# Patient Record
Sex: Female | Born: 1955 | Race: Black or African American | Hispanic: No | Marital: Married | Smoking: Current every day smoker
Health system: Southern US, Community
[De-identification: ages and names within clinical notes are randomized; demographics above are authoritative.]

## PROBLEM LIST (undated history)

## (undated) DIAGNOSIS — F17200 Nicotine dependence, unspecified, uncomplicated: Secondary | ICD-10-CM

## (undated) DIAGNOSIS — D649 Anemia, unspecified: Secondary | ICD-10-CM

## (undated) DIAGNOSIS — C3490 Malignant neoplasm of unspecified part of unspecified bronchus or lung: Secondary | ICD-10-CM

## (undated) DIAGNOSIS — J984 Other disorders of lung: Secondary | ICD-10-CM

## (undated) DIAGNOSIS — E871 Hypo-osmolality and hyponatremia: Secondary | ICD-10-CM

## (undated) DIAGNOSIS — I671 Cerebral aneurysm, nonruptured: Secondary | ICD-10-CM

## (undated) DIAGNOSIS — C801 Malignant (primary) neoplasm, unspecified: Secondary | ICD-10-CM

## (undated) DIAGNOSIS — I10 Essential (primary) hypertension: Secondary | ICD-10-CM

## (undated) DIAGNOSIS — N289 Disorder of kidney and ureter, unspecified: Secondary | ICD-10-CM

## (undated) DIAGNOSIS — B370 Candidal stomatitis: Secondary | ICD-10-CM

## (undated) DIAGNOSIS — R29898 Other symptoms and signs involving the musculoskeletal system: Secondary | ICD-10-CM

## (undated) DIAGNOSIS — L98499 Non-pressure chronic ulcer of skin of other sites with unspecified severity: Secondary | ICD-10-CM

## (undated) DIAGNOSIS — M25562 Pain in left knee: Secondary | ICD-10-CM

## (undated) DIAGNOSIS — Z9889 Other specified postprocedural states: Secondary | ICD-10-CM

## (undated) DIAGNOSIS — R197 Diarrhea, unspecified: Secondary | ICD-10-CM

## (undated) DIAGNOSIS — J439 Emphysema, unspecified: Secondary | ICD-10-CM

## (undated) DIAGNOSIS — L511 Stevens-Johnson syndrome: Secondary | ICD-10-CM

## (undated) DIAGNOSIS — M542 Cervicalgia: Secondary | ICD-10-CM

## (undated) DIAGNOSIS — M889 Osteitis deformans of unspecified bone: Secondary | ICD-10-CM

## (undated) DIAGNOSIS — G8929 Other chronic pain: Secondary | ICD-10-CM

## (undated) HISTORY — PX: CHEST TUBE INSERTION: SHX231

## (undated) HISTORY — DX: Nicotine dependence, unspecified, uncomplicated: F17.200

## (undated) HISTORY — PX: TUBAL LIGATION: SHX77

## (undated) HISTORY — PX: COLONOSCOPY: SHX174

## (undated) HISTORY — PX: CEREBRAL ANEURYSM REPAIR: SHX164

## (undated) HISTORY — DX: Candidal stomatitis: B37.0

## (undated) HISTORY — DX: Non-pressure chronic ulcer of skin of other sites with unspecified severity: L98.499

## (undated) HISTORY — DX: Malignant (primary) neoplasm, unspecified: C80.1

## (undated) HISTORY — DX: Emphysema, unspecified: J43.9

## (undated) HISTORY — DX: Other chronic pain: G89.29

## (undated) HISTORY — DX: Cervicalgia: M54.2

---

## 2003-08-29 ENCOUNTER — Emergency Department (HOSPITAL_COMMUNITY): Admission: EM | Admit: 2003-08-29 | Discharge: 2003-08-29 | Payer: Self-pay | Admitting: Emergency Medicine

## 2004-08-31 ENCOUNTER — Emergency Department (HOSPITAL_COMMUNITY): Admission: EM | Admit: 2004-08-31 | Discharge: 2004-09-01 | Payer: Self-pay | Admitting: Emergency Medicine

## 2007-12-16 ENCOUNTER — Emergency Department (HOSPITAL_COMMUNITY): Admission: EM | Admit: 2007-12-16 | Discharge: 2007-12-17 | Payer: Self-pay | Admitting: Emergency Medicine

## 2008-08-31 ENCOUNTER — Emergency Department (HOSPITAL_COMMUNITY): Admission: EM | Admit: 2008-08-31 | Discharge: 2008-08-31 | Payer: Self-pay | Admitting: Emergency Medicine

## 2009-02-22 ENCOUNTER — Emergency Department (HOSPITAL_COMMUNITY): Admission: EM | Admit: 2009-02-22 | Discharge: 2009-02-22 | Payer: Self-pay | Admitting: Emergency Medicine

## 2009-08-29 ENCOUNTER — Emergency Department (HOSPITAL_COMMUNITY): Admission: EM | Admit: 2009-08-29 | Discharge: 2009-08-29 | Payer: Self-pay | Admitting: Emergency Medicine

## 2010-04-08 LAB — URINE MICROSCOPIC-ADD ON

## 2010-04-08 LAB — URINALYSIS, ROUTINE W REFLEX MICROSCOPIC
Glucose, UA: NEGATIVE mg/dL
Ketones, ur: NEGATIVE mg/dL
Specific Gravity, Urine: >1.03 — ABNORMAL HIGH (ref 1.005–1.030)
Urobilinogen, UA: 0.2 mg/dL (ref 0.0–1.0)

## 2010-04-30 LAB — URINALYSIS, ROUTINE W REFLEX MICROSCOPIC
Bilirubin Urine: NEGATIVE
Hgb urine dipstick: NEGATIVE
Nitrite: NEGATIVE
Protein, ur: NEGATIVE mg/dL
Specific Gravity, Urine: >1.03 — ABNORMAL HIGH (ref 1.005–1.030)
pH: 5.5 (ref 5.0–8.0)

## 2010-04-30 LAB — URINE MICROSCOPIC-ADD ON

## 2010-04-30 LAB — WET PREP, GENITAL: Yeast Wet Prep HPF POC: NONE SEEN

## 2010-04-30 LAB — GC/CHLAMYDIA PROBE AMP, GENITAL: GC Probe Amp, Genital: NEGATIVE

## 2010-09-06 ENCOUNTER — Emergency Department (HOSPITAL_COMMUNITY)
Admission: EM | Admit: 2010-09-06 | Discharge: 2010-09-06 | Disposition: A | Discharge status: Home / Self Care | Payer: Medicaid Other | Attending: Emergency Medicine | Admitting: Emergency Medicine

## 2010-09-06 ENCOUNTER — Emergency Department (HOSPITAL_COMMUNITY): Payer: Medicaid Other

## 2010-09-06 ENCOUNTER — Encounter: Payer: Self-pay | Admitting: *Deleted

## 2010-09-06 DIAGNOSIS — R51 Headache: Secondary | ICD-10-CM | POA: Insufficient documentation

## 2010-09-06 DIAGNOSIS — I10 Essential (primary) hypertension: Secondary | ICD-10-CM | POA: Insufficient documentation

## 2010-09-06 HISTORY — DX: Essential (primary) hypertension: I10

## 2010-09-06 HISTORY — DX: Cerebral aneurysm, nonruptured: I67.1

## 2010-09-06 HISTORY — DX: Other disorders of lung: J98.4

## 2010-09-06 MED ORDER — KETOROLAC TROMETHAMINE 60 MG/2ML IM SOLN
60.0000 mg | Freq: Once | INTRAMUSCULAR | Status: AC
  Administered 2010-09-06: 60 mg via INTRAMUSCULAR
  Filled 2010-09-06: qty 2

## 2010-09-06 MED ORDER — HYDROCODONE-ACETAMINOPHEN 5-325 MG PO TABS: 1.0000 | ORAL_TABLET | ORAL | Status: AC | PRN

## 2010-09-06 MED ORDER — ONDANSETRON HCL 4 MG PO TABS: 4.0000 mg | ORAL_TABLET | Freq: Four times a day (QID) | ORAL | Status: AC

## 2010-09-06 NOTE — ED Notes (Signed)
Crackers and drink given to pt 

## 2010-09-06 NOTE — ED Notes (Signed)
Discharge instructions given and reviewed with patient.  Prescriptions given for Zofran and Hydrocodone; effects and use explained.  Patient verbalized understanding of sedating effects of Hydrocodone.  Patient ambulatory; discharged home in good condition.

## 2010-09-06 NOTE — ED Notes (Signed)
Patient ambulatory to bathroom with steady gait.  States she feels a little better.

## 2010-09-06 NOTE — ED Provider Notes (Signed)
History  Scribed for Dr. Adriana Simas, the patient was seen in room 17. This chart was scribed by Hillery Hunter. This patient's care was started at 17:00.   CSN: [id number] Arrival date & time: 09/06/2010  4:23 PM  Chief Complaint  Patient presents with  . Migraine  . Hypertension   The history is provided by the patient.   Patient is a 55 year old female who presents to the ED with a right parietal headache. Patient describes the pain as waxing and waning and radiating diffusely around head. Patient reports that the headaches began after hitting her head when she got out of her car one month ago. The patient reports blurred vision, and occasional new difficultly walking but denies photophobia and nausea. Patient is taking baby aspirin at home for pain with mild improvement. Patients fiance reports that patient is behaving normally. PCP: [facility]  HPI ELEMENTS:  Location: right parietal  Onset: one month ago   Quality: shooting   Modifying factors: mild transient relief with ASA 81mg   Context: as above  Associated symptoms: as above   Past Medical History  Diagnosis Date  . Migraine   . Lung abnormality   . Hypertension   . Pleurisy   . Cerebral aneurysm 2 brain surgeries 96 or 97    Past Surgical History  Procedure Date  . Chest tube insertion   . Tubal ligation   . Cerebral aneurysm repair 96 or 97    History reviewed. No pertinent family history.  History  Substance Use Topics  . Smoking status: Current Everyday Smoker -- 0.5 packs/day  . Smokeless tobacco: Not on file  . Alcohol Use: No    Review of Systems  Constitutional: Negative for activity change.  Eyes: Positive for visual disturbance (blurry). Negative for photophobia.  Respiratory: Negative for shortness of breath.   Gastrointestinal: Negative for nausea and vomiting.  Musculoskeletal: Positive for gait problem.  Neurological: Negative for dizziness, syncope, facial asymmetry, speech  difficulty, weakness and numbness.  Psychiatric/Behavioral: Negative for behavioral problems and confusion.  All other systems reviewed and are negative.    Physical Exam  BP 194/100  Pulse 90  Temp(Src) 97.7 F (36.5 C) (Oral)  Resp 20  Ht 5' 1.5" (1.562 m)  Wt 149 lb (67.586 kg)  BMI 27.70 kg/m2  SpO2 100%  Physical Exam  Nursing note and vitals reviewed. Constitutional: She is oriented to person, place, and time. She appears well-developed and well-nourished. No distress.  HENT:  Head: Normocephalic.       Slightly sore in right parietal scalp   Eyes: EOM are normal. Pupils are equal, round, and reactive to light.  Neck: Neck supple.       Minimal cervical tenderness   Cardiovascular: Normal rate, regular rhythm, normal heart sounds and intact distal pulses.   Pulmonary/Chest: Effort normal and breath sounds normal.  Neurological: She is alert and oriented to person, place, and time. No cranial nerve deficit. Coordination normal.       Patient ambulated without gait difficulty and without light headedness   Skin: Skin is warm and dry.  Psychiatric: She has a normal mood and affect. Her behavior is normal. Thought content normal.    ED Course  Procedures   OTHER DATA REVIEWED: Nursing notes, vital signs reviewed.   DIAGNOSTIC STUDIES: Oxygen Saturation is 100% on room air, normal by my interpretation.     LABS / RADIOLOGY:   CT Brain: IMPRESSION:  1. Bitemporal craniotomies with mild prominence of  fluid density anteriorly in the middle cranial fossa bilaterally possibly reflecting mild encephalomalacia, and a suspected small remote infarct along the inferior margin of the head of the right caudate nucleus. Bilateral aneurysm clips are present. 2. No acute intracranial findings are observed.  Original Report Authenticated By: Dellia Cloud, M.D.   ED COURSE / COORDINATION OF CARE: 17:15. Ordered Toradol 60mg  IM for pain and CT Brain 19:24.  Patient ambulatory to bathroom with steady gait. States she feels a little better.  MDM:  I informed patient that I am not greatly concerned of intracranial findings considering her history and physical but due to the quality of these headaches being unusual for her, she requests a CT scan anyway. I anticipate discharge after patient shows improvement of pain with analgesic.   IMPRESSION: Diagnoses that have been ruled out:  Diagnoses that are still under consideration:  Final diagnoses:  Headache    PLAN:  Discharge home The patient is to return the emergency department if there is any worsening of symptoms. I have reviewed the discharge instructions with the patient   CONDITION ON DISCHARGE: Good   MEDICATIONS GIVEN IN THE E.D.  Medications  ketorolac (TORADOL) injection 60 mg (60 mg Intramuscular Given 09/06/10 1725)     DISCHARGE MEDICATIONS: New Prescriptions   HYDROCODONE-ACETAMINOPHEN (NORCO) 5-325 MG PER TABLET    Take 1-2 tablets by mouth every 4 (four) hours as needed for pain.   ONDANSETRON (ZOFRAN) 4 MG TABLET    Take 1 tablet (4 mg total) by mouth every 6 (six) hours.    Scribe Attestation  I personally performed the services described in this documentation, which was scribed in my presence. The recorded information has been reviewed and considered. No att. providers found    Donnetta Hutching, MD 09/06/10 2201

## 2010-09-06 NOTE — ED Notes (Signed)
Dr. Adriana Simas notified of bp and that pt still rates pain at 9.  Says is going to reevaluate pt.

## 2010-09-06 NOTE — ED Notes (Signed)
Pt states she has had headache for a long time and today worse. Pt states she has pain to right side of head and feels like it is behind right eye.

## 2012-03-14 ENCOUNTER — Emergency Department (HOSPITAL_COMMUNITY): Payer: Medicaid Other

## 2012-03-14 ENCOUNTER — Emergency Department (HOSPITAL_COMMUNITY)
Admission: EM | Admit: 2012-03-14 | Discharge: 2012-03-15 | Disposition: A | Discharge status: Home / Self Care | Payer: Medicaid Other | Attending: Emergency Medicine | Admitting: Emergency Medicine

## 2012-03-14 ENCOUNTER — Encounter (HOSPITAL_COMMUNITY): Payer: Self-pay | Admitting: Emergency Medicine

## 2012-03-14 DIAGNOSIS — R42 Dizziness and giddiness: Secondary | ICD-10-CM | POA: Insufficient documentation

## 2012-03-14 DIAGNOSIS — Z91199 Patient's noncompliance with other medical treatment and regimen due to unspecified reason: Secondary | ICD-10-CM | POA: Insufficient documentation

## 2012-03-14 DIAGNOSIS — R0602 Shortness of breath: Secondary | ICD-10-CM | POA: Insufficient documentation

## 2012-03-14 DIAGNOSIS — R110 Nausea: Secondary | ICD-10-CM | POA: Insufficient documentation

## 2012-03-14 DIAGNOSIS — K429 Umbilical hernia without obstruction or gangrene: Secondary | ICD-10-CM | POA: Insufficient documentation

## 2012-03-14 DIAGNOSIS — R51 Headache: Secondary | ICD-10-CM | POA: Insufficient documentation

## 2012-03-14 DIAGNOSIS — Z8739 Personal history of other diseases of the musculoskeletal system and connective tissue: Secondary | ICD-10-CM | POA: Insufficient documentation

## 2012-03-14 DIAGNOSIS — Z8709 Personal history of other diseases of the respiratory system: Secondary | ICD-10-CM | POA: Insufficient documentation

## 2012-03-14 DIAGNOSIS — I10 Essential (primary) hypertension: Secondary | ICD-10-CM | POA: Insufficient documentation

## 2012-03-14 DIAGNOSIS — F17200 Nicotine dependence, unspecified, uncomplicated: Secondary | ICD-10-CM | POA: Insufficient documentation

## 2012-03-14 HISTORY — DX: Osteitis deformans of unspecified bone: M88.9

## 2012-03-14 LAB — COMPREHENSIVE METABOLIC PANEL
ALT: 8 U/L (ref 0–35)
AST: 14 U/L (ref 0–37)
Albumin: 3.5 g/dL (ref 3.5–5.2)
Alkaline Phosphatase: 299 U/L — ABNORMAL HIGH (ref 39–117)
BUN: 16 mg/dL (ref 6–23)
CO2: 28 mEq/L (ref 19–32)
Calcium: 9 mg/dL (ref 8.4–10.5)
Chloride: 101 mEq/L (ref 96–112)
Glucose, Bld: 93 mg/dL (ref 70–99)
Total Bilirubin: 0.2 mg/dL — ABNORMAL LOW (ref 0.3–1.2)

## 2012-03-14 LAB — CBC WITH DIFFERENTIAL/PLATELET
Basophils Absolute: 0 10*3/uL (ref 0.0–0.1)
Eosinophils Absolute: 0.2 10*3/uL (ref 0.0–0.7)
Lymphs Abs: 3.4 10*3/uL (ref 0.7–4.0)
MCH: 30.6 pg (ref 26.0–34.0)
MCV: 90 fL (ref 78.0–100.0)
Monocytes Absolute: 0.7 10*3/uL (ref 0.1–1.0)
Neutrophils Relative %: 65 % (ref 43–77)
Platelets: 222 10*3/uL (ref 150–400)

## 2012-03-14 MED ORDER — SODIUM CHLORIDE 0.9 % IV BOLUS (SEPSIS)
1000.0000 mL | Freq: Once | INTRAVENOUS | Status: AC
  Administered 2012-03-14: 1000 mL via INTRAVENOUS

## 2012-03-14 MED ORDER — SODIUM CHLORIDE 0.9 % IV SOLN
Freq: Once | INTRAVENOUS | Status: AC
  Administered 2012-03-14: 20 mL/h via INTRAVENOUS

## 2012-03-14 MED ORDER — LISINOPRIL 20 MG PO TABS: 20.0000 mg | ORAL_TABLET | Freq: Every day | ORAL | Status: DC

## 2012-03-14 MED ORDER — ONDANSETRON HCL 4 MG/2ML IJ SOLN
4.0000 mg | Freq: Once | INTRAMUSCULAR | Status: AC
  Administered 2012-03-14: 4 mg via INTRAVENOUS
  Filled 2012-03-14: qty 2

## 2012-03-14 MED ORDER — HYDROMORPHONE HCL PF 1 MG/ML IJ SOLN
1.0000 mg | Freq: Once | INTRAMUSCULAR | Status: AC
  Administered 2012-03-14: 1 mg via INTRAVENOUS
  Filled 2012-03-14: qty 1

## 2012-03-14 MED ORDER — HYDROMORPHONE HCL PF 1 MG/ML IJ SOLN
0.5000 mg | Freq: Once | INTRAMUSCULAR | Status: AC
Start: 2012-03-14 — End: 2012-03-14
  Administered 2012-03-14: 0.5 mg via INTRAVENOUS
  Filled 2012-03-14: qty 1

## 2012-03-14 MED ORDER — LISINOPRIL 10 MG PO TABS
20.0000 mg | ORAL_TABLET | Freq: Once | ORAL | Status: AC
  Administered 2012-03-14: 20 mg via ORAL
  Filled 2012-03-14: qty 2

## 2012-03-14 MED ORDER — HYDROCODONE-ACETAMINOPHEN 5-325 MG PO TABS: 1.0000 | ORAL_TABLET | Freq: Four times a day (QID) | ORAL | Status: DC | PRN

## 2012-03-14 NOTE — ED Notes (Signed)
Ambulated to bathroom and then nauseated and vomiting again- identified pill particles in emesis.  Still c/o feeling very dizzy and has fele this way since this afternoon when she called EMS

## 2012-03-14 NOTE — ED Provider Notes (Signed)
History     CSN: [id number]  Arrival date & time 03/14/12  1949   First MD Initiated Contact with Patient 03/14/12 1952      Chief Complaint  Patient presents with  . Abdominal Pain  . Shortness of Breath    (Consider location/radiation/quality/duration/timing/severity/associated sxs/prior treatment) HPI  Past Medical History  Diagnosis Date  . Migraine   . Lung abnormality   . Hypertension   . Pleurisy   . Cerebral aneurysm 2 brain surgeries 96 or 97  . Paget disease of bone     Past Surgical History  Procedure Laterality Date  . Chest tube insertion    . Tubal ligation    . Cerebral aneurysm repair  96 or 97    History reviewed. No pertinent family history.  History  Substance Use Topics  . Smoking status: Current Every Day Smoker -- 0.50 packs/day  . Smokeless tobacco: Not on file  . Alcohol Use: No    OB History   Grav Para Term Preterm Abortions TAB SAB Ect Mult Living                  Review of Systems  Allergies  Review of patient's allergies indicates no known allergies.  Home Medications   Current Outpatient Rx  Name  Route  Sig  Dispense  Refill  . acetaminophen (TYLENOL) 500 MG tablet   Oral   Take 500 mg by mouth every 6 (six) hours as needed for pain.         Marland Kitchen HYDROcodone-acetaminophen (NORCO/VICODIN) 5-325 MG per tablet   Oral   Take 1 tablet by mouth every 6 (six) hours as needed for pain.   10 tablet   0   . lisinopril (PRINIVIL,ZESTRIL) 20 MG tablet   Oral   Take 1 tablet (20 mg total) by mouth daily.   30 tablet   0     BP 187/81  Pulse 72  Temp(Src) 98.2 F (36.8 C) (Oral)  Resp 16  Ht 5\' 1"  (1.549 m)  Wt 140 lb (63.504 kg)  BMI 26.47 kg/m2  SpO2 96%  Physical Exam  ED Course  Procedures (including critical care time)  Labs Reviewed  CBC WITH DIFFERENTIAL - Abnormal; Notable for the following:    WBC 12.2 (*)    RDW 16.1 (*)    Neutro Abs 7.9 (*)    All other components within normal limits   COMPREHENSIVE METABOLIC PANEL - Abnormal; Notable for the following:    Alkaline Phosphatase 299 (*)    Total Bilirubin 0.2 (*)    GFR calc non Af Amer 58 (*)    GFR calc Af Amer 68 (*)    All other components within normal limits   Dg Chest 2 View  03/14/2012  *RADIOLOGY REPORT*  Clinical Data: Shortness of breath with headache and hypertension.  CHEST - 2 VIEW  Comparison: None.  Findings: Cardiomegaly.  Calcified tortuous aorta.  No hilar or mediastinal contour abnormalities.  Clear lung fields.  No effusion or pneumothorax. Unusual appearance to T11 and L1 vertebrae with sclerosis, flattening, but no destruction, suggesting Paget's disease.  IMPRESSION: No active cardiopulmonary disease.  Cardiomegaly.  Probable Paget's disease.   Original Report Authenticated By: Davonna Belling, M.D.    Ct Head Wo Contrast  03/14/2012  *RADIOLOGY REPORT*  Clinical Data: Shortness of breath and dizziness.  History of cerebral aneurysm with two surgeries.  CT HEAD WITHOUT CONTRAST  Technique:  Contiguous axial images were obtained from the  base of the skull through the vertex without contrast.  Comparison: 09/06/2010.  Findings: There is no evidence for acute infarction, intracranial hemorrhage, mass lesion, hydrocephalus, or extra-axial fluid. Mild atrophy.  Remote bilateral caudate infarcts.  Encephalomalacia both temporal lobes.  Bilateral internal carotid artery aneurysm clips. Mild chronic microvascular ischemic change.  No skull fracture.  No acute sinus or mastoid disease.  Compared to 2012, a similar appearance is noted.  IMPRESSION: No acute findings.  No evidence for new subarachnoid hemorrhage or acute infarction.  Chronic changes as described related to bilateral aneurysm clipping and multiple old infarcts.   Original Report Authenticated By: Davonna Belling, M.D.      1. Headache   2. Hypertension       MDM         Benny Lennert, MD 03/14/12 2250

## 2012-03-14 NOTE — ED Notes (Signed)
Attempted to sit patient up - still having nausea.

## 2012-03-14 NOTE — ED Notes (Signed)
Has vomited twice in past 20 minutes.  No abdominal pain now - just nausea

## 2012-03-14 NOTE — ED Notes (Signed)
C/o dizziness at present, and now c/o left arm numbness

## 2012-03-14 NOTE — ED Notes (Signed)
MD at bedside. Dr. Zammit. 

## 2012-03-14 NOTE — ED Notes (Addendum)
Mild itching on both arms.  Patient thinks it is due to tape on her arm and BP cuff

## 2012-03-14 NOTE — ED Provider Notes (Signed)
History    This chart was scribed for Benny Lennert, MD by Gerlean Ren, ED Scribe. This patient was seen in room APA16A/APA16A and the patient's care was started at 7:56 PM    CSN: [id number]  Arrival date & time 03/14/12  1949   First MD Initiated Contact with Patient 03/14/12 1952      Chief Complaint  Patient presents with  . Abdominal Pain  . Shortness of Breath     The history is provided by the patient. No language interpreter was used.  Megan Sims is a 57 y.o. female with h/o HTN and cerebral anyeurism (1996, 1997) brought in by ambulance to the Emergency Department complaining of constant dizziness and nausea with sudden onset around 6:00 PM tonight during rest.  Associated shortness of breath.  Per EMS, pt's BP was 220/140 en route here.  Pt is supposed to be on BP medication but has not taken it in over a year because it "made her sick."  Pt also c/o a knot in her lower abdomen causing abdominal pain.   Pt is a current everyday smoker but denies alcohol use.   Past Medical History  Diagnosis Date  . Migraine   . Lung abnormality   . Hypertension   . Pleurisy   . Cerebral aneurysm 2 brain surgeries 96 or 97  . Paget disease of bone     Past Surgical History  Procedure Laterality Date  . Chest tube insertion    . Tubal ligation    . Cerebral aneurysm repair  96 or 97    History reviewed. No pertinent family history.  History  Substance Use Topics  . Smoking status: Current Every Day Smoker -- 0.50 packs/day  . Smokeless tobacco: Not on file  . Alcohol Use: No    No OB history provided.   Review of Systems  HENT: Negative for congestion, sinus pressure and ear discharge.   Eyes: Negative for discharge.  Respiratory: Positive for shortness of breath. Negative for cough.   Cardiovascular: Negative.        Hypertensive  Gastrointestinal: Positive for nausea and abdominal pain. Negative for vomiting and diarrhea.  Genitourinary: Negative for frequency and  hematuria.  Musculoskeletal: Negative for back pain.  Skin: Negative for rash.  Neurological: Positive for dizziness. Negative for seizures and headaches.  Psychiatric/Behavioral: Negative for hallucinations.    Allergies  Review of patient's allergies indicates no known allergies.  Home Medications   Current Outpatient Rx  Name  Route  Sig  Dispense  Refill  . aspirin EC 81 MG tablet   Oral   Take 162 mg by mouth once as needed. For pain            BP 198/98  Pulse 72  Temp(Src) 98.2 F (36.8 C) (Oral)  Ht 5\' 1"  (1.549 m)  Wt 140 lb (63.504 kg)  BMI 26.47 kg/m2  SpO2 98%  Physical Exam  Nursing note and vitals reviewed. Constitutional: She is oriented to person, place, and time. She appears well-developed.  HENT:  Head: Normocephalic and atraumatic.  Eyes: Conjunctivae and EOM are normal. No scleral icterus.  Neck: Neck supple. No thyromegaly present.  Cardiovascular: Normal rate and regular rhythm.  Exam reveals no gallop and no friction rub.   No murmur heard. Pulmonary/Chest: No stridor. She has no wheezes. She has no rales. She exhibits no tenderness.  Abdominal: Soft. She exhibits no distension. There is no tenderness. There is no rebound.  Small umbilical hernia  Musculoskeletal: Normal range of motion. She exhibits no edema.  Lymphadenopathy:    She has no cervical adenopathy.  Neurological: She is oriented to person, place, and time. Coordination normal.  Skin: No rash noted. No erythema.  Psychiatric: She has a normal mood and affect. Her behavior is normal.    ED Course  Procedures (including critical care time) DIAGNOSTIC STUDIES: Oxygen Saturation is 98% on room air, normal by my interpretation.    COORDINATION OF CARE: 7:59 PM- Patient informed of clinical course, understands medical decision-making process, and agrees with plan.  Ordered head CT w/o contrast, c-met, and CBC.  Results for orders placed during the hospital encounter of 03/14/12   CBC WITH DIFFERENTIAL      Result Value Range   WBC 12.2 (*) 4.0 - 10.5 K/uL   RBC 4.51  3.87 - 5.11 MIL/uL   Hemoglobin 13.8  12.0 - 15.0 g/dL   HCT 16.1  09.6 - 04.5 %   MCV 90.0  78.0 - 100.0 fL   MCH 30.6  26.0 - 34.0 pg   MCHC 34.0  30.0 - 36.0 g/dL   RDW 40.9 (*) 81.1 - 91.4 %   Platelets 222  150 - 400 K/uL   Neutrophils Relative 65  43 - 77 %   Neutro Abs 7.9 (*) 1.7 - 7.7 K/uL   Lymphocytes Relative 28  12 - 46 %   Lymphs Abs 3.4  0.7 - 4.0 K/uL   Monocytes Relative 6  3 - 12 %   Monocytes Absolute 0.7  0.1 - 1.0 K/uL   Eosinophils Relative 1  0 - 5 %   Eosinophils Absolute 0.2  0.0 - 0.7 K/uL   Basophils Relative 0  0 - 1 %   Basophils Absolute 0.0  0.0 - 0.1 K/uL  COMPREHENSIVE METABOLIC PANEL      Result Value Range   Sodium 137  135 - 145 mEq/L   Potassium 3.6  3.5 - 5.1 mEq/L   Chloride 101  96 - 112 mEq/L   CO2 28  19 - 32 mEq/L   Glucose, Bld 93  70 - 99 mg/dL   BUN 16  6 - 23 mg/dL   Creatinine, Ser 7.82  0.50 - 1.10 mg/dL   Calcium 9.0  8.4 - 95.6 mg/dL   Total Protein 7.0  6.0 - 8.3 g/dL   Albumin 3.5  3.5 - 5.2 g/dL   AST 14  0 - 37 U/L   ALT 8  0 - 35 U/L   Alkaline Phosphatase 299 (*) 39 - 117 U/L   Total Bilirubin 0.2 (*) 0.3 - 1.2 mg/dL   GFR calc non Af Amer 58 (*) >90 mL/min   GFR calc Af Amer 68 (*) >90 mL/min    Dg Chest 2 View  03/14/2012  *RADIOLOGY REPORT*  Clinical Data: Shortness of breath with headache and hypertension.  CHEST - 2 VIEW  Comparison: None.  Findings: Cardiomegaly.  Calcified tortuous aorta.  No hilar or mediastinal contour abnormalities.  Clear lung fields.  No effusion or pneumothorax. Unusual appearance to T11 and L1 vertebrae with sclerosis, flattening, but no destruction, suggesting Paget's disease.  IMPRESSION: No active cardiopulmonary disease.  Cardiomegaly.  Probable Paget's disease.   Original Report Authenticated By: Davonna Belling, M.D.    Ct Head Wo Contrast  03/14/2012  *RADIOLOGY REPORT*  Clinical Data:  Shortness of breath and dizziness.  History of cerebral aneurysm with two surgeries.  CT HEAD WITHOUT CONTRAST  Technique:  Contiguous axial images were obtained from the base of the skull through the vertex without contrast.  Comparison: 09/06/2010.  Findings: There is no evidence for acute infarction, intracranial hemorrhage, mass lesion, hydrocephalus, or extra-axial fluid. Mild atrophy.  Remote bilateral caudate infarcts.  Encephalomalacia both temporal lobes.  Bilateral internal carotid artery aneurysm clips. Mild chronic microvascular ischemic change.  No skull fracture.  No acute sinus or mastoid disease.  Compared to 2012, a similar appearance is noted.  IMPRESSION: No acute findings.  No evidence for new subarachnoid hemorrhage or acute infarction.  Chronic changes as described related to bilateral aneurysm clipping and multiple old infarcts.   Original Report Authenticated By: Davonna Belling, M.D.      No diagnosis found.    MDM    The chart was scribed for me under my direct supervision.  I personally performed the history, physical, and medical decision making and all procedures in the evaluation of this patient.Benny Lennert, MD 03/26/12 [phone]

## 2012-03-14 NOTE — ED Notes (Signed)
Patient complaining of abdominal pain and shortness of breath starting today. Per EMS patient b/p 220/140. Patient states she is supposed to be on blood pressure medications but hasn't taken any in over 1 yr.

## 2012-03-15 NOTE — ED Notes (Addendum)
Dr. Colon Branch In to talk with patient

## 2012-03-15 NOTE — ED Notes (Signed)
Family members sitting in room with patient.  Uncomfortable taking patient home at present.

## 2012-03-15 NOTE — ED Notes (Signed)
Home with son and daughter.  They state they will bring her back if she continues to be dizzy and vomiting.

## 2012-07-13 ENCOUNTER — Emergency Department (HOSPITAL_COMMUNITY)
Admission: EM | Admit: 2012-07-13 | Discharge: 2012-07-13 | Disposition: A | Discharge status: Home / Self Care | Payer: Medicaid Other | Attending: Emergency Medicine | Admitting: Emergency Medicine

## 2012-07-13 ENCOUNTER — Encounter (HOSPITAL_COMMUNITY): Payer: Self-pay | Admitting: *Deleted

## 2012-07-13 DIAGNOSIS — F17200 Nicotine dependence, unspecified, uncomplicated: Secondary | ICD-10-CM | POA: Insufficient documentation

## 2012-07-13 DIAGNOSIS — Z23 Encounter for immunization: Secondary | ICD-10-CM | POA: Insufficient documentation

## 2012-07-13 DIAGNOSIS — Z8709 Personal history of other diseases of the respiratory system: Secondary | ICD-10-CM | POA: Insufficient documentation

## 2012-07-13 DIAGNOSIS — I10 Essential (primary) hypertension: Secondary | ICD-10-CM | POA: Insufficient documentation

## 2012-07-13 DIAGNOSIS — Z8679 Personal history of other diseases of the circulatory system: Secondary | ICD-10-CM | POA: Insufficient documentation

## 2012-07-13 DIAGNOSIS — Y929 Unspecified place or not applicable: Secondary | ICD-10-CM | POA: Insufficient documentation

## 2012-07-13 DIAGNOSIS — Z8739 Personal history of other diseases of the musculoskeletal system and connective tissue: Secondary | ICD-10-CM | POA: Insufficient documentation

## 2012-07-13 DIAGNOSIS — W268XXA Contact with other sharp object(s), not elsewhere classified, initial encounter: Secondary | ICD-10-CM | POA: Insufficient documentation

## 2012-07-13 DIAGNOSIS — Y939 Activity, unspecified: Secondary | ICD-10-CM | POA: Insufficient documentation

## 2012-07-13 DIAGNOSIS — S91309A Unspecified open wound, unspecified foot, initial encounter: Secondary | ICD-10-CM | POA: Insufficient documentation

## 2012-07-13 DIAGNOSIS — L02619 Cutaneous abscess of unspecified foot: Secondary | ICD-10-CM | POA: Insufficient documentation

## 2012-07-13 DIAGNOSIS — Z79899 Other long term (current) drug therapy: Secondary | ICD-10-CM | POA: Insufficient documentation

## 2012-07-13 MED ORDER — LIDOCAINE HCL (PF) 1 % IJ SOLN
INTRAMUSCULAR | Status: AC
  Administered 2012-07-13: 5 mL
  Filled 2012-07-13: qty 5

## 2012-07-13 MED ORDER — TETANUS-DIPHTH-ACELL PERTUSSIS 5-2.5-18.5 LF-MCG/0.5 IM SUSP
0.5000 mL | Freq: Once | INTRAMUSCULAR | Status: AC
  Administered 2012-07-13: 0.5 mL via INTRAMUSCULAR
  Filled 2012-07-13 (×2): qty 0.5

## 2012-07-13 MED ORDER — LIDOCAINE HCL (PF) 1 % IJ SOLN
30.0000 mL | Freq: Once | INTRAMUSCULAR | Status: AC
  Administered 2012-07-13: 5 mL
  Filled 2012-07-13: qty 30

## 2012-07-13 MED ORDER — SULFAMETHOXAZOLE-TMP DS 800-160 MG PO TABS
1.0000 | ORAL_TABLET | Freq: Once | ORAL | Status: AC
  Administered 2012-07-13: 1 via ORAL
  Filled 2012-07-13: qty 1

## 2012-07-13 MED ORDER — SULFAMETHOXAZOLE-TMP DS 800-160 MG PO TABS: 1.0000 | ORAL_TABLET | Freq: Two times a day (BID) | ORAL | Status: DC

## 2012-07-13 NOTE — ED Notes (Signed)
Stepped on nail x 2 wks ago, c/o pain.  Last tetnus >10 yrs.

## 2012-07-13 NOTE — ED Provider Notes (Signed)
History    This chart was scribed for Lyanne Co, MD, by Frederik Pear, ED scribe. The patient was seen in room APA11/APA11 and the patient's care was started at 0754.    CSN: [id number]  Arrival date & time 07/13/12  4098   First MD Initiated Contact with Patient 07/13/12 [phone]      Chief Complaint  Patient presents with  . Foot Pain    (Consider location/radiation/quality/duration/timing/severity/associated sxs/prior treatment) The history is provided by the patient and medical records. No language interpreter was used.   HPI Comments: Megan Sims is a 57 y.o. female with a h/o of hypertension who presents to the Emergency Department complaining of constant, gradually worsening, moderate right foot pain that is aggravated with ambulation and alleviated by nothing that began suddenly two weeks ago when she stepped a nail. She has She reports that her tetanus has not been updated within the last 10 years. She states that she has not taken her daily dose of lisinopril yet today.   Past Medical History  Diagnosis Date  . Migraine   . Lung abnormality   . Hypertension   . Pleurisy   . Cerebral aneurysm 2 brain surgeries 96 or 97  . Paget disease of bone     Past Surgical History  Procedure Laterality Date  . Chest tube insertion    . Tubal ligation    . Cerebral aneurysm repair  96 or 97    No family history on file.  History  Substance Use Topics  . Smoking status: Current Every Day Smoker -- 0.50 packs/day    Types: Cigarettes  . Smokeless tobacco: Not on file  . Alcohol Use: No    OB History   Grav Para Term Preterm Abortions TAB SAB Ect Mult Living                  Review of Systems A complete 10 system review of systems was obtained and all systems are negative except as noted in the HPI and PMH.  Allergies  Review of patient's allergies indicates no known allergies.  Home Medications   Current Outpatient Rx  Name  Route  Sig  Dispense  Refill  .  acetaminophen (TYLENOL) 500 MG tablet   Oral   Take 500 mg by mouth every 6 (six) hours as needed for pain.         Marland Kitchen HYDROcodone-acetaminophen (NORCO/VICODIN) 5-325 MG per tablet   Oral   Take 1 tablet by mouth every 6 (six) hours as needed for pain.   10 tablet   0   . lisinopril (PRINIVIL,ZESTRIL) 20 MG tablet   Oral   Take 1 tablet (20 mg total) by mouth daily.   30 tablet   0     BP 217/109  Pulse 87  Temp(Src) 98 F (36.7 C) (Oral)  Resp 20  Ht 5\' 1"  (1.549 m)  Wt 135 lb (61.236 kg)  BMI 25.52 kg/m2  SpO2 96%  Physical Exam  Nursing note and vitals reviewed. Constitutional: She is oriented to person, place, and time. She appears well-developed and well-nourished. No distress.  HENT:  Head: Normocephalic and atraumatic.  Eyes: EOM are normal.  Neck: Normal range of motion.  Cardiovascular: Normal rate, regular rhythm and normal heart sounds.   Pulmonary/Chest: Effort normal and breath sounds normal.  Abdominal: Soft. She exhibits no distension. There is no tenderness.  Musculoskeletal: Normal range of motion. She exhibits tenderness.  Neurological: She is alert and  oriented to person, place, and time.  Skin: Skin is warm and dry.  On the ball of the right foot, overlying the mid-second metatarsal, a small area of induration and fluctuance, but no erythema or drainage. Tender on exam.  Psychiatric: She has a normal mood and affect. Judgment normal.    ED Course  Procedures (including critical care time)  DIAGNOSTIC STUDIES: Oxygen Saturation is 96% on room air, normal by my interpretation.    COORDINATION OF CARE:  07:58- Discussed planned course of treatment with the patient, including an I&D and discharging her with a course of antibiotics and warm soaks at home, who is agreeable at this time.    08:01- INCISION AND DRAINAGE Performed by: Dr. Azalia Bilis, MD Consent: Verbal consent obtained. Risks and benefits: risks, benefits and alternatives were  discussed Type: abscess Body area: On the ball of the right foot overlying the second metatarsal Anesthesia: local infiltration Incision was made with a scalpel. Local anesthetic: lidocaine 1% without epinephrine Anesthetic total: 4 cc Complexity: complex Blunt dissection to break up loculations Drainage: purulent Drainage amount: small amount of pus Patient tolerance: Patient tolerated the procedure well with no immediate complications.     Labs Reviewed - No data to display No results found.   1. Abscess of foot       MDM  Small abscess of right foot.  This incision and drainage now.  Nothing to suggest osteomyelitis at this time.  Patient pressure is elevated in the emergency department.  She is without chest pain shortness of breath.  She has not taken her hypertension medications this morning.  She states she'll take these when she returns home.   I personally performed the services described in this documentation, which was scribed in my presence. The recorded information has been reviewed and is accurate.          Lyanne Co, MD 07/13/12 [phone]

## 2012-08-08 ENCOUNTER — Emergency Department (HOSPITAL_COMMUNITY): Payer: Medicaid Other

## 2012-08-08 ENCOUNTER — Emergency Department (HOSPITAL_COMMUNITY)
Admission: EM | Admit: 2012-08-08 | Discharge: 2012-08-08 | Disposition: A | Discharge status: Home / Self Care | Payer: Medicaid Other | Attending: Emergency Medicine | Admitting: Emergency Medicine

## 2012-08-08 ENCOUNTER — Encounter (HOSPITAL_COMMUNITY): Payer: Self-pay | Admitting: *Deleted

## 2012-08-08 DIAGNOSIS — M79609 Pain in unspecified limb: Secondary | ICD-10-CM | POA: Insufficient documentation

## 2012-08-08 DIAGNOSIS — M79671 Pain in right foot: Secondary | ICD-10-CM

## 2012-08-08 DIAGNOSIS — Z8709 Personal history of other diseases of the respiratory system: Secondary | ICD-10-CM | POA: Insufficient documentation

## 2012-08-08 DIAGNOSIS — Z8739 Personal history of other diseases of the musculoskeletal system and connective tissue: Secondary | ICD-10-CM | POA: Insufficient documentation

## 2012-08-08 DIAGNOSIS — I10 Essential (primary) hypertension: Secondary | ICD-10-CM | POA: Insufficient documentation

## 2012-08-08 DIAGNOSIS — F17200 Nicotine dependence, unspecified, uncomplicated: Secondary | ICD-10-CM | POA: Insufficient documentation

## 2012-08-08 DIAGNOSIS — Z8679 Personal history of other diseases of the circulatory system: Secondary | ICD-10-CM | POA: Insufficient documentation

## 2012-08-08 DIAGNOSIS — R197 Diarrhea, unspecified: Secondary | ICD-10-CM | POA: Insufficient documentation

## 2012-08-08 DIAGNOSIS — M2550 Pain in unspecified joint: Secondary | ICD-10-CM | POA: Insufficient documentation

## 2012-08-08 DIAGNOSIS — R51 Headache: Secondary | ICD-10-CM | POA: Insufficient documentation

## 2012-08-08 HISTORY — DX: Diarrhea, unspecified: R19.7

## 2012-08-08 LAB — CBC WITH DIFFERENTIAL/PLATELET
Basophils Absolute: 0 10*3/uL (ref 0.0–0.1)
Eosinophils Relative: 2 % (ref 0–5)
HCT: 42 % (ref 36.0–46.0)
Lymphocytes Relative: 31 % (ref 12–46)
Lymphs Abs: 3.1 10*3/uL (ref 0.7–4.0)
MCV: 91.5 fL (ref 78.0–100.0)
Monocytes Absolute: 0.9 10*3/uL (ref 0.1–1.0)
Neutro Abs: 5.7 10*3/uL (ref 1.7–7.7)
Platelets: 282 10*3/uL (ref 150–400)
RBC: 4.59 MIL/uL (ref 3.87–5.11)
RDW: 15.7 % — ABNORMAL HIGH (ref 11.5–15.5)
WBC: 9.9 10*3/uL (ref 4.0–10.5)

## 2012-08-08 LAB — BASIC METABOLIC PANEL
CO2: 29 mEq/L (ref 19–32)
Calcium: 9.9 mg/dL (ref 8.4–10.5)
Chloride: 102 mEq/L (ref 96–112)
Glucose, Bld: 93 mg/dL (ref 70–99)
Sodium: 137 mEq/L (ref 135–145)

## 2012-08-08 MED ORDER — HYDROCODONE-ACETAMINOPHEN 5-325 MG PO TABS: ORAL_TABLET | ORAL | Status: DC

## 2012-08-08 MED ORDER — MELOXICAM 7.5 MG PO TABS: ORAL_TABLET | ORAL | Status: DC

## 2012-08-08 NOTE — ED Provider Notes (Signed)
History    CSN: [id number] Arrival date & time 08/08/12  0740  First MD Initiated Contact with Patient 08/08/12 0809     Chief Complaint  Patient presents with  . Foot Pain  . Diarrhea   (Consider location/radiation/quality/duration/timing/severity/associated sxs/prior Treatment) Patient is a 57 y.o. female presenting with lower extremity pain and diarrhea. The history is provided by the patient.  Foot Pain This is a recurrent problem. The current episode started 1 to 4 weeks ago. The problem occurs intermittently. The problem has been gradually worsening. Associated symptoms include arthralgias, a change in bowel habit and headaches. Pertinent negatives include no abdominal pain, chest pain, coughing, fever or neck pain. The symptoms are aggravated by standing and walking. She has tried acetaminophen (antibiotics) for the symptoms. The treatment provided no relief.  Diarrhea Quality:  Semi-solid Severity:  Moderate Onset quality:  Gradual Number of episodes:  2 daily Duration:  1 week Timing:  Intermittent Progression:  Worsening Relieved by:  Nothing Associated symptoms: arthralgias and headaches   Associated symptoms: no abdominal pain and no fever    Past Medical History  Diagnosis Date  . Migraine   . Lung abnormality   . Hypertension   . Pleurisy   . Cerebral aneurysm 2 brain surgeries 96 or 97  . Paget disease of bone   . Diarrhea    Past Surgical History  Procedure Laterality Date  . Chest tube insertion    . Tubal ligation    . Cerebral aneurysm repair  96 or 97   History reviewed. No pertinent family history. History  Substance Use Topics  . Smoking status: Current Every Day Smoker -- 0.50 packs/day    Types: Cigarettes  . Smokeless tobacco: Not on file  . Alcohol Use: No   OB History   Grav Para Term Preterm Abortions TAB SAB Ect Mult Living                 Review of Systems  Constitutional: Negative for fever and activity change.       All ROS  Neg except as noted in HPI  HENT: Negative for nosebleeds and neck pain.   Eyes: Negative for photophobia and discharge.  Respiratory: Negative for cough, shortness of breath and wheezing.   Cardiovascular: Negative for chest pain and palpitations.  Gastrointestinal: Positive for diarrhea and change in bowel habit. Negative for abdominal pain and blood in stool.  Genitourinary: Negative for dysuria, frequency and hematuria.  Musculoskeletal: Positive for arthralgias. Negative for back pain.  Skin: Negative.   Neurological: Positive for headaches. Negative for dizziness, seizures and speech difficulty.  Psychiatric/Behavioral: Negative for hallucinations and confusion.    Allergies  Review of patient's allergies indicates no known allergies.  Home Medications   Current Outpatient Rx  Name  Route  Sig  Dispense  Refill  . acetaminophen (TYLENOL) 500 MG tablet   Oral   Take 500 mg by mouth every 6 (six) hours as needed for pain.         Marland Kitchen lisinopril (PRINIVIL,ZESTRIL) 20 MG tablet   Oral   Take 1 tablet (20 mg total) by mouth daily.   30 tablet   0    BP 195/103  Pulse 83  Temp(Src) 97.9 F (36.6 C) (Oral)  Resp 16  Ht 5\' 1"  (1.549 m)  Wt 134 lb (60.782 kg)  BMI 25.33 kg/m2  SpO2 100% Physical Exam  Nursing note and vitals reviewed. Constitutional: She is oriented to person, place, and time.  She appears well-developed and well-nourished.  Non-toxic appearance.  HENT:  Head: Normocephalic.  Right Ear: Tympanic membrane and external ear normal.  Left Ear: Tympanic membrane and external ear normal.  Eyes: EOM and lids are normal. Pupils are equal, round, and reactive to light.  Neck: Normal range of motion. Neck supple. Carotid bruit is not present.  Cardiovascular: Normal rate, regular rhythm, normal heart sounds, intact distal pulses and normal pulses.   Pulmonary/Chest: Breath sounds normal. No respiratory distress.  Abdominal: Soft. Bowel sounds are normal. There is  no tenderness. There is no guarding.  Musculoskeletal: Normal range of motion.  Is full range of motion of the toes of the right foot. The dorsalis pedis and posterior tibial pulses are 2+. There is a well-healed scar at the plantar surface between the first and second MP joint areas. There is no red streaking appreciated. Is no fluctuance. There is mild to moderate tenderness to palpation of the scabbed area as well as around the scabbed area. There is also soreness to palpation of the heel. The Achilles tendon is intact. There no temperature changes noted between the right and left.  There is bony deformity of the left tibial area anteriorly related to Paget's disease.  Lymphadenopathy:       Head (right side): No submandibular adenopathy present.       Head (left side): No submandibular adenopathy present.    She has no cervical adenopathy.  Neurological: She is alert and oriented to person, place, and time. She has normal strength. No cranial nerve deficit or sensory deficit.  Skin: Skin is warm and dry.  Psychiatric: She has a normal mood and affect. Her speech is normal.    ED Course  Procedures (including critical care time) Labs Reviewed - No data to display No results found. No diagnosis found.  MDM  I have reviewed nursing notes, vital signs, and all appropriate lab and imaging results for this patient. Patient sustained a puncture wound to the right foot on June 21. The patient has been seen and evaluated in the emergency department treated with antibiotics incision and drainage and tetanus. The patient has completed the antibiotics but states that at times when walking she still has pain involving the bottom of her foot. She has very little pain at rest. The patient also states that she has 1-2 episodes of diarrhea that she thinks may be related to the antibiotic.  The complete blood count is within normal limits. The basic metabolic panel is also within normal limits. An x-ray of  the right foot shows a heel spur, but no other evidence of fracture dislocation or bone pathology or infection.  I have discussed all the findings with the patient. The plan at this time is for the patient to be seen by podiatry, especially with relation to her heel spur and pain with certain levels of activity. The patient is given a prescription for Mobic 2 times daily with food, and Norco at bedtime if needed for discomfort. Her  Kathie Dike, PA-C 08/08/12 1020  Kathie Dike, PA-C 08/08/12 1021

## 2012-08-08 NOTE — ED Provider Notes (Signed)
Medical screening examination/treatment/procedure(s) were performed by non-physician practitioner and as supervising physician I was immediately available for consultation/collaboration.   Charles B. Bernette Mayers, MD 08/08/12 1219

## 2012-08-08 NOTE — ED Notes (Signed)
Stepped on nail w/R foot 07/13/12.  Seen here, received Bactrim PO rx and tetanus shot along w/I&D of area.  Completed antibx course.  Foot began to hurt this past Monday in same spot.  No apparent s/o infection.  Has also had diarrhea since being on Bactrim which hasn't resolved.  Saw PMD on 07/30/12 at which time he took a stool sample.  Has not been notified of any infectious process.  Generally has diarrhea 1-2 x day.

## 2013-04-18 ENCOUNTER — Other Ambulatory Visit (HOSPITAL_COMMUNITY): Payer: Self-pay | Admitting: Internal Medicine

## 2013-04-18 DIAGNOSIS — Z1231 Encounter for screening mammogram for malignant neoplasm of breast: Secondary | ICD-10-CM

## 2013-04-21 ENCOUNTER — Ambulatory Visit (HOSPITAL_COMMUNITY)
Admission: RE | Admit: 2013-04-21 | Discharge: 2013-04-21 | Disposition: A | Discharge status: Home / Self Care | Payer: Medicaid Other | Source: Ambulatory Visit | Attending: Internal Medicine | Admitting: Internal Medicine

## 2013-04-21 DIAGNOSIS — Z1231 Encounter for screening mammogram for malignant neoplasm of breast: Secondary | ICD-10-CM | POA: Insufficient documentation

## 2013-04-24 ENCOUNTER — Other Ambulatory Visit: Payer: Self-pay | Admitting: Internal Medicine

## 2013-04-24 DIAGNOSIS — R928 Other abnormal and inconclusive findings on diagnostic imaging of breast: Secondary | ICD-10-CM

## 2013-05-05 ENCOUNTER — Other Ambulatory Visit: Payer: Self-pay | Admitting: Women's Health

## 2013-05-07 ENCOUNTER — Other Ambulatory Visit: Payer: Self-pay | Admitting: Internal Medicine

## 2013-05-07 ENCOUNTER — Ambulatory Visit (HOSPITAL_COMMUNITY)
Admission: RE | Admit: 2013-05-07 | Discharge: 2013-05-07 | Disposition: A | Discharge status: Home / Self Care | Payer: Medicaid Other | Source: Ambulatory Visit | Attending: Internal Medicine | Admitting: Internal Medicine

## 2013-05-07 DIAGNOSIS — R928 Other abnormal and inconclusive findings on diagnostic imaging of breast: Secondary | ICD-10-CM

## 2013-05-14 ENCOUNTER — Ambulatory Visit (HOSPITAL_COMMUNITY)
Admission: RE | Admit: 2013-05-14 | Discharge: 2013-05-14 | Disposition: A | Discharge status: Home / Self Care | Payer: Medicaid Other | Source: Ambulatory Visit | Attending: Internal Medicine | Admitting: Internal Medicine

## 2013-05-14 DIAGNOSIS — R928 Other abnormal and inconclusive findings on diagnostic imaging of breast: Secondary | ICD-10-CM | POA: Insufficient documentation

## 2013-08-05 ENCOUNTER — Encounter (HOSPITAL_COMMUNITY): Payer: Self-pay | Admitting: Emergency Medicine

## 2013-08-05 ENCOUNTER — Emergency Department (HOSPITAL_COMMUNITY)
Admission: EM | Admit: 2013-08-05 | Discharge: 2013-08-05 | Disposition: A | Discharge status: Home / Self Care | Payer: Medicaid Other | Attending: Emergency Medicine | Admitting: Emergency Medicine

## 2013-08-05 DIAGNOSIS — Z8719 Personal history of other diseases of the digestive system: Secondary | ICD-10-CM | POA: Diagnosis not present

## 2013-08-05 DIAGNOSIS — I10 Essential (primary) hypertension: Secondary | ICD-10-CM | POA: Diagnosis not present

## 2013-08-05 DIAGNOSIS — F17200 Nicotine dependence, unspecified, uncomplicated: Secondary | ICD-10-CM | POA: Insufficient documentation

## 2013-08-05 DIAGNOSIS — H5710 Ocular pain, unspecified eye: Secondary | ICD-10-CM | POA: Diagnosis present

## 2013-08-05 DIAGNOSIS — H00016 Hordeolum externum left eye, unspecified eyelid: Secondary | ICD-10-CM

## 2013-08-05 DIAGNOSIS — Z79899 Other long term (current) drug therapy: Secondary | ICD-10-CM | POA: Insufficient documentation

## 2013-08-05 DIAGNOSIS — H00019 Hordeolum externum unspecified eye, unspecified eyelid: Secondary | ICD-10-CM | POA: Insufficient documentation

## 2013-08-05 DIAGNOSIS — Z8679 Personal history of other diseases of the circulatory system: Secondary | ICD-10-CM | POA: Diagnosis not present

## 2013-08-05 MED ORDER — TOBRAMYCIN 0.3 % OP SOLN: 2.0000 [drp] | OPHTHALMIC | Status: DC

## 2013-08-05 NOTE — Discharge Instructions (Signed)
Follow up with Dr. Iona Hansen or another eye md in 2-3 days

## 2013-08-05 NOTE — ED Notes (Signed)
Pt c/o left eye irritation, itching that started a few days ago, denies any injury.

## 2013-08-05 NOTE — ED Provider Notes (Signed)
CSN: [id number]     Arrival date & time 08/05/13  5397 History  This chart was scribed for Megan Diego, MD,  by Stacy Gardner, ED Scribe. The patient was seen in room APA03/AP[facility]03 and the patient's care was started at 8:02 AM.    First MD Initiated Contact with Patient 08/05/13 [phone]     Chief Complaint  Patient presents with  . Eye Problem     (Consider location/radiation/quality/duration/timing/severity/associated sxs/prior Treatment) The history is provided by the patient and medical records. No language interpreter was used.   HPI Comments: Megan Sims is a 58 y.o. female who presents to the Emergency Department complaining of left lateral lower eye swelling onset yesterday and is worse this morning. Pt has constant, moderate, pain to the lateral aspect of her left eyelid and associated itching. Denies injury or trauma. She does not have a optometrist. Pt's PCP is Dr. Legrand Rams.   Past Medical History  Diagnosis Date  . Migraine   . Lung abnormality   . Hypertension   . Pleurisy   . Cerebral aneurysm 2 brain surgeries 96 or 97  . Paget disease of bone   . Diarrhea    Past Surgical History  Procedure Laterality Date  . Chest tube insertion    . Tubal ligation    . Cerebral aneurysm repair  96 or 97   No family history on file. History  Substance Use Topics  . Smoking status: Current Every Day Smoker -- 0.50 packs/day    Types: Cigarettes  . Smokeless tobacco: Not on file  . Alcohol Use: No   OB History   Grav Para Term Preterm Abortions TAB SAB Ect Mult Living                 Review of Systems  Eyes: Positive for pain and itching. Negative for discharge.       Lower eyelid swelling  All other systems reviewed and are negative.     Allergies  Review of patient's allergies indicates no known allergies.  Home Medications   Prior to Admission medications   Medication Sig Start Date End Date Taking? Authorizing Provider  acetaminophen (TYLENOL) 500 MG  tablet Take 500 mg by mouth every 6 (six) hours as needed for pain.    Historical Provider, MD  HYDROcodone-acetaminophen (NORCO/VICODIN) 5-325 MG per tablet 1 or 2 po q hs, or q4h prn pain 08/08/12   Lenox Ahr, PA-C  lisinopril (PRINIVIL,ZESTRIL) 20 MG tablet Take 1 tablet (20 mg total) by mouth daily. 03/14/12   Megan Diego, MD  meloxicam (MOBIC) 7.5 MG tablet 1 po bid with food 08/08/12   Lenox Ahr, PA-C   BP 217/122  Pulse 87  Temp(Src) 97.7 F (36.5 C) (Oral)  Resp 16  Wt 128 lb (58.06 kg)  SpO2 95% Physical Exam  Constitutional: She is oriented to person, place, and time. She appears well-developed.  HENT:  Head: Normocephalic.  Eyes: Conjunctivae are normal.  Small stye to left lower lateral lid   Neck: No tracheal deviation present.  Cardiovascular:  No murmur heard. Musculoskeletal: Normal range of motion.  Neurological: She is oriented to person, place, and time.  Skin: Skin is warm.  Psychiatric: She has a normal mood and affect.    ED Course  Procedures (including critical care time) DIAGNOSTIC STUDIES: Oxygen Saturation is 95% on room air, normal by my interpretation.    COORDINATION OF CARE:  8:06 AM Discussed course of care . Pt  understands and agrees.   Labs Review Labs Reviewed - No data to display  Imaging Review No results found.   EKG Interpretation None      MDM   Final diagnoses:  None   The chart was scribed for me under my direct supervision.  I personally performed the history, physical, and medical decision making and all procedures in the evaluation of this patient.Megan Diego, MD 08/05/13 [phone]

## 2014-08-21 ENCOUNTER — Encounter (HOSPITAL_COMMUNITY): Payer: Self-pay | Admitting: *Deleted

## 2014-08-21 ENCOUNTER — Emergency Department (HOSPITAL_COMMUNITY)
Admission: EM | Admit: 2014-08-21 | Discharge: 2014-08-21 | Disposition: A | Discharge status: Home / Self Care | Payer: Medicaid Other | Attending: Emergency Medicine | Admitting: Emergency Medicine

## 2014-08-21 DIAGNOSIS — R21 Rash and other nonspecific skin eruption: Secondary | ICD-10-CM

## 2014-08-21 DIAGNOSIS — Z720 Tobacco use: Secondary | ICD-10-CM | POA: Insufficient documentation

## 2014-08-21 DIAGNOSIS — Z8679 Personal history of other diseases of the circulatory system: Secondary | ICD-10-CM | POA: Insufficient documentation

## 2014-08-21 DIAGNOSIS — I10 Essential (primary) hypertension: Secondary | ICD-10-CM | POA: Insufficient documentation

## 2014-08-21 DIAGNOSIS — Z79899 Other long term (current) drug therapy: Secondary | ICD-10-CM | POA: Diagnosis not present

## 2014-08-21 DIAGNOSIS — Z8669 Personal history of other diseases of the nervous system and sense organs: Secondary | ICD-10-CM | POA: Diagnosis not present

## 2014-08-21 MED ORDER — PREDNISONE 10 MG PO TABS: 60.0000 mg | ORAL_TABLET | Freq: Every day | ORAL | Status: AC

## 2014-08-21 MED ORDER — DIPHENHYDRAMINE HCL 25 MG PO TABS: 25.0000 mg | ORAL_TABLET | Freq: Three times a day (TID) | ORAL | Status: DC | PRN

## 2014-08-21 MED ORDER — TRIAMCINOLONE ACETONIDE 0.1 % EX CREA: 1.0000 "application " | TOPICAL_CREAM | Freq: Four times a day (QID) | CUTANEOUS | Status: DC | PRN

## 2014-08-21 NOTE — ED Provider Notes (Signed)
CSN: [id number]     Arrival date & time 08/21/14  1558 History   First MD Initiated Contact with Patient 08/21/14 1611     Chief Complaint  Patient presents with  . Allergic Reaction     (Consider location/radiation/quality/duration/timing/severity/associated sxs/prior Treatment) Patient is a 59 y.o. female presenting with rash.  Rash Location: neck, face, elbows, ankles. Quality: burning, itchiness and painful   Severity:  Mild Onset quality:  Sudden Duration:  1 week Timing:  Constant Progression:  Unchanged Chronicity:  New Context: not hot tub use and not sick contacts   Context comment:  After using hair dye  Relieved by: improved with steroids. Associated symptoms: no fever and no shortness of breath     Past Medical History  Diagnosis Date  . Migraine   . Lung abnormality   . Hypertension   . Pleurisy   . Cerebral aneurysm 2 brain surgeries 96 or 97  . Paget disease of bone   . Diarrhea    Past Surgical History  Procedure Laterality Date  . Chest tube insertion    . Tubal ligation    . Cerebral aneurysm repair  96 or 97   No family history on file. History  Substance Use Topics  . Smoking status: Current Every Day Smoker -- 0.50 packs/day for 34 years    Types: Cigarettes  . Smokeless tobacco: Not on file  . Alcohol Use: No   OB History    Gravida Para Term Preterm AB TAB SAB Ectopic Multiple Living            3     Review of Systems  Constitutional: Negative for fever and chills.  HENT: Negative for congestion, drooling and ear pain.   Eyes: Negative for photophobia, pain and itching.  Respiratory: Negative for cough and shortness of breath.   Skin: Positive for rash. Negative for pallor.  All other systems reviewed and are negative.     Allergies  Review of patient's allergies indicates no known allergies.  Home Medications   Prior to Admission medications   Medication Sig Start Date End Date Taking? Authorizing Provider  acetaminophen  (TYLENOL) 500 MG tablet Take 500 mg by mouth every 6 (six) hours as needed for pain.    Historical Provider, MD  diphenhydrAMINE (BENADRYL) 25 MG tablet Take 1-2 tablets (25-50 mg total) by mouth every 8 (eight) hours as needed for itching. 08/21/14   Merrily Pew, MD  HYDROcodone-acetaminophen (NORCO/VICODIN) 5-325 MG per tablet 1 or 2 po q hs, or q4h prn pain 08/08/12   Lily Kocher, PA-C  lisinopril (PRINIVIL,ZESTRIL) 20 MG tablet Take 1 tablet (20 mg total) by mouth daily. 03/14/12   [name], MD  meloxicam (MOBIC) 7.5 MG tablet 1 po bid with food 08/08/12   Lily Kocher, PA-C  predniSONE (DELTASONE) 10 MG tablet Take 6 tablets (60 mg total) by mouth daily with breakfast. 08/21/14 08/28/14  Merrily Pew, MD  tobramycin (TOBREX) 0.3 % ophthalmic solution Place 2 drops into the left eye every 4 (four) hours. 08/05/13   [name], MD  triamcinolone cream (KENALOG) 0.1 % Apply 1 application topically 4 (four) times daily as needed. 08/21/14   Merrily Pew, MD   BP 179/96 mmHg  Pulse 104  Temp(Src) 98.4 F (36.9 C) (Oral)  Resp 18  Ht 5' 1.5" (1.562 m)  Wt 140 lb (63.504 kg)  BMI 26.03 kg/m2  SpO2 99% Physical Exam  Constitutional: She is oriented to person, place, and time. She appears  well-developed and well-nourished.  HENT:  Head: Normocephalic and atraumatic.  Eyes: Conjunctivae and EOM are normal. Right eye exhibits no discharge. Left eye exhibits no discharge.  Cardiovascular: Normal rate and regular rhythm.   Pulmonary/Chest: Effort normal and breath sounds normal. No respiratory distress.  Abdominal: Soft. She exhibits no distension. There is no tenderness. There is no rebound.  Musculoskeletal: Normal range of motion. She exhibits no edema or tenderness.  Neurological: She is alert and oriented to person, place, and time.  Skin: Skin is warm and dry. Rash (erythematous, excoriated, dry scaly rash to elbows, face, ankles with some lichenification) noted.  Nursing note and  vitals reviewed.   ED Course  Procedures (including critical care time) Labs Review Labs Reviewed - No data to display  Imaging Review No results found.   EKG Interpretation   Date/Time:  Friday August 21 2014 16:34:55 EDT Ventricular Rate:  102 PR Interval:  157 QRS Duration: 77 QT Interval:  357 QTC Calculation: 465 R Axis:   34 Text Interpretation:  Sinus tachycardia Probable left atrial enlargement  Anteroseptal infarct, old Confirmed by [facility] MD, Corene Cornea [phone]) on  08/21/2014 4:37:26 PM      MDM   Final diagnoses:  Rash    59 year old female with a diffuse rash in multiple areas of her body most extensor surfaces. Unsure of the cause however appears to be similar to an atopic dermatitis. She had relief's steroids before so he started those at 60 mg daily. We'll also give her some steroid cream to put directly on the itchy parts. Also advised it higher doses of Benadryl that which she had been using for symptomatic relief. She will follow-up with her doctor next week to ensure improvement of her rash. Initial heart rate was 120, she says that she drinks a lot of caffeine in the likely be secondary to that. Her heart rate decreased to below 100 prior to discharge. An EKG was done which showed no evidence of atrial fibrillation, flutter or other serious causes for tachycardia so patient will continue to drink by mouth fluids and is stable for discharge home to follow up with her primary doctor.  I have personally and contemperaneously reviewed labs and imaging and used in my decision making as above.   A medical screening exam was performed and I feel the patient has had an appropriate workup for their chief complaint at this time and likelihood of emergent condition existing is low. They have been counseled on decision, discharge, follow up and which symptoms necessitate immediate return to the emergency department. They or their family verbally stated understanding and agreement  with plan and discharged in stable condition.      Merrily Pew, MD 08/21/14 2215

## 2014-08-21 NOTE — ED Notes (Addendum)
Pt c/o rash around bilateral eyes, in nose, chest, bilateral arms that started about a week ago. Pt went to [facility] hospital last week and they gave her Benadryl, Zantac and Prednisone to take at home but she hasn't had much relief. Only thing different that pt can think of is that she had her hair dyed last Saturday morning that burn and stung. Pt's BP 187/96, HR 120, pt is out of blood pressure medication x 1 month. She goes to see doctor again this week to get it refilled.

## 2014-09-05 ENCOUNTER — Emergency Department (HOSPITAL_COMMUNITY): Payer: Medicaid Other

## 2014-09-05 ENCOUNTER — Encounter (HOSPITAL_COMMUNITY): Payer: Self-pay | Admitting: *Deleted

## 2014-09-05 ENCOUNTER — Inpatient Hospital Stay (HOSPITAL_COMMUNITY)
Admission: EM | Admit: 2014-09-05 | Discharge: 2014-09-12 | DRG: 816 | Disposition: A | Discharge status: Home / Self Care | Payer: Medicaid Other | Attending: Internal Medicine | Admitting: Internal Medicine

## 2014-09-05 DIAGNOSIS — R1310 Dysphagia, unspecified: Secondary | ICD-10-CM | POA: Diagnosis not present

## 2014-09-05 DIAGNOSIS — R5383 Other fatigue: Secondary | ICD-10-CM | POA: Diagnosis not present

## 2014-09-05 DIAGNOSIS — R609 Edema, unspecified: Secondary | ICD-10-CM

## 2014-09-05 DIAGNOSIS — C3492 Malignant neoplasm of unspecified part of left bronchus or lung: Secondary | ICD-10-CM

## 2014-09-05 DIAGNOSIS — F17210 Nicotine dependence, cigarettes, uncomplicated: Secondary | ICD-10-CM | POA: Diagnosis present

## 2014-09-05 DIAGNOSIS — I10 Essential (primary) hypertension: Secondary | ICD-10-CM | POA: Diagnosis not present

## 2014-09-05 DIAGNOSIS — D499 Neoplasm of unspecified behavior of unspecified site: Secondary | ICD-10-CM

## 2014-09-05 DIAGNOSIS — C4499 Other specified malignant neoplasm of skin, unspecified: Secondary | ICD-10-CM | POA: Diagnosis present

## 2014-09-05 DIAGNOSIS — Z79899 Other long term (current) drug therapy: Secondary | ICD-10-CM

## 2014-09-05 DIAGNOSIS — C3491 Malignant neoplasm of unspecified part of right bronchus or lung: Secondary | ICD-10-CM | POA: Diagnosis present

## 2014-09-05 DIAGNOSIS — N289 Disorder of kidney and ureter, unspecified: Secondary | ICD-10-CM

## 2014-09-05 DIAGNOSIS — D72829 Elevated white blood cell count, unspecified: Secondary | ICD-10-CM | POA: Diagnosis present

## 2014-09-05 DIAGNOSIS — M889 Osteitis deformans of unspecified bone: Secondary | ICD-10-CM | POA: Diagnosis present

## 2014-09-05 DIAGNOSIS — R590 Localized enlarged lymph nodes: Principal | ICD-10-CM | POA: Diagnosis present

## 2014-09-05 DIAGNOSIS — T7840XD Allergy, unspecified, subsequent encounter: Secondary | ICD-10-CM

## 2014-09-05 DIAGNOSIS — T7840XA Allergy, unspecified, initial encounter: Secondary | ICD-10-CM | POA: Diagnosis present

## 2014-09-05 DIAGNOSIS — C8590 Non-Hodgkin lymphoma, unspecified, unspecified site: Secondary | ICD-10-CM

## 2014-09-05 HISTORY — DX: Malignant (primary) neoplasm, unspecified: C80.1

## 2014-09-05 HISTORY — DX: Disorder of kidney and ureter, unspecified: N28.9

## 2014-09-05 LAB — COMPREHENSIVE METABOLIC PANEL
ALK PHOS: 175 U/L — AB (ref 38–126)
ALT: 27 U/L (ref 14–54)
ANION GAP: 9 (ref 5–15)
AST: 49 U/L — AB (ref 15–41)
Albumin: 3.2 g/dL — ABNORMAL LOW (ref 3.5–5.0)
BUN: 26 mg/dL — AB (ref 6–20)
CO2: 24 mmol/L (ref 22–32)
CREATININE: 1.25 mg/dL — AB (ref 0.44–1.00)
Calcium: 8.8 mg/dL — ABNORMAL LOW (ref 8.9–10.3)
Chloride: 105 mmol/L (ref 101–111)
GFR, EST AFRICAN AMERICAN: 53 mL/min — AB (ref >60)
GFR, EST NON AFRICAN AMERICAN: 46 mL/min — AB (ref >60)
Glucose, Bld: 97 mg/dL (ref 65–99)
POTASSIUM: 4.4 mmol/L (ref 3.5–5.1)
SODIUM: 138 mmol/L (ref 135–145)
Total Bilirubin: 0.6 mg/dL (ref 0.3–1.2)
Total Protein: 6.5 g/dL (ref 6.5–8.1)

## 2014-09-05 LAB — CBC
HCT: 44.7 % (ref 36.0–46.0)
HEMOGLOBIN: 14.6 g/dL (ref 12.0–15.0)
MCH: 30 pg (ref 26.0–34.0)
MCHC: 32.7 g/dL (ref 30.0–36.0)
MCV: 91.8 fL (ref 78.0–100.0)
PLATELETS: 227 10*3/uL (ref 150–400)
RBC: 4.87 MIL/uL (ref 3.87–5.11)
RDW: 16.8 % — AB (ref 11.5–15.5)
WBC: 12.4 10*3/uL — ABNORMAL HIGH (ref 4.0–10.5)

## 2014-09-05 LAB — URINALYSIS, ROUTINE W REFLEX MICROSCOPIC
BILIRUBIN URINE: NEGATIVE
Glucose, UA: NEGATIVE mg/dL
Hgb urine dipstick: NEGATIVE
Ketones, ur: NEGATIVE mg/dL
Nitrite: NEGATIVE
PH: 6.5 (ref 5.0–8.0)
Specific Gravity, Urine: 1.025 (ref 1.005–1.030)
Urobilinogen, UA: 0.2 mg/dL (ref 0.0–1.0)

## 2014-09-05 LAB — CBC WITH DIFFERENTIAL/PLATELET
Basophils Absolute: 0 10*3/uL (ref 0.0–0.1)
Basophils Relative: 0 % (ref 0–1)
Eosinophils Absolute: 0.1 10*3/uL (ref 0.0–0.7)
Eosinophils Relative: 1 % (ref 0–5)
HCT: 43.8 % (ref 36.0–46.0)
HEMOGLOBIN: 14.7 g/dL (ref 12.0–15.0)
LYMPHS ABS: 2 10*3/uL (ref 0.7–4.0)
Lymphocytes Relative: 16 % (ref 12–46)
MCH: 30.8 pg (ref 26.0–34.0)
MCHC: 33.6 g/dL (ref 30.0–36.0)
MCV: 91.6 fL (ref 78.0–100.0)
MONOS PCT: 6 % (ref 3–12)
Monocytes Absolute: 0.7 10*3/uL (ref 0.1–1.0)
Neutro Abs: 9.5 10*3/uL — ABNORMAL HIGH (ref 1.7–7.7)
Neutrophils Relative %: 77 % (ref 43–77)
PLATELETS: 200 10*3/uL (ref 150–400)
RBC: 4.78 MIL/uL (ref 3.87–5.11)
RDW: 16.7 % — AB (ref 11.5–15.5)
WBC: 12.3 10*3/uL — ABNORMAL HIGH (ref 4.0–10.5)

## 2014-09-05 LAB — URINE MICROSCOPIC-ADD ON

## 2014-09-05 LAB — CREATININE, SERUM
CREATININE: 1.19 mg/dL — AB (ref 0.44–1.00)
GFR calc non Af Amer: 49 mL/min — ABNORMAL LOW (ref >60)
GFR, EST AFRICAN AMERICAN: 57 mL/min — AB (ref >60)

## 2014-09-05 LAB — SEDIMENTATION RATE: SED RATE: 17 mm/h (ref 0–22)

## 2014-09-05 MED ORDER — ONDANSETRON HCL 4 MG PO TABS
4.0000 mg | ORAL_TABLET | Freq: Four times a day (QID) | ORAL | Status: DC | PRN
Start: 2014-09-05 — End: 2014-09-12

## 2014-09-05 MED ORDER — AMLODIPINE BESYLATE 5 MG PO TABS
5.0000 mg | ORAL_TABLET | Freq: Every day | ORAL | Status: DC
Start: 2014-09-06 — End: 2014-09-07
  Administered 2014-09-06 – 2014-09-07 (×2): 5 mg via ORAL
  Filled 2014-09-05 (×2): qty 1

## 2014-09-05 MED ORDER — ACETAMINOPHEN 650 MG RE SUPP: 650.0000 mg | Freq: Four times a day (QID) | RECTAL | Status: DC | PRN

## 2014-09-05 MED ORDER — HEPARIN SODIUM (PORCINE) 5000 UNIT/ML IJ SOLN
5000.0000 [IU] | Freq: Three times a day (TID) | INTRAMUSCULAR | Status: AC
  Administered 2014-09-05 – 2014-09-10 (×15): 5000 [IU] via SUBCUTANEOUS
  Filled 2014-09-05 (×17): qty 1

## 2014-09-05 MED ORDER — SODIUM CHLORIDE 0.45 % IV SOLN
INTRAVENOUS | Status: DC
  Administered 2014-09-05 – 2014-09-07 (×4): via INTRAVENOUS
  Administered 2014-09-07: 100 mL/h via INTRAVENOUS
  Administered 2014-09-08 – 2014-09-11 (×5): via INTRAVENOUS

## 2014-09-05 MED ORDER — ONDANSETRON HCL 4 MG/2ML IJ SOLN: 4.0000 mg | Freq: Four times a day (QID) | INTRAMUSCULAR | Status: DC | PRN

## 2014-09-05 MED ORDER — HYDRALAZINE HCL 20 MG/ML IJ SOLN
5.0000 mg | INTRAMUSCULAR | Status: DC | PRN
  Administered 2014-09-07: 5 mg via INTRAVENOUS
  Administered 2014-09-07 – 2014-09-10 (×6): 10 mg via INTRAVENOUS
  Filled 2014-09-05 (×7): qty 1

## 2014-09-05 MED ORDER — NICOTINE 14 MG/24HR TD PT24
14.0000 mg | MEDICATED_PATCH | Freq: Every day | TRANSDERMAL | Status: DC
  Filled 2014-09-05 (×3): qty 1

## 2014-09-05 MED ORDER — DIPHENHYDRAMINE HCL 25 MG PO CAPS
50.0000 mg | ORAL_CAPSULE | Freq: Four times a day (QID) | ORAL | Status: DC | PRN
  Administered 2014-09-08 – 2014-09-11 (×4): 50 mg via ORAL
  Filled 2014-09-05 (×4): qty 2

## 2014-09-05 MED ORDER — METHYLPREDNISOLONE SODIUM SUCC 125 MG IJ SOLR
60.0000 mg | Freq: Four times a day (QID) | INTRAMUSCULAR | Status: DC
  Administered 2014-09-06 (×2): 60 mg via INTRAVENOUS
  Filled 2014-09-05 (×2): qty 2

## 2014-09-05 MED ORDER — ACETAMINOPHEN 325 MG PO TABS: 650.0000 mg | ORAL_TABLET | Freq: Four times a day (QID) | ORAL | Status: DC | PRN

## 2014-09-05 MED ORDER — OXYCODONE HCL 5 MG PO TABS
5.0000 mg | ORAL_TABLET | ORAL | Status: DC | PRN
  Administered 2014-09-05 – 2014-09-12 (×18): 5 mg via ORAL
  Filled 2014-09-05 (×18): qty 1

## 2014-09-05 MED ORDER — METHYLPREDNISOLONE SODIUM SUCC 125 MG IJ SOLR
125.0000 mg | Freq: Once | INTRAMUSCULAR | Status: AC
  Administered 2014-09-05: 125 mg via INTRAVENOUS
  Filled 2014-09-05: qty 2

## 2014-09-05 NOTE — ED Notes (Signed)
After patient passed swallow screen, patient was given Oxycodone and stated some difficulty swallowing pill.

## 2014-09-05 NOTE — ED Provider Notes (Signed)
CSN: [id number]     Arrival date & time 09/05/14  1716 History   First MD Initiated Contact with Patient 09/05/14 1736     Chief Complaint  Patient presents with  . Allergic Reaction     (Consider location/radiation/quality/duration/timing/severity/associated sxs/prior Treatment) Patient is a 59 y.o. female presenting with rash.  Rash Location:  Full body Quality: itchiness and redness   Severity:  Mild Onset quality:  Gradual Duration:  4 weeks Timing:  Constant Progression:  Waxing and waning Chronicity:  New Relieved by:  None tried Worsened by:  Nothing tried Ineffective treatments:  None tried Associated symptoms: no abdominal pain, no diarrhea, no fatigue, no fever, no shortness of breath and not wheezing     Past Medical History  Diagnosis Date  . Migraine   . Lung abnormality   . Hypertension   . Pleurisy   . Cerebral aneurysm 2 brain surgeries 96 or 97  . Paget disease of bone   . Diarrhea    Past Surgical History  Procedure Laterality Date  . Chest tube insertion    . Tubal ligation    . Cerebral aneurysm repair  96 or 40   Family History  Problem Relation Age of Onset  . Hypertension    . Diabetes    . Kidney disease     Social History  Substance Use Topics  . Smoking status: Current Every Day Smoker -- 0.50 packs/day for 34 years    Types: Cigarettes  . Smokeless tobacco: None  . Alcohol Use: No   OB History    Gravida Para Term Preterm AB TAB SAB Ectopic Multiple Living            3     Review of Systems  Constitutional: Negative for fever and fatigue.  HENT: Positive for facial swelling. Negative for drooling, ear pain and postnasal drip.   Eyes: Negative for pain and redness.  Respiratory: Negative for shortness of breath and wheezing.   Gastrointestinal: Negative for abdominal pain and diarrhea.  Endocrine: Negative for polydipsia and polyuria.  Genitourinary: Negative for dysuria and enuresis.  Skin: Positive for rash.       Allergies  Review of patient's allergies indicates no known allergies.  Home Medications   Prior to Admission medications   Medication Sig Start Date End Date Taking? Authorizing Provider  amLODipine (NORVASC) 5 MG tablet Take 5 mg by mouth daily.   Yes Historical Provider, MD  diphenhydrAMINE (BENADRYL) 25 MG tablet Take 1-2 tablets (25-50 mg total) by mouth every 8 (eight) hours as needed for itching. Patient taking differently: Take 50 mg by mouth every 8 (eight) hours as needed for itching.  08/21/14  Yes Merrily Pew, MD  diphenhydramine-acetaminophen (TYLENOL PM) 25-500 MG TABS Take 2 tablets by mouth every 8 (eight) hours as needed.   Yes Historical Provider, MD  tobramycin-dexamethasone [facility]) ophthalmic solution Place 1 drop into both eyes 2 (two) times daily.   Yes Historical Provider, MD  triamcinolone cream (KENALOG) 0.1 % Apply 1 application topically 4 (four) times daily as needed. 08/21/14   Merrily Pew, MD   BP 186/114 mmHg  Pulse 114  Temp(Src) 97.6 F (36.4 C) (Oral)  Resp 20  Ht '5\' 1"'$  (1.549 m)  Wt 131 lb (59.421 kg)  BMI 24.76 kg/m2  SpO2 100% Physical Exam  Constitutional: She appears well-developed and well-nourished.  HENT:  Head: Normocephalic and atraumatic.  Facial swelling  Cardiovascular: Tachycardia present.   Abdominal: She exhibits no distension. There  is no tenderness. There is no rebound.  Musculoskeletal: She exhibits edema. She exhibits no tenderness.  Skin: Rash noted.  Nursing note and vitals reviewed.   ED Course  Procedures (including critical care time) Labs Review Labs Reviewed  CBC WITH DIFFERENTIAL/PLATELET - Abnormal; Notable for the following:    WBC 12.3 (*)    RDW 16.7 (*)    Neutro Abs 9.5 (*)    All other components within normal limits  COMPREHENSIVE METABOLIC PANEL - Abnormal; Notable for the following:    BUN 26 (*)    Creatinine, Ser 1.25 (*)    Calcium 8.8 (*)    Albumin 3.2 (*)    AST 49 (*)     Alkaline Phosphatase 175 (*)    GFR calc non Af Amer 46 (*)    GFR calc Af Amer 53 (*)    All other components within normal limits  URINALYSIS, ROUTINE W REFLEX MICROSCOPIC (NOT AT [facility]) - Abnormal; Notable for the following:    APPearance HAZY (*)    Protein, ur TRACE (*)    Leukocytes, UA MODERATE (*)    All other components within normal limits  URINE MICROSCOPIC-ADD ON - Abnormal; Notable for the following:    Squamous Epithelial / LPF FEW (*)    Bacteria, UA MANY (*)    All other components within normal limits  ANTI-SMITH ANTIBODY  SJOGRENS SYNDROME-A EXTRACTABLE NUCLEAR ANTIBODY  SJOGRENS SYNDROME-B EXTRACTABLE NUCLEAR ANTIBODY  RHEUMATOID FACTOR  KETONES, URINE    Imaging Review No results found. I, Barbra Miner, Corene Cornea, personally reviewed and evaluated these images and lab results as part of my medical decision-making.   EKG Interpretation None      MDM   Final diagnoses:  Dysphagia  Dysphagia    59-year-old female with a history of Paget's disease presents to the emergency department today secondary to prolonged allergic reaction to what she thinks was hair dye. On exam she has a swelling around her eyes is worse than normal, rash that is similar to the last [employer]. Right concern is for morbid autoimmune process rather than a prolonged allergic reaction so discussed the case with medicine who saw the patient and felt that an admission was necessary as she been having some dysphagia recently. They will do some follow-up studies for that.    Merrily Pew, MD 09/08/14 [phone]

## 2014-09-05 NOTE — ED Notes (Signed)
Pt seen for allergic reaction from using hair dye about a week ago, pt states that she is getting worse and meds are not working, pt with swelling around eyes, per pt last three days with tongue swelling, sore throat; states mild SOB since yesterday, no distress noted at this time

## 2014-09-05 NOTE — H&P (Addendum)
[facility] History and Physical   Megan Sims BPZ:[id number] DOB: 03-May-1955 DOA: 09/05/2014  Referring physician: Dr. Lynnea Ferrier - [facility] PCP: Rosita Fire, MD   Chief Complaint: Allergic reaction  HPI: Megan Sims is a 59 y.o. female  3-4 weeks ago pt developed burning scalp and sores shortly after getting her hair done. Went to [employer] and then came here as she was not any better. States she felt better for the first 2 days after coming to ED on 08/21/14. Since that time has developed increasing fatigue, weakness, difficulty swallowing food, and neck and shoulder pain. Skin whelps never fully resolved but improved on steroids. Significant worsening over past 2 days. Last dose of steroids (42m) 2 days ago. .[place] Scrapelike she has "pepper on my tongue."   No fevers.   This "may" have occurred to a lesser degree at prior hair appts.     Review of Systems:  Constitutional:  No weight loss, night sweats,  HEENT:  No headaches, Difficulty swallowing,Tooth/dental problems,Sore throat,  No sneezing, itching, ear ache, nasal congestion, post nasal drip,  Cardio-vascular:  No chest pain, Orthopnea, PND, swelling in lower extremities, anasarca, dizziness, palpitations  GI:  No heartburn, indigestion, abdominal pain, nausea, vomiting, diarrhea, change in bowel habits, loss of appetite  Resp:   No shortness of breath with exertion or at rest. No excess mucus, no productive cough, No non-productive cough, No coughing up of blood.No change in color of mucus.No wheezing.No chest wall deformity  Skin: per HPI GU:  no dysuria, change in color of urine, no urgency or frequency. No flank pain.  Musculoskeletal:  Per HPI Psych:  No change in mood or affect. No depression or anxiety. No memory loss.   Past Medical History  Diagnosis Date  . Migraine   . Lung abnormality   . Hypertension   . Pleurisy   . Cerebral aneurysm 2 brain surgeries 96 or 97  . Paget disease of bone   . Diarrhea     Past Surgical History  Procedure Laterality Date  . Chest tube insertion    . Tubal ligation    . Cerebral aneurysm repair  96 or 97   Social History:  reports that she has been smoking Cigarettes.  She has a 17 pack-year smoking history. She does not have any smokeless tobacco history on file. She reports that she does not drink alcohol or use illicit drugs.  No Known Allergies  Family History  Problem Relation Age of Onset  . Hypertension    . Diabetes    . Kidney disease       Prior to Admission medications   Medication Sig Start Date End Date Taking? Authorizing Provider  amLODipine (NORVASC) 5 MG tablet Take 5 mg by mouth daily.   Yes Historical Provider, MD  diphenhydrAMINE (BENADRYL) 25 MG tablet Take 1-2 tablets (25-50 mg total) by mouth every 8 (eight) hours as needed for itching. Patient taking differently: Take 50 mg by mouth every 8 (eight) hours as needed for itching.  08/21/14  Yes JMerrily Pew MD  diphenhydramine-acetaminophen (TYLENOL PM) 25-500 MG TABS Take 2 tablets by mouth every 8 (eight) hours as needed.   Yes Historical Provider, MD  tobramycin-dexamethasone ([facility] ophthalmic solution Place 1 drop into both eyes 2 (two) times daily.   Yes Historical Provider, MD  triamcinolone cream (KENALOG) 0.1 % Apply 1 application topically 4 (four) times daily as needed. 08/21/14   JMerrily Pew MD   Physical Exam: FDanley DankerVitals:  09/05/14 1726  BP: 186/114  Pulse: 114  Temp: 97.6 F (36.4 C)  TempSrc: Oral  Resp: 20  Height: 5' 1" (1.549 m)  Weight: 59.421 kg (131 lb)  SpO2: 100%    Wt Readings from Last 3 Encounters:  09/05/14 59.421 kg (131 lb)  08/21/14 63.504 kg (140 lb)  08/05/13 58.06 kg (128 lb)    General:  Appears calm and comfortable Eyes:  PERRL, normal lids, irises & conjunctiva ENT:  grossly normal hearing, lips Neck:  no LAD, masses or thyromegaly Cardiovascular:  RRR, no m/r/g. No LE edema. Respiratory:  CTA bilaterally, no w/r/r.  Normal respiratory effort. Abdomen:  soft, ntnd Skin: Macular lesions throughout head/face/scalp, UEs, and to a lesser degree her legs and chest. Periorbital swelling noted  Musculoskeletal: bony abnormalities noted of legs and arms bilat. At baseline grossly normal tone BUE/BLE Psychiatric:  grossly normal mood and affect, speech fluent and appropriate Neurologic:  grossly non-focal.          Labs on Admission:  Basic Metabolic Panel:  Recent Labs Lab 09/05/14 1805  NA 138  K 4.4  CL 105  CO2 24  GLUCOSE 97  BUN 26*  CREATININE 1.25*  CALCIUM 8.8*   Liver Function Tests:  Recent Labs Lab 09/05/14 1805  AST 49*  ALT 27  ALKPHOS 175*  BILITOT 0.6  PROT 6.5  ALBUMIN 3.2*   No results for input(s): LIPASE, AMYLASE in the last 168 hours. No results for input(s): AMMONIA in the last 168 hours. CBC:  Recent Labs Lab 09/05/14 1805  WBC 12.3*  NEUTROABS 9.5*  HGB 14.7  HCT 43.8  MCV 91.6  PLT 200   Cardiac Enzymes: No results for input(s): CKTOTAL, CKMB, CKMBINDEX, TROPONINI in the last 168 hours.  BNP (last 3 results) No results for input(s): BNP in the last 8760 hours.  ProBNP (last 3 results) No results for input(s): PROBNP in the last 8760 hours.  CBG: No results for input(s): GLUCAP in the last 168 hours.  Radiological Exams on Admission: Dg Neck Soft Tissue  09/05/2014   CLINICAL DATA:  Dysphagia.  Difficulty swallowing food.  EXAM: NECK SOFT TISSUES - 1+ VIEW  COMPARISON:  None.  FINDINGS: Mild prevertebral soft tissue swelling is present. Vascular calcifications are noted at the carotid bifurcations bilaterally. The airway is patent. No radiopaque foreign body is present. The lung apices are clear. Mediastinal adenopathy is again noted.  IMPRESSION: 1. Mild prevertebral soft tissue swelling raises concern for pharyngitis or edema. 2. Atherosclerotic calcifications at the carotid bifurcations. 3. Soft tissue in the superior mediastinum compatible with  previously seen adenopathy.   Electronically Signed   By: San Morelle M.D.   On: 09/05/2014 18:56   Dg Chest 2 View  09/05/2014   CLINICAL DATA:  Dysphagia for 4 days, difficulty swallowing fluid  EXAM: CHEST - 2 VIEW  COMPARISON:  08/14/2014  FINDINGS: Cardiac shadow is stable. The lungs are clear bilaterally. No acute bony abnormality is noted. Mild aortic calcifications are seen.  IMPRESSION: Aortic atherosclerotic disease.  No acute abnormality noted.   Electronically Signed   By: Inez Catalina M.D.   On: 09/05/2014 18:52     Assessment/Plan Active Problems:   Dysphagia   Fatigue   Paget's bone disease   Essential hypertension   Allergic reaction   Renal insufficiency   Leukocytosis   Dysphagia: Etiology not immediately clear. Concern for underlying neurologic autoimmune condition vs edema from allergic condition.  - Med surge - +/-  Barium swallow study in am if not improving - +/- GI consult - advance diet as tolerated. - f/u neck plain films - ordered by ED  Allergic Reaction: Etiology not a medially clear. Refer to history of present illness regarding course. Warrants a more thorough investigation as this may be related to an underlying autoimmune condition versus ongoing allergic response to hair dye. Patient has been on steroids for 2 weeks prior to admission. - CRP, ESR, DS DNA, antimitochondrial Ab, Tryptase (will need f/u baseline tryptase level) - Skin biopsy to path - CBC w/ DIff - Benadryl - Solu-Medrol 125 mg 1 followed by 60 mg every 6hrs  HTN: - continue norvasc - Hydralazine PRN  Renal insufficiency: Cr 1.25. Baseline 1.  - IVF - BMET in am  Leukocytosis: WBC 12.3 likely stress related.  - CBC in am   Tobacco: 1/2ppd - nicotine patch   Code Status: FULL DVT Prophylaxis: Hep Family Communication: Husband and daughter Disposition Plan: pending improvement  ,  J, MD Family Medicine [facility] [email or web address] Password  [email or web address]

## 2014-09-06 ENCOUNTER — Observation Stay (HOSPITAL_COMMUNITY): Payer: Medicaid Other

## 2014-09-06 DIAGNOSIS — Z5111 Encounter for antineoplastic chemotherapy: Secondary | ICD-10-CM | POA: Diagnosis not present

## 2014-09-06 DIAGNOSIS — R590 Localized enlarged lymph nodes: Secondary | ICD-10-CM | POA: Diagnosis not present

## 2014-09-06 DIAGNOSIS — N289 Disorder of kidney and ureter, unspecified: Secondary | ICD-10-CM | POA: Diagnosis present

## 2014-09-06 DIAGNOSIS — R1310 Dysphagia, unspecified: Secondary | ICD-10-CM | POA: Diagnosis not present

## 2014-09-06 DIAGNOSIS — C801 Malignant (primary) neoplasm, unspecified: Secondary | ICD-10-CM | POA: Diagnosis not present

## 2014-09-06 DIAGNOSIS — I10 Essential (primary) hypertension: Secondary | ICD-10-CM | POA: Diagnosis present

## 2014-09-06 DIAGNOSIS — D72829 Elevated white blood cell count, unspecified: Secondary | ICD-10-CM | POA: Diagnosis present

## 2014-09-06 DIAGNOSIS — F17210 Nicotine dependence, cigarettes, uncomplicated: Secondary | ICD-10-CM | POA: Diagnosis present

## 2014-09-06 DIAGNOSIS — C4499 Other specified malignant neoplasm of skin, unspecified: Secondary | ICD-10-CM | POA: Diagnosis not present

## 2014-09-06 DIAGNOSIS — M889 Osteitis deformans of unspecified bone: Secondary | ICD-10-CM | POA: Diagnosis not present

## 2014-09-06 DIAGNOSIS — Z79899 Other long term (current) drug therapy: Secondary | ICD-10-CM | POA: Diagnosis not present

## 2014-09-06 DIAGNOSIS — T7840XD Allergy, unspecified, subsequent encounter: Secondary | ICD-10-CM | POA: Diagnosis not present

## 2014-09-06 LAB — COMPREHENSIVE METABOLIC PANEL
ALBUMIN: 3 g/dL — AB (ref 3.5–5.0)
ALT: 27 U/L (ref 14–54)
AST: 43 U/L — ABNORMAL HIGH (ref 15–41)
Alkaline Phosphatase: 173 U/L — ABNORMAL HIGH (ref 38–126)
Anion gap: 9 (ref 5–15)
BUN: 26 mg/dL — AB (ref 6–20)
CALCIUM: 8.6 mg/dL — AB (ref 8.9–10.3)
CHLORIDE: 102 mmol/L (ref 101–111)
CO2: 22 mmol/L (ref 22–32)
Creatinine, Ser: 0.99 mg/dL (ref 0.44–1.00)
GFR calc non Af Amer: >60 mL/min (ref >60)
GLUCOSE: 153 mg/dL — AB (ref 65–99)
Potassium: 4.6 mmol/L (ref 3.5–5.1)
SODIUM: 133 mmol/L — AB (ref 135–145)
TOTAL PROTEIN: 6.3 g/dL — AB (ref 6.5–8.1)
Total Bilirubin: 0.4 mg/dL (ref 0.3–1.2)

## 2014-09-06 LAB — CBC
HEMATOCRIT: 41.9 % (ref 36.0–46.0)
Hemoglobin: 14 g/dL (ref 12.0–15.0)
MCH: 30.7 pg (ref 26.0–34.0)
MCHC: 33.4 g/dL (ref 30.0–36.0)
MCV: 91.9 fL (ref 78.0–100.0)
Platelets: 203 10*3/uL (ref 150–400)
RBC: 4.56 MIL/uL (ref 3.87–5.11)
RDW: 16.7 % — AB (ref 11.5–15.5)
WBC: 8.3 10*3/uL (ref 4.0–10.5)

## 2014-09-06 LAB — C-REACTIVE PROTEIN: CRP: 0.9 mg/dL (ref <1.0)

## 2014-09-06 LAB — KETONES, URINE

## 2014-09-06 MED ORDER — IOHEXOL 300 MG/ML  SOLN
75.0000 mL | Freq: Once | INTRAMUSCULAR | Status: AC | PRN
  Administered 2014-09-06: 75 mL via INTRAVENOUS

## 2014-09-06 MED ORDER — METHYLPREDNISOLONE SODIUM SUCC 125 MG IJ SOLR
125.0000 mg | Freq: Four times a day (QID) | INTRAMUSCULAR | Status: DC
  Administered 2014-09-06 – 2014-09-11 (×21): 125 mg via INTRAVENOUS
  Filled 2014-09-06 (×27): qty 2

## 2014-09-06 MED ORDER — FAMOTIDINE IN NACL 20-0.9 MG/50ML-% IV SOLN
20.0000 mg | Freq: Two times a day (BID) | INTRAVENOUS | Status: DC
  Administered 2014-09-06 – 2014-09-10 (×9): 20 mg via INTRAVENOUS
  Filled 2014-09-06 (×13): qty 50

## 2014-09-06 NOTE — Progress Notes (Signed)
Subjective: This is a patient of Dr. Legrand Rams who was admitted with generalized allergic reaction. Her problem apparently started about 3 weeks ago when she had her hair done and developed some lesions in her scalp. Since then she's developed a rash and swelling around her eyes swelling of her lips and some trouble swallowing. She went to the emergency department in [place] was diagnosed with a allergic reaction and given prednisone. She doesn't feel clear that it has helped  Objective: Vital signs in last 24 hours: Temp:  [97.6 F (36.4 C)-98.2 F (36.8 C)] 98.1 F (36.7 C) (08/14 0600) Pulse Rate:  [92-114] 92 (08/14 0600) Resp:  [20] 20 (08/14 0600) BP: (163-203)/(91-114) 203/95 mmHg (08/14 0600) SpO2:  [98 %-100 %] 100 % (08/14 0600) Weight:  [58.151 kg (128 lb 3.2 oz)-59.421 kg (131 lb)] 58.151 kg (128 lb 3.2 oz) (08/13 2239) Weight change:  Last BM Date: 09/05/14  Intake/Output from previous day: 08/13 0701 - 08/14 0700 In: -  Out: 350 [Urine:350]  PHYSICAL EXAM General appearance: alert, cooperative, mild distress and She has swelling below her eyes some swelling around her lips and has a diffuse maculopapular but not urticarial rash. Resp: clear to auscultation bilaterally Cardio: regular rate and rhythm, S1, S2 normal, no murmur, click, rub or gallop GI: soft, non-tender; bowel sounds normal; no masses,  no organomegaly Extremities: extremities normal, atraumatic, no cyanosis or edema  Lab Results:  Results for orders placed or performed during the hospital encounter of 09/05/14 (from the past 48 hour(s))  Urinalysis, Routine w reflex microscopic (not at [facility])     Status: Abnormal   Collection Time: 09/05/14  6:00 PM  Result Value Ref Range   Color, Urine YELLOW YELLOW   APPearance HAZY (A) CLEAR   Specific Gravity, Urine 1.025 1.005 - 1.030   pH 6.5 5.0 - 8.0   Glucose, UA NEGATIVE NEGATIVE mg/dL   Hgb urine dipstick NEGATIVE NEGATIVE   Bilirubin Urine NEGATIVE NEGATIVE    Ketones, ur NEGATIVE NEGATIVE mg/dL   Protein, ur TRACE (A) NEGATIVE mg/dL   Urobilinogen, UA 0.2 0.0 - 1.0 mg/dL   Nitrite NEGATIVE NEGATIVE   Leukocytes, UA MODERATE (A) NEGATIVE  Urine microscopic-add on     Status: Abnormal   Collection Time: 09/05/14  6:00 PM  Result Value Ref Range   Squamous Epithelial / LPF FEW (A) RARE   WBC, UA 21-50 <3 WBC/hpf   RBC / HPF 3-6 <3 RBC/hpf   Bacteria, UA MANY (A) RARE   Urine-Other MUCOUS PRESENT   CBC with Differential     Status: Abnormal   Collection Time: 09/05/14  6:05 PM  Result Value Ref Range   WBC 12.3 (H) 4.0 - 10.5 K/uL   RBC 4.78 3.87 - 5.11 MIL/uL   Hemoglobin 14.7 12.0 - 15.0 g/dL   HCT 43.8 36.0 - 46.0 %   MCV 91.6 78.0 - 100.0 fL   MCH 30.8 26.0 - 34.0 pg   MCHC 33.6 30.0 - 36.0 g/dL   RDW 16.7 (H) 11.5 - 15.5 %   Platelets 200 150 - 400 K/uL   Neutrophils Relative % 77 43 - 77 %   Neutro Abs 9.5 (H) 1.7 - 7.7 K/uL   Lymphocytes Relative 16 12 - 46 %   Lymphs Abs 2.0 0.7 - 4.0 K/uL   Monocytes Relative 6 3 - 12 %   Monocytes Absolute 0.7 0.1 - 1.0 K/uL   Eosinophils Relative 1 0 - 5 %   Eosinophils  Absolute 0.1 0.0 - 0.7 K/uL   Basophils Relative 0 0 - 1 %   Basophils Absolute 0.0 0.0 - 0.1 K/uL  Comprehensive metabolic panel     Status: Abnormal   Collection Time: 09/05/14  6:05 PM  Result Value Ref Range   Sodium 138 135 - 145 mmol/L   Potassium 4.4 3.5 - 5.1 mmol/L   Chloride 105 101 - 111 mmol/L   CO2 24 22 - 32 mmol/L   Glucose, Bld 97 65 - 99 mg/dL   BUN 26 (H) 6 - 20 mg/dL   Creatinine, Ser 1.25 (H) 0.44 - 1.00 mg/dL   Calcium 8.8 (L) 8.9 - 10.3 mg/dL   Total Protein 6.5 6.5 - 8.1 g/dL   Albumin 3.2 (L) 3.5 - 5.0 g/dL   AST 49 (H) 15 - 41 U/L   ALT 27 14 - 54 U/L   Alkaline Phosphatase 175 (H) 38 - 126 U/L   Total Bilirubin 0.6 0.3 - 1.2 mg/dL   GFR calc non Af Amer 46 (L) >60 mL/min   GFR calc Af Amer 53 (L) >60 mL/min    Comment: (NOTE) The eGFR has been calculated using the CKD EPI  equation. This calculation has not been validated in all clinical situations. eGFR's persistently <60 mL/min signify possible Chronic Kidney Disease.    Anion gap 9 5 - 15  Sedimentation rate     Status: None   Collection Time: 09/05/14  7:33 PM  Result Value Ref Range   Sed Rate 17 0 - 22 mm/hr  CBC     Status: Abnormal   Collection Time: 09/05/14  7:33 PM  Result Value Ref Range   WBC 12.4 (H) 4.0 - 10.5 K/uL   RBC 4.87 3.87 - 5.11 MIL/uL   Hemoglobin 14.6 12.0 - 15.0 g/dL   HCT 44.7 36.0 - 46.0 %   MCV 91.8 78.0 - 100.0 fL   MCH 30.0 26.0 - 34.0 pg   MCHC 32.7 30.0 - 36.0 g/dL   RDW 16.8 (H) 11.5 - 15.5 %   Platelets 227 150 - 400 K/uL  Creatinine, serum     Status: Abnormal   Collection Time: 09/05/14  7:33 PM  Result Value Ref Range   Creatinine, Ser 1.19 (H) 0.44 - 1.00 mg/dL   GFR calc non Af Amer 49 (L) >60 mL/min   GFR calc Af Amer 57 (L) >60 mL/min    Comment: (NOTE) The eGFR has been calculated using the CKD EPI equation. This calculation has not been validated in all clinical situations. eGFR's persistently <60 mL/min signify possible Chronic Kidney Disease.   Ketones, urine     Status: None   Collection Time: 09/06/14 12:11 AM  Result Value Ref Range   Ketones, ur TRACE NEGATIVE mg/dL  CBC     Status: Abnormal   Collection Time: 09/06/14  6:12 AM  Result Value Ref Range   WBC 8.3 4.0 - 10.5 K/uL   RBC 4.56 3.87 - 5.11 MIL/uL   Hemoglobin 14.0 12.0 - 15.0 g/dL   HCT 41.9 36.0 - 46.0 %   MCV 91.9 78.0 - 100.0 fL   MCH 30.7 26.0 - 34.0 pg   MCHC 33.4 30.0 - 36.0 g/dL   RDW 16.7 (H) 11.5 - 15.5 %   Platelets 203 150 - 400 K/uL  Comprehensive metabolic panel     Status: Abnormal   Collection Time: 09/06/14  6:12 AM  Result Value Ref Range   Sodium 133 (L) 135 -  145 mmol/L   Potassium 4.6 3.5 - 5.1 mmol/L   Chloride 102 101 - 111 mmol/L   CO2 22 22 - 32 mmol/L   Glucose, Bld 153 (H) 65 - 99 mg/dL   BUN 26 (H) 6 - 20 mg/dL   Creatinine, Ser 0.99 0.44 -  1.00 mg/dL   Calcium 8.6 (L) 8.9 - 10.3 mg/dL   Total Protein 6.3 (L) 6.5 - 8.1 g/dL   Albumin 3.0 (L) 3.5 - 5.0 g/dL   AST 43 (H) 15 - 41 U/L   ALT 27 14 - 54 U/L   Alkaline Phosphatase 173 (H) 38 - 126 U/L   Total Bilirubin 0.4 0.3 - 1.2 mg/dL   GFR calc non Af Amer >60 >60 mL/min   GFR calc Af Amer >60 >60 mL/min    Comment: (NOTE) The eGFR has been calculated using the CKD EPI equation. This calculation has not been validated in all clinical situations. eGFR's persistently <60 mL/min signify possible Chronic Kidney Disease.    Anion gap 9 5 - 15    ABGS No results for input(s): PHART, PO2ART, TCO2, HCO3 in the last 72 hours.  Invalid input(s): PCO2 CULTURES No results found for this or any previous visit (from the past 240 hour(s)). Studies/Results: Dg Neck Soft Tissue  09/05/2014   CLINICAL DATA:  Dysphagia.  Difficulty swallowing food.  EXAM: NECK SOFT TISSUES - 1+ VIEW  COMPARISON:  None.  FINDINGS: Mild prevertebral soft tissue swelling is present. Vascular calcifications are noted at the carotid bifurcations bilaterally. The airway is patent. No radiopaque foreign body is present. The lung apices are clear. Mediastinal adenopathy is again noted.  IMPRESSION: 1. Mild prevertebral soft tissue swelling raises concern for pharyngitis or edema. 2. Atherosclerotic calcifications at the carotid bifurcations. 3. Soft tissue in the superior mediastinum compatible with previously seen adenopathy.   Electronically Signed   By: San Morelle M.D.   On: 09/05/2014 18:56   Dg Chest 2 View  09/05/2014   CLINICAL DATA:  Dysphagia for 4 days, difficulty swallowing fluid  EXAM: CHEST - 2 VIEW  COMPARISON:  08/14/2014  FINDINGS: Cardiac shadow is stable. The lungs are clear bilaterally. No acute bony abnormality is noted. Mild aortic calcifications are seen.  IMPRESSION: Aortic atherosclerotic disease.  No acute abnormality noted.   Electronically Signed   By: Inez Catalina M.D.   On:  09/05/2014 18:52    Medications:  Prior to Admission:  Prescriptions prior to admission  Medication Sig Dispense Refill Last Dose  . amLODipine (NORVASC) 5 MG tablet Take 5 mg by mouth daily.   09/05/2014  . diphenhydrAMINE (BENADRYL) 25 MG tablet Take 1-2 tablets (25-50 mg total) by mouth every 8 (eight) hours as needed for itching. (Patient taking differently: Take 50 mg by mouth every 8 (eight) hours as needed for itching. ) 45 tablet 0 09/04/2014  . diphenhydramine-acetaminophen (TYLENOL PM) 25-500 MG TABS Take 2 tablets by mouth every 8 (eight) hours as needed.   09/05/2014  . tobramycin-dexamethasone (TOBRADEX) ophthalmic solution Place 1 drop into both eyes 2 (two) times daily.   09/05/2014  . triamcinolone cream (KENALOG) 0.1 % Apply 1 application topically 4 (four) times daily as needed. 30 g 1    Scheduled: . amLODipine  5 mg Oral Daily  . famotidine (PEPCID) IV  20 mg Intravenous Q12H  . heparin  5,000 Units Subcutaneous 3 times per day  . methylPREDNISolone (SOLU-MEDROL) injection  125 mg Intravenous Q6H  . nicotine  14  mg Transdermal Daily   Continuous: . sodium chloride 100 mL/hr at 09/06/14 0807   QFD:[id number] **OR** acetaminophen, diphenhydrAMINE, hydrALAZINE, iohexol, ondansetron **OR** ondansetron (ZOFRAN) IV, oxyCODONE  Assesment: She is admitted with a generalized allergic reaction. It's not clear what this is from. She feels minimally better. She had what looked like some soft tissue swelling on her soft tissue films of her neck so I'm going to have her get a CT of the soft tissues of her neck. She did have problems after she had hair coloring applied but she says she has also eaten shrimp 3 times in the last 2 weeks or so. Active Problems:   Dysphagia   Fatigue   Paget's bone disease   Essential hypertension   Allergic reaction   Renal insufficiency   Leukocytosis    Plan: Increase Solu-Medrol. Add Pepcid IV. CT of the soft tissues of the neck. She has a  number of blood tests that are pending and may help [place] with the diagnosis      Celsa Nordahl L 09/06/2014, 10:17 AM

## 2014-09-06 NOTE — Progress Notes (Signed)
Notified Dr. Luan Pulling of the CT scan results of today.  MD spoke with the patient over the telephone.  No new orders for me at this time.

## 2014-09-07 LAB — SJOGRENS SYNDROME-A EXTRACTABLE NUCLEAR ANTIBODY

## 2014-09-07 LAB — SJOGRENS SYNDROME-B EXTRACTABLE NUCLEAR ANTIBODY

## 2014-09-07 LAB — ANTI-SMITH ANTIBODY

## 2014-09-07 LAB — MITOCHONDRIAL ANTIBODIES: Mitochondrial M2 Ab, IgG: 4.8 Units (ref 0.0–20.0)

## 2014-09-07 LAB — RHEUMATOID FACTOR: Rhuematoid fact SerPl-aCnc: 9.7 IU/mL (ref 0.0–13.9)

## 2014-09-07 LAB — ANTI-DNA ANTIBODY, DOUBLE-STRANDED

## 2014-09-07 MED ORDER — AMLODIPINE BESYLATE 10 MG PO TABS
10.0000 mg | ORAL_TABLET | Freq: Every day | ORAL | Status: DC
  Administered 2014-09-08 – 2014-09-12 (×5): 10 mg via ORAL
  Filled 2014-09-07 (×2): qty 2
  Filled 2014-09-07 (×2): qty 1
  Filled 2014-09-07: qty 2
  Filled 2014-09-07: qty 1

## 2014-09-07 MED ORDER — GUAIFENESIN 100 MG/5ML PO SOLN
200.0000 mg | Freq: Three times a day (TID) | ORAL | Status: DC | PRN
  Administered 2014-09-07 – 2014-09-08 (×3): 200 mg via ORAL
  Filled 2014-09-07: qty 5
  Filled 2014-09-07: qty 10
  Filled 2014-09-07: qty 5
  Filled 2014-09-07: qty 10

## 2014-09-07 MED ORDER — TOBRAMYCIN 0.3 % OP SOLN
1.0000 [drp] | Freq: Two times a day (BID) | OPHTHALMIC | Status: DC
  Administered 2014-09-07 – 2014-09-12 (×6): 1 [drp] via OPHTHALMIC
  Filled 2014-09-07 (×3): qty 5

## 2014-09-07 MED ORDER — AMLODIPINE BESYLATE 5 MG PO TABS
5.0000 mg | ORAL_TABLET | Freq: Once | ORAL | Status: AC
Start: 2014-09-07 — End: 2014-09-07
  Administered 2014-09-07: 5 mg via ORAL

## 2014-09-07 NOTE — Progress Notes (Signed)
Notified Dr. Legrand Rams of the patients request for cough syrup and her home eye drops.  I also notified Dr. Legrand Rams of the patients SBP range 190-200's even with the current prescribed interventions.  Patient appears to be stable at this time. New orders given and followed.

## 2014-09-07 NOTE — Evaluation (Signed)
Clinical/Bedside Swallow Evaluation Patient Details  Name: Megan Sims MRN: [id number] Date of Birth: [birth year]  Today's Date: 09/07/2014 Time: SLP Start Time (ACUTE ONLY): 1411 SLP Stop Time (ACUTE ONLY): 1448 SLP Time Calculation (min) (ACUTE ONLY): 37 min  Past Medical History:  Past Medical History  Diagnosis Date  . Migraine   . Lung abnormality   . Hypertension   . Pleurisy   . Cerebral aneurysm 2 brain surgeries 96 or 97  . Paget disease of bone   . Diarrhea    Past Surgical History:  Past Surgical History  Procedure Laterality Date  . Chest tube insertion    . Tubal ligation    . Cerebral aneurysm repair  79 or 34   HPI:  Megan Sims is a 59 y.o. female with past medical history of HTN, cerebral aneurysm, Paget disease of the bone, Pleurisy, and lung abnormality. Pt initially presented  3-4 weeks ago after developing burning scalp and sores shortly after getting her hair done. Went to [employer] [facility] and then came here as she was not any better. States she felt better for the first 2 days after coming to ED on 08/21/14. Since that time has developed increasing fatigue, weakness, difficulty swallowing food, and neck and shoulder pain. Skin whelps never fully resolved but improved on steroids. Significant worsening over past 2 days. Last dose of steroids ('20mg'$ ) 2 days ago. [name] like she has "pepper on my tongue." ST consulted for bedside swallow examination. Chest x ray negative for acute abnormalities. CT of Neck significant for Nonspecific generalized pharyngeal mucosal swelling and   Assessment / Plan / Recommendation Clinical Impression  Pt presents with suspected mild oral and moderate pharyngeal dysphagia. Although no history of dysphagia, recent CT of neck confirmed pharyngeal musculutare swelling. This evidenced during BSE characterized by piece meal deglutition, consistent throat clearing and intermittent coughing concerning for decreased airway protection and  inadequate  bolus propulsion throughout pharyngeal area. Pt reports poor tolerance of solid, minced, and chopped consistencies often having to expectoriate from throat. Thin liquid trials via straw, cup, and teaspoon all exhibited signs and symptoms of decreased airway protection. Nectar thick liquids trials showed reduction in symptoms with mild throat clearing. Recommend dysphagia 1 diet and nectar thick liquids. Medicines crushed with puree. Intermittent supervision warranted during meals. Objective swallow study indicated. Will attempt MBS tomorrow.     Aspiration Risk  Moderate    Diet Recommendation Dysphagia 1 (Puree);Nectar   Medication Administration: Crushed with puree Compensations: Small sips/bites;Slow rate;Follow solids with liquid;Multiple dry swallows after each bite/sip    Other  Recommendations Oral Care Recommendations: Oral care BID Other Recommendations: Order thickener from pharmacy   Follow Up Recommendations       Frequency and Duration min 2x/week  2 weeks   Pertinent Vitals/Pain     SLP Swallow Goals     Swallow Study Prior Functional Status       General Date of Onset: 09/07/14 Other Pertinent Information: Megan Sims is a 59 y.o. female with past medical history of HTN, cerebral aneurysm, Paget disease of the bone, Pleurisy, and lung abnormality. Pt initially presented  3-4 weeks ago after developing burning scalp and sores shortly after getting her hair done. Went to [employer] and then came here as she was not any better. States she felt better for the first 2 days after coming to ED on 08/21/14. Since that time has developed increasing fatigue, weakness, difficulty swallowing food, and neck and shoulder pain. Skin  whelps never fully resolved but improved on steroids. Significant worsening over past 2 days. Last dose of steroids ('20mg'$ ) 2 days ago. [name] like she has "pepper on my tongue." ST consulted for bedside swallow examination. Chest x ray negative for acute  abnormalities. CT of Neck significant for Nonspecific generalized pharyngeal mucosal swelling and Type of Study: Bedside swallow evaluation Diet Prior to this Study: Regular;Thin liquids Temperature Spikes Noted: No Respiratory Status: Room air History of Recent Intubation: No Behavior/Cognition: Alert;Cooperative;Pleasant mood Oral Cavity - Dentition: Missing dentition;Adequate natural dentition/normal for age Self-Feeding Abilities: Able to feed self Patient Positioning: Upright in bed Baseline Vocal Quality: Normal;Low vocal intensity Volitional Cough: Weak Volitional Swallow: Able to elicit    Oral/Motor/Sensory Function Overall Oral Motor/Sensory Function: Appears within functional limits for tasks assessed Labial ROM: Within Functional Limits Labial Symmetry: Within Functional Limits Labial Strength: Within Functional Limits Labial Sensation: Within Functional Limits Lingual ROM: Within Functional Limits Facial Symmetry: Within Functional Limits   Ice Chips Ice chips: Impaired Presentation: Spoon;Self Fed Oral Phase Functional Implications: Prolonged oral transit Pharyngeal Phase Impairments: Suspected delayed Swallow;Wet Vocal Quality;Throat Clearing - Immediate;Cough - Delayed   Thin Liquid Thin Liquid: Impaired Presentation: Cup;Straw;Spoon Oral Phase Impairments: Reduced lingual movement/coordination Oral Phase Functional Implications: Prolonged oral transit Pharyngeal  Phase Impairments: Suspected delayed Swallow;Multiple swallows;Wet Vocal Quality;Cough - Immediate;Throat Clearing - Immediate    Nectar Thick Nectar Thick Liquid: Impaired Presentation: Cup;Spoon Pharyngeal Phase Impairments: Throat Clearing - Delayed;Multiple swallows   Honey Thick Honey Thick Liquid: Not tested   Puree Puree: Impaired Oral Phase Impairments: Reduced lingual movement/coordination Pharyngeal Phase Impairments: Multiple swallows;Throat Clearing - Delayed   Solid   GO    Solid: Not  tested      Arvil Chaco MA, CCC-SLP Acute Care Speech Language Pathologist    Levi Aland 09/07/2014,4:55 PM

## 2014-09-07 NOTE — Progress Notes (Signed)
Subjective: Patient was admitted due to generalized body rash, dysphagia and face swelling. Patient is on IV steroid and feels slightly better.  Objective: Vital signs in last 24 hours: Temp:  [97.7 F (36.5 C)-98.4 F (36.9 C)] 97.8 F (36.6 C) (08/15 0619) Pulse Rate:  [91-99] 95 (08/15 0619) Resp:  [20] 20 (08/15 0619) BP: (181-223)/(87-110) 220/110 mmHg (08/15 0722) SpO2:  [94 %-100 %] 100 % (08/15 0619) Weight change:  Last BM Date: 09/06/14  Intake/Output from previous day: 08/14 0701 - 08/15 0700 In: 360 [P.O.:360] Out: 1100 [Urine:1100]  PHYSICAL EXAM General appearance: alert and no distress Resp: clear to auscultation bilaterally Cardio: S1, S2 normal GI: soft, non-tender; bowel sounds normal; no masses,  no organomegaly Extremities: extremities normal, atraumatic, no cyanosis or edema  Lab Results:  Results for orders placed or performed during the hospital encounter of 09/05/14 (from the past 48 hour(s))  Urinalysis, Routine w reflex microscopic (not at [facility])     Status: Abnormal   Collection Time: 09/05/14  6:00 PM  Result Value Ref Range   Color, Urine YELLOW YELLOW   APPearance HAZY (A) CLEAR   Specific Gravity, Urine 1.025 1.005 - 1.030   pH 6.5 5.0 - 8.0   Glucose, UA NEGATIVE NEGATIVE mg/dL   Hgb urine dipstick NEGATIVE NEGATIVE   Bilirubin Urine NEGATIVE NEGATIVE   Ketones, ur NEGATIVE NEGATIVE mg/dL   Protein, ur TRACE (A) NEGATIVE mg/dL   Urobilinogen, UA 0.2 0.0 - 1.0 mg/dL   Nitrite NEGATIVE NEGATIVE   Leukocytes, UA MODERATE (A) NEGATIVE  Urine microscopic-add on     Status: Abnormal   Collection Time: 09/05/14  6:00 PM  Result Value Ref Range   Squamous Epithelial / LPF FEW (A) RARE   WBC, UA 21-50 <3 WBC/hpf   RBC / HPF 3-6 <3 RBC/hpf   Bacteria, UA MANY (A) RARE   Urine-Other MUCOUS PRESENT   CBC with Differential     Status: Abnormal   Collection Time: 09/05/14  6:05 PM  Result Value Ref Range   WBC 12.3 (H) 4.0 - 10.5 K/uL   RBC  4.78 3.87 - 5.11 MIL/uL   Hemoglobin 14.7 12.0 - 15.0 g/dL   HCT 43.8 36.0 - 46.0 %   MCV 91.6 78.0 - 100.0 fL   MCH 30.8 26.0 - 34.0 pg   MCHC 33.6 30.0 - 36.0 g/dL   RDW 16.7 (H) 11.5 - 15.5 %   Platelets 200 150 - 400 K/uL   Neutrophils Relative % 77 43 - 77 %   Neutro Abs 9.5 (H) 1.7 - 7.7 K/uL   Lymphocytes Relative 16 12 - 46 %   Lymphs Abs 2.0 0.7 - 4.0 K/uL   Monocytes Relative 6 3 - 12 %   Monocytes Absolute 0.7 0.1 - 1.0 K/uL   Eosinophils Relative 1 0 - 5 %   Eosinophils Absolute 0.1 0.0 - 0.7 K/uL   Basophils Relative 0 0 - 1 %   Basophils Absolute 0.0 0.0 - 0.1 K/uL  Comprehensive metabolic panel     Status: Abnormal   Collection Time: 09/05/14  6:05 PM  Result Value Ref Range   Sodium 138 135 - 145 mmol/L   Potassium 4.4 3.5 - 5.1 mmol/L   Chloride 105 101 - 111 mmol/L   CO2 24 22 - 32 mmol/L   Glucose, Bld 97 65 - 99 mg/dL   BUN 26 (H) 6 - 20 mg/dL   Creatinine, Ser 1.25 (H) 0.44 - 1.00 mg/dL  Calcium 8.8 (L) 8.9 - 10.3 mg/dL   Total Protein 6.5 6.5 - 8.1 g/dL   Albumin 3.2 (L) 3.5 - 5.0 g/dL   AST 49 (H) 15 - 41 U/L   ALT 27 14 - 54 U/L   Alkaline Phosphatase 175 (H) 38 - 126 U/L   Total Bilirubin 0.6 0.3 - 1.2 mg/dL   GFR calc non Af Amer 46 (L) >60 mL/min   GFR calc Af Amer 53 (L) >60 mL/min    Comment: (NOTE) The eGFR has been calculated using the CKD EPI equation. This calculation has not been validated in all clinical situations. eGFR's persistently <60 mL/min signify possible Chronic Kidney Disease.    Anion gap 9 5 - 15  C-reactive protein     Status: None   Collection Time: 09/05/14  7:33 PM  Result Value Ref Range   CRP 0.9 <1.0 mg/dL    Comment: Performed at [facility]  Sedimentation rate     Status: None   Collection Time: 09/05/14  7:33 PM  Result Value Ref Range   Sed Rate 17 0 - 22 mm/hr  CBC     Status: Abnormal   Collection Time: 09/05/14  7:33 PM  Result Value Ref Range   WBC 12.4 (H) 4.0 - 10.5 K/uL   RBC 4.87 3.87 -  5.11 MIL/uL   Hemoglobin 14.6 12.0 - 15.0 g/dL   HCT 44.7 36.0 - 46.0 %   MCV 91.8 78.0 - 100.0 fL   MCH 30.0 26.0 - 34.0 pg   MCHC 32.7 30.0 - 36.0 g/dL   RDW 16.8 (H) 11.5 - 15.5 %   Platelets 227 150 - 400 K/uL  Creatinine, serum     Status: Abnormal   Collection Time: 09/05/14  7:33 PM  Result Value Ref Range   Creatinine, Ser 1.19 (H) 0.44 - 1.00 mg/dL   GFR calc non Af Amer 49 (L) >60 mL/min   GFR calc Af Amer 57 (L) >60 mL/min    Comment: (NOTE) The eGFR has been calculated using the CKD EPI equation. This calculation has not been validated in all clinical situations. eGFR's persistently <60 mL/min signify possible Chronic Kidney Disease.   Ketones, urine     Status: None   Collection Time: 09/06/14 12:11 AM  Result Value Ref Range   Ketones, ur TRACE NEGATIVE mg/dL  CBC     Status: Abnormal   Collection Time: 09/06/14  6:12 AM  Result Value Ref Range   WBC 8.3 4.0 - 10.5 K/uL   RBC 4.56 3.87 - 5.11 MIL/uL   Hemoglobin 14.0 12.0 - 15.0 g/dL   HCT 41.9 36.0 - 46.0 %   MCV 91.9 78.0 - 100.0 fL   MCH 30.7 26.0 - 34.0 pg   MCHC 33.4 30.0 - 36.0 g/dL   RDW 16.7 (H) 11.5 - 15.5 %   Platelets 203 150 - 400 K/uL  Comprehensive metabolic panel     Status: Abnormal   Collection Time: 09/06/14  6:12 AM  Result Value Ref Range   Sodium 133 (L) 135 - 145 mmol/L   Potassium 4.6 3.5 - 5.1 mmol/L   Chloride 102 101 - 111 mmol/L   CO2 22 22 - 32 mmol/L   Glucose, Bld 153 (H) 65 - 99 mg/dL   BUN 26 (H) 6 - 20 mg/dL   Creatinine, Ser 0.99 0.44 - 1.00 mg/dL   Calcium 8.6 (L) 8.9 - 10.3 mg/dL   Total Protein 6.3 (L) 6.5 -  8.1 g/dL   Albumin 3.0 (L) 3.5 - 5.0 g/dL   AST 43 (H) 15 - 41 U/L   ALT 27 14 - 54 U/L   Alkaline Phosphatase 173 (H) 38 - 126 U/L   Total Bilirubin 0.4 0.3 - 1.2 mg/dL   GFR calc non Af Amer >60 >60 mL/min   GFR calc Af Amer >60 >60 mL/min    Comment: (NOTE) The eGFR has been calculated using the CKD EPI equation. This calculation has not been validated  in all clinical situations. eGFR's persistently <60 mL/min signify possible Chronic Kidney Disease.    Anion gap 9 5 - 15    ABGS No results for input(s): PHART, PO2ART, TCO2, HCO3 in the last 72 hours.  Invalid input(s): PCO2 CULTURES No results found for this or any previous visit (from the past 240 hour(s)). Studies/Results: Dg Neck Soft Tissue  09/05/2014   CLINICAL DATA:  Dysphagia.  Difficulty swallowing food.  EXAM: NECK SOFT TISSUES - 1+ VIEW  COMPARISON:  None.  FINDINGS: Mild prevertebral soft tissue swelling is present. Vascular calcifications are noted at the carotid bifurcations bilaterally. The airway is patent. No radiopaque foreign body is present. The lung apices are clear. Mediastinal adenopathy is again noted.  IMPRESSION: 1. Mild prevertebral soft tissue swelling raises concern for pharyngitis or edema. 2. Atherosclerotic calcifications at the carotid bifurcations. 3. Soft tissue in the superior mediastinum compatible with previously seen adenopathy.   Electronically Signed   By: San Morelle M.D.   On: 09/05/2014 18:56   Dg Chest 2 View  09/05/2014   CLINICAL DATA:  Dysphagia for 4 days, difficulty swallowing fluid  EXAM: CHEST - 2 VIEW  COMPARISON:  08/14/2014  FINDINGS: Cardiac shadow is stable. The lungs are clear bilaterally. No acute bony abnormality is noted. Mild aortic calcifications are seen.  IMPRESSION: Aortic atherosclerotic disease.  No acute abnormality noted.   Electronically Signed   By: Inez Catalina M.D.   On: 09/05/2014 18:52   Ct Soft Tissue Neck W Contrast  09/06/2014   CLINICAL DATA:  59 year old female with scalp soft tissue swelling and source. Neck pain, fatigue, weakness, difficulty swallowing. Symptoms originally 3 weeks ago, with some improvement after steroids. Subsequent encounter.  EXAM: CT NECK WITH CONTRAST  TECHNIQUE: Multidetector CT imaging of the neck was performed using the standard protocol following the bolus administration of  intravenous contrast.  CONTRAST:  72m OMNIPAQUE IOHEXOL 300 MG/ML  SOLN  COMPARISON:  Neck soft tissue radiographs 09/05/2014. Chest CT 08/14/2014.  FINDINGS: Pharynx and larynx: Pharyngeal motion artifact, requiring some of the neck images to be repeated.  There is moderate prevertebral/retropharyngeal soft tissue thickening which appears in part related to effusion. Fluid is most apparent in the midline along the anterior C2 and C3 vertebrae. There is overlying generalized pharyngeal mucosal thickening. There is no C2-C3 disc space loss or endplate destruction associated. No significant adenoid or tonsillar pillar enlargement. Parapharyngeal spaces are within normal limits.  Epiglottis and larynx are within normal limits, but there is persistent retropharyngeal/prevertebral effusion at the level of the larynx.  Salivary glands: Submandibular glands, sublingual space, and both parotid glands appear within normal limits.  Thyroid: Negative.  Lymph nodes: Bilateral level 1, level 2, and level 3 cervical lymph nodes are nonenlarged.  However, there is abnormal nodal enlargement at the right thoracic inlet, level 4 (series 2, image 67 and coronal image 52). Abnormal nodes here measure up to 13 mm short axis. This appears mildly progressed since the  July chest exam. See also upper chest findings below.  Vascular: Major vascular structures in the neck and at the skullbase appear to remain patent.  Limited intracranial: Sequelae of right frontotemporal craniotomy/ craniectomy partially re- demonstrated. 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 Chronic right anterior temporal lobe encephalomalacia.  Visualized orbits: Negative.  Mastoids and visualized paranasal sinuses: Chronic right lamina papyracea fracture. Visualized paranasal sinuses and mastoids are clear.  Skeleton: Postoperative changes to the right skull. No acute or suspicious osseous lesion identified.  Upper chest: Some pulmonary  centrilobular emphysema is evident. Superior mediastinal lymphadenopathy has mildly progressed since July, with individual nodes measuring up to 2 cm short axis. No axillary lymphadenopathy. No upper lung nodule or mass identified (see also chest CT report 08/14/2014).  IMPRESSION: 1. 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 The right level 4 node located adjacent to the lower right IJ should be amenable to ultrasound-guided needle biopsy (could be performed at [facility] or as an outpatient). 2. Nonspecific generalized pharyngeal mucosal swelling and retropharyngeal/prevertebral effusion. No CT changes of discitis osteomyelitis in the adjacent cervical spine, and no associated cervical lymphadenopathy. In light of #1, consider a paraneoplastic syndrome. Pharyngeal or prevertebral infection seems less likely.   Electronically Signed   By: Genevie Ann M.D.   On: 09/06/2014 12:07    Medications: I have reviewed the patient's current medications.  Assesment:   Active Problems:   Dysphagia   Fatigue   Paget's bone disease   Essential hypertension   Allergic reaction   Renal insufficiency   Leukocytosis    Plan:  Medications reviewed Will continue IV steroid Will arrange for lymph node biopsy on outpatient. Cbc/BMP   LOS: 1 day   Jing Howatt 09/07/2014, 8:13 AM

## 2014-09-07 NOTE — Plan of Care (Signed)
Problem: Phase II Progression Outcomes Goal: Progress activity as tolerated unless otherwise ordered Outcome: Progressing Patient is OOB Ad Lib. Goal: Discharge plan established Outcome: Progressing Discussed the plan that Dr. Legrand Rams said that the biopsy would scheduled for out patient.

## 2014-09-07 NOTE — Care Management Note (Signed)
Case Management Note  Patient Details  Name: Megan Sims MRN: [id number] Date of Birth: 08/14/1955  Subjective/Objective:                  Pt admitted from home with rash, allergic reaction and new found cancer. Pt lives with family and will return home at discharge. Pt is independent with ADL's.  Action/Plan: No Cm needs noted at this time.  Expected Discharge Date:                  Expected Discharge Plan:  Home/Self Care  In-House Referral:  NA  Discharge planning Services  CM Consult  Post Acute Care Choice:  NA Choice offered to:  NA  DME Arranged:    DME Agency:     HH Arranged:    HH Agency:     Status of Service:  Completed, signed off  Medicare Important Message Given:    Date Medicare IM Given:    Medicare IM give by:    Date Additional Medicare IM Given:    Additional Medicare Important Message give by:     If discussed at [place] of Stay Meetings, dates discussed:    Additional Comments:  Joylene Draft, RN 09/07/2014, 11:43 AM

## 2014-09-07 NOTE — Plan of Care (Signed)
Problem: Phase II Progression Outcomes Goal: Vital signs remain stable Outcome: Not Progressing Working on the patients blood pressure PRN and Scheduled medication has been given.

## 2014-09-08 ENCOUNTER — Inpatient Hospital Stay (HOSPITAL_COMMUNITY)
Admit: 2014-09-08 | Discharge: 2014-09-08 | Disposition: A | Discharge status: Home / Self Care | Payer: Medicaid Other | Attending: Internal Medicine | Admitting: Internal Medicine

## 2014-09-08 ENCOUNTER — Inpatient Hospital Stay (HOSPITAL_COMMUNITY): Payer: Medicaid Other

## 2014-09-08 LAB — CBC
HEMATOCRIT: 39.4 % (ref 36.0–46.0)
Hemoglobin: 13 g/dL (ref 12.0–15.0)
MCH: 29.3 pg (ref 26.0–34.0)
MCHC: 33 g/dL (ref 30.0–36.0)
MCV: 88.9 fL (ref 78.0–100.0)
PLATELETS: 209 10*3/uL (ref 150–400)
RBC: 4.43 MIL/uL (ref 3.87–5.11)
RDW: 16.3 % — AB (ref 11.5–15.5)
WBC: 11.6 10*3/uL — ABNORMAL HIGH (ref 4.0–10.5)

## 2014-09-08 LAB — BASIC METABOLIC PANEL
Anion gap: 7 (ref 5–15)
BUN: 23 mg/dL — AB (ref 6–20)
CALCIUM: 8.1 mg/dL — AB (ref 8.9–10.3)
CO2: 24 mmol/L (ref 22–32)
CREATININE: 0.83 mg/dL (ref 0.44–1.00)
Chloride: 102 mmol/L (ref 101–111)
GFR calc Af Amer: >60 mL/min (ref >60)
GFR calc non Af Amer: >60 mL/min (ref >60)
GLUCOSE: 141 mg/dL — AB (ref 65–99)
Potassium: 3.8 mmol/L (ref 3.5–5.1)
Sodium: 133 mmol/L — ABNORMAL LOW (ref 135–145)

## 2014-09-08 LAB — TRYPTASE: TRYPTASE: 7.9 ug/L (ref <11)

## 2014-09-08 NOTE — Progress Notes (Signed)
MBS report now available under imaging section of the notes.   Arvil Chaco MA, [facility] Speech Language Pathologist

## 2014-09-08 NOTE — Progress Notes (Signed)
Subjective: Patient feels better today. Her rash and face swelling is improving. She is swallowing better. She was evaluated by specech therapist and recommendation was made.  Objective: Vital signs in last 24 hours: Temp:  [97.7 F (36.5 C)-97.8 F (36.6 C)] 97.7 F (36.5 C) (08/16 0438) Pulse Rate:  [87-95] 88 (08/16 0621) Resp:  [18-20] 18 (08/16 0438) BP: (165-210)/(77-108) 169/77 mmHg (08/16 0621) SpO2:  [97 %-100 %] 100 % (08/16 0438) Weight change:  Last BM Date: 09/06/14  Intake/Output from previous day: 08/15 0701 - 08/16 0700 In: 1970 [P.O.:720; I.V.:1200; IV Piggyback:50] Out: 4 [Urine:4]  PHYSICAL EXAM General appearance: alert and no distress Resp: clear to auscultation bilaterally Cardio: S1, S2 normal GI: soft, non-tender; bowel sounds normal; no masses,  no organomegaly Extremities: extremities normal, atraumatic, no cyanosis or edema  Lab Results:  Results for orders placed or performed during the hospital encounter of 09/05/14 (from the past 48 hour(s))  CBC     Status: Abnormal   Collection Time: 09/08/14  6:31 AM  Result Value Ref Range   WBC 11.6 (H) 4.0 - 10.5 K/uL   RBC 4.43 3.87 - 5.11 MIL/uL   Hemoglobin 13.0 12.0 - 15.0 g/dL   HCT 39.4 36.0 - 46.0 %   MCV 88.9 78.0 - 100.0 fL   MCH 29.3 26.0 - 34.0 pg   MCHC 33.0 30.0 - 36.0 g/dL   RDW 16.3 (H) 11.5 - 15.5 %   Platelets 209 150 - 400 K/uL  Basic metabolic panel     Status: Abnormal   Collection Time: 09/08/14  6:31 AM  Result Value Ref Range   Sodium 133 (L) 135 - 145 mmol/L   Potassium 3.8 3.5 - 5.1 mmol/L   Chloride 102 101 - 111 mmol/L   CO2 24 22 - 32 mmol/L   Glucose, Bld 141 (H) 65 - 99 mg/dL   BUN 23 (H) 6 - 20 mg/dL   Creatinine, Ser 0.83 0.44 - 1.00 mg/dL   Calcium 8.1 (L) 8.9 - 10.3 mg/dL   GFR calc non Af Amer >60 >60 mL/min   GFR calc Af Amer >60 >60 mL/min    Comment: (NOTE) The eGFR has been calculated using the CKD EPI equation. This calculation has not been validated  in all clinical situations. eGFR's persistently <60 mL/min signify possible Chronic Kidney Disease.    Anion gap 7 5 - 15    ABGS No results for input(s): PHART, PO2ART, TCO2, HCO3 in the last 72 hours.  Invalid input(s): PCO2 CULTURES No results found for this or any previous visit (from the past 240 hour(s)). Studies/Results: Ct Soft Tissue Neck W Contrast  09/06/2014   CLINICAL DATA:  59 year old female with scalp soft tissue swelling and source. Neck pain, fatigue, weakness, difficulty swallowing. Symptoms originally 3 weeks ago, with some improvement after steroids. Subsequent encounter.  EXAM: CT NECK WITH CONTRAST  TECHNIQUE: Multidetector CT imaging of the neck was performed using the standard protocol following the bolus administration of intravenous contrast.  CONTRAST:  70m OMNIPAQUE IOHEXOL 300 MG/ML  SOLN  COMPARISON:  Neck soft tissue radiographs 09/05/2014. Chest CT 08/14/2014.  FINDINGS: Pharynx and larynx: Pharyngeal motion artifact, requiring some of the neck images to be repeated.  There is moderate prevertebral/retropharyngeal soft tissue thickening which appears in part related to effusion. Fluid is most apparent in the midline along the anterior C2 and C3 vertebrae. There is overlying generalized pharyngeal mucosal thickening. There is no C2-C3 disc space loss or endplate destruction  associated. No significant adenoid or tonsillar pillar enlargement. Parapharyngeal spaces are within normal limits.  Epiglottis and larynx are within normal limits, but there is persistent retropharyngeal/prevertebral effusion at the level of the larynx.  Salivary glands: Submandibular glands, sublingual space, and both parotid glands appear within normal limits.  Thyroid: Negative.  Lymph nodes: Bilateral level 1, level 2, and level 3 cervical lymph nodes are nonenlarged.  However, there is abnormal nodal enlargement at the right thoracic inlet, level 4 (series 2, image 67 and coronal image 52).  Abnormal nodes here measure up to 13 mm short axis. This appears mildly progressed since the July chest exam. See also upper chest findings below.  Vascular: Major vascular structures in the neck and at the skullbase appear to remain patent.  Limited intracranial: Sequelae of right frontotemporal craniotomy/ craniectomy partially re- demonstrated. 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 Chronic right anterior temporal lobe encephalomalacia.  Visualized orbits: Negative.  Mastoids and visualized paranasal sinuses: Chronic right lamina papyracea fracture. Visualized paranasal sinuses and mastoids are clear.  Skeleton: Postoperative changes to the right skull. No acute or suspicious osseous lesion identified.  Upper chest: Some pulmonary centrilobular emphysema is evident. Superior mediastinal lymphadenopathy has mildly progressed since July, with individual nodes measuring up to 2 cm short axis. No axillary lymphadenopathy. No upper lung nodule or mass identified (see also chest CT report 08/14/2014).  IMPRESSION: 1. 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 The right level 4 node located adjacent to the lower right IJ should be amenable to ultrasound-guided needle biopsy (could be performed at [facility] or as an outpatient). 2. Nonspecific generalized pharyngeal mucosal swelling and retropharyngeal/prevertebral effusion. No CT changes of discitis osteomyelitis in the adjacent cervical spine, and no associated cervical lymphadenopathy. In light of #1, consider a paraneoplastic syndrome. Pharyngeal or prevertebral infection seems less likely.   Electronically Signed   By: Genevie Ann M.D.   On: 09/06/2014 12:07    Medications: I have reviewed the patient's current medications.  Assesment:   Active Problems:   Dysphagia   Fatigue   Paget's bone disease   Essential hypertension    Allergic reaction   Renal insufficiency   Leukocytosis    Plan:  Medications reviewed Will continue IV steroid Continue current treatment.  LOS: 2 days   Nou Chard 09/08/2014, 8:09 AM

## 2014-09-08 NOTE — Progress Notes (Signed)
Pt is refusing nicotine patch and requesting the nicotine gum. Spoke with Dr.Fagan who stated that nicotine patch is the only option at this time. Pt also believes that the tobramycin eye drops may be the cause of the swelling. Nurse spoke with pt regarding the purpose of the eye drops and she still wants to see if they can be stopped. Dr.Fagan said to just hold them for tonight and let Dr.Fanta address it in the morning. Will continue to monitor.

## 2014-09-09 LAB — GLUCOSE, CAPILLARY: Glucose-Capillary: 136 mg/dL — ABNORMAL HIGH (ref 65–99)

## 2014-09-09 LAB — MRSA PCR SCREENING: MRSA by PCR: NEGATIVE

## 2014-09-09 MED ORDER — RESOURCE THICKENUP CLEAR PO POWD
ORAL | Status: DC | PRN
  Administered 2014-09-11: 21:00:00 via ORAL
  Filled 2014-09-09: qty 125

## 2014-09-09 NOTE — Consult Note (Signed)
Reason for Consult: dysphagoa Referring Physician:   [name] is an 59 y.o. female.  HPI: Admitted thru the ED Saturday. She was hurting in the back of her neck, trouble swallowing and she had a rt ear ache. Symptoms x 2 weeks. She says he was having trouble swallowing which progressively worsened. Denies prior symptoms of dysphagia. She also c/o left shoulder pain.  There had been no fever.  Up until 2 weeks ago she could eat anything she wanted.  She says she feels better today.   She was seen at [facility] 08/14/2014 for a rash. She was given Prednisone and Benadryl.  She tells me she had had her hair dyed about 3 days before going to [facility]. She said her scalped burned and she had facial swelling.  She is not taking any medication at home. She underwent a CT soft tissue of the neck 09/06/2014 which revealed:1. 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 The right level 4 node located adjacent to the lower right IJ should be amenable to ultrasound-guided needle biopsy (could be performed at [facility] or as an outpatient). 2. Nonspecific generalized pharyngeal mucosal swelling and retropharyngeal/prevertebral effusion. No CT changes of discitis osteomyelitis in the adjacent cervical spine, and no associated cervical lymphadenopathy. In light of #1, consider a paraneoplastic syndrome. Pharyngeal or prevertebral infection seems less likely. She also underwent an Swallow evaluation.  See below.  Over the past year she has lost about 10 pounds. There has been no nausea or vomiting.  No BM since Saturday. Usually has a BM daily or every other day.  Last colonoscopy about 7 yrs ago at [facility] and she reports it was normal. Usually smokes a 5-6 cigarettes a day.   Past Medical History  Diagnosis Date  . Migraine   . Lung abnormality   . Hypertension   . Pleurisy   . Cerebral aneurysm 2 brain  surgeries 96 or 97  . Paget disease of bone   . Diarrhea     Past Surgical History  Procedure Laterality Date  . Chest tube insertion    . Tubal ligation    . Cerebral aneurysm repair  96 or 56    Family History  Problem Relation Age of Onset  . Hypertension    . Diabetes    . Kidney disease      Social History:  reports that she has been smoking Cigarettes.  She has a 17 pack-year smoking history. She does not have any smokeless tobacco history on file. She reports that she does not drink alcohol or use illicit drugs.  Allergies: No Known Allergies  Medications: I have reviewed the patient's current medications.  Results for orders placed or performed during the hospital encounter of 09/05/14 (from the past 48 hour(s))  CBC     Status: Abnormal   Collection Time: 09/08/14  6:31 AM  Result Value Ref Range   WBC 11.6 (H) 4.0 - 10.5 K/uL   RBC 4.43 3.87 - 5.11 MIL/uL   Hemoglobin 13.0 12.0 - 15.0 g/dL   HCT 39.4 36.0 - 46.0 %   MCV 88.9 78.0 - 100.0 fL   MCH 29.3 26.0 - 34.0 pg   MCHC 33.0 30.0 - 36.0 g/dL   RDW 16.3 (H) 11.5 - 15.5 %   Platelets 209 150 - 400 K/uL  Basic metabolic panel     Status: Abnormal   Collection Time: 09/08/14  6:31 AM  Result Value Ref Range   Sodium 133 (L) 135 - 145 mmol/L   Potassium 3.8 3.5 - 5.1 mmol/L   Chloride 102 101 - 111 mmol/L   CO2 24 22 - 32 mmol/L   Glucose, Bld 141 (H) 65 - 99 mg/dL   BUN 23 (H) 6 - 20 mg/dL   Creatinine, Ser 0.83 0.44 - 1.00 mg/dL   Calcium 8.1 (L) 8.9 - 10.3 mg/dL   GFR calc non Af Amer >60 >60 mL/min   GFR calc Af Amer >60 >60 mL/min    Comment: (NOTE) The eGFR has been calculated using the CKD EPI equation. This calculation has not been validated in all clinical situations. eGFR's persistently <60 mL/min signify possible Chronic Kidney Disease.    Anion gap 7 5 - 15    Dg Chest 1 View  09/08/2014   CLINICAL DATA:  Dysphagia, facial swelling, nonproductive cough, right-sided chest pain.  EXAM:  CHEST  1 VIEW  COMPARISON:  09/05/2014  FINDINGS: The heart size and mediastinal contours are within normal limits. There is no evidence of focal airspace consolidation, pleural effusion or pneumothorax. There is mild atelectasis of the right lung base, possibly from splinting. The visualized skeletal structures are unremarkable. No displaced rib fractures are noted.  IMPRESSION: Mild right lower lobe atelectasis, otherwise no evidence of acute cardiopulmonary process.   Electronically Signed   By: Fidela Salisbury M.D.   On: 09/08/2014 17:30   Dg Swallowing Func-speech Pathology  09/08/2014    Objective Swallowing Evaluation:    Patient Details  Name: Megan Sims MRN: [id number] Date of Birth: 1955/06/12  Today's Date: 09/08/2014 Time: SLP Start Time (ACUTE ONLY): 1505-SLP Stop Time (ACUTE ONLY): 1534 SLP Time Calculation (min) (ACUTE ONLY): 29 min  Past Medical History:  Past Medical History  Diagnosis Date  . Migraine   . Lung abnormality   . Hypertension   . Pleurisy   . Cerebral aneurysm 2 brain surgeries 96 or 97  . Paget disease of bone   . Diarrhea    Past Surgical History:  Past Surgical History  Procedure Laterality Date  . Chest tube insertion    . Tubal ligation    . Cerebral aneurysm repair  38 or 54   HPI:  Other Pertinent Information: Megan Sims is a 59 y.o. female with past  medical history of HTN, cerebral aneurysm, Paget disease of the bone,  Pleurisy, and lung abnormality. Pt initially presented  3-4 weeks ago  after developing burning scalp and sores shortly after getting her hair  done. Went to [employer] and then came here as she was not any better.  States she felt better for the first 2 days after coming to ED on 08/21/14.  Since that time has developed increasing fatigue, weakness, difficulty  swallowing food, and neck and shoulder pain. Skin whelps never fully  resolved but improved on steroids. Significant worsening over past 2 days.  Last dose of steroids (60m) 2 days ago. .[place] Scrapelike  she has "pepper on  my tongue." ST consulted for bedside swallow examination. Chest x ray  negative for acute abnormalities. CT of Neck significant for Nonspecific  generalized pharyngeal mucosal swelling and  No Data Recorded  Assessment / Plan / Recommendation CHL IP CLINICAL IMPRESSIONS 09/08/2014  Therapy Diagnosis Moderate to Severe pharyngeal phase dysphagia  Clinical Impression Pt presents with moderate to severe pharyngeal  dysphagia with acute onset of unknown etiology. Reduced epiglottic  inversion and reduced  base of tongue retraction yielded limited propulsion  of bolus throughout pharynx and resulted in moderate residuals in both  valleculae and pyriform sinuses. As study progressed these residuals mixed  with further PO trials and spilled into laryngeal vestibule to eventually  become aspirated. Pt with aspiration of thin, nectar thick, and honey  thick consistencies. Although each incident was sensed, timing and  coordination of cough and throat clear was ineffective to clear aspirates  once they passed below true vocal cords. Aspirate amount noted to be less  as consistency thickened. Swallow strategies attempted including chin tuck  and supraglottic swallow. These reduced aspirate amounts but did not  completely protect airway. Consulted with MD regarding nutritional needs  and pts very high risk of aspiration. Recommend dysphagia 1 and honey  thick liquids with strict aspiration precautions. Medicines crushed with  puree. Recommend ENT consult to further evaluate pharyngeal phase of  swallowing. ST to follow up and closely monitor for further training of  safe swallow strategies       CHL IP TREATMENT RECOMMENDATION 09/08/2014  Treatment Recommendations Therapy as outlined in treatment plan below     CHL IP DIET RECOMMENDATION 09/08/2014  SLP Diet Recommendations Dysphagia 1 (Puree);Honey  Liquid Administration via (None)  Medication Administration Crushed with puree  Compensations Small  sips/bites;Effortful swallow;Hard cough after  swallow;Clear throat intermittently;Follow solids with liquid;Chin tuck  Postural Changes and/or Swallow Maneuvers (None)     CHL IP OTHER RECOMMENDATIONS 09/08/2014  Recommended Consults Consider ENT evaluation  Oral Care Recommendations Oral care BID  Other Recommendations Order thickener from pharmacy     No flowsheet data found.   CHL IP FREQUENCY AND DURATION 09/08/2014  Speech Therapy Frequency (ACUTE ONLY) min 2x/week  Treatment Duration 2 weeks     Pertinent Vitals/Pain     SLP Swallow Goals No flowsheet data found.  No flowsheet data found.    CHL IP REASON FOR REFERRAL 09/08/2014  Reason for Referral Objectively evaluate swallowing function                 Arvil Chaco MA, CCC-SLP Acute Care Speech Language Pathologist    Levi Aland 09/08/2014, 4:22 PM     ROS Blood pressure 150/72, pulse 90, temperature 97.6 F (36.4 C), temperature source Oral, resp. rate 14, height _0  (1.549 m), weight 128 lb 3.2 oz (58.151 kg), SpO2 99 %. Physical ExamAlert. Swelling underwent both eyes. HR regular. Lungs are clear. No abdominal pain.    Skin warm and dry. Oral mucosa is moist.   . Sclera anicteric, conjunctivae is pink. Thyroid not enlarged. No cervical lymphadenopathy. Lungs clear. Heart regular rate and rhythm.  Abdomen is soft. Bowel sounds are positive. No hepatomegaly. No abdominal masses felt. No tenderness.  No edema to lower extremities. Dry appearing rash to feet which is new perpatient.  Assessment/Plan: Dysphagia. ? Etiology. Will discuss with Dr Laural Golden.   SETZER,TERRI W 09/09/2014, 7:46 AM   Patient was transferred to [facility] before she could be seen

## 2014-09-09 NOTE — Progress Notes (Signed)
Pt arrived from [facility], BP 200, admin hydralazine, will continue to monitor

## 2014-09-09 NOTE — Progress Notes (Signed)
Subjective: Her hash and face swelling is better, but continue to have difficulty to swallow. Modified barrium swallow was done. Patient remained high risk for swallowing. I have talked to [facility] hospitalist for transfer and patient is accept for transfer  Objective: Vital signs in last 24 hours: Temp:  [97.6 F (36.4 C)-98.5 F (36.9 C)] 97.6 F (36.4 C) (08/17 0617) Pulse Rate:  [90] 90 (08/16 1300) Resp:  [14-20] 14 (08/17 0617) BP: (150-193)/(67-95) 150/72 mmHg (08/17 0723) SpO2:  [99 %-100 %] 99 % (08/17 0617) Weight change:  Last BM Date: 09/06/14  Intake/Output from previous day: 08/16 0701 - 08/17 0700 In: 770 [P.O.:720; IV Piggyback:50] Out: 5 [Urine:5]  PHYSICAL EXAM General appearance: alert and no distress Resp: clear to auscultation bilaterally Cardio: S1, S2 normal GI: soft, non-tender; bowel sounds normal; no masses,  no organomegaly Extremities: extremities normal, atraumatic, no cyanosis or edema  Lab Results:  Results for orders placed or performed during the hospital encounter of 09/05/14 (from the past 48 hour(s))  CBC     Status: Abnormal   Collection Time: 09/08/14  6:31 AM  Result Value Ref Range   WBC 11.6 (H) 4.0 - 10.5 K/uL   RBC 4.43 3.87 - 5.11 MIL/uL   Hemoglobin 13.0 12.0 - 15.0 g/dL   HCT 39.4 36.0 - 46.0 %   MCV 88.9 78.0 - 100.0 fL   MCH 29.3 26.0 - 34.0 pg   MCHC 33.0 30.0 - 36.0 g/dL   RDW 16.3 (H) 11.5 - 15.5 %   Platelets 209 150 - 400 K/uL  Basic metabolic panel     Status: Abnormal   Collection Time: 09/08/14  6:31 AM  Result Value Ref Range   Sodium 133 (L) 135 - 145 mmol/L   Potassium 3.8 3.5 - 5.1 mmol/L   Chloride 102 101 - 111 mmol/L   CO2 24 22 - 32 mmol/L   Glucose, Bld 141 (H) 65 - 99 mg/dL   BUN 23 (H) 6 - 20 mg/dL   Creatinine, Ser 0.83 0.44 - 1.00 mg/dL   Calcium 8.1 (L) 8.9 - 10.3 mg/dL   GFR calc non Af Amer >60 >60 mL/min   GFR calc Af Amer >60 >60 mL/min    Comment: (NOTE) The eGFR has been calculated using  the CKD EPI equation. This calculation has not been validated in all clinical situations. eGFR's persistently <60 mL/min signify possible Chronic Kidney Disease.    Anion gap 7 5 - 15    ABGS No results for input(s): PHART, PO2ART, TCO2, HCO3 in the last 72 hours.  Invalid input(s): PCO2 CULTURES No results found for this or any previous visit (from the past 240 hour(s)). Studies/Results: Dg Chest 1 View  09/08/2014   CLINICAL DATA:  Dysphagia, facial swelling, nonproductive cough, right-sided chest pain.  EXAM: CHEST  1 VIEW  COMPARISON:  09/05/2014  FINDINGS: The heart size and mediastinal contours are within normal limits. There is no evidence of focal airspace consolidation, pleural effusion or pneumothorax. There is mild atelectasis of the right lung base, possibly from splinting. The visualized skeletal structures are unremarkable. No displaced rib fractures are noted.  IMPRESSION: Mild right lower lobe atelectasis, otherwise no evidence of acute cardiopulmonary process.   Electronically Signed   By: Fidela Salisbury M.D.   On: 09/08/2014 17:30   Dg Swallowing Func-speech Pathology  09/08/2014    Objective Swallowing Evaluation:    Patient Details  Name: Megan Sims MRN: [id number] Date of Birth: [birth year]  Today's Date: 09/08/2014 Time: SLP Start Time (ACUTE ONLY): 1505-SLP Stop Time (ACUTE ONLY): 1534 SLP Time Calculation (min) (ACUTE ONLY): 29 min  Past Medical History:  Past Medical History  Diagnosis Date  . Migraine   . Lung abnormality   . Hypertension   . Pleurisy   . Cerebral aneurysm 2 brain surgeries 96 or 97  . Paget disease of bone   . Diarrhea    Past Surgical History:  Past Surgical History  Procedure Laterality Date  . Chest tube insertion    . Tubal ligation    . Cerebral aneurysm repair  53 or 27   HPI:  Other Pertinent Information: Megan Sims is a 59 y.o. female with past  medical history of HTN, cerebral aneurysm, Paget disease of the bone,  Pleurisy, and lung  abnormality. Pt initially presented  3-4 weeks ago  after developing burning scalp and sores shortly after getting her hair  done. Went to [employer] and then came here as she was not any better.  States she felt better for the first 2 days after coming to ED on 08/21/14.  Since that time has developed increasing fatigue, weakness, difficulty  swallowing food, and neck and shoulder pain. Skin whelps never fully  resolved but improved on steroids. Significant worsening over past 2 days.  Last dose of steroids (17m) 2 days ago. .Megan Sims she has "pepper on  my tongue." ST consulted for bedside swallow examination. Chest x ray  negative for acute abnormalities. CT of Neck significant for Nonspecific  generalized pharyngeal mucosal swelling and  No Data Recorded  Assessment / Plan / Recommendation CHL IP CLINICAL IMPRESSIONS 09/08/2014  Therapy Diagnosis Moderate to Severe pharyngeal phase dysphagia  Clinical Impression Pt presents with moderate to severe pharyngeal  dysphagia with acute onset of unknown etiology. Reduced epiglottic  inversion and reduced base of tongue retraction yielded limited propulsion  of bolus throughout pharynx and resulted in moderate residuals in both  valleculae and pyriform sinuses. As study progressed these residuals mixed  with further PO trials and spilled into laryngeal vestibule to eventually  become aspirated. Pt with aspiration of thin, nectar thick, and honey  thick consistencies. Although each incident was sensed, timing and  coordination of cough and throat clear was ineffective to clear aspirates  once they passed below true vocal cords. Aspirate amount noted to be less  as consistency thickened. Swallow strategies attempted including chin tuck  and supraglottic swallow. These reduced aspirate amounts but did not  completely protect airway. Consulted with MD regarding nutritional needs  and pts very high risk of aspiration. Recommend dysphagia 1 and honey  thick liquids with  strict aspiration precautions. Medicines crushed with  puree. Recommend ENT consult to further evaluate pharyngeal phase of  swallowing. ST to follow up and closely monitor for further training of  safe swallow strategies       CHL IP TREATMENT RECOMMENDATION 09/08/2014  Treatment Recommendations Therapy as outlined in treatment plan below     CHL IP DIET RECOMMENDATION 09/08/2014  SLP Diet Recommendations Dysphagia 1 (Puree);Honey  Liquid Administration via (None)  Medication Administration Crushed with puree  Compensations Small sips/bites;Effortful swallow;Hard cough after  swallow;Clear throat intermittently;Follow solids with liquid;Chin tuck  Postural Changes and/or Swallow Maneuvers (None)     CHL IP OTHER RECOMMENDATIONS 09/08/2014  Recommended Consults Consider ENT evaluation  Oral Care Recommendations Oral care BID  Other Recommendations Order thickener from pharmacy     No flowsheet data found.  CHL IP FREQUENCY AND DURATION 09/08/2014  Speech Therapy Frequency (ACUTE ONLY) min 2x/week  Treatment Duration 2 weeks     Pertinent Vitals/Pain     SLP Swallow Goals No flowsheet data found.  No flowsheet data found.    CHL IP REASON FOR REFERRAL 09/08/2014  Reason for Referral Objectively evaluate swallowing function                 Arvil Chaco MA, CCC-SLP Acute Care Speech Language Pathologist    Levi Aland 09/08/2014, 4:22 PM     Medications: I have reviewed the patient's current medications.  Assesment:   Active Problems:   Dysphagia   Fatigue   Paget's bone disease   Essential hypertension   Allergic reaction   Renal insufficiency   Leukocytosis pharyngeal swelling   Plan:  Medications reviewed Will continue IV steroid Will transfer to [facility] hospital in [place]..  LOS: 3 days   Deshay Blumenfeld 09/09/2014, 11:30 AM

## 2014-09-09 NOTE — Progress Notes (Signed)
Speech Language Pathology Treatment: Dysphagia  Patient Details Name: Megan Sims MRN: [id number] DOB: Sep 08, 1955 Today's Date: 09/09/2014 Time: 2010-0712 SLP Time Calculation (min) (ACUTE ONLY): 27 min  Assessment / Plan / Recommendation Clinical Impression  Pt observed with honey thick liquids and puree consistencies. Compensatory swallow strategies reinforced including chin tuck, liquid administration via teaspoon only, upright positioning, and multiple swallows for each bolus. Noted delayed throat clearing and intermittent wet vocal quality. Most recent chest x ray 09-08-14 significant for right lower lobe atelectasis. No acute abnormalites or temps noted. RN reports pt transferring to [facility] this date. Recommend FEES objective swallow study for further evaluation of pharyngeal phase of swallow.   HPI Other Pertinent Information: MIKHAELA ZAUGG is a 59 y.o. female with past medical history of HTN, cerebral aneurysm, Paget disease of the bone, Pleurisy, and lung abnormality. Pt initially presented  3-4 weeks ago after developing burning scalp and sores shortly after getting her hair done. Went to [employer] and then came here as she was not any better. States she felt better for the first 2 days after coming to ED on 08/21/14. Since that time has developed increasing fatigue, weakness, difficulty swallowing food, and neck and shoulder pain. Skin whelps never fully resolved but improved on steroids. Significant worsening over past 2 days. Last dose of steroids ('20mg'$ ) 2 days ago. [name] like she has "pepper on my tongue." ST consulted for bedside swallow examination. Chest x ray negative for acute abnormalities. CT of Neck significant for Nonspecific generalized pharyngeal mucosal swelling and   Pertinent Vitals    SLP Plan  Continue with current plan of care    Recommendations Diet recommendations: Dysphagia 1 (puree);Honey-thick liquid Liquids provided via: Teaspoon Medication Administration:  Crushed with puree Supervision: Patient able to self feed;Full supervision/cueing for compensatory strategies Compensations: Small sips/bites;Effortful swallow;Hard cough after swallow;Clear throat intermittently;Follow solids with liquid;Chin tuck              Oral Care Recommendations: Oral care BID Plan: Continue with current plan of care    [place] MA, [place] Language Pathologist    Levi Aland 09/09/2014, 3:25 PM

## 2014-09-09 NOTE — Care Management Note (Signed)
Case Management Note  Patient Details  Name: Megan Sims MRN: [id number] Date of Birth: 04/15/1955  Subjective/Objective:                    Action/Plan:   Expected Discharge Date:                  Expected Discharge Plan:  Acute to Acute Transfer  In-House Referral:  NA  Discharge planning Services  CM Consult  Post Acute Care Choice:  NA Choice offered to:  NA  DME Arranged:    DME Agency:     HH Arranged:    [place] Agency:     Status of Service:  Completed, signed off  Medicare Important Message Given:    Date Medicare IM Given:    Medicare IM give by:    Date Additional Medicare IM Given:    Additional Medicare Important Message give by:     If discussed at [place] of Stay Meetings, dates discussed:    Additional Comments: Pt for transfer to [facility] today for further treatment. CM on receiving unit to follow for discharge planning needs. Christinia Gully Obert, RN 09/09/2014, 4:10 PM

## 2014-09-10 ENCOUNTER — Inpatient Hospital Stay (HOSPITAL_COMMUNITY): Payer: Medicaid Other

## 2014-09-10 ENCOUNTER — Encounter (HOSPITAL_COMMUNITY): Payer: Self-pay

## 2014-09-10 DIAGNOSIS — C4499 Other specified malignant neoplasm of skin, unspecified: Secondary | ICD-10-CM | POA: Diagnosis present

## 2014-09-10 DIAGNOSIS — C3492 Malignant neoplasm of unspecified part of left bronchus or lung: Secondary | ICD-10-CM

## 2014-09-10 DIAGNOSIS — C3491 Malignant neoplasm of unspecified part of right bronchus or lung: Secondary | ICD-10-CM | POA: Diagnosis present

## 2014-09-10 DIAGNOSIS — M889 Osteitis deformans of unspecified bone: Secondary | ICD-10-CM | POA: Diagnosis present

## 2014-09-10 LAB — CBC WITH DIFFERENTIAL/PLATELET
Basophils Absolute: 0 10*3/uL (ref 0.0–0.1)
Basophils Relative: 0 % (ref 0–1)
EOS ABS: 0 10*3/uL (ref 0.0–0.7)
EOS PCT: 0 % (ref 0–5)
HCT: 41.2 % (ref 36.0–46.0)
Hemoglobin: 14.1 g/dL (ref 12.0–15.0)
LYMPHS ABS: 0.8 10*3/uL (ref 0.7–4.0)
Lymphocytes Relative: 9 % — ABNORMAL LOW (ref 12–46)
MCH: 30.5 pg (ref 26.0–34.0)
MCHC: 34.2 g/dL (ref 30.0–36.0)
MCV: 89.2 fL (ref 78.0–100.0)
Monocytes Absolute: 0.9 10*3/uL (ref 0.1–1.0)
Monocytes Relative: 10 % (ref 3–12)
Neutro Abs: 7.5 10*3/uL (ref 1.7–7.7)
Neutrophils Relative %: 81 % — ABNORMAL HIGH (ref 43–77)
PLATELETS: 207 10*3/uL (ref 150–400)
RBC: 4.62 MIL/uL (ref 3.87–5.11)
RDW: 16.6 % — ABNORMAL HIGH (ref 11.5–15.5)
WBC: 9.3 10*3/uL (ref 4.0–10.5)

## 2014-09-10 LAB — SAVE SMEAR

## 2014-09-10 LAB — COMPREHENSIVE METABOLIC PANEL
ALK PHOS: 154 U/L — AB (ref 38–126)
ALT: 46 U/L (ref 14–54)
AST: 47 U/L — AB (ref 15–41)
Albumin: 3.2 g/dL — ABNORMAL LOW (ref 3.5–5.0)
Anion gap: 9 (ref 5–15)
BILIRUBIN TOTAL: 0.7 mg/dL (ref 0.3–1.2)
BUN: 16 mg/dL (ref 6–20)
CALCIUM: 8.9 mg/dL (ref 8.9–10.3)
CO2: 26 mmol/L (ref 22–32)
CREATININE: 0.86 mg/dL (ref 0.44–1.00)
Chloride: 102 mmol/L (ref 101–111)
GFR calc Af Amer: >60 mL/min (ref >60)
Glucose, Bld: 145 mg/dL — ABNORMAL HIGH (ref 65–99)
Potassium: 3.7 mmol/L (ref 3.5–5.1)
Sodium: 137 mmol/L (ref 135–145)
Total Protein: 5.9 g/dL — ABNORMAL LOW (ref 6.5–8.1)

## 2014-09-10 LAB — PATHOLOGIST SMEAR REVIEW

## 2014-09-10 LAB — MAGNESIUM: MAGNESIUM: 2.3 mg/dL (ref 1.7–2.4)

## 2014-09-10 LAB — RAPID HIV SCREEN (HIV 1/2 AB+AG)
HIV 1/2 Antibodies: NONREACTIVE
HIV-1 P24 Antigen - HIV24: NONREACTIVE

## 2014-09-10 MED ORDER — SODIUM BICARBONATE 8.4 % IV SOLN
Freq: Once | INTRAVENOUS | Status: AC
  Administered 2014-09-10: 11:00:00 via INTRAVENOUS
  Filled 2014-09-10: qty 500

## 2014-09-10 MED ORDER — METOPROLOL TARTRATE 25 MG PO TABS
25.0000 mg | ORAL_TABLET | Freq: Two times a day (BID) | ORAL | Status: DC
  Administered 2014-09-10 – 2014-09-12 (×4): 25 mg via ORAL
  Filled 2014-09-10 (×5): qty 1

## 2014-09-10 MED ORDER — IOHEXOL 300 MG/ML  SOLN
80.0000 mL | Freq: Once | INTRAMUSCULAR | Status: AC | PRN
  Administered 2014-09-10: 80 mL via INTRAVENOUS

## 2014-09-10 MED ORDER — STARCH (THICKENING) PO POWD
ORAL | Status: DC | PRN
  Filled 2014-09-10 (×2): qty 227

## 2014-09-10 MED ORDER — IOHEXOL 300 MG/ML  SOLN
25.0000 mL | INTRAMUSCULAR | Status: AC
  Administered 2014-09-10 (×2): 25 mL via ORAL

## 2014-09-10 MED ORDER — SODIUM BICARBONATE BOLUS VIA INFUSION
Freq: Once | INTRAVENOUS | Status: AC
  Administered 2014-09-10: 11:00:00 via INTRAVENOUS
  Filled 2014-09-10: qty 0

## 2014-09-10 NOTE — Progress Notes (Signed)
SLP Cancellation Note  Patient Details Name: Megan Sims MRN: [id number] DOB: [birth year]   Cancelled treatment:       Reason Eval/Treat Not Completed: Other (comment) (Patient NPO for CT of abdomen. Will f/u later today. )  Gabriel Rainwater Tensed, CCC-SLP [phone]  [name] 09/10/2014, 9:27 AM

## 2014-09-10 NOTE — Progress Notes (Signed)
[facility] TEAM 1 - Stepdown/ICU TEAM Progress Note  Megan Sims WJX:[id number] DOB: 1955/02/04 DOA: 09/05/2014 PCP: Rosita Fire, MD  Admit HPI / Brief Narrative: 59 y.o.BF PMHx; cerebral aneurysm S/P2 surgeries 1996/1997 for repair, HTN, lung abnormality, Paget's disease of bone. 3-4 weeks ago pt developed burning scalp and sores shortly after getting her hair done. Went to [facility] and then came here as she was not any better. States she felt better for the first 2 days after coming to ED on 08/21/14. Since that time has developed increasing fatigue, weakness, difficulty swallowing food, and neck and shoulder pain. Skin whelps never fully resolved but improved on steroids. Significant worsening over past 2 days. Last dose of steroids ('20mg'$ ) 2 days ago. [name] like she has "pepper on my tongue."   No fevers.   This "may" have occurred to a lesser degree at prior hair appts.   HPI/Subjective: 8/18 A/O 4, states all symptoms started ~one month ago, to include hair loss which she initially attributed to having her hair done at a beauty salon, rash on her face, neck, arms. Neck pain mostly along the right lateral aspect. Enlargement of thyroid gland especially on the right. States she was diagnosed with Paget's disease ~ 9 years ago when she saw Dr. after she slipped at work and hurt her back and hips.   Assessment/Plan: Neoplasm of unknown origin -CT chest pending; CT abdomen and pelvis pending -Ultrasound-guided biopsy requested core vs excisional biopsy right level 4 node located adjacent to the lower right IJ  Infectious -Unlikely however obtain HIV, RPR, Acute Hepatitis -Blood cultures pending  Paget's disease/Extramammary Paget's disease -Alkaline phosphatase elevated (only mild elevation)  -Obtain alkaline phosphatase isoenzymes -Patient having some mild right cervical pain, and hand pain. Patient is not on bisphosphonate as an outpatient. Will not start acutely as bone pain is  only minor. -Skin lesions most likely expression of underlying malignancy  HTN -Considering patient has had a large dye load today and several days ago Cr mildly elevated would not use AceI or ARB at this time. -Metoprolol 25 mg BID       Code Status: FULL Family Communication: no family present at time of exam Disposition Plan: Completion of cancer workup.    Consultants:    Procedure/Significant Events: 8/14 CT soft tissue neck with contrast;- Malignant appearing superior mediastinal and right thoracic inlet lymphadenopathy, that in the chest appears mildly progressed since July.  -Rt level 4 node located adjacent to the lower right IJ should be amenable to ultrasound-guided needle biopsy  - Nonspecific generalized pharyngeal mucosal swelling and retropharyngeal/prevertebral effusion.  -No CT changes of discitis osteomyelitis in the adjacent cervical spine, and no associated cervical lymphadenopathy. In light of #1, consider a paraneoplastic syndrome. Pharyngeal or prevertebral infection seems less likely.   Culture   Antibiotics:   DVT prophylaxis: Heparin/SCD   Devices   LINES / TUBES:      Continuous Infusions: . sodium chloride 100 mL/hr at 09/10/14 0524    Objective: VITAL SIGNS: Temp: 97.8 F (36.6 C) (08/18 0728) Temp Source: Oral (08/18 0728) BP: 193/166 mmHg (08/18 0800) Pulse Rate: 75 (08/18 0728) SPO2; FIO2:   Intake/Output Summary (Last 24 hours) at 09/10/14 2130 Last data filed at 09/10/14 0500  Gross per 24 hour  Intake 2503.33 ml  Output      1 ml  Net 2502.33 ml     Exam: General: A/O 4, NAD, No acute respiratory distress Eyes: Negative headache, eye pain, double  vision,negative scleral hemorrhage ENT: Negative Runny nose, negative ear pain, negative tinnitus, negative gingival bleeding, however has whitish plaques along posterior aspects bilateral upper gums/anterior tonsillar pillars (nonpainful) Neck:  Positive  bilateral cervical lymphadenopathy  Rt >> Lt , painful to palpation right cervical lymph nodes, erythematous dry skin around the neck superior portion of the breasts nonblanching scaly in some areas, nonpainful to palpation. Bilateral forearms with similar rash. Enlarged thyroid gland specifically right lobe nonpainful to palpation. Negative JVD Lungs: Clear to auscultation bilaterally without wheezes or crackles Cardiovascular: Regular rate and rhythm without murmur gallop or rub normal S1 and S2 Abdomen:negative abdominal pain, negative dysphagia, nondistended, positive soft, bowel sounds, no rebound, no ascites, no appreciable mass Extremities: No significant cyanosis, clubbing, or edema bilateral lower extremities Psychiatric:  Negative depression, negative anxiety, negative fatigue, negative mania  Neurologic:  Cranial nerves II through XII intact, tongue/uvula midline, all extremities muscle strength 5/5, sensation intact throughout, negative dysarthria, negative expressive aphasia, negative receptive aphasia.      Data Reviewed: Basic Metabolic Panel:  Recent Labs Lab 09/05/14 1805 09/05/14 1933 09/06/14 0612 09/08/14 0631  NA 138  --  133* 133*  K 4.4  --  4.6 3.8  CL 105  --  102 102  CO2 24  --  22 24  GLUCOSE 97  --  153* 141*  BUN 26*  --  26* 23*  CREATININE 1.25* 1.19* 0.99 0.83  CALCIUM 8.8*  --  8.6* 8.1*   Liver Function Tests:  Recent Labs Lab 09/05/14 1805 09/06/14 0612  AST 49* 43*  ALT 27 27  ALKPHOS 175* 173*  BILITOT 0.6 0.4  PROT 6.5 6.3*  ALBUMIN 3.2* 3.0*   No results for input(s): LIPASE, AMYLASE in the last 168 hours. No results for input(s): AMMONIA in the last 168 hours. CBC:  Recent Labs Lab 09/05/14 1805 09/05/14 1933 09/06/14 0612 09/08/14 0631  WBC 12.3* 12.4* 8.3 11.6*  NEUTROABS 9.5*  --   --   --   HGB 14.7 14.6 14.0 13.0  HCT 43.8 44.7 41.9 39.4  MCV 91.6 91.8 91.9 88.9  PLT 200 227 203 209   Cardiac Enzymes: No results  for input(s): CKTOTAL, CKMB, CKMBINDEX, TROPONINI in the last 168 hours. BNP (last 3 results) No results for input(s): BNP in the last 8760 hours.  ProBNP (last 3 results) No results for input(s): PROBNP in the last 8760 hours.  CBG:  Recent Labs Lab 09/09/14 2217  GLUCAP 136*    Recent Results (from the past 240 hour(s))  MRSA PCR Screening     Status: None   Collection Time: 09/09/14  6:46 PM  Result Value Ref Range Status   MRSA by PCR NEGATIVE NEGATIVE Final    Comment:        The GeneXpert MRSA Assay (FDA approved for NASAL specimens only), is one component of a comprehensive MRSA colonization surveillance program. It is not intended to diagnose MRSA infection nor to guide or monitor treatment for MRSA infections.      Studies:  Recent x-ray studies have been reviewed in detail by the Attending Physician  Scheduled Meds:  Scheduled Meds: . amLODipine  10 mg Oral Daily  . famotidine (PEPCID) IV  20 mg Intravenous Q12H  . heparin  5,000 Units Subcutaneous 3 times per day  . methylPREDNISolone (SOLU-MEDROL) injection  125 mg Intravenous Q6H  . nicotine  14 mg Transdermal Daily  . tobramycin  1 drop Both Eyes BID    Time  spent on care of this patient: 40 mins   WOODS, Geraldo Docker , MD  [facility] Office  [phone] Pager [phone]  On-Call/Text Page:      [email or web address]      password [email or web address]  If 7PM-7AM, please contact night-coverage [email or web address] Password [email or web address] 09/10/2014, 8:23 AM   LOS: 4 days   Care during the described time interval was provided by me .  I have reviewed this patient's available data, including medical history, events of note, physical examination, and all test results as part of my evaluation. I have personally reviewed and interpreted all radiology studies.   Dia Crawford, MD [phone] Pager

## 2014-09-10 NOTE — Progress Notes (Signed)
Speech Language Pathology Treatment: Dysphagia  Patient Details Name: Megan Sims MRN: [id number] DOB: [birth year] Today's Date: 09/10/2014 Time: 4656-8127 SLP Time Calculation (min) (ACUTE ONLY): 14 min  Assessment / Plan / Recommendation Clinical Impression  Treatment focused on tolerance of currently prescribed diet and use of compensatory strategies. Patient able to correctly utilize compensatory strategies to decrease aspiration risk with min verbal cueing. Intermittent cough noted, reported by patient to be volitional per MBS recommendations. Patient reports some improvement in overall swallowing function as compared to instrumental testing 8/17. Plan to f/u in am 8/19. If patient continues to demonstrate and report improvement in swallowing function, recommend proceeding with instrumental testing, possible FEES to achieve different view of edema and pharyngeal tissue.     HPI Other Pertinent Information: Megan Sims is a 59 y.o. female with past medical history of HTN, cerebral aneurysm, Paget disease of the bone, Pleurisy, and lung abnormality. Pt initially presented  3-4 weeks ago after developing burning scalp and sores shortly after getting her hair done. Went to [facility] and then came here as she was not any better. States she felt better for the first 2 days after coming to ED on 08/21/14. Since that time has developed increasing fatigue, weakness, difficulty swallowing food, and neck and shoulder pain. Skin whelps never fully resolved but improved on steroids. Significant worsening over past 2 days. Last dose of steroids ('20mg'$ ) 2 days ago. [name] like she has "pepper on my tongue." ST consulted for bedside swallow examination. Chest x ray negative for acute abnormalities. CT of Neck significant for Nonspecific generalized pharyngeal mucosal swelling and   Pertinent Vitals Pain Assessment: No/denies pain  SLP Plan  Continue with current plan of care    Recommendations Diet  recommendations: Dysphagia 1 (puree);Honey-thick liquid Liquids provided via: Cup;No straw Medication Administration: Crushed with puree Supervision: Patient able to self feed;Full supervision/cueing for compensatory strategies Compensations: Small sips/bites;Effortful swallow;Hard cough after swallow;Clear throat intermittently;Follow solids with liquid;Chin tuck Postural Changes and/or Swallow Maneuvers: Seated upright 90 degrees;Chin tuck              Oral Care Recommendations: Oral care BID Plan: Continue with current plan of care    [place], [place] [phone]   [place] 09/10/2014, 3:11 PM

## 2014-09-11 ENCOUNTER — Inpatient Hospital Stay (HOSPITAL_COMMUNITY): Payer: Medicaid Other

## 2014-09-11 DIAGNOSIS — Z5111 Encounter for antineoplastic chemotherapy: Secondary | ICD-10-CM

## 2014-09-11 DIAGNOSIS — T7840XD Allergy, unspecified, subsequent encounter: Secondary | ICD-10-CM

## 2014-09-11 DIAGNOSIS — R1310 Dysphagia, unspecified: Secondary | ICD-10-CM

## 2014-09-11 DIAGNOSIS — M889 Osteitis deformans of unspecified bone: Secondary | ICD-10-CM

## 2014-09-11 DIAGNOSIS — C801 Malignant (primary) neoplasm, unspecified: Secondary | ICD-10-CM

## 2014-09-11 LAB — BASIC METABOLIC PANEL
Anion gap: 7 (ref 5–15)
BUN: 14 mg/dL (ref 6–20)
CO2: 29 mmol/L (ref 22–32)
Calcium: 8.2 mg/dL — ABNORMAL LOW (ref 8.9–10.3)
Chloride: 98 mmol/L — ABNORMAL LOW (ref 101–111)
Creatinine, Ser: 0.78 mg/dL (ref 0.44–1.00)
GFR calc Af Amer: >60 mL/min (ref >60)
GFR calc non Af Amer: >60 mL/min (ref >60)
Glucose, Bld: 129 mg/dL — ABNORMAL HIGH (ref 65–99)
Potassium: 3.5 mmol/L (ref 3.5–5.1)
Sodium: 134 mmol/L — ABNORMAL LOW (ref 135–145)

## 2014-09-11 LAB — TSH: TSH: 0.211 u[IU]/mL — ABNORMAL LOW (ref 0.350–4.500)

## 2014-09-11 LAB — PROTIME-INR
INR: 0.98 (ref 0.00–1.49)
Prothrombin Time: 13.2 seconds (ref 11.6–15.2)

## 2014-09-11 LAB — APTT: APTT: 39 s — AB (ref 24–37)

## 2014-09-11 MED ORDER — METHYLPREDNISOLONE SODIUM SUCC 125 MG IJ SOLR
60.0000 mg | Freq: Two times a day (BID) | INTRAMUSCULAR | Status: DC
  Administered 2014-09-12: 60 mg via INTRAVENOUS
  Filled 2014-09-11: qty 2

## 2014-09-11 NOTE — Progress Notes (Signed)
  Echocardiogram 2D Echocardiogram has been performed.  Megan Sims 09/11/2014, 12:16 PM

## 2014-09-11 NOTE — Progress Notes (Signed)
Speech Language Pathology Treatment: Dysphagia  Patient Details Name: JAELINE WHOBREY MRN: [id number] DOB: Jan 18, 1956 Today's Date: 09/11/2014 Time: 8295-6213; 12:40-1250 SLP Time Calculation (min) (ACUTE ONLY): 17 min; 10 min  Assessment / Plan / Recommendation Clinical Impression  Pt verbalizes improved comfort and ability to swallow today.  Provided with alternative textures of solids, liquids.  She was able to consume soft solids with no c/o pain and no overt difficulty.  Thin liquids continue to elicit intermittent coughing and discomfort; nectar thick liquids were consumed with no overt s/s of aspiration.  Recommend advancing diet to dysphagia 3, nectar-thick liquids with continued precautions.  Diet can likely continue to be advanced without instrumental study given clinical and self-described improvements.  Will follow.    HPI Other Pertinent Information: LARCENIA HOLADAY is a 59 y.o. female with past medical history of HTN, cerebral aneurysm, Paget disease of the bone, Pleurisy, and lung abnormality. Pt initially presented  3-4 weeks ago after developing burning scalp and sores shortly after getting her hair done. Went to [employer] and then came here as she was not any better. States she felt better for the first 2 days after coming to ED on 08/21/14. Since that time has developed increasing fatigue, weakness, difficulty swallowing food, and neck and shoulder pain. Skin whelps never fully resolved but improved on steroids. Significant worsening over past 2 days. Last dose of steroids ('20mg'$ ) 2 days ago. [name] like she has "pepper on my tongue." ST consulted for bedside swallow examination. Chest x ray negative for acute abnormalities. CT of Neck significant for Nonspecific generalized pharyngeal mucosal swelling and   Pertinent Vitals    SLP Plan  Continue with current plan of care    Recommendations Diet recommendations: Dysphagia 3 (mechanical soft);Nectar-thick liquid Liquids provided via: Cup;No  straw Medication Administration: Crushed with puree Supervision: Patient able to self feed;Intermittent supervision to cue for compensatory strategies Compensations: Small sips/bites;Clear throat intermittently              Oral Care Recommendations: Oral care BID Plan: Continue with current plan of care   Almarie Kurdziel L. Tivis Ringer, [place] CCC/SLP Pager [phone]      Juan Quam [place] 09/11/2014, 2:29 PM

## 2014-09-11 NOTE — Progress Notes (Signed)
IR received request for core biopsy of supraclavicular lymph node. Dr. Vernard Gambles has reviewed the images and IR is unable to obtain core biopsy secondary to surrounding vascular supply and would only be able to offer FNA. He would recommend excisional biopsy if concern is lymphoma. This has been discussed today with Dr. Thereasa Solo, will cancel IR order. Please call [phone] with any questions.   Tsosie Billing PA-C Interventional Radiology  09/11/14  10:44 AM

## 2014-09-11 NOTE — Progress Notes (Signed)
[place] TEAM 1 - Stepdown/ICU TEAM Progress Note  Megan Sims OHY:[id number] DOB: January 05, 1956 DOA: 09/05/2014 PCP: Rosita Fire, MD  Admit HPI / Brief Narrative: 59 y.o.F Hx cerebral aneurysm S/P 2 surgeries 1996/1997 for repair, HTN, lung abnormality, and Paget's disease of bone who 3-4 weeks prior to this admit developed a burning pain of the scalp and sores which she initially attributed to having her hair done. She went to [facility] initially, was diagnosed w/ an allergic reaction and given steroids, but subsequently presented to [facility] ED 8/13 when her sx persisted.  She had also developed increasing fatigue, weakness, difficulty swallowing food, facial swelling, and neck and shoulder pain. Her scalp skin whelps never fully resolved but improved on steroids.  Last dose of steroids ('20mg'$ ) 2 days prior to admit.   HPI/Subjective: The patient states she feels much better today.  She denies any current scalp lesions.  She denies shortness of breath nausea vomiting or chest pain.  Assessment/Plan:  Right supraclavicular and mediastinal lymphadenopathy -Differential includes lymphoma, bronchogenic neoplasm, or head/neck primary neoplasm -CT imaging of chest and abdomen has not revealed other evidence of neoplasm -due to location radiology feels supraclavicular node is not amenable to needle biopsy - Gen Surg suggested ENT referral -The patient will be seen in the office of Dr. Erik Obey on Monday at 9:48 AM for evaluation and to plan an excisional biopsy  ?Allergic reaction - facial swelling, throat swelling, scalp lesions, pruritis  -unclear etiology - unclear if related to above -now essentially resolved  Paget's disease of the bone  -Alkaline phosphatase elevated (only mild elevation)  -Obtain alkaline phosphatase isoenzymes -Patient having some mild right cervical pain, and hand pain. Patient is not on bisphosphonate as an outpatient.  HTN -BP remains poorly controlled - adjust  tx if persists tomorrow   Code Status: FULL Family Communication: Spoke with patient and multiple family members at bedside  Disposition Plan: Stable for transfer to medical bed - possible discharge home in a.m.  Consultants:  Procedures: 8/14 CT soft tissue neck with contrast Malignant appearing superior mediastinal and right thoracic inlet lymphadenopathy, that in the chest appears mildly progressed since July.  -Rt level 4 node located adjacent to the lower right IJ should be amenable to ultrasound-guided needle biopsy  -Nonspecific generalized pharyngeal mucosal swelling and retropharyngeal/prevertebral effusion.  -No CT changes of discitis osteomyelitis in the adjacent cervical spine, and no associated cervical lymphadenopathy. In light of #1, consider a paraneoplastic syndrome. Pharyngeal or prevertebral infection seems less likely.  Antibiotics: None  DVT prophylaxis: lovenox  Objective: Blood pressure 171/79, pulse 82, temperature 97.8 F (36.6 C), temperature source Oral, resp. rate 16, height '5\' 1"'$  (1.549 m), weight 58.151 kg (128 lb 3.2 oz), SpO2 98 %.  Intake/Output Summary (Last 24 hours) at 09/11/14 1453 Last data filed at 09/11/14 1100  Gross per 24 hour  Intake   2520 ml  Output      0 ml  Net   2520 ml   Exam: General: No acute respiratory distress Lungs: Clear to auscultation bilaterally without wheezes or crackles Cardiovascular: Regular rate and rhythm without murmur gallop or rub normal S1 and S2 Abdomen: Nontender, nondistended, soft, bowel sounds positive, no rebound, no ascites, no appreciable mass Extremities: No significant cyanosis, clubbing, or edema bilateral lower extremities  Data Reviewed: Basic Metabolic Panel:  Recent Labs Lab 09/05/14 1805 09/05/14 1933 09/06/14 0612 09/08/14 0631 09/10/14 0945 09/11/14 0251  NA 138  --  133* 133* 137 134*  K 4.4  --  4.6 3.8 3.7 3.5  CL 105  --  102 102 102 98*  CO2 24  --  '22 24 26 29    '$ GLUCOSE 97  --  153* 141* 145* 129*  BUN 26*  --  26* 23* 16 14  CREATININE 1.25* 1.19* 0.99 0.83 0.86 0.78  CALCIUM 8.8*  --  8.6* 8.1* 8.9 8.2*  MG  --   --   --   --  2.3  --    Liver Function Tests:  Recent Labs Lab 09/05/14 1805 09/06/14 0612 09/10/14 0945  AST 49* 43* 47*  ALT 27 27 46  ALKPHOS 175* 173* 154*  BILITOT 0.6 0.4 0.7  PROT 6.5 6.3* 5.9*  ALBUMIN 3.2* 3.0* 3.2*   CBC:  Recent Labs Lab 09/05/14 1805 09/05/14 1933 09/06/14 0612 09/08/14 0631 09/10/14 0945  WBC 12.3* 12.4* 8.3 11.6* 9.3  NEUTROABS 9.5*  --   --   --  7.5  HGB 14.7 14.6 14.0 13.0 14.1  HCT 43.8 44.7 41.9 39.4 41.2  MCV 91.6 91.8 91.9 88.9 89.2  PLT 200 227 203 209 207   CBG:  Recent Labs Lab 09/09/14 2217  GLUCAP 136*    Recent Results (from the past 240 hour(s))  MRSA PCR Screening     Status: None   Collection Time: 09/09/14  6:46 PM  Result Value Ref Range Status   MRSA by PCR NEGATIVE NEGATIVE Final    Comment:        The GeneXpert MRSA Assay (FDA approved for NASAL specimens only), is one component of a comprehensive MRSA colonization surveillance program. It is not intended to diagnose MRSA infection nor to guide or monitor treatment for MRSA infections.   Culture, blood (routine x 2)     Status: None (Preliminary result)   Collection Time: 09/10/14  8:00 PM  Result Value Ref Range Status   Specimen Description BLOOD LEFT HAND  Final   Special Requests IN PEDIATRIC BOTTLE 3CC  Final   Culture NO GROWTH < 24 HOURS  Final   Report Status PENDING  Incomplete  Culture, blood (routine x 2)     Status: None (Preliminary result)   Collection Time: 09/10/14  8:45 PM  Result Value Ref Range Status   Specimen Description BLOOD LEFT ARM  Final   Special Requests BOTTLES DRAWN AEROBIC AND ANAEROBIC 5CC  Final   Culture NO GROWTH < 24 HOURS  Final   Report Status PENDING  Incomplete     Studies:  Recent x-ray studies have been reviewed in detail by the Attending  Physician  Scheduled Meds:  Scheduled Meds: . amLODipine  10 mg Oral Daily  . famotidine (PEPCID) IV  20 mg Intravenous Q12H  . methylPREDNISolone (SOLU-MEDROL) injection  125 mg Intravenous Q6H  . metoprolol tartrate  25 mg Oral BID  . nicotine  14 mg Transdermal Daily  . tobramycin  1 drop Both Eyes BID    Time spent on care of this patient: 35 mins  Cherene Altes, MD [facility] For Consults/Admissions - Flow Manager - [phone] Office  [phone]  Contact MD directly via text page:      [email or web address]      password [facility]   09/11/2014, 2:53 PM   LOS: 5 days

## 2014-09-11 NOTE — Progress Notes (Signed)
Patient arrived to 3E30. Patient is alert and oriented, denies any discomfort at this time.  Patient oriented to room, unit, staff. Call bell within patient's reach, will continue to monitor. Report given to night RN.

## 2014-09-12 LAB — COMPREHENSIVE METABOLIC PANEL
ALBUMIN: 3.1 g/dL — AB (ref 3.5–5.0)
ALT: 59 U/L — ABNORMAL HIGH (ref 14–54)
ANION GAP: 10 (ref 5–15)
AST: 41 U/L (ref 15–41)
Alkaline Phosphatase: 135 U/L — ABNORMAL HIGH (ref 38–126)
BUN: 21 mg/dL — AB (ref 6–20)
CHLORIDE: 97 mmol/L — AB (ref 101–111)
CO2: 28 mmol/L (ref 22–32)
Calcium: 8.6 mg/dL — ABNORMAL LOW (ref 8.9–10.3)
Creatinine, Ser: 0.88 mg/dL (ref 0.44–1.00)
GFR calc Af Amer: >60 mL/min (ref >60)
GFR calc non Af Amer: >60 mL/min (ref >60)
GLUCOSE: 136 mg/dL — AB (ref 65–99)
POTASSIUM: 3.8 mmol/L (ref 3.5–5.1)
SODIUM: 135 mmol/L (ref 135–145)
TOTAL PROTEIN: 5.8 g/dL — AB (ref 6.5–8.1)
Total Bilirubin: 0.7 mg/dL (ref 0.3–1.2)

## 2014-09-12 LAB — CBC
HEMATOCRIT: 40.1 % (ref 36.0–46.0)
HEMOGLOBIN: 13.5 g/dL (ref 12.0–15.0)
MCH: 29.7 pg (ref 26.0–34.0)
MCHC: 33.7 g/dL (ref 30.0–36.0)
MCV: 88.3 fL (ref 78.0–100.0)
PLATELETS: 236 10*3/uL (ref 150–400)
RBC: 4.54 MIL/uL (ref 3.87–5.11)
RDW: 16.5 % — AB (ref 11.5–15.5)
WBC: 8.3 10*3/uL (ref 4.0–10.5)

## 2014-09-12 LAB — RPR: RPR: NONREACTIVE

## 2014-09-12 LAB — T4, FREE: FREE T4: 0.64 ng/dL (ref 0.61–1.12)

## 2014-09-12 MED ORDER — CARVEDILOL 6.25 MG PO TABS: 6.2500 mg | ORAL_TABLET | Freq: Two times a day (BID) | ORAL | Status: DC

## 2014-09-12 MED ORDER — PREDNISONE 10 MG PO TABS: ORAL_TABLET | ORAL | Status: DC

## 2014-09-12 MED ORDER — PREDNISONE 50 MG PO TABS: 60.0000 mg | ORAL_TABLET | Freq: Two times a day (BID) | ORAL | Status: DC

## 2014-09-12 MED ORDER — NICOTINE 14 MG/24HR TD PT24: 14.0000 mg | MEDICATED_PATCH | Freq: Every day | TRANSDERMAL | Status: DC

## 2014-09-12 MED ORDER — AMLODIPINE BESYLATE 10 MG PO TABS: 10.0000 mg | ORAL_TABLET | Freq: Every day | ORAL | Status: DC

## 2014-09-12 MED ORDER — EPINEPHRINE 0.3 MG/0.3ML IJ SOAJ: 0.3000 mg | Freq: Once | INTRAMUSCULAR | Status: DC

## 2014-09-12 MED ORDER — STARCH (THICKENING) PO POWD: 1.0000 | ORAL | Status: DC | PRN

## 2014-09-12 NOTE — Discharge Summary (Signed)
DISCHARGE SUMMARY  Megan Sims  MR#: [id number]  DOB:[birth year]  Date of Admission: 09/05/2014 Date of Discharge: 09/12/2014  Attending Physician:MCCLUNG,JEFFREY T  Patient's KDX:IPJAS,NKNLZJQ, MD  Consults:  none  Disposition: D/C home   Follow-up Appts:     Follow-up Information    Follow up with Jodi Marble, MD On 7/34/1937.   Specialty:  Otolaryngology   Why:  9:20 AM    Contact information:   [address] [place]  [address] [phone]       Follow up with Day    . Schedule an appointment as soon as possible for a visit in 1 week.   Contact information:   [place] [id number] [phone]     Tests Needing Follow-up: -recheck of BP is suggested -recheck of dysphagia is suggested - if pt able to drink water w/o coughing/gagging could likely be returned to regular diet   Instructions to patient: You should consume a Dysphagia 3 diet w/ nectar thick liquids for now.  After 3-5 days, if you feel your swallowing has improved, you may test yourself with a few sips of pure water.  If you are able to swallow the water without coughing or feeling that you are choking, you may resume your regular diet with thin liquids.  If you have trouble w/ coughing, choking, or feeling that things will not go down when you swallow during a water test, you should stick with a Dysphagia 3 diet w/ nectar liquids and follow up with your doctor as suggested above.   Discharge Diagnoses: Right supraclavicular and mediastinal lymphadenopathy ?Allergic reaction - facial swelling, throat swelling, scalp lesions, pruritis  Paget's disease of the bone  HTN   Initial presentation: 59 y.o.F Hx cerebral aneurysm S/P 2 surgeries 1996/1997 for repair, HTN, lung abnormality, and Paget's disease of bone who 3-4 weeks prior to this admit developed a burning pain of the scalp and sores which she initially  attributed to having her hair done. She went to [facility] initially, was diagnosed w/ an allergic reaction and given steroids, but subsequently presented to [facility] ED 8/13 when her sx persisted. She had also developed increasing fatigue, weakness, difficulty swallowing food, facial swelling, and neck and shoulder pain. Her scalp skin whelps never fully resolved but improved on steroids. Last dose of steroids ('20mg'$ ) 2 days prior to admit.   Hospital Course:  Right supraclavicular and mediastinal lymphadenopathy -Differential includes lymphoma, bronchogenic neoplasm, head/neck primary neoplasm, or potentially (but less likely) reactive  -CT imaging of chest and abdomen has not revealed other evidence of neoplasm -due to location radiology feels supraclavicular node is not amenable to needle biopsy - Gen Surg suggested ENT referral -The patient will be seen in the office of Dr. Erik Obey on Monday at 9:41 AM for evaluation and to plan an excisional biopsy -the importance of follow up and further diagnostic testing is impressed upon the pt and her family - she is in full agreement   ?Allergic reaction - facial swelling, throat swelling, scalp lesions, pruritis  -unclear etiology - unclear if related to above -now essentially resolved - rapidly taper steroids to off  -home w/ Rx for epipen - instructed on use and to only use in case of extreme emergency (and to call 911 when doing so)  Dysphagia -believed to be a temporary effect of her allergic type reaction/pharyngeal edema - slowly improving w/ improvement in swelling - d/c on mechanical soft  diet w/ nectar liquids - advised pt she may advance her own diet as able if she can tolerate sips of water w/o coughing or choking   Paget's disease of the bone  -Alkaline phosphatase elevated (only mild elevation)  -Patient having some mild right cervical pain, and hand pain. Patient is not on bisphosphonate as an outpatient.  To f/u with her PCP - no  evidence of severe flare at this time.  HTN -BP remains poorly controlled - likely related to steroid tx - rapidly taper steroids     Medication List    STOP taking these medications        diphenhydramine-acetaminophen 25-500 MG Tabs  Commonly known as:  TYLENOL PM      TAKE these medications        amLODipine 10 MG tablet  Commonly known as:  NORVASC  Take 1 tablet (10 mg total) by mouth daily.     carvedilol 6.25 MG tablet  Commonly known as:  COREG  Take 1 tablet (6.25 mg total) by mouth 2 (two) times daily with a meal.     diphenhydrAMINE 25 MG tablet  Commonly known as:  BENADRYL  Take 1-2 tablets (25-50 mg total) by mouth every 8 (eight) hours as needed for itching.     EPINEPHrine 0.3 mg/0.3 mL Soaj injection  Commonly known as:  EPI-PEN  Inject 0.3 mLs (0.3 mg total) into the muscle once.     food thickener Powd  Commonly known as:  THICK IT  Take 1 Container by mouth as needed (to thicken liquids prn).     nicotine 14 mg/24hr patch  Commonly known as:  NICODERM CQ - dosed in mg/24 hours  Place 1 patch (14 mg total) onto the skin daily.     predniSONE 10 MG tablet  Commonly known as:  DELTASONE  6 tablets 2x a day on 8/20 and 8/21, then 6 tablets once a day on 8/22 and 8/23, then 4 tablets a day on 8/24 and 8/25, then 2 tablets a day on 8/26 and 8/27, then 1 tablet a day on 8/28 and 8/29 then stop     tobramycin-dexamethasone ophthalmic solution  Commonly known as:  TOBRADEX  Place 1 drop into both eyes 2 (two) times daily.     triamcinolone cream 0.1 %  Commonly known as:  KENALOG  Apply 1 application topically 4 (four) times daily as needed.       Day of Discharge BP 192/86 mmHg  Pulse 64  Temp(Src) 98.2 F (36.8 C) (Oral)  Resp 16  Ht '5\' 1"'$  (1.549 m)  Wt 56.427 kg (124 lb 6.4 oz)  BMI 23.52 kg/m2  SpO2 100%  Physical Exam: General: No acute respiratory distress - neck supple - OC/OP clear  Lungs: Clear to auscultation bilaterally without  wheezes or crackles Cardiovascular: Regular rate and rhythm without murmur gallop or rub normal S1 and S2 Abdomen: Nontender, nondistended, soft, bowel sounds positive, no rebound, no ascites, no appreciable mass Extremities: No significant cyanosis, clubbing, or edema bilateral lower extremities  Basic Metabolic Panel:  Recent Labs Lab 09/06/14 0612 09/08/14 0631 09/10/14 0945 09/11/14 0251 09/12/14 0450  NA 133* 133* 137 134* 135  K 4.6 3.8 3.7 3.5 3.8  CL 102 102 102 98* 97*  CO2 '22 24 26 29 28  '$ GLUCOSE 153* 141* 145* 129* 136*  BUN 26* 23* 16 14 21*  CREATININE 0.99 0.83 0.86 0.78 0.88  CALCIUM 8.6* 8.1* 8.9 8.2* 8.6*  MG  --   --  2.3  --   --    Liver Function Tests:  Recent Labs Lab 09/05/14 1805 09/06/14 0612 09/10/14 0945 09/12/14 0450  AST 49* 43* 47* 41  ALT 27 27 46 59*  ALKPHOS 175* 173* 154* 135*  BILITOT 0.6 0.4 0.7 0.7  PROT 6.5 6.3* 5.9* 5.8*  ALBUMIN 3.2* 3.0* 3.2* 3.1*    Coags:  Recent Labs Lab 09/11/14 0930  INR 0.98   CBC:  Recent Labs Lab 09/05/14 1805 09/05/14 1933 09/06/14 0612 09/08/14 0631 09/10/14 0945 09/12/14 0450  WBC 12.3* 12.4* 8.3 11.6* 9.3 8.3  NEUTROABS 9.5*  --   --   --  7.5  --   HGB 14.7 14.6 14.0 13.0 14.1 13.5  HCT 43.8 44.7 41.9 39.4 41.2 40.1  MCV 91.6 91.8 91.9 88.9 89.2 88.3  PLT 200 227 203 209 207 236    CBG:  Recent Labs Lab 09/09/14 2217  GLUCAP 136*    Recent Results (from the past 240 hour(s))  MRSA PCR Screening     Status: None   Collection Time: 09/09/14  6:46 PM  Result Value Ref Range Status   MRSA by PCR NEGATIVE NEGATIVE Final    Comment:        The GeneXpert MRSA Assay (FDA approved for NASAL specimens only), is one component of a comprehensive MRSA colonization surveillance program. It is not intended to diagnose MRSA infection nor to guide or monitor treatment for MRSA infections.   Culture, blood (routine x 2)     Status: None (Preliminary result)   Collection Time:  09/10/14  8:00 PM  Result Value Ref Range Status   Specimen Description BLOOD LEFT HAND  Final   Special Requests IN PEDIATRIC BOTTLE 3CC  Final   Culture NO GROWTH < 24 HOURS  Final   Report Status PENDING  Incomplete  Culture, blood (routine x 2)     Status: None (Preliminary result)   Collection Time: 09/10/14  8:45 PM  Result Value Ref Range Status   Specimen Description BLOOD LEFT ARM  Final   Special Requests BOTTLES DRAWN AEROBIC AND ANAEROBIC 5CC  Final   Culture NO GROWTH < 24 HOURS  Final   Report Status PENDING  Incomplete      Time spent in discharge (includes decision making & examination of pt): >35 minutes  09/12/2014, 11:07 AM   Cherene Altes, MD [facility] Office  [phone] Pager [phone]  On-Call/Text Page:      [email or web address]      password [facility]

## 2014-09-12 NOTE — Discharge Instructions (Signed)
You should consume a Dysphagia 3 diet w/ nectar thick liquids for now.  After 3-5 days, if you feel your swallowing has improved, you may test yourself with a few sips of pure water.  If you are able to swallow the water without coughing or feeling that you are choking, you may resume your regular diet with thin liquids.  If you have trouble w/ coughing, choking, or feeling that things will not go down when you swallow during a water test, you should stick with a Dysphagia 3 diet w/ nectar liquids and follow up with your doctor as suggested above.   Dysphagia Level 3 Diet, Mechanically Advanced  The dysphagia level 3 diet includes foods that are soft, moist, and can be chopped into 1-inch chunks. This diet is helpful for people with mild swallowing difficulties. It reduces the risk of food getting caught in the windpipe, trachea, or lungs. WHAT DO I NEED TO KNOW ABOUT THIS DIET?  You may eat foods that are soft and moist.  If you were on the dysphagia level 1 or level 2 diets, you may eat any of the foods included on those lists.  Avoid foods that are dry, hard, sticky, chewy, coarse, and crunchy. Also avoid large cuts of food.  Take small bites. Each bite should contain 1 inch or less of food.  Thicken liquids if instructed by your health care provider. Follow your health care provider's instructions on how to do this and to what consistency.  See your dietitian or speech language pathologist regularly for help with your dietary changes. WHAT FOODS CAN I EAT? Grains Moist breads without nuts or seeds. Biscuits, muffins, pancakes, and waffles well-moistened with syrup, jelly, margarine, or butter. Smooth cereals with plenty of milk to moisten them. Moist bread stuffing. Moist rice. Vegetables All cooked, soft vegetables. Shredded lettuce. Tender fried potatoes. Fruits All canned and cooked fruits. Soft, peeled fresh fruits, such as peaches, nectarines, kiwis, cantaloupe, honeydew melon, and  watermelon without seeds. Soft berries, such as strawberries. Meat and Other Protein Sources Moist ground or finely diced or sliced meats. Solid, tender cuts of meat. Meatloaf. Hamburger with a bun. Sausage patty. Deli thin-sliced lunch meat. Chicken, egg, or tuna salad sandwich. Sloppy joe. Moist fish. Eggs prepared any way. Casseroles with small chunks of meats, ground meats, or tender meats. Dairy Cheese spreads without coarse large chunks. Shredded cheese. Cheese slices. Cottage cheese. Milk at the right texture. Smooth frappes. Yogurt without nuts or coconut. Ask your health care provider whether you can have frozen desserts (such as malts or milk shakes) and thin liquids. Sweets/Desserts Soft, smooth, moist desserts. Non-chewy, smooth candy. Jam. Jelly. Honey. Preserves. Ask your health care provider whether you can have frozen desserts. Fats and Oils Butter. Oils. Margarine. Mayonnaise. Gravy. Spreads. Other All seasonings and sweeteners. All sauces without large chunks. The items listed above may not be a complete list of recommended foods or beverages. Contact your dietitian for more options. WHAT FOODS ARE NOT RECOMMENDED? Grains Coarse or dry cereals. Dry breads. Toast. Crackers. Tough, crusty breads, such French bread and baguettes. Tough, crisp fried potatoes. Potato skins. Dry bread stuffing. Granola. Popcorn. Chips. Vegetables All raw vegetables except shredded lettuce. Cooked corn. Rubbery or stiff cooked vegetables. Stringy vegetables, such as celery. Fruits Hard fruits that are difficult to chew, such as apples or pears. Stringy, high-pulp fruits, such as pineapple, papaya, or mango. Fruits with tough skins, such as grapes. Coconut. All dried fruits. Fruit leather. Fruit roll-ups. Fruit snacks.  Meat and Other Protein Sources Dry or tough meats or poultry. Dry fish. Fish with bones. Peanut butter. All nuts and seeds. Dairy  Any with nuts, seeds, chocolate chips, dried fruit,  coconut, or pineapple. Sweets/Desserts Dry cakes. Chewy or dry cookies. Any with nuts, seeds, dry fruits, coconut, pineapple, or anything dry, sticky, or hard. Chewy caramel. Licorice. Taffy-type candies. Ask your health care provider whether you can have frozen desserts. Fats and Oils Any with chunks, nuts, seeds, or pineapple. Olives. Megan Sims. Other Soups with tough or large chunks of meats, poultry, or vegetables. Corn or clam chowder. The items listed above may not be a complete list of foods and beverages to avoid. Contact your dietitian for more information. Document Released: 01/09/2005 Document Revised: 09/11/2012 Document Reviewed: 12/23/2012 [facility] Patient Information 2015 [place], [place]. This information is not intended to replace advice given to you by your health care provider. Make sure you discuss any questions you have with your health care provider.  Thickening Liquids for Dysphagia Diet  If you are on the dysphagia diet, you may need to thicken drinks, soups, foods that melt at room temperature, and other liquids before you drink or eat them. Thickening liquids makes them easier to swallow. It also reduces the risk of liquid traveling to your lungs. To make a thickened liquid you will need to add a commercial thickening product or a soft food to the liquid until it reaches the consistency it needs to be. Your health care provider or dietitian will explain to you the consistency you need to aim for. Liquid consistencies include:  Thin. Thin liquids include most drinks (such as water, milk, tea, soda, juice, carbonated drinks), as well as ice cream, sherbet, sorbet, ice pops, and broth-based soups.  Nectar-like. Nectar-like liquids include maple syrup and creamy soup.  Honey-like. Honey-like liquids are made to be runny but are thick like honey. They cannot be sipped through a straw.  Spoon-thick. Spoon-thick liquids are thick, like pudding. MY PLAN I should thicken my  liquids to a NECTAR consistency. DIET GUIDELINES  Thicken liquids to the consistency your health care provider recommends.  Follow your dietitian's or health care provider's recommendation on how to thicken your liquids.  See your dietitian or health care provider regularly for help with your dietary changes. HOW CAN I THICKEN MY LIQUIDS? Liquids can be thickened with a commercial food and beverage thickener or with a soft food. [employer] Thickeners A food and beverage thickener is a powder or gel that makes a food or beverage thicker. Thickeners are sold at pharmacies, medical supply stores, some grocery stores, and online. They can be added to both hot and cold liquids and do not change the taste of the liquid. Ask your health care provider or dietitian for a complete list of commercial thickeners. Each thickening product is different. Some need to be blended into a liquid with a blender while others can be stirred into a liquid with a fork or spoon. Follow the instructions on the product label. Soft Foods Some foods such as soups, casseroles, and gravies can be thickened with soft foods. Soft foods include:  Baby cereal.  Gravy powder.  Mashed potato.  Pureed baby food.  Instant potato flakes.  Powdered sauce mixes (such as cheese mixes).  Flour. To use one of these soft food items, stir or mix them into the thin liquid until it reaches the desired thickness. Start with a small amount and adjust soft food and liquid as necessary.  Note: Flour works best with warm liquids, such as broth. To thicken a liquid with flour, make a paste out of flour and water. Cook or warm your liquid and add the paste to it. Stir until the mixture thickens. WHAT ARE SOME TIPS TO MAKE THICKENING LIQUIDS EASIER?  Take thickeners with you when eating out or traveling.  If a liquid gets too thick, add more of the thinner liquid until the desired consistency is reached.  Consider  purchasing pre-made thickened drinks.  Consider using a thickening product to make your own frozen desserts. Document Released: 07/11/2011 Document Revised: 01/14/2013 Document Reviewed: 12/23/2012 [facility] Patient Information 2015 [place], [place]. This information is not intended to replace advice given to you by your health care provider. Make sure you discuss any questions you have with your health care provider.     Allergies  Allergies may happen from anything your body is sensitive to. This may be food, medicines, pollens, chemicals, and nearly anything around you in everyday life that produces allergens. An allergen is anything that causes an allergy producing substance. Heredity is often a factor in causing these problems. This means you may have some of the same allergies as your parents. Food allergies happen in all age groups. Food allergies are some of the most severe and life threatening. Some common food allergies are cow's milk, seafood, eggs, nuts, wheat, and soybeans. SYMPTOMS   Swelling around the mouth.  An itchy red rash or hives.  Vomiting or diarrhea.  Difficulty breathing. SEVERE ALLERGIC REACTIONS ARE LIFE-THREATENING. This reaction is called anaphylaxis. It can cause the mouth and throat to swell and cause difficulty with breathing and swallowing. In severe reactions only a trace amount of food (for example, peanut oil in a salad) may cause death within seconds. Seasonal allergies occur in all age groups. These are seasonal because they usually occur during the same season every year. They may be a reaction to molds, grass pollens, or tree pollens. Other causes of problems are house dust mite allergens, pet dander, and mold spores. The symptoms often consist of nasal congestion, a runny itchy nose associated with sneezing, and tearing itchy eyes. There is often an associated itching of the mouth and ears. The problems happen when you come in contact with pollens and  other allergens. Allergens are the particles in the air that the body reacts to with an allergic reaction. This causes you to release allergic antibodies. Through a chain of events, these eventually cause you to release histamine into the blood stream. Although it is meant to be protective to the body, it is this release that causes your discomfort. This is why you were given anti-histamines to feel better. If you are unable to pinpoint the offending allergen, it may be determined by skin or blood testing. Allergies cannot be cured but can be controlled with medicine. Hay fever is a collection of all or some of the seasonal allergy problems. It may often be treated with simple over-the-counter medicine such as diphenhydramine. Take medicine as directed. Do not drink alcohol or drive while taking this medicine. Check with your caregiver or package insert for child dosages. If these medicines are not effective, there are many new medicines your caregiver can prescribe. Stronger medicine such as nasal spray, eye drops, and corticosteroids may be used if the first things you try do not work well. Other treatments such as immunotherapy or desensitizing injections can be used if all else fails. Follow up with your caregiver if problems  continue. These seasonal allergies are usually not life threatening. They are generally more of a nuisance that can often be handled using medicine. HOME CARE INSTRUCTIONS   If unsure what causes a reaction, keep a diary of foods eaten and symptoms that follow. Avoid foods that cause reactions.  If hives or rash are present:  Take medicine as directed.  You may use an over-the-counter antihistamine (diphenhydramine) for hives and itching as needed.  Apply cold compresses (cloths) to the skin or take baths in cool water. Avoid hot baths or showers. Heat will make a rash and itching worse.  If you are severely allergic:  Following a treatment for a severe reaction,  hospitalization is often required for closer follow-up.  Wear a medic-alert bracelet or necklace stating the allergy.  You and your family must learn how to give adrenaline or use an anaphylaxis kit.  If you have had a severe reaction, always carry your anaphylaxis kit or EpiPen with you. Use this medicine as directed by your caregiver if a severe reaction is occurring. Failure to do so could have a fatal outcome. SEEK MEDICAL CARE IF:  You suspect a food allergy. Symptoms generally happen within 30 minutes of eating a food.  Your symptoms have not gone away within 2 days or are getting worse.  You develop new symptoms.  You want to retest yourself or your child with a food or drink you think causes an allergic reaction. Never do this if an anaphylactic reaction to that food or drink has happened before. Only do this under the care of a caregiver. SEEK IMMEDIATE MEDICAL CARE IF:   You have difficulty breathing, are wheezing, or have a tight feeling in your chest or throat.  You have a swollen mouth, or you have hives, swelling, or itching all over your body.  You have had a severe reaction that has responded to your anaphylaxis kit or an EpiPen. These reactions may return when the medicine has worn off. These reactions should be considered life threatening. MAKE SURE YOU:   Understand these instructions.  Will watch your condition.  Will get help right away if you are not doing well or get worse. Document Released: 04/04/2002 Document Revised: 05/06/2012 Document Reviewed: 09/09/2007 [facility] Patient Information 2015 [place], [place]. This information is not intended to replace advice given to you by your health care provider. Make sure you discuss any questions you have with your health care provider.  Epinephrine injection (Auto-injector) What is this medicine? EPINEPHRINE (ep i NEF rin) is used for the emergency treatment of severe allergic reactions. You should keep this  medicine with you at all times. This medicine may be used for other purposes; ask your health care provider or pharmacist if you have questions. COMMON BRAND NAME(S): Adrenaclick, Auvi-Q, EpiPen, Twinject What should I tell my health care provider before I take this medicine? They need to know if you have any of the following conditions: -diabetes -heart disease -high blood pressure -lung or breathing disease, like asthma -Parkinson's disease -thyroid disease -an unusual or allergic reaction to epinephrine, sulfites, other medicines, foods, dyes, or preservatives -pregnant or trying to get pregnant -breast-feeding How should I use this medicine? This medicine is for injection into the outer thigh. Your doctor or health care professional will instruct you on the proper use of the device during an emergency. Read all directions carefully and make sure you understand them. Do not use more often than directed. Talk to your pediatrician regarding the use of  this medicine in children. Special care may be needed. This drug is commonly used in children. A special device is available for use in children. Overdosage: If you think you have taken too much of this medicine contact a poison control center or emergency room at once. NOTE: This medicine is only for you. Do not share this medicine with others. What if I miss a dose? This does not apply. You should only use this medicine for an allergic reaction. What may interact with this medicine? This medicine is only used during an emergency. Significant drug interactions are not likely during emergency use. This list may not describe all possible interactions. Give your health care provider a list of all the medicines, herbs, non-prescription drugs, or dietary supplements you use. Also tell them if you smoke, drink alcohol, or use illegal drugs. Some items may interact with your medicine. What should I watch for while using this medicine? Keep this  medicine ready for use in the case of a severe allergic reaction. Make sure that you have the phone number of your doctor or health care professional and local hospital ready. Remember to check the expiration date of your medicine regularly. You may need to have additional units of this medicine with you at work, school, or other places. Talk to your doctor or health care professional about your need for extra units. Some emergencies may require an additional dose. Check with your doctor or a health care professional before using an extra dose. After use, go to the nearest hospital or call 911. Avoid physical activity. Make sure the treating health care professional knows you have received an injection of this medicine. You will receive additional instructions on what to do during and after use of this medicine before a medical emergency occurs. What side effects may I notice from receiving this medicine? Side effects that you should report to your doctor or health care professional as soon as possible: -allergic reactions like skin rash, itching or hives, swelling of the face, lips, or tongue -breathing problems -chest pain -flushing -irregular or pounding heartbeat -numbness in fingers or toes -vomiting Side effects that usually do not require medical attention (report to your doctor or health care professional if they continue or are bothersome): -anxiety or nervousness -dizzy, drowsy -dry mouth -headache -increased sweating -nausea -tired, weak This list may not describe all possible side effects. Call your doctor for medical advice about side effects. You may report side effects to FDA at 1-800-FDA-1088. Where should I keep my medicine? Keep out of the reach of children. Store at room temperature between 15 and 30 degrees C (59 and 86 degrees F). Protect from light and heat. The solution should be clear in color. If the solution is discolored or contains particles it must be replaced.  Throw away any unused medicine after the expiration date. Ask your doctor or pharmacist about proper disposal of the injector if it is expired or has been used. Always replace your auto-injector before it expires. NOTE: This sheet is a summary. It may not cover all possible information. If you have questions about this medicine, talk to your doctor, pharmacist, or health care provider.  2015, [employer]. (2012-05-20 14:59:01)

## 2014-09-12 NOTE — Progress Notes (Signed)
Patient's discharged papers and its instructions printed,explained and given to the patient.Quienstions were answered satisfactorily at the time of discharged.

## 2014-09-13 LAB — HEPATITIS PANEL, ACUTE
HCV Ab: 0.7 s/co ratio (ref 0.0–0.9)
HEP A IGM: NEGATIVE
HEP B C IGM: NEGATIVE
HEP B S AG: NEGATIVE

## 2014-09-14 LAB — ALKALINE PHOSPHATASE ISOENZYMES
ALKALINE PHOSPHATASE (APISO): 157 U/L — AB (ref 33–130)
BONE %: 70 % — AB (ref 28–66)
Intestinal %: 0 % — ABNORMAL LOW (ref 1–24)
Liver %: 30 % (ref 25–69)
Macrohepatic isoenzymes: 0 %

## 2014-09-15 ENCOUNTER — Other Ambulatory Visit: Payer: Self-pay | Admitting: Otolaryngology

## 2014-09-15 DIAGNOSIS — R1310 Dysphagia, unspecified: Secondary | ICD-10-CM

## 2014-09-15 LAB — CULTURE, BLOOD (ROUTINE X 2)
CULTURE: NO GROWTH
CULTURE: NO GROWTH

## 2014-09-16 ENCOUNTER — Ambulatory Visit
Admission: RE | Admit: 2014-09-16 | Discharge: 2014-09-16 | Disposition: A | Discharge status: Home / Self Care | Payer: Medicaid Other | Source: Ambulatory Visit | Attending: Otolaryngology | Admitting: Otolaryngology

## 2014-09-16 ENCOUNTER — Other Ambulatory Visit: Payer: Self-pay | Admitting: Otolaryngology

## 2014-09-16 DIAGNOSIS — R1310 Dysphagia, unspecified: Secondary | ICD-10-CM

## 2014-09-17 ENCOUNTER — Encounter (HOSPITAL_COMMUNITY): Payer: Self-pay

## 2014-09-17 ENCOUNTER — Emergency Department (HOSPITAL_COMMUNITY)
Admission: EM | Admit: 2014-09-17 | Discharge: 2014-09-17 | Disposition: A | Discharge status: Home / Self Care | Payer: Medicaid Other | Attending: Emergency Medicine | Admitting: Emergency Medicine

## 2014-09-17 ENCOUNTER — Other Ambulatory Visit: Payer: Self-pay | Admitting: Otolaryngology

## 2014-09-17 DIAGNOSIS — I10 Essential (primary) hypertension: Secondary | ICD-10-CM | POA: Insufficient documentation

## 2014-09-17 DIAGNOSIS — R64 Cachexia: Secondary | ICD-10-CM | POA: Insufficient documentation

## 2014-09-17 DIAGNOSIS — Z792 Long term (current) use of antibiotics: Secondary | ICD-10-CM | POA: Insufficient documentation

## 2014-09-17 DIAGNOSIS — Z86018 Personal history of other benign neoplasm: Secondary | ICD-10-CM | POA: Insufficient documentation

## 2014-09-17 DIAGNOSIS — R070 Pain in throat: Secondary | ICD-10-CM

## 2014-09-17 DIAGNOSIS — Z87448 Personal history of other diseases of urinary system: Secondary | ICD-10-CM | POA: Diagnosis not present

## 2014-09-17 DIAGNOSIS — R1310 Dysphagia, unspecified: Secondary | ICD-10-CM | POA: Insufficient documentation

## 2014-09-17 DIAGNOSIS — Z79899 Other long term (current) drug therapy: Secondary | ICD-10-CM | POA: Diagnosis not present

## 2014-09-17 DIAGNOSIS — Z8739 Personal history of other diseases of the musculoskeletal system and connective tissue: Secondary | ICD-10-CM | POA: Diagnosis not present

## 2014-09-17 DIAGNOSIS — Z720 Tobacco use: Secondary | ICD-10-CM | POA: Diagnosis not present

## 2014-09-17 DIAGNOSIS — J392 Other diseases of pharynx: Secondary | ICD-10-CM | POA: Insufficient documentation

## 2014-09-17 DIAGNOSIS — G43909 Migraine, unspecified, not intractable, without status migrainosus: Secondary | ICD-10-CM | POA: Insufficient documentation

## 2014-09-17 DIAGNOSIS — L539 Erythematous condition, unspecified: Secondary | ICD-10-CM | POA: Diagnosis not present

## 2014-09-17 HISTORY — DX: Other specified postprocedural states: Z98.890

## 2014-09-17 MED ORDER — OXYCODONE HCL 5 MG/5ML PO SOLN: 5.0000 mg | Freq: Once | ORAL | Status: DC

## 2014-09-17 MED ORDER — DIPHENHYDRAMINE HCL 50 MG/ML IJ SOLN
25.0000 mg | Freq: Once | INTRAMUSCULAR | Status: AC
  Administered 2014-09-17: 25 mg via INTRAVENOUS
  Filled 2014-09-17: qty 1

## 2014-09-17 MED ORDER — OXYCODONE HCL 5 MG PO TABS: 2.5000 mg | ORAL_TABLET | ORAL | Status: DC | PRN

## 2014-09-17 MED ORDER — FAMOTIDINE IN NACL 20-0.9 MG/50ML-% IV SOLN
20.0000 mg | Freq: Once | INTRAVENOUS | Status: AC
  Administered 2014-09-17: 20 mg via INTRAVENOUS
  Filled 2014-09-17: qty 50

## 2014-09-17 MED ORDER — METHYLPREDNISOLONE SODIUM SUCC 125 MG IJ SOLR
125.0000 mg | Freq: Once | INTRAMUSCULAR | Status: AC
  Administered 2014-09-17: 125 mg via INTRAVENOUS
  Filled 2014-09-17: qty 2

## 2014-09-17 MED ORDER — OXYCODONE HCL 5 MG PO TABS
2.5000 mg | ORAL_TABLET | Freq: Once | ORAL | Status: AC
  Administered 2014-09-17: 2.5 mg via ORAL
  Filled 2014-09-17: qty 1

## 2014-09-17 NOTE — ED Notes (Signed)
Pt was seen this morning in [facility] for a biopsy of throat. One hour after Hydrocodone/Acet 7.5/325 mg she began having heavy secretions from mouth and nose

## 2014-09-17 NOTE — Discharge Instructions (Signed)

## 2014-09-17 NOTE — ED Provider Notes (Signed)
CSN: [id number]     Arrival date & time 09/17/14  1612 History   First MD Initiated Contact with Patient 09/17/14 1615     Chief Complaint  Patient presents with  . Allergic Reaction     (Consider location/radiation/quality/duration/timing/severity/associated sxs/prior Treatment) Patient is a 59 y.o. female presenting with general illness. The history is provided by the patient.  Illness Severity:  Moderate Onset quality:  Sudden Duration:  1 hour Timing:  Rare Progression:  Resolved Chronicity:  New Associated symptoms: no chest pain, no congestion, no fever, no headaches, no myalgias, no nausea, no rhinorrhea, no shortness of breath, no vomiting and no wheezing    59 yo F with a chief complaint of a full throat sensation as well as secretions from her eyes or nose. This happened right after she took hydrocodone elixir. Patient also had a lymph node biopsy in her neck performed today. Patient took her pain medicine when she got home and had the sensation. Called EMS they give her Benadryl en route. Patient now feels much better. At her baseline dysphagia. Patient has had a prolonged workup for possible cancer with significant with adenopathy as well as recurrent skin changes and concern for possible allergic phenomenon.  Past Medical History  Diagnosis Date  . Migraine   . Lung abnormality   . Hypertension   . Pleurisy   . Cerebral aneurysm 2 brain surgeries 96 or 97  . Paget disease of bone   . Diarrhea   . Renal insufficiency     Patient states " no kidney problems  . Malignant neoplasm of unknown origin   . S/P biopsy     of throat per patient.   Past Surgical History  Procedure Laterality Date  . Chest tube insertion    . Tubal ligation    . Cerebral aneurysm repair  96 or 17   Family History  Problem Relation Age of Onset  . Hypertension    . Diabetes    . Kidney disease     Social History  Substance Use Topics  . Smoking status: Current Every Day Smoker --  0.50 packs/day for 34 years    Types: Cigarettes  . Smokeless tobacco: None  . Alcohol Use: No   OB History    Gravida Para Term Preterm AB TAB SAB Ectopic Multiple Living            3     Review of Systems  Constitutional: Negative for fever and chills.  HENT: Positive for trouble swallowing. Negative for congestion and rhinorrhea.   Eyes: Negative for redness and visual disturbance.  Respiratory: Negative for shortness of breath and wheezing.   Cardiovascular: Negative for chest pain and palpitations.  Gastrointestinal: Negative for nausea and vomiting.  Genitourinary: Negative for dysuria and urgency.  Musculoskeletal: Negative for myalgias and arthralgias.  Skin: Negative for pallor and wound.  Neurological: Negative for dizziness and headaches.      Allergies  Hydrocodone-acetaminophen  Home Medications   Prior to Admission medications   Medication Sig Start Date End Date Taking? Authorizing Provider  amLODipine (NORVASC) 10 MG tablet Take 1 tablet (10 mg total) by mouth daily. 09/12/14   Cherene Altes, MD  carvedilol (COREG) 6.25 MG tablet Take 1 tablet (6.25 mg total) by mouth 2 (two) times daily with a meal. 09/12/14   Cherene Altes, MD  diphenhydrAMINE (BENADRYL) 25 MG tablet Take 1-2 tablets (25-50 mg total) by mouth every 8 (eight) hours as needed for itching.  Patient taking differently: Take 50 mg by mouth every 8 (eight) hours as needed for itching.  08/21/14   Merrily Pew, MD  EPINEPHrine 0.3 mg/0.3 mL IJ SOAJ injection Inject 0.3 mLs (0.3 mg total) into the muscle once. 09/12/14   Cherene Altes, MD  food thickener (THICK IT) POWD Take 1 Container by mouth as needed (to thicken liquids prn). 09/12/14   Cherene Altes, MD  nicotine (NICODERM CQ - DOSED IN MG/24 HOURS) 14 mg/24hr patch Place 1 patch (14 mg total) onto the skin daily. 09/12/14   Cherene Altes, MD  oxyCODONE (ROXICODONE) 5 MG immediate release tablet Take 0.5 tablets (2.5 mg total) by  mouth every 4 (four) hours as needed for severe pain. 09/17/14   Deno Etienne, DO  predniSONE (DELTASONE) 10 MG tablet 6 tablets 2x a day on 8/20 and 8/21, then 6 tablets once a day on 8/22 and 8/23, then 4 tablets a day on 8/24 and 8/25, then 2 tablets a day on 8/26 and 8/27, then 1 tablet a day on 8/28 and 8/29 then stop 09/12/14   Cherene Altes, MD  tobramycin-dexamethasone [facility]) ophthalmic solution Place 1 drop into both eyes 2 (two) times daily.    Historical Provider, MD  triamcinolone cream (KENALOG) 0.1 % Apply 1 application topically 4 (four) times daily as needed. 08/21/14   Merrily Pew, MD   BP 179/93 mmHg  Pulse 76  Temp(Src) 97.8 F (36.6 C) (Oral)  Resp 19  Ht '5\' 1"'$  (1.549 m)  Wt 121 lb (54.885 kg)  BMI 22.87 kg/m2  SpO2 97% Physical Exam  Constitutional: She is oriented to person, place, and time. She appears cachectic. No distress.  HENT:  Head: Normocephalic and atraumatic.  Handling secretions well not drooling no stridor not in sniffing position. Incisional scar to the right lower neck with no drainage mild tenderness, no noted swelling to the right or left neck  Eyes: EOM are normal. Pupils are equal, round, and reactive to light.  Neck: Normal range of motion. Neck supple.  Cardiovascular: Normal rate and regular rhythm.  Exam reveals no gallop and no friction rub.   No murmur heard. Pulmonary/Chest: Effort normal. She has no wheezes. She has no rales.  Abdominal: Soft. She exhibits no distension. There is no tenderness. There is no rebound.  Musculoskeletal: She exhibits no edema or tenderness.  Neurological: She is alert and oriented to person, place, and time.  Skin: Skin is warm and dry. She is not diaphoretic.  Bilateral perioral erythema. Erythema to the chest. Erythema to bilateral ears. Patient with mild hyperpigmented areas to bilateral upper and lower extremities including the palms and soles.  Psychiatric: She has a normal mood and affect. Her behavior  is normal.    ED Course  Procedures (including critical care time) Labs Review Labs Reviewed - No data to display  Imaging Review Dg Esophagus  09/16/2014   CLINICAL DATA:  Dysphagia  EXAM: ESOPHOGRAM/BARIUM SWALLOW  TECHNIQUE: Single contrast examination was performed using  thin barium.  FLUOROSCOPY TIME:  Radiation Exposure Index (as provided by the fluoroscopic device):  If the device does not provide the exposure index:  Fluoroscopy Time:  1 minutes 48 seconds  Number of Acquired Images:  COMPARISON:  None.  FINDINGS: The patient is on thick liquids due to history of aspiration during recent modified barium swallow examination.  In the upright position, with thin liquids, there was penetration and coating of the undersurface of the epiglottis. Barium extended  down to the cord.  There was aspiration with thin liquids with the patient in the prone position. Patient had mild coughing during penetration and aspiration. No additional barium was given due to aspiration.  The esophageal mucosa and motility are normal. No stricture or mass lesion is identified. Suboptimal evaluation of esophagus due to limited barium ingestion. No hiatal hernia identified.  IMPRESSION: Aspiration occurred with thin barium. Patient coughed. Limited barium administration due to aspiration.  No definite esophageal abnormality.   Electronically Signed   By: Franchot Gallo M.D.   On: 09/16/2014 10:32   I have personally reviewed and evaluated these images and lab results as part of my medical decision-making.   EKG Interpretation   Date/Time:  Thursday September 17 2014 16:13:41 EDT Ventricular Rate:  81 PR Interval:  165 QRS Duration: 77 QT Interval:  369 QTC Calculation: 428 R Axis:   56 Text Interpretation:  Sinus rhythm Anterior infarct, old Nonspecific T  abnormalities, lateral leads Confirmed by Adeli Frost MD, DANIEL [phone]) on  09/17/2014 4:58:51 PM      MDM   Final diagnoses:  Dysphagia  Throat discomfort     59 yo F well-appearing nontoxic no signs of airway compromise. No signs of swelling on the area of the biopsy. No stridor handling secretions. Will observe in the ED.  Patient observed in the ED for 2 hours with improvement of symptoms. No expanding size to the right side of her neck. Continues to tolerate by mouth without difficulty. Patient requesting pain medicine until form. We'll send her home with a short course of narcotic until she can see her PCP.  I have discussed the diagnosis/risks/treatment options with the patient and family and believe the pt to be eligible for discharge home to follow-up with PCP. We also discussed returning to the ED immediately if new or worsening sx occur. We discussed the sx which are most concerning (e.g., inability to swallow) that necessitate immediate return. Medications administered to the patient during their visit and any new prescriptions provided to the patient are listed below.  Medications given during this visit Medications  famotidine (PEPCID) IVPB 20 mg premix (0 mg Intravenous Stopped 09/17/14 1739)  diphenhydrAMINE (BENADRYL) injection 25 mg (25 mg Intravenous Given 09/17/14 1645)  methylPREDNISolone sodium succinate (SOLU-MEDROL) 125 mg/2 mL injection 125 mg (125 mg Intravenous Given 09/17/14 1645)  oxyCODONE (Oxy IR/ROXICODONE) immediate release tablet 2.5 mg (2.5 mg Oral Given 09/17/14 1811)    Discharge Medication List as of 09/17/2014  6:03 PM    START taking these medications   Details  oxyCODONE (ROXICODONE) 5 MG immediate release tablet Take 0.5 tablets (2.5 mg total) by mouth every 4 (four) hours as needed for severe pain., Starting 09/17/2014, Until Discontinued, Print         The patient appears reasonably screen and/or stabilized for discharge and I doubt any other medical condition or other [facility] requiring further screening, evaluation, or treatment in the ED at this time prior to discharge.    Deno Etienne, DO 09/17/14 1924

## 2014-09-17 NOTE — ED Notes (Signed)
Pt was given benadryl '25mg'$  in route by EMS secretions decreased. Pt reports difficulty swallowing and inability to eat for 5 days. Reports breathing has improved

## 2014-09-24 ENCOUNTER — Encounter: Payer: Self-pay | Admitting: Hematology and Oncology

## 2014-09-24 ENCOUNTER — Telehealth: Payer: Self-pay | Admitting: *Deleted

## 2014-09-24 ENCOUNTER — Ambulatory Visit (HOSPITAL_BASED_OUTPATIENT_CLINIC_OR_DEPARTMENT_OTHER): Payer: Medicaid Other | Admitting: Hematology and Oncology

## 2014-09-24 ENCOUNTER — Telehealth: Payer: Self-pay | Admitting: Hematology and Oncology

## 2014-09-24 ENCOUNTER — Encounter: Payer: Self-pay | Admitting: *Deleted

## 2014-09-24 VITALS — BP 174/86 | HR 98 | Temp 98.5°F | Resp 18 | Ht 61.0 in | Wt 115.7 lb

## 2014-09-24 DIAGNOSIS — C770 Secondary and unspecified malignant neoplasm of lymph nodes of head, face and neck: Secondary | ICD-10-CM | POA: Diagnosis not present

## 2014-09-24 DIAGNOSIS — R1310 Dysphagia, unspecified: Secondary | ICD-10-CM | POA: Diagnosis not present

## 2014-09-24 DIAGNOSIS — M542 Cervicalgia: Secondary | ICD-10-CM | POA: Diagnosis not present

## 2014-09-24 DIAGNOSIS — G8929 Other chronic pain: Secondary | ICD-10-CM | POA: Diagnosis not present

## 2014-09-24 DIAGNOSIS — B370 Candidal stomatitis: Secondary | ICD-10-CM

## 2014-09-24 DIAGNOSIS — L511 Stevens-Johnson syndrome: Secondary | ICD-10-CM

## 2014-09-24 DIAGNOSIS — F17200 Nicotine dependence, unspecified, uncomplicated: Secondary | ICD-10-CM | POA: Diagnosis not present

## 2014-09-24 DIAGNOSIS — C801 Malignant (primary) neoplasm, unspecified: Secondary | ICD-10-CM

## 2014-09-24 DIAGNOSIS — R634 Abnormal weight loss: Secondary | ICD-10-CM | POA: Insufficient documentation

## 2014-09-24 HISTORY — DX: Nicotine dependence, unspecified, uncomplicated: F17.200

## 2014-09-24 HISTORY — DX: Stevens-Johnson syndrome: L51.1

## 2014-09-24 HISTORY — DX: Other chronic pain: G89.29

## 2014-09-24 HISTORY — DX: Candidal stomatitis: B37.0

## 2014-09-24 MED ORDER — FLUCONAZOLE 100 MG PO TABS: 100.0000 mg | ORAL_TABLET | Freq: Every day | ORAL | Status: DC

## 2014-09-24 MED ORDER — FENTANYL 25 MCG/HR TD PT72: 25.0000 ug | MEDICATED_PATCH | TRANSDERMAL | Status: DC

## 2014-09-24 MED ORDER — NICOTINE 10 MG IN INHA: 1.0000 | RESPIRATORY_TRACT | Status: DC | PRN

## 2014-09-24 NOTE — Assessment & Plan Note (Signed)
I recommend urgent PET/CT scan for evaluation. I told the patient and her daughter that the likelihood is she had advanced stage head and neck cancer or lung cancer. She understood the importance of staging PET CT scan to find the source of the primary. I will see her next week for further assessment. I will get her case presented at the ENT tumor board

## 2014-09-24 NOTE — Assessment & Plan Note (Signed)
Her skin rashes are improving. 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 Recommend she stop coloring her hair. I would not recommend further treatment and recommended only topical emollient

## 2014-09-24 NOTE — Assessment & Plan Note (Signed)
I recommend frequent small meals and for her to pay attention when she eats. I will get speech and language therapist for formal assessment

## 2014-09-24 NOTE — Assessment & Plan Note (Signed)
She has acute on chronic neck pain. She also has shoulder pain which I suspect she may have symptoms related to polymyalgia which sometimes could be associated with malignancy. Due to difficulties with swallowing, I gave her prescription fentanyl patch. We discussed narcotic policy

## 2014-09-24 NOTE — Telephone Encounter (Signed)
[facility] faxed Prior authorization request for Fentanyl & Nicotrol.  Request to [facility] for review.

## 2014-09-24 NOTE — Assessment & Plan Note (Signed)
This is related to recent steroid-induced therapy. Give her prescription fluconazole

## 2014-09-24 NOTE — Assessment & Plan Note (Signed)
She has significant weight loss due to difficulties with swallowing and loss of appetite. I recommend frequent small meals and recommend dietitian evaluation

## 2014-09-24 NOTE — Progress Notes (Signed)
[facility] CONSULT NOTE  Patient Care Team: Rosita Fire, MD as PCP - General (Internal Medicine)  CHIEF COMPLAINTS/PURPOSE OF CONSULTATION:  Cancer of unknown primary  HISTORY OF PRESENTING ILLNESS:  Megan Sims 59 y.o. 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 She is a very pleasant patient, first started feeling unwell approximately 3 weeks ago. She has been coloring her hair over the past few months and with each episode, she developed more and more skin itchiness. Several weeks ago, she broke out with diffuse rash and was placed on steroid product because of clinical suspicions for skin reaction. She was hospitalized and that lead to further investigation and subsequent diagnosis of cancer. I review her chart extensively and collaborated with the patient. I summarized her oncology history as follows:   Malignant neoplasm of unknown origin   09/05/2014 - 09/12/2014 Hospital Admission The patient was admitted to the hospital for further management of possible skin reaction, dysphagia and supraclavicular lymphadenopathy   09/06/2014 Imaging CT scan of the neck showed malignant appearing superior mediastinal and right thoracic inlet lymphadenopathy   09/10/2014 Imaging CT scan of the abdomen and pelvis confirmed right supraclavicular and mediastinal lymphadenopathy, as described above but no other evidence of metastatic cancer    09/14/2014 Procedure Flexible endoscopy in the ENT office failed to reveal any primary in the head and neck region   09/16/2014 Imaging She failed barium swallow. 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 She complained of diffuse myalgias and arthralgias especially in the neck and shoulder region. She was prescribed some liquid pain medicine but complained of reaction to the pain medicine. She has lost over 20 pounds of weight. She denies recent cough. She is healing well from recent surgery. She denies change in bowel habits. Her skin rash and itchiness has improved. She complained of insomnia.  MEDICAL HISTORY:  Past Medical History  Diagnosis Date  . Migraine   . Lung abnormality   . Hypertension   . Pleurisy   . Cerebral aneurysm 2 brain surgeries 96 or 97  . Paget disease of bone   . Diarrhea   . Renal insufficiency     Patient states " no kidney problems  . Malignant neoplasm of unknown origin   . S/P biopsy     of throat per patient.  . Oral thrush 09/24/2014  . Nicotine dependence 09/24/2014  . Chronic neck pain 09/24/2014    SURGICAL HISTORY: Past Surgical History  Procedure Laterality Date  . Chest tube insertion    . Tubal ligation    . Cerebral aneurysm repair  96 or 97  . Colonoscopy      SOCIAL HISTORY: Social History   Social History  . Marital Status: Divorced    Spouse Name: N/A  . Number of Children: N/A  . Years of Education: N/A   Occupational History  . Not on file.   Social History Main Topics  . Smoking status: Current Every Day Smoker -- 0.50 packs/day for 34 years    Types: Cigarettes  . Smokeless tobacco: Never Used  . Alcohol Use: No  . Drug Use:  No  . Sexual Activity: Not on file   Other Topics Concern  . Not on file   Social History Narrative    FAMILY HISTORY: Family History  Problem Relation Age of Onset  . Hypertension    . Diabetes    . Kidney disease    . Cancer Mother     throat ca  . Cancer Maternal Grandmother     thyroid ca    ALLERGIES:  is allergic to hydrocodone-acetaminophen.  MEDICATIONS:  Current  Outpatient Prescriptions  Medication Sig Dispense Refill  . amLODipine (NORVASC) 10 MG tablet Take 1 tablet (10 mg total) by mouth daily. 30 tablet 0  . carvedilol (COREG) 6.25 MG tablet Take 1 tablet (6.25 mg total) by mouth 2 (two) times daily with a meal. 60 tablet 0  . diphenhydrAMINE (BENADRYL) 25 MG tablet Take 1-2 tablets (25-50 mg total) by mouth every 8 (eight) hours as needed for itching. (Patient taking differently: Take 50 mg by mouth every 8 (eight) hours as needed for itching. ) 45 tablet 0  . EPINEPHrine 0.3 mg/0.3 mL IJ SOAJ injection Inject 0.3 mLs (0.3 mg total) into the muscle once. 1 Device 0  . food thickener (THICK IT) POWD Take 1 Container by mouth as needed (to thicken liquids prn). 1 Can 0  . nicotine (NICODERM CQ - DOSED IN MG/24 HOURS) 14 mg/24hr patch Place 1 patch (14 mg total) onto the skin daily. 28 patch 0  . oxyCODONE (ROXICODONE) 5 MG immediate release tablet Take 0.5 tablets (2.5 mg total) by mouth every 4 (four) hours as needed for severe pain. 15 tablet 0  . tobramycin-dexamethasone (TOBRADEX) ophthalmic solution Place 1 drop into both eyes 2 (two) times daily.    Marland Kitchen triamcinolone cream (KENALOG) 0.1 % Apply 1 application topically 4 (four) times daily as needed. 30 g 1  . fentaNYL (DURAGESIC - DOSED MCG/HR) 25 MCG/HR patch Place 1 patch (25 mcg total) onto the skin every 3 (three) days. 5 patch 0  . fluconazole (DIFLUCAN) 100 MG tablet Take 1 tablet (100 mg total) by mouth daily. 7 tablet 0  . nicotine (NICOTROL) 10 MG inhaler Inhale 1 cartridge (1 continuous puffing total) into the lungs as needed for smoking cessation. 36 each 3   No current facility-administered medications for this visit.    REVIEW OF SYSTEMS:   Constitutional: Denies fevers, chills or abnormal night sweats Eyes: Denies blurriness of vision, double vision or watery eyes Ears, nose, mouth, throat, and face: Denies mucositis or sore throat Respiratory: Denies cough, dyspnea or  wheezes Cardiovascular: Denies palpitation, chest discomfort or lower extremity swelling Gastrointestinal:  Denies nausea, heartburn or change in bowel habits Lymphatics: Denies new lymphadenopathy or easy bruising Neurological:Denies numbness, tingling or new weaknesses Behavioral/Psych: Mood is stable, no new changes  All other systems were reviewed with the patient and are negative.  PHYSICAL EXAMINATION: ECOG PERFORMANCE STATUS: 2 - Symptomatic, <50% confined to bed  Filed Vitals:   09/24/14 1104  BP: 174/86  Pulse: 98  Temp: 98.5 F (36.9 C)  Resp: 18   Filed Weights   09/24/14 1104  Weight: 115 lb 11.2 oz (52.481 kg)    GENERAL:alert, no distress and comfortable. She looks thin SKIN: She has diffuse erythematous and scaly skin. No open sores. Scars are noted  EYES: normal, conjunctiva are pink and non-injected, sclera clear OROPHARYNX:no exudate, no erythema and lips, buccal mucosa, and tongue normal  NECK: supple, thyroid normal size, non-tender, without nodularity. Well-healed surgical scar  in the right soft the neck LYMPH:  no palpable lymphadenopathy in the cervical, axillary or inguinal LUNGS: clear to auscultation and percussion with normal breathing effort HEART: regular rate & rhythm and no murmurs and no lower extremity edema ABDOMEN:abdomen soft, non-tender and normal bowel sounds Musculoskeletal:no cyanosis of digits and no clubbing  PSYCH: alert & oriented x 3 with fluent speech NEURO: no focal motor/sensory deficits  LABORATORY DATA:  I have reviewed the data as listed Lab Results  Component Value Date   WBC 8.3 09/12/2014   HGB 13.5 09/12/2014   HCT 40.1 09/12/2014   MCV 88.3 09/12/2014   PLT 236 09/12/2014    Recent Labs  09/06/14 0612  09/10/14 0945 09/11/14 0251 09/12/14 0450  NA 133*  < > 137 134* 135  K 4.6  < > 3.7 3.5 3.8  CL 102  < > 102 98* 97*  CO2 22  < > '26 29 28  '$ GLUCOSE 153*  < > 145* 129* 136*  BUN 26*  < > 16 14 21*   CREATININE 0.99  < > 0.86 0.78 0.88  CALCIUM 8.6*  < > 8.9 8.2* 8.6*  GFRNONAA >60  < > >60 >60 >60  GFRAA >60  < > >60 >60 >60  PROT 6.3*  --  5.9*  --  5.8*  ALBUMIN 3.0*  --  3.2*  --  3.1*  AST 43*  --  47*  --  41  ALT 27  --  46  --  59*  ALKPHOS 173*  --  154*  --  135*  BILITOT 0.4  --  0.7  --  0.7  < > = values in this interval not displayed.  RADIOGRAPHIC STUDIES: I have personally reviewed the radiological images as listed and agreed with the findings in the report. Dg Neck Soft Tissue  09/05/2014   CLINICAL DATA:  Dysphagia.  Difficulty swallowing food.  EXAM: NECK SOFT TISSUES - 1+ VIEW  COMPARISON:  None.  FINDINGS: Mild prevertebral soft tissue swelling is present. Vascular calcifications are noted at the carotid bifurcations bilaterally. The airway is patent. No radiopaque foreign body is present. The lung apices are clear. Mediastinal adenopathy is again noted.  IMPRESSION: 1. Mild prevertebral soft tissue swelling raises concern for pharyngitis or edema. 2. Atherosclerotic calcifications at the carotid bifurcations. 3. Soft tissue in the superior mediastinum compatible with previously seen adenopathy.   Electronically Signed   By: San Morelle M.D.   On: 09/05/2014 18:56   Dg Chest 1 View  09/08/2014   CLINICAL DATA:  Dysphagia, facial swelling, nonproductive cough, right-sided chest pain.  EXAM: CHEST  1 VIEW  COMPARISON:  09/05/2014  FINDINGS: The heart size and mediastinal contours are within normal limits. There is no evidence of focal airspace consolidation, pleural effusion or pneumothorax. There is mild atelectasis of the right lung base, possibly from splinting. The visualized skeletal structures are unremarkable. No displaced rib fractures are noted.  IMPRESSION: Mild right lower lobe atelectasis, otherwise no evidence of acute cardiopulmonary process.   Electronically Signed   By: Fidela Salisbury M.D.   On: 09/08/2014 17:30   Dg Chest 2 View  09/05/2014    CLINICAL DATA:  Dysphagia for 4 days, difficulty swallowing fluid  EXAM: CHEST - 2 VIEW  COMPARISON:  08/14/2014  FINDINGS: Cardiac shadow is stable. The lungs are clear bilaterally. No acute bony abnormality is noted. Mild aortic calcifications are seen.  IMPRESSION: Aortic atherosclerotic disease.  No acute abnormality noted.  Electronically Signed   By: Inez Catalina M.D.   On: 09/05/2014 18:52   Ct Soft Tissue Neck W Contrast  09/06/2014   CLINICAL DATA:  59 year old female with scalp soft tissue swelling and source. Neck pain, fatigue, weakness, difficulty swallowing. Symptoms originally 3 weeks ago, with some improvement after steroids. Subsequent encounter.  EXAM: CT NECK WITH CONTRAST  TECHNIQUE: Multidetector CT imaging of the neck was performed using the standard protocol following the bolus administration of intravenous contrast.  CONTRAST:  60m OMNIPAQUE IOHEXOL 300 MG/ML  SOLN  COMPARISON:  Neck soft tissue radiographs 09/05/2014. Chest CT 08/14/2014.  FINDINGS: Pharynx and larynx: Pharyngeal motion artifact, requiring some of the neck images to be repeated.  There is moderate prevertebral/retropharyngeal soft tissue thickening which appears in part related to effusion. Fluid is most apparent in the midline along the anterior C2 and C3 vertebrae. There is overlying generalized pharyngeal mucosal thickening. There is no C2-C3 disc space loss or endplate destruction associated. No significant adenoid or tonsillar pillar enlargement. Parapharyngeal spaces are within normal limits.  Epiglottis and larynx are within normal limits, but there is persistent retropharyngeal/prevertebral effusion at the level of the larynx.  Salivary glands: Submandibular glands, sublingual space, and both parotid glands appear within normal limits.  Thyroid: Negative.  Lymph nodes: Bilateral level 1, level 2, and level 3 cervical lymph nodes are nonenlarged.  However, there is abnormal nodal enlargement at the right  thoracic inlet, level 4 (series 2, image 67 and coronal image 52). Abnormal nodes here measure up to 13 mm short axis. This appears mildly progressed since the July chest exam. See also upper chest findings below.  Vascular: Major vascular structures in the neck and at the skullbase appear to remain patent.  Limited intracranial: Sequelae of right frontotemporal craniotomy/ craniectomy partially re- demonstrated. 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 Chronic right anterior temporal lobe encephalomalacia.  Visualized orbits: Negative.  Mastoids and visualized paranasal sinuses: Chronic right lamina papyracea fracture. Visualized paranasal sinuses and mastoids are clear.  Skeleton: Postoperative changes to the right skull. No acute or suspicious osseous lesion identified.  Upper chest: Some pulmonary centrilobular emphysema is evident. Superior mediastinal lymphadenopathy has mildly progressed since July, with individual nodes measuring up to 2 cm short axis. No axillary lymphadenopathy. No upper lung nodule or mass identified (see also chest CT report 08/14/2014).  IMPRESSION: 1. 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 The right level 4 node located adjacent to the lower right IJ should be amenable to ultrasound-guided needle biopsy [facility] 2. Nonspecific generalized pharyngeal mucosal swelling and retropharyngeal/prevertebral effusion. No CT changes of discitis osteomyelitis in the adjacent cervical spine, and no associated cervical lymphadenopathy. In light of #1, consider a paraneoplastic syndrome. Pharyngeal or prevertebral infection seems less likely.   Electronically Signed   By: HGenevie AnnM.D.   On: 09/06/2014 12:07   Ct Chest W Contrast  09/10/2014   CLINICAL DATA:  Malignancy suspected on CT neck. Evaluate for neoplasm.   EXAM: CT CHEST, ABDOMEN, AND PELVIS WITH CONTRAST  TECHNIQUE: Multidetector CT imaging of the chest, abdomen and pelvis was performed following the standard protocol during bolus administration of intravenous contrast.  CONTRAST:  868mOMNIPAQUE IOHEXOL 300 MG/ML  SOLN  COMPARISON:  CT neck dated 09/06/2014. [facility] CT chest dated 08/14/2014.  FINDINGS: CT CHEST FINDINGS  Mediastinum/Nodes: The  heart is normal in size. No pericardial effusion.  Coronary atherosclerosis.  Atherosclerotic calcifications of the aortic arch.  Thoracic lymphadenopathy, including:  --11 mm short axis right supraclavicular node (series 21/image 1)  --17 mm short axis right superior mediastinal node (series 21/ image 14)  --10 mm short axis prevascular node (series 21/ image 20)  --17 mm short axis right lower paratracheal node (series 21/image 20)  Visualized thyroid is unremarkable.  Lungs/Pleura: Lungs are clear.  No suspicious pulmonary nodules.  No focal consolidation.  No pleural effusion or pneumothorax.  Musculoskeletal: Sclerosis with coarse trabeculations involving the T11 vertebral body, compatible with Paget's disease.  CT ABDOMEN PELVIS FINDINGS  Hepatobiliary: Liver is notable for focal fat/altered perfusion along the falciform ligament. No suspicious/enhancing hepatic lesions.  Gallbladder is unremarkable. No intrahepatic or extrahepatic ductal dilatation.  Pancreas: Within normal limits.  Spleen: Small mild residual splenic tissue in the left upper abdomen (series 21/ image 45).  Adrenals/Urinary Tract: Adrenal glands are within normal limits.  Kidneys are within normal limits.  No hydronephrosis.  Bladder is within normal limits.  Stomach/Bowel: Wall thickening along the posterior gastric cardia/body, likely related to underdistention.  No evidence of bowel obstruction.  No colonic wall thickening or mass is seen.  Vascular/Lymphatic: Atherosclerotic calcifications of the abdominal aorta and branch vessels.  No suspicious  abdominopelvic lymphadenopathy.  Reproductive: Uterus is unremarkable.  Bilateral ovaries are within normal limits.  Other: No abdominopelvic ascites.  Tiny fat containing right inguinal hernia (series 21/image 95).  Musculoskeletal: Sclerosis with coarsened trabeculation involving L1, L4, L5, the sacrum, and the bilateral pelvis, compatible with known Paget's disease.  IMPRESSION: Right supraclavicular and mediastinal lymphadenopathy, as described above.  Primary differential considerations include lymphoma, occult (nonvisualized) primary bronchogenic neoplasm, or less likely occult head/neck primary neoplasm. Consider sampling of the right supraclavicular node for further characterization.  Paget's disease involving the lower thoracolumbar spine and pelvis.   Electronically Signed   By: Julian Hy M.D.   On: 09/10/2014 14:46   Ct Abdomen Pelvis W Contrast  09/10/2014   CLINICAL DATA:  Malignancy suspected on CT neck. Evaluate for neoplasm.  EXAM: CT CHEST, ABDOMEN, AND PELVIS WITH CONTRAST  TECHNIQUE: Multidetector CT imaging of the chest, abdomen and pelvis was performed following the standard protocol during bolus administration of intravenous contrast.  CONTRAST:  14m OMNIPAQUE IOHEXOL 300 MG/ML  SOLN  COMPARISON:  CT neck dated 09/06/2014. [facility] CT chest dated 08/14/2014.  FINDINGS: CT CHEST FINDINGS  Mediastinum/Nodes: The heart is normal in size. No pericardial effusion.  Coronary atherosclerosis.  Atherosclerotic calcifications of the aortic arch.  Thoracic lymphadenopathy, including:  --11 mm short axis right supraclavicular node (series 21/image 1)  --17 mm short axis right superior mediastinal node (series 21/ image 14)  --10 mm short axis prevascular node (series 21/ image 20)  --17 mm short axis right lower paratracheal node (series 21/image 20)  Visualized thyroid is unremarkable.  Lungs/Pleura: Lungs are clear.  No suspicious pulmonary nodules.  No focal consolidation.  No pleural  effusion or pneumothorax.  Musculoskeletal: Sclerosis with coarse trabeculations involving the T11 vertebral body, compatible with Paget's disease.  CT ABDOMEN PELVIS FINDINGS  Hepatobiliary: Liver is notable for focal fat/altered perfusion along the falciform ligament. No suspicious/enhancing hepatic lesions.  Gallbladder is unremarkable. No intrahepatic or extrahepatic ductal dilatation.  Pancreas: Within normal limits.  Spleen: Small mild residual splenic tissue in the left upper abdomen (series 21/ image 45).  Adrenals/Urinary Tract: Adrenal glands are within normal limits.  Kidneys are within normal limits.  No hydronephrosis.  Bladder is within normal limits.  Stomach/Bowel: Wall thickening along the posterior gastric cardia/body, likely related to underdistention.  No evidence of bowel obstruction.  No colonic wall thickening or mass is seen.  Vascular/Lymphatic: Atherosclerotic calcifications of the abdominal aorta and branch vessels.  No suspicious abdominopelvic lymphadenopathy.  Reproductive: Uterus is unremarkable.  Bilateral ovaries are within normal limits.  Other: No abdominopelvic ascites.  Tiny fat containing right inguinal hernia (series 21/image 95).  Musculoskeletal: Sclerosis with coarsened trabeculation involving L1, L4, L5, the sacrum, and the bilateral pelvis, compatible with known Paget's disease.  IMPRESSION: Right supraclavicular and mediastinal lymphadenopathy, as described above.  Primary differential considerations include lymphoma, occult (nonvisualized) primary bronchogenic neoplasm, or less likely occult head/neck primary neoplasm. Consider sampling of the right supraclavicular node for further characterization.  Paget's disease involving the lower thoracolumbar spine and pelvis.   Electronically Signed   By: Julian Hy M.D.   On: 09/10/2014 14:46   Dg Esophagus  09/16/2014   CLINICAL DATA:  Dysphagia  EXAM: ESOPHOGRAM/BARIUM SWALLOW  TECHNIQUE: Single contrast examination  was performed using  thin barium.  FLUOROSCOPY TIME:  Radiation Exposure Index (as provided by the fluoroscopic device):  If the device does not provide the exposure index:  Fluoroscopy Time:  1 minutes 48 seconds  Number of Acquired Images:  COMPARISON:  None.  FINDINGS: The patient is on thick liquids due to history of aspiration during recent modified barium swallow examination.  In the upright position, with thin liquids, there was penetration and coating of the undersurface of the epiglottis. Barium extended down to the cord.  There was aspiration with thin liquids with the patient in the prone position. Patient had mild coughing during penetration and aspiration. No additional barium was given due to aspiration.  The esophageal mucosa and motility are normal. No stricture or mass lesion is identified. Suboptimal evaluation of esophagus due to limited barium ingestion. No hiatal hernia identified.  IMPRESSION: Aspiration occurred with thin barium. Patient coughed. Limited barium administration due to aspiration.  No definite esophageal abnormality.   Electronically Signed   By: Franchot Gallo M.D.   On: 09/16/2014 10:32   Dg Swallowing Func-speech Pathology  09/08/2014    Objective Swallowing Evaluation:    Patient Details  Name: Megan Sims MRN: [id number] Date of Birth: 08/04/1955  Today's Date: 09/08/2014 Time: SLP Start Time (ACUTE ONLY): 1505-SLP Stop Time (ACUTE ONLY): 1534 SLP Time Calculation (min) (ACUTE ONLY): 29 min  Past Medical History:  Past Medical History  Diagnosis Date  . Migraine   . Lung abnormality   . Hypertension   . Pleurisy   . Cerebral aneurysm 2 brain surgeries 96 or 97  . Paget disease of bone   . Diarrhea    Past Surgical History:  Past Surgical History  Procedure Laterality Date  . Chest tube insertion    . Tubal ligation    . Cerebral aneurysm repair  74 or 33   HPI:  Other Pertinent Information: DELYLA SANDEEN is a 59 y.o. female with past  medical history of HTN, cerebral aneurysm,  Paget disease of the bone,  Pleurisy, and lung abnormality. Pt initially presented  3-4 weeks ago  after developing burning scalp and sores shortly after getting her hair  done. Went to [employer] [employer] and then came here as she was not any better.  States she felt better for the first 2 days after coming to ED on 08/21/14.  Since that time has developed increasing fatigue, weakness, difficulty  swallowing food, and neck and shoulder pain. Skin whelps never fully  resolved but improved on steroids. Significant worsening over past 2 days.  Last dose of steroids ('20mg'$ ) 2 days ago. [name] like she has "pepper on  my tongue." ST consulted for bedside swallow examination. Chest x ray  negative for acute abnormalities. CT of Neck significant for Nonspecific  generalized pharyngeal mucosal swelling and  No Data Recorded  Assessment / Plan / Recommendation [facility] IP CLINICAL IMPRESSIONS 09/08/2014  Therapy Diagnosis Moderate to Severe pharyngeal phase dysphagia  Clinical Impression Pt presents with moderate to severe pharyngeal  dysphagia with acute onset of unknown etiology. Reduced epiglottic  inversion and reduced base of tongue retraction yielded limited propulsion  of bolus throughout pharynx and resulted in moderate residuals in both  valleculae and pyriform sinuses. As study progressed these residuals mixed  with further PO trials and spilled into laryngeal vestibule to eventually  become aspirated. Pt with aspiration of thin, nectar thick, and honey  thick consistencies. Although each incident was sensed, timing and  coordination of cough and throat clear was ineffective to clear aspirates  once they passed below true vocal cords. Aspirate amount noted to be less  as consistency thickened. Swallow strategies attempted including chin tuck  and supraglottic swallow. These reduced aspirate amounts but did not  completely protect airway. Consulted with MD regarding nutritional needs  and pts very high risk of aspiration. Recommend  dysphagia 1 and honey  thick liquids with strict aspiration precautions. Medicines crushed with  puree. Recommend ENT consult to further evaluate pharyngeal phase of  swallowing. ST to follow up and closely monitor for further training of  safe swallow strategies       [facility] IP TREATMENT RECOMMENDATION 09/08/2014  Treatment Recommendations Therapy as outlined in treatment plan below     [facility] IP DIET RECOMMENDATION 09/08/2014  SLP Diet Recommendations Dysphagia 1 (Puree);Honey  Liquid Administration via (None)  Medication Administration Crushed with puree  Compensations Small sips/bites;Effortful swallow;Hard cough after  swallow;Clear throat intermittently;Follow solids with liquid;Chin tuck  Postural Changes and/or Swallow Maneuvers (None)     [facility] IP OTHER RECOMMENDATIONS 09/08/2014  Recommended Consults Consider ENT evaluation  Oral Care Recommendations Oral care BID  Other Recommendations Order thickener from pharmacy     No flowsheet data found.   CHL IP FREQUENCY AND DURATION 09/08/2014  Speech Therapy Frequency (ACUTE ONLY) min 2x/week  Treatment Duration 2 weeks     Pertinent Vitals/Pain     SLP Swallow Goals No flowsheet data found.  No flowsheet data found.    CHL IP REASON FOR REFERRAL 09/08/2014  Reason for Referral Objectively evaluate swallowing function                 Arvil Chaco MA, CCC-SLP Acute Care Speech Language Pathologist    Levi Aland 09/08/2014, 4:22 PM     ASSESSMENT & PLAN:  Malignant neoplasm of unknown origin I recommend urgent PET/CT scan for evaluation. I told the patient and her daughter that the likelihood is she had advanced stage head and neck cancer or lung cancer. She understood the importance of staging PET CT scan to find the source of the primary. I will see her next week for further assessment. I will get her case presented at the ENT tumor board  Chronic neck pain She has acute on chronic neck pain. She also has shoulder pain which I suspect she may have  symptoms related to polymyalgia which sometimes could be associated with malignancy. Due to difficulties with swallowing, I gave her prescription fentanyl patch. We discussed narcotic policy  Dysphagia I recommend frequent small meals and for her to pay attention when she eats. I will get speech and language therapist for formal assessment  Nicotine dependence I spent some time counseling the patient the importance of tobacco cessation. she is currently attempting to quit on her own  I gave her patient education handout and encouraged her to sign up for smoking cessation class. I gave her prescription of nicotine inhaler  Oral thrush This is related to recent steroid-induced therapy. Give her prescription fluconazole  Excessive weight loss She has significant weight loss due to difficulties with swallowing and loss of appetite. 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 Recommend she stop coloring her hair. I would not recommend further treatment and recommended only topical emollient     All questions were answered. The patient knows to call the clinic with any problems, questions or concerns. I spent 55 minutes counseling the patient face to face. The total time spent in the appointment was 60 minutes and more than 50% was on counseling.     [facility], [place], MD 09/24/2014 12:53 PM

## 2014-09-24 NOTE — Telephone Encounter (Signed)
per pof to sch pt appt-gave pt copy of avs-sch referrals w/ barabara neff-called [facility] and pt is in [place] for [place]. they willcall and sch w/pt

## 2014-09-24 NOTE — Assessment & Plan Note (Signed)
I spent some time counseling the patient the importance of tobacco cessation. she is currently attempting to quit on her own  I gave her patient education handout and encouraged her to sign up for smoking cessation class. I gave her prescription of nicotine inhaler

## 2014-09-24 NOTE — Progress Notes (Signed)
I faxed prior auth req to [employer] for fentanyl

## 2014-09-25 ENCOUNTER — Encounter: Payer: Self-pay | Admitting: *Deleted

## 2014-09-25 ENCOUNTER — Telehealth: Payer: Self-pay | Admitting: *Deleted

## 2014-09-25 NOTE — Progress Notes (Addendum)
Entry error

## 2014-09-25 NOTE — Telephone Encounter (Signed)
  Oncology Nurse Navigator Documentation    Navigator Encounter Type: Telephone (09/25/14 1520) Patient Visit Type: Follow-up (09/25/14 1520)       Interventions: Coordination of Care (09/25/14 1520)       Called pt, confirmed her understanding of PET scheduled for 9/6 at [facility] for 8:30, arrival 8:00, NPO after midnight.  She verbalized understanding.  I called her dtr Katrina, confirmed same.  Gayleen Orem, RN, BSN, [place] at [place] [phone]         Time Spent with Patient: 15 (09/25/14 1520)

## 2014-09-25 NOTE — Progress Notes (Signed)
Oncology Nurse Navigator Documentation Referral date to RadOnc/MedOnc: 09/24/14 (09/24/14 1100) Navigator Encounter Type: Initial MedOnc (09/24/14 1100)    Met with patient during initial consult with Dr. Alvy Bimler. She was accompanied by her dtrs and son. Dtr Katrina joined her for consult. I introduced myself as her/their Navigator, explained my role as a member of the Care Team. Provided New Patient Information packet:  Contact information for physician, this navigator, other members of the Care Team  Advance Directive information ([place] blue pamphlet with LCSW insert)  Fall Prevention Patient Safety Plan  Appointment Guideline  ACS Referral Form  [facility] map with highlight of [place] They verbalized understanding that PET needed to clarify diagnosis, that it would be scheduled ASAP so her case can be presented at 9/7 Tumor Board. Assisted with post-consult appt scheduling, meeting with Financial Counseling. They verbalized understanding of information provided. I encouraged them to call with questions/concerns, they verbalized understanding.  Gayleen Orem, RN, BSN, [place] at [place] [phone]   Time Spent with Patient: 75 (09/24/14 1100)

## 2014-09-29 ENCOUNTER — Ambulatory Visit
Admission: RE | Admit: 2014-09-29 | Discharge: 2014-09-29 | Disposition: A | Discharge status: Home / Self Care | Payer: Medicaid Other | Source: Ambulatory Visit | Attending: Hematology and Oncology | Admitting: Hematology and Oncology

## 2014-09-29 ENCOUNTER — Encounter: Payer: Self-pay | Admitting: Hematology and Oncology

## 2014-09-29 DIAGNOSIS — Z9889 Other specified postprocedural states: Secondary | ICD-10-CM | POA: Insufficient documentation

## 2014-09-29 DIAGNOSIS — M889 Osteitis deformans of unspecified bone: Secondary | ICD-10-CM | POA: Insufficient documentation

## 2014-09-29 DIAGNOSIS — C801 Malignant (primary) neoplasm, unspecified: Secondary | ICD-10-CM

## 2014-09-29 DIAGNOSIS — R590 Localized enlarged lymph nodes: Secondary | ICD-10-CM | POA: Diagnosis not present

## 2014-09-29 LAB — GLUCOSE, CAPILLARY: Glucose-Capillary: 86 mg/dL (ref 65–99)

## 2014-09-29 MED ORDER — FLUDEOXYGLUCOSE F - 18 (FDG) INJECTION
11.5000 | Freq: Once | INTRAVENOUS | Status: DC | PRN
  Administered 2014-09-29: 11.5 via INTRAVENOUS
  Filled 2014-09-29: qty 11.5

## 2014-09-29 NOTE — Progress Notes (Signed)
Per Maggie--nctrcks.   Nicotrol inhaler has been approved.  Pa# [id number] 09/29/14-09/24/15 ref# [id number]. I will let wl know.

## 2014-09-30 ENCOUNTER — Encounter: Payer: Self-pay | Admitting: Radiation Therapy

## 2014-09-30 ENCOUNTER — Other Ambulatory Visit: Payer: Self-pay | Admitting: Hematology and Oncology

## 2014-09-30 ENCOUNTER — Other Ambulatory Visit: Payer: Self-pay | Admitting: Radiation Therapy

## 2014-09-30 ENCOUNTER — Ambulatory Visit: Payer: Medicaid Other | Admitting: Nutrition

## 2014-09-30 ENCOUNTER — Encounter: Payer: Self-pay | Admitting: Hematology and Oncology

## 2014-09-30 DIAGNOSIS — C3490 Malignant neoplasm of unspecified part of unspecified bronchus or lung: Secondary | ICD-10-CM

## 2014-09-30 NOTE — Progress Notes (Signed)
Checked in pt for front desk to see Megan Sims.Pt signed AOB.

## 2014-09-30 NOTE — Progress Notes (Signed)
1.  Do you need a wheel chair?    no  2. On oxygen? no  3. Have you ever had any surgery in the body part being scanned? Yes-  2 for an anurysm in late 1990's - at [facility]   4. Have you ever had any surgery on your brain or heart?                            Yes see above   5. Have you ever had surgery on your eyes or ears?          no                                                   6. Do you have a pacemaker or defibrillator?   no  7. Do you have a Neurostimulator?     No  8. Claustrophobic?no  9. Any risk for metal in eyes?  no  10. Injury by bullet, buckshot, or shrapnel?  no  11. Stent?    no                                                                                                                             12. Hx of Cancer?    Yes                                                                                                            Type  Lung   13. Kidney or Liver disease?  no  14. Hx of Lupus, Rheumatoid Arthritis or Scleroderma? no  15. IV Antibiotics or long term use of NSAIDS?  no  16. HX of Hypertension?  Yes  17. Diabetes?  no  18. Allergy to contrast?   no  19. Recent labs. Yes see Epic

## 2014-09-30 NOTE — Progress Notes (Signed)
59 year old female diagnosed with cancer with an unknown primary.  She is a patient of Dr. Alvy Bimler.  Past medical history includes migraines, hypertension, Paget's disease, renal insufficiency, and nicotine.  Medications include Diflucan.  Labs include glucose 136 and albumin 3.1 on August 20.  Height: 61 inches. Weight: 119.2 pounds September 7. Usual body weight: 140 pounds. BMI: 22.52.  Patient endorses a 20 pound weight loss. She does report dysphasia with solid foods and states she has had difficulty swallowing for about 1 month. She states she has issues with constipation but it is now improved. She has pain at her site of biopsy. She is consuming yogurt, milkshakes, soups and ensure twice a day. She states she has an excellent appetite.  Nutrition diagnosis:  Unintended weight loss related to dysphasia as evidenced by 15% weight loss over 4 weeks.  Intervention:  Patient educated to continue small frequent meals and snacks with soft moist foods. Recommended patient increase oral nutrition supplements 3 times a day between meals. Provided one complementary case of Ensure Plus. Educated patient on strategies for improving constipation. Provided fact sheets on increasing calories and protein and constipation. Questions were answered.  Teach back method used.  Contact information was provided. Coupons were given.  Monitoring, evaluation, Goals: Patient will work to increase oral intake to minimize further weight loss.  Next visit: To be scheduled.  **Disclaimer: This note was dictated with voice recognition software. Similar sounding words can inadvertently be transcribed and this note may contain transcription errors which may not have been corrected upon publication of note.**

## 2014-10-01 ENCOUNTER — Telehealth: Payer: Self-pay | Admitting: Hematology and Oncology

## 2014-10-01 ENCOUNTER — Ambulatory Visit (HOSPITAL_BASED_OUTPATIENT_CLINIC_OR_DEPARTMENT_OTHER): Payer: Medicaid Other | Admitting: Hematology and Oncology

## 2014-10-01 ENCOUNTER — Encounter: Payer: Self-pay | Admitting: Hematology and Oncology

## 2014-10-01 VITALS — BP 181/94 | HR 89 | Temp 98.1°F | Resp 18 | Ht 61.0 in | Wt 117.1 lb

## 2014-10-01 DIAGNOSIS — M542 Cervicalgia: Secondary | ICD-10-CM

## 2014-10-01 DIAGNOSIS — R1310 Dysphagia, unspecified: Secondary | ICD-10-CM | POA: Diagnosis not present

## 2014-10-01 DIAGNOSIS — I10 Essential (primary) hypertension: Secondary | ICD-10-CM | POA: Diagnosis not present

## 2014-10-01 DIAGNOSIS — C3492 Malignant neoplasm of unspecified part of left bronchus or lung: Principal | ICD-10-CM

## 2014-10-01 DIAGNOSIS — C3490 Malignant neoplasm of unspecified part of unspecified bronchus or lung: Secondary | ICD-10-CM

## 2014-10-01 DIAGNOSIS — F17200 Nicotine dependence, unspecified, uncomplicated: Secondary | ICD-10-CM | POA: Diagnosis not present

## 2014-10-01 DIAGNOSIS — C3491 Malignant neoplasm of unspecified part of right bronchus or lung: Secondary | ICD-10-CM

## 2014-10-01 DIAGNOSIS — G8929 Other chronic pain: Secondary | ICD-10-CM

## 2014-10-01 DIAGNOSIS — B370 Candidal stomatitis: Secondary | ICD-10-CM | POA: Diagnosis not present

## 2014-10-01 MED ORDER — POLYETHYLENE GLYCOL 3350 17 G PO PACK: 17.0000 g | PACK | Freq: Every day | ORAL | Status: DC

## 2014-10-01 MED ORDER — MORPHINE SULFATE 15 MG PO TABS: 15.0000 mg | ORAL_TABLET | Freq: Four times a day (QID) | ORAL | Status: DC | PRN

## 2014-10-01 NOTE — Progress Notes (Signed)
[facility] OFFICE PROGRESS NOTE  Patient Care Team: Rosita Fire, MD as PCP - General (Internal Medicine) Heath Lark, MD as Consulting Physician (Hematology and Oncology) Jodi Marble, MD as Consulting Physician (Otolaryngology)  SUMMARY OF ONCOLOGIC HISTORY:   Bilateral lung cancer   09/05/2014 - 09/12/2014 Hospital Admission The patient was admitted to the hospital for further management of possible skin reaction, dysphagia and supraclavicular lymphadenopathy   09/06/2014 Imaging CT scan of the neck showed malignant appearing superior mediastinal and right thoracic inlet lymphadenopathy   09/10/2014 Imaging CT scan of the abdomen and pelvis confirmed right supraclavicular and mediastinal lymphadenopathy, as described above but no other evidence of metastatic cancer    09/14/2014 Procedure Flexible endoscopy in the ENT office failed to reveal any primary in the head and neck region   09/16/2014 Imaging She failed barium swallow. 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 She returns for further follow-up. Her swallowing has improved. She is still in a lot of pain. She rated her neck pain as 9 out of 10 pain. Denies recent cough. She has almost completely quit smoking.  REVIEW OF SYSTEMS:   Constitutional: Denies fevers, chills or abnormal weight loss Eyes: Denies blurriness of vision Ears, nose, mouth, throat, and face: Denies mucositis or sore throat Respiratory: Denies cough, dyspnea or  wheezes Cardiovascular: Denies palpitation, chest discomfort or lower extremity swelling Gastrointestinal:  Denies nausea, heartburn or change in bowel habits Skin: Denies abnormal skin rashes Lymphatics: Denies new lymphadenopathy or easy bruising Neurological:Denies numbness, tingling or new weaknesses Behavioral/Psych: Mood is stable, no new changes  All other systems were reviewed with the patient and are negative.  I have reviewed the past medical history, past surgical history, social history and family history with the patient and they are unchanged from previous note.  ALLERGIES:  is allergic to hydrocodone-acetaminophen.  MEDICATIONS:  Current Outpatient Prescriptions  Medication Sig Dispense Refill  . amLODipine (NORVASC) 10 MG tablet Take 1 tablet (10 mg total) by mouth daily. 30 tablet 0  . carvedilol (COREG) 6.25 MG tablet Take 1 tablet (6.25 mg total) by mouth 2 (two) times daily with a meal. 60 tablet 0  . EPINEPHrine 0.3 mg/0.3 mL IJ SOAJ injection Inject 0.3 mLs (0.3 mg total) into the muscle once. 1 Device 0  . fentaNYL (DURAGESIC - DOSED MCG/HR) 25 MCG/HR patch Place 1 patch (25 mcg total) onto the skin every 3 (three) days. 5 patch 0  . fluconazole (DIFLUCAN) 100 MG tablet Take 1 tablet (100 mg total) by mouth daily. 7 tablet 0  . food thickener (THICK IT) POWD Take 1 Container by mouth as needed (to thicken liquids prn). 1 Can 0  . oxyCODONE (ROXICODONE) 5 MG immediate release tablet Take 0.5 tablets (2.5 mg total) by mouth every 4 (four) hours as needed for severe pain. 15 tablet 0  . tobramycin-dexamethasone (TOBRADEX) ophthalmic solution Place 1 drop into both eyes 2 (two) times daily.    Marland Kitchen triamcinolone cream (KENALOG) 0.1 % Apply 1 application topically 4 (four) times daily as needed. 30 g 1  . morphine (MSIR)  15 MG tablet Take 1 tablet (15 mg total) by mouth every 6 (six) hours as needed. 60 tablet 0  . nicotine (NICODERM CQ - DOSED IN MG/24 HOURS) 14 mg/24hr  patch Place 1 patch (14 mg total) onto the skin daily. (Patient not taking: Reported on 10/01/2014) 28 patch 0  . polyethylene glycol (MIRALAX) packet Take 17 g by mouth daily. 14 each 0   No current facility-administered medications for this visit.   Facility-Administered Medications Ordered in Other Visits  Medication Dose Route Frequency Provider Last Rate Last Dose  . fludeoxyglucose F - 18 (FDG) injection 11.5 milli Curie  11.5 milli Curie Intravenous Once PRN Medication Radiologist, MD   11.5 milli Curie at 09/29/14 [phone]    PHYSICAL EXAMINATION: ECOG PERFORMANCE STATUS: 1 - Symptomatic but completely ambulatory  Filed Vitals:   10/01/14 1033  BP: 181/94  Pulse: 89  Temp: 98.1 F (36.7 C)  Resp: 18   Filed Weights   10/01/14 1033  Weight: 117 lb 1.6 oz (53.116 kg)    GENERAL:alert, no distress and comfortable SKIN: skin color, texture, turgor are normal, no rashes or significant lesions EYES: normal, Conjunctiva are pink and non-injected, sclera clear OROPHARYNX:no exudate, no erythema and lips, buccal mucosa, and tongue normal . Oral thrush has resolved NECK: supple, thyroid normal size, non-tender, without nodularity LYMPH:  no palpable lymphadenopathy in the cervical, axillary or inguinal LUNGS: clear to auscultation and percussion with normal breathing effort HEART: regular rate & rhythm and no murmurs and no lower extremity edema ABDOMEN:abdomen soft, non-tender and normal bowel sounds Musculoskeletal:no cyanosis of digits and no clubbing  NEURO: alert & oriented x 3 with fluent speech, no focal motor/sensory deficits  LABORATORY DATA:  I have reviewed the data as listed    Component Value Date/Time   NA 135 09/12/2014 0450   K 3.8 09/12/2014 0450   CL 97* 09/12/2014 0450   CO2 28 09/12/2014 0450   GLUCOSE 136* 09/12/2014 0450   BUN 21* 09/12/2014 0450   CREATININE 0.88 09/12/2014 0450   CALCIUM 8.6* 09/12/2014 0450   PROT 5.8* 09/12/2014 0450   ALBUMIN 3.1*  09/12/2014 0450   AST 41 09/12/2014 0450   ALT 59* 09/12/2014 0450   ALKPHOS 135* 09/12/2014 0450   BILITOT 0.7 09/12/2014 0450   GFRNONAA >60 09/12/2014 0450   GFRAA >60 09/12/2014 0450    No results found for: SPEP, UPEP  Lab Results  Component Value Date   WBC 8.3 09/12/2014   NEUTROABS 7.5 09/10/2014   HGB 13.5 09/12/2014   HCT 40.1 09/12/2014   MCV 88.3 09/12/2014   PLT 236 09/12/2014      Chemistry      Component Value Date/Time   NA 135 09/12/2014 0450   K 3.8 09/12/2014 0450   CL 97* 09/12/2014 0450   CO2 28 09/12/2014 0450   BUN 21* 09/12/2014 0450   CREATININE 0.88 09/12/2014 0450      Component Value Date/Time   CALCIUM 8.6* 09/12/2014 0450   ALKPHOS 135* 09/12/2014 0450   AST 41 09/12/2014 0450   ALT 59* 09/12/2014 0450   BILITOT 0.7 09/12/2014 0450       RADIOGRAPHIC STUDIES: I reviewed the PET CT scan with her and family I have personally reviewed the radiological images as listed and agreed with the findings in the report.   ASSESSMENT & PLAN:  Bilateral lung cancer We reviewed the imaging study and her case extensively at the ENT tumor board. Overall impression is  the patient had primary lung cancer. Staging MRI of the brain is ordered for tomorrow. Assuming MRI of the brain is negative, the patient would be consider have locally advanced disease that could still be treated aggressively with concurrent chemoradiation therapy with a small possible chance of cure The patient will see radiation oncologist next week. I will schedule tomorrow education class, port placement and blood draw next week. I plan to see her on 10/12/2014 to consent for chemotherapy and to assess pain control. Plan for tentative start date of chemotherapy  on 10/14/2014  Chronic neck pain She felt that her pain is poorly controlled with fentanyl patch. Her swallowing has improved. I will switch her to I R morphine instead  Dysphagia The cause of dysphagia is  unknown. There is no mechanical obstruction that could explain the cause of her difficulties with swallowing. She did have thrush which was adequately treated with fluconazole and has improved with swallowing I will arrange for her to see speech and language therapist for formal swallow evaluation. I recommend frequent small meals. She has gained some weight.  Essential hypertension Her blood pressure is still high. However, she was not able to swallow pills for a while. The patient is now swallowing better and will resume taking all her blood pressure medications  Nicotine dependence I spent some time counseling the patient the importance of tobacco cessation. she is currently attempting to quit on her own  Oral thrush  This has resolved with fluconazole I suspect this was the major cause of her dysphagia.    Orders Placed This Encounter  Procedures  . IR Fluoro Guide CV Line Right    Indicate type of CVC ordering    Standing Status: Future     Number of Occurrences:      Standing Expiration Date: 12/01/2015    Order Specific Question:  Reason for exam:    Answer:  need port for chemo    Order Specific Question:  Is the patient pregnant?    Answer:  No    Order Specific Question:  Preferred Imaging Location?    Answer:  [facility]  . CBC with Differential    Standing Status: Standing     Number of Occurrences: 20     Standing Expiration Date: 10/02/2015  . Comprehensive metabolic panel    Standing Status: Standing     Number of Occurrences: 20     Standing Expiration Date: 10/02/2015   All questions were answered. The patient knows to call the clinic with any problems, questions or concerns. No barriers to learning was detected. I spent 30 minutes counseling the patient face to face. The total time spent in the appointment was 40 minutes and more than 50% was on counseling and review of test results     [facility], [place], MD 10/01/2014 5:48 PM

## 2014-10-01 NOTE — Assessment & Plan Note (Signed)
Her blood pressure is still high. However, she was not able to swallow pills for a while. The patient is now swallowing better and will resume taking all her blood pressure medications

## 2014-10-01 NOTE — Telephone Encounter (Signed)
Gave adn prnted appt sched and avs for pt for Sept and OCT

## 2014-10-01 NOTE — Assessment & Plan Note (Signed)
The cause of dysphagia is unknown. There is no mechanical obstruction that could explain the cause of her difficulties with swallowing. She did have thrush which was adequately treated with fluconazole and has improved with swallowing I will arrange for her to see speech and language therapist for formal swallow evaluation. I recommend frequent small meals. She has gained some weight.

## 2014-10-01 NOTE — Assessment & Plan Note (Signed)
I spent some time counseling the patient the importance of tobacco cessation. she is currently attempting to quit on her own 

## 2014-10-01 NOTE — Assessment & Plan Note (Signed)
We reviewed the imaging study and her case extensively at the ENT tumor board. Overall impression is the patient had primary lung cancer. Staging MRI of the brain is ordered for tomorrow. Assuming MRI of the brain is negative, the patient would be consider have locally advanced disease that could still be treated aggressively with concurrent chemoradiation therapy with a small possible chance of cure The patient will see radiation oncologist next week. I will schedule tomorrow education class, port placement and blood draw next week. I plan to see her on 10/12/2014 to consent for chemotherapy and to assess pain control. Plan for tentative start date of chemotherapy  on 10/14/2014

## 2014-10-01 NOTE — Assessment & Plan Note (Signed)
This has resolved with fluconazole I suspect this was the major cause of her dysphagia.

## 2014-10-01 NOTE — Assessment & Plan Note (Signed)
She felt that her pain is poorly controlled with fentanyl patch. Her swallowing has improved. I will switch her to I R morphine instead

## 2014-10-02 ENCOUNTER — Ambulatory Visit
Admission: RE | Admit: 2014-10-02 | Discharge: 2014-10-02 | Disposition: A | Discharge status: Home / Self Care | Payer: Medicaid Other | Source: Ambulatory Visit | Attending: Radiation Oncology | Admitting: Radiation Oncology

## 2014-10-02 DIAGNOSIS — C3490 Malignant neoplasm of unspecified part of unspecified bronchus or lung: Secondary | ICD-10-CM

## 2014-10-02 MED ORDER — GADOBENATE DIMEGLUMINE 529 MG/ML IV SOLN
10.0000 mL | Freq: Once | INTRAVENOUS | Status: AC | PRN
  Administered 2014-10-02: 10 mL via INTRAVENOUS

## 2014-10-03 ENCOUNTER — Other Ambulatory Visit: Payer: Self-pay | Admitting: Radiation Oncology

## 2014-10-03 ENCOUNTER — Ambulatory Visit
Admission: RE | Admit: 2014-10-03 | Discharge: 2014-10-03 | Disposition: A | Discharge status: Home / Self Care | Payer: Medicaid Other | Source: Ambulatory Visit | Attending: Radiation Oncology | Admitting: Radiation Oncology

## 2014-10-03 DIAGNOSIS — C3490 Malignant neoplasm of unspecified part of unspecified bronchus or lung: Secondary | ICD-10-CM

## 2014-10-03 MED ORDER — GADOBENATE DIMEGLUMINE 529 MG/ML IV SOLN: 10.0000 mL | Freq: Once | INTRAVENOUS | Status: DC | PRN

## 2014-10-06 ENCOUNTER — Other Ambulatory Visit: Payer: Self-pay | Admitting: Radiology

## 2014-10-06 ENCOUNTER — Encounter: Payer: Self-pay | Admitting: Radiation Oncology

## 2014-10-06 NOTE — Progress Notes (Unsigned)
Thoracic Location of Tumor / Histology: Overall impression is the patient had primary lung cancer.  Patient presented {numbers 1-12:[id number]} months ago with symptoms of: patient presented to the ED on 09/05/2014 complaining of 3-4 weeks of burning scalp and sores after getting her hair done. Also, increasing fatigue, weakness, difficulty swallowing food and neck and shoulder pain.   Biopsies revealed:    Tobacco/Marijuana/Snuff/ETOH use: patient attempting to stop smoking  Past/Anticipated interventions by cardiothoracic surgery, if any: biopsy  Past/Anticipated interventions by medical oncology, if any: Assuming MRI of the brain is negative, the patient would be consider have locally advanced disease that could still be treated aggressively with concurrent chemoradiation therapy with a small possible chance of cure. Scheduled for education class, port placement and blood draw next week.Plan to see her on 10/12/2014 to consent for chemotherapy and to assess pain control. Plan for tentative start date of chemotherapy on 10/14/2014.  Signs/Symptoms  Weight changes, if any: stable  Respiratory complaints, if any: none; POx 99%  Hemoptysis, if any: no  Pain issues, if any:  Yes; chronic neck pain. Recently switched to IR morphine.  SAFETY ISSUES:  Prior radiation? no  Pacemaker/ICD? no  Possible current pregnancy?no  Is the patient on methotrexate? no  Current Complaints / other details:  59 year old female. Referred by Dr. Alvy Bimler.

## 2014-10-07 ENCOUNTER — Encounter: Payer: Self-pay | Admitting: *Deleted

## 2014-10-07 ENCOUNTER — Other Ambulatory Visit: Payer: Self-pay | Admitting: Radiology

## 2014-10-07 ENCOUNTER — Ambulatory Visit
Admission: RE | Admit: 2014-10-07 | Discharge: 2014-10-07 | Disposition: A | Discharge status: Home / Self Care | Payer: Medicaid Other | Source: Ambulatory Visit | Attending: Radiation Oncology | Admitting: Radiation Oncology

## 2014-10-07 ENCOUNTER — Encounter: Payer: Self-pay | Admitting: Radiation Oncology

## 2014-10-07 VITALS — BP 145/81 | HR 79 | Temp 97.8°F | Ht 61.0 in | Wt 119.1 lb

## 2014-10-07 DIAGNOSIS — C3411 Malignant neoplasm of upper lobe, right bronchus or lung: Secondary | ICD-10-CM

## 2014-10-07 DIAGNOSIS — Z510 Encounter for antineoplastic radiation therapy: Secondary | ICD-10-CM | POA: Insufficient documentation

## 2014-10-07 DIAGNOSIS — C3490 Malignant neoplasm of unspecified part of unspecified bronchus or lung: Secondary | ICD-10-CM

## 2014-10-07 DIAGNOSIS — R21 Rash and other nonspecific skin eruption: Secondary | ICD-10-CM

## 2014-10-07 HISTORY — DX: Malignant neoplasm of unspecified part of unspecified bronchus or lung: C34.90

## 2014-10-07 NOTE — Progress Notes (Signed)
Thoracic Location of Tumor / Histology: Overall impression is the patient had primary lung cancer.  Patient presented  months ago with symptoms of: patient presented to the ED on 09/05/2014 complaining of 3-4 weeks of burning scalp and sores after getting her hair done. Also, increasing fatigue, weakness, difficulty swallowing food and neck and shoulder pain.   Biopsies revealed:    Tobacco/Marijuana/Snuff/ETOH use: patient attempting to stop smoking  Past/Anticipated interventions by cardiothoracic surgery, if any: biopsy  Past/Anticipated interventions by medical oncology, if any: Assuming MRI of the brain is negative, the patient would be consider have locally advanced disease that could still be treated aggressively with concurrent chemoradiation therapy with a small possible chance of cure. Scheduled for education class, port placement and blood draw next week.Plan to see her on 10/12/2014 to consent for chemotherapy and to assess pain control. Plan for tentative start date of chemotherapy on 10/14/2014.  Signs/Symptoms  Weight changes, if any:   Respiratory complaints, if any:   Hemoptysis, if any: no  Pain issues, if any:  Yes; chronic neck pain. Recently switched to IR morphine.  SAFETY ISSUES:  Prior radiation? no  Pacemaker/ICD?   Possible current pregnancy?no  Is the patient on methotrexate? no  Current Complaints / other details:  59 year old female. Referred by Dr. Alvy Bimler.

## 2014-10-07 NOTE — Progress Notes (Signed)
Radiation Oncology         [phone]) [phone] ________________________________  Initial outpatient Consultation  Name: Megan Sims MRN: [id number]  Date: 10/07/2014  DOB: 08-26-1955  KD:XIPJA,SNKNLZJ, MD  Heath Lark, MD   REFERRING PHYSICIAN: Heath Lark, MD  DIAGNOSIS:    ICD-9-CM ICD-10-CM   1. 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 female who presented with skin irritation, rash, and subsequent inhospitalization.  During hospitalization, supraclavicular adenopathy and dysphagia were additional isssues prompting work up.  CT of the neck on 09/06/14 revealed malignant appearing nodes in the superior mediastinum, right thoracic inlet.  On 09/10/14 CT of the chest, abdomen, and pelvis demonstrated lymphadenopathy in the right supraclavicular region and mediastinum with no evidence of cancer in the abdomen or pelvis.  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  PET scan as reviewed at tumor board imaged on 09/29/14 demonstrates hypermetabolic activity in the mediastinum and right supraclavicular region as well as the right hila region.  There is faint ground glass capacity in the right upper lobe which is favored unlikely to be the source of her adenoapothay.  No evidence of distance metastatic disease.  MRI of the brain on 10/03/14 is negative for metastatic disease.  It should be noted that her biopsy took place before her PET scan on 09/17/14.  This is labeled a lymph node biopsy, which to my understanding was from the right supraclavicular region.  Tissue revealed moderately to poorly differentiated carcinoma with squamous differentiation favored to be a lung primary.  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  Dr. Alvy Bimler plans to start chemotherapy on 10/14/14 in the form  of Carboplatin and Plclitaxel.  She has chronic neck pain which will be managed by Dr. Alvy Bimler.  She has a history of tobacco abuse.  She has stopped smoking since diagnosis.  She notes that her swallowing has improved since she has seen a swallowing therapist.  She focuses intensely while eating.  No hemoptysis.  She has lost about 20 lbs but this is related to odynophagia and dysphagia during her recent hospitalization which are now improving.  PREVIOUS RADIATION THERAPY: No  PAST MEDICAL HISTORY:  has a past medical history of Migraine; Lung abnormality; Hypertension; Pleurisy; Cerebral aneurysm (2 brain surgeries 96 or 97); Paget disease of bone; Diarrhea; Renal insufficiency; Malignant neoplasm of unknown origin; S/P biopsy; Oral thrush (09/24/2014); Nicotine dependence (09/24/2014); Chronic neck pain (09/24/2014); and Cancer.    PAST SURGICAL HISTORY: Past Surgical History  Procedure Laterality Date  . Chest tube insertion    . Tubal ligation    . Cerebral aneurysm repair  96 or 97  . Colonoscopy      FAMILY HISTORY: family history includes Cancer in her maternal grandmother and mother; Diabetes in an other family member; Hypertension in an other family member; Kidney disease in an other family member.  SOCIAL HISTORY:  reports that she has been smoking Cigarettes.  She has a 17 pack-year smoking history. She has never used smokeless tobacco. She reports that she does not drink alcohol or use illicit drugs.  ALLERGIES: Hydrocodone-acetaminophen  MEDICATIONS:  Current Outpatient Prescriptions  Medication Sig Dispense Refill  . amLODipine (NORVASC) 10 MG tablet Take 1 tablet (10 mg  total) by mouth daily. 30 tablet 0  . carvedilol (COREG) 6.25 MG tablet Take 1 tablet (6.25 mg total) by mouth 2 (two) times daily with a meal. 60 tablet 0  . EPINEPHrine 0.3 mg/0.3 mL IJ SOAJ injection Inject 0.3 mLs (0.3 mg total) into the muscle once. 1 Device 0  . morphine (MSIR) 15 MG tablet Take 1 tablet (15  mg total) by mouth every 6 (six) hours as needed. 60 tablet 0  . polyethylene glycol (MIRALAX) packet Take 17 g by mouth daily. 14 each 0  . triamcinolone cream (KENALOG) 0.1 % Apply 1 application topically 4 (four) times daily as needed. 30 g 1  . oxyCODONE (ROXICODONE) 5 MG immediate release tablet Take 0.5 tablets (2.5 mg total) by mouth every 4 (four) hours as needed for severe pain. (Patient not taking: Reported on 10/02/2014) 15 tablet 0  . polyethylene glycol powder (GLYCOLAX/MIRALAX) powder   0   No current facility-administered medications for this encounter.    REVIEW OF SYSTEMS:  Notable for that above.   PHYSICAL EXAM:  height is '5\' 1"'$  (1.549 m) and weight is 119 lb 1.6 oz (54.023 kg). Her temperature is 97.8 F (36.6 C). Her blood pressure is 145/81 and her pulse is 79. Her oxygen saturation is 99%.    General: Alert and oriented, in no acute distress.   HEENT: Head is normocephalic. Extraocular movements are intact. Oropharynx is clear. Neck: Healing biopsy scar over medial R supraclavicular region.  Small tender mass about 1 cm superior to scar.  Heart: Regular in rate and rhythm with no murmurs, rubs, or gallops. Chest: Clear to auscultation bilaterally, with no rhonchi, wheezes, or rales. Abdomen: Soft, nontender, nondistended, with no rigidity or guarding. Extremities: No cyanosis or edema. Lymphatics: see Neck Exam Skin:    Dry desquamation throughout arms, knees, feet, and hands.  Erythema around eyes. Musculoskeletal: symmetric strength and muscle tone throughout. Neurologic: Cranial nerves II through XII are grossly intact. No obvious focalities. Speech is fluent. Coordination is intact. Psychiatric: Judgment and insight are intact. Affect is appropriate.  ECOG = 1  0 - Asymptomatic (Fully active, able to carry on all predisease activities without restriction)  1 - Symptomatic but completely ambulatory (Restricted in physically strenuous activity but ambulatory and  able to carry out work of a light or sedentary nature. For example, light housework, office work)  2 - Symptomatic, <50% in bed during the day (Ambulatory and capable of all self care but unable to carry out any work activities. Up and about more than 50% of waking hours)  3 - Symptomatic, >50% in bed, but not bedbound (Capable of only limited self-care, confined to bed or chair 50% or more of waking hours)  4 - Bedbound (Completely disabled. Cannot carry on any self-care. Totally confined to bed or chair)  5 - Death   Eustace Pen MM, Creech RH, Tormey DC, et al. [phone]). "Toxicity and response criteria of the [facility]". [place] Oncol. 5 (6): 649-55   LABORATORY DATA:  Lab Results  Component Value Date   WBC 8.3 09/12/2014   HGB 13.5 09/12/2014   HCT 40.1 09/12/2014   MCV 88.3 09/12/2014   PLT 236 09/12/2014   CMP     Component Value Date/Time   NA 135 09/12/2014 0450   K 3.8 09/12/2014 0450   CL 97* 09/12/2014 0450   CO2 28 09/12/2014 0450   GLUCOSE 136* 09/12/2014 0450   BUN 21* 09/12/2014 0450  CREATININE 0.88 09/12/2014 0450   CALCIUM 8.6* 09/12/2014 0450   PROT 5.8* 09/12/2014 0450   ALBUMIN 3.1* 09/12/2014 0450   AST 41 09/12/2014 0450   ALT 59* 09/12/2014 0450   ALKPHOS 135* 09/12/2014 0450   BILITOT 0.7 09/12/2014 0450   GFRNONAA >60 09/12/2014 0450   GFRAA >60 09/12/2014 0450         RADIOGRAPHY: Dg Chest 1 View  09/08/2014   CLINICAL DATA:  Dysphagia, facial swelling, nonproductive cough, right-sided chest pain.  EXAM: CHEST  1 VIEW  COMPARISON:  09/05/2014  FINDINGS: The heart size and mediastinal contours are within normal limits. There is no evidence of focal airspace consolidation, pleural effusion or pneumothorax. There is mild atelectasis of the right lung base, possibly from splinting. The visualized skeletal structures are unremarkable. No displaced rib fractures are noted.  IMPRESSION: Mild right lower lobe atelectasis,  otherwise no evidence of acute cardiopulmonary process.   Electronically Signed   By: Fidela Salisbury M.D.   On: 09/08/2014 17:30   Ct Chest W Contrast  09/10/2014   CLINICAL DATA:  Malignancy suspected on CT neck. Evaluate for neoplasm.  EXAM: CT CHEST, ABDOMEN, AND PELVIS WITH CONTRAST  TECHNIQUE: Multidetector CT imaging of the chest, abdomen and pelvis was performed following the standard protocol during bolus administration of intravenous contrast.  CONTRAST:  48m OMNIPAQUE IOHEXOL 300 MG/ML  SOLN  COMPARISON:  CT neck dated 09/06/2014. [facility] CT chest dated 08/14/2014.  FINDINGS: CT CHEST FINDINGS  Mediastinum/Nodes: The heart is normal in size. No pericardial effusion.  Coronary atherosclerosis.  Atherosclerotic calcifications of the aortic arch.  Thoracic lymphadenopathy, including:  --11 mm short axis right supraclavicular node (series 21/image 1)  --17 mm short axis right superior mediastinal node (series 21/ image 14)  --10 mm short axis prevascular node (series 21/ image 20)  --17 mm short axis right lower paratracheal node (series 21/image 20)  Visualized thyroid is unremarkable.  Lungs/Pleura: Lungs are clear.  No suspicious pulmonary nodules.  No focal consolidation.  No pleural effusion or pneumothorax.  Musculoskeletal: Sclerosis with coarse trabeculations involving the T11 vertebral body, compatible with Paget's disease.  CT ABDOMEN PELVIS FINDINGS  Hepatobiliary: Liver is notable for focal fat/altered perfusion along the falciform ligament. No suspicious/enhancing hepatic lesions.  Gallbladder is unremarkable. No intrahepatic or extrahepatic ductal dilatation.  Pancreas: Within normal limits.  Spleen: Small mild residual splenic tissue in the left upper abdomen (series 21/ image 45).  Adrenals/Urinary Tract: Adrenal glands are within normal limits.  Kidneys are within normal limits.  No hydronephrosis.  Bladder is within normal limits.  Stomach/Bowel: Wall thickening along the posterior  gastric cardia/body, likely related to underdistention.  No evidence of bowel obstruction.  No colonic wall thickening or mass is seen.  Vascular/Lymphatic: Atherosclerotic calcifications of the abdominal aorta and branch vessels.  No suspicious abdominopelvic lymphadenopathy.  Reproductive: Uterus is unremarkable.  Bilateral ovaries are within normal limits.  Other: No abdominopelvic ascites.  Tiny fat containing right inguinal hernia (series 21/image 95).  Musculoskeletal: Sclerosis with coarsened trabeculation involving L1, L4, L5, the sacrum, and the bilateral pelvis, compatible with known Paget's disease.  IMPRESSION: Right supraclavicular and mediastinal lymphadenopathy, as described above.  Primary differential considerations include lymphoma, occult (nonvisualized) primary bronchogenic neoplasm, or less likely occult head/neck primary neoplasm. Consider sampling of the right supraclavicular node for further characterization.  Paget's disease involving the lower thoracolumbar spine and pelvis.   Electronically Signed   By: SJulian HyM.D.   On: 09/10/2014  14:46   Mr Jeri Cos YQ Contrast  10/03/2014   CLINICAL DATA:  New diagnosis of bilateral lung cancer. Personal history of bilateral craniotomies with aneurysm clips.  EXAM: MRI HEAD WITHOUT AND WITH CONTRAST  TECHNIQUE: Multiplanar, multiecho pulse sequences of the brain and surrounding structures were obtained without and with intravenous contrast.  CONTRAST:  10 mL MultiHance  COMPARISON:  CT head without contrast 03/14/2012.  FINDINGS: Bilateral pterional craniotomies are present for ICA aneurysm clips. These clips create moderate artifact on some sequences.  Extensive atrophy and white matter disease is advanced for age. A remote left paramedian pontine infarct is noted. Remote lacunar infarcts are present in the thalami, more prominent on the right. A remote left caudate head infarct is present.  No acute hemorrhage is present.  The ventricles  are of normal size.  The postcontrast images demonstrate no pathologic enhancement. There is no evidence for metastatic disease of the brain or meninges. Artifact from the aneurysm clips obscures only minimal portions of the medial temporal lobes bilaterally.  Flow is present in the visible major intracranial arteries. The globes and orbits are intact. A remote right medial blowout fracture is noted in the right orbit. The paranasal sinuses and mastoid air cells are clear.  IMPRESSION: 1. No evidence for metastatic disease to the brain. 2. Advanced atrophy and white matter disease compatible with chronic microvascular ischemia. 3. Remote lacunar infarcts of the basal ganglia bilaterally as well as the left paramedian pons. 4. Bilateral pterional craniotomies and ICA aneurysm clips are stable.   Electronically Signed   By: San Morelle M.D.   On: 10/03/2014 11:53   Ct Abdomen Pelvis W Contrast  09/10/2014   CLINICAL DATA:  Malignancy suspected on CT neck. Evaluate for neoplasm.  EXAM: CT CHEST, ABDOMEN, AND PELVIS WITH CONTRAST  TECHNIQUE: Multidetector CT imaging of the chest, abdomen and pelvis was performed following the standard protocol during bolus administration of intravenous contrast.  CONTRAST:  81m OMNIPAQUE IOHEXOL 300 MG/ML  SOLN  COMPARISON:  CT neck dated 09/06/2014. [facility] CT chest dated 08/14/2014.  FINDINGS: CT CHEST FINDINGS  Mediastinum/Nodes: The heart is normal in size. No pericardial effusion.  Coronary atherosclerosis.  Atherosclerotic calcifications of the aortic arch.  Thoracic lymphadenopathy, including:  --11 mm short axis right supraclavicular node (series 21/image 1)  --17 mm short axis right superior mediastinal node (series 21/ image 14)  --10 mm short axis prevascular node (series 21/ image 20)  --17 mm short axis right lower paratracheal node (series 21/image 20)  Visualized thyroid is unremarkable.  Lungs/Pleura: Lungs are clear.  No suspicious pulmonary nodules.  No  focal consolidation.  No pleural effusion or pneumothorax.  Musculoskeletal: Sclerosis with coarse trabeculations involving the T11 vertebral body, compatible with Paget's disease.  CT ABDOMEN PELVIS FINDINGS  Hepatobiliary: Liver is notable for focal fat/altered perfusion along the falciform ligament. No suspicious/enhancing hepatic lesions.  Gallbladder is unremarkable. No intrahepatic or extrahepatic ductal dilatation.  Pancreas: Within normal limits.  Spleen: Small mild residual splenic tissue in the left upper abdomen (series 21/ image 45).  Adrenals/Urinary Tract: Adrenal glands are within normal limits.  Kidneys are within normal limits.  No hydronephrosis.  Bladder is within normal limits.  Stomach/Bowel: Wall thickening along the posterior gastric cardia/body, likely related to underdistention.  No evidence of bowel obstruction.  No colonic wall thickening or mass is seen.  Vascular/Lymphatic: Atherosclerotic calcifications of the abdominal aorta and branch vessels.  No suspicious abdominopelvic lymphadenopathy.  Reproductive: Uterus is unremarkable.  Bilateral ovaries are within normal limits.  Other: No abdominopelvic ascites.  Tiny fat containing right inguinal hernia (series 21/image 95).  Musculoskeletal: Sclerosis with coarsened trabeculation involving L1, L4, L5, the sacrum, and the bilateral pelvis, compatible with known Paget's disease.  IMPRESSION: Right supraclavicular and mediastinal lymphadenopathy, as described above.  Primary differential considerations include lymphoma, occult (nonvisualized) primary bronchogenic neoplasm, or less likely occult head/neck primary neoplasm. Consider sampling of the right supraclavicular node for further characterization.  Paget's disease involving the lower thoracolumbar spine and pelvis.   Electronically Signed   By: Julian Hy M.D.   On: 09/10/2014 14:46   Dg Esophagus  09/16/2014   CLINICAL DATA:  Dysphagia  EXAM: ESOPHOGRAM/BARIUM SWALLOW   TECHNIQUE: Single contrast examination was performed using  thin barium.  FLUOROSCOPY TIME:  Radiation Exposure Index (as provided by the fluoroscopic device):  If the device does not provide the exposure index:  Fluoroscopy Time:  1 minutes 48 seconds  Number of Acquired Images:  COMPARISON:  None.  FINDINGS: The patient is on thick liquids due to history of aspiration during recent modified barium swallow examination.  In the upright position, with thin liquids, there was penetration and coating of the undersurface of the epiglottis. Barium extended down to the cord.  There was aspiration with thin liquids with the patient in the prone position. Patient had mild coughing during penetration and aspiration. No additional barium was given due to aspiration.  The esophageal mucosa and motility are normal. No stricture or mass lesion is identified. Suboptimal evaluation of esophagus due to limited barium ingestion. No hiatal hernia identified.  IMPRESSION: Aspiration occurred with thin barium. Patient coughed. Limited barium administration due to aspiration.  No definite esophageal abnormality.   Electronically Signed   By: Franchot Gallo M.D.   On: 09/16/2014 10:32   Nm Pet Image Initial (pi) Skull Base To Thigh  09/29/2014   CLINICAL DATA:  Initial treatment strategy for metastatic carcinoma with squamous differentiation, primary site uncertain.  EXAM: NUCLEAR MEDICINE PET SKULL BASE TO THIGH  TECHNIQUE: 11.5 mCi F-18 FDG was injected intravenously. Full-ring PET imaging was performed from the skull base to thigh after the radiotracer. CT data was obtained and used for attenuation correction and anatomic localization.  FASTING BLOOD GLUCOSE:  Value: 86 mg/dl  COMPARISON:  Multiple exams, including 09/10/2014  FINDINGS: NECK  Nonspecific activity along the adenoids mildly hypermetabolic with maximum standard uptake value 5.1. Right supraclavicular lymph node just behind the clavicle measuring 0.7 cm in short axis on  image 60 series 4 has maximum standard uptake value 8.2. There is adjacent gas in the soft tissues likely from recent surgical biopsy. Clips are noted lateral to the cavernous sinuses bilaterally.  CHEST  Hypermetabolic mediastinal adenopathy. Right prevascular node measuring 2.0 cm in short axis on image 78 series 4 has maximum standard uptake value 18.6. Right paratracheal node measuring 2.0 cm in short axis on image 81 series 4 has maximum standard uptake value 19.7. Left eccentric prevascular node measuring 2.2 cm in short axis on image 80 series 4 has maximum standard uptake value 15.6. A confluence of ill-defined ground-glass opacity in the right upper lobe centrally has a maximum standard uptake value of 2.8. Right hilar hypermetabolic node with maximum standard uptake value 14.2 noted.  ABDOMEN/PELVIS  No abnormal hypermetabolic activity within the liver, pancreas, adrenal glands, or spleen. No hypermetabolic lymph nodes in the abdomen or pelvis. Aortoiliac atherosclerotic vascular disease. No abnormal anorectal activity identified.  SKELETON  Pagetoid appearance of T11, L1, L5, and of the entire bony pelvis. This is not accompanied by significant hypermetabolic bony activity.  Prior bilateral craniotomies.  IMPRESSION: 1. Hypermetabolic mediastinal and right hilar adenopathy. Very faint ground-glass opacity in the right upper lobe with low-grade hypermetabolic activity, conceivably malignant with maximum standard uptake value of 2.8, but seemingly unlikely to be the source of all of this prominent adenopathy in the chest given how ill-defined and sub solid the pulmonary nodularity as. 2. Low-level focus of activity centrally along the adenoids, maximum standard uptake value 5.1, probably from residual active lymphoid tissue rather than as a primary source of the disease elsewhere in the chest. 3. Postoperative findings adjacent to a hypermetabolic right supraclavicular lymph node. 4. Paget's disease in the  lower thoracic spine, parts of the lumbar spine, and bony pelvis.   Electronically Signed   By: Van Clines M.D.   On: 09/29/2014 11:01   Dg Swallowing Func-speech Pathology  09/08/2014    Objective Swallowing Evaluation:    Patient Details  Name: Megan Sims MRN: [id number] Date of Birth: 1955/05/04  Today's Date: 09/08/2014 Time: SLP Start Time (ACUTE ONLY): 1505-SLP Stop Time (ACUTE ONLY): 1534 SLP Time Calculation (min) (ACUTE ONLY): 29 min  Past Medical History:  Past Medical History  Diagnosis Date  . Migraine   . Lung abnormality   . Hypertension   . Pleurisy   . Cerebral aneurysm 2 brain surgeries 96 or 97  . Paget disease of bone   . Diarrhea    Past Surgical History:  Past Surgical History  Procedure Laterality Date  . Chest tube insertion    . Tubal ligation    . Cerebral aneurysm repair  70 or 67   HPI:  Other Pertinent Information: Megan Sims is a 59 y.o. female with past  medical history of HTN, cerebral aneurysm, Paget disease of the bone,  Pleurisy, and lung abnormality. Pt initially presented  3-4 weeks ago  after developing burning scalp and sores shortly after getting her hair  done. Went to [employer] and then came here as she was not any better.  States she felt better for the first 2 days after coming to ED on 08/21/14.  Since that time has developed increasing fatigue, weakness, difficulty  swallowing food, and neck and shoulder pain. Skin whelps never fully  resolved but improved on steroids. Significant worsening over past 2 days.  Last dose of steroids ('20mg'$ ) 2 days ago. [name] like she has "pepper on  my tongue." ST consulted for bedside swallow examination. Chest x ray  negative for acute abnormalities. CT of Neck significant for Nonspecific  generalized pharyngeal mucosal swelling and  No Data Recorded  Assessment / Plan / Recommendation CHL IP CLINICAL IMPRESSIONS 09/08/2014  Therapy Diagnosis Moderate to Severe pharyngeal phase dysphagia  Clinical Impression Pt presents with  moderate to severe pharyngeal  dysphagia with acute onset of unknown etiology. Reduced epiglottic  inversion and reduced base of tongue retraction yielded limited propulsion  of bolus throughout pharynx and resulted in moderate residuals in both  valleculae and pyriform sinuses. As study progressed these residuals mixed  with further PO trials and spilled into laryngeal vestibule to eventually  become aspirated. Pt with aspiration of thin, nectar thick, and honey  thick consistencies. Although each incident was sensed, timing and  coordination of cough and throat clear was ineffective to clear aspirates  once they passed below true vocal cords. Aspirate amount noted to be less  as consistency thickened. Swallow strategies attempted including chin tuck  and supraglottic swallow. These reduced aspirate amounts but did not  completely protect airway. Consulted with MD regarding nutritional needs  and pts very high risk of aspiration. Recommend dysphagia 1 and honey  thick liquids with strict aspiration precautions. Medicines crushed with  puree. Recommend ENT consult to further evaluate pharyngeal phase of  swallowing. ST to follow up and closely monitor for further training of  safe swallow strategies       CHL IP TREATMENT RECOMMENDATION 09/08/2014  Treatment Recommendations Therapy as outlined in treatment plan below     CHL IP DIET RECOMMENDATION 09/08/2014  SLP Diet Recommendations Dysphagia 1 (Puree);Honey  Liquid Administration via (None)  Medication Administration Crushed with puree  Compensations Small sips/bites;Effortful swallow;Hard cough after  swallow;Clear throat intermittently;Follow solids with liquid;Chin tuck  Postural Changes and/or Swallow Maneuvers (None)     CHL IP OTHER RECOMMENDATIONS 09/08/2014  Recommended Consults Consider ENT evaluation  Oral Care Recommendations Oral care BID  Other Recommendations Order thickener from pharmacy     No flowsheet data found.   CHL IP FREQUENCY AND DURATION  09/08/2014  Speech Therapy Frequency (ACUTE ONLY) min 2x/week  Treatment Duration 2 weeks     Pertinent Vitals/Pain     SLP Swallow Goals No flowsheet data found.  No flowsheet data found.    CHL IP REASON FOR REFERRAL 09/08/2014  Reason for Referral Objectively evaluate swallowing function                 Arvil Chaco MA, CCC-SLP Acute Care Speech Language Pathologist    Levi Aland 09/08/2014, 4:22 PM       IMPRESSION/PLAN:   Today, I talked to the patient about the findings and work-up thus far. We discussed the patient's diagnosis of lung disease suggestive of locally advanced primary lung cancer and general treatment for this, highlighting the role of radiotherapy in the management. We discussed the available radiation techniques, and focused on the details of logistics and delivery.    She will receive concurrent chemotherapy  We discussed the risks, benefits, and side effects of radiotherapy. Side effects may include but not necessarily be limited to: skin irritation, fatigue, esophagitis, rare internal organ injury. No guarantees of treatment were given. A consent form was signed and placed in the patient's medical record. The patient was encouraged to ask questions that I answered to the best of my ability.   Simulation to take place today.  IMRT may be necessary to adequately spare the patient's spinal cord, lungs, and or heart from excessive dose.  I anticipate 6-7 weeks of radiotherapy.  Refer to dermatology Refer to nutrition Order PFTs  __________________________________________   Eppie Gibson, MD

## 2014-10-07 NOTE — Progress Notes (Signed)
Attempted IV access x 1, lac, good blood return but when started to flush catheter tip just fell out as I was attempting to secure with tape, Elmo Putt RN attempted x 1 RAC, unanble to get IV access, Joaquim Lai RN tried x 1, 3 attempts unsuccessful,infomred Dr. Isidore Moos, a no go, called and spoke with Amy,RT patient is in room 8, apologize to the patient,patient stated "that's all right, they have a difficult time  With that" getting a port a cath in the near future 9:23 AM

## 2014-10-07 NOTE — Progress Notes (Signed)
  Radiation Oncology         [phone]) [phone] ________________________________  Name: RAKEYA GLAB MRN: [id number]  Date: 10/07/2014  DOB: [birth year]  SIMULATION AND TREATMENT PLANNING NOTE  Outpatient  DIAGNOSIS:     ICD-9-CM ICD-10-CM   1. 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  Identity was confirmed.  All relevant records and images related to the planned course of therapy were reviewed.  The patient freely provided informed written consent to proceed with treatment after reviewing the details related to the planned course of therapy. The consent form was witnessed and verified by the simulation staff.    Then, the patient was set-up in a stable reproducible  supine position for radiation therapy.  CT images were obtained.  Surface markings were placed.  The CT images were loaded into the planning software.    TREATMENT PLANNING NOTE: Treatment planning then occurred.  The radiation prescription was entered and confirmed.    A total of 4 medically necessary complex treatment devices were fabricated and supervised by me (4 fields with MLCs to block lungs, cord, esophagus, heart). I have requested : 3D Simulation  I have requested a DVH of the following structures: lungs, cord, esophagus, heart, brachial plexus.   The patient will receive 60 Gy in 30 fractions.  Special Treatment Procedure Note: The patient will be receiving chemotherapy concurrently. Chemotherapy heightens the risk of side effects. I have considered this during the patient's treatment planning process and will monitor the patient accordingly for side effects on a weekly basis. Concurrent chemotherapy increases the complexity of this patient's treatment and therefore this constitutes a special treatment procedure.  -----------------------------------  Eppie Gibson, MD

## 2014-10-08 ENCOUNTER — Other Ambulatory Visit: Payer: Self-pay | Admitting: Hematology and Oncology

## 2014-10-08 ENCOUNTER — Ambulatory Visit (HOSPITAL_COMMUNITY)
Admission: RE | Admit: 2014-10-08 | Discharge: 2014-10-08 | Disposition: A | Discharge status: Home / Self Care | Payer: Medicaid Other | Source: Ambulatory Visit | Attending: Hematology and Oncology | Admitting: Hematology and Oncology

## 2014-10-08 ENCOUNTER — Encounter (HOSPITAL_COMMUNITY): Payer: Self-pay

## 2014-10-08 DIAGNOSIS — C779 Secondary and unspecified malignant neoplasm of lymph node, unspecified: Secondary | ICD-10-CM | POA: Diagnosis not present

## 2014-10-08 DIAGNOSIS — C3491 Malignant neoplasm of unspecified part of right bronchus or lung: Secondary | ICD-10-CM

## 2014-10-08 DIAGNOSIS — C3490 Malignant neoplasm of unspecified part of unspecified bronchus or lung: Secondary | ICD-10-CM | POA: Insufficient documentation

## 2014-10-08 DIAGNOSIS — C3492 Malignant neoplasm of unspecified part of left bronchus or lung: Principal | ICD-10-CM

## 2014-10-08 LAB — BASIC METABOLIC PANEL
ANION GAP: 9 (ref 5–15)
BUN: 12 mg/dL (ref 6–20)
CHLORIDE: 106 mmol/L (ref 101–111)
CO2: 26 mmol/L (ref 22–32)
Calcium: 8.9 mg/dL (ref 8.9–10.3)
Creatinine, Ser: 0.96 mg/dL (ref 0.44–1.00)
GFR calc non Af Amer: >60 mL/min (ref >60)
GLUCOSE: 99 mg/dL (ref 65–99)
POTASSIUM: 3.7 mmol/L (ref 3.5–5.1)
Sodium: 141 mmol/L (ref 135–145)

## 2014-10-08 LAB — APTT: aPTT: 27 seconds (ref 24–37)

## 2014-10-08 LAB — CBC
HEMATOCRIT: 33.9 % — AB (ref 36.0–46.0)
HEMOGLOBIN: 11.3 g/dL — AB (ref 12.0–15.0)
MCH: 30.1 pg (ref 26.0–34.0)
MCHC: 33.3 g/dL (ref 30.0–36.0)
MCV: 90.4 fL (ref 78.0–100.0)
Platelets: 476 10*3/uL — ABNORMAL HIGH (ref 150–400)
RBC: 3.75 MIL/uL — AB (ref 3.87–5.11)
RDW: 16.9 % — ABNORMAL HIGH (ref 11.5–15.5)
WBC: 6.7 10*3/uL (ref 4.0–10.5)

## 2014-10-08 LAB — PROTIME-INR
INR: 0.97 (ref 0.00–1.49)
PROTHROMBIN TIME: 13.1 s (ref 11.6–15.2)

## 2014-10-08 MED ORDER — FENTANYL CITRATE (PF) 100 MCG/2ML IJ SOLN
INTRAMUSCULAR | Status: AC | PRN
  Administered 2014-10-08: 50 ug via INTRAVENOUS

## 2014-10-08 MED ORDER — MIDAZOLAM HCL 2 MG/2ML IJ SOLN
INTRAMUSCULAR | Status: AC
  Filled 2014-10-08: qty 6

## 2014-10-08 MED ORDER — CEFAZOLIN SODIUM-DEXTROSE 2-3 GM-% IV SOLR
INTRAVENOUS | Status: AC
  Filled 2014-10-08: qty 50

## 2014-10-08 MED ORDER — LIDOCAINE HCL 1 % IJ SOLN
INTRAMUSCULAR | Status: AC
  Filled 2014-10-08: qty 20

## 2014-10-08 MED ORDER — FENTANYL CITRATE (PF) 100 MCG/2ML IJ SOLN
INTRAMUSCULAR | Status: AC
  Filled 2014-10-08: qty 4

## 2014-10-08 MED ORDER — MIDAZOLAM HCL 2 MG/2ML IJ SOLN
INTRAMUSCULAR | Status: AC | PRN
  Administered 2014-10-08 (×4): 1 mg via INTRAVENOUS

## 2014-10-08 MED ORDER — LIDOCAINE-EPINEPHRINE 2 %-1:100000 IJ SOLN
INTRAMUSCULAR | Status: AC
  Filled 2014-10-08: qty 1

## 2014-10-08 MED ORDER — CEFAZOLIN SODIUM-DEXTROSE 2-3 GM-% IV SOLR
2.0000 g | INTRAVENOUS | Status: AC
  Administered 2014-10-08: 2 g via INTRAVENOUS

## 2014-10-08 MED ORDER — SODIUM CHLORIDE 0.9 % IV SOLN
INTRAVENOUS | Status: DC
Start: 2014-10-08 — End: 2014-10-09
  Administered 2014-10-08: 12:00:00 via INTRAVENOUS

## 2014-10-08 MED ORDER — HEPARIN SOD (PORK) LOCK FLUSH 100 UNIT/ML IV SOLN
INTRAVENOUS | Status: AC
  Filled 2014-10-08: qty 5

## 2014-10-08 NOTE — Discharge Instructions (Signed)
Implanted Port Insertion, Care After °Refer to this sheet in the next few weeks. These instructions provide you with information on caring for yourself after your procedure. Your health care provider may also give you more specific instructions. Your treatment has been planned according to current medical practices, but problems sometimes occur. Call your health care provider if you have any problems or questions after your procedure. °WHAT TO EXPECT AFTER THE PROCEDURE °After your procedure, it is typical to have the following:  °· Discomfort at the port insertion site. Ice packs to the area will help. °· Bruising on the skin over the port. This will subside in 3-4 days. °HOME CARE INSTRUCTIONS °· After your port is placed, you will get a manufacturer's information card. The card has information about your port. Keep this card with you at all times.   °· Know what kind of port you have. There are many types of ports available.   °· Wear a medical alert bracelet in case of an emergency. This can help alert health care workers that you have a port.   °· The port can stay in for as long as your health care provider believes it is necessary.   °· A home health care nurse may give medicines and take care of the port.   °· You or a family member can get special training and directions for giving medicine and taking care of the port at home.   °SEEK MEDICAL CARE IF:  °· Your port does not flush or you are unable to get a blood return.   °· You have a fever or chills. °SEEK IMMEDIATE MEDICAL CARE IF: °· You have new fluid or pus coming from your incision.   °· You notice a bad smell coming from your incision site.   °· You have swelling, pain, or more redness at the incision or port site.   °· You have chest pain or shortness of breath. °Document Released: 10/30/2012 Document Revised: 01/14/2013 Document Reviewed: 10/30/2012 °ExitCare® Patient Information ©2015 [employer]. This information is not intended to replace  advice given to you by your health care provider. Make sure you discuss any questions you have with your health care provider. °Implanted Port Home Guide °An implanted port is a type of central line that is placed under the skin. Central lines are used to provide IV access when treatment or nutrition needs to be given through a person's veins. Implanted ports are used for long-term IV access. An implanted port may be placed because:  °· You need IV medicine that would be irritating to the small veins in your hands or arms.   °· You need long-term IV medicines, such as antibiotics.   °· You need IV nutrition for a long period.   °· You need frequent blood draws for lab tests.   °· You need dialysis.   °Implanted ports are usually placed in the chest area, but they can also be placed in the upper arm, the abdomen, or the leg. An implanted port has two main parts:  °· Reservoir. The reservoir is round and will appear as a small, raised area under your skin. The reservoir is the part where a needle is inserted to give medicines or draw blood.   °· Catheter. The catheter is a thin, flexible tube that extends from the reservoir. The catheter is placed into a large vein. Medicine that is inserted into the reservoir goes into the catheter and then into the vein.   °HOW WILL I CARE FOR MY INCISION SITE? °Do not get the incision site wet. Bathe or   shower as directed by your health care provider.  °HOW IS MY PORT ACCESSED? °Special steps must be taken to access the port:  °· Before the port is accessed, a numbing cream can be placed on the skin. This helps numb the skin over the port site.   °· Your health care provider uses a sterile technique to access the port. °· Your health care provider must put on a mask and sterile gloves. °· The skin over your port is cleaned carefully with an antiseptic and allowed to dry. °· The port is gently pinched between sterile gloves, and a needle is inserted into the port. °· Only  "non-coring" port needles should be used to access the port. Once the port is accessed, a blood return should be checked. This helps ensure that the port is in the vein and is not clogged.   °· If your port needs to remain accessed for a constant infusion, a clear (transparent) bandage will be placed over the needle site. The bandage and needle will need to be changed every week, or as directed by your health care provider.   °· Keep the bandage covering the needle clean and dry. Do not get it wet. Follow your health care provider's instructions on how to take a shower or bath while the port is accessed.   °· If your port does not need to stay accessed, no bandage is needed over the port.   °WHAT IS FLUSHING? °Flushing helps keep the port from getting clogged. Follow your health care provider's instructions on how and when to flush the port. Ports are usually flushed with saline solution or a medicine called heparin. The need for flushing will depend on how the port is used.  °· If the port is used for intermittent medicines or blood draws, the port will need to be flushed:   °· After medicines have been given.   °· After blood has been drawn.   °· As part of routine maintenance.   °· If a constant infusion is running, the port may not need to be flushed.   °HOW LONG WILL MY PORT STAY IMPLANTED? °The port can stay in for as long as your health care provider thinks it is needed. When it is time for the port to come out, surgery will be done to remove it. The procedure is similar to the one performed when the port was put in.  °WHEN SHOULD I SEEK IMMEDIATE MEDICAL CARE? °When you have an implanted port, you should seek immediate medical care if:  °· You notice a bad smell coming from the incision site.   °· You have swelling, redness, or drainage at the incision site.   °· You have more swelling or pain at the port site or the surrounding area.   °· You have a fever that is not controlled with medicine. °Document  Released: 01/09/2005 Document Revised: 10/30/2012 Document Reviewed: 09/16/2012 °ExitCare® Patient Information ©2015 [employer]. This information is not intended to replace advice given to you by your health care provider. Make sure you discuss any questions you have with your health care provider.Conscious Sedation, Adult, Care After °Refer to this sheet in the next few weeks. These instructions provide you with information on caring for yourself after your procedure. Your health care provider may also give you more specific instructions. Your treatment has been planned according to current medical practices, but problems sometimes occur. Call your health care provider if you have any problems or questions after your procedure. °WHAT TO EXPECT AFTER THE PROCEDURE  °After your procedure: °·   You may feel sleepy, clumsy, and have poor balance for several hours. °· Vomiting may occur if you eat too soon after the procedure. °HOME CARE INSTRUCTIONS °· Do not participate in any activities where you could become injured for at least 24 hours. Do not: °¨ Drive. °¨ Swim. °¨ Ride a bicycle. °¨ Operate heavy machinery. °¨ Cook. °¨ Use power tools. °¨ Climb ladders. °¨ Work from a high place. °· Do not make important decisions or sign legal documents until you are improved. °· If you vomit, drink water, juice, or soup when you can drink without vomiting. Make sure you have little or no nausea before eating solid foods. °· Only take over-the-counter or prescription medicines for pain, discomfort, or fever as directed by your health care provider. °· Make sure you and your family fully understand everything about the medicines given to you, including what side effects may occur. °· You should not drink alcohol, take sleeping pills, or take medicines that cause drowsiness for at least 24 hours. °· If you smoke, do not smoke without supervision. °· If you are feeling better, you may resume normal activities 24 hours after you  were sedated. °· Keep all appointments with your health care provider. °SEEK MEDICAL CARE IF: °· Your skin is pale or bluish in color. °· You continue to feel nauseous or vomit. °· Your pain is getting worse and is not helped by medicine. °· You have bleeding or swelling. °· You are still sleepy or feeling clumsy after 24 hours. °SEEK IMMEDIATE MEDICAL CARE IF: °· You develop a rash. °· You have difficulty breathing. °· You develop any type of allergic problem. °· You have a fever. °MAKE SURE YOU: °· Understand these instructions. °· Will watch your condition. °· Will get help right away if you are not doing well or get worse. °Document Released: 10/30/2012 Document Reviewed: 10/30/2012 °ExitCare® Patient Information ©2015 [employer]. This information is not intended to replace advice given to you by your health care provider. Make sure you discuss any questions you have with your health care provider. ° °

## 2014-10-08 NOTE — Procedures (Signed)
LEFT  IJ Port cathter placement with [place] and fluoroscopy No complication No blood loss. See complete dictation in [facility].

## 2014-10-08 NOTE — Progress Notes (Addendum)
  Oncology Nurse Navigator Documentation   Navigator Encounter Type: Initial RadOnc (10/07/14 0805)     Met with patient during initial consult with Dr. Isidore Moos.  She was accompanied by her dtr, son-in-law, son.  I had previously met with patient on 09/24/14 during consult with Dr. Alvy Bimler.   She reported:  Improved swallowing and oral intake since adhering to Dr. Calton Dach guidance of focused, frequent eating of small amounts of food.  Absence of pain. I provided introductory explanation of radiation treatment including CT SIM planning, explained treatment and arrival procedures. She verbalized understanding of Dr. Pearlie Oyster:  Explanation of PET results, indication of thoracic LN involvement, absence of lung tissue involvement.  Pending order for a pulmonary function test.  I noted that I will transition her navigation to Thoracic Oncology Navigator; until then I encouraged them to contact me with questions/concerns. She proceeded to [facility] after appt.  They verbalized understanding of information provided.    Gayleen Orem, RN, BSN, [facility] [phone]                      Time Spent with Patient: 60 (10/07/14 0805)

## 2014-10-08 NOTE — H&P (Signed)
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 She has been seen by Dr. Alvy Bimler and Dr. Isidore Moos and is scheduled today for image guided port a catheter placement.   The patient has had a H&P performed within the last 30 days, all history, medications, and exam have been reviewed. The patient denies any interval changes since the H&P.  Medications: Prior to Admission medications   Medication Sig Start Date End Date Taking? Authorizing Provider  amLODipine (NORVASC) 10 MG tablet Take 1 tablet (10 mg total) by mouth daily. 09/12/14  Yes Cherene Altes, MD  carvedilol (COREG) 6.25 MG tablet Take 1 tablet (6.25 mg total) by mouth 2 (two) times daily with a meal. 09/12/14  Yes Cherene Altes, MD  morphine (MSIR) 15 MG tablet Take 1 tablet (15 mg total) by mouth every 6 (six) hours as needed. 10/01/14  Yes Heath Lark, MD  polyethylene glycol (MIRALAX) packet Take 17 g by mouth daily. 10/01/14  Yes Heath Lark, MD  triamcinolone cream (KENALOG) 0.1 % Apply 1 application topically 4 (four) times daily as needed. 08/21/14  Yes Merrily Pew, MD  EPINEPHrine 0.3 mg/0.3 mL IJ SOAJ injection Inject 0.3 mLs (0.3 mg total) into the muscle once. 09/12/14   Cherene Altes, MD  oxyCODONE (ROXICODONE) 5 MG immediate release tablet Take 0.5 tablets (2.5 mg total) by mouth every 4 (four) hours as needed for severe pain. Patient not taking: Reported on 10/02/2014 09/17/14   Deno Etienne, DO  polyethylene glycol powder [facility]) powder  10/01/14   Historical Provider, MD     Vital Signs: BP 154/90 mmHg  Pulse 81  Temp(Src) 98 F (36.7 C) (Oral)  Resp 18  Ht '5\' 1"'$  (1.549 m)  Wt 116 lb (52.617 kg)  BMI 21.93 kg/m2  SpO2 100%  Physical Exam  Constitutional: She is oriented to person, place, and time. No distress.  HENT:  Head: Normocephalic and atraumatic.  Neck:  Right [place]  LN region scar present   Cardiovascular: Normal rate and regular rhythm.  Exam reveals no gallop and no friction rub.   No murmur heard. Pulmonary/Chest: Effort normal and breath sounds normal. No respiratory distress. She has no wheezes. She has no rales.  Neurological: She is alert and oriented to person, place, and time.  Skin: Skin is warm and dry. She is not diaphoretic.    Mallampati Score:  MD Evaluation Airway: WNL Heart: WNL Abdomen: WNL Chest/ Lungs: WNL ASA  Classification: 3 Mallampati/Airway Score: Two  Labs:  CBC:  Recent Labs  09/08/14 0631 09/10/14 0945 09/12/14 0450 10/08/14 1210  WBC 11.6* 9.3 8.3 6.7  HGB 13.0 14.1 13.5 11.3*  HCT 39.4 41.2 40.1 33.9*  PLT 209 207 236 476*    COAGS:  Recent Labs  09/11/14 0930 10/08/14 1210  INR 0.98 0.97  APTT 39* 27    BMP:  Recent Labs  09/10/14 0945 09/11/14 0251 09/12/14 0450 10/08/14 1210  NA 137 134* 135 141  K 3.7 3.5 3.8 3.7  CL 102 98* 97* 106  CO2 '26 29 28 26  '$ GLUCOSE 145* 129* 136* 99  BUN 16 14 21* 12  CALCIUM 8.9 8.2* 8.6* 8.9  CREATININE 0.86 0.78 0.88 0.96  GFRNONAA >60 >60 >60 >60  GFRAA >60 >60 >60 >60    LIVER FUNCTION TESTS:  Recent Labs  09/05/14 1805 09/06/14 0612 09/10/14 0945 09/12/14 0450  BILITOT 0.6  0.4 0.7 0.7  AST 49* 43* 47* 41  ALT 27 27 46 59*  ALKPHOS 175* 173* 154* 135*  PROT 6.5 6.3* 5.9* 5.8*  ALBUMIN 3.2* 3.0* 3.2* 3.1*    Assessment/Plan:  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 Alvy Bimler and Dr. Isidore Moos Scheduled today for image guided port a catheter placement with sedation for chemotherapy  The patient has been NPO, no blood thinners taken, labs and vitals have been reviewed. Risks and Benefits discussed with the patient including, but not limited to bleeding, infection, pneumothorax, or fibrin sheath development  and need for additional procedures. All of the patient's questions were answered, patient is agreeable to proceed. Consent signed and in chart.    SignedHedy Jacob 10/08/2014, 1:17 PM

## 2014-10-12 ENCOUNTER — Encounter: Payer: Self-pay | Admitting: Hematology and Oncology

## 2014-10-12 ENCOUNTER — Telehealth: Payer: Self-pay | Admitting: Hematology and Oncology

## 2014-10-12 ENCOUNTER — Telehealth: Payer: Self-pay | Admitting: *Deleted

## 2014-10-12 ENCOUNTER — Ambulatory Visit (HOSPITAL_BASED_OUTPATIENT_CLINIC_OR_DEPARTMENT_OTHER): Payer: Medicaid Other | Admitting: Hematology and Oncology

## 2014-10-12 ENCOUNTER — Other Ambulatory Visit (HOSPITAL_BASED_OUTPATIENT_CLINIC_OR_DEPARTMENT_OTHER): Payer: Medicaid Other

## 2014-10-12 ENCOUNTER — Other Ambulatory Visit: Payer: Medicaid Other

## 2014-10-12 VITALS — BP 178/91 | HR 99 | Temp 98.2°F | Resp 18 | Ht 61.0 in | Wt 117.8 lb

## 2014-10-12 DIAGNOSIS — Z510 Encounter for antineoplastic radiation therapy: Secondary | ICD-10-CM | POA: Diagnosis not present

## 2014-10-12 DIAGNOSIS — M542 Cervicalgia: Secondary | ICD-10-CM

## 2014-10-12 DIAGNOSIS — R634 Abnormal weight loss: Secondary | ICD-10-CM

## 2014-10-12 DIAGNOSIS — C3492 Malignant neoplasm of unspecified part of left bronchus or lung: Principal | ICD-10-CM

## 2014-10-12 DIAGNOSIS — R1310 Dysphagia, unspecified: Secondary | ICD-10-CM | POA: Diagnosis not present

## 2014-10-12 DIAGNOSIS — I10 Essential (primary) hypertension: Secondary | ICD-10-CM

## 2014-10-12 DIAGNOSIS — C3491 Malignant neoplasm of unspecified part of right bronchus or lung: Secondary | ICD-10-CM | POA: Diagnosis not present

## 2014-10-12 DIAGNOSIS — C3490 Malignant neoplasm of unspecified part of unspecified bronchus or lung: Secondary | ICD-10-CM

## 2014-10-12 DIAGNOSIS — G8929 Other chronic pain: Secondary | ICD-10-CM | POA: Diagnosis not present

## 2014-10-12 LAB — CBC WITH DIFFERENTIAL/PLATELET
BASO%: 0.3 % (ref 0.0–2.0)
Basophils Absolute: 0 10*3/uL (ref 0.0–0.1)
EOS%: 0.3 % (ref 0.0–7.0)
Eosinophils Absolute: 0 10*3/uL (ref 0.0–0.5)
HEMATOCRIT: 36.9 % (ref 34.8–46.6)
HEMOGLOBIN: 12 g/dL (ref 11.6–15.9)
LYMPH#: 1.7 10*3/uL (ref 0.9–3.3)
LYMPH%: 15 % (ref 14.0–49.7)
MCH: 29.8 pg (ref 25.1–34.0)
MCHC: 32.6 g/dL (ref 31.5–36.0)
MCV: 91.2 fL (ref 79.5–101.0)
MONO#: 1.5 10*3/uL — AB (ref 0.1–0.9)
MONO%: 13.9 % (ref 0.0–14.0)
NEUT%: 70.5 % (ref 38.4–76.8)
NEUTROS ABS: 7.8 10*3/uL — AB (ref 1.5–6.5)
PLATELETS: 381 10*3/uL (ref 145–400)
RBC: 4.04 10*6/uL (ref 3.70–5.45)
RDW: 17 % — AB (ref 11.2–14.5)
WBC: 11.1 10*3/uL — ABNORMAL HIGH (ref 3.9–10.3)

## 2014-10-12 LAB — COMPREHENSIVE METABOLIC PANEL (CC13)
ALBUMIN: 3.2 g/dL — AB (ref 3.5–5.0)
ALT: 9 U/L (ref 0–55)
AST: 17 U/L (ref 5–34)
Alkaline Phosphatase: 149 U/L (ref 40–150)
Anion Gap: 7 mEq/L (ref 3–11)
BILIRUBIN TOTAL: 0.25 mg/dL (ref 0.20–1.20)
BUN: 12.6 mg/dL (ref 7.0–26.0)
CALCIUM: 9.1 mg/dL (ref 8.4–10.4)
CHLORIDE: 108 meq/L (ref 98–109)
CO2: 26 mEq/L (ref 22–29)
CREATININE: 0.9 mg/dL (ref 0.6–1.1)
EGFR: 87 mL/min/{1.73_m2} — ABNORMAL LOW (ref >90)
Glucose: 117 mg/dl (ref 70–140)
Potassium: 4 mEq/L (ref 3.5–5.1)
Sodium: 141 mEq/L (ref 136–145)
TOTAL PROTEIN: 6.8 g/dL (ref 6.4–8.3)

## 2014-10-12 MED ORDER — LIDOCAINE-PRILOCAINE 2.5-2.5 % EX CREA: TOPICAL_CREAM | CUTANEOUS | Status: DC

## 2014-10-12 MED ORDER — PROCHLORPERAZINE MALEATE 10 MG PO TABS: 10.0000 mg | ORAL_TABLET | Freq: Four times a day (QID) | ORAL | Status: DC | PRN

## 2014-10-12 MED ORDER — ONDANSETRON HCL 8 MG PO TABS: 8.0000 mg | ORAL_TABLET | Freq: Three times a day (TID) | ORAL | Status: DC | PRN

## 2014-10-12 MED ORDER — LISINOPRIL 20 MG PO TABS: 20.0000 mg | ORAL_TABLET | Freq: Every day | ORAL | Status: DC

## 2014-10-12 NOTE — Telephone Encounter (Signed)
Per staff message and POF I have scheduled appts. Advised scheduler of appts. JMW  

## 2014-10-12 NOTE — Telephone Encounter (Signed)
perp of to sch pt appt-sent MW ermail to sch trmt-gave pt updated copy of sch

## 2014-10-12 NOTE — Assessment & Plan Note (Signed)
We discussed the role of chemotherapy. The intent is for cure.  We discussed some of the risks, benefits, side-effects of Paclitaxel/carboplatin. Some of the short term side-effects included, though not limited to, including weight loss, life threatening infections, risk of allergic reactions, need for transfusions of blood products, nausea, vomiting, change in bowel habits, loss of hair, admission to hospital for various reasons, and risks of death.   Long term side-effects are also discussed including risks of infertility, permanent damage to nerve function, hearing loss, chronic fatigue, kidney damage with possibility needing hemodialysis, and rare secondary malignancy including bone marrow disorders.  The patient is aware that the response rates discussed earlier is not guaranteed.  After a long discussion, patient made an informed decision to proceed with the prescribed plan of care I will order blood work to be done on a weekly basis before each dose and see her every week for supportive care

## 2014-10-12 NOTE — Assessment & Plan Note (Signed)
The patient is not consistent with the pain medicine. We have discussed in the past about narcotic refill policy. She felt that I are morphine was too strong and preferred to take oxycodone. I reviewed the medication list with her. The prescribed dose of oxycodone is too low and after much discussion she is willing to try IR morphine again. I'll see her back next week for further assessment of pain control

## 2014-10-12 NOTE — Progress Notes (Signed)
[facility] OFFICE PROGRESS NOTE  Patient Care Team: Rosita Fire, MD as PCP - General (Internal Medicine) Heath Lark, MD as Consulting Physician (Hematology and Oncology) Jodi Marble, MD as Consulting Physician (Otolaryngology) Eppie Gibson, MD as Attending Physician (Radiation Oncology)  SUMMARY OF ONCOLOGIC HISTORY: Oncology History   Bilateral lung cancer   Staging form: Lung, AJCC 6th Edition     Clinical stage from 10/01/2014: Stage IIIB (T4(m), N3, M0) - Signed by Heath Lark, MD on 10/01/2014 Cancer of upper lobe of right lung   Staging form: Lung, AJCC 7th Edition     Clinical: Stage Unknown (TX, N3, M0) - Signed by Eppie Gibson, MD on 10/07/2014       Bilateral lung cancer   09/05/2014 - 09/12/2014 Hospital Admission The patient was admitted to the hospital for further management of possible skin reaction, dysphagia and supraclavicular lymphadenopathy   09/06/2014 Imaging CT scan of the neck showed malignant appearing superior mediastinal and right thoracic inlet lymphadenopathy   09/10/2014 Imaging CT scan of the abdomen and pelvis confirmed right supraclavicular and mediastinal lymphadenopathy, as described above but no other evidence of metastatic cancer    09/14/2014 Procedure Flexible endoscopy in the ENT office failed to reveal any primary in the head and neck region   09/16/2014 Imaging She failed barium swallow. 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 She returns for further follow-up. Dysphagia is resolving. She is able to tolerate 3-4 cans of Ensure Plus day. She continues to have pain and was taking some leftover oxycodone from prior prescription. She felt that IR morphine and Coreg caused nausea. She denies constipation  REVIEW OF SYSTEMS:   Constitutional: Denies fevers, chills or abnormal weight loss Eyes: Denies blurriness of vision Ears, nose, mouth, throat, and face: Denies mucositis or sore throat Respiratory: Denies cough, dyspnea or wheezes Cardiovascular: Denies palpitation, chest discomfort or lower extremity swelling Skin: Denies abnormal skin rashes Lymphatics: Denies new lymphadenopathy or easy bruising Neurological:Denies numbness, tingling or new weaknesses Behavioral/Psych: Mood is stable, no new changes  All other systems were reviewed with the patient and are negative.  I have reviewed the past medical history, past surgical history, social history and family history with the patient and they are unchanged from previous note.  ALLERGIES:  is allergic to hydrocodone-acetaminophen.  MEDICATIONS:  Current Outpatient Prescriptions  Medication Sig Dispense Refill  . amLODipine (NORVASC) 10 MG tablet Take 1 tablet (10 mg total) by mouth daily. 30 tablet 0  . carvedilol (COREG) 6.25 MG tablet Take 1 tablet (6.25 mg total) by mouth 2 (two) times daily with a meal. 60 tablet 0  . EPINEPHrine 0.3 mg/0.3 mL IJ SOAJ injection Inject 0.3 mLs (0.3 mg total) into the muscle once. 1 Device 0  . oxyCODONE (ROXICODONE) 5 MG immediate release tablet Take 0.5 tablets (2.5 mg total) by mouth every 4 (four) hours as needed for severe pain. 15 tablet 0  . polyethylene glycol (MIRALAX) packet Take 17 g by mouth daily. [place]  each 0  . triamcinolone cream (KENALOG) 0.1 % Apply 1 application topically 4 (four) times daily as needed. 30 g 1  . lidocaine-prilocaine (EMLA) cream Apply  to affected area once 30 g 3  . lisinopril (PRINIVIL,ZESTRIL) 20 MG tablet Take 1 tablet (20 mg total) by mouth daily. 30 tablet 6  . morphine (MSIR) 15 MG tablet Take 1 tablet (15 mg total) by mouth every 6 (six) hours as needed. (Patient not taking: Reported on 10/12/2014) 60 tablet 0  . ondansetron (ZOFRAN) 8 MG tablet Take 1 tablet (8 mg total) by mouth every 8 (eight) hours as needed. 60 tablet 1  . prochlorperazine (COMPAZINE) 10 MG tablet Take 1 tablet (10 mg total) by mouth every 6 (six) hours as needed (Nausea or vomiting). 60 tablet 1   No current facility-administered medications for this visit.    PHYSICAL EXAMINATION: ECOG PERFORMANCE STATUS: 1 - Symptomatic but completely ambulatory  Filed Vitals:   10/12/14 1142  BP: 178/91  Pulse: 99  Temp: 98.2 F (36.8 C)  Resp: 18   Filed Weights   10/12/14 1142  Weight: 117 lb 12.8 oz (53.434 kg)    GENERAL:alert, no distress and comfortable SKIN: skin color, texture, turgor are normal, no rashes or significant lesions EYES: normal, Conjunctiva are pink and non-injected, sclera clear Musculoskeletal:no cyanosis of digits and no clubbing. Well-healed surgical scar from recent port placement NEURO: alert & oriented x 3 with fluent speech, no focal motor/sensory deficits  LABORATORY DATA:  I have reviewed the data as listed    Component Value Date/Time   NA 141 10/12/2014 0941   NA 141 10/08/2014 1210   K 4.0 10/12/2014 0941   K 3.7 10/08/2014 1210   CL 106 10/08/2014 1210   CO2 26 10/12/2014 0941   CO2 26 10/08/2014 1210   GLUCOSE 117 10/12/2014 0941   GLUCOSE 99 10/08/2014 1210   BUN 12.6 10/12/2014 0941   BUN 12 10/08/2014 1210   CREATININE 0.9 10/12/2014 0941   CREATININE 0.96 10/08/2014 1210   CALCIUM 9.1 10/12/2014 0941   CALCIUM 8.9 10/08/2014 1210   PROT 6.8 10/12/2014 0941   PROT 5.8* 09/12/2014 0450   ALBUMIN 3.2* 10/12/2014 0941   ALBUMIN 3.1* 09/12/2014 0450   AST 17 10/12/2014 0941   AST 41  09/12/2014 0450   ALT 9 10/12/2014 0941   ALT 59* 09/12/2014 0450   ALKPHOS 149 10/12/2014 0941   ALKPHOS 135* 09/12/2014 0450   BILITOT 0.25 10/12/2014 0941   BILITOT 0.7 09/12/2014 0450   GFRNONAA >60 10/08/2014 1210   GFRAA >60 10/08/2014 1210    No results found for: SPEP, UPEP  Lab Results  Component Value Date   WBC 11.1* 10/12/2014   NEUTROABS 7.8* 10/12/2014   HGB 12.0 10/12/2014   HCT 36.9 10/12/2014   MCV 91.2 10/12/2014   PLT 381 10/12/2014      Chemistry      Component Value Date/Time   NA 141 10/12/2014 0941   NA 141 10/08/2014 1210   K 4.0 10/12/2014 0941   K 3.7 10/08/2014 1210   CL 106 10/08/2014 1210   CO2 26 10/12/2014 0941   CO2 26 10/08/2014 1210   BUN 12.6 10/12/2014 0941   BUN 12 10/08/2014 1210   CREATININE 0.9 10/12/2014 0941   CREATININE 0.96 10/08/2014 1210      Component Value Date/Time   CALCIUM 9.1 10/12/2014 0941   CALCIUM 8.9 10/08/2014 1210   ALKPHOS 149 10/12/2014 0941   ALKPHOS  135* 09/12/2014 0450   AST 17 10/12/2014 0941   AST 41 09/12/2014 0450   ALT 9 10/12/2014 0941   ALT 59* 09/12/2014 0450   BILITOT 0.25 10/12/2014 0941   BILITOT 0.7 09/12/2014 0450     ASSESSMENT & PLAN:  Bilateral lung cancer We discussed the role of chemotherapy. The intent is for cure.  We discussed some of the risks, benefits, side-effects of Paclitaxel/carboplatin. Some of the short term side-effects included, though not limited to, including weight loss, life threatening infections, risk of allergic reactions, need for transfusions of blood products, nausea, vomiting, change in bowel habits, loss of hair, admission to hospital for various reasons, and risks of death.   Long term side-effects are also discussed including risks of infertility, permanent damage to nerve function, hearing loss, chronic fatigue, kidney damage with possibility needing hemodialysis, and rare secondary malignancy including bone marrow disorders.  The patient is aware  that the response rates discussed earlier is not guaranteed.  After a long discussion, patient made an informed decision to proceed with the prescribed plan of care I will order blood work to be done on a weekly basis before each dose and see her every week for supportive care   Chronic neck pain The patient is not consistent with the pain medicine. We have discussed in the past about narcotic refill policy. She felt that I are morphine was too strong and preferred to take oxycodone. I reviewed the medication list with her. The prescribed dose of oxycodone is too low and after much discussion she is willing to try IR morphine again. I'll see her back next week for further assessment of pain control  Dysphagia The cause of dysphagia is unknown. There is no mechanical obstruction that could explain the cause of her difficulties with swallowing. She did have thrush which was adequately treated with fluconazole and has improved with swallowing I will arrange for her to see speech and language therapist for formal swallow evaluation. I recommend frequent small meals. She has gained some weight.    Essential hypertension Her blood pressure is poorly controlled. She felt that Coreg caused severe nausea. I recommend she take amlodipine in the morning and will start her on lisinopril in the evening  Excessive weight loss She has significant weight loss due to difficulties with swallowing and loss of appetite. I recommend frequent small meals and recommend dietitian evaluation     No orders of the defined types were placed in this encounter.   All questions were answered. The patient knows to call the clinic with any problems, questions or concerns. No barriers to learning was detected. I spent 30 minutes counseling the patient face to face. The total time spent in the appointment was 40 minutes and more than 50% was on counseling and review of test results     [facility], [place],  MD 10/12/2014 2:31 PM

## 2014-10-12 NOTE — Assessment & Plan Note (Signed)
Her blood pressure is poorly controlled. She felt that Coreg caused severe nausea. I recommend she take amlodipine in the morning and will start her on lisinopril in the evening

## 2014-10-12 NOTE — Assessment & Plan Note (Signed)
The cause of dysphagia is unknown. There is no mechanical obstruction that could explain the cause of her difficulties with swallowing. She did have thrush which was adequately treated with fluconazole and has improved with swallowing I will arrange for her to see speech and language therapist for formal swallow evaluation. I recommend frequent small meals. She has gained some weight.

## 2014-10-12 NOTE — Assessment & Plan Note (Signed)
She has significant weight loss due to difficulties with swallowing and loss of appetite. I recommend frequent small meals and recommend dietitian evaluation

## 2014-10-13 ENCOUNTER — Ambulatory Visit (HOSPITAL_COMMUNITY)
Admission: RE | Admit: 2014-10-13 | Discharge: 2014-10-13 | Disposition: A | Discharge status: Home / Self Care | Payer: Medicaid Other | Source: Ambulatory Visit | Attending: Radiation Oncology | Admitting: Radiation Oncology

## 2014-10-13 DIAGNOSIS — F17210 Nicotine dependence, cigarettes, uncomplicated: Secondary | ICD-10-CM | POA: Insufficient documentation

## 2014-10-13 DIAGNOSIS — R0609 Other forms of dyspnea: Secondary | ICD-10-CM | POA: Insufficient documentation

## 2014-10-13 DIAGNOSIS — C3411 Malignant neoplasm of upper lobe, right bronchus or lung: Secondary | ICD-10-CM | POA: Insufficient documentation

## 2014-10-13 LAB — PULMONARY FUNCTION TEST
DL/VA % pred: 65 %
DL/VA: 2.9 ml/min/mmHg/L
DLCO COR % PRED: 47 %
DLCO UNC: 9.1 ml/min/mmHg
DLCO cor: 9.54 ml/min/mmHg
DLCO unc % pred: 45 %
FEF 25-75 POST: 0.84 L/s
FEF 25-75 Pre: 0.83 L/sec
FEF2575-%CHANGE-POST: 1 %
FEF2575-%PRED-POST: 44 %
FEF2575-%Pred-Pre: 44 %
FEV1-%CHANGE-POST: 2 %
FEV1-%PRED-POST: 77 %
FEV1-%Pred-Pre: 75 %
FEV1-Post: 1.42 L
FEV1-Pre: 1.39 L
FEV1FVC-%CHANGE-POST: 7 %
FEV1FVC-%PRED-PRE: 86 %
FEV6-%Change-Post: -4 %
FEV6-%Pred-Post: 86 %
FEV6-%Pred-Pre: 90 %
FEV6-Post: 1.94 L
FEV6-Pre: 2.03 L
FEV6FVC-%CHANGE-POST: 0 %
FEV6FVC-%Pred-Post: 104 %
FEV6FVC-%Pred-Pre: 103 %
FVC-%CHANGE-POST: -4 %
FVC-%PRED-PRE: 87 %
FVC-%Pred-Post: 83 %
FVC-POST: 1.94 L
FVC-PRE: 2.04 L
POST FEV1/FVC RATIO: 73 %
PRE FEV6/FVC RATIO: 99 %
Post FEV6/FVC ratio: 100 %
Pre FEV1/FVC ratio: 68 %
RV % pred: 99 %
RV: 1.82 L
TLC % pred: 81 %
TLC: 3.74 L

## 2014-10-13 MED ORDER — ALBUTEROL SULFATE (2.5 MG/3ML) 0.083% IN NEBU
2.5000 mg | INHALATION_SOLUTION | Freq: Once | RESPIRATORY_TRACT | Status: AC
  Administered 2014-10-13: 2.5 mg via RESPIRATORY_TRACT

## 2014-10-14 ENCOUNTER — Ambulatory Visit: Payer: Medicaid Other | Admitting: Nutrition

## 2014-10-14 ENCOUNTER — Ambulatory Visit (HOSPITAL_BASED_OUTPATIENT_CLINIC_OR_DEPARTMENT_OTHER): Payer: Medicaid Other

## 2014-10-14 ENCOUNTER — Ambulatory Visit
Admission: RE | Admit: 2014-10-14 | Discharge: 2014-10-14 | Disposition: A | Discharge status: Home / Self Care | Payer: Medicaid Other | Source: Ambulatory Visit | Attending: Radiation Oncology | Admitting: Radiation Oncology

## 2014-10-14 ENCOUNTER — Encounter: Payer: Self-pay | Admitting: *Deleted

## 2014-10-14 VITALS — BP 121/68 | HR 80 | Temp 98.5°F | Resp 18

## 2014-10-14 DIAGNOSIS — Z5111 Encounter for antineoplastic chemotherapy: Secondary | ICD-10-CM

## 2014-10-14 DIAGNOSIS — C3491 Malignant neoplasm of unspecified part of right bronchus or lung: Secondary | ICD-10-CM | POA: Diagnosis not present

## 2014-10-14 DIAGNOSIS — Z510 Encounter for antineoplastic radiation therapy: Secondary | ICD-10-CM | POA: Diagnosis not present

## 2014-10-14 DIAGNOSIS — C3492 Malignant neoplasm of unspecified part of left bronchus or lung: Principal | ICD-10-CM

## 2014-10-14 MED ORDER — DIPHENHYDRAMINE HCL 50 MG/ML IJ SOLN
INTRAMUSCULAR | Status: AC
Start: 2014-10-14 — End: 2014-10-14
  Filled 2014-10-14: qty 1

## 2014-10-14 MED ORDER — DIPHENHYDRAMINE HCL 50 MG/ML IJ SOLN
50.0000 mg | Freq: Once | INTRAMUSCULAR | Status: AC
  Administered 2014-10-14: 50 mg via INTRAVENOUS

## 2014-10-14 MED ORDER — PACLITAXEL CHEMO INJECTION 300 MG/50ML
45.0000 mg/m2 | Freq: Once | INTRAVENOUS | Status: AC
  Administered 2014-10-14: 66 mg via INTRAVENOUS
  Filled 2014-10-14: qty 11

## 2014-10-14 MED ORDER — FAMOTIDINE IN NACL 20-0.9 MG/50ML-% IV SOLN
INTRAVENOUS | Status: AC
  Filled 2014-10-14: qty 50

## 2014-10-14 MED ORDER — SODIUM CHLORIDE 0.9 % IV SOLN
162.8000 mg | Freq: Once | INTRAVENOUS | Status: AC
  Administered 2014-10-14: 160 mg via INTRAVENOUS
  Filled 2014-10-14: qty 16

## 2014-10-14 MED ORDER — SODIUM CHLORIDE 0.9 % IV SOLN
Freq: Once | INTRAVENOUS | Status: AC
  Administered 2014-10-14: 12:00:00 via INTRAVENOUS
  Filled 2014-10-14: qty 8

## 2014-10-14 MED ORDER — SODIUM CHLORIDE 0.9 % IJ SOLN
10.0000 mL | INTRAMUSCULAR | Status: DC | PRN
  Administered 2014-10-14: 10 mL
  Filled 2014-10-14: qty 10

## 2014-10-14 MED ORDER — FAMOTIDINE IN NACL 20-0.9 MG/50ML-% IV SOLN
20.0000 mg | Freq: Once | INTRAVENOUS | Status: AC
Start: 2014-10-14 — End: 2014-10-14
  Administered 2014-10-14: 20 mg via INTRAVENOUS

## 2014-10-14 MED ORDER — HEPARIN SOD (PORK) LOCK FLUSH 100 UNIT/ML IV SOLN
500.0000 [IU] | Freq: Once | INTRAVENOUS | Status: AC | PRN
  Administered 2014-10-14: 500 [IU]
  Filled 2014-10-14: qty 5

## 2014-10-14 MED ORDER — SODIUM CHLORIDE 0.9 % IV SOLN
Freq: Once | INTRAVENOUS | Status: AC
  Administered 2014-10-14: 12:00:00 via INTRAVENOUS

## 2014-10-14 NOTE — Progress Notes (Signed)
Oncology Nurse Navigator Documentation  Oncology Nurse Navigator Flowsheets 10/14/2014  Navigator Encounter Type Initial MedOnc/spoke with patient today in chemo room.  She is doing well.  She is supposed to be set up for dermatology appt. And not sure about that appt.  I will follow up and let her know.   Patient Visit Type Initial  Treatment Phase Treatment  Interventions Coordination of Care  Coordination of Care Other  Time Spent with Patient 15

## 2014-10-14 NOTE — Progress Notes (Signed)
Patient has dermatology referral in [facility]

## 2014-10-14 NOTE — Patient Instructions (Signed)
[facility] Discharge Instructions for Patients Receiving Chemotherapy  Today you received the following chemotherapy agents Paclitaxel/Carboplatin.   To help prevent nausea and vomiting after your treatment, we encourage you to take your nausea medication as directed.    If you develop nausea and vomiting that is not controlled by your nausea medication, call the clinic.   BELOW ARE SYMPTOMS THAT SHOULD BE REPORTED IMMEDIATELY:  *FEVER GREATER THAN 100.5 F  *CHILLS WITH OR WITHOUT FEVER  NAUSEA AND VOMITING THAT IS NOT CONTROLLED WITH YOUR NAUSEA MEDICATION  *UNUSUAL SHORTNESS OF BREATH  *UNUSUAL BRUISING OR BLEEDING  TENDERNESS IN MOUTH AND THROAT WITH OR WITHOUT PRESENCE OF ULCERS  *URINARY PROBLEMS  *BOWEL PROBLEMS  UNUSUAL RASH Items with * indicate a potential emergency and should be followed up as soon as possible.  Feel free to call the clinic you have any questions or concerns. The clinic phone number is [phone].  Please show the [facility] at check-in to the Emergency Department and triage nurse.

## 2014-10-14 NOTE — Progress Notes (Signed)
Nutrition follow-up completed with patient and family.  Patient now diagnosed with a lung cancer.  She will receive chemotherapy and radiation therapy. Patient reports dysphagia has improved. Consumes between 2 and 4 Ensure Plus a day. She typically eats 2 meals daily. Pain continues at site of biopsy. Weight is stable at 119.2 pounds September 21.  Nutrition diagnosis: Unintended weight loss improved.  Intervention: Educated patient to continue small frequent meals and snacks utilizing soft moist foods. Recommended patient increase oral nutrition supplements to a minimum 3 times a day Provided second complimentary case of Ensure Plus. Educated patient to be sure she is managing constipation. Questions were answered.  Teach back method used.  Monitoring, evaluation, goals: Patient will work to increase oral intake to promote weight maintenance.  Next visit: Wednesday, September 28, during infusion.  **Disclaimer: This note was dictated with voice recognition software. Similar sounding words can inadvertently be transcribed and this note may contain transcription errors which may not have been corrected upon publication of note.**

## 2014-10-15 ENCOUNTER — Telehealth: Payer: Self-pay | Admitting: *Deleted

## 2014-10-15 ENCOUNTER — Ambulatory Visit
Admission: RE | Admit: 2014-10-15 | Discharge: 2014-10-15 | Disposition: A | Discharge status: Home / Self Care | Payer: Medicaid Other | Source: Ambulatory Visit | Attending: Radiation Oncology | Admitting: Radiation Oncology

## 2014-10-15 DIAGNOSIS — Z510 Encounter for antineoplastic radiation therapy: Secondary | ICD-10-CM | POA: Diagnosis not present

## 2014-10-15 NOTE — Telephone Encounter (Signed)
Spoke with patient. Her 1st chemo treatment was yesterday. States she is eating and drinking well, no c/o's.

## 2014-10-16 ENCOUNTER — Ambulatory Visit
Admission: RE | Admit: 2014-10-16 | Discharge: 2014-10-16 | Disposition: A | Discharge status: Home / Self Care | Payer: Medicaid Other | Source: Ambulatory Visit | Attending: Radiation Oncology | Admitting: Radiation Oncology

## 2014-10-16 DIAGNOSIS — Z510 Encounter for antineoplastic radiation therapy: Secondary | ICD-10-CM | POA: Diagnosis not present

## 2014-10-19 ENCOUNTER — Ambulatory Visit
Admission: RE | Admit: 2014-10-19 | Discharge: 2014-10-19 | Disposition: A | Discharge status: Home / Self Care | Payer: Medicaid Other | Source: Ambulatory Visit | Attending: Radiation Oncology | Admitting: Radiation Oncology

## 2014-10-19 ENCOUNTER — Ambulatory Visit: Payer: Medicaid Other | Admitting: Radiation Oncology

## 2014-10-19 VITALS — BP 121/95 | HR 106 | Temp 98.5°F | Ht 61.0 in | Wt 119.9 lb

## 2014-10-19 DIAGNOSIS — C3492 Malignant neoplasm of unspecified part of left bronchus or lung: Secondary | ICD-10-CM | POA: Diagnosis not present

## 2014-10-19 DIAGNOSIS — C3491 Malignant neoplasm of unspecified part of right bronchus or lung: Secondary | ICD-10-CM | POA: Insufficient documentation

## 2014-10-19 DIAGNOSIS — Z510 Encounter for antineoplastic radiation therapy: Secondary | ICD-10-CM | POA: Diagnosis not present

## 2014-10-19 DIAGNOSIS — C3411 Malignant neoplasm of upper lobe, right bronchus or lung: Secondary | ICD-10-CM

## 2014-10-19 MED ORDER — BIAFINE EX EMUL
CUTANEOUS | Status: DC | PRN
  Administered 2014-10-19: 17:00:00 via TOPICAL

## 2014-10-19 NOTE — Progress Notes (Signed)
Pt here for patient teaching.  Pt given Radiation and You booklet, skin care instructions and Biafine. Pt reports they have watched the Radiation Therapy Education video on October 10, 2014.  Reviewed areas of pertinence such as fatigue, hair loss, skin changes, throat changes, cough and shortness of breath . Pt able to give teach back of to pat skin, use unscented/gentle soap and drink plenty of water,apply Biafine bid and avoid applying anything to skin within 4 hours of treatment. Pt needs reinforcement and verbalizes understanding of information given and will contact nursing with any questions or concerns.

## 2014-10-19 NOTE — Addendum Note (Signed)
Encounter addended by: Benn Moulder, RN on: 10/19/2014  5:25 PM<BR>     Documentation filed: [employer], Orders

## 2014-10-19 NOTE — Addendum Note (Signed)
Encounter addended by: Benn Moulder, RN on: 10/19/2014  5:30 PM<BR>     Documentation filed: Inpatient MAR

## 2014-10-19 NOTE — Progress Notes (Signed)
   Weekly Management Note:  Outpatient    ICD-9-CM ICD-10-CM   1. Cancer of upper lobe of right lung 162.3 C34.11     Current Dose:  6 Gy  Projected Dose: 60 Gy   Narrative:  The patient presents for routine under treatment assessment.  CBCT/MVCT images/Port film x-rays were reviewed.  The chart was checked. Megan Sims has received 3 fractions to her chest and Right supraclavicular region.  She reports tenderness in the right lower neck incision. Requests narcotic refill. Reports nausea when she lies on her stomach.  Started chemotherapy last week.   Physical Findings:  Wt Readings from Last 3 Encounters:  10/19/14 119 lb 14.4 oz (54.386 kg)  10/14/14 119 lb 3.2 oz (54.069 kg)  10/12/14 117 lb 12.8 oz (53.434 kg)    height is '5\' 1"'$  (1.549 m) and weight is 119 lb 14.4 oz (54.386 kg). Her temperature is 98.5 F (36.9 C). Her blood pressure is 121/95 and her pulse is 106.  NAD, sitting comfortably.  CBC    Component Value Date/Time   WBC 11.1* 10/12/2014 0941   WBC 6.7 10/08/2014 1210   RBC 4.04 10/12/2014 0941   RBC 3.75* 10/08/2014 1210   HGB 12.0 10/12/2014 0941   HGB 11.3* 10/08/2014 1210   HCT 36.9 10/12/2014 0941   HCT 33.9* 10/08/2014 1210   PLT 381 10/12/2014 0941   PLT 476* 10/08/2014 1210   MCV 91.2 10/12/2014 0941   MCV 90.4 10/08/2014 1210   MCH 29.8 10/12/2014 0941   MCH 30.1 10/08/2014 1210   MCHC 32.6 10/12/2014 0941   MCHC 33.3 10/08/2014 1210   RDW 17.0* 10/12/2014 0941   RDW 16.9* 10/08/2014 1210   LYMPHSABS 1.7 10/12/2014 0941   LYMPHSABS 0.8 09/10/2014 0945   MONOABS 1.5* 10/12/2014 0941   MONOABS 0.9 09/10/2014 0945   EOSABS 0.0 10/12/2014 0941   EOSABS 0.0 09/10/2014 0945   BASOSABS 0.0 10/12/2014 0941   BASOSABS 0.0 09/10/2014 0945     CMP     Component Value Date/Time   NA 141 10/12/2014 0941   NA 141 10/08/2014 1210   K 4.0 10/12/2014 0941   K 3.7 10/08/2014 1210   CL 106 10/08/2014 1210   CO2 26 10/12/2014 0941   CO2 26 10/08/2014  1210   GLUCOSE 117 10/12/2014 0941   GLUCOSE 99 10/08/2014 1210   BUN 12.6 10/12/2014 0941   BUN 12 10/08/2014 1210   CREATININE 0.9 10/12/2014 0941   CREATININE 0.96 10/08/2014 1210   CALCIUM 9.1 10/12/2014 0941   CALCIUM 8.9 10/08/2014 1210   PROT 6.8 10/12/2014 0941   PROT 5.8* 09/12/2014 0450   ALBUMIN 3.2* 10/12/2014 0941   ALBUMIN 3.1* 09/12/2014 0450   AST 17 10/12/2014 0941   AST 41 09/12/2014 0450   ALT 9 10/12/2014 0941   ALT 59* 09/12/2014 0450   ALKPHOS 149 10/12/2014 0941   ALKPHOS 135* 09/12/2014 0450   BILITOT 0.25 10/12/2014 0941   BILITOT 0.7 09/12/2014 0450   GFRNONAA >60 10/08/2014 1210   GFRAA >60 10/08/2014 1210     Impression:  The patient is tolerating radiotherapy.   Plan:  Continue radiotherapy as planned.  Defer to med/onc on narcotic management. She will discuss with Dr Alvy Bimler -----------------------------------  Megan Gibson, MD

## 2014-10-19 NOTE — Progress Notes (Signed)
Megan Sims has received 3 fractions to her chest and Right supraclavicular region.  She reports tenderness in the right lower neck incision. Reports nausea when she lies on her stomach.  Started chemotherapy last week.

## 2014-10-20 ENCOUNTER — Other Ambulatory Visit (HOSPITAL_BASED_OUTPATIENT_CLINIC_OR_DEPARTMENT_OTHER): Payer: Medicaid Other

## 2014-10-20 ENCOUNTER — Ambulatory Visit
Admission: RE | Admit: 2014-10-20 | Discharge: 2014-10-20 | Disposition: A | Discharge status: Home / Self Care | Payer: Medicaid Other | Source: Ambulatory Visit | Attending: Radiation Oncology | Admitting: Radiation Oncology

## 2014-10-20 DIAGNOSIS — C3491 Malignant neoplasm of unspecified part of right bronchus or lung: Secondary | ICD-10-CM | POA: Diagnosis not present

## 2014-10-20 DIAGNOSIS — C3492 Malignant neoplasm of unspecified part of left bronchus or lung: Principal | ICD-10-CM

## 2014-10-20 DIAGNOSIS — Z510 Encounter for antineoplastic radiation therapy: Secondary | ICD-10-CM | POA: Diagnosis not present

## 2014-10-20 LAB — CBC WITH DIFFERENTIAL/PLATELET
BASO%: 0.3 % (ref 0.0–2.0)
Basophils Absolute: 0 10*3/uL (ref 0.0–0.1)
EOS%: 0.9 % (ref 0.0–7.0)
Eosinophils Absolute: 0.1 10*3/uL (ref 0.0–0.5)
HCT: 35.5 % (ref 34.8–46.6)
HGB: 11.5 g/dL — ABNORMAL LOW (ref 11.6–15.9)
LYMPH#: 1.3 10*3/uL (ref 0.9–3.3)
LYMPH%: 15.5 % (ref 14.0–49.7)
MCH: 29.6 pg (ref 25.1–34.0)
MCHC: 32.4 g/dL (ref 31.5–36.0)
MCV: 91.3 fL (ref 79.5–101.0)
MONO#: 0.7 10*3/uL (ref 0.1–0.9)
MONO%: 7.9 % (ref 0.0–14.0)
NEUT#: 6.5 10*3/uL (ref 1.5–6.5)
NEUT%: 75.4 % (ref 38.4–76.8)
Platelets: 302 10*3/uL (ref 145–400)
RBC: 3.89 10*6/uL (ref 3.70–5.45)
RDW: 17.7 % — ABNORMAL HIGH (ref 11.2–14.5)
WBC: 8.7 10*3/uL (ref 3.9–10.3)
nRBC: 1 % — ABNORMAL HIGH (ref 0–0)

## 2014-10-20 LAB — COMPREHENSIVE METABOLIC PANEL (CC13)
ALT: 10 U/L (ref 0–55)
AST: 19 U/L (ref 5–34)
Albumin: 3.2 g/dL — ABNORMAL LOW (ref 3.5–5.0)
Alkaline Phosphatase: 145 U/L (ref 40–150)
Anion Gap: 7 mEq/L (ref 3–11)
BUN: 15.2 mg/dL (ref 7.0–26.0)
CHLORIDE: 106 meq/L (ref 98–109)
CO2: 27 mEq/L (ref 22–29)
Calcium: 9.3 mg/dL (ref 8.4–10.4)
Creatinine: 0.8 mg/dL (ref 0.6–1.1)
GLUCOSE: 94 mg/dL (ref 70–140)
POTASSIUM: 4.1 meq/L (ref 3.5–5.1)
SODIUM: 140 meq/L (ref 136–145)
Total Bilirubin: <0.3 mg/dL (ref 0.20–1.20)
Total Protein: 6.6 g/dL (ref 6.4–8.3)

## 2014-10-20 NOTE — Addendum Note (Signed)
Encounter addended by: Benn Moulder, RN on: 10/20/2014  3:46 PM<BR>     Documentation filed: Charges VN

## 2014-10-21 ENCOUNTER — Encounter: Payer: Self-pay | Admitting: Hematology and Oncology

## 2014-10-21 ENCOUNTER — Ambulatory Visit (HOSPITAL_BASED_OUTPATIENT_CLINIC_OR_DEPARTMENT_OTHER): Payer: Medicaid Other | Admitting: Hematology and Oncology

## 2014-10-21 ENCOUNTER — Ambulatory Visit
Admission: RE | Admit: 2014-10-21 | Discharge: 2014-10-21 | Disposition: A | Discharge status: Home / Self Care | Payer: Medicaid Other | Source: Ambulatory Visit | Attending: Radiation Oncology | Admitting: Radiation Oncology

## 2014-10-21 ENCOUNTER — Ambulatory Visit (HOSPITAL_BASED_OUTPATIENT_CLINIC_OR_DEPARTMENT_OTHER): Payer: Medicaid Other

## 2014-10-21 ENCOUNTER — Ambulatory Visit: Payer: Medicaid Other | Admitting: Nutrition

## 2014-10-21 ENCOUNTER — Encounter: Payer: Self-pay | Admitting: *Deleted

## 2014-10-21 VITALS — BP 154/84 | HR 106 | Temp 98.7°F | Resp 18 | Ht 61.0 in | Wt 119.4 lb

## 2014-10-21 DIAGNOSIS — D6481 Anemia due to antineoplastic chemotherapy: Secondary | ICD-10-CM | POA: Diagnosis not present

## 2014-10-21 DIAGNOSIS — C3491 Malignant neoplasm of unspecified part of right bronchus or lung: Secondary | ICD-10-CM

## 2014-10-21 DIAGNOSIS — C3492 Malignant neoplasm of unspecified part of left bronchus or lung: Principal | ICD-10-CM

## 2014-10-21 DIAGNOSIS — R634 Abnormal weight loss: Secondary | ICD-10-CM | POA: Diagnosis not present

## 2014-10-21 DIAGNOSIS — G8929 Other chronic pain: Secondary | ICD-10-CM

## 2014-10-21 DIAGNOSIS — Z510 Encounter for antineoplastic radiation therapy: Secondary | ICD-10-CM | POA: Diagnosis not present

## 2014-10-21 DIAGNOSIS — Z5111 Encounter for antineoplastic chemotherapy: Secondary | ICD-10-CM

## 2014-10-21 DIAGNOSIS — M542 Cervicalgia: Secondary | ICD-10-CM | POA: Diagnosis not present

## 2014-10-21 DIAGNOSIS — T451X5A Adverse effect of antineoplastic and immunosuppressive drugs, initial encounter: Secondary | ICD-10-CM

## 2014-10-21 MED ORDER — SODIUM CHLORIDE 0.9 % IV SOLN
177.0000 mg | Freq: Once | INTRAVENOUS | Status: AC
  Administered 2014-10-21: 180 mg via INTRAVENOUS
  Filled 2014-10-21: qty 18

## 2014-10-21 MED ORDER — SODIUM CHLORIDE 0.9 % IV SOLN
Freq: Once | INTRAVENOUS | Status: AC
  Administered 2014-10-21: 12:00:00 via INTRAVENOUS

## 2014-10-21 MED ORDER — DIPHENHYDRAMINE HCL 50 MG/ML IJ SOLN
50.0000 mg | Freq: Once | INTRAMUSCULAR | Status: AC
  Administered 2014-10-21: 50 mg via INTRAVENOUS

## 2014-10-21 MED ORDER — SODIUM CHLORIDE 0.9 % IJ SOLN
10.0000 mL | INTRAMUSCULAR | Status: DC | PRN
  Administered 2014-10-21: 10 mL
  Filled 2014-10-21: qty 10

## 2014-10-21 MED ORDER — DEXAMETHASONE SODIUM PHOSPHATE 100 MG/10ML IJ SOLN
Freq: Once | INTRAMUSCULAR | Status: AC
  Administered 2014-10-21: 12:00:00 via INTRAVENOUS
  Filled 2014-10-21: qty 8

## 2014-10-21 MED ORDER — FAMOTIDINE IN NACL 20-0.9 MG/50ML-% IV SOLN
20.0000 mg | Freq: Once | INTRAVENOUS | Status: AC
Start: 2014-10-21 — End: 2014-10-21
  Administered 2014-10-21: 20 mg via INTRAVENOUS

## 2014-10-21 MED ORDER — HEPARIN SOD (PORK) LOCK FLUSH 100 UNIT/ML IV SOLN
500.0000 [IU] | Freq: Once | INTRAVENOUS | Status: AC | PRN
  Administered 2014-10-21: 500 [IU]
  Filled 2014-10-21: qty 5

## 2014-10-21 MED ORDER — DIPHENHYDRAMINE HCL 50 MG/ML IJ SOLN
INTRAMUSCULAR | Status: AC
  Filled 2014-10-21: qty 1

## 2014-10-21 MED ORDER — FAMOTIDINE IN NACL 20-0.9 MG/50ML-% IV SOLN
INTRAVENOUS | Status: AC
  Filled 2014-10-21: qty 50

## 2014-10-21 MED ORDER — PACLITAXEL CHEMO INJECTION 300 MG/50ML
45.0000 mg/m2 | Freq: Once | INTRAVENOUS | Status: AC
  Administered 2014-10-21: 66 mg via INTRAVENOUS
  Filled 2014-10-21: qty 11

## 2014-10-21 NOTE — Progress Notes (Signed)
Oncology Nurse Navigator Documentation  Oncology Nurse Navigator Flowsheets 10/21/2014  Navigator Encounter Type Other/spoke with patient today during her chemotherapy.  She is doing well and is without complaints.  She is being set up with dermatologist that takes medicare.  He rash is looking better.  She is using cocoa lotion and benadryl.  No barriers identified at this time.   Patient Visit Type Follow-up  Treatment Phase Treatment  Barriers/Navigation Needs No barriers at this time  Interventions -  Coordination of Care -  Time Spent with Patient 15

## 2014-10-21 NOTE — Assessment & Plan Note (Signed)
She has significant weight loss due to difficulties with swallowing and loss of appetite. Her appetite has improved now and I recommend her to continue on nutritional supplement. Her healthy weight is around 135 pounds.

## 2014-10-21 NOTE — Assessment & Plan Note (Addendum)
The patient is not consistent with the pain medicine. We have discussed in the past about narcotic refill policy. She felt that IR morphine was too strong and preferred to take oxycodone. I reviewed the medication list with her. The prescribed dose of oxycodone is too low and after much discussion she is willing to try IR morphine again. I prohibit her from getting pain medication refill from other physicians. I also recommended she does not take Percocet because it contained Tylenol.  I'll see her back next week for further assessment of pain control

## 2014-10-21 NOTE — Progress Notes (Signed)
Nutrition follow-up completed with patient being treated for lung cancer. Patient reports dysphasia continues to improve. She is trying to be more active. Consuming 3-4 bottles of Ensure Plus daily. Weight is stable and documented as 119.9 pounds on September 26. Patient denies any new nutrition impact symptoms.  Nutrition diagnosis:  Unintended weight loss improved.  Intervention:  Patient educated to continue strategies for increased calories and protein. Patient should continue oral nutrition supplements 3-4 times daily. Provided support and encouragement. Questions answered.  Teach back method used.  Monitoring, evaluation, goals: Patient will continue to consume increased calories and protein with oral nutrition supplements 3 times a day to 4 times a day for weight stabilization.  Next visit: Wednesday, October 19, during infusion.  **Disclaimer: This note was dictated with voice recognition software. Similar sounding words can inadvertently be transcribed and this note may contain transcription errors which may not have been corrected upon publication of note.**

## 2014-10-21 NOTE — Patient Instructions (Signed)
[facility] Discharge Instructions for Patients Receiving Chemotherapy  Today you received the following chemotherapy agents Paclitaxel/Carboplatin.   To help prevent nausea and vomiting after your treatment, we encourage you to take your nausea medication as directed.    If you develop nausea and vomiting that is not controlled by your nausea medication, call the clinic.   BELOW ARE SYMPTOMS THAT SHOULD BE REPORTED IMMEDIATELY:  *FEVER GREATER THAN 100.5 F  *CHILLS WITH OR WITHOUT FEVER  NAUSEA AND VOMITING THAT IS NOT CONTROLLED WITH YOUR NAUSEA MEDICATION  *UNUSUAL SHORTNESS OF BREATH  *UNUSUAL BRUISING OR BLEEDING  TENDERNESS IN MOUTH AND THROAT WITH OR WITHOUT PRESENCE OF ULCERS  *URINARY PROBLEMS  *BOWEL PROBLEMS  UNUSUAL RASH Items with * indicate a potential emergency and should be followed up as soon as possible.  Feel free to call the clinic you have any questions or concerns. The clinic phone number is [phone].  Please show the [facility] at check-in to the Emergency Department and triage nurse.

## 2014-10-21 NOTE — Progress Notes (Signed)
[place] OFFICE PROGRESS NOTE  Patient Care Team: Rosita Fire, MD as PCP - General (Internal Medicine) Heath Lark, MD as Consulting Physician (Hematology and Oncology) Jodi Marble, MD as Consulting Physician (Otolaryngology) Eppie Gibson, MD as Attending Physician (Radiation Oncology)  SUMMARY OF ONCOLOGIC HISTORY: Oncology History   Bilateral lung cancer   Staging form: Lung, AJCC 6th Edition     Clinical stage from 10/01/2014: Stage IIIB (T4(m), N3, M0) - Signed by Heath Lark, MD on 10/01/2014 Cancer of upper lobe of right lung   Staging form: Lung, AJCC 7th Edition     Clinical: Stage Unknown (TX, N3, M0) - Signed by Eppie Gibson, MD on 10/07/2014       Bilateral lung cancer   09/05/2014 - 09/12/2014 Hospital Admission The patient was admitted to the hospital for further management of possible skin reaction, dysphagia and supraclavicular lymphadenopathy   09/06/2014 Imaging CT scan of the neck showed malignant appearing superior mediastinal and right thoracic inlet lymphadenopathy   09/10/2014 Imaging CT scan of the abdomen and pelvis confirmed right supraclavicular and mediastinal lymphadenopathy, as described above but no other evidence of metastatic cancer    09/14/2014 Procedure Flexible endoscopy in the ENT office failed to reveal any primary in the head and neck region   09/16/2014 Imaging She failed barium swallow. 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 She is seen prior to cycle 2 of treatment. She tolerated cycle 1 well without any side effects. She continues to have chronic neck pain and have asked her ENT doctor for further pain medication. She denies swallowing difficulties. She is gaining weight.  REVIEW OF SYSTEMS:   Constitutional: Denies fevers, chills or abnormal weight loss Eyes: Denies blurriness of vision Ears, nose, mouth, throat, and face: Denies mucositis or sore throat Respiratory: Denies cough, dyspnea or wheezes Cardiovascular: Denies palpitation, chest discomfort or lower extremity swelling Gastrointestinal:  Denies nausea, heartburn or change in bowel habits Skin: Denies abnormal skin rashes Lymphatics: Denies new lymphadenopathy or easy bruising Neurological:Denies numbness, tingling or new weaknesses Behavioral/Psych: Mood is stable, no new changes  All other systems were reviewed with the patient and are negative.  I have reviewed the past medical history, past surgical history, social history and family history with the patient and they are unchanged from previous note.  ALLERGIES:  is allergic to hydrocodone-acetaminophen.  MEDICATIONS:  Current Outpatient Prescriptions  Medication Sig Dispense Refill  . amLODipine (NORVASC) 10 MG tablet Take 1 tablet (10 mg total) by mouth daily. 30 tablet 0  . carvedilol (COREG) 6.25 MG tablet Take 1 tablet (6.25 mg total) by mouth 2 (two) times daily with a meal. 60 tablet 0  . EPINEPHrine 0.3 mg/0.3 mL IJ SOAJ injection Inject 0.3 mLs (0.3 mg total) into the muscle once. 1 Device 0  . lidocaine-prilocaine (EMLA) cream Apply to affected area once 30 g  3  . lisinopril (PRINIVIL,ZESTRIL) 20 MG tablet Take 1 tablet (20 mg total) by mouth daily. 30 tablet 6  . ondansetron (ZOFRAN) 8 MG tablet Take 1 tablet (8  mg total) by mouth every 8 (eight) hours as needed. 60 tablet 1  . oxyCODONE (ROXICODONE) 5 MG immediate release tablet Take 0.5 tablets (2.5 mg total) by mouth every 4 (four) hours as needed for severe pain. 15 tablet 0  . polyethylene glycol (MIRALAX) packet Take 17 g by mouth daily. 14 each 0  . prochlorperazine (COMPAZINE) 10 MG tablet Take 1 tablet (10 mg total) by mouth every 6 (six) hours as needed (Nausea or vomiting). 60 tablet 1  . triamcinolone cream (KENALOG) 0.1 % Apply 1 application topically 4 (four) times daily as needed. 30 g 1  . morphine (MSIR) 15 MG tablet Take 1 tablet (15 mg total) by mouth every 6 (six) hours as needed. (Patient not taking: Reported on 10/12/2014) 60 tablet 0   No current facility-administered medications for this visit.   Facility-Administered Medications Ordered in Other Visits  Medication Dose Route Frequency Montoya Watkin Last Rate Last Dose  . CARBOplatin (PARAPLATIN) 180 mg in sodium chloride 0.9 % 100 mL chemo infusion  180 mg Intravenous Once Heath Lark, MD      . diphenhydrAMINE (BENADRYL) injection 50 mg  50 mg Intravenous Once Heath Lark, MD      . famotidine (PEPCID) IVPB 20 mg premix  20 mg Intravenous Once Heath Lark, MD      . heparin lock flush 100 unit/mL  500 Units Intracatheter Once PRN Heath Lark, MD      . ondansetron (ZOFRAN) 16 mg, dexamethasone (DECADRON) 20 mg in sodium chloride 0.9 % 50 mL IVPB   Intravenous Once Heath Lark, MD      . PACLitaxel (TAXOL) 66 mg in dextrose 5 % 250 mL chemo infusion (</= '80mg'$ /m2)  45 mg/m2 (Treatment Plan Actual) Intravenous Once Heath Lark, MD      . sodium chloride 0.9 % injection 10 mL  10 mL Intracatheter PRN Heath Lark, MD        PHYSICAL EXAMINATION: ECOG PERFORMANCE STATUS: 1 - Symptomatic but completely ambulatory  Filed Vitals:   10/21/14 1101  BP: 154/84  Pulse: 106  Temp: 98.7 F (37.1 C)  Resp: 18   Filed Weights   10/21/14 1101  Weight: 119 lb 6.4 oz (54.159 kg)     GENERAL:alert, no distress and comfortable SKIN: Her skin rashes improving. Prior surgical biopsy site is healing. EYES: normal, Conjunctiva are pink and non-injected, sclera clear OROPHARYNX:no exudate, no erythema and lips, buccal mucosa, and tongue normal  NECK: supple, thyroid normal size, non-tender, without nodularity LYMPH:  no palpable lymphadenopathy in the cervical, axillary or inguinal LUNGS: clear to auscultation and percussion with normal breathing effort HEART: regular rate & rhythm and no murmurs and no lower extremity edema ABDOMEN:abdomen soft, non-tender and normal bowel sounds Musculoskeletal:no cyanosis of digits and no clubbing  NEURO: alert & oriented x 3 with fluent speech, no focal motor/sensory deficits  LABORATORY DATA:  I have reviewed the data as listed    Component Value Date/Time   NA 140 10/20/2014 1054   NA 141 10/08/2014 1210   K 4.1 10/20/2014 1054   K 3.7 10/08/2014 1210   CL 106 10/08/2014 1210   CO2 27 10/20/2014 1054   CO2 26 10/08/2014 1210   GLUCOSE 94 10/20/2014 1054   GLUCOSE 99 10/08/2014 1210   BUN 15.2 10/20/2014 1054  BUN 12 10/08/2014 1210   CREATININE 0.8 10/20/2014 1054   CREATININE 0.96 10/08/2014 1210   CALCIUM 9.3 10/20/2014 1054   CALCIUM 8.9 10/08/2014 1210   PROT 6.6 10/20/2014 1054   PROT 5.8* 09/12/2014 0450   ALBUMIN 3.2* 10/20/2014 1054   ALBUMIN 3.1* 09/12/2014 0450   AST 19 10/20/2014 1054   AST 41 09/12/2014 0450   ALT 10 10/20/2014 1054   ALT 59* 09/12/2014 0450   ALKPHOS 145 10/20/2014 1054   ALKPHOS 135* 09/12/2014 0450   BILITOT <0.30 10/20/2014 1054   BILITOT 0.7 09/12/2014 0450   GFRNONAA >60 10/08/2014 1210   GFRAA >60 10/08/2014 1210    No results found for: SPEP, UPEP  Lab Results  Component Value Date   WBC 8.7 10/20/2014   NEUTROABS 6.5 10/20/2014   HGB 11.5* 10/20/2014   HCT 35.5 10/20/2014   MCV 91.3 10/20/2014   PLT 302 10/20/2014      Chemistry      Component Value  Date/Time   NA 140 10/20/2014 1054   NA 141 10/08/2014 1210   K 4.1 10/20/2014 1054   K 3.7 10/08/2014 1210   CL 106 10/08/2014 1210   CO2 27 10/20/2014 1054   CO2 26 10/08/2014 1210   BUN 15.2 10/20/2014 1054   BUN 12 10/08/2014 1210   CREATININE 0.8 10/20/2014 1054   CREATININE 0.96 10/08/2014 1210      Component Value Date/Time   CALCIUM 9.3 10/20/2014 1054   CALCIUM 8.9 10/08/2014 1210   ALKPHOS 145 10/20/2014 1054   ALKPHOS 135* 09/12/2014 0450   AST 19 10/20/2014 1054   AST 41 09/12/2014 0450   ALT 10 10/20/2014 1054   ALT 59* 09/12/2014 0450   BILITOT <0.30 10/20/2014 1054   BILITOT 0.7 09/12/2014 0450     ASSESSMENT & PLAN:  Bilateral lung cancer She tolerated cycle 1 of treatment well without any side effects. We will proceed with cycle 2 without dose adjustment.  Chronic neck pain The patient is not consistent with the pain medicine. We have discussed in the past about narcotic refill policy. She felt that IR morphine was too strong and preferred to take oxycodone. I reviewed the medication list with her. The prescribed dose of oxycodone is too low and after much discussion she is willing to try IR morphine again. I prohibit her from getting pain medication refill from other physicians. I also recommended she does not take Percocet because it contained Tylenol.  I'll see her back next week for further assessment of pain control  Excessive weight loss She has significant weight loss due to difficulties with swallowing and loss of appetite. Her appetite has improved now and I recommend her to continue on nutritional supplement. Her healthy weight is around 135 pounds.  Anemia due to antineoplastic chemotherapy This is likely due to recent treatment. The patient denies recent history of bleeding such as epistaxis, hematuria or hematochezia. She is asymptomatic from the anemia. I will observe for now.    No orders of the defined types were placed in this  encounter.   All questions were answered. The patient knows to call the clinic with any problems, questions or concerns. No barriers to learning was detected. I spent 25 minutes counseling the patient face to face. The total time spent in the appointment was 30 minutes and more than 50% was on counseling and review of test results     [facility] [place], MD 10/21/2014 12:03 PM

## 2014-10-21 NOTE — Assessment & Plan Note (Signed)
This is likely due to recent treatment. The patient denies recent history of bleeding such as epistaxis, hematuria or hematochezia. She is asymptomatic from the anemia. I will observe for now.   

## 2014-10-21 NOTE — Assessment & Plan Note (Signed)
She tolerated cycle 1 of treatment well without any side effects. We will proceed with cycle 2 without dose adjustment.

## 2014-10-22 ENCOUNTER — Ambulatory Visit
Admission: RE | Admit: 2014-10-22 | Discharge: 2014-10-22 | Disposition: A | Discharge status: Home / Self Care | Payer: Medicaid Other | Source: Ambulatory Visit | Attending: Radiation Oncology | Admitting: Radiation Oncology

## 2014-10-22 DIAGNOSIS — Z510 Encounter for antineoplastic radiation therapy: Secondary | ICD-10-CM | POA: Diagnosis not present

## 2014-10-23 ENCOUNTER — Ambulatory Visit
Admission: RE | Admit: 2014-10-23 | Discharge: 2014-10-23 | Disposition: A | Discharge status: Home / Self Care | Payer: Medicaid Other | Source: Ambulatory Visit | Attending: Radiation Oncology | Admitting: Radiation Oncology

## 2014-10-23 DIAGNOSIS — Z510 Encounter for antineoplastic radiation therapy: Secondary | ICD-10-CM | POA: Diagnosis not present

## 2014-10-24 ENCOUNTER — Other Ambulatory Visit: Payer: Self-pay

## 2014-10-24 ENCOUNTER — Observation Stay (HOSPITAL_COMMUNITY)
Admission: EM | Admit: 2014-10-24 | Discharge: 2014-10-28 | Disposition: A | Discharge status: Home / Self Care | Payer: Medicaid Other | Attending: Internal Medicine | Admitting: Internal Medicine

## 2014-10-24 ENCOUNTER — Encounter (HOSPITAL_COMMUNITY): Payer: Self-pay | Admitting: Emergency Medicine

## 2014-10-24 DIAGNOSIS — C3492 Malignant neoplasm of unspecified part of left bronchus or lung: Secondary | ICD-10-CM

## 2014-10-24 DIAGNOSIS — I10 Essential (primary) hypertension: Secondary | ICD-10-CM | POA: Insufficient documentation

## 2014-10-24 DIAGNOSIS — C3490 Malignant neoplasm of unspecified part of unspecified bronchus or lung: Secondary | ICD-10-CM | POA: Diagnosis not present

## 2014-10-24 DIAGNOSIS — C3491 Malignant neoplasm of unspecified part of right bronchus or lung: Secondary | ICD-10-CM | POA: Diagnosis present

## 2014-10-24 DIAGNOSIS — M542 Cervicalgia: Secondary | ICD-10-CM

## 2014-10-24 DIAGNOSIS — R079 Chest pain, unspecified: Principal | ICD-10-CM | POA: Insufficient documentation

## 2014-10-24 DIAGNOSIS — G8929 Other chronic pain: Secondary | ICD-10-CM | POA: Diagnosis present

## 2014-10-24 DIAGNOSIS — R0789 Other chest pain: Secondary | ICD-10-CM | POA: Diagnosis present

## 2014-10-24 DIAGNOSIS — R000 Tachycardia, unspecified: Secondary | ICD-10-CM | POA: Insufficient documentation

## 2014-10-24 NOTE — ED Notes (Signed)
Patient complaining of generalized body aches. Reports she feels like she has had heart burn for 3 days. Reports chest pain, back pain, headache.

## 2014-10-24 NOTE — ED Provider Notes (Signed)
CSN: [id number]     Arrival date & time 10/24/14  2337 History  By signing my name below, I, Megan Sims, attest that this documentation has been prepared under the direction and in the presence of Megan Essex, MD. Electronically Signed: Helane Sims, ED Scribe. 10/25/2014. 12:11 AM.    Chief Complaint  Patient presents with  . Generalized Body Aches   The history is provided by the patient. No language interpreter was used.   HPI Comments: Megan Sims is a 59 y.o. female with a PMHx of stage 3 lung cancer, diagnosed 3 months ago, who presents to the Emergency Department complaining of constant, central chest pain radiating to the back onset 9:30 PM tonight while sitting and watching TV. She reports associated palpitations, SOB, mild abdominal pain, lower back pain, and gradual onset left-sided HA. She notes the abdominal and back pain began after she began treatment for her cancer. She denies having similar problems like this before and states she was feeling fine until the pain began. She states she is receiving chemotherapy and radiation treatments once a week. She denies a PMHx of heart issues. Pt denies cough and fever.  Past Medical History  Diagnosis Date  . Migraine   . Lung abnormality   . Hypertension   . Pleurisy   . Cerebral aneurysm 2 brain surgeries 96 or 97  . Paget disease of bone   . Diarrhea   . Renal insufficiency     Patient states " no kidney problems  . Malignant neoplasm of unknown origin (Megan Sims)   . S/P biopsy     of throat per patient.  . Oral thrush 09/24/2014  . Nicotine dependence 09/24/2014  . Chronic neck pain 09/24/2014  . Cancer [facility])     malignant neoplasm of unknown origin   Past Surgical History  Procedure Laterality Date  . Chest tube insertion    . Tubal ligation    . Cerebral aneurysm repair  96 or 97  . Colonoscopy     Family History  Problem Relation Age of Onset  . Hypertension    . Diabetes    . Kidney disease    . Cancer Mother      throat ca  . Cancer Maternal Grandmother     thyroid ca   Social History  Substance Use Topics  . Smoking status: Current Every Day Smoker -- 0.50 packs/day for 34 years    Types: Cigarettes  . Smokeless tobacco: Never Used  . Alcohol Use: No   OB History    Gravida Para Term Preterm AB TAB SAB Ectopic Multiple Living            3     Review of Systems A complete 10 system review of systems was obtained and all systems are negative except as noted in the HPI and PMH.    Allergies  Hydrocodone-acetaminophen  Home Medications   Prior to Admission medications   Medication Sig Start Date End Date Taking? Authorizing Provider  amLODipine (NORVASC) 10 MG tablet Take 1 tablet (10 mg total) by mouth daily. 09/12/14   Cherene Altes, MD  carvedilol (COREG) 6.25 MG tablet Take 1 tablet (6.25 mg total) by mouth 2 (two) times daily with a meal. 09/12/14   Cherene Altes, MD  EPINEPHrine 0.3 mg/0.3 mL IJ SOAJ injection Inject 0.3 mLs (0.3 mg total) into the muscle once. 09/12/14   Cherene Altes, MD  lidocaine-prilocaine (EMLA) cream Apply to affected area once 10/12/14  Heath Lark, MD  lisinopril (PRINIVIL,ZESTRIL) 20 MG tablet Take 1 tablet (20 mg total) by mouth daily. 10/12/14   Heath Lark, MD  morphine (MSIR) 15 MG tablet Take 1 tablet (15 mg total) by mouth every 6 (six) hours as needed. Patient not taking: Reported on 10/12/2014 10/01/14   Heath Lark, MD  ondansetron (ZOFRAN) 8 MG tablet Take 1 tablet (8 mg total) by mouth every 8 (eight) hours as needed. 10/12/14   Heath Lark, MD  oxyCODONE (ROXICODONE) 5 MG immediate release tablet Take 0.5 tablets (2.5 mg total) by mouth every 4 (four) hours as needed for severe pain. 09/17/14   Deno Etienne, DO  polyethylene glycol [facility]) packet Take 17 g by mouth daily. 10/01/14   Heath Lark, MD  prochlorperazine (COMPAZINE) 10 MG tablet Take 1 tablet (10 mg total) by mouth every 6 (six) hours as needed (Nausea or vomiting). 10/12/14   Heath Lark,  MD  triamcinolone cream (KENALOG) 0.1 % Apply 1 application topically 4 (four) times daily as needed. 08/21/14   Corene Cornea Mesner, MD   BP 160/93 mmHg  Pulse 94  Temp(Src) 98.4 F (36.9 C) (Oral)  Resp 18  SpO2 99% Physical Exam  Constitutional: She is oriented to person, place, and time. She appears well-developed and well-nourished. No distress.  HENT:  Head: Normocephalic and atraumatic.  Mouth/Throat: Oropharynx is clear and moist. No oropharyngeal exudate.  Eyes: Conjunctivae and EOM are normal. Pupils are equal, round, and reactive to light.  Neck: Normal range of motion. Neck supple.  No meningismus.  Cardiovascular: Normal rate, normal heart sounds and intact distal pulses.   No murmur heard. tachycardic  Pulmonary/Chest: Effort normal and breath sounds normal. No respiratory distress.  Abdominal: Soft. There is tenderness. There is no rebound and no guarding.  Mild diffuse tenderness  Musculoskeletal: Normal range of motion. She exhibits no edema or tenderness.  Neurological: She is alert and oriented to person, place, and time. No cranial nerve deficit. She exhibits normal muscle tone. Coordination normal.  No ataxia on finger to nose bilaterally. No pronator drift. 5/5 strength throughout. CN 2-12 intact. Negative Romberg. Equal grip strength. Sensation intact. Gait is normal.   Skin: Skin is warm.  Psychiatric: She has a normal mood and affect. Her behavior is normal.  Nursing note and vitals reviewed.   ED Course  Procedures  DIAGNOSTIC STUDIES: Oxygen Saturation is 100% on RA, normal by my interpretation.    COORDINATION OF CARE: 12:08 AM - Discussed plans to order diagnostic studies and imaging. Pt advised of plan for treatment and pt agrees.  Labs Review Labs Reviewed  CBC WITH DIFFERENTIAL/PLATELET - Abnormal; Notable for the following:    RBC 3.77 (*)    Hemoglobin 11.3 (*)    HCT 34.2 (*)    RDW 17.7 (*)    All other components within normal limits   COMPREHENSIVE METABOLIC PANEL - Abnormal; Notable for the following:    Glucose, Bld 102 (*)    Calcium 8.4 (*)    Albumin 3.4 (*)    ALT 11 (*)    All other components within normal limits  URINALYSIS, ROUTINE W REFLEX MICROSCOPIC (NOT AT [facility]) - Abnormal; Notable for the following:    Hgb urine dipstick TRACE (*)    Leukocytes, UA MODERATE (*)    All other components within normal limits  URINE MICROSCOPIC-ADD ON - Abnormal; Notable for the following:    Bacteria, UA FEW (*)    All other components within normal limits  I-STAT CHEM 8, ED - Abnormal; Notable for the following:    Glucose, Bld 101 (*)    All other components within normal limits  CULTURE, BLOOD (ROUTINE X 2)  CULTURE, BLOOD (ROUTINE X 2)  URINE CULTURE  TROPONIN I  TROPONIN I  I-STAT CG4 LACTIC ACID, ED  I-STAT CG4 LACTIC ACID, ED    Imaging Review Ct Head Wo Contrast  10/25/2014   CLINICAL DATA:  Weakness and intermittent headache  EXAM: CT HEAD WITHOUT CONTRAST  TECHNIQUE: Contiguous axial images were obtained from the base of the skull through the vertex without intravenous contrast.  COMPARISON:  Brain MRI 10/02/2014  FINDINGS: Skull and Sinuses:Remote bilateral pterional craniotomy for aneurysm clipping.  Remote blowout fracture of the medial wall right orbit.  No acute finding or destructive process. No sinusitis or mastoiditis  Orbits: No acute abnormality.  Brain: No evidence of acute infarction, hemorrhage, hydrocephalus, or mass lesion/mass effect.  Aneurysm clips around the bilateral circle-of-Willis have stable orientation.  Gliosis in the right temporal pole, likely from surgical access, is stable. Stable pattern of advanced for age chronic small vessel disease, patchy throughout the bilateral cerebral white matter and underestimated compared MRI. 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  IMPRESSION: 1. No acute finding. 2. Chronic small vessel disease and sequela of bilateral  aneurysm clipping.   Electronically Signed   By: Monte Fantasia M.D.   On: 10/25/2014 04:04   Ct Angio Chest Pe W/cm &/or Wo Cm  10/25/2014   CLINICAL DATA:  Chest pain and tachycardia.  Lung cancer.  EXAM: CT ANGIOGRAPHY CHEST WITH CONTRAST  TECHNIQUE: Multidetector CT imaging of the chest was performed using the standard protocol during bolus administration of intravenous contrast. Multiplanar CT image reconstructions and MIPs were obtained to evaluate the vascular anatomy.  CONTRAST:  125m OMNIPAQUE IOHEXOL 350 MG/ML SOLN  COMPARISON:  09/10/2014  FINDINGS: THORACIC INLET/BODY WALL:  Left IJ catheter, tip at the upper SVC.  Right supraclavicular fossa adenopathy difficult to visualize due to contrast timing. No indication of progression.  MEDIASTINUM:  No cardiomegaly. Thick appearance of the left ventricle, probable hypertrophy. Coronary atherosclerosis, extensive at and below the left main bifurcation. No acute aortic finding. When accounting for motion, negative for pulmonary embolism.  Diminished prevascular and paratracheal lymphadenopathy. The paratracheal noticed 15 mm today, previously 20 mm.  LUNG WINDOWS:  There is no edema, consolidation, effusion, or pneumothorax.  Chronic diffuse bronchial wall thickening.  UPPER ABDOMEN:  No acute findings. Left adrenal thickening, not hypermetabolic on recent PET.  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 No acute finding.  Review of the MIP images confirms the above findings.  IMPRESSION: 1. Negative for pulmonary embolism or other acute finding. 2. 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 3. Coronary atherosclerosis. 4. 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   Electronically Signed   By: JMonte FantasiaM.D.   On: 10/25/2014 02:22   Dg Chest Portable 1 View  10/25/2014   CLINICAL DATA:  Generalized body aches,  heartburn symptoms for 3 days. Chest pain, back pain and headache. On weekly chemo radiation. History of lung cancer.  EXAM: PORTABLE CHEST 1 VIEW  COMPARISON:  Chest radiograph September 08, 2014  FINDINGS: Cardiomediastinal silhouette is normal. Strandy densities LEFT lung base.The lungs are clear without pleural effusions or focal consolidations. Single lumen LEFT chest Port-A-Cath with distal  tip projecting in proximal superior vena cava. Trachea projects midline and there is no pneumothorax. Soft tissue planes and included osseous structures are non-suspicious.  IMPRESSION: LEFT lung base atelectasis.   Electronically Signed   By: Elon Alas M.D.   On: 10/25/2014 00:36   I have personally reviewed and evaluated these images and lab results as part of my medical decision-making.   EKG Interpretation   Date/Time:  Sunday October 25 2014 01:15:17 EDT Ventricular Rate:  97 PR Interval:  181 QRS Duration: 82 QT Interval:  354 QTC Calculation: 450 R Axis:   37 Text Interpretation:  Sinus rhythm LAE, consider biatrial enlargement  Anteroseptal infarct, old Rate slower Confirmed by Wyvonnia Dusky  MD, [place]  [phone]) on 10/25/2014 1:39:05 AM      MDM   Final diagnoses:  Chest pain, unspecified chest pain type  Tachycardia   Patient with onset of chest pain, palpitations, headache, body aches around 9 PM. Headache is gradual in onset.  EKG is sinus tachycardia at about 150. Initially there was concern for atrial flutter so IV Cardizem was given briefly without much response. IV fluids given as well.  No change with Cardizem. However heart rate did decrease to the 90s without further intervention. Workup shows UTI. With chest pain and tachycardia and history of cancer, CT angiogram was performed but there is no evidence of pulmonary embolism.  Troponin negative. Patient feeling improved but still some mild chest pain. Heart rate normal in the 90s. Workup otherwise unremarkable. Notably  there are some coronary calcifications seen on CT.  Uncertain etiology of tachycardia. Plan chest pain rule out with additional observation. Treat UTI D/w Dr. Shanon Brow.  I personally performed the services described in this documentation, which was scribed in my presence. The recorded information has been reviewed and is accurate.   Megan Essex, MD 10/25/14 [phone]

## 2014-10-25 ENCOUNTER — Emergency Department (HOSPITAL_COMMUNITY): Payer: Medicaid Other

## 2014-10-25 ENCOUNTER — Encounter (HOSPITAL_COMMUNITY): Payer: Self-pay | Admitting: *Deleted

## 2014-10-25 ENCOUNTER — Observation Stay (HOSPITAL_BASED_OUTPATIENT_CLINIC_OR_DEPARTMENT_OTHER): Payer: Medicaid Other

## 2014-10-25 DIAGNOSIS — R000 Tachycardia, unspecified: Secondary | ICD-10-CM | POA: Diagnosis not present

## 2014-10-25 DIAGNOSIS — R079 Chest pain, unspecified: Secondary | ICD-10-CM

## 2014-10-25 DIAGNOSIS — R0789 Other chest pain: Secondary | ICD-10-CM | POA: Diagnosis present

## 2014-10-25 LAB — URINE MICROSCOPIC-ADD ON

## 2014-10-25 LAB — TROPONIN I
Troponin I: <0.03 ng/mL (ref <0.031)
Troponin I: <0.03 ng/mL (ref <0.031)
Troponin I: <0.03 ng/mL (ref <0.031)

## 2014-10-25 LAB — CBC WITH DIFFERENTIAL/PLATELET
Basophils Absolute: 0 10*3/uL (ref 0.0–0.1)
Basophils Relative: 0 %
EOS ABS: 0.1 10*3/uL (ref 0.0–0.7)
EOS PCT: 1 %
HCT: 34.2 % — ABNORMAL LOW (ref 36.0–46.0)
Hemoglobin: 11.3 g/dL — ABNORMAL LOW (ref 12.0–15.0)
LYMPHS ABS: 0.9 10*3/uL (ref 0.7–4.0)
Lymphocytes Relative: 17 %
MCH: 30 pg (ref 26.0–34.0)
MCHC: 33 g/dL (ref 30.0–36.0)
MCV: 90.7 fL (ref 78.0–100.0)
Monocytes Absolute: 0.7 10*3/uL (ref 0.1–1.0)
Monocytes Relative: 12 %
Neutro Abs: 3.8 10*3/uL (ref 1.7–7.7)
Neutrophils Relative %: 70 %
PLATELETS: 301 10*3/uL (ref 150–400)
RBC: 3.77 MIL/uL — AB (ref 3.87–5.11)
RDW: 17.7 % — ABNORMAL HIGH (ref 11.5–15.5)
WBC: 5.5 10*3/uL (ref 4.0–10.5)

## 2014-10-25 LAB — I-STAT CG4 LACTIC ACID, ED
LACTIC ACID, VENOUS: 0.68 mmol/L (ref 0.5–2.0)
Lactic Acid, Venous: 0.58 mmol/L (ref 0.5–2.0)

## 2014-10-25 LAB — I-STAT CHEM 8, ED
BUN: 15 mg/dL (ref 6–20)
CHLORIDE: 106 mmol/L (ref 101–111)
Calcium, Ion: 1.19 mmol/L (ref 1.12–1.23)
Creatinine, Ser: 0.8 mg/dL (ref 0.44–1.00)
Glucose, Bld: 101 mg/dL — ABNORMAL HIGH (ref 65–99)
HEMATOCRIT: 38 % (ref 36.0–46.0)
Hemoglobin: 12.9 g/dL (ref 12.0–15.0)
POTASSIUM: 3.6 mmol/L (ref 3.5–5.1)
SODIUM: 141 mmol/L (ref 135–145)
TCO2: 22 mmol/L (ref 0–100)

## 2014-10-25 LAB — URINALYSIS, ROUTINE W REFLEX MICROSCOPIC
Bilirubin Urine: NEGATIVE
Glucose, UA: NEGATIVE mg/dL
Ketones, ur: NEGATIVE mg/dL
NITRITE: NEGATIVE
Protein, ur: NEGATIVE mg/dL
SPECIFIC GRAVITY, URINE: 1.01 (ref 1.005–1.030)
UROBILINOGEN UA: 0.2 mg/dL (ref 0.0–1.0)
pH: 7 (ref 5.0–8.0)

## 2014-10-25 LAB — COMPREHENSIVE METABOLIC PANEL
ALT: 11 U/L — AB (ref 14–54)
ANION GAP: 6 (ref 5–15)
AST: 20 U/L (ref 15–41)
Albumin: 3.4 g/dL — ABNORMAL LOW (ref 3.5–5.0)
Alkaline Phosphatase: 123 U/L (ref 38–126)
BUN: 16 mg/dL (ref 6–20)
CHLORIDE: 108 mmol/L (ref 101–111)
CO2: 23 mmol/L (ref 22–32)
Calcium: 8.4 mg/dL — ABNORMAL LOW (ref 8.9–10.3)
Creatinine, Ser: 0.77 mg/dL (ref 0.44–1.00)
GFR calc non Af Amer: >60 mL/min (ref >60)
Glucose, Bld: 102 mg/dL — ABNORMAL HIGH (ref 65–99)
Potassium: 3.6 mmol/L (ref 3.5–5.1)
SODIUM: 137 mmol/L (ref 135–145)
Total Bilirubin: 0.5 mg/dL (ref 0.3–1.2)
Total Protein: 6.6 g/dL (ref 6.5–8.1)

## 2014-10-25 MED ORDER — DEXTROSE 5 % IV SOLN
1.0000 g | INTRAVENOUS | Status: DC
  Administered 2014-10-26 – 2014-10-28 (×3): 1 g via INTRAVENOUS
  Filled 2014-10-25 (×5): qty 10

## 2014-10-25 MED ORDER — ASPIRIN EC 325 MG PO TBEC
325.0000 mg | DELAYED_RELEASE_TABLET | Freq: Every day | ORAL | Status: DC
  Administered 2014-10-25 – 2014-10-28 (×4): 325 mg via ORAL
  Filled 2014-10-25 (×4): qty 1

## 2014-10-25 MED ORDER — DILTIAZEM HCL 100 MG IV SOLR
5.0000 mg/h | Freq: Once | INTRAVENOUS | Status: AC
  Administered 2014-10-25: 10 mg/h via INTRAVENOUS
  Filled 2014-10-25: qty 100

## 2014-10-25 MED ORDER — POLYETHYLENE GLYCOL 3350 17 G PO PACK
17.0000 g | PACK | Freq: Every day | ORAL | Status: DC
  Administered 2014-10-25 – 2014-10-28 (×3): 17 g via ORAL
  Filled 2014-10-25 (×4): qty 1

## 2014-10-25 MED ORDER — ONDANSETRON HCL 4 MG/2ML IJ SOLN
4.0000 mg | Freq: Four times a day (QID) | INTRAMUSCULAR | Status: DC | PRN
  Administered 2014-10-25: 4 mg via INTRAVENOUS
  Filled 2014-10-25: qty 2

## 2014-10-25 MED ORDER — SODIUM CHLORIDE 0.9 % IV BOLUS (SEPSIS)
1000.0000 mL | Freq: Once | INTRAVENOUS | Status: AC
  Administered 2014-10-25: 1000 mL via INTRAVENOUS

## 2014-10-25 MED ORDER — LISINOPRIL 10 MG PO TABS
20.0000 mg | ORAL_TABLET | Freq: Every day | ORAL | Status: DC
  Administered 2014-10-25: 20 mg via ORAL
  Filled 2014-10-25 (×2): qty 2

## 2014-10-25 MED ORDER — PROCHLORPERAZINE MALEATE 10 MG PO TABS: 10.0000 mg | ORAL_TABLET | Freq: Four times a day (QID) | ORAL | Status: DC | PRN

## 2014-10-25 MED ORDER — DEXTROSE 5 % IV SOLN
1.0000 g | Freq: Once | INTRAVENOUS | Status: AC
  Administered 2014-10-25: 1 g via INTRAVENOUS
  Filled 2014-10-25: qty 10

## 2014-10-25 MED ORDER — IOHEXOL 350 MG/ML SOLN
100.0000 mL | Freq: Once | INTRAVENOUS | Status: AC | PRN
  Administered 2014-10-25: 100 mL via INTRAVENOUS

## 2014-10-25 MED ORDER — MORPHINE SULFATE 15 MG PO TABS
15.0000 mg | ORAL_TABLET | Freq: Four times a day (QID) | ORAL | Status: DC | PRN
  Administered 2014-10-25 – 2014-10-26 (×4): 15 mg via ORAL
  Filled 2014-10-25 (×5): qty 1

## 2014-10-25 MED ORDER — ACETAMINOPHEN 325 MG PO TABS: 650.0000 mg | ORAL_TABLET | ORAL | Status: DC | PRN

## 2014-10-25 MED ORDER — OXYCODONE HCL 5 MG PO TABS: 2.5000 mg | ORAL_TABLET | ORAL | Status: DC | PRN

## 2014-10-25 MED ORDER — ASPIRIN 81 MG PO CHEW
324.0000 mg | CHEWABLE_TABLET | Freq: Once | ORAL | Status: AC
  Administered 2014-10-25: 324 mg via ORAL
  Filled 2014-10-25: qty 4

## 2014-10-25 MED ORDER — DILTIAZEM HCL 25 MG/5ML IV SOLN
5.0000 mg | Freq: Once | INTRAVENOUS | Status: AC
  Administered 2014-10-25: 5 mg via INTRAVENOUS
  Filled 2014-10-25: qty 5

## 2014-10-25 MED ORDER — CARVEDILOL 6.25 MG PO TABS
6.2500 mg | ORAL_TABLET | Freq: Two times a day (BID) | ORAL | Status: DC
  Administered 2014-10-25 – 2014-10-28 (×6): 6.25 mg via ORAL
  Filled 2014-10-25 (×2): qty 2
  Filled 2014-10-25 (×4): qty 1
  Filled 2014-10-25: qty 2

## 2014-10-25 MED ORDER — GI COCKTAIL ~~LOC~~
30.0000 mL | Freq: Four times a day (QID) | ORAL | Status: DC | PRN
  Administered 2014-10-25: 30 mL via ORAL
  Filled 2014-10-25: qty 30

## 2014-10-25 MED ORDER — AMLODIPINE BESYLATE 10 MG PO TABS
10.0000 mg | ORAL_TABLET | Freq: Every day | ORAL | Status: DC
  Administered 2014-10-25 – 2014-10-28 (×4): 10 mg via ORAL
  Filled 2014-10-25: qty 2
  Filled 2014-10-25: qty 1
  Filled 2014-10-25: qty 2
  Filled 2014-10-25: qty 1

## 2014-10-25 NOTE — Progress Notes (Signed)
  Echocardiogram 2D Echocardiogram has been performed.  Lysle Rubens 10/25/2014, 10:31 AM

## 2014-10-25 NOTE — H&P (Signed)
PCP:   FANTA,TESFAYE, MD   Chief Complaint:  cp  HPI: 59 yo female stage 3 lung cancer started chemo and XRT 3 weeks ago comes in with sudden chest pain at rest and around her port today that lasted for over an hour with associated sob and palpitations. Symptoms are resolved now.  No fevers.  No n/v/d.  No prior cardiac history.  No swelling in legs.  Pt referred for chest pain.  Review of Systems:  Positive and negative as per HPI otherwise all other systems are negative  Past Medical History: Past Medical History  Diagnosis Date  . Migraine   . Lung abnormality   . Hypertension   . Pleurisy   . Cerebral aneurysm 2 brain surgeries 96 or 97  . Paget disease of bone   . Diarrhea   . Renal insufficiency     Patient states " no kidney problems  . Malignant neoplasm of unknown origin ([place])   . S/P biopsy     of throat per patient.  . Oral thrush 09/24/2014  . Nicotine dependence 09/24/2014  . Chronic neck pain 09/24/2014  . Cancer [facility])     malignant neoplasm of unknown origin   Past Surgical History  Procedure Laterality Date  . Chest tube insertion    . Tubal ligation    . Cerebral aneurysm repair  96 or 97  . Colonoscopy      Medications: Prior to Admission medications   Medication Sig Start Date End Date Taking? Authorizing Provider  amLODipine (NORVASC) 10 MG tablet Take 1 tablet (10 mg total) by mouth daily. 09/12/14   Cherene Altes, MD  carvedilol (COREG) 6.25 MG tablet Take 1 tablet (6.25 mg total) by mouth 2 (two) times daily with a meal. 09/12/14   Cherene Altes, MD  EPINEPHrine 0.3 mg/0.3 mL IJ SOAJ injection Inject 0.3 mLs (0.3 mg total) into the muscle once. 09/12/14   Cherene Altes, MD  lidocaine-prilocaine (EMLA) cream Apply to affected area once 10/12/14   Heath Lark, MD  lisinopril (PRINIVIL,ZESTRIL) 20 MG tablet Take 1 tablet (20 mg total) by mouth daily. 10/12/14   Heath Lark, MD  morphine (MSIR) 15 MG tablet Take 1 tablet (15 mg total) by mouth  every 6 (six) hours as needed. Patient not taking: Reported on 10/12/2014 10/01/14   Heath Lark, MD  ondansetron (ZOFRAN) 8 MG tablet Take 1 tablet (8 mg total) by mouth every 8 (eight) hours as needed. 10/12/14   Heath Lark, MD  oxyCODONE (ROXICODONE) 5 MG immediate release tablet Take 0.5 tablets (2.5 mg total) by mouth every 4 (four) hours as needed for severe pain. 09/17/14   Deno Etienne, DO  polyethylene glycol [facility]) packet Take 17 g by mouth daily. 10/01/14   Heath Lark, MD  prochlorperazine (COMPAZINE) 10 MG tablet Take 1 tablet (10 mg total) by mouth every 6 (six) hours as needed (Nausea or vomiting). 10/12/14   Heath Lark, MD  triamcinolone cream (KENALOG) 0.1 % Apply 1 application topically 4 (four) times daily as needed. 08/21/14   Merrily Pew, MD    Allergies:   Allergies  Allergen Reactions  . Hydrocodone-Acetaminophen     Increases secretions nad sensation of throat swelling    Social History:  reports that she has been smoking Cigarettes.  She has a 17 pack-year smoking history. She has never used smokeless tobacco. She reports that she does not drink alcohol or use illicit drugs.  Family History: Family History  Problem  Relation Age of Onset  . Hypertension    . Diabetes    . Kidney disease    . Cancer Mother     throat ca  . Cancer Maternal Grandmother     thyroid ca    Physical Exam: Filed Vitals:   10/25/14 0100 10/25/14 0230 10/25/14 0300 10/25/14 0330  BP: 124/90 140/88 133/80 160/93  Pulse: 132  94   Temp:      TempSrc:      Resp: '16 18 26 18  '$ SpO2: 99%  99%    General appearance: alert, cooperative and no distress Head: Normocephalic, without obvious abnormality, atraumatic Eyes: negative Nose: Nares normal. Septum midline. Mucosa normal. No drainage or sinus tenderness. Neck: no JVD and supple, symmetrical, trachea midline Lungs: clear to auscultation bilaterally Heart: regular rate and rhythm, S1, S2 normal, no murmur, click, rub or gallop  ttp  around port no s/sx of infection Abdomen: soft, non-tender; bowel sounds normal; no masses,  no organomegaly Extremities: extremities normal, atraumatic, no cyanosis or edema Pulses: 2+ and symmetric Skin: Skin color, texture, turgor normal. No rashes or lesions Neurologic: Alert and oriented X 3, normal strength and tone. Normal symmetric reflexes. Normal coordination and gait    Labs on Admission:   Recent Labs  10/25/14 0021 10/25/14 0032  NA 137 141  K 3.6 3.6  CL 108 106  CO2 23  --   GLUCOSE 102* 101*  BUN 16 15  CREATININE 0.77 0.80  CALCIUM 8.4*  --     Recent Labs  10/25/14 0021  AST 20  ALT 11*  ALKPHOS 123  BILITOT 0.5  PROT 6.6  ALBUMIN 3.4*     Recent Labs  10/25/14 0021 10/25/14 0032  WBC 5.5  --   NEUTROABS 3.8  --   HGB 11.3* 12.9  HCT 34.2* 38.0  MCV 90.7  --   PLT 301  --     Recent Labs  10/25/14 0021  TROPONINI <0.03    Radiological Exams on Admission: Ct Angio Chest Pe W/cm &/or Wo Cm  10/25/2014   CLINICAL DATA:  Chest pain and tachycardia.  Lung cancer.  EXAM: CT ANGIOGRAPHY CHEST WITH CONTRAST  TECHNIQUE: Multidetector CT imaging of the chest was performed using the standard protocol during bolus administration of intravenous contrast. Multiplanar CT image reconstructions and MIPs were obtained to evaluate the vascular anatomy.  CONTRAST:  145m OMNIPAQUE IOHEXOL 350 MG/ML SOLN  COMPARISON:  09/10/2014  FINDINGS: THORACIC INLET/BODY WALL:  Left IJ catheter, tip at the upper SVC.  Right supraclavicular fossa adenopathy difficult to visualize due to contrast timing. No indication of progression.  MEDIASTINUM:  No cardiomegaly. Thick appearance of the left ventricle, probable hypertrophy. Coronary atherosclerosis, extensive at and below the left main bifurcation. No acute aortic finding. When accounting for motion, negative for pulmonary embolism.  Diminished prevascular and paratracheal lymphadenopathy. The paratracheal noticed 15 mm  today, previously 20 mm.  LUNG WINDOWS:  There is no edema, consolidation, effusion, or pneumothorax.  Chronic diffuse bronchial wall thickening.  UPPER ABDOMEN:  No acute findings. Left adrenal thickening, not hypermetabolic on recent PET.  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 No acute finding.  Review of the MIP images confirms the above findings.  IMPRESSION: 1. Negative for pulmonary embolism or other acute finding. 2. 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 3. Coronary atherosclerosis. 4. Lower thoracic and  upper lumbar Paget's disease.   Electronically Signed   By: Monte Fantasia M.D.   On: 10/25/2014 02:22   Mr Jeri Cos VZ Contrast  10/03/2014   CLINICAL DATA:  New diagnosis of bilateral lung cancer. Personal history of bilateral craniotomies with aneurysm clips.  EXAM: MRI HEAD WITHOUT AND WITH CONTRAST  TECHNIQUE: Multiplanar, multiecho pulse sequences of the brain and surrounding structures were obtained without and with intravenous contrast.  CONTRAST:  10 mL MultiHance  COMPARISON:  CT head without contrast 03/14/2012.  FINDINGS: Bilateral pterional craniotomies are present for ICA aneurysm clips. These clips create moderate artifact on some sequences.  Extensive atrophy and white matter disease is advanced for age. A remote left paramedian pontine infarct is noted. Remote lacunar infarcts are present in the thalami, more prominent on the right. A remote left caudate head infarct is present.  No acute hemorrhage is present.  The ventricles are of normal size.  The postcontrast images demonstrate no pathologic enhancement. There is no evidence for metastatic disease of the brain or meninges. Artifact from the aneurysm clips obscures only minimal portions of the medial temporal lobes bilaterally.  Flow is present in the visible major intracranial arteries. The globes  and orbits are intact. A remote right medial blowout fracture is noted in the right orbit. The paranasal sinuses and mastoid air cells are clear.  IMPRESSION: 1. No evidence for metastatic disease to the brain. 2. Advanced atrophy and white matter disease compatible with chronic microvascular ischemia. 3. Remote lacunar infarcts of the basal ganglia bilaterally as well as the left paramedian pons. 4. Bilateral pterional craniotomies and ICA aneurysm clips are stable.   Electronically Signed   By: San Morelle M.D.   On: 10/03/2014 11:53   Nm Pet Image Initial (pi) Skull Base To Thigh  09/29/2014   CLINICAL DATA:  Initial treatment strategy for metastatic carcinoma with squamous differentiation, primary site uncertain.  EXAM: NUCLEAR MEDICINE PET SKULL BASE TO THIGH  TECHNIQUE: 11.5 mCi F-18 FDG was injected intravenously. Full-ring PET imaging was performed from the skull base to thigh after the radiotracer. CT data was obtained and used for attenuation correction and anatomic localization.  FASTING BLOOD GLUCOSE:  Value: 86 mg/dl  COMPARISON:  Multiple exams, including 09/10/2014  FINDINGS: NECK  Nonspecific activity along the adenoids mildly hypermetabolic with maximum standard uptake value 5.1. Right supraclavicular lymph node just behind the clavicle measuring 0.7 cm in short axis on image 60 series 4 has maximum standard uptake value 8.2. There is adjacent gas in the soft tissues likely from recent surgical biopsy. Clips are noted lateral to the cavernous sinuses bilaterally.  CHEST  Hypermetabolic mediastinal adenopathy. Right prevascular node measuring 2.0 cm in short axis on image 78 series 4 has maximum standard uptake value 18.6. Right paratracheal node measuring 2.0 cm in short axis on image 81 series 4 has maximum standard uptake value 19.7. Left eccentric prevascular node measuring 2.2 cm in short axis on image 80 series 4 has maximum standard uptake value 15.6. A confluence of ill-defined  ground-glass opacity in the right upper lobe centrally has a maximum standard uptake value of 2.8. Right hilar hypermetabolic node with maximum standard uptake value 14.2 noted.  ABDOMEN/PELVIS  No abnormal hypermetabolic activity within the liver, pancreas, adrenal glands, or spleen. No hypermetabolic lymph nodes in the abdomen or pelvis. Aortoiliac atherosclerotic vascular disease. No abnormal anorectal activity identified.  SKELETON  Pagetoid appearance of T11, L1, L5, and of the entire bony pelvis. This  is not accompanied by significant hypermetabolic bony activity.  Prior bilateral craniotomies.  IMPRESSION: 1. Hypermetabolic mediastinal and right hilar adenopathy. Very faint ground-glass opacity in the right upper lobe with low-grade hypermetabolic activity, conceivably malignant with maximum standard uptake value of 2.8, but seemingly unlikely to be the source of all of this prominent adenopathy in the chest given how ill-defined and sub solid the pulmonary nodularity as. 2. Low-level focus of activity centrally along the adenoids, maximum standard uptake value 5.1, probably from residual active lymphoid tissue rather than as a primary source of the disease elsewhere in the chest. 3. Postoperative findings adjacent to a hypermetabolic right supraclavicular lymph node. 4. Paget's disease in the lower thoracic spine, parts of the lumbar spine, and bony pelvis.   Electronically Signed   By: Van Clines M.D.   On: 09/29/2014 11:01   Ir Fluoro Guide Cv Line Right  10/08/2014   CLINICAL DATA:  Metastatic adenocarcinoma, needs venous access for chemotherapy. Recent right neck surgical biopsy.  EXAM: TUNNELED PORT CATHETER PLACEMENT WITH ULTRASOUND AND FLUOROSCOPIC GUIDANCE  FLUOROSCOPY TIME:  42 seconds, 3 mGy  ANESTHESIA/SEDATION: Intravenous Fentanyl and Versed were administered as conscious sedation during continuous cardiorespiratory monitoring by the radiology RN, with a total moderate sedation time  of less than 30 minutes.  TECHNIQUE: The procedure, risks, benefits, and alternatives were explained to the patient. Questions regarding the procedure were encouraged and answered. The patient understands and consents to the procedure. As antibiotic prophylaxis, cefazolin 2 g was ordered pre-procedure and administered intravenously within one hour of incision. Patency of the left IJ vein was confirmed with ultrasound with image documentation. An appropriate skin site was determined. Skin site was marked. Region was prepped using maximum barrier technique including cap and mask, sterile gown, sterile gloves, large sterile sheet, and Chlorhexidine as cutaneous antisepsis. The region was infiltrated locally with 1% lidocaine. Under real-time ultrasound guidance, the left IJ vein was accessed with a 21 gauge micropuncture needle; the needle tip within the vein was confirmed with ultrasound image documentation. Needle was exchanged over a 018 guidewire for transitional dilator which allowed passage of the [facility] wire into the IVC. Over this, the transitional dilator was exchanged for a 5 [place] MPA catheter. A small incision was made on the left anterior chest wall and a subcutaneous pocket fashioned. The power-injectable port was positioned and its catheter tunneled to the left IJ dermatotomy site. The MPA catheter was exchanged over an Amplatz wire for a peel-away sheath, through which the port catheter, which had been trimmed to the appropriate length, was advanced and positioned under fluoroscopy with its tip at the cavoatrial junction. Spot chest radiograph confirms good catheter position and no pneumothorax. The pocket was closed with deep interrupted and subcuticular continuous 3-0 Monocryl sutures. The port was flushed per protocol. The incisions were covered with Dermabond then covered with a sterile dressing.  COMPLICATIONS: COMPLICATIONS None immediate  IMPRESSION: Technically successful left IJ  power-injectable port catheter placement. Ready for routine use.   Electronically Signed   By: Lucrezia Europe M.D.   On: 10/08/2014 15:17   Ir US Guide Vasc Access Right  10/08/2014   CLINICAL DATA:  Metastatic adenocarcinoma, needs venous access for chemotherapy. Recent right neck surgical biopsy.  EXAM: TUNNELED PORT CATHETER PLACEMENT WITH ULTRASOUND AND FLUOROSCOPIC GUIDANCE  FLUOROSCOPY TIME:  42 seconds, 3 mGy  ANESTHESIA/SEDATION: Intravenous Fentanyl and Versed were administered as conscious sedation during continuous cardiorespiratory monitoring by the radiology RN, with a total moderate sedation  time of less than 30 minutes.  TECHNIQUE: The procedure, risks, benefits, and alternatives were explained to the patient. Questions regarding the procedure were encouraged and answered. The patient understands and consents to the procedure. As antibiotic prophylaxis, cefazolin 2 g was ordered pre-procedure and administered intravenously within one hour of incision. Patency of the left IJ vein was confirmed with ultrasound with image documentation. An appropriate skin site was determined. Skin site was marked. Region was prepped using maximum barrier technique including cap and mask, sterile gown, sterile gloves, large sterile sheet, and Chlorhexidine as cutaneous antisepsis. The region was infiltrated locally with 1% lidocaine. Under real-time ultrasound guidance, the left IJ vein was accessed with a 21 gauge micropuncture needle; the needle tip within the vein was confirmed with ultrasound image documentation. Needle was exchanged over a 018 guidewire for transitional dilator which allowed passage of the [facility] wire into the IVC. Over this, the transitional dilator was exchanged for a 5 [place] MPA catheter. A small incision was made on the left anterior chest wall and a subcutaneous pocket fashioned. The power-injectable port was positioned and its catheter tunneled to the left IJ dermatotomy site. The MPA catheter  was exchanged over an Amplatz wire for a peel-away sheath, through which the port catheter, which had been trimmed to the appropriate length, was advanced and positioned under fluoroscopy with its tip at the cavoatrial junction. Spot chest radiograph confirms good catheter position and no pneumothorax. The pocket was closed with deep interrupted and subcuticular continuous 3-0 Monocryl sutures. The port was flushed per protocol. The incisions were covered with Dermabond then covered with a sterile dressing.  COMPLICATIONS: COMPLICATIONS None immediate  IMPRESSION: Technically successful left IJ power-injectable port catheter placement. Ready for routine use.   Electronically Signed   By: Lucrezia Europe M.D.   On: 10/08/2014 15:17   Dg Chest Portable 1 View  10/25/2014   CLINICAL DATA:  Generalized body aches, heartburn symptoms for 3 days. Chest pain, back pain and headache. On weekly chemo radiation. History of lung cancer.  EXAM: PORTABLE CHEST 1 VIEW  COMPARISON:  Chest radiograph September 08, 2014  FINDINGS: Cardiomediastinal silhouette is normal. Strandy densities LEFT lung base.The lungs are clear without pleural effusions or focal consolidations. Single lumen LEFT chest Port-A-Cath with distal tip projecting in proximal superior vena cava. Trachea projects midline and there is no pneumothorax. Soft tissue planes and included osseous structures are non-suspicious.  IMPRESSION: LEFT lung base atelectasis.   Electronically Signed   By: Elon Alas M.D.   On: 10/25/2014 00:36    Assessment/Plan  59 yo female with stage 3 lung cancer and atypical chest pain Principal Problem:   Chest pain-  Romi, echo in am.  Aspirin.  cta neg for pe and cxr neg edema or infiltrate.  ekg is nonichemic.  obs on tele.  Active Problems:   Essential hypertension   Bilateral lung cancer (HCC)   Chronic neck pain  obs on tele.  Outpatient stress testing for complete cardiac work up.  pcp dr Legrand Rams.  Keaira Whitehurst  A 10/25/2014, 3:56 AM

## 2014-10-25 NOTE — ED Notes (Signed)
Patient ambulatory to and from restroom. Family at bedside.

## 2014-10-26 ENCOUNTER — Ambulatory Visit
Admission: RE | Admit: 2014-10-26 | Discharge: 2014-10-26 | Disposition: A | Discharge status: Home / Self Care | Payer: Medicaid Other | Source: Ambulatory Visit | Attending: Radiation Oncology | Admitting: Radiation Oncology

## 2014-10-26 ENCOUNTER — Encounter: Payer: Self-pay | Admitting: Radiation Oncology

## 2014-10-26 VITALS — BP 113/64 | HR 79 | Temp 98.0°F

## 2014-10-26 DIAGNOSIS — I10 Essential (primary) hypertension: Secondary | ICD-10-CM | POA: Diagnosis not present

## 2014-10-26 DIAGNOSIS — C3492 Malignant neoplasm of unspecified part of left bronchus or lung: Secondary | ICD-10-CM | POA: Diagnosis not present

## 2014-10-26 DIAGNOSIS — R0789 Other chest pain: Secondary | ICD-10-CM | POA: Diagnosis not present

## 2014-10-26 DIAGNOSIS — C3411 Malignant neoplasm of upper lobe, right bronchus or lung: Secondary | ICD-10-CM

## 2014-10-26 DIAGNOSIS — M542 Cervicalgia: Secondary | ICD-10-CM | POA: Diagnosis not present

## 2014-10-26 DIAGNOSIS — Z510 Encounter for antineoplastic radiation therapy: Secondary | ICD-10-CM | POA: Diagnosis not present

## 2014-10-26 DIAGNOSIS — C3491 Malignant neoplasm of unspecified part of right bronchus or lung: Secondary | ICD-10-CM | POA: Diagnosis not present

## 2014-10-26 MED ORDER — ENOXAPARIN SODIUM 40 MG/0.4ML ~~LOC~~ SOLN
40.0000 mg | SUBCUTANEOUS | Status: DC
  Administered 2014-10-26 – 2014-10-27 (×2): 40 mg via SUBCUTANEOUS
  Filled 2014-10-26 (×2): qty 0.4

## 2014-10-26 MED ORDER — LISINOPRIL 20 MG PO TABS
20.0000 mg | ORAL_TABLET | ORAL | Status: DC
Start: 2014-10-26 — End: 2014-10-28
  Administered 2014-10-26: 20 mg via ORAL
  Filled 2014-10-26 (×2): qty 1

## 2014-10-26 NOTE — Progress Notes (Signed)
Subjective: Patient was admitted due to chest pain. Her EKG and cardiac enzyme was negative. She has also pain irritation around her porta cath. Patient is receiving IV antibiotics.  Objective: Vital signs in last 24 hours: Temp:  [98.2 F (36.8 C)-99.1 F (37.3 C)] 99.1 F (37.3 C) (10/03 0554) Pulse Rate:  [85-92] 87 (10/03 0554) Resp:  [18] 18 (10/03 0554) BP: (100-167)/(54-84) 115/60 mmHg (10/03 0554) SpO2:  [98 %-100 %] 100 % (10/03 0554) Weight change:  Last BM Date: 10/25/14  Intake/Output from previous day: 10/02 0701 - 10/03 0700 In: 480 [P.O.:480] Out: -   PHYSICAL EXAM General appearance: alert and no distress Resp: clear to auscultation bilaterally and tenderness and redness of the porta Cath area Cardio: S1, S2 normal GI: soft, non-tender; bowel sounds normal; no masses,  no organomegaly Extremities: extremities normal, atraumatic, no cyanosis or edema  Lab Results:  Results for orders placed or performed during the hospital encounter of 10/24/14 (from the past 48 hour(s))  CBC with Differential/Platelet     Status: Abnormal   Collection Time: 10/25/14 12:21 AM  Result Value Ref Range   WBC 5.5 4.0 - 10.5 K/uL   RBC 3.77 (L) 3.87 - 5.11 MIL/uL   Hemoglobin 11.3 (L) 12.0 - 15.0 g/dL   HCT 34.2 (L) 36.0 - 46.0 %   MCV 90.7 78.0 - 100.0 fL   MCH 30.0 26.0 - 34.0 pg   MCHC 33.0 30.0 - 36.0 g/dL   RDW 17.7 (H) 11.5 - 15.5 %   Platelets 301 150 - 400 K/uL   Neutrophils Relative % 70 %   Neutro Abs 3.8 1.7 - 7.7 K/uL   Lymphocytes Relative 17 %   Lymphs Abs 0.9 0.7 - 4.0 K/uL   Monocytes Relative 12 %   Monocytes Absolute 0.7 0.1 - 1.0 K/uL   Eosinophils Relative 1 %   Eosinophils Absolute 0.1 0.0 - 0.7 K/uL   Basophils Relative 0 %   Basophils Absolute 0.0 0.0 - 0.1 K/uL  Comprehensive metabolic panel     Status: Abnormal   Collection Time: 10/25/14 12:21 AM  Result Value Ref Range   Sodium 137 135 - 145 mmol/L   Potassium 3.6 3.5 - 5.1 mmol/L   Chloride 108 101 - 111 mmol/L   CO2 23 22 - 32 mmol/L   Glucose, Bld 102 (H) 65 - 99 mg/dL   BUN 16 6 - 20 mg/dL   Creatinine, Ser 0.77 0.44 - 1.00 mg/dL   Calcium 8.4 (L) 8.9 - 10.3 mg/dL   Total Protein 6.6 6.5 - 8.1 g/dL   Albumin 3.4 (L) 3.5 - 5.0 g/dL   AST 20 15 - 41 U/L   ALT 11 (L) 14 - 54 U/L   Alkaline Phosphatase 123 38 - 126 U/L   Total Bilirubin 0.5 0.3 - 1.2 mg/dL   GFR calc non Af Amer >60 >60 mL/min   GFR calc Af Amer >60 >60 mL/min    Comment: (NOTE) The eGFR has been calculated using the CKD EPI equation. This calculation has not been validated in all clinical situations. eGFR's persistently <60 mL/min signify possible Chronic Kidney Disease.    Anion gap 6 5 - 15  Troponin I     Status: None   Collection Time: 10/25/14 12:21 AM  Result Value Ref Range   Troponin I <0.03 <0.031 ng/mL    Comment:        NO INDICATION OF MYOCARDIAL INJURY.   Urinalysis, Routine w reflex microscopic (  not at [facility])     Status: Abnormal   Collection Time: 10/25/14 12:21 AM  Result Value Ref Range   Color, Urine YELLOW YELLOW   APPearance CLEAR CLEAR   Specific Gravity, Urine 1.010 1.005 - 1.030   pH 7.0 5.0 - 8.0   Glucose, UA NEGATIVE NEGATIVE mg/dL   Hgb urine dipstick TRACE (A) NEGATIVE   Bilirubin Urine NEGATIVE NEGATIVE   Ketones, ur NEGATIVE NEGATIVE mg/dL   Protein, ur NEGATIVE NEGATIVE mg/dL   Urobilinogen, UA 0.2 0.0 - 1.0 mg/dL   Nitrite NEGATIVE NEGATIVE   Leukocytes, UA MODERATE (A) NEGATIVE  Urine microscopic-add on     Status: Abnormal   Collection Time: 10/25/14 12:21 AM  Result Value Ref Range   Squamous Epithelial / LPF RARE RARE   WBC, UA 7-10 <3 WBC/hpf   RBC / HPF 0-2 <3 RBC/hpf   Bacteria, UA FEW (A) RARE  I-stat chem 8, ed     Status: Abnormal   Collection Time: 10/25/14 12:32 AM  Result Value Ref Range   Sodium 141 135 - 145 mmol/L   Potassium 3.6 3.5 - 5.1 mmol/L   Chloride 106 101 - 111 mmol/L   BUN 15 6 - 20 mg/dL   Creatinine, Ser 0.80  0.44 - 1.00 mg/dL   Glucose, Bld 101 (H) 65 - 99 mg/dL   Calcium, Ion 1.19 1.12 - 1.23 mmol/L   TCO2 22 0 - 100 mmol/L   Hemoglobin 12.9 12.0 - 15.0 g/dL   HCT 38.0 36.0 - 46.0 %  I-Stat CG4 Lactic Acid, ED     Status: None   Collection Time: 10/25/14 12:42 AM  Result Value Ref Range   Lactic Acid, Venous 0.68 0.5 - 2.0 mmol/L  Blood culture (routine x 2)     Status: None (Preliminary result)   Collection Time: 10/25/14  2:39 AM  Result Value Ref Range   Specimen Description RIGHT ANTECUBITAL    Special Requests BOTTLES DRAWN AEROBIC AND ANAEROBIC [place]    Culture NO GROWTH < 12 HOURS    Report Status PENDING   Blood culture (routine x 2)     Status: None (Preliminary result)   Collection Time: 10/25/14  2:47 AM  Result Value Ref Range   Specimen Description RIGHT ANTECUBITAL    Special Requests BOTTLES DRAWN AEROBIC AND ANAEROBIC 8CC    Culture NO GROWTH < 12 HOURS    Report Status PENDING   Troponin I     Status: None   Collection Time: 10/25/14  4:20 AM  Result Value Ref Range   Troponin I <0.03 <0.031 ng/mL    Comment:        NO INDICATION OF MYOCARDIAL INJURY.   I-Stat CG4 Lactic Acid, ED     Status: None   Collection Time: 10/25/14  4:32 AM  Result Value Ref Range   Lactic Acid, Venous 0.58 0.5 - 2.0 mmol/L  Troponin I (q 6hr x 3)     Status: None   Collection Time: 10/25/14 10:36 AM  Result Value Ref Range   Troponin I <0.03 <0.031 ng/mL    Comment:        NO INDICATION OF MYOCARDIAL INJURY.   Troponin I (q 6hr x 3)     Status: None   Collection Time: 10/25/14  4:48 PM  Result Value Ref Range   Troponin I <0.03 <0.031 ng/mL    Comment:        NO INDICATION OF MYOCARDIAL INJURY.  ABGS  Recent Labs  10/25/14 0032  TCO2 22   CULTURES Recent Results (from the past 240 hour(s))  Blood culture (routine x 2)     Status: None (Preliminary result)   Collection Time: 10/25/14  2:39 AM  Result Value Ref Range Status   Specimen Description RIGHT  ANTECUBITAL  Final   Special Requests BOTTLES DRAWN AEROBIC AND ANAEROBIC [place]  Final   Culture NO GROWTH < 12 HOURS  Final   Report Status PENDING  Incomplete  Blood culture (routine x 2)     Status: None (Preliminary result)   Collection Time: 10/25/14  2:47 AM  Result Value Ref Range Status   Specimen Description RIGHT ANTECUBITAL  Final   Special Requests BOTTLES DRAWN AEROBIC AND ANAEROBIC 8CC  Final   Culture NO GROWTH < 12 HOURS  Final   Report Status PENDING  Incomplete   Studies/Results: Ct Head Wo Contrast  10/25/2014   CLINICAL DATA:  Weakness and intermittent headache  EXAM: CT HEAD WITHOUT CONTRAST  TECHNIQUE: Contiguous axial images were obtained from the base of the skull through the vertex without intravenous contrast.  COMPARISON:  Brain MRI 10/02/2014  FINDINGS: Skull and Sinuses:Remote bilateral pterional craniotomy for aneurysm clipping.  Remote blowout fracture of the medial wall right orbit.  No acute finding or destructive process. No sinusitis or mastoiditis  Orbits: No acute abnormality.  Brain: No evidence of acute infarction, hemorrhage, hydrocephalus, or mass lesion/mass effect.  Aneurysm clips around the bilateral circle-of-Willis have stable orientation.  Gliosis in the right temporal pole, likely from surgical access, is stable. Stable pattern of advanced for age chronic small vessel disease, patchy throughout the bilateral cerebral white matter and underestimated compared MRI. 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  IMPRESSION: 1. No acute finding. 2. Chronic small vessel disease and sequela of bilateral aneurysm clipping.   Electronically Signed   By: Monte Fantasia M.D.   On: 10/25/2014 04:04   Ct Angio Chest Pe W/cm &/or Wo Cm  10/25/2014   CLINICAL DATA:  Chest pain and tachycardia.  Lung cancer.  EXAM: CT ANGIOGRAPHY CHEST WITH CONTRAST  TECHNIQUE: Multidetector CT imaging of the chest was performed using the standard protocol during  bolus administration of intravenous contrast. Multiplanar CT image reconstructions and MIPs were obtained to evaluate the vascular anatomy.  CONTRAST:  159m OMNIPAQUE IOHEXOL 350 MG/ML SOLN  COMPARISON:  09/10/2014  FINDINGS: THORACIC INLET/BODY WALL:  Left IJ catheter, tip at the upper SVC.  Right supraclavicular fossa adenopathy difficult to visualize due to contrast timing. No indication of progression.  MEDIASTINUM:  No cardiomegaly. Thick appearance of the left ventricle, probable hypertrophy. Coronary atherosclerosis, extensive at and below the left main bifurcation. No acute aortic finding. When accounting for motion, negative for pulmonary embolism.  Diminished prevascular and paratracheal lymphadenopathy. The paratracheal noticed 15 mm today, previously 20 mm.  LUNG WINDOWS:  There is no edema, consolidation, effusion, or pneumothorax.  Chronic diffuse bronchial wall thickening.  UPPER ABDOMEN:  No acute findings. Left adrenal thickening, not hypermetabolic on recent PET.  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 No acute finding.  Review of the MIP images confirms the above findings.  IMPRESSION: 1. Negative for pulmonary embolism or other acute finding. 2. 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 3. Coronary atherosclerosis. 4. 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   Electronically Signed  By: Monte Fantasia M.D.   On: 10/25/2014 02:22   Dg Chest Portable 1 View  10/25/2014   CLINICAL DATA:  Generalized body aches, heartburn symptoms for 3 days. Chest pain, back pain and headache. On weekly chemo radiation. History of lung cancer.  EXAM: PORTABLE CHEST 1 VIEW  COMPARISON:  Chest radiograph September 08, 2014  FINDINGS: Cardiomediastinal silhouette is normal. Strandy densities LEFT lung base.The lungs are clear without pleural effusions or focal consolidations.  Single lumen LEFT chest Port-A-Cath with distal tip projecting in proximal superior vena cava. Trachea projects midline and there is no pneumothorax. Soft tissue planes and included osseous structures are non-suspicious.  IMPRESSION: LEFT lung base atelectasis.   Electronically Signed   By: Elon Alas M.D.   On: 10/25/2014 00:36    Medications: I have reviewed the patient's current medications.  Assesment:   Principal Problem:   Chest pain Active Problems:   Essential hypertension   Bilateral lung cancer (HCC)   Chronic neck pain ? Cellulitis of porta Cath site  Plan:  Medications reviewed Will continue IV antibiotics Will follow culture result Oncology consult      Aishah Teffeteller 10/26/2014, 8:15 AM

## 2014-10-26 NOTE — Care Management Note (Signed)
Case Management Note  Patient Details  Name: Megan Sims MRN: [id number] Date of Birth: 05/11/1955  Expected Discharge Date:     10/29/2014             Expected Discharge Plan:  Home/Self Care  In-House Referral:  NA  Discharge planning Services  CM Consult  Post Acute Care Choice:  NA Choice offered to:  NA  DME Arranged:    DME Agency:     HH Arranged:    HH Agency:     Status of Service:  In process, will continue to follow  Medicare Important Message Given:    Date Medicare IM Given:    Medicare IM give by:    Date Additional Medicare IM Given:    Additional Medicare Important Message give by:     If discussed at [place] of Stay Meetings, dates discussed:    Additional Comments: Pt is from home with self care. Pt admitted with chest pain. Pt transferred to [facility] for oncology care. CM at [facility] to cont to follow.  Sherald Barge, RN 10/26/2014, 1:24 PM

## 2014-10-26 NOTE — Progress Notes (Signed)
[facility] CONSULT NOTE  Patient Care Team: Rosita Fire, MD as PCP - General (Internal Medicine) Heath Lark, MD as Consulting Physician (Hematology and Oncology) Jodi Marble, MD as Consulting Physician (Otolaryngology) Eppie Gibson, MD as Attending Physician (Radiation Oncology)  CHIEF COMPLAINTS/PURPOSE OF CONSULTATION:  Direct transfer from [facility] hospital to [facility] for management of lung cancer patient  HISTORY OF PRESENTING ILLNESS:  Megan Sims 59 y.o. female is well known to me. The patient have chronic pain syndrome from chronic neck pain. Summary of oncologic history is as follows: Oncology History   Bilateral lung cancer   Staging form: Lung, AJCC 6th Edition     Clinical stage from 10/01/2014: Stage IIIB (T4(m), N3, M0) - Signed by Heath Lark, MD on 10/01/2014 Cancer of upper lobe of right lung   Staging form: Lung, AJCC 7th Edition     Clinical: Stage Unknown ([place], N3, M0) - Signed by Eppie Gibson, MD on 10/07/2014       Bilateral lung cancer ([place])   09/05/2014 - 09/12/2014 Hospital Admission The patient was admitted to the hospital for further management of possible skin reaction, dysphagia and supraclavicular lymphadenopathy   09/06/2014 Imaging CT scan of the neck showed malignant appearing superior mediastinal and right thoracic inlet lymphadenopathy   09/10/2014 Imaging CT scan of the abdomen and pelvis confirmed right supraclavicular and mediastinal lymphadenopathy, as described above but no other evidence of metastatic cancer    09/14/2014 Procedure Flexible endoscopy in the ENT office failed to reveal any primary in the head and neck region   09/16/2014 Imaging She failed barium swallow. 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 She rated her pain as severe. At rest, she is not in pain. There is also no return of blood when her port was accessed. She denies side effects from recent chemotherapy treatment. Denies fevers, chills or cough. Since admission, blood cultures were negative. Echocardiogram was within normal limits. She had poorly controlled hypertension which is well known. CT angiogram show no evidence of blood clot or pneumonia.  MEDICAL HISTORY:  Past Medical History  Diagnosis Date  . Migraine   . Lung abnormality   . Hypertension   . Pleurisy   . Cerebral aneurysm 2 brain surgeries 96 or 97  . Paget disease of bone   . Diarrhea   . Renal insufficiency     Patient states " no kidney problems  . Malignant neoplasm of unknown origin ([place])   . S/P biopsy     of throat per patient.  . Oral thrush 09/24/2014  . Nicotine dependence 09/24/2014  . Chronic neck pain 09/24/2014  . Cancer [facility])     malignant neoplasm of unknown origin    SURGICAL HISTORY: Past Surgical History  Procedure Laterality Date  .  Chest tube insertion    . Tubal ligation    . Cerebral aneurysm repair  96 or 97  . Colonoscopy      SOCIAL HISTORY: Social History   Social History  . Marital Status: Married    Spouse Name: N/A  . Number of Children: N/A  . Years of Education: N/A   Occupational History  . Not on file.   Social History Main Topics  . Smoking status: Current Every  Day Smoker -- 0.50 packs/day for 34 years    Types: Cigarettes  . Smokeless tobacco: Never Used  . Alcohol Use: No  . Drug Use: No  . Sexual Activity: Not on file   Other Topics Concern  . Not on file   Social History Narrative    FAMILY HISTORY: Family History  Problem Relation Age of Onset  . Hypertension    . Diabetes    . Kidney disease    . Cancer Mother     throat ca  . Cancer Maternal Grandmother     thyroid ca    ALLERGIES:  is allergic to hydrocodone-acetaminophen.  MEDICATIONS:  Current Facility-Administered Medications  Medication Dose Route Frequency Provider Last Rate Last Dose  . acetaminophen (TYLENOL) tablet 650 mg  650 mg Oral Q4H PRN Phillips Grout, MD      . amLODipine (NORVASC) tablet 10 mg  10 mg Oral Daily Phillips Grout, MD   10 mg at 10/26/14 0906  . aspirin EC tablet 325 mg  325 mg Oral Daily Phillips Grout, MD   325 mg at 10/26/14 0906  . carvedilol (COREG) tablet 6.25 mg  6.25 mg Oral BID WC Phillips Grout, MD   6.25 mg at 10/25/14 1659  . cefTRIAXone (ROCEPHIN) 1 g in dextrose 5 % 50 mL IVPB  1 g Intravenous Q24H Phillips Grout, MD   1 g at 10/26/14 0528  . gi cocktail (Maalox,Lidocaine,Donnatal)  30 mL Oral QID PRN Phillips Grout, MD   30 mL at 10/25/14 2030  . lisinopril (PRINIVIL,ZESTRIL) tablet 20 mg  20 mg Oral Q24H Rosita Fire, MD      . morphine (MSIR) tablet 15 mg  15 mg Oral Q6H PRN Phillips Grout, MD   15 mg at 10/26/14 0905  . ondansetron (ZOFRAN) injection 4 mg  4 mg Intravenous Q6H PRN Phillips Grout, MD   4 mg at 10/25/14 1659  . oxyCODONE (Oxy IR/ROXICODONE) immediate release tablet 2.5 mg  2.5 mg Oral Q4H PRN Phillips Grout, MD      . polyethylene glycol (MIRALAX / GLYCOLAX) packet 17 g  17 g Oral Daily Phillips Grout, MD   17 g at 10/25/14 1119  . prochlorperazine (COMPAZINE) tablet 10 mg  10 mg Oral Q6H PRN Phillips Grout, MD        REVIEW OF SYSTEMS:   Constitutional: Denies fevers, chills or abnormal night sweats Eyes:  Denies blurriness of vision, double vision or watery eyes Ears, nose, mouth, throat, and face: Denies mucositis or sore throat Respiratory: Denies cough, dyspnea or wheezes Gastrointestinal:  Denies nausea, heartburn or change in bowel habits Skin: Denies abnormal skin rashes Lymphatics: Denies new lymphadenopathy or easy bruising Neurological:Denies numbness, tingling or new weaknesses Behavioral/Psych: Mood is stable, no new changes  All other systems were reviewed with the patient and are negative.  PHYSICAL EXAMINATION: ECOG PERFORMANCE STATUS: 1 - Symptomatic but completely ambulatory  Filed Vitals:   10/26/14 1519  BP: 136/67  Pulse: 80  Temp: 97.5 F (36.4 C)  Resp: 18   Filed Weights   10/25/14 0445  Weight: 119 lb 4.8 oz (54.114 kg)    GENERAL:alert, no distress and comfortable. She looks thin but not cachectic SKIN: skin color, texture, turgor are normal, no rashes or significant lesions EYES: normal, conjunctiva are pink and non-injected, sclera clear OROPHARYNX:no exudate, no erythema and lips, buccal mucosa, and tongue normal  NECK: supple, thyroid normal size, non-tender, without nodularity LYMPH:  no palpable lymphadenopathy in the cervical, axillary or inguinal LUNGS: clear to auscultation and percussion with normal breathing effort HEART: regular rate & rhythm and no murmurs and no lower extremity edema ABDOMEN:abdomen soft, non-tender and normal bowel sounds Musculoskeletal:no cyanosis of digits and no clubbing . There is poor over the left chest wall region, without any fluctuance or tenderness to touch PSYCH: alert & oriented x 3 with fluent speech NEURO: no focal motor/sensory deficits  LABORATORY DATA:  I have reviewed the data as listed Lab Results  Component Value Date   WBC 5.5 10/25/2014   HGB 12.9 10/25/2014   HCT 38.0 10/25/2014   MCV 90.7 10/25/2014   PLT 301 10/25/2014    Recent Labs  09/12/14 0450 10/08/14 1210  10/12/14 0941  10/20/14 1054 10/25/14 0021 10/25/14 0032  NA 135 141  < > 141 140 137 141  K 3.8 3.7  < > 4.0 4.1 3.6 3.6  CL 97* 106  --   --   --  108 106  CO2 28 26  --  '26 27 23  '$ --   GLUCOSE 136* 99  < > 117 94 102* 101*  BUN 21* 12  < > 12.6 15.'2 16 15  '$ CREATININE 0.88 0.96  < > 0.9 0.8 0.77 0.80  CALCIUM 8.6* 8.9  --  9.1 9.3 8.4*  --   GFRNONAA >60 >60  --   --   --  >60  --   GFRAA >60 >60  --   --   --  >60  --   PROT 5.8*  --   --  6.8 6.6 6.6  --   ALBUMIN 3.1*  --   --  3.2* 3.2* 3.4*  --   AST 41  --   --  '17 19 20  '$ --   ALT 59*  --   --  9 10 11*  --   ALKPHOS 135*  --   --  149 145 123  --   BILITOT 0.7  --   --  0.25 <0.30 0.5  --   < > = values in this interval not displayed.  RADIOGRAPHIC STUDIES: I have personally reviewed the radiological images as listed and agreed with the findings in the report. Ct Head Wo Contrast  10/25/2014   CLINICAL DATA:  Weakness and intermittent headache  EXAM: CT HEAD WITHOUT CONTRAST  TECHNIQUE: Contiguous axial images were obtained from the base of the skull through the vertex without intravenous contrast.  COMPARISON:  Brain MRI 10/02/2014  FINDINGS: Skull and Sinuses:Remote bilateral pterional craniotomy for aneurysm clipping.  Remote blowout fracture of the medial wall right orbit.  No acute finding or destructive process. No sinusitis or mastoiditis  Orbits: No acute abnormality.  Brain: No evidence of acute infarction, hemorrhage, hydrocephalus, or mass lesion/mass effect.  Aneurysm clips around the bilateral circle-of-Willis have stable orientation.  Gliosis in the right temporal pole, likely from surgical access, is stable. Stable pattern of advanced for age chronic small  vessel disease, patchy throughout the bilateral cerebral white matter and underestimated compared MRI. 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  IMPRESSION: 1. No acute finding. 2. Chronic small vessel disease and sequela of bilateral aneurysm clipping.    Electronically Signed   By: Monte Fantasia M.D.   On: 10/25/2014 04:04   Ct Angio Chest Pe W/cm &/or Wo Cm  10/25/2014   CLINICAL DATA:  Chest pain and tachycardia.  Lung cancer.  EXAM: CT ANGIOGRAPHY CHEST WITH CONTRAST  TECHNIQUE: Multidetector CT imaging of the chest was performed using the standard protocol during bolus administration of intravenous contrast. Multiplanar CT image reconstructions and MIPs were obtained to evaluate the vascular anatomy.  CONTRAST:  14m OMNIPAQUE IOHEXOL 350 MG/ML SOLN  COMPARISON:  09/10/2014  FINDINGS: THORACIC INLET/BODY WALL:  Left IJ catheter, tip at the upper SVC.  Right supraclavicular fossa adenopathy difficult to visualize due to contrast timing. No indication of progression.  MEDIASTINUM:  No cardiomegaly. Thick appearance of the left ventricle, probable hypertrophy. Coronary atherosclerosis, extensive at and below the left main bifurcation. No acute aortic finding. When accounting for motion, negative for pulmonary embolism.  Diminished prevascular and paratracheal lymphadenopathy. The paratracheal noticed 15 mm today, previously 20 mm.  LUNG WINDOWS:  There is no edema, consolidation, effusion, or pneumothorax.  Chronic diffuse bronchial wall thickening.  UPPER ABDOMEN:  No acute findings. Left adrenal thickening, not hypermetabolic on recent PET.  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 No acute finding.  Review of the MIP images confirms the above findings.  IMPRESSION: 1. Negative for pulmonary embolism or other acute finding. 2. 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 3. Coronary atherosclerosis. 4. 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   Electronically Signed   By: JMonte FantasiaM.D.   On: 10/25/2014 02:22   Mr BJeri CosWMBContrast  10/03/2014   CLINICAL DATA:  New diagnosis of bilateral lung cancer. Personal  history of bilateral craniotomies with aneurysm clips.  EXAM: MRI HEAD WITHOUT AND WITH CONTRAST  TECHNIQUE: Multiplanar, multiecho pulse sequences of the brain and surrounding structures were obtained without and with intravenous contrast.  CONTRAST:  10 mL MultiHance  COMPARISON:  CT head without contrast 03/14/2012.  FINDINGS: Bilateral pterional craniotomies are present for ICA aneurysm clips. These clips create moderate artifact on some sequences.  Extensive atrophy and white matter disease is advanced for age. A remote left paramedian pontine infarct is noted. Remote lacunar infarcts are present in the thalami, more prominent on the right. A remote left caudate head infarct is present.  No acute hemorrhage is present.  The ventricles are of normal size.  The postcontrast images demonstrate no pathologic enhancement. There is no evidence for metastatic disease of the brain or meninges. Artifact from the aneurysm clips obscures only minimal portions of the medial temporal lobes bilaterally.  Flow is present in the visible major intracranial arteries. The globes and orbits are intact. A remote right medial blowout fracture is noted in the right orbit. The paranasal sinuses and mastoid air cells are clear.  IMPRESSION: 1. No evidence for metastatic disease to the brain. 2. Advanced atrophy and white matter disease compatible with chronic microvascular ischemia. 3. Remote lacunar infarcts of the basal ganglia bilaterally as well as the left paramedian pons. 4. Bilateral pterional craniotomies and ICA aneurysm clips are stable.   Electronically Signed  By: San Morelle M.D.   On: 10/03/2014 11:53   Nm Pet Image Initial (pi) Skull Base To Thigh  09/29/2014   CLINICAL DATA:  Initial treatment strategy for metastatic carcinoma with squamous differentiation, primary site uncertain.  EXAM: NUCLEAR MEDICINE PET SKULL BASE TO THIGH  TECHNIQUE: 11.5 mCi F-18 FDG was injected intravenously. Full-ring PET imaging  was performed from the skull base to thigh after the radiotracer. CT data was obtained and used for attenuation correction and anatomic localization.  FASTING BLOOD GLUCOSE:  Value: 86 mg/dl  COMPARISON:  Multiple exams, including 09/10/2014  FINDINGS: NECK  Nonspecific activity along the adenoids mildly hypermetabolic with maximum standard uptake value 5.1. Right supraclavicular lymph node just behind the clavicle measuring 0.7 cm in short axis on image 60 series 4 has maximum standard uptake value 8.2. There is adjacent gas in the soft tissues likely from recent surgical biopsy. Clips are noted lateral to the cavernous sinuses bilaterally.  CHEST  Hypermetabolic mediastinal adenopathy. Right prevascular node measuring 2.0 cm in short axis on image 78 series 4 has maximum standard uptake value 18.6. Right paratracheal node measuring 2.0 cm in short axis on image 81 series 4 has maximum standard uptake value 19.7. Left eccentric prevascular node measuring 2.2 cm in short axis on image 80 series 4 has maximum standard uptake value 15.6. A confluence of ill-defined ground-glass opacity in the right upper lobe centrally has a maximum standard uptake value of 2.8. Right hilar hypermetabolic node with maximum standard uptake value 14.2 noted.  ABDOMEN/PELVIS  No abnormal hypermetabolic activity within the liver, pancreas, adrenal glands, or spleen. No hypermetabolic lymph nodes in the abdomen or pelvis. Aortoiliac atherosclerotic vascular disease. No abnormal anorectal activity identified.  SKELETON  Pagetoid appearance of T11, L1, L5, and of the entire bony pelvis. This is not accompanied by significant hypermetabolic bony activity.  Prior bilateral craniotomies.  IMPRESSION: 1. Hypermetabolic mediastinal and right hilar adenopathy. Very faint ground-glass opacity in the right upper lobe with low-grade hypermetabolic activity, conceivably malignant with maximum standard uptake value of 2.8, but seemingly unlikely to be  the source of all of this prominent adenopathy in the chest given how ill-defined and sub solid the pulmonary nodularity as. 2. Low-level focus of activity centrally along the adenoids, maximum standard uptake value 5.1, probably from residual active lymphoid tissue rather than as a primary source of the disease elsewhere in the chest. 3. Postoperative findings adjacent to a hypermetabolic right supraclavicular lymph node. 4. Paget's disease in the lower thoracic spine, parts of the lumbar spine, and bony pelvis.   Electronically Signed   By: Van Clines M.D.   On: 09/29/2014 11:01   Ir Fluoro Guide Cv Line Right  10/08/2014   CLINICAL DATA:  Metastatic adenocarcinoma, needs venous access for chemotherapy. Recent right neck surgical biopsy.  EXAM: TUNNELED PORT CATHETER PLACEMENT WITH ULTRASOUND AND FLUOROSCOPIC GUIDANCE  FLUOROSCOPY TIME:  42 seconds, 3 mGy  ANESTHESIA/SEDATION: Intravenous Fentanyl and Versed were administered as conscious sedation during continuous cardiorespiratory monitoring by the radiology RN, with a total moderate sedation time of less than 30 minutes.  TECHNIQUE: The procedure, risks, benefits, and alternatives were explained to the patient. Questions regarding the procedure were encouraged and answered. The patient understands and consents to the procedure. As antibiotic prophylaxis, cefazolin 2 g was ordered pre-procedure and administered intravenously within one hour of incision. Patency of the left IJ vein was confirmed with ultrasound with image documentation. An appropriate skin site was determined. Skin site  was marked. Region was prepped using maximum barrier technique including cap and mask, sterile gown, sterile gloves, large sterile sheet, and Chlorhexidine as cutaneous antisepsis. The region was infiltrated locally with 1% lidocaine. Under real-time ultrasound guidance, the left IJ vein was accessed with a 21 gauge micropuncture needle; the needle tip within the vein  was confirmed with ultrasound image documentation. Needle was exchanged over a 018 guidewire for transitional dilator which allowed passage of the [facility] wire into the IVC. Over this, the transitional dilator was exchanged for a 5 [place] MPA catheter. A small incision was made on the left anterior chest wall and a subcutaneous pocket fashioned. The power-injectable port was positioned and its catheter tunneled to the left IJ dermatotomy site. The MPA catheter was exchanged over an Amplatz wire for a peel-away sheath, through which the port catheter, which had been trimmed to the appropriate length, was advanced and positioned under fluoroscopy with its tip at the cavoatrial junction. Spot chest radiograph confirms good catheter position and no pneumothorax. The pocket was closed with deep interrupted and subcuticular continuous 3-0 Monocryl sutures. The port was flushed per protocol. The incisions were covered with Dermabond then covered with a sterile dressing.  COMPLICATIONS: COMPLICATIONS None immediate  IMPRESSION: Technically successful left IJ power-injectable port catheter placement. Ready for routine use.   Electronically Signed   By: Lucrezia Europe M.D.   On: 10/08/2014 15:17   Ir US Guide Vasc Access Right  10/08/2014   CLINICAL DATA:  Metastatic adenocarcinoma, needs venous access for chemotherapy. Recent right neck surgical biopsy.  EXAM: TUNNELED PORT CATHETER PLACEMENT WITH ULTRASOUND AND FLUOROSCOPIC GUIDANCE  FLUOROSCOPY TIME:  42 seconds, 3 mGy  ANESTHESIA/SEDATION: Intravenous Fentanyl and Versed were administered as conscious sedation during continuous cardiorespiratory monitoring by the radiology RN, with a total moderate sedation time of less than 30 minutes.  TECHNIQUE: The procedure, risks, benefits, and alternatives were explained to the patient. Questions regarding the procedure were encouraged and answered. The patient understands and consents to the procedure. As antibiotic prophylaxis,  cefazolin 2 g was ordered pre-procedure and administered intravenously within one hour of incision. Patency of the left IJ vein was confirmed with ultrasound with image documentation. An appropriate skin site was determined. Skin site was marked. Region was prepped using maximum barrier technique including cap and mask, sterile gown, sterile gloves, large sterile sheet, and Chlorhexidine as cutaneous antisepsis. The region was infiltrated locally with 1% lidocaine. Under real-time ultrasound guidance, the left IJ vein was accessed with a 21 gauge micropuncture needle; the needle tip within the vein was confirmed with ultrasound image documentation. Needle was exchanged over a 018 guidewire for transitional dilator which allowed passage of the [facility] wire into the IVC. Over this, the transitional dilator was exchanged for a 5 [place] MPA catheter. A small incision was made on the left anterior chest wall and a subcutaneous pocket fashioned. The power-injectable port was positioned and its catheter tunneled to the left IJ dermatotomy site. The MPA catheter was exchanged over an Amplatz wire for a peel-away sheath, through which the port catheter, which had been trimmed to the appropriate length, was advanced and positioned under fluoroscopy with its tip at the cavoatrial junction. Spot chest radiograph confirms good catheter position and no pneumothorax. The pocket was closed with deep interrupted and subcuticular continuous 3-0 Monocryl sutures. The port was flushed per protocol. The incisions were covered with Dermabond then covered with a sterile dressing.  COMPLICATIONS: COMPLICATIONS None immediate  IMPRESSION: Technically successful left  IJ power-injectable port catheter placement. Ready for routine use.   Electronically Signed   By: Lucrezia Europe M.D.   On: 10/08/2014 15:17   Dg Chest Portable 1 View  10/25/2014   CLINICAL DATA:  Generalized body aches, heartburn symptoms for 3 days. Chest pain, back pain and  headache. On weekly chemo radiation. History of lung cancer.  EXAM: PORTABLE CHEST 1 VIEW  COMPARISON:  Chest radiograph September 08, 2014  FINDINGS: Cardiomediastinal silhouette is normal. Strandy densities LEFT lung base.The lungs are clear without pleural effusions or focal consolidations. Single lumen LEFT chest Port-A-Cath with distal tip projecting in proximal superior vena cava. Trachea projects midline and there is no pneumothorax. Soft tissue planes and included osseous structures are non-suspicious.  IMPRESSION: LEFT lung base atelectasis.   Electronically Signed   By: Elon Alas M.D.   On: 10/25/2014 00:36    ASSESSMENT & PLAN:   Lung cancer CT scan show positive response to treatment. Continue radiation therapy. We might have to defer chemotherapy this week pending further evaluation  Chronic neck pain The patient has chronic pain syndrome. We'll continue morphine as needed for now I plan to order MRI as outlined below  Left chest wall pain This could be related to recent port insertion. Clinically, it does not appear that the port is infected. There were no evidence of leukocytosis. No evidence of fluid collection on CT angiogram. She have abnormal lesions on the back and I recommend follow-up with MRI of the cervical spine and MRI of the thoracic spine to exclude bone metastasis.  Recent weight loss Recommend increase food intake and nutritional supplement as tolerated  DVT prophylaxis Recommend Lovenox  Possible malfunctioning port We'll get protocol in place to assess if there are port is patent  Poorly controlled hypertension This could be exacerbated by recent pain Continue current therapy for now  Discharge planning Possibly discharged within the next 24-48 hours if MRI is negative and pain is under control   All questions were answered. The patient knows to call the clinic with any problems, questions or concerns.    [facility], [place], MD 10/26/2014 4:09  PM

## 2014-10-26 NOTE — Progress Notes (Signed)
Transferred to [facility], out in stable condition via stretcher by [place].

## 2014-10-26 NOTE — Consult Note (Addendum)
Patient's chart reviewed.  Oncology consult for possible port-a-cath infection is not appropriate.  Recommend Gen Surg consult as oncology does not treat port-a-cath infection.  Patient's primary oncologist is Dr. Alvy Bimler at [facility]. Patient started concomitant chemoradiation with Carboplatin/Taxol on 9/21 under the guidance of Dr. Alvy Bimler (Med Onc) and Dr. Eppie Gibson (Rad Onc).  Both treatments being done in [place].  If concerns persist, recommend transfer to [facility].  Patient would likely continue radiation therapy despite current infection with holding of systemic chemotherapy until infection resolves, however, I will defer to her primary treating physicians.  Since she is not a patient of [facility], we have no input or treatment recommendations.  If there are questions about radiation therapy, I recommend the patient's attending physician make contact with the patient's radiation oncologist, Dr. Isidore Moos.  Patient not seen, no charge placed.  KEFALAS,THOMAS, PA-C 10/26/2014 9:10 AM

## 2014-10-26 NOTE — Progress Notes (Signed)
Ms. Megan Sims is here for her 8th fraction to her chest.  She is currently in the hospital because she began having chest pain, and flutters Saturday night. She has improved but will transfer to [facility] today. She complains of Left shoulder pain that radiates towards her back at a level 7 which she is taking morphine for. She complains of occasional nausea/ vomiting. She also reports mild fatigue, but is able to complete her normal activities. Her skin is dry and darker in color. BP 113/64 mmHg  Pulse 79  Temp(Src) 98 F (36.7 C)  SpO2 100%  Wt Readings from Last 3 Encounters:  10/25/14 119 lb 4.8 oz (54.114 kg)  10/21/14 119 lb 6.4 oz (54.159 kg)  10/19/14 119 lb 14.4 oz (54.386 kg)

## 2014-10-26 NOTE — Progress Notes (Addendum)
Weekly Management Note:  Inpatient    ICD-9-CM ICD-10-CM   1. Cancer of upper lobe of right lung 162.3 C34.11          Current Dose:  16 Gy  Projected Dose: 60 Gy   Narrative:  The patient presents for routine under treatment assessment.  CBCT/MVCT images/Port film x-rays were reviewed.  The chart was checked. Discussed patient with Dr Legrand Rams. She is getting transferred from [facility] to [facility] ... Recently admitted for Chest pain. CT angio ruled out PE. No clear cause of chest pain. Nodes regressing in chest.  Also has cellultis at P-A-C site.  She described the pain as worse in the mid upper back. Also has some neck pain and progressive numbness of entire L arm.    Physical Findings:  Wt Readings from Last 3 Encounters:  10/25/14 119 lb 4.8 oz (54.114 kg)  10/21/14 119 lb 6.4 oz (54.159 kg)  10/19/14 119 lb 14.4 oz (54.386 kg)    height is '5\' 4"'$  (1.626 m) and weight is 119 lb 4.8 oz (54.114 kg). Her oral temperature is 98.8 F (37.1 C). Her blood pressure is 100/61 and her pulse is 84. Her respiration is 18 and oxygen saturation is 97%.  NAD, lying in gurney, no specific dermatitis from radiotherapy. Tenderness to palpation of upper T spine.  Left hand/arm weakness, moderate.   CBC    Component Value Date/Time   WBC 5.5 10/25/2014 0021   WBC 8.7 10/20/2014 1054   RBC 3.77* 10/25/2014 0021   RBC 3.89 10/20/2014 1054   HGB 12.9 10/25/2014 0032   HGB 11.5* 10/20/2014 1054   HCT 38.0 10/25/2014 0032   HCT 35.5 10/20/2014 1054   PLT 301 10/25/2014 0021   PLT 302 10/20/2014 1054   MCV 90.7 10/25/2014 0021   MCV 91.3 10/20/2014 1054   MCH 30.0 10/25/2014 0021   MCH 29.6 10/20/2014 1054   MCHC 33.0 10/25/2014 0021   MCHC 32.4 10/20/2014 1054   RDW 17.7* 10/25/2014 0021   RDW 17.7* 10/20/2014 1054   LYMPHSABS 0.9 10/25/2014 0021   LYMPHSABS 1.3 10/20/2014 1054   MONOABS 0.7 10/25/2014 0021   MONOABS 0.7 10/20/2014 1054   EOSABS 0.1 10/25/2014 0021   EOSABS 0.1  10/20/2014 1054   BASOSABS 0.0 10/25/2014 0021   BASOSABS 0.0 10/20/2014 1054     CMP     Component Value Date/Time   NA 141 10/25/2014 0032   NA 140 10/20/2014 1054   K 3.6 10/25/2014 0032   K 4.1 10/20/2014 1054   CL 106 10/25/2014 0032   CO2 23 10/25/2014 0021   CO2 27 10/20/2014 1054   GLUCOSE 101* 10/25/2014 0032   GLUCOSE 94 10/20/2014 1054   BUN 15 10/25/2014 0032   BUN 15.2 10/20/2014 1054   CREATININE 0.80 10/25/2014 0032   CREATININE 0.8 10/20/2014 1054   CALCIUM 8.4* 10/25/2014 0021   CALCIUM 9.3 10/20/2014 1054   PROT 6.6 10/25/2014 0021   PROT 6.6 10/20/2014 1054   ALBUMIN 3.4* 10/25/2014 0021   ALBUMIN 3.2* 10/20/2014 1054   AST 20 10/25/2014 0021   AST 19 10/20/2014 1054   ALT 11* 10/25/2014 0021   ALT 10 10/20/2014 1054   ALKPHOS 123 10/25/2014 0021   ALKPHOS 145 10/20/2014 1054   BILITOT 0.5 10/25/2014 0021   BILITOT <0.30 10/20/2014 1054   GFRNONAA >60 10/25/2014 0021   GFRAA >60 10/25/2014 0021     Impression:  The patient is tolerating radiotherapy.  Plan:  Continue radiotherapy as planned.  Recommend MRI of C and T spine with  and w/o contrast for left arm numbness /weakness and upper back/neck pain.  -----------------------------------  Eppie Gibson, MD

## 2014-10-27 ENCOUNTER — Observation Stay (HOSPITAL_COMMUNITY): Payer: Medicaid Other

## 2014-10-27 ENCOUNTER — Other Ambulatory Visit: Payer: Medicaid Other

## 2014-10-27 ENCOUNTER — Ambulatory Visit
Admission: RE | Admit: 2014-10-27 | Discharge: 2014-10-27 | Disposition: A | Discharge status: Home / Self Care | Payer: Medicaid Other | Source: Ambulatory Visit | Attending: Radiation Oncology | Admitting: Radiation Oncology

## 2014-10-27 DIAGNOSIS — M542 Cervicalgia: Secondary | ICD-10-CM | POA: Diagnosis not present

## 2014-10-27 DIAGNOSIS — I10 Essential (primary) hypertension: Secondary | ICD-10-CM | POA: Diagnosis not present

## 2014-10-27 DIAGNOSIS — C3491 Malignant neoplasm of unspecified part of right bronchus or lung: Secondary | ICD-10-CM | POA: Diagnosis not present

## 2014-10-27 DIAGNOSIS — R0789 Other chest pain: Secondary | ICD-10-CM | POA: Diagnosis not present

## 2014-10-27 MED ORDER — GADOBENATE DIMEGLUMINE 529 MG/ML IV SOLN
10.0000 mL | Freq: Once | INTRAVENOUS | Status: AC | PRN
  Administered 2014-10-27: 10 mL via INTRAVENOUS

## 2014-10-27 NOTE — Progress Notes (Signed)
Subjective: Pain improved Awaiting MRI  Objective: Vital signs in last 24 hours: Temp:  [97.5 F (36.4 C)-98.8 F (37.1 C)] 98.5 F (36.9 C) (10/04 0604) Pulse Rate:  [76-92] 76 (10/04 0604) Resp:  [18] 18 (10/04 0604) BP: (100-136)/(39-67) 105/49 mmHg (10/04 0604) SpO2:  [97 %-100 %] 97 % (10/04 0604) Weight change:  Last BM Date: 10/26/14  Intake/Output from previous day: 10/03 0701 - 10/04 0700 In: -  Out: 3 [Urine:3]  PHYSICAL EXAM General appearance: alert and no distress Resp: clear to auscultation bilaterally and tenderness and redness of the porta Cath area Cardio: S1, S2 normal GI: soft, non-tender; bowel sounds normal; no masses,  no organomegaly Extremities: extremities normal, atraumatic, no cyanosis or edema  Lab Results:  Results for orders placed or performed during the hospital encounter of 10/24/14 (from the past 48 hour(s))  Troponin I (q 6hr x 3)     Status: None   Collection Time: 10/25/14 10:36 AM  Result Value Ref Range   Troponin I <0.03 <0.031 ng/mL    Comment:        NO INDICATION OF MYOCARDIAL INJURY.   Troponin I (q 6hr x 3)     Status: None   Collection Time: 10/25/14  4:48 PM  Result Value Ref Range   Troponin I <0.03 <0.031 ng/mL    Comment:        NO INDICATION OF MYOCARDIAL INJURY.     ABGS  Recent Labs  10/25/14 0032  TCO2 22   CULTURES Recent Results (from the past 240 hour(s))  Urine culture     Status: None (Preliminary result)   Collection Time: 10/25/14 12:21 AM  Result Value Ref Range Status   Specimen Description URINE, CLEAN CATCH  Final   Special Requests NONE  Final   Culture   Final    TOO YOUNG TO READ Performed at [facility]    Report Status PENDING  Incomplete  Blood culture (routine x 2)     Status: None (Preliminary result)   Collection Time: 10/25/14  2:39 AM  Result Value Ref Range Status   Specimen Description BLOOD RIGHT ANTECUBITAL  Final   Special Requests BOTTLES DRAWN  AEROBIC AND ANAEROBIC Scissors  Final   Culture NO GROWTH 2 DAYS  Final   Report Status PENDING  Incomplete  Blood culture (routine x 2)     Status: None (Preliminary result)   Collection Time: 10/25/14  2:47 AM  Result Value Ref Range Status   Specimen Description RIGHT ANTECUBITAL  Final   Special Requests BOTTLES DRAWN AEROBIC AND ANAEROBIC 8CC  Final   Culture NO GROWTH 2 DAYS  Final   Report Status PENDING  Incomplete   Studies/Results: No results found.   Current facility-administered medications:  .  acetaminophen (TYLENOL) tablet 650 mg, 650 mg, Oral, Q4H PRN, Phillips Grout, MD .  amLODipine (NORVASC) tablet 10 mg, 10 mg, Oral, Daily, Phillips Grout, MD, 10 mg at 10/27/14 0959 .  aspirin EC tablet 325 mg, 325 mg, Oral, Daily, Phillips Grout, MD, 325 mg at 10/27/14 0959 .  carvedilol (COREG) tablet 6.25 mg, 6.25 mg, Oral, BID WC, Phillips Grout, MD, 6.25 mg at 10/27/14 0816 .  cefTRIAXone (ROCEPHIN) 1 g in dextrose 5 % 50 mL IVPB, 1 g, Intravenous, Q24H, Phillips Grout, MD, 1 g at 10/27/14 0629 .  enoxaparin (LOVENOX) injection 40 mg, 40 mg, Subcutaneous, Q24H, Ni Gorsuch, MD, 40 mg at 10/26/14 1802 .  gi cocktail (Maalox,Lidocaine,Donnatal), 30 mL, Oral, QID PRN, Phillips Grout, MD, 30 mL at 10/25/14 2030 .  lisinopril (PRINIVIL,ZESTRIL) tablet 20 mg, 20 mg, Oral, Q24H, Rosita Fire, MD, 20 mg at 10/26/14 2025 .  morphine (MSIR) tablet 15 mg, 15 mg, Oral, Q6H PRN, Phillips Grout, MD, 15 mg at 10/26/14 2025 .  ondansetron (ZOFRAN) injection 4 mg, 4 mg, Intravenous, Q6H PRN, Phillips Grout, MD, 4 mg at 10/25/14 1659 .  oxyCODONE (Oxy IR/ROXICODONE) immediate release tablet 2.5 mg, 2.5 mg, Oral, Q4H PRN, Phillips Grout, MD .  polyethylene glycol (MIRALAX / GLYCOLAX) packet 17 g, 17 g, Oral, Daily, Phillips Grout, MD, 17 g at 10/27/14 0959 .  prochlorperazine (COMPAZINE) tablet 10 mg, 10 mg, Oral, Q6H PRN, Phillips Grout, MD  Assesment:   Principal Problem:   Chest pain Active  Problems:   Essential hypertension   Bilateral lung cancer (HCC)   Chronic neck pain   Lung cancer CT scan show positive response to treatment. Continue radiation therapy. defer chemotherapy to   Chronic neck pain The patient has chronic pain syndrome. We'll continue morphine as needed for now MRI pending  Left chest wall pain This could be related to recent port insertion. Clinically, it does not appear that the port is infected. - no evidence of leukocytosis. No evidence of fluid collection on CT angiogram. - MRI of the cervical spine and MRI of the thoracic spine to exclude bone metastasis. Alvy Bimler and Isidore Moos consulted  Recent weight loss -supplement  Poorly controlled hypertension ? from pain Continue current therapy for now       [facility], Antara Brecheisen 10/27/2014, 10:27 AM [phone]

## 2014-10-27 NOTE — Progress Notes (Signed)
Megan Sims   DOB:30-Jul-1955   PN#:[id number]    Subjective: She feels better. Port is working. Left sided chest pain has improved Objective:  Filed Vitals:   10/27/14 1309  BP:   Pulse: 82  Temp: 98.3 F (36.8 C)  Resp: 16     Intake/Output Summary (Last 24 hours) at 10/27/14 1859 Last data filed at 10/27/14 1309  Gross per 24 hour  Intake    480 ml  Output      3 ml  Net    477 ml    GENERAL:alert, no distress and comfortable SKIN: skin color, texture, turgor are normal, no rashes or significant lesions EYES: normal, Conjunctiva are pink and non-injected, sclera clear Musculoskeletal:no cyanosis of digits and no clubbing  NEURO: alert & oriented x 3 with fluent speech, no focal motor/sensory deficits   Labs:  Lab Results  Component Value Date   WBC 5.5 10/25/2014   HGB 12.9 10/25/2014   HCT 38.0 10/25/2014   MCV 90.7 10/25/2014   PLT 301 10/25/2014   NEUTROABS 3.8 10/25/2014    Lab Results  Component Value Date   NA 141 10/25/2014   K 3.6 10/25/2014   CL 106 10/25/2014   CO2 23 10/25/2014    Studies:  Mr Cervical Spine Wo Contrast  10/27/2014   CLINICAL DATA:  Neck pain radiating to the left shoulder and arm with left hand numbness. History of lung cancer. Evaluate for metastatic disease. Subsequent encounter.  EXAM: MRI CERVICAL WITHOUT CONTRAST  MRI THORACIC SPINE WITHOUT AND WITH CONTRAST  TECHNIQUE: Multiplanar and multiecho pulse sequences of the cervical spine, to include the craniocervical junction and cervicothoracic junction, and the thoracic spine, were obtained without intravenous contrast. Postcontrast imaging of the thoracic spine obtained.  CONTRAST:  CONTRAST 10 ML MULTIHANCE.  COMPARISON:  MRI brain 10/02/2014. PET-CT 09/29/2014. Chest CT 10/25/2014.  FINDINGS: MRI CERVICAL SPINE FINDINGS  The cervical alignment is normal. There is no evidence of metastatic disease or fracture. No progressive supraclavicular lymphadenopathy identified.  There are small  retention cysts within the nasopharynx. The paraspinal soft tissues otherwise appear normal.  Mild disc bulging is present at C5-6 and C6-7. No disc herniation, cord deformity or significant foraminal compromise demonstrated.  MRI THORACIC SPINE FINDINGS  There are no suspicious osseous lesions within the thoracic spine. Marrow changes within the T11 and L1 vertebral bodies are associated with coarsened trabecula and osseous expansion, better seen on CT, and consistent with Paget's disease. There is a small T1 hyperintense lesion in the superior endplate of T7, probably a hemangioma.  The thoracic cord is normal in signal and caliber. No abnormal intradural enhancement seen.  Known mediastinal adenopathy is grossly stable. There is a superficial lipoma posteriorly in the upper left back.  No disc herniation, spinal stenosis or nerve root encroachment identified.  IMPRESSION: 1. No evidence of metastatic disease within the cervical or thoracic spine. 2. 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 3. No evidence of disc herniation, spinal stenosis or nerve root encroachment.   Electronically Signed   By: Richardean Sale M.D.   On: 10/27/2014 16:01   Mr Thoracic Spine W Wo Contrast  10/27/2014   CLINICAL DATA:  Neck pain radiating to the left shoulder and arm with left hand numbness. History of lung cancer. Evaluate for metastatic disease. Subsequent encounter.  EXAM: MRI CERVICAL WITHOUT CONTRAST  MRI THORACIC SPINE WITHOUT AND WITH CONTRAST  TECHNIQUE: Multiplanar and multiecho pulse  sequences of the cervical spine, to include the craniocervical junction and cervicothoracic junction, and the thoracic spine, were obtained without intravenous contrast. Postcontrast imaging of the thoracic spine obtained.  CONTRAST:  CONTRAST 10 ML MULTIHANCE.  COMPARISON:  MRI brain 10/02/2014. PET-CT 09/29/2014. Chest CT 10/25/2014.  FINDINGS: MRI CERVICAL SPINE FINDINGS  The cervical  alignment is normal. There is no evidence of metastatic disease or fracture. No progressive supraclavicular lymphadenopathy identified.  There are small retention cysts within the nasopharynx. The paraspinal soft tissues otherwise appear normal.  Mild disc bulging is present at C5-6 and C6-7. No disc herniation, cord deformity or significant foraminal compromise demonstrated.  MRI THORACIC SPINE FINDINGS  There are no suspicious osseous lesions within the thoracic spine. Marrow changes within the T11 and L1 vertebral bodies are associated with coarsened trabecula and osseous expansion, better seen on CT, and consistent with Paget's disease. There is a small T1 hyperintense lesion in the superior endplate of T7, probably a hemangioma.  The thoracic cord is normal in signal and caliber. No abnormal intradural enhancement seen.  Known mediastinal adenopathy is grossly stable. There is a superficial lipoma posteriorly in the upper left back.  No disc herniation, spinal stenosis or nerve root encroachment identified.  IMPRESSION: 1. No evidence of metastatic disease within the cervical or thoracic spine. 2. 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 3. No evidence of disc herniation, spinal stenosis or nerve root encroachment.   Electronically Signed   By: Richardean Sale M.D.   On: 10/27/2014 16:01    Assessment & Plan:  Lung cancer CT scan show positive response to treatment. Continue radiation therapy. We might have to defer chemotherapy this week   Chronic neck pain The patient has chronic pain syndrome. We'll continue morphine as needed for now MRI negative for bone mets  Left chest wall pain This could be related to recent port insertion. Clinically, it does not appear that the port is infected. There were no evidence of leukocytosis. No evidence of fluid collection on CT angiogram. Pain is improved  Recent weight loss Recommend increase food  intake and nutritional supplement as tolerated  DVT prophylaxis Recommend Lovenox  Poorly controlled hypertension This could be exacerbated by recent pain Continue current therapy for now  Discharge planning Tomorrow  Alvy Bimler, Jaxyn Rout, MD 10/27/2014  6:59 PM

## 2014-10-28 ENCOUNTER — Ambulatory Visit: Payer: Medicaid Other | Admitting: Hematology and Oncology

## 2014-10-28 ENCOUNTER — Ambulatory Visit
Admission: RE | Admit: 2014-10-28 | Discharge: 2014-10-28 | Disposition: A | Discharge status: Home / Self Care | Payer: Medicaid Other | Source: Ambulatory Visit | Attending: Radiation Oncology | Admitting: Radiation Oncology

## 2014-10-28 ENCOUNTER — Ambulatory Visit: Payer: Medicaid Other

## 2014-10-28 ENCOUNTER — Other Ambulatory Visit: Payer: Self-pay | Admitting: Internal Medicine

## 2014-10-28 DIAGNOSIS — M542 Cervicalgia: Secondary | ICD-10-CM | POA: Diagnosis not present

## 2014-10-28 DIAGNOSIS — C3491 Malignant neoplasm of unspecified part of right bronchus or lung: Secondary | ICD-10-CM | POA: Diagnosis not present

## 2014-10-28 DIAGNOSIS — C3492 Malignant neoplasm of unspecified part of left bronchus or lung: Secondary | ICD-10-CM

## 2014-10-28 DIAGNOSIS — R0789 Other chest pain: Secondary | ICD-10-CM

## 2014-10-28 DIAGNOSIS — I10 Essential (primary) hypertension: Secondary | ICD-10-CM

## 2014-10-28 DIAGNOSIS — G8929 Other chronic pain: Secondary | ICD-10-CM

## 2014-10-28 LAB — URINE CULTURE

## 2014-10-28 MED ORDER — HEPARIN SOD (PORK) LOCK FLUSH 100 UNIT/ML IV SOLN
500.0000 [IU] | INTRAVENOUS | Status: DC | PRN
  Filled 2014-10-28: qty 5

## 2014-10-28 NOTE — Progress Notes (Signed)
Megan Sims   DOB:03/24/1955   TO#:[id number]    Subjective: She feels better. The left sided chest pain is resolved. She continues to have chronic neck pain.  Objective:  Filed Vitals:   10/28/14 0520  BP: 128/57  Pulse: 72  Temp: 97.9 F (36.6 C)  Resp: 18     Intake/Output Summary (Last 24 hours) at 10/28/14 0924 Last data filed at 10/27/14 1309  Gross per 24 hour  Intake    480 ml  Output      0 ml  Net    480 ml    GENERAL:alert, no distress and comfortable SKIN: skin color, texture, turgor are normal, no rashes or significant lesions EYES: normal, Conjunctiva are pink and non-injected, sclera clear Musculoskeletal:no cyanosis of digits and no clubbing  NEURO: alert & oriented x 3 with fluent speech, no focal motor/sensory deficits   Labs:  Lab Results  Component Value Date   WBC 5.5 10/25/2014   HGB 12.9 10/25/2014   HCT 38.0 10/25/2014   MCV 90.7 10/25/2014   PLT 301 10/25/2014   NEUTROABS 3.8 10/25/2014    Lab Results  Component Value Date   NA 141 10/25/2014   K 3.6 10/25/2014   CL 106 10/25/2014   CO2 23 10/25/2014    Studies:  Mr Cervical Spine Wo Contrast  10/27/2014   CLINICAL DATA:  Neck pain radiating to the left shoulder and arm with left hand numbness. History of lung cancer. Evaluate for metastatic disease. Subsequent encounter.  EXAM: MRI CERVICAL WITHOUT CONTRAST  MRI THORACIC SPINE WITHOUT AND WITH CONTRAST  TECHNIQUE: Multiplanar and multiecho pulse sequences of the cervical spine, to include the craniocervical junction and cervicothoracic junction, and the thoracic spine, were obtained without intravenous contrast. Postcontrast imaging of the thoracic spine obtained.  CONTRAST:  CONTRAST 10 ML MULTIHANCE.  COMPARISON:  MRI brain 10/02/2014. PET-CT 09/29/2014. Chest CT 10/25/2014.  FINDINGS: MRI CERVICAL SPINE FINDINGS  The cervical alignment is normal. There is no evidence of metastatic disease or fracture. No progressive supraclavicular  lymphadenopathy identified.  There are small retention cysts within the nasopharynx. The paraspinal soft tissues otherwise appear normal.  Mild disc bulging is present at C5-6 and C6-7. No disc herniation, cord deformity or significant foraminal compromise demonstrated.  MRI THORACIC SPINE FINDINGS  There are no suspicious osseous lesions within the thoracic spine. Marrow changes within the T11 and L1 vertebral bodies are associated with coarsened trabecula and osseous expansion, better seen on CT, and consistent with Paget's disease. There is a small T1 hyperintense lesion in the superior endplate of T7, probably a hemangioma.  The thoracic cord is normal in signal and caliber. No abnormal intradural enhancement seen.  Known mediastinal adenopathy is grossly stable. There is a superficial lipoma posteriorly in the upper left back.  No disc herniation, spinal stenosis or nerve root encroachment identified.  IMPRESSION: 1. No evidence of metastatic disease within the cervical or thoracic spine. 2. 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 3. No evidence of disc herniation, spinal stenosis or nerve root encroachment.   Electronically Signed   By: Richardean Sale M.D.   On: 10/27/2014 16:01   Mr Thoracic Spine W Wo Contrast  10/27/2014   CLINICAL DATA:  Neck pain radiating to the left shoulder and arm with left hand numbness. History of lung cancer. Evaluate for metastatic disease. Subsequent encounter.  EXAM: MRI CERVICAL WITHOUT CONTRAST  MRI THORACIC SPINE WITHOUT AND WITH CONTRAST  TECHNIQUE: Multiplanar and multiecho pulse sequences of the cervical spine, to include the craniocervical junction and cervicothoracic junction, and the thoracic spine, were obtained without intravenous contrast. Postcontrast imaging of the thoracic spine obtained.  CONTRAST:  CONTRAST 10 ML MULTIHANCE.  COMPARISON:  MRI brain 10/02/2014. PET-CT 09/29/2014. Chest CT 10/25/2014.  FINDINGS:  MRI CERVICAL SPINE FINDINGS  The cervical alignment is normal. There is no evidence of metastatic disease or fracture. No progressive supraclavicular lymphadenopathy identified.  There are small retention cysts within the nasopharynx. The paraspinal soft tissues otherwise appear normal.  Mild disc bulging is present at C5-6 and C6-7. No disc herniation, cord deformity or significant foraminal compromise demonstrated.  MRI THORACIC SPINE FINDINGS  There are no suspicious osseous lesions within the thoracic spine. Marrow changes within the T11 and L1 vertebral bodies are associated with coarsened trabecula and osseous expansion, better seen on CT, and consistent with Paget's disease. There is a small T1 hyperintense lesion in the superior endplate of T7, probably a hemangioma.  The thoracic cord is normal in signal and caliber. No abnormal intradural enhancement seen.  Known mediastinal adenopathy is grossly stable. There is a superficial lipoma posteriorly in the upper left back.  No disc herniation, spinal stenosis or nerve root encroachment identified.  IMPRESSION: 1. No evidence of metastatic disease within the cervical or thoracic spine. 2. 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 3. No evidence of disc herniation, spinal stenosis or nerve root encroachment.   Electronically Signed   By: Richardean Sale M.D.   On: 10/27/2014 16:01    Assessment & Plan:   Lung cancer CT scan show positive response to treatment. Continue radiation therapy. We defer treatment this week, resume next week.  Chronic neck pain The patient has chronic pain syndrome. We'll continue morphine as needed for now MRI negative for bone mets  Left chest wall pain This could be related to recent port insertion. Clinically, it does not appear that the port is infected. There were no evidence of leukocytosis. No evidence of fluid collection on CT angiogram. Pain is  improved  Recent weight loss Recommend increase food intake and nutritional supplement as tolerated  DVT prophylaxis Recommend Lovenox  Poorly controlled hypertension This could be exacerbated by recent pain Continue current therapy for now  Discharge planning Discharge today.  Wheeler, [place], MD 10/28/2014  9:24 AM

## 2014-10-28 NOTE — Discharge Summary (Signed)
Physician Discharge Summary  Megan Sims HFW:[id number] DOB: Jan 12, 1956 DOA: 10/24/2014  PCP: Rosita Fire, MD  Admit date: 10/24/2014 Discharge date: 10/28/2014  Recommendations for Outpatient Follow-up:  1. Follow up with Interventional Radiology as an outpatient for possible nerve root injection for her chronic cervical neck pain 2. Ongoing pain management by Dr. Alvy Bimler   Discharge Diagnoses:  Principal Problem:   Musculoskeletal chest pain Active Problems:   Essential hypertension   Bilateral lung cancer ([place])   Chronic neck pain   Discharge Condition: Stable, improved  Diet recommendation: Regular  Wt Readings from Last 3 Encounters:  10/25/14 54.114 kg (119 lb 4.8 oz)  10/21/14 54.159 kg (119 lb 6.4 oz)  10/19/14 54.386 kg (119 lb 14.4 oz)    History of present illness:  The patient is a 59 year old female with stage III non-small cell lung cancer being treated with radiation and chemotherapy, chronic pain, hypertension who presented with sudden onset chest pain at rest and around her Port-A-Cath that lasted for an over and hour with associated shortness of breath and palpitations. She had a Port-A-Cath placed approximately 3 weeks ago to start her chemotherapy and has not had any complications with administration of her chemotherapy per her report. She denied fevers, redness around her Port-A-Cath site, swelling around her Port-A-Cath site. She denied swelling in her legs. She did not have any associated diaphoresis, nausea, vomiting.  CT angiogram PE with negative for pulmonary embolism. Chest x-ray negative for pulmonary edema or infiltrate. Her EKG demonstrated no evidence of ischemic changes. She was admitted for chest pain on observation.  Hospital Course:   Probable musculoskeletal chest pain. CT scan was negative for pulmonary embolism and there is no evidence of pulmonary edema or pneumonia. Her troponins were negative.  Her echocardiogram demonstrated mild LVH with  preserved ejection fraction of 55-60%, normal wall motion, grade 1 diastolic dysfunction and trivial aortic valve regurgitation. Her chest pain gradually resolved. She was seen by oncology who felt that there was no evidence to support a possible Port-A-Cath infection. There is no fluid collection or signs of subcutaneous soft tissue edema on her CT scan.  I have low suspicion for primary cardiac etiology.  She was not given NSAIDS because her pain resolved on its own.    Chronic cervical neck pain. She underwent MRI of the cervical and thoracic spine which demonstrated some small bulging discs at C5-6 and C6-7 without evidence of cord impingement. 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 She was advised to follow-up with interventional radiology or neurosurgery for consideration of injections which she states have helped in the past.  Lung cancer, CT scan demonstrated positive response to treatment. She will follow-up with her oncologist in 1-2 weeks to resume chemotherapy and she continues to do radiation therapy.  Recent weight loss, likely secondary to her malignancy and chemotherapy with radiation. She was advised to eat a regular diet and start supplements.  Poorly controlled hypertension, her blood pressure improved with improved pain control. She was not given prescription for any new pain medication as this is managed by oncology.  She continued norvasc and lisinopril.    Possible UTI, completed 3 days of ceftriaxone.  Culture grew mixed flora.  Procedures:  Echocardiogram  MRI cervical and thoracic spine  CT angiogram on her embolism protocol  Consultations:  Radiation oncology  Oncology  Discharge Exam: Filed Vitals:   10/28/14 0520  BP: 128/57  Pulse: 72  Temp: 97.9 F (36.6 C)  Resp:  [place]:   10/27/14 1309 10/27/14 2011 10/27/14 2110 10/28/14 0520  BP:  94/57 102/55 128/57  Pulse: 82 80 80 72  Temp: 98.3 F (36.8 C) 98.4 F  (36.9 C) 98.7 F (37.1 C) 97.9 F (36.6 C)  TempSrc: Oral Oral Oral Oral  Resp: '16 18 18 18  '$ Height:      Weight:      SpO2: 98% 96% 99% 97%    General: Thin female, no acute distress Cardiovascular: Regular rate and rhythm, no murmurs, rubs, or gallops Respiratory: Clear to auscultation bilaterally, no increased work of breathing Abdomen: NABS, soft, nondistended, nontender MSK: Normal tone and bulk, no lower extremity edema, Port-A-Cath site appears to be non-erythematous, no swelling, no pain with palpation around her Port-A-Cath. Her Port-A-Cath is accessed and appears to be functioning properly.  Discharge Instructions      Discharge Instructions    Call MD for:  difficulty breathing, headache or visual disturbances    Complete by:  As directed      Call MD for:  extreme fatigue    Complete by:  As directed      Call MD for:  hives    Complete by:  As directed      Call MD for:  persistant dizziness or light-headedness    Complete by:  As directed      Call MD for:  persistant nausea and vomiting    Complete by:  As directed      Call MD for:  redness, tenderness, or signs of infection (pain, swelling, redness, odor or green/yellow discharge around incision site)    Complete by:  As directed      Call MD for:  severe uncontrolled pain    Complete by:  As directed      Call MD for:  temperature >100.4    Complete by:  As directed      Diet general    Complete by:  As directed      Driving Restrictions    Complete by:  As directed   No driving or operating heavy machinery while taking morphine.     Increase activity slowly    Complete by:  As directed             Medication List    STOP taking these medications        lidocaine-prilocaine cream  Commonly known as:  EMLA      TAKE these medications        amLODipine 10 MG tablet  Commonly known as:  NORVASC  Take 1 tablet (10 mg total) by mouth daily.     diphenhydrAMINE 25 MG tablet  Commonly known  as:  BENADRYL  Take 25 mg by mouth every 6 (six) hours as needed.     EPINEPHrine 0.3 mg/0.3 mL Soaj injection  Commonly known as:  EPI-PEN  Inject 0.3 mLs (0.3 mg total) into the muscle once.     lisinopril 20 MG tablet  Commonly known as:  PRINIVIL,ZESTRIL  Take 1 tablet (20 mg total) by mouth daily.     morphine 15 MG tablet  Commonly known as:  MSIR  Take 1 tablet (15 mg total) by mouth every 6 (six) hours as needed.     ondansetron 8 MG tablet  Commonly known as:  ZOFRAN  Take 1 tablet (8 mg total) by mouth every 8 (eight) hours as needed.     polyethylene glycol packet  Commonly known as:  MIRALAX  Take 17 g by mouth daily.     prochlorperazine 10 MG tablet  Commonly known as:  COMPAZINE  Take 1 tablet (10 mg total) by mouth every 6 (six) hours as needed (Nausea or vomiting).     triamcinolone cream 0.1 %  Commonly known as:  KENALOG  Apply 1 application topically 4 (four) times daily as needed.       Follow-up Information    Follow up with [facility], MD. Schedule an appointment as soon as possible for a visit in 1 week.   Specialty:  Internal Medicine   Contact information:   [place] [id number] [phone]       Follow up with [facility], NI, MD. Schedule an appointment as soon as possible for a visit in 1 week.   Specialty:  Hematology and Oncology   Contact information:   [place] [phone]       Schedule an appointment as soon as possible for a visit with Tamala Fothergill, MD.   Specialty:  Neurosurgery   Why:  As needed for neck pain    Contact information:   [address] [place] [address] [phone]       Follow up with Greggory Keen, MD.   Specialty:  Interventional Radiology   Why:  As needed for neck pain.     Contact information:   [address] [place] [address] [phone]        The results of significant diagnostics from this  hospitalization (including imaging, microbiology, ancillary and laboratory) are listed below for reference.    Significant Diagnostic Studies: Ct Head Wo Contrast  10/25/2014   CLINICAL DATA:  Weakness and intermittent headache  EXAM: CT HEAD WITHOUT CONTRAST  TECHNIQUE: Contiguous axial images were obtained from the base of the skull through the vertex without intravenous contrast.  COMPARISON:  Brain MRI 10/02/2014  FINDINGS: Skull and Sinuses:Remote bilateral pterional craniotomy for aneurysm clipping.  Remote blowout fracture of the medial wall right orbit.  No acute finding or destructive process. No sinusitis or mastoiditis  Orbits: No acute abnormality.  Brain: No evidence of acute infarction, hemorrhage, hydrocephalus, or mass lesion/mass effect.  Aneurysm clips around the bilateral circle-of-Willis have stable orientation.  Gliosis in the right temporal pole, likely from surgical access, is stable. Stable pattern of advanced for age chronic small vessel disease, patchy throughout the bilateral cerebral white matter and underestimated compared MRI. 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  IMPRESSION: 1. No acute finding. 2. Chronic small vessel disease and sequela of bilateral aneurysm clipping.   Electronically Signed   By: Monte Fantasia M.D.   On: 10/25/2014 04:04   Ct Angio Chest Pe W/cm &/or Wo Cm  10/25/2014   CLINICAL DATA:  Chest pain and tachycardia.  Lung cancer.  EXAM: CT ANGIOGRAPHY CHEST WITH CONTRAST  TECHNIQUE: Multidetector CT imaging of the chest was performed using the standard protocol during bolus administration of intravenous contrast. Multiplanar CT image reconstructions and MIPs were obtained to evaluate the vascular anatomy.  CONTRAST:  165m OMNIPAQUE IOHEXOL 350 MG/ML SOLN  COMPARISON:  09/10/2014  FINDINGS: THORACIC INLET/BODY WALL:  Left IJ catheter, tip at the upper SVC.  Right supraclavicular fossa adenopathy difficult to visualize due to  contrast timing. No indication of progression.  MEDIASTINUM:  No cardiomegaly. Thick appearance of the left ventricle, probable hypertrophy. Coronary atherosclerosis, extensive at and below  the left main bifurcation. No acute aortic finding. When accounting for motion, negative for pulmonary embolism.  Diminished prevascular and paratracheal lymphadenopathy. The paratracheal noticed 15 mm today, previously 20 mm.  LUNG WINDOWS:  There is no edema, consolidation, effusion, or pneumothorax.  Chronic diffuse bronchial wall thickening.  UPPER ABDOMEN:  No acute findings. Left adrenal thickening, not hypermetabolic on recent PET.  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 No acute finding.  Review of the MIP images confirms the above findings.  IMPRESSION: 1. Negative for pulmonary embolism or other acute finding. 2. 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 3. Coronary atherosclerosis. 4. 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   Electronically Signed   By: Monte Fantasia M.D.   On: 10/25/2014 02:22   Mr Jeri Cos QQ Contrast  10/03/2014   CLINICAL DATA:  New diagnosis of bilateral lung cancer. Personal history of bilateral craniotomies with aneurysm clips.  EXAM: MRI HEAD WITHOUT AND WITH CONTRAST  TECHNIQUE: Multiplanar, multiecho pulse sequences of the brain and surrounding structures were obtained without and with intravenous contrast.  CONTRAST:  10 mL MultiHance  COMPARISON:  CT head without contrast 03/14/2012.  FINDINGS: Bilateral pterional craniotomies are present for ICA aneurysm clips. These clips create moderate artifact on some sequences.  Extensive atrophy and white matter disease is advanced for age. A remote left paramedian pontine infarct is noted. Remote lacunar infarcts are present in the thalami, more prominent on the right. A remote left caudate head infarct is  present.  No acute hemorrhage is present.  The ventricles are of normal size.  The postcontrast images demonstrate no pathologic enhancement. There is no evidence for metastatic disease of the brain or meninges. Artifact from the aneurysm clips obscures only minimal portions of the medial temporal lobes bilaterally.  Flow is present in the visible major intracranial arteries. The globes and orbits are intact. A remote right medial blowout fracture is noted in the right orbit. The paranasal sinuses and mastoid air cells are clear.  IMPRESSION: 1. No evidence for metastatic disease to the brain. 2. Advanced atrophy and white matter disease compatible with chronic microvascular ischemia. 3. Remote lacunar infarcts of the basal ganglia bilaterally as well as the left paramedian pons. 4. Bilateral pterional craniotomies and ICA aneurysm clips are stable.   Electronically Signed   By: San Morelle M.D.   On: 10/03/2014 11:53   Mr Cervical Spine Wo Contrast  10/27/2014   CLINICAL DATA:  Neck pain radiating to the left shoulder and arm with left hand numbness. History of lung cancer. Evaluate for metastatic disease. Subsequent encounter.  EXAM: MRI CERVICAL WITHOUT CONTRAST  MRI THORACIC SPINE WITHOUT AND WITH CONTRAST  TECHNIQUE: Multiplanar and multiecho pulse sequences of the cervical spine, to include the craniocervical junction and cervicothoracic junction, and the thoracic spine, were obtained without intravenous contrast. Postcontrast imaging of the thoracic spine obtained.  CONTRAST:  CONTRAST 10 ML MULTIHANCE.  COMPARISON:  MRI brain 10/02/2014. PET-CT 09/29/2014. Chest CT 10/25/2014.  FINDINGS: MRI CERVICAL SPINE FINDINGS  The cervical alignment is normal. There is no evidence of metastatic disease or fracture. No progressive supraclavicular lymphadenopathy identified.  There are small retention cysts within the nasopharynx. The paraspinal soft tissues otherwise appear normal.  Mild disc bulging is  present at C5-6 and C6-7. No disc herniation, cord deformity or significant foraminal compromise demonstrated.  MRI THORACIC SPINE FINDINGS  There are no suspicious osseous lesions within the thoracic spine. Marrow changes within the T11 and L1 vertebral bodies are associated with coarsened trabecula and osseous expansion, better seen on CT, and consistent with Paget's disease. There is a small T1 hyperintense lesion in the superior endplate of T7, probably a hemangioma.  The thoracic cord is normal in signal and caliber. No abnormal intradural enhancement seen.  Known mediastinal adenopathy is grossly stable. There is a superficial lipoma posteriorly in the upper left back.  No disc herniation, spinal stenosis or nerve root encroachment identified.  IMPRESSION: 1. No evidence of metastatic disease within the cervical or thoracic spine. 2. 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 3. No evidence of disc herniation, spinal stenosis or nerve root encroachment.   Electronically Signed   By: Richardean Sale M.D.   On: 10/27/2014 16:01   Mr Thoracic Spine W Wo Contrast  10/27/2014   CLINICAL DATA:  Neck pain radiating to the left shoulder and arm with left hand numbness. History of lung cancer. Evaluate for metastatic disease. Subsequent encounter.  EXAM: MRI CERVICAL WITHOUT CONTRAST  MRI THORACIC SPINE WITHOUT AND WITH CONTRAST  TECHNIQUE: Multiplanar and multiecho pulse sequences of the cervical spine, to include the craniocervical junction and cervicothoracic junction, and the thoracic spine, were obtained without intravenous contrast. Postcontrast imaging of the thoracic spine obtained.  CONTRAST:  CONTRAST 10 ML MULTIHANCE.  COMPARISON:  MRI brain 10/02/2014. PET-CT 09/29/2014. Chest CT 10/25/2014.  FINDINGS: MRI CERVICAL SPINE FINDINGS  The cervical alignment is normal. There is no evidence of metastatic disease or fracture. No progressive supraclavicular  lymphadenopathy identified.  There are small retention cysts within the nasopharynx. The paraspinal soft tissues otherwise appear normal.  Mild disc bulging is present at C5-6 and C6-7. No disc herniation, cord deformity or significant foraminal compromise demonstrated.  MRI THORACIC SPINE FINDINGS  There are no suspicious osseous lesions within the thoracic spine. Marrow changes within the T11 and L1 vertebral bodies are associated with coarsened trabecula and osseous expansion, better seen on CT, and consistent with Paget's disease. There is a small T1 hyperintense lesion in the superior endplate of T7, probably a hemangioma.  The thoracic cord is normal in signal and caliber. No abnormal intradural enhancement seen.  Known mediastinal adenopathy is grossly stable. There is a superficial lipoma posteriorly in the upper left back.  No disc herniation, spinal stenosis or nerve root encroachment identified.  IMPRESSION: 1. No evidence of metastatic disease within the cervical or thoracic spine. 2. 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 3. No evidence of disc herniation, spinal stenosis or nerve root encroachment.   Electronically Signed   By: Richardean Sale M.D.   On: 10/27/2014 16:01   Nm Pet Image Initial (pi) Skull Base To Thigh  09/29/2014   CLINICAL DATA:  Initial treatment strategy for metastatic carcinoma with squamous differentiation, primary site uncertain.  EXAM: NUCLEAR MEDICINE PET SKULL BASE TO THIGH  TECHNIQUE: 11.5 mCi F-18 FDG was injected intravenously. Full-ring PET imaging was performed from the skull base to thigh after the radiotracer. CT data was obtained and used for attenuation correction and anatomic localization.  FASTING BLOOD GLUCOSE:  Value: 86 mg/dl  COMPARISON:  Multiple exams, including 09/10/2014  FINDINGS: NECK  Nonspecific activity along the adenoids mildly hypermetabolic with maximum standard uptake value 5.1. Right  supraclavicular lymph node just behind the clavicle measuring 0.7 cm in Ota Ebersole  axis on image 60 series 4 has maximum standard uptake value 8.2. There is adjacent gas in the soft tissues likely from recent surgical biopsy. Clips are noted lateral to the cavernous sinuses bilaterally.  CHEST  Hypermetabolic mediastinal adenopathy. Right prevascular node measuring 2.0 cm in Heily Carlucci axis on image 78 series 4 has maximum standard uptake value 18.6. Right paratracheal node measuring 2.0 cm in Osamah Schmader axis on image 81 series 4 has maximum standard uptake value 19.7. Left eccentric prevascular node measuring 2.2 cm in Aleshka Corney axis on image 80 series 4 has maximum standard uptake value 15.6. A confluence of ill-defined ground-glass opacity in the right upper lobe centrally has a maximum standard uptake value of 2.8. Right hilar hypermetabolic node with maximum standard uptake value 14.2 noted.  ABDOMEN/PELVIS  No abnormal hypermetabolic activity within the liver, pancreas, adrenal glands, or spleen. No hypermetabolic lymph nodes in the abdomen or pelvis. Aortoiliac atherosclerotic vascular disease. No abnormal anorectal activity identified.  SKELETON  Pagetoid appearance of T11, L1, L5, and of the entire bony pelvis. This is not accompanied by significant hypermetabolic bony activity.  Prior bilateral craniotomies.  IMPRESSION: 1. Hypermetabolic mediastinal and right hilar adenopathy. Very faint ground-glass opacity in the right upper lobe with low-grade hypermetabolic activity, conceivably malignant with maximum standard uptake value of 2.8, but seemingly unlikely to be the source of all of this prominent adenopathy in the chest given how ill-defined and sub solid the pulmonary nodularity as. 2. Low-level focus of activity centrally along the adenoids, maximum standard uptake value 5.1, probably from residual active lymphoid tissue rather than as a primary source of the disease elsewhere in the chest. 3. Postoperative findings  adjacent to a hypermetabolic right supraclavicular lymph node. 4. Paget's disease in the lower thoracic spine, parts of the lumbar spine, and bony pelvis.   Electronically Signed   By: Van Clines M.D.   On: 09/29/2014 11:01   Ir Fluoro Guide Cv Line Right  10/08/2014   CLINICAL DATA:  Metastatic adenocarcinoma, needs venous access for chemotherapy. Recent right neck surgical biopsy.  EXAM: TUNNELED PORT CATHETER PLACEMENT WITH ULTRASOUND AND FLUOROSCOPIC GUIDANCE  FLUOROSCOPY TIME:  42 seconds, 3 mGy  ANESTHESIA/SEDATION: Intravenous Fentanyl and Versed were administered as conscious sedation during continuous cardiorespiratory monitoring by the radiology RN, with a total moderate sedation time of less than 30 minutes.  TECHNIQUE: The procedure, risks, benefits, and alternatives were explained to the patient. Questions regarding the procedure were encouraged and answered. The patient understands and consents to the procedure. As antibiotic prophylaxis, cefazolin 2 g was ordered pre-procedure and administered intravenously within one hour of incision. Patency of the left IJ vein was confirmed with ultrasound with image documentation. An appropriate skin site was determined. Skin site was marked. Region was prepped using maximum barrier technique including cap and mask, sterile gown, sterile gloves, large sterile sheet, and Chlorhexidine as cutaneous antisepsis. The region was infiltrated locally with 1% lidocaine. Under real-time ultrasound guidance, the left IJ vein was accessed with a 21 gauge micropuncture needle; the needle tip within the vein was confirmed with ultrasound image documentation. Needle was exchanged over a 018 guidewire for transitional dilator which allowed passage of the [facility] wire into the IVC. Over this, the transitional dilator was exchanged for a 5 [place] MPA catheter. A small incision was made on the left anterior chest wall and a subcutaneous pocket fashioned. The  power-injectable port was positioned and its catheter tunneled to the left IJ dermatotomy site. The MPA catheter was  exchanged over an Amplatz wire for a peel-away sheath, through which the port catheter, which had been trimmed to the appropriate length, was advanced and positioned under fluoroscopy with its tip at the cavoatrial junction. Spot chest radiograph confirms good catheter position and no pneumothorax. The pocket was closed with deep interrupted and subcuticular continuous 3-0 Monocryl sutures. The port was flushed per protocol. The incisions were covered with Dermabond then covered with a sterile dressing.  COMPLICATIONS: COMPLICATIONS None immediate  IMPRESSION: Technically successful left IJ power-injectable port catheter placement. Ready for routine use.   Electronically Signed   By: Lucrezia Europe M.D.   On: 10/08/2014 15:17   Ir US Guide Vasc Access Right  10/08/2014   CLINICAL DATA:  Metastatic adenocarcinoma, needs venous access for chemotherapy. Recent right neck surgical biopsy.  EXAM: TUNNELED PORT CATHETER PLACEMENT WITH ULTRASOUND AND FLUOROSCOPIC GUIDANCE  FLUOROSCOPY TIME:  42 seconds, 3 mGy  ANESTHESIA/SEDATION: Intravenous Fentanyl and Versed were administered as conscious sedation during continuous cardiorespiratory monitoring by the radiology RN, with a total moderate sedation time of less than 30 minutes.  TECHNIQUE: The procedure, risks, benefits, and alternatives were explained to the patient. Questions regarding the procedure were encouraged and answered. The patient understands and consents to the procedure. As antibiotic prophylaxis, cefazolin 2 g was ordered pre-procedure and administered intravenously within one hour of incision. Patency of the left IJ vein was confirmed with ultrasound with image documentation. An appropriate skin site was determined. Skin site was marked. Region was prepped using maximum barrier technique including cap and mask, sterile gown, sterile gloves,  large sterile sheet, and Chlorhexidine as cutaneous antisepsis. The region was infiltrated locally with 1% lidocaine. Under real-time ultrasound guidance, the left IJ vein was accessed with a 21 gauge micropuncture needle; the needle tip within the vein was confirmed with ultrasound image documentation. Needle was exchanged over a 018 guidewire for transitional dilator which allowed passage of the [facility] wire into the IVC. Over this, the transitional dilator was exchanged for a 5 [place] MPA catheter. A small incision was made on the left anterior chest wall and a subcutaneous pocket fashioned. The power-injectable port was positioned and its catheter tunneled to the left IJ dermatotomy site. The MPA catheter was exchanged over an Amplatz wire for a peel-away sheath, through which the port catheter, which had been trimmed to the appropriate length, was advanced and positioned under fluoroscopy with its tip at the cavoatrial junction. Spot chest radiograph confirms good catheter position and no pneumothorax. The pocket was closed with deep interrupted and subcuticular continuous 3-0 Monocryl sutures. The port was flushed per protocol. The incisions were covered with Dermabond then covered with a sterile dressing.  COMPLICATIONS: COMPLICATIONS None immediate  IMPRESSION: Technically successful left IJ power-injectable port catheter placement. Ready for routine use.   Electronically Signed   By: Lucrezia Europe M.D.   On: 10/08/2014 15:17   Dg Chest Portable 1 View  10/25/2014   CLINICAL DATA:  Generalized body aches, heartburn symptoms for 3 days. Chest pain, back pain and headache. On weekly chemo radiation. History of lung cancer.  EXAM: PORTABLE CHEST 1 VIEW  COMPARISON:  Chest radiograph September 08, 2014  FINDINGS: Cardiomediastinal silhouette is normal. Strandy densities LEFT lung base.The lungs are clear without pleural effusions or focal consolidations. Single lumen LEFT chest Port-A-Cath with distal tip projecting  in proximal superior vena cava. Trachea projects midline and there is no pneumothorax. Soft tissue planes and included osseous structures are non-suspicious.  IMPRESSION: LEFT  lung base atelectasis.   Electronically Signed   By: Elon Alas M.D.   On: 10/25/2014 00:36    Microbiology: Recent Results (from the past 240 hour(s))  Urine culture     Status: None   Collection Time: 10/25/14 12:21 AM  Result Value Ref Range Status   Specimen Description URINE, CLEAN CATCH  Final   Special Requests NONE  Final   Culture   Final    MULTIPLE SPECIES PRESENT, SUGGEST RECOLLECTION Performed at [facility]    Report Status 10/28/2014 FINAL  Final  Blood culture (routine x 2)     Status: None (Preliminary result)   Collection Time: 10/25/14  2:39 AM  Result Value Ref Range Status   Specimen Description BLOOD RIGHT ANTECUBITAL  Final   Special Requests BOTTLES DRAWN AEROBIC AND ANAEROBIC [place]  Final   Culture NO GROWTH 3 DAYS  Final   Report Status PENDING  Incomplete  Blood culture (routine x 2)     Status: None (Preliminary result)   Collection Time: 10/25/14  2:47 AM  Result Value Ref Range Status   Specimen Description RIGHT ANTECUBITAL  Final   Special Requests BOTTLES DRAWN AEROBIC AND ANAEROBIC 8CC  Final   Culture NO GROWTH 3 DAYS  Final   Report Status PENDING  Incomplete     Labs: Basic Metabolic Panel:  Recent Labs Lab 10/25/14 0021 10/25/14 0032  NA 137 141  K 3.6 3.6  CL 108 106  CO2 23  --   GLUCOSE 102* 101*  BUN 16 15  CREATININE 0.77 0.80  CALCIUM 8.4*  --    Liver Function Tests:  Recent Labs Lab 10/25/14 0021  AST 20  ALT 11*  ALKPHOS 123  BILITOT 0.5  PROT 6.6  ALBUMIN 3.4*   No results for input(s): LIPASE, AMYLASE in the last 168 hours. No results for input(s): AMMONIA in the last 168 hours. CBC:  Recent Labs Lab 10/25/14 0021 10/25/14 0032  WBC 5.5  --   NEUTROABS 3.8  --   HGB 11.3* 12.9  HCT 34.2* 38.0  MCV 90.7  --   PLT  301  --    Cardiac Enzymes:  Recent Labs Lab 10/25/14 0021 10/25/14 0420 10/25/14 1036 10/25/14 1648  TROPONINI <0.03 <0.03 <0.03 <0.03   BNP: BNP (last 3 results) No results for input(s): BNP in the last 8760 hours.  ProBNP (last 3 results) No results for input(s): PROBNP in the last 8760 hours.  CBG: No results for input(s): GLUCAP in the last 168 hours.  Time coordinating discharge: 35 minutes  Signed:  Anoushka Divito  [facility] 10/28/2014, 11:37 AM

## 2014-10-28 NOTE — Progress Notes (Signed)
Pt discharged home with spouse in stable condition. Discharge instructions given. Pt verbalized understanding.

## 2014-10-29 ENCOUNTER — Ambulatory Visit
Admission: RE | Admit: 2014-10-29 | Discharge: 2014-10-29 | Disposition: A | Discharge status: Home / Self Care | Payer: Medicaid Other | Source: Ambulatory Visit | Attending: Radiation Oncology | Admitting: Radiation Oncology

## 2014-10-29 DIAGNOSIS — Z510 Encounter for antineoplastic radiation therapy: Secondary | ICD-10-CM | POA: Diagnosis not present

## 2014-10-30 ENCOUNTER — Ambulatory Visit
Admission: RE | Admit: 2014-10-30 | Discharge: 2014-10-30 | Disposition: A | Discharge status: Home / Self Care | Payer: Medicaid Other | Source: Ambulatory Visit | Attending: Radiation Oncology | Admitting: Radiation Oncology

## 2014-10-30 ENCOUNTER — Telehealth: Payer: Self-pay | Admitting: *Deleted

## 2014-10-30 DIAGNOSIS — Z510 Encounter for antineoplastic radiation therapy: Secondary | ICD-10-CM | POA: Diagnosis not present

## 2014-10-30 LAB — CULTURE, BLOOD (ROUTINE X 2)
CULTURE: NO GROWTH
CULTURE: NO GROWTH

## 2014-10-30 NOTE — Telephone Encounter (Signed)
Called patient to ask question, spoke with patient and she informed me of what that she wants to do

## 2014-11-02 ENCOUNTER — Encounter: Payer: Self-pay | Admitting: Radiation Oncology

## 2014-11-02 ENCOUNTER — Ambulatory Visit
Admission: RE | Admit: 2014-11-02 | Discharge: 2014-11-02 | Disposition: A | Discharge status: Home / Self Care | Payer: Medicaid Other | Source: Ambulatory Visit | Attending: Radiation Oncology | Admitting: Radiation Oncology

## 2014-11-02 VITALS — BP 161/94 | HR 87 | Temp 97.9°F | Resp 20 | Ht 64.0 in | Wt 119.7 lb

## 2014-11-02 DIAGNOSIS — C3411 Malignant neoplasm of upper lobe, right bronchus or lung: Secondary | ICD-10-CM

## 2014-11-02 DIAGNOSIS — Z510 Encounter for antineoplastic radiation therapy: Secondary | ICD-10-CM | POA: Diagnosis not present

## 2014-11-02 MED ORDER — SUCRALFATE 1 G PO TABS: ORAL_TABLET | ORAL | Status: DC

## 2014-11-02 MED ORDER — LIDOCAINE VISCOUS 2 % MT SOLN: OROMUCOSAL | Status: DC

## 2014-11-02 NOTE — Progress Notes (Signed)
   Weekly Management Note:  Outpatient    ICD-9-CM ICD-10-CM   1. 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  CBCT/MVCT images/Port film x-rays were reviewed.  The chart was checked. Doing relatively well with some odynophagia. Weight stable.   Physical Findings:  Wt Readings from Last 3 Encounters:  11/02/14 119 lb 11.2 oz (54.296 kg)  10/25/14 119 lb 4.8 oz (54.114 kg)  10/21/14 119 lb 6.4 oz (54.159 kg)    height is '5\' 4"'$  (1.626 m) and weight is 119 lb 11.2 oz (54.296 kg). Her oral temperature is 97.9 F (36.6 C). Her blood pressure is 161/94 and her pulse is 87. Her respiration is 20 and oxygen saturation is 100%.  oropharynx clear, skin intact over chest.  CBC    Component Value Date/Time   WBC 5.5 10/25/2014 0021   WBC 8.7 10/20/2014 1054   RBC 3.77* 10/25/2014 0021   RBC 3.89 10/20/2014 1054   HGB 12.9 10/25/2014 0032   HGB 11.5* 10/20/2014 1054   HCT 38.0 10/25/2014 0032   HCT 35.5 10/20/2014 1054   PLT 301 10/25/2014 0021   PLT 302 10/20/2014 1054   MCV 90.7 10/25/2014 0021   MCV 91.3 10/20/2014 1054   MCH 30.0 10/25/2014 0021   MCH 29.6 10/20/2014 1054   MCHC 33.0 10/25/2014 0021   MCHC 32.4 10/20/2014 1054   RDW 17.7* 10/25/2014 0021   RDW 17.7* 10/20/2014 1054   LYMPHSABS 0.9 10/25/2014 0021   LYMPHSABS 1.3 10/20/2014 1054   MONOABS 0.7 10/25/2014 0021   MONOABS 0.7 10/20/2014 1054   EOSABS 0.1 10/25/2014 0021   EOSABS 0.1 10/20/2014 1054   BASOSABS 0.0 10/25/2014 0021   BASOSABS 0.0 10/20/2014 1054     CMP     Component Value Date/Time   NA 141 10/25/2014 0032   NA 140 10/20/2014 1054   K 3.6 10/25/2014 0032   K 4.1 10/20/2014 1054   CL 106 10/25/2014 0032   CO2 23 10/25/2014 0021   CO2 27 10/20/2014 1054   GLUCOSE 101* 10/25/2014 0032   GLUCOSE 94 10/20/2014 1054   BUN 15 10/25/2014 0032   BUN 15.2 10/20/2014 1054   CREATININE 0.80 10/25/2014 0032   CREATININE 0.8 10/20/2014 1054   CALCIUM 8.4* 10/25/2014 0021   CALCIUM 9.3 10/20/2014 1054   PROT 6.6 10/25/2014 0021   PROT 6.6 10/20/2014 1054   ALBUMIN 3.4* 10/25/2014 0021   ALBUMIN 3.2* 10/20/2014 1054   AST 20 10/25/2014 0021   AST 19 10/20/2014 1054   ALT 11* 10/25/2014 0021   ALT 10 10/20/2014 1054   ALKPHOS 123 10/25/2014 0021   ALKPHOS 145 10/20/2014 1054   BILITOT 0.5 10/25/2014 0021   BILITOT <0.30 10/20/2014 1054   GFRNONAA >60 10/25/2014 0021   GFRAA >60 10/25/2014 0021     Impression:  The patient is tolerating radiotherapy.   Plan:  Continue radiotherapy as planned. Rx for esophagitis made as above in Dx.    -----------------------------------  Eppie Gibson, MD

## 2014-11-02 NOTE — Progress Notes (Signed)
Megan Sims has completed 13 fractions to her chest/right subclavian area.  She continues to have pain in her left shoulder that she is rating at a 7/10.  She is taking morphine about 3 times a week.  She reports having a slight sore throat that started a week ago.  She reports she is careful when she is swallowing and eats smaller amounts.  She reports having a good appetite and is drinking 3-4 boost/ensures per day. She denies having a cough.  She will have chemotherapy on Wednesday.  Her skin is intact on her chest.  She is using radiaplex.  She denies fatigue.  BP 161/94 mmHg  Pulse 87  Temp(Src) 97.9 F (36.6 C) (Oral)  Resp 20  Ht '5\' 4"'$  (1.626 m)  Wt 119 lb 11.2 oz (54.296 kg)  BMI 20.54 kg/m2  SpO2 100%   Wt Readings from Last 3 Encounters:  11/02/14 119 lb 11.2 oz (54.296 kg)  10/25/14 119 lb 4.8 oz (54.114 kg)  10/21/14 119 lb 6.4 oz (54.159 kg)

## 2014-11-03 ENCOUNTER — Other Ambulatory Visit (HOSPITAL_BASED_OUTPATIENT_CLINIC_OR_DEPARTMENT_OTHER): Payer: Medicaid Other

## 2014-11-03 ENCOUNTER — Ambulatory Visit
Admission: RE | Admit: 2014-11-03 | Discharge: 2014-11-03 | Disposition: A | Discharge status: Home / Self Care | Payer: Medicaid Other | Source: Ambulatory Visit | Attending: Radiation Oncology | Admitting: Radiation Oncology

## 2014-11-03 DIAGNOSIS — Z510 Encounter for antineoplastic radiation therapy: Secondary | ICD-10-CM | POA: Diagnosis not present

## 2014-11-03 DIAGNOSIS — C3491 Malignant neoplasm of unspecified part of right bronchus or lung: Secondary | ICD-10-CM | POA: Diagnosis present

## 2014-11-03 DIAGNOSIS — C3492 Malignant neoplasm of unspecified part of left bronchus or lung: Principal | ICD-10-CM

## 2014-11-03 LAB — COMPREHENSIVE METABOLIC PANEL (CC13)
ALBUMIN: 3.5 g/dL (ref 3.5–5.0)
ALK PHOS: 140 U/L (ref 40–150)
ALT: 10 U/L (ref 0–55)
AST: 18 U/L (ref 5–34)
Anion Gap: 8 mEq/L (ref 3–11)
BUN: 11.4 mg/dL (ref 7.0–26.0)
CALCIUM: 9.6 mg/dL (ref 8.4–10.4)
CO2: 24 mEq/L (ref 22–29)
CREATININE: 0.8 mg/dL (ref 0.6–1.1)
Chloride: 109 mEq/L (ref 98–109)
EGFR: >90 mL/min/{1.73_m2} (ref >90)
GLUCOSE: 89 mg/dL (ref 70–140)
Potassium: 4 mEq/L (ref 3.5–5.1)
Sodium: 140 mEq/L (ref 136–145)
TOTAL PROTEIN: 6.7 g/dL (ref 6.4–8.3)

## 2014-11-03 LAB — CBC WITH DIFFERENTIAL/PLATELET
BASO%: 0.5 % (ref 0.0–2.0)
BASOS ABS: 0 10*3/uL (ref 0.0–0.1)
EOS%: 1.4 % (ref 0.0–7.0)
Eosinophils Absolute: 0.1 10*3/uL (ref 0.0–0.5)
HEMATOCRIT: 36.4 % (ref 34.8–46.6)
HEMOGLOBIN: 11.8 g/dL (ref 11.6–15.9)
LYMPH#: 0.8 10*3/uL — AB (ref 0.9–3.3)
LYMPH%: 13.4 % — ABNORMAL LOW (ref 14.0–49.7)
MCH: 29.9 pg (ref 25.1–34.0)
MCHC: 32.4 g/dL (ref 31.5–36.0)
MCV: 92.2 fL (ref 79.5–101.0)
MONO#: 1 10*3/uL — ABNORMAL HIGH (ref 0.1–0.9)
MONO%: 17.5 % — ABNORMAL HIGH (ref 0.0–14.0)
NEUT%: 67.2 % (ref 38.4–76.8)
NEUTROS ABS: 3.8 10*3/uL (ref 1.5–6.5)
Platelets: 255 10*3/uL (ref 145–400)
RBC: 3.95 10*6/uL (ref 3.70–5.45)
RDW: 18.3 % — AB (ref 11.2–14.5)
WBC: 5.6 10*3/uL (ref 3.9–10.3)

## 2014-11-04 ENCOUNTER — Telehealth: Payer: Self-pay | Admitting: Hematology and Oncology

## 2014-11-04 ENCOUNTER — Ambulatory Visit (HOSPITAL_BASED_OUTPATIENT_CLINIC_OR_DEPARTMENT_OTHER): Payer: Medicaid Other | Admitting: Hematology and Oncology

## 2014-11-04 ENCOUNTER — Ambulatory Visit
Admission: RE | Admit: 2014-11-04 | Discharge: 2014-11-04 | Disposition: A | Discharge status: Home / Self Care | Payer: Medicaid Other | Source: Ambulatory Visit | Attending: Radiation Oncology | Admitting: Radiation Oncology

## 2014-11-04 ENCOUNTER — Ambulatory Visit (HOSPITAL_BASED_OUTPATIENT_CLINIC_OR_DEPARTMENT_OTHER): Payer: Medicaid Other

## 2014-11-04 ENCOUNTER — Encounter: Payer: Self-pay | Admitting: Hematology and Oncology

## 2014-11-04 VITALS — BP 160/80 | HR 96 | Temp 98.1°F | Resp 19 | Ht 64.0 in | Wt 120.7 lb

## 2014-11-04 DIAGNOSIS — M542 Cervicalgia: Secondary | ICD-10-CM

## 2014-11-04 DIAGNOSIS — C3491 Malignant neoplasm of unspecified part of right bronchus or lung: Secondary | ICD-10-CM

## 2014-11-04 DIAGNOSIS — Z5111 Encounter for antineoplastic chemotherapy: Secondary | ICD-10-CM | POA: Diagnosis present

## 2014-11-04 DIAGNOSIS — G8929 Other chronic pain: Secondary | ICD-10-CM | POA: Diagnosis not present

## 2014-11-04 DIAGNOSIS — C3492 Malignant neoplasm of unspecified part of left bronchus or lung: Secondary | ICD-10-CM

## 2014-11-04 DIAGNOSIS — I10 Essential (primary) hypertension: Secondary | ICD-10-CM | POA: Diagnosis not present

## 2014-11-04 DIAGNOSIS — Z510 Encounter for antineoplastic radiation therapy: Secondary | ICD-10-CM | POA: Diagnosis not present

## 2014-11-04 MED ORDER — SODIUM CHLORIDE 0.9 % IV SOLN
Freq: Once | INTRAVENOUS | Status: AC
  Administered 2014-11-04: 12:00:00 via INTRAVENOUS

## 2014-11-04 MED ORDER — PACLITAXEL CHEMO INJECTION 300 MG/50ML
45.0000 mg/m2 | Freq: Once | INTRAVENOUS | Status: AC
  Administered 2014-11-04: 66 mg via INTRAVENOUS
  Filled 2014-11-04: qty 11

## 2014-11-04 MED ORDER — SODIUM CHLORIDE 0.9 % IV SOLN
Freq: Once | INTRAVENOUS | Status: AC
  Administered 2014-11-04: 12:00:00 via INTRAVENOUS
  Filled 2014-11-04: qty 8

## 2014-11-04 MED ORDER — DIPHENHYDRAMINE HCL 50 MG/ML IJ SOLN
50.0000 mg | Freq: Once | INTRAMUSCULAR | Status: AC
  Administered 2014-11-04: 50 mg via INTRAVENOUS

## 2014-11-04 MED ORDER — HYDROCHLOROTHIAZIDE 25 MG PO TABS: 25.0000 mg | ORAL_TABLET | Freq: Every day | ORAL | Status: DC

## 2014-11-04 MED ORDER — SODIUM CHLORIDE 0.9 % IJ SOLN
10.0000 mL | INTRAMUSCULAR | Status: DC | PRN
  Administered 2014-11-04: 10 mL
  Filled 2014-11-04: qty 10

## 2014-11-04 MED ORDER — DIPHENHYDRAMINE HCL 50 MG/ML IJ SOLN
INTRAMUSCULAR | Status: AC
  Filled 2014-11-04: qty 1

## 2014-11-04 MED ORDER — FAMOTIDINE IN NACL 20-0.9 MG/50ML-% IV SOLN
20.0000 mg | Freq: Once | INTRAVENOUS | Status: AC
  Administered 2014-11-04: 20 mg via INTRAVENOUS

## 2014-11-04 MED ORDER — FAMOTIDINE IN NACL 20-0.9 MG/50ML-% IV SOLN
INTRAVENOUS | Status: AC
Start: 2014-11-04 — End: 2014-11-04
  Filled 2014-11-04: qty 50

## 2014-11-04 MED ORDER — SODIUM CHLORIDE 0.9 % IV SOLN
177.0000 mg | Freq: Once | INTRAVENOUS | Status: AC
  Administered 2014-11-04: 180 mg via INTRAVENOUS
  Filled 2014-11-04: qty 18

## 2014-11-04 MED ORDER — HEPARIN SOD (PORK) LOCK FLUSH 100 UNIT/ML IV SOLN
500.0000 [IU] | Freq: Once | INTRAVENOUS | Status: AC | PRN
  Administered 2014-11-04: 500 [IU]
  Filled 2014-11-04: qty 5

## 2014-11-04 NOTE — Assessment & Plan Note (Signed)
Her blood pressure is poorly controlled. I recommend she take amlodipine in the morning and will start her on lisinopril in the evening In addition, I will add hydrochlorothiazide to take in the morning.

## 2014-11-04 NOTE — Progress Notes (Signed)
[facility] OFFICE PROGRESS NOTE  Patient Care Team: Rosita Fire, MD as PCP - General (Internal Medicine) Heath Lark, MD as Consulting Physician (Hematology and Oncology) Jodi Marble, MD as Consulting Physician (Otolaryngology) Eppie Gibson, MD as Attending Physician (Radiation Oncology)  SUMMARY OF ONCOLOGIC HISTORY: Oncology History   Bilateral lung cancer   Staging form: Lung, AJCC 6th Edition     Clinical stage from 10/01/2014: Stage IIIB (T4(m), N3, M0) - Signed by Heath Lark, MD on 10/01/2014 Cancer of upper lobe of right lung   Staging form: Lung, AJCC 7th Edition     Clinical: Stage Unknown ([place], N3, M0) - Signed by Eppie Gibson, MD on 10/07/2014       Bilateral lung cancer ([place])   09/05/2014 - 09/12/2014 Hospital Admission The patient was admitted to the hospital for further management of possible skin reaction, dysphagia and supraclavicular lymphadenopathy   09/06/2014 Imaging CT scan of the neck showed malignant appearing superior mediastinal and right thoracic inlet lymphadenopathy   09/10/2014 Imaging CT scan of the abdomen and pelvis confirmed right supraclavicular and mediastinal lymphadenopathy, as described above but no other evidence of metastatic cancer    09/14/2014 Procedure Flexible endoscopy in the ENT office failed to reveal any primary in the head and neck region   09/16/2014 Imaging She failed barium swallow. 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    INTERVAL HISTORY: Please see below for problem oriented charting. She is seen prior to chemotherapy. Her neck pain is stable. She denies recent side effects from treatment. No neuropathy. She is eating well and able to swallow food. She is gaining weight.  REVIEW OF SYSTEMS:   Constitutional: Denies fevers, chills or abnormal weight loss Eyes: Denies blurriness of vision Ears, nose, mouth, throat, and face: Denies mucositis or sore throat Respiratory: Denies cough, dyspnea or wheezes Cardiovascular: Denies palpitation, chest discomfort or lower extremity swelling Gastrointestinal:  Denies nausea, heartburn or change in bowel habits Lymphatics: Denies new lymphadenopathy or easy bruising Neurological:Denies numbness, tingling or new weaknesses Behavioral/Psych: Mood is stable, no new changes  All other systems were reviewed with the patient and are negative.  I have reviewed the past medical history, past surgical history, social history and family history with the patient and they are unchanged from previous note.  ALLERGIES:  is allergic to hydrocodone-acetaminophen.  MEDICATIONS:  Current Outpatient Prescriptions  Medication Sig Dispense Refill  . amLODipine (NORVASC) 10 MG tablet Take 1 tablet (10 mg total) by mouth daily. 30 tablet 0  . diphenhydrAMINE (BENADRYL) 25 MG tablet Take 25 mg by mouth every 6 (six) hours as needed.    Marland Kitchen emollient (BIAFINE) cream Apply topically as needed (apply as directed to Radiation field).    . EPINEPHrine 0.3 mg/0.3 mL IJ SOAJ injection Inject 0.3 mLs (0.3 mg total) into the muscle  once. 1 Device 0  . lidocaine (XYLOCAINE) 2 % solution Patient: Mix 1part 2% viscous lidocaine, 1part H20. Swallow 32m of this mixture, 320m  before meals and at bedtime, up to QID 100 mL 5  . lidocaine-prilocaine (EMLA) cream APPLY TO THE AFFECTED AREA ONCE AS DIRECTED BY MD  3  . lisinopril (PRINIVIL,ZESTRIL) 20 MG tablet Take 1 tablet (20 mg total) by mouth daily. 30 tablet 6  . morphine (MSIR) 15 MG tablet Take 1 tablet (15 mg total) by mouth every 6 (six) hours as needed. 60 tablet 0  . ondansetron (ZOFRAN) 8 MG tablet Take 1 tablet (8 mg total) by mouth every 8 (eight) hours as needed. 60 tablet 1  . polyethylene glycol (MIRALAX) packet Take 17 g by mouth daily. 14 each 0  . prochlorperazine (COMPAZINE) 10 MG tablet Take 1 tablet (10 mg total) by mouth every 6 (six) hours as needed (Nausea or vomiting). 60 tablet 1  . sucralfate (CARAFATE) 1 G tablet Dissolve 1 tablet in 10 mL H20 and swallow up to QID for sore throat 60 tablet 5  . hydrochlorothiazide (HYDRODIURIL) 25 MG tablet Take 1 tablet (25 mg total) by mouth daily. 30 tablet 6   No current facility-administered medications for this visit.   Facility-Administered Medications Ordered in Other Visits  Medication Dose Route Frequency Provider Last Rate Last Dose  . CARBOplatin (PARAPLATIN) 180 mg in sodium chloride 0.9 % 100 mL chemo infusion  180 mg Intravenous Once NiHeath LarkMD      . heparin lock flush 100 unit/mL  500 Units Intracatheter Once PRN NiHeath LarkMD      . PACLitaxel (TAXOL) 66 mg in dextrose 5 % 250 mL chemo infusion (</= '80mg'$ /m2)  45 mg/m2 (Treatment Plan Actual) Intravenous Once NiHeath LarkMD 261 mL/hr at 11/04/14 1241 66 mg at 11/04/14 1241  . sodium chloride 0.9 % injection 10 mL  10 mL Intracatheter PRN NiHeath LarkMD        PHYSICAL EXAMINATION: ECOG PERFORMANCE STATUS: 1 - Symptomatic but completely ambulatory  Filed Vitals:   11/04/14 1028  BP: 160/80  Pulse: 96  Temp: 98.1 F (36.7 C)  Resp: 19   Filed Weights   11/04/14 1028  Weight: 120 lb 11.2 oz (54.749 kg)    GENERAL:alert, no distress and comfortable SKIN: skin color,  texture, turgor are normal, no rashes or significant lesions EYES: normal, Conjunctiva are pink and non-injected, sclera clear OROPHARYNX:no exudate, no erythema and lips, buccal mucosa, and tongue normal  NECK: supple, thyroid normal size, non-tender, without nodularity LYMPH:  no palpable lymphadenopathy in the cervical, axillary or inguinal LUNGS: clear to auscultation and percussion with normal breathing effort HEART: regular rate & rhythm and no murmurs and no lower extremity edema ABDOMEN:abdomen soft, non-tender and normal bowel sounds Musculoskeletal:no cyanosis of digits and no clubbing  NEURO: alert & oriented x 3 with fluent speech, no focal motor/sensory deficits  LABORATORY DATA:  I have reviewed the data as listed    Component Value Date/Time   NA 140 11/03/2014 0913   NA 141 10/25/2014 0032   K 4.0 11/03/2014 0913   K 3.6 10/25/2014 0032   CL 106 10/25/2014 0032   CO2 24 11/03/2014 0913   CO2 23 10/25/2014 0021   GLUCOSE 89 11/03/2014 0913   GLUCOSE 101* 10/25/2014 0032   BUN 11.4 11/03/2014 0913   BUN 15 10/25/2014 0032   CREATININE 0.8 11/03/2014 0913   CREATININE 0.80 10/25/2014 0032  CALCIUM 9.6 11/03/2014 0913   CALCIUM 8.4* 10/25/2014 0021   PROT 6.7 11/03/2014 0913   PROT 6.6 10/25/2014 0021   ALBUMIN 3.5 11/03/2014 0913   ALBUMIN 3.4* 10/25/2014 0021   AST 18 11/03/2014 0913   AST 20 10/25/2014 0021   ALT 10 11/03/2014 0913   ALT 11* 10/25/2014 0021   ALKPHOS 140 11/03/2014 0913   ALKPHOS 123 10/25/2014 0021   BILITOT <0.30 11/03/2014 0913   BILITOT 0.5 10/25/2014 0021   GFRNONAA >60 10/25/2014 0021   GFRAA >60 10/25/2014 0021    No results found for: SPEP, UPEP  Lab Results  Component Value Date   WBC 5.6 11/03/2014   NEUTROABS 3.8 11/03/2014   HGB 11.8 11/03/2014   HCT 36.4 11/03/2014   MCV 92.2 11/03/2014   PLT 255 11/03/2014      Chemistry      Component Value Date/Time   NA 140 11/03/2014 0913   NA 141 10/25/2014 0032   K 4.0  11/03/2014 0913   K 3.6 10/25/2014 0032   CL 106 10/25/2014 0032   CO2 24 11/03/2014 0913   CO2 23 10/25/2014 0021   BUN 11.4 11/03/2014 0913   BUN 15 10/25/2014 0032   CREATININE 0.8 11/03/2014 0913   CREATININE 0.80 10/25/2014 0032      Component Value Date/Time   CALCIUM 9.6 11/03/2014 0913   CALCIUM 8.4* 10/25/2014 0021   ALKPHOS 140 11/03/2014 0913   ALKPHOS 123 10/25/2014 0021   AST 18 11/03/2014 0913   AST 20 10/25/2014 0021   ALT 10 11/03/2014 0913   ALT 11* 10/25/2014 0021   BILITOT <0.30 11/03/2014 0913   BILITOT 0.5 10/25/2014 0021      ASSESSMENT & PLAN:  Bilateral lung cancer ([place]) Recent CT scan show excellent response to treatment. We will proceed with chemotherapy today without dose adjustment.  Chronic neck pain The patient is not consistent with the pain medicine. We have discussed in the past about narcotic refill policy. She felt that IR morphine was too strong and preferred to take oxycodone. I reviewed the medication list with her. The prescribed dose of oxycodone is too low and after much discussion she is willing to try IR morphine again. I prohibit her from getting pain medication refill from other physicians. I also recommended she does not take Percocet because it contained Tylenol.  I'll see her back next week for further assessment of pain control  Essential hypertension Her blood pressure is poorly controlled. I recommend she take amlodipine in the morning and will start her on lisinopril in the evening In addition, I will add hydrochlorothiazide to take in the morning.     No orders of the defined types were placed in this encounter.   All questions were answered. The patient knows to call the clinic with any problems, questions or concerns. No barriers to learning was detected. I spent 20 minutes counseling the patient face to face. The total time spent in the appointment was 25 minutes and more than 50% was on counseling and review of  test results     [facility], [place], MD 11/04/2014 1:20 PM

## 2014-11-04 NOTE — Assessment & Plan Note (Signed)
Recent CT scan show excellent response to treatment. We will proceed with chemotherapy today without dose adjustment.

## 2014-11-04 NOTE — Telephone Encounter (Signed)
Added f/u for 10/19 pe r10/12 pof. Gave patient avs report and appointments for October.

## 2014-11-04 NOTE — Assessment & Plan Note (Signed)
The patient is not consistent with the pain medicine. We have discussed in the past about narcotic refill policy. She felt that IR morphine was too strong and preferred to take oxycodone. I reviewed the medication list with her. The prescribed dose of oxycodone is too low and after much discussion she is willing to try IR morphine again. I prohibit her from getting pain medication refill from other physicians. I also recommended she does not take Percocet because it contained Tylenol.  I'll see her back next week for further assessment of pain control

## 2014-11-04 NOTE — Patient Instructions (Signed)
[facility] Discharge Instructions for Patients Receiving Chemotherapy  Today you received the following chemotherapy agents Paclitaxel/Carboplatin.   To help prevent nausea and vomiting after your treatment, we encourage you to take your nausea medication as directed.    If you develop nausea and vomiting that is not controlled by your nausea medication, call the clinic.   BELOW ARE SYMPTOMS THAT SHOULD BE REPORTED IMMEDIATELY:  *FEVER GREATER THAN 100.5 F  *CHILLS WITH OR WITHOUT FEVER  NAUSEA AND VOMITING THAT IS NOT CONTROLLED WITH YOUR NAUSEA MEDICATION  *UNUSUAL SHORTNESS OF BREATH  *UNUSUAL BRUISING OR BLEEDING  TENDERNESS IN MOUTH AND THROAT WITH OR WITHOUT PRESENCE OF ULCERS  *URINARY PROBLEMS  *BOWEL PROBLEMS  UNUSUAL RASH Items with * indicate a potential emergency and should be followed up as soon as possible.  Feel free to call the clinic you have any questions or concerns. The clinic phone number is [phone].  Please show the [facility] at check-in to the Emergency Department and triage nurse.

## 2014-11-05 ENCOUNTER — Ambulatory Visit
Admission: RE | Admit: 2014-11-05 | Discharge: 2014-11-05 | Disposition: A | Discharge status: Home / Self Care | Payer: Medicaid Other | Source: Ambulatory Visit | Attending: Radiation Oncology | Admitting: Radiation Oncology

## 2014-11-05 DIAGNOSIS — Z510 Encounter for antineoplastic radiation therapy: Secondary | ICD-10-CM | POA: Diagnosis not present

## 2014-11-06 ENCOUNTER — Ambulatory Visit
Admission: RE | Admit: 2014-11-06 | Discharge: 2014-11-06 | Disposition: A | Discharge status: Home / Self Care | Payer: Medicaid Other | Source: Ambulatory Visit | Attending: Radiation Oncology | Admitting: Radiation Oncology

## 2014-11-06 DIAGNOSIS — Z510 Encounter for antineoplastic radiation therapy: Secondary | ICD-10-CM | POA: Diagnosis not present

## 2014-11-09 ENCOUNTER — Ambulatory Visit
Admission: RE | Admit: 2014-11-09 | Discharge: 2014-11-09 | Disposition: A | Discharge status: Home / Self Care | Payer: Medicaid Other | Source: Ambulatory Visit | Attending: Radiation Oncology | Admitting: Radiation Oncology

## 2014-11-09 VITALS — BP 151/87 | HR 101 | Temp 97.8°F | Wt 119.0 lb

## 2014-11-09 DIAGNOSIS — Z510 Encounter for antineoplastic radiation therapy: Secondary | ICD-10-CM | POA: Diagnosis not present

## 2014-11-09 DIAGNOSIS — C3411 Malignant neoplasm of upper lobe, right bronchus or lung: Secondary | ICD-10-CM

## 2014-11-09 NOTE — Progress Notes (Signed)
Weekly assessment of radiation to right chest and supraclavicular region.Skin dry with minimal skin changes.Pain of left shoulder is scheduled for injection 11/10/14.Megan Sims will follow up with appointment with dr.Szabo in [facility] for skin problems. BP 151/87 mmHg  Pulse 101  Temp(Src) 97.8 F (36.6 C)  Wt 119 lb (53.978 kg)

## 2014-11-09 NOTE — Progress Notes (Signed)
   Weekly Management Note:  Outpatient    ICD-9-CM ICD-10-CM   1. 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  CBCT/MVCT images/Port film x-rays were reviewed.  The chart was checked. Doing relatively well with no significant odynophagia. Weight stable.   Physical Findings:  Wt Readings from Last 3 Encounters:  11/09/14 119 lb (53.978 kg)  11/04/14 120 lb 11.2 oz (54.749 kg)  11/02/14 119 lb 11.2 oz (54.296 kg)    weight is 119 lb (53.978 kg). Her temperature is 97.8 F (36.6 C). Her blood pressure is 151/87 and her pulse is 101.   No radiation changes over back  CBC    Component Value Date/Time   WBC 5.6 11/03/2014 0912   WBC 5.5 10/25/2014 0021   RBC 3.95 11/03/2014 0912   RBC 3.77* 10/25/2014 0021   HGB 11.8 11/03/2014 0912   HGB 12.9 10/25/2014 0032   HCT 36.4 11/03/2014 0912   HCT 38.0 10/25/2014 0032   PLT 255 11/03/2014 0912   PLT 301 10/25/2014 0021   MCV 92.2 11/03/2014 0912   MCV 90.7 10/25/2014 0021   MCH 29.9 11/03/2014 0912   MCH 30.0 10/25/2014 0021   MCHC 32.4 11/03/2014 0912   MCHC 33.0 10/25/2014 0021   RDW 18.3* 11/03/2014 0912   RDW 17.7* 10/25/2014 0021   LYMPHSABS 0.8* 11/03/2014 0912   LYMPHSABS 0.9 10/25/2014 0021   MONOABS 1.0* 11/03/2014 0912   MONOABS 0.7 10/25/2014 0021   EOSABS 0.1 11/03/2014 0912   EOSABS 0.1 10/25/2014 0021   BASOSABS 0.0 11/03/2014 0912   BASOSABS 0.0 10/25/2014 0021     CMP     Component Value Date/Time   NA 140 11/03/2014 0913   NA 141 10/25/2014 0032   K 4.0 11/03/2014 0913   K 3.6 10/25/2014 0032   CL 106 10/25/2014 0032   CO2 24 11/03/2014 0913   CO2 23 10/25/2014 0021   GLUCOSE 89 11/03/2014 0913   GLUCOSE 101* 10/25/2014 0032   BUN 11.4 11/03/2014 0913   BUN 15 10/25/2014 0032   CREATININE 0.8 11/03/2014 0913   CREATININE 0.80 10/25/2014 0032   CALCIUM 9.6  11/03/2014 0913   CALCIUM 8.4* 10/25/2014 0021   PROT 6.7 11/03/2014 0913   PROT 6.6 10/25/2014 0021   ALBUMIN 3.5 11/03/2014 0913   ALBUMIN 3.4* 10/25/2014 0021   AST 18 11/03/2014 0913   AST 20 10/25/2014 0021   ALT 10 11/03/2014 0913   ALT 11* 10/25/2014 0021   ALKPHOS 140 11/03/2014 0913   ALKPHOS 123 10/25/2014 0021   BILITOT <0.30 11/03/2014 0913   BILITOT 0.5 10/25/2014 0021   GFRNONAA >60 10/25/2014 0021   GFRAA >60 10/25/2014 0021     Impression:  The patient is tolerating radiotherapy.   Plan:  Continue radiotherapy as planned.    -----------------------------------  Eppie Gibson, MD

## 2014-11-10 ENCOUNTER — Other Ambulatory Visit (HOSPITAL_BASED_OUTPATIENT_CLINIC_OR_DEPARTMENT_OTHER): Payer: Medicaid Other

## 2014-11-10 ENCOUNTER — Ambulatory Visit
Admission: RE | Admit: 2014-11-10 | Discharge: 2014-11-10 | Disposition: A | Discharge status: Home / Self Care | Payer: Medicaid Other | Source: Ambulatory Visit | Attending: Radiation Oncology | Admitting: Radiation Oncology

## 2014-11-10 ENCOUNTER — Ambulatory Visit
Admission: RE | Admit: 2014-11-10 | Discharge: 2014-11-10 | Disposition: A | Discharge status: Home / Self Care | Payer: Medicaid Other | Source: Ambulatory Visit | Attending: Internal Medicine | Admitting: Internal Medicine

## 2014-11-10 DIAGNOSIS — C3492 Malignant neoplasm of unspecified part of left bronchus or lung: Principal | ICD-10-CM

## 2014-11-10 DIAGNOSIS — Z510 Encounter for antineoplastic radiation therapy: Secondary | ICD-10-CM | POA: Diagnosis not present

## 2014-11-10 DIAGNOSIS — G8929 Other chronic pain: Secondary | ICD-10-CM

## 2014-11-10 DIAGNOSIS — C3491 Malignant neoplasm of unspecified part of right bronchus or lung: Secondary | ICD-10-CM

## 2014-11-10 DIAGNOSIS — M542 Cervicalgia: Principal | ICD-10-CM

## 2014-11-10 LAB — CBC WITH DIFFERENTIAL/PLATELET
BASO%: 0.3 % (ref 0.0–2.0)
Basophils Absolute: 0 10*3/uL (ref 0.0–0.1)
EOS ABS: 0.3 10*3/uL (ref 0.0–0.5)
EOS%: 3.8 % (ref 0.0–7.0)
HEMATOCRIT: 35.8 % (ref 34.8–46.6)
HEMOGLOBIN: 11.8 g/dL (ref 11.6–15.9)
LYMPH#: 0.6 10*3/uL — AB (ref 0.9–3.3)
LYMPH%: 8.4 % — AB (ref 14.0–49.7)
MCH: 30.3 pg (ref 25.1–34.0)
MCHC: 33 g/dL (ref 31.5–36.0)
MCV: 92 fL (ref 79.5–101.0)
MONO#: 0.7 10*3/uL (ref 0.1–0.9)
MONO%: 10 % (ref 0.0–14.0)
NEUT%: 77.5 % — ABNORMAL HIGH (ref 38.4–76.8)
NEUTROS ABS: 5.1 10*3/uL (ref 1.5–6.5)
NRBC: 2 % — AB (ref 0–0)
PLATELETS: 196 10*3/uL (ref 145–400)
RBC: 3.89 10*6/uL (ref 3.70–5.45)
RDW: 18.4 % — AB (ref 11.2–14.5)
WBC: 6.5 10*3/uL (ref 3.9–10.3)

## 2014-11-10 LAB — COMPREHENSIVE METABOLIC PANEL (CC13)
ALT: 10 U/L (ref 0–55)
ANION GAP: 9 meq/L (ref 3–11)
AST: 17 U/L (ref 5–34)
Albumin: 3.5 g/dL (ref 3.5–5.0)
Alkaline Phosphatase: 138 U/L (ref 40–150)
BUN: 13.9 mg/dL (ref 7.0–26.0)
CALCIUM: 9.3 mg/dL (ref 8.4–10.4)
CO2: 26 meq/L (ref 22–29)
Chloride: 105 mEq/L (ref 98–109)
Creatinine: 1 mg/dL (ref 0.6–1.1)
EGFR: 76 mL/min/{1.73_m2} — AB (ref >90)
GLUCOSE: 107 mg/dL (ref 70–140)
POTASSIUM: 3.5 meq/L (ref 3.5–5.1)
Sodium: 140 mEq/L (ref 136–145)
Total Bilirubin: 0.43 mg/dL (ref 0.20–1.20)
Total Protein: 6.8 g/dL (ref 6.4–8.3)

## 2014-11-10 MED ORDER — TRIAMCINOLONE ACETONIDE 40 MG/ML IJ SUSP (RADIOLOGY)
60.0000 mg | Freq: Once | INTRAMUSCULAR | Status: AC
  Administered 2014-11-10: 60 mg via EPIDURAL

## 2014-11-10 MED ORDER — IOHEXOL 300 MG/ML  SOLN
1.0000 mL | Freq: Once | INTRAMUSCULAR | Status: DC | PRN
  Administered 2014-11-10: 1 mL via EPIDURAL

## 2014-11-10 NOTE — Discharge Instructions (Signed)

## 2014-11-11 ENCOUNTER — Ambulatory Visit (HOSPITAL_BASED_OUTPATIENT_CLINIC_OR_DEPARTMENT_OTHER): Payer: Medicaid Other | Admitting: Hematology and Oncology

## 2014-11-11 ENCOUNTER — Telehealth: Payer: Self-pay | Admitting: Hematology and Oncology

## 2014-11-11 ENCOUNTER — Ambulatory Visit
Admission: RE | Admit: 2014-11-11 | Discharge: 2014-11-11 | Disposition: A | Discharge status: Home / Self Care | Payer: Medicaid Other | Source: Ambulatory Visit | Attending: Radiation Oncology | Admitting: Radiation Oncology

## 2014-11-11 ENCOUNTER — Ambulatory Visit (HOSPITAL_BASED_OUTPATIENT_CLINIC_OR_DEPARTMENT_OTHER): Payer: Medicaid Other

## 2014-11-11 ENCOUNTER — Ambulatory Visit: Payer: Medicaid Other | Admitting: Nutrition

## 2014-11-11 VITALS — BP 113/67 | HR 98 | Temp 98.0°F | Resp 18 | Ht 64.0 in | Wt 117.5 lb

## 2014-11-11 DIAGNOSIS — Z5111 Encounter for antineoplastic chemotherapy: Secondary | ICD-10-CM

## 2014-11-11 DIAGNOSIS — Z510 Encounter for antineoplastic radiation therapy: Secondary | ICD-10-CM | POA: Diagnosis not present

## 2014-11-11 DIAGNOSIS — G8929 Other chronic pain: Secondary | ICD-10-CM

## 2014-11-11 DIAGNOSIS — I10 Essential (primary) hypertension: Secondary | ICD-10-CM

## 2014-11-11 DIAGNOSIS — C3492 Malignant neoplasm of unspecified part of left bronchus or lung: Secondary | ICD-10-CM

## 2014-11-11 DIAGNOSIS — C3491 Malignant neoplasm of unspecified part of right bronchus or lung: Secondary | ICD-10-CM | POA: Diagnosis present

## 2014-11-11 DIAGNOSIS — M542 Cervicalgia: Secondary | ICD-10-CM

## 2014-11-11 MED ORDER — FAMOTIDINE IN NACL 20-0.9 MG/50ML-% IV SOLN
20.0000 mg | Freq: Once | INTRAVENOUS | Status: AC
  Administered 2014-11-11: 20 mg via INTRAVENOUS

## 2014-11-11 MED ORDER — DIPHENHYDRAMINE HCL 50 MG/ML IJ SOLN
INTRAMUSCULAR | Status: AC
  Filled 2014-11-11: qty 1

## 2014-11-11 MED ORDER — FAMOTIDINE IN NACL 20-0.9 MG/50ML-% IV SOLN
INTRAVENOUS | Status: AC
  Filled 2014-11-11: qty 50

## 2014-11-11 MED ORDER — PACLITAXEL CHEMO INJECTION 300 MG/50ML
45.0000 mg/m2 | Freq: Once | INTRAVENOUS | Status: AC
  Administered 2014-11-11: 66 mg via INTRAVENOUS
  Filled 2014-11-11: qty 11

## 2014-11-11 MED ORDER — SODIUM CHLORIDE 0.9 % IV SOLN
151.6000 mg | Freq: Once | INTRAVENOUS | Status: AC
  Administered 2014-11-11: 150 mg via INTRAVENOUS
  Filled 2014-11-11: qty 15

## 2014-11-11 MED ORDER — AMLODIPINE BESYLATE 10 MG PO TABS: 10.0000 mg | ORAL_TABLET | Freq: Every day | ORAL | Status: DC

## 2014-11-11 MED ORDER — SODIUM CHLORIDE 0.9 % IV SOLN
Freq: Once | INTRAVENOUS | Status: AC
  Administered 2014-11-11: 11:00:00 via INTRAVENOUS
  Filled 2014-11-11: qty 8

## 2014-11-11 MED ORDER — SODIUM CHLORIDE 0.9 % IV SOLN
Freq: Once | INTRAVENOUS | Status: AC
  Administered 2014-11-11: 11:00:00 via INTRAVENOUS

## 2014-11-11 MED ORDER — HEPARIN SOD (PORK) LOCK FLUSH 100 UNIT/ML IV SOLN
500.0000 [IU] | Freq: Once | INTRAVENOUS | Status: AC | PRN
  Administered 2014-11-11: 500 [IU]
  Filled 2014-11-11: qty 5

## 2014-11-11 MED ORDER — DIPHENHYDRAMINE HCL 50 MG/ML IJ SOLN
50.0000 mg | Freq: Once | INTRAMUSCULAR | Status: AC
  Administered 2014-11-11: 50 mg via INTRAVENOUS

## 2014-11-11 MED ORDER — SODIUM CHLORIDE 0.9 % IJ SOLN
10.0000 mL | INTRAMUSCULAR | Status: DC | PRN
  Administered 2014-11-11: 10 mL
  Filled 2014-11-11: qty 10

## 2014-11-11 NOTE — Telephone Encounter (Signed)
Gave and printed appt sched and avs for pt for OCT and NOV

## 2014-11-11 NOTE — Assessment & Plan Note (Signed)
Her blood pressure is better controlled since I added hydrochlorothiazide. We will continue same treatment

## 2014-11-11 NOTE — Patient Instructions (Signed)
[facility] Discharge Instructions for Patients Receiving Chemotherapy  Today you received the following chemotherapy agents Paclitaxel/Carboplatin.   To help prevent nausea and vomiting after your treatment, we encourage you to take your nausea medication as directed.    If you develop nausea and vomiting that is not controlled by your nausea medication, call the clinic.   BELOW ARE SYMPTOMS THAT SHOULD BE REPORTED IMMEDIATELY:  *FEVER GREATER THAN 100.5 F  *CHILLS WITH OR WITHOUT FEVER  NAUSEA AND VOMITING THAT IS NOT CONTROLLED WITH YOUR NAUSEA MEDICATION  *UNUSUAL SHORTNESS OF BREATH  *UNUSUAL BRUISING OR BLEEDING  TENDERNESS IN MOUTH AND THROAT WITH OR WITHOUT PRESENCE OF ULCERS  *URINARY PROBLEMS  *BOWEL PROBLEMS  UNUSUAL RASH Items with * indicate a potential emergency and should be followed up as soon as possible.  Feel free to call the clinic you have any questions or concerns. The clinic phone number is [phone].  Please show the [facility] at check-in to the Emergency Department and triage nurse.

## 2014-11-11 NOTE — Progress Notes (Signed)
[facility] OFFICE PROGRESS NOTE  Patient Care Team: Rosita Fire, MD as PCP - General (Internal Medicine) Heath Lark, MD as Consulting Physician (Hematology and Oncology) Jodi Marble, MD as Consulting Physician (Otolaryngology) Eppie Gibson, MD as Attending Physician (Radiation Oncology)  SUMMARY OF ONCOLOGIC HISTORY: Oncology History   Bilateral lung cancer   Staging form: Lung, AJCC 6th Edition     Clinical stage from 10/01/2014: Stage IIIB (T4(m), N3, M0) - Signed by Heath Lark, MD on 10/01/2014 Cancer of upper lobe of right lung   Staging form: Lung, AJCC 7th Edition     Clinical: Stage Unknown ([place], N3, M0) - Signed by Eppie Gibson, MD on 10/07/2014       Bilateral lung cancer ([place])   09/05/2014 - 09/12/2014 Hospital Admission The patient was admitted to the hospital for further management of possible skin reaction, dysphagia and supraclavicular lymphadenopathy   09/06/2014 Imaging CT scan of the neck showed malignant appearing superior mediastinal and right thoracic inlet lymphadenopathy   09/10/2014 Imaging CT scan of the abdomen and pelvis confirmed right supraclavicular and mediastinal lymphadenopathy, as described above but no other evidence of metastatic cancer    09/14/2014 Procedure Flexible endoscopy in the ENT office failed to reveal any primary in the head and neck region   09/16/2014 Imaging She failed barium swallow. 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    INTERVAL HISTORY: Please see below for problem oriented charting. She is seen prior to cycle 4 of treatment. She denies recent side effects of treatment. No peripheral neuropathy. She has received an injection to her shoulder with improvement of pain control.   REVIEW OF SYSTEMS:   Constitutional: Denies fevers, chills or abnormal weight loss Eyes: Denies blurriness of vision Ears, nose, mouth, throat, and face: Denies mucositis or sore throat Respiratory: Denies cough, dyspnea or wheezes Cardiovascular: Denies palpitation, chest discomfort or lower extremity swelling Gastrointestinal:  Denies nausea, heartburn or change in bowel habits Skin: Denies abnormal skin rashes Lymphatics: Denies new lymphadenopathy or easy bruising Neurological:Denies numbness, tingling or new weaknesses Behavioral/Psych: Mood is stable, no new changes  All other systems were reviewed with the patient and are negative.  I have reviewed the past medical history, past surgical history, social history and family history with the patient and they are unchanged from previous note.  ALLERGIES:  is allergic to hydrocodone-acetaminophen.  MEDICATIONS:  Current Outpatient Prescriptions  Medication Sig Dispense Refill  . amLODipine (NORVASC) 10 MG tablet Take 1 tablet (10 mg total) by mouth daily. 30 tablet 6  . diphenhydrAMINE (BENADRYL) 25 MG tablet Take 25 mg by mouth every 6 (six) hours as needed.    Marland Kitchen emollient (BIAFINE) cream Apply topically as needed (apply as directed to Radiation field).    . EPINEPHrine 0.3 mg/0.3 mL IJ SOAJ injection Inject 0.3 mLs (0.3  mg total) into the muscle once. 1 Device 0  . hydrochlorothiazide (HYDRODIURIL) 25 MG tablet Take 1 tablet (25 mg total) by mouth  daily. 30 tablet 6  . lidocaine (XYLOCAINE) 2 % solution Patient: Mix 1part 2% viscous lidocaine, 1part H20. Swallow 65m of this mixture, 383m before meals and at bedtime, up to QID 100 mL 5  . lidocaine-prilocaine (EMLA) cream APPLY TO THE AFFECTED AREA ONCE AS DIRECTED BY MD  3  . lisinopril (PRINIVIL,ZESTRIL) 20 MG tablet Take 1 tablet (20 mg total) by mouth daily. 30 tablet 6  . morphine (MSIR) 15 MG tablet Take 1 tablet (15 mg total) by mouth every 6 (six) hours as needed. 60 tablet 0  . polyethylene glycol (MIRALAX) packet Take 17 g by mouth daily. 14 each 0  . sucralfate (CARAFATE) 1 G tablet Dissolve 1 tablet in 10 mL H20 and swallow up to QID for sore throat 60 tablet 5  . ondansetron (ZOFRAN) 8 MG tablet Take 1 tablet (8 mg total) by mouth every 8 (eight) hours as needed. (Patient not taking: Reported on 11/11/2014) 60 tablet 1  . prochlorperazine (COMPAZINE) 10 MG tablet Take 1 tablet (10 mg total) by mouth every 6 (six) hours as needed (Nausea or vomiting). (Patient not taking: Reported on 11/11/2014) 60 tablet 1   No current facility-administered medications for this visit.    PHYSICAL EXAMINATION: ECOG PERFORMANCE STATUS: 0 - Asymptomatic  Filed Vitals:   11/11/14 0925  BP: 113/67  Pulse: 98  Temp: 98 F (36.7 C)  Resp: 18   Filed Weights   11/11/14 0925  Weight: 117 lb 8 oz (53.298 kg)    GENERAL:alert, no distress and comfortable. she looks thin. SKIN: skin color, texture, turgor are normal, no rashes or significant lesions. Prior rash is stable. EYES: normal, Conjunctiva are pink and non-injected, sclera clear OROPHARYNX:no exudate, no erythema and lips, buccal mucosa, and tongue normal  NECK: supple, thyroid normal size, non-tender, without nodularity LYMPH:  no palpable lymphadenopathy in the cervical, axillary or inguinal LUNGS: clear to auscultation and percussion with normal breathing effort HEART: regular rate & rhythm and no murmurs and no lower extremity  edema ABDOMEN:abdomen soft, non-tender and normal bowel sounds Musculoskeletal:no cyanosis of digits and no clubbing  NEURO: alert & oriented x 3 with fluent speech, no focal motor/sensory deficits  LABORATORY DATA:  I have reviewed the data as listed    Component Value Date/Time   NA 140 11/10/2014 0903   NA 141 10/25/2014 0032   K 3.5 11/10/2014 0903   K 3.6 10/25/2014 0032   CL 106 10/25/2014 0032   CO2 26 11/10/2014 0903   CO2 23 10/25/2014 0021   GLUCOSE 107 11/10/2014 0903   GLUCOSE 101* 10/25/2014 0032   BUN 13.9 11/10/2014 0903   BUN 15 10/25/2014 0032   CREATININE 1.0 11/10/2014 0903   CREATININE 0.80 10/25/2014 0032   CALCIUM 9.3 11/10/2014 0903   CALCIUM 8.4* 10/25/2014 0021   PROT 6.8 11/10/2014 0903   PROT 6.6 10/25/2014 0021   ALBUMIN 3.5 11/10/2014 0903   ALBUMIN 3.4* 10/25/2014 0021   AST 17 11/10/2014 0903   AST 20 10/25/2014 0021   ALT 10 11/10/2014 0903   ALT 11* 10/25/2014 0021   ALKPHOS 138 11/10/2014 0903   ALKPHOS 123 10/25/2014 0021   BILITOT 0.43 11/10/2014 0903   BILITOT 0.5 10/25/2014 0021   GFRNONAA >60 10/25/2014 0021   GFRAA >60 10/25/2014 0021    No results found for: SPEP,  UPEP  Lab Results  Component Value Date   WBC 6.5 11/10/2014   NEUTROABS 5.1 11/10/2014   HGB 11.8 11/10/2014   HCT 35.8 11/10/2014   MCV 92.0 11/10/2014   PLT 196 11/10/2014      Chemistry      Component Value Date/Time   NA 140 11/10/2014 0903   NA 141 10/25/2014 0032   K 3.5 11/10/2014 0903   K 3.6 10/25/2014 0032   CL 106 10/25/2014 0032   CO2 26 11/10/2014 0903   CO2 23 10/25/2014 0021   BUN 13.9 11/10/2014 0903   BUN 15 10/25/2014 0032   CREATININE 1.0 11/10/2014 0903   CREATININE 0.80 10/25/2014 0032      Component Value Date/Time   CALCIUM 9.3 11/10/2014 0903   CALCIUM 8.4* 10/25/2014 0021   ALKPHOS 138 11/10/2014 0903   ALKPHOS 123 10/25/2014 0021   AST 17 11/10/2014 0903   AST 20 10/25/2014 0021   ALT 10 11/10/2014 0903   ALT 11*  10/25/2014 0021   BILITOT 0.43 11/10/2014 0903   BILITOT 0.5 10/25/2014 0021       RADIOGRAPHIC STUDIES: I have personally reviewed the radiological images as listed and agreed with the findings in the report. Dg Epidurography  11/10/2014  CLINICAL DATA:  Cervical spondylosis without myelopathy. LEFT arm radicular symptoms. Currently undergoing treatment for malignant neoplasm of the lung. FLUOROSCOPY TIME:  22 seconds.  Dose of 0.3 Gy cm squared PROCEDURE: Informed written consent was obtained.  Time-out was performed. An appropriate skin entry site was chosen, cleansed with Betadine, and anesthetized with 1% lidocaine. CERVICAL EPIDURAL INJECTION An interlaminar approach was performed on the LEFT at C7-T1 . A 20 gauge Crawford epidural needle was advanced using loss-of-resistance technique. DIAGNOSTIC EPIDURAL INJECTION Injection of Omnipaque 300 shows a good epidural pattern with spread above and below the level of needle placement, primarily on the LEFT. No vascular opacification is seen. THERAPEUTIC EPIDURAL INJECTION 1.5 ml of Kenalog 40 mixed with 1 ml of 1% Lidocaine and 2 ml of normal saline were then instilled. The procedure was well-tolerated, and the patient was discharged thirty minutes following the injection in good condition. IMPRESSION: Technically successful for epidural injection on the LEFT at C7-T1. Electronically Signed   By: Staci Righter M.D.   On: 11/10/2014 13:27     ASSESSMENT & PLAN:  Bilateral lung cancer ([place]) She tolerated treatment well without any side effects. We will proceed with treatment without dose adjustment.  Chronic neck pain The patient is not consistent with the pain medicine. Her pain is currently under control. I prohibit her from getting pain medication refill from other physicians. I also recommended she does not take Percocet because it contained Tylenol.  I'll see her back next week for further assessment of pain control    Essential  hypertension Her blood pressure is better controlled since I added hydrochlorothiazide. We will continue same treatment   No orders of the defined types were placed in this encounter.   All questions were answered. The patient knows to call the clinic with any problems, questions or concerns. No barriers to learning was detected. I spent 20 minutes counseling the patient face to face. The total time spent in the appointment was 25 minutes and more than 50% was on counseling and review of test results     [facility], [place], MD 11/11/2014 10:32 AM

## 2014-11-11 NOTE — Assessment & Plan Note (Signed)
She tolerated treatment well without any side effects. We will proceed with treatment without dose adjustment.

## 2014-11-11 NOTE — Progress Notes (Signed)
Nutrition follow-up completed with patient being treated for lung cancer. Patient reports her oral food intake has improved. She is consuming 2 bottles of Ensure Plus daily. Weight documented as 117.5 pounds down slightly from 119.9 pounds. Patient denies nutrition concerns at this time.  Nutrition diagnosis: Unintended weight loss continues.  Intervention:  Provided support and encouragement for patient to continue strategies for increased calories and protein. Recommended patient continue oral nutrition supplements 3 times a day to 4 times a day. Teach back method was used.  Monitoring, evaluation, goals: Patient will continue to consume increased calories and protein and strive for weight maintenance.  Next visit: Patient will contact me for questions or concerns.  **Disclaimer: This note was dictated with voice recognition software. Similar sounding words can inadvertently be transcribed and this note may contain transcription errors which may not have been corrected upon publication of note.**

## 2014-11-11 NOTE — Assessment & Plan Note (Signed)
The patient is not consistent with the pain medicine. Her pain is currently under control. I prohibit her from getting pain medication refill from other physicians. I also recommended she does not take Percocet because it contained Tylenol.  I'll see her back next week for further assessment of pain control

## 2014-11-12 ENCOUNTER — Ambulatory Visit
Admission: RE | Admit: 2014-11-12 | Discharge: 2014-11-12 | Disposition: A | Discharge status: Home / Self Care | Payer: Medicaid Other | Source: Ambulatory Visit | Attending: Radiation Oncology | Admitting: Radiation Oncology

## 2014-11-12 DIAGNOSIS — Z510 Encounter for antineoplastic radiation therapy: Secondary | ICD-10-CM | POA: Diagnosis not present

## 2014-11-13 ENCOUNTER — Ambulatory Visit
Admission: RE | Admit: 2014-11-13 | Discharge: 2014-11-13 | Disposition: A | Discharge status: Home / Self Care | Payer: Medicaid Other | Source: Ambulatory Visit | Attending: Radiation Oncology | Admitting: Radiation Oncology

## 2014-11-13 DIAGNOSIS — Z510 Encounter for antineoplastic radiation therapy: Secondary | ICD-10-CM | POA: Diagnosis not present

## 2014-11-16 ENCOUNTER — Ambulatory Visit
Admission: RE | Admit: 2014-11-16 | Discharge: 2014-11-16 | Disposition: A | Discharge status: Home / Self Care | Payer: Medicaid Other | Source: Ambulatory Visit | Attending: Radiation Oncology | Admitting: Radiation Oncology

## 2014-11-16 ENCOUNTER — Encounter: Payer: Self-pay | Admitting: Radiation Oncology

## 2014-11-16 VITALS — BP 146/86 | HR 101 | Temp 98.2°F | Ht 64.0 in | Wt 117.0 lb

## 2014-11-16 DIAGNOSIS — C3411 Malignant neoplasm of upper lobe, right bronchus or lung: Secondary | ICD-10-CM

## 2014-11-16 DIAGNOSIS — Z510 Encounter for antineoplastic radiation therapy: Secondary | ICD-10-CM | POA: Diagnosis not present

## 2014-11-16 NOTE — Progress Notes (Signed)
Ms. Apuzzo is here for her 23/30 fraction to her Chest, R subclavian areas. She denies any fatigue, and is still able to complete her normal activities. She reports her swallowing is improved, but does modify her diet some by not eating meat she needs to chew completely. She reports some diarrhea 2-3 times a day. She is drinking fluids to avoid dehydration. Her biopsy site on her right collar bone is moist, with a yellow/white base and a slight odor. She reports it had a scab until Thursday, when it fell off while bathing. She reports a thin scab has reformed, but they fall off easily with normal activity. Her skin is hyperpigmented around her right collar bone, and red. She reports it is tender to touch, and she is taking MSIR '15mg'$  for her pain two times a day.   BP 146/86 mmHg  Pulse 101  Temp(Src) 98.2 F (36.8 C)  Ht '5\' 4"'$  (1.626 m)  Wt 117 lb (53.071 kg)  BMI 20.07 kg/m2   Wt Readings from Last 3 Encounters:  11/16/14 117 lb (53.071 kg)  11/11/14 117 lb 8 oz (53.298 kg)  11/09/14 119 lb (53.978 kg)

## 2014-11-16 NOTE — Progress Notes (Signed)
Weekly Management Note:  Outpatient    ICD-9-CM ICD-10-CM   1. Cancer of upper lobe of right lung (HCC) 162.3 C34.11     Current Dose:  46 Gy  Projected Dose: 60 Gy   Narrative:  The patient presents for routine under treatment assessment.  CBCT/MVCT images/Port film x-rays were reviewed.  The chart was checked. She denies any fatigue, and is still able to complete her normal activities. She reports her swallowing is improved, but does modify her diet some by not eating meat she needs to chew completely. She reports some diarrhea 2-3 times a day. She is drinking fluids to avoid dehydration. Her biopsy site on her right collar bone is moist, with a yellow/white base. She reports it had a scab until Thursday, when it fell off while bathing. She reports a thin scab has reformed, but they fall off easily with normal activity. Her skin is hyperpigmented around her right collar bone, and red. She reports it is tender to touch, and she is taking MSIR '15mg'$  for her pain two times a day.  Physical Findings:  Wt Readings from Last 3 Encounters:  11/16/14 117 lb (53.071 kg)  11/11/14 117 lb 8 oz (53.298 kg)  11/09/14 119 lb (53.978 kg)    height is '5\' 4"'$  (1.626 m) and weight is 117 lb (53.071 kg). Her temperature is 98.2 F (36.8 C). Her blood pressure is 146/86 and her pulse is 101.    Her biopsy site on her right collar bone is moist, with a yellow/white base. No active drainage - no skin rash around site  CBC    Component Value Date/Time   WBC 6.5 11/10/2014 0903   WBC 5.5 10/25/2014 0021   RBC 3.89 11/10/2014 0903   RBC 3.77* 10/25/2014 0021   HGB 11.8 11/10/2014 0903   HGB 12.9 10/25/2014 0032   HCT 35.8 11/10/2014 0903   HCT 38.0 10/25/2014 0032   PLT 196 11/10/2014 0903   PLT 301 10/25/2014 0021   MCV 92.0 11/10/2014 0903   MCV 90.7 10/25/2014 0021   MCH 30.3 11/10/2014 0903   MCH 30.0 10/25/2014 0021   MCHC 33.0 11/10/2014 0903   MCHC 33.0 10/25/2014 0021   RDW 18.4* 11/10/2014  0903   RDW 17.7* 10/25/2014 0021   LYMPHSABS 0.6* 11/10/2014 0903   LYMPHSABS 0.9 10/25/2014 0021   MONOABS 0.7 11/10/2014 0903   MONOABS 0.7 10/25/2014 0021   EOSABS 0.3 11/10/2014 0903   EOSABS 0.1 10/25/2014 0021   BASOSABS 0.0 11/10/2014 0903   BASOSABS 0.0 10/25/2014 0021     CMP     Component Value Date/Time   NA 140 11/10/2014 0903   NA 141 10/25/2014 0032   K 3.5 11/10/2014 0903   K 3.6 10/25/2014 0032   CL 106 10/25/2014 0032   CO2 26 11/10/2014 0903   CO2 23 10/25/2014 0021   GLUCOSE 107 11/10/2014 0903   GLUCOSE 101* 10/25/2014 0032   BUN 13.9 11/10/2014 0903   BUN 15 10/25/2014 0032   CREATININE 1.0 11/10/2014 0903   CREATININE 0.80 10/25/2014 0032   CALCIUM 9.3 11/10/2014 0903   CALCIUM 8.4* 10/25/2014 0021   PROT 6.8 11/10/2014 0903   PROT 6.6 10/25/2014 0021   ALBUMIN 3.5 11/10/2014 0903   ALBUMIN 3.4* 10/25/2014 0021   AST 17 11/10/2014 0903   AST 20 10/25/2014 0021   ALT 10 11/10/2014 0903   ALT 11* 10/25/2014 0021   ALKPHOS 138 11/10/2014 0903   ALKPHOS 123 10/25/2014  0021   BILITOT 0.43 11/10/2014 0903   BILITOT 0.5 10/25/2014 0021   GFRNONAA >60 10/25/2014 0021   GFRAA >60 10/25/2014 0021     Impression:  The patient is tolerating radiotherapy.   Plan:  Continue radiotherapy as planned.   Pt given triple antibiotic ointment and bandages for biopsy site. I will notify Dr Alvy Bimler in case she feels antibiotics are warranted when she examines pt on Wednesday.  -----------------------------------  Eppie Gibson, MD

## 2014-11-17 ENCOUNTER — Ambulatory Visit
Admission: RE | Admit: 2014-11-17 | Discharge: 2014-11-17 | Disposition: A | Discharge status: Home / Self Care | Payer: Medicaid Other | Source: Ambulatory Visit | Attending: Radiation Oncology | Admitting: Radiation Oncology

## 2014-11-17 ENCOUNTER — Other Ambulatory Visit (HOSPITAL_BASED_OUTPATIENT_CLINIC_OR_DEPARTMENT_OTHER): Payer: Medicaid Other

## 2014-11-17 DIAGNOSIS — C3491 Malignant neoplasm of unspecified part of right bronchus or lung: Secondary | ICD-10-CM | POA: Diagnosis not present

## 2014-11-17 DIAGNOSIS — Z510 Encounter for antineoplastic radiation therapy: Secondary | ICD-10-CM | POA: Diagnosis not present

## 2014-11-17 DIAGNOSIS — C3492 Malignant neoplasm of unspecified part of left bronchus or lung: Principal | ICD-10-CM

## 2014-11-17 LAB — COMPREHENSIVE METABOLIC PANEL (CC13)
ALT: 10 U/L (ref 0–55)
AST: 16 U/L (ref 5–34)
Albumin: 3.8 g/dL (ref 3.5–5.0)
Alkaline Phosphatase: 143 U/L (ref 40–150)
Anion Gap: 9 mEq/L (ref 3–11)
BUN: 18.8 mg/dL (ref 7.0–26.0)
CALCIUM: 10 mg/dL (ref 8.4–10.4)
CO2: 26 meq/L (ref 22–29)
CREATININE: 0.9 mg/dL (ref 0.6–1.1)
Chloride: 105 mEq/L (ref 98–109)
EGFR: 85 mL/min/{1.73_m2} — ABNORMAL LOW (ref >90)
Glucose: 101 mg/dl (ref 70–140)
Potassium: 4 mEq/L (ref 3.5–5.1)
Sodium: 140 mEq/L (ref 136–145)
Total Bilirubin: 0.4 mg/dL (ref 0.20–1.20)
Total Protein: 7.3 g/dL (ref 6.4–8.3)

## 2014-11-17 LAB — CBC WITH DIFFERENTIAL/PLATELET
BASO%: 0.4 % (ref 0.0–2.0)
BASOS ABS: 0 10*3/uL (ref 0.0–0.1)
EOS%: 3.1 % (ref 0.0–7.0)
Eosinophils Absolute: 0.2 10*3/uL (ref 0.0–0.5)
HEMATOCRIT: 37 % (ref 34.8–46.6)
HGB: 12.1 g/dL (ref 11.6–15.9)
LYMPH#: 0.6 10*3/uL — AB (ref 0.9–3.3)
LYMPH%: 11.9 % — AB (ref 14.0–49.7)
MCH: 30.4 pg (ref 25.1–34.0)
MCHC: 32.7 g/dL (ref 31.5–36.0)
MCV: 93 fL (ref 79.5–101.0)
MONO#: 0.5 10*3/uL (ref 0.1–0.9)
MONO%: 11.1 % (ref 0.0–14.0)
NEUT%: 73.5 % (ref 38.4–76.8)
NEUTROS ABS: 3.5 10*3/uL (ref 1.5–6.5)
Platelets: 230 10*3/uL (ref 145–400)
RBC: 3.98 10*6/uL (ref 3.70–5.45)
RDW: 18.5 % — ABNORMAL HIGH (ref 11.2–14.5)
WBC: 4.8 10*3/uL (ref 3.9–10.3)
nRBC: 2 % — ABNORMAL HIGH (ref 0–0)

## 2014-11-18 ENCOUNTER — Ambulatory Visit (HOSPITAL_BASED_OUTPATIENT_CLINIC_OR_DEPARTMENT_OTHER): Payer: Medicaid Other

## 2014-11-18 ENCOUNTER — Encounter: Payer: Self-pay | Admitting: Hematology and Oncology

## 2014-11-18 ENCOUNTER — Ambulatory Visit (HOSPITAL_BASED_OUTPATIENT_CLINIC_OR_DEPARTMENT_OTHER): Payer: Medicaid Other | Admitting: Hematology and Oncology

## 2014-11-18 ENCOUNTER — Encounter: Payer: Medicaid Other | Admitting: Nutrition

## 2014-11-18 ENCOUNTER — Ambulatory Visit
Admission: RE | Admit: 2014-11-18 | Discharge: 2014-11-18 | Disposition: A | Discharge status: Home / Self Care | Payer: Medicaid Other | Source: Ambulatory Visit | Attending: Radiation Oncology | Admitting: Radiation Oncology

## 2014-11-18 VITALS — BP 130/81 | HR 113 | Temp 98.3°F | Resp 18 | Ht 64.0 in | Wt 117.1 lb

## 2014-11-18 DIAGNOSIS — L98499 Non-pressure chronic ulcer of skin of other sites with unspecified severity: Secondary | ICD-10-CM

## 2014-11-18 DIAGNOSIS — C3491 Malignant neoplasm of unspecified part of right bronchus or lung: Secondary | ICD-10-CM

## 2014-11-18 DIAGNOSIS — L98491 Non-pressure chronic ulcer of skin of other sites limited to breakdown of skin: Secondary | ICD-10-CM | POA: Diagnosis not present

## 2014-11-18 DIAGNOSIS — Z5111 Encounter for antineoplastic chemotherapy: Secondary | ICD-10-CM | POA: Diagnosis not present

## 2014-11-18 DIAGNOSIS — C3492 Malignant neoplasm of unspecified part of left bronchus or lung: Secondary | ICD-10-CM

## 2014-11-18 DIAGNOSIS — Z510 Encounter for antineoplastic radiation therapy: Secondary | ICD-10-CM | POA: Diagnosis not present

## 2014-11-18 HISTORY — DX: Non-pressure chronic ulcer of skin of other sites with unspecified severity: L98.499

## 2014-11-18 MED ORDER — SODIUM CHLORIDE 0.9 % IJ SOLN
10.0000 mL | INTRAMUSCULAR | Status: DC | PRN
  Administered 2014-11-18: 10 mL
  Filled 2014-11-18: qty 10

## 2014-11-18 MED ORDER — SODIUM CHLORIDE 0.9 % IV SOLN
Freq: Once | INTRAVENOUS | Status: AC
  Administered 2014-11-18: 12:00:00 via INTRAVENOUS
  Filled 2014-11-18: qty 8

## 2014-11-18 MED ORDER — HEPARIN SOD (PORK) LOCK FLUSH 100 UNIT/ML IV SOLN
500.0000 [IU] | Freq: Once | INTRAVENOUS | Status: AC | PRN
  Administered 2014-11-18: 500 [IU]
  Filled 2014-11-18: qty 5

## 2014-11-18 MED ORDER — DIPHENHYDRAMINE HCL 50 MG/ML IJ SOLN
50.0000 mg | Freq: Once | INTRAMUSCULAR | Status: AC
  Administered 2014-11-18: 50 mg via INTRAVENOUS

## 2014-11-18 MED ORDER — PACLITAXEL CHEMO INJECTION 300 MG/50ML
45.0000 mg/m2 | Freq: Once | INTRAVENOUS | Status: AC
  Administered 2014-11-18: 66 mg via INTRAVENOUS
  Filled 2014-11-18: qty 11

## 2014-11-18 MED ORDER — SODIUM CHLORIDE 0.9 % IV SOLN
162.8000 mg | Freq: Once | INTRAVENOUS | Status: AC
  Administered 2014-11-18: 160 mg via INTRAVENOUS
  Filled 2014-11-18: qty 16

## 2014-11-18 MED ORDER — FAMOTIDINE IN NACL 20-0.9 MG/50ML-% IV SOLN
20.0000 mg | Freq: Once | INTRAVENOUS | Status: AC
  Administered 2014-11-18: 20 mg via INTRAVENOUS

## 2014-11-18 MED ORDER — FAMOTIDINE IN NACL 20-0.9 MG/50ML-% IV SOLN
INTRAVENOUS | Status: AC
  Filled 2014-11-18: qty 50

## 2014-11-18 MED ORDER — SODIUM CHLORIDE 0.9 % IV SOLN
Freq: Once | INTRAVENOUS | Status: AC
  Administered 2014-11-18: 11:00:00 via INTRAVENOUS

## 2014-11-18 MED ORDER — DIPHENHYDRAMINE HCL 50 MG/ML IJ SOLN
INTRAMUSCULAR | Status: AC
  Filled 2014-11-18: qty 1

## 2014-11-18 MED ORDER — CEPHALEXIN 500 MG PO CAPS: 500.0000 mg | ORAL_CAPSULE | Freq: Three times a day (TID) | ORAL | Status: DC

## 2014-11-18 NOTE — Patient Instructions (Signed)
[facility] Discharge Instructions for Patients Receiving Chemotherapy  Today you received the following chemotherapy agents taxol/carboplatin  To help prevent nausea and vomiting after your treatment, we encourage you to take your nausea medication as directed   If you develop nausea and vomiting that is not controlled by your nausea medication, call the clinic.   BELOW ARE SYMPTOMS THAT SHOULD BE REPORTED IMMEDIATELY:  *FEVER GREATER THAN 100.5 F  *CHILLS WITH OR WITHOUT FEVER  NAUSEA AND VOMITING THAT IS NOT CONTROLLED WITH YOUR NAUSEA MEDICATION  *UNUSUAL SHORTNESS OF BREATH  *UNUSUAL BRUISING OR BLEEDING  TENDERNESS IN MOUTH AND THROAT WITH OR WITHOUT PRESENCE OF ULCERS  *URINARY PROBLEMS  *BOWEL PROBLEMS  UNUSUAL RASH Items with * indicate a potential emergency and should be followed up as soon as possible.  Feel free to call the clinic you have any questions or concerns. The clinic phone number is [phone]. a

## 2014-11-19 ENCOUNTER — Ambulatory Visit
Admission: RE | Admit: 2014-11-19 | Discharge: 2014-11-19 | Disposition: A | Discharge status: Home / Self Care | Payer: Medicaid Other | Source: Ambulatory Visit | Attending: Radiation Oncology | Admitting: Radiation Oncology

## 2014-11-19 ENCOUNTER — Encounter: Payer: Self-pay | Admitting: Hematology and Oncology

## 2014-11-19 DIAGNOSIS — Z510 Encounter for antineoplastic radiation therapy: Secondary | ICD-10-CM | POA: Diagnosis not present

## 2014-11-19 NOTE — Progress Notes (Signed)
[facility] OFFICE PROGRESS NOTE  Patient Care Team: Rosita Fire, MD as PCP - General (Internal Medicine) Heath Lark, MD as Consulting Physician (Hematology and Oncology) Jodi Marble, MD as Consulting Physician (Otolaryngology) Eppie Gibson, MD as Attending Physician (Radiation Oncology)  SUMMARY OF ONCOLOGIC HISTORY: Oncology History   Bilateral lung cancer   Staging form: Lung, AJCC 6th Edition     Clinical stage from 10/01/2014: Stage IIIB (T4(m), N3, M0) - Signed by Heath Lark, MD on 10/01/2014 Cancer of upper lobe of right lung   Staging form: Lung, AJCC 7th Edition     Clinical: Stage Unknown ([place], N3, M0) - Signed by Eppie Gibson, MD on 10/07/2014       Bilateral lung cancer ([place])   09/05/2014 - 09/12/2014 Hospital Admission The patient was admitted to the hospital for further management of possible skin reaction, dysphagia and supraclavicular lymphadenopathy   09/06/2014 Imaging CT scan of the neck showed malignant appearing superior mediastinal and right thoracic inlet lymphadenopathy   09/10/2014 Imaging CT scan of the abdomen and pelvis confirmed right supraclavicular and mediastinal lymphadenopathy, as described above but no other evidence of metastatic cancer    09/14/2014 Procedure Flexible endoscopy in the ENT office failed to reveal any primary in the head and neck region   09/16/2014 Imaging She failed barium swallow. 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    INTERVAL HISTORY: Please see below for problem oriented charting. She is seen prior to chemotherapy. She is doing well except she has new skin breakdown over the supraclavicular region. It happened last few days. She denies recent cough. No swallowing difficulties. She is eating well. Her pain is under control. Her blood pressure is under excellent control. She denies peripheral neuropathy.  REVIEW OF SYSTEMS:   Constitutional: Denies fevers, chills or abnormal weight loss Eyes: Denies blurriness of vision Ears, nose, mouth, throat, and face: Denies mucositis or sore throat Respiratory: Denies cough, dyspnea or wheezes Cardiovascular: Denies palpitation, chest discomfort or lower extremity swelling Gastrointestinal:  Denies nausea, heartburn or change in bowel habits Lymphatics: Denies new lymphadenopathy or easy bruising Neurological:Denies numbness, tingling or new weaknesses Behavioral/Psych: Mood is stable, no new changes  All other systems were reviewed with the patient and are negative.  I have reviewed the past medical history, past surgical history, social history and family history with the patient and they are unchanged from previous note.  ALLERGIES:  is allergic to hydrocodone-acetaminophen.  MEDICATIONS:  Current Outpatient Prescriptions  Medication Sig Dispense Refill  . amLODipine (NORVASC) 10 MG tablet Take 1 tablet (10 mg total) by mouth daily. 30 tablet 6  . cephALEXin (KEFLEX) 500 MG capsule Take 1 capsule (500 mg total) by mouth 3 (three) times daily. 21 capsule 0  . diphenhydrAMINE (BENADRYL) 25 MG tablet Take 25 mg by mouth every 6 (  six) hours as needed.    Marland Kitchen emollient (BIAFINE) cream Apply topically as needed (apply as directed to  Radiation field).    . EPINEPHrine 0.3 mg/0.3 mL IJ SOAJ injection Inject 0.3 mLs (0.3 mg total) into the muscle once. 1 Device 0  . hydrochlorothiazide (HYDRODIURIL) 25 MG tablet Take 1 tablet (25 mg total) by mouth daily. 30 tablet 6  . lidocaine (XYLOCAINE) 2 % solution Patient: Mix 1part 2% viscous lidocaine, 1part H20. Swallow 60m of this mixture, 356m before meals and at bedtime, up to QID 100 mL 5  . lidocaine-prilocaine (EMLA) cream APPLY TO THE AFFECTED AREA ONCE AS DIRECTED BY MD  3  . lisinopril (PRINIVIL,ZESTRIL) 20 MG tablet Take 1 tablet (20 mg total) by mouth daily. 30 tablet 6  . morphine (MSIR) 15 MG tablet Take 1 tablet (15 mg total) by mouth every 6 (six) hours as needed. 60 tablet 0  . ondansetron (ZOFRAN) 8 MG tablet Take 1 tablet (8 mg total) by mouth every 8 (eight) hours as needed. (Patient not taking: Reported on 11/16/2014) 60 tablet 1  . polyethylene glycol (MIRALAX) packet Take 17 g by mouth daily. 14 each 0  . prochlorperazine (COMPAZINE) 10 MG tablet Take 1 tablet (10 mg total) by mouth every 6 (six) hours as needed (Nausea or vomiting). (Patient not taking: Reported on 11/11/2014) 60 tablet 1  . sucralfate (CARAFATE) 1 G tablet Dissolve 1 tablet in 10 mL H20 and swallow up to QID for sore throat (Patient not taking: Reported on 11/16/2014) 60 tablet 5   No current facility-administered medications for this visit.    PHYSICAL EXAMINATION: ECOG PERFORMANCE STATUS: 1 - Symptomatic but completely ambulatory  Filed Vitals:   11/18/14 1032  BP: 130/81  Pulse: 113  Temp: 98.3 F (36.8 C)  Resp: 18   Filed Weights   11/18/14 1032  Weight: 117 lb 1.6 oz (53.116 kg)    GENERAL:alert, no distress and comfortable SKIN: There is open skin lesion over the right supraclavicular region. It has a dry and clean based without clinical signs of infection. I educated the patient how to put a clean dressing over it. EYES: normal, Conjunctiva are pink and non-injected,  sclera clear OROPHARYNX:no exudate, no erythema and lips, buccal mucosa, and tongue normal  NECK: supple, thyroid normal size, non-tender, without nodularity LYMPH:  no palpable lymphadenopathy in the cervical, axillary or inguinal LUNGS: clear to auscultation and percussion with normal breathing effort HEART: regular rate & rhythm and no murmurs and no lower extremity edema ABDOMEN:abdomen soft, non-tender and normal bowel sounds Musculoskeletal:no cyanosis of digits and no clubbing  NEURO: alert & oriented x 3 with fluent speech, no focal motor/sensory deficits  LABORATORY DATA:  I have reviewed the data as listed    Component Value Date/Time   NA 140 11/17/2014 0910   NA 141 10/25/2014 0032   K 4.0 11/17/2014 0910   K 3.6 10/25/2014 0032   CL 106 10/25/2014 0032   CO2 26 11/17/2014 0910   CO2 23 10/25/2014 0021   GLUCOSE 101 11/17/2014 0910   GLUCOSE 101* 10/25/2014 0032   BUN 18.8 11/17/2014 0910   BUN 15 10/25/2014 0032   CREATININE 0.9 11/17/2014 0910   CREATININE 0.80 10/25/2014 0032   CALCIUM 10.0 11/17/2014 0910   CALCIUM 8.4* 10/25/2014 0021   PROT 7.3 11/17/2014 0910   PROT 6.6 10/25/2014 0021   ALBUMIN 3.8 11/17/2014 0910   ALBUMIN 3.4* 10/25/2014 0021   AST 16 11/17/2014 0910  AST 20 10/25/2014 0021   ALT 10 11/17/2014 0910   ALT 11* 10/25/2014 0021   ALKPHOS 143 11/17/2014 0910   ALKPHOS 123 10/25/2014 0021   BILITOT 0.40 11/17/2014 0910   BILITOT 0.5 10/25/2014 0021   GFRNONAA >60 10/25/2014 0021   GFRAA >60 10/25/2014 0021    No results found for: SPEP, UPEP  Lab Results  Component Value Date   WBC 4.8 11/17/2014   NEUTROABS 3.5 11/17/2014   HGB 12.1 11/17/2014   HCT 37.0 11/17/2014   MCV 93.0 11/17/2014   PLT 230 11/17/2014      Chemistry      Component Value Date/Time   NA 140 11/17/2014 0910   NA 141 10/25/2014 0032   K 4.0 11/17/2014 0910   K 3.6 10/25/2014 0032   CL 106 10/25/2014 0032   CO2 26 11/17/2014 0910   CO2 23 10/25/2014  0021   BUN 18.8 11/17/2014 0910   BUN 15 10/25/2014 0032   CREATININE 0.9 11/17/2014 0910   CREATININE 0.80 10/25/2014 0032      Component Value Date/Time   CALCIUM 10.0 11/17/2014 0910   CALCIUM 8.4* 10/25/2014 0021   ALKPHOS 143 11/17/2014 0910   ALKPHOS 123 10/25/2014 0021   AST 16 11/17/2014 0910   AST 20 10/25/2014 0021   ALT 10 11/17/2014 0910   ALT 11* 10/25/2014 0021   BILITOT 0.40 11/17/2014 0910   BILITOT 0.5 10/25/2014 0021      ASSESSMENT & PLAN:  Bilateral lung cancer ([place]) She tolerated treatment well up off from mild skin excoriation from side effects of treatment. I will proceed with treatment without dose adjustment.  Skin ulcer ([place]) She has skin breakdown over the right supraclavicular region at her prior biopsy site. The wound is clean and dry. There is no clinical signs of infection. I educated the patient ways to put clean dressing over it. I gave her a prescription of antibiotic to hang onto but would not recommend she start it unless it appears clinically infected.    No orders of the defined types were placed in this encounter.   All questions were answered. The patient knows to call the clinic with any problems, questions or concerns. No barriers to learning was detected. I spent 25 minutes counseling the patient face to face. The total time spent in the appointment was 30 minutes and more than 50% was on counseling and review of test results     [facility], [place], MD 11/19/2014 7:33 AM

## 2014-11-19 NOTE — Assessment & Plan Note (Signed)
She tolerated treatment well up off from mild skin excoriation from side effects of treatment. I will proceed with treatment without dose adjustment.

## 2014-11-19 NOTE — Assessment & Plan Note (Signed)
She has skin breakdown over the right supraclavicular region at her prior biopsy site. The wound is clean and dry. There is no clinical signs of infection. I educated the patient ways to put clean dressing over it. I gave her a prescription of antibiotic to hang onto but would not recommend she start it unless it appears clinically infected.

## 2014-11-20 ENCOUNTER — Encounter (HOSPITAL_COMMUNITY): Payer: Self-pay | Admitting: Emergency Medicine

## 2014-11-20 ENCOUNTER — Ambulatory Visit
Admission: RE | Admit: 2014-11-20 | Discharge: 2014-11-20 | Disposition: A | Discharge status: Home / Self Care | Payer: Medicaid Other | Source: Ambulatory Visit | Attending: Radiation Oncology | Admitting: Radiation Oncology

## 2014-11-20 ENCOUNTER — Emergency Department (HOSPITAL_COMMUNITY): Payer: Medicaid Other

## 2014-11-20 ENCOUNTER — Emergency Department (HOSPITAL_COMMUNITY)
Admission: EM | Admit: 2014-11-20 | Discharge: 2014-11-21 | Disposition: A | Discharge status: Home / Self Care | Payer: Medicaid Other | Attending: Emergency Medicine | Admitting: Emergency Medicine

## 2014-11-20 DIAGNOSIS — N898 Other specified noninflammatory disorders of vagina: Secondary | ICD-10-CM

## 2014-11-20 DIAGNOSIS — Z87891 Personal history of nicotine dependence: Secondary | ICD-10-CM | POA: Diagnosis not present

## 2014-11-20 DIAGNOSIS — Z8739 Personal history of other diseases of the musculoskeletal system and connective tissue: Secondary | ICD-10-CM | POA: Diagnosis not present

## 2014-11-20 DIAGNOSIS — Z8619 Personal history of other infectious and parasitic diseases: Secondary | ICD-10-CM | POA: Insufficient documentation

## 2014-11-20 DIAGNOSIS — L089 Local infection of the skin and subcutaneous tissue, unspecified: Secondary | ICD-10-CM | POA: Diagnosis not present

## 2014-11-20 DIAGNOSIS — G43909 Migraine, unspecified, not intractable, without status migrainosus: Secondary | ICD-10-CM | POA: Insufficient documentation

## 2014-11-20 DIAGNOSIS — N939 Abnormal uterine and vaginal bleeding, unspecified: Secondary | ICD-10-CM | POA: Diagnosis present

## 2014-11-20 DIAGNOSIS — T148XXA Other injury of unspecified body region, initial encounter: Secondary | ICD-10-CM

## 2014-11-20 DIAGNOSIS — Z859 Personal history of malignant neoplasm, unspecified: Secondary | ICD-10-CM | POA: Insufficient documentation

## 2014-11-20 DIAGNOSIS — Z8709 Personal history of other diseases of the respiratory system: Secondary | ICD-10-CM | POA: Insufficient documentation

## 2014-11-20 DIAGNOSIS — D649 Anemia, unspecified: Secondary | ICD-10-CM

## 2014-11-20 DIAGNOSIS — Z79899 Other long term (current) drug therapy: Secondary | ICD-10-CM | POA: Insufficient documentation

## 2014-11-20 DIAGNOSIS — I10 Essential (primary) hypertension: Secondary | ICD-10-CM | POA: Insufficient documentation

## 2014-11-20 DIAGNOSIS — G8929 Other chronic pain: Secondary | ICD-10-CM | POA: Insufficient documentation

## 2014-11-20 DIAGNOSIS — Z510 Encounter for antineoplastic radiation therapy: Secondary | ICD-10-CM | POA: Diagnosis not present

## 2014-11-20 DIAGNOSIS — Z792 Long term (current) use of antibiotics: Secondary | ICD-10-CM | POA: Diagnosis not present

## 2014-11-20 LAB — CBC WITH DIFFERENTIAL/PLATELET
Basophils Absolute: 0 10*3/uL (ref 0.0–0.1)
Basophils Relative: 0 %
EOS PCT: 2 %
Eosinophils Absolute: 0.1 10*3/uL (ref 0.0–0.7)
HEMATOCRIT: 32.4 % — AB (ref 36.0–46.0)
Hemoglobin: 10.7 g/dL — ABNORMAL LOW (ref 12.0–15.0)
LYMPHS ABS: 0.3 10*3/uL — AB (ref 0.7–4.0)
LYMPHS PCT: 9 %
MCH: 30.6 pg (ref 26.0–34.0)
MCHC: 33 g/dL (ref 30.0–36.0)
MCV: 92.6 fL (ref 78.0–100.0)
Monocytes Absolute: 0.4 10*3/uL (ref 0.1–1.0)
Monocytes Relative: 12 %
NEUTROS ABS: 2.8 10*3/uL (ref 1.7–7.7)
NEUTROS PCT: 77 %
Platelets: 233 10*3/uL (ref 150–400)
RBC: 3.5 MIL/uL — AB (ref 3.87–5.11)
RDW: 18.8 % — ABNORMAL HIGH (ref 11.5–15.5)
WBC: 3.6 10*3/uL — AB (ref 4.0–10.5)

## 2014-11-20 LAB — BASIC METABOLIC PANEL
Anion gap: 8 (ref 5–15)
BUN: 20 mg/dL (ref 6–20)
CHLORIDE: 102 mmol/L (ref 101–111)
CO2: 29 mmol/L (ref 22–32)
Calcium: 9 mg/dL (ref 8.9–10.3)
Creatinine, Ser: 1.01 mg/dL — ABNORMAL HIGH (ref 0.44–1.00)
GFR calc non Af Amer: 60 mL/min — ABNORMAL LOW (ref >60)
Glucose, Bld: 100 mg/dL — ABNORMAL HIGH (ref 65–99)
POTASSIUM: 3.4 mmol/L — AB (ref 3.5–5.1)
SODIUM: 139 mmol/L (ref 135–145)

## 2014-11-20 MED ORDER — SODIUM CHLORIDE 0.9 % IV SOLN: INTRAVENOUS | Status: DC

## 2014-11-20 MED ORDER — ONDANSETRON HCL 4 MG/2ML IJ SOLN
4.0000 mg | Freq: Once | INTRAMUSCULAR | Status: AC
  Administered 2014-11-20: 4 mg via INTRAVENOUS
  Filled 2014-11-20: qty 2

## 2014-11-20 MED ORDER — HYDROMORPHONE HCL 1 MG/ML IJ SOLN
1.0000 mg | INTRAMUSCULAR | Status: DC | PRN
  Filled 2014-11-20: qty 1

## 2014-11-20 NOTE — ED Notes (Signed)
Pt arrived to the ED from home with a complain t of vaginal bleeding for a week.  Pt states that it began as spotting but progressed to dark brown discharge.  Pt used toilet paper to clean it up and states that the episodes numbered 4 or 5 times and were concurrent with her urine output.  Pt also has a spot on her neck that is her biopsy site.  Pt states thea the spot never healed and that it has progressively gotten worse since she started chemotherapy and radiation.  Pt is also complaining of her abdomen hurting and her right leg as well.

## 2014-11-20 NOTE — ED Provider Notes (Signed)
CSN: [id number]     Arrival date & time 11/20/14  2108 History   First MD Initiated Contact with Patient 11/20/14 2147     Chief Complaint  Patient presents with  . Vaginal Bleeding  . Wound Check     (Consider location/radiation/quality/duration/timing/severity/associated sxs/prior Treatment) HPI   Megan Sims is a 59 y.o. female who presents for evaluation of chest discomfort associated with a skin wound, and aggravated by radiation treatment, which is ongoing. She has also noticed some vaginal bleeding, over the last several days. She is taking her usual medications, without relief. She denies cough, shortness of breath, weakness or dizziness. She is taking her usual pain medications, without relief. There are no other known modifying factors.   Past Medical History  Diagnosis Date  . Migraine   . Lung abnormality   . Hypertension   . Pleurisy   . Cerebral aneurysm 2 brain surgeries 96 or 97  . Paget disease of bone   . Diarrhea   . Renal insufficiency     Patient states " no kidney problems  . Malignant neoplasm of unknown origin ([place])   . S/P biopsy     of throat per patient.  . Oral thrush 09/24/2014  . Nicotine dependence 09/24/2014  . Chronic neck pain 09/24/2014  . Cancer [facility])     malignant neoplasm of unknown origin  . Skin ulcer ([place]) 11/18/2014   Past Surgical History  Procedure Laterality Date  . Chest tube insertion    . Tubal ligation    . Cerebral aneurysm repair  96 or 97  . Colonoscopy     Family History  Problem Relation Age of Onset  . Hypertension    . Diabetes    . Kidney disease    . Cancer Mother     throat ca  . Cancer Maternal Grandmother     thyroid ca   Social History  Substance Use Topics  . Smoking status: Former Smoker -- 0.50 packs/day for 34 years    Types: Cigarettes  . Smokeless tobacco: Never Used  . Alcohol Use: No   OB History    Gravida Para Term Preterm AB TAB SAB Ectopic Multiple Living            3     Review of  Systems  All other systems reviewed and are negative.     Allergies  Hydrocodone-acetaminophen  Home Medications   Prior to Admission medications   Medication Sig Start Date End Date Taking? Authorizing Provider  amLODipine (NORVASC) 10 MG tablet Take 1 tablet (10 mg total) by mouth daily. 11/11/14  Yes Heath Lark, MD  cephALEXin (KEFLEX) 500 MG capsule Take 1 capsule (500 mg total) by mouth 3 (three) times daily. Patient taking differently: Take 500 mg by mouth 3 (three) times daily. Starting 11/20/14 for 7 days. 11/18/14  Yes Heath Lark, MD  diphenhydrAMINE (BENADRYL) 25 MG tablet Take 25 mg by mouth every 6 (six) hours as needed for itching or sleep.    Yes Historical Provider, MD  emollient (BIAFINE) cream Apply topically as needed (apply as directed to Radiation field).   Yes Historical Provider, MD  EPINEPHrine 0.3 mg/0.3 mL IJ SOAJ injection Inject 0.3 mLs (0.3 mg total) into the muscle once. 09/12/14  Yes Cherene Altes, MD  hydrochlorothiazide (HYDRODIURIL) 25 MG tablet Take 1 tablet (25 mg total) by mouth daily. 11/04/14  Yes Heath Lark, MD  lidocaine (XYLOCAINE) 2 % solution Patient: Mix 1part 2% viscous  lidocaine, 1part H20. Swallow 30m of this mixture, 3105m before meals and at bedtime, up to QID 11/02/14  Yes SaEppie GibsonMD  lidocaine-prilocaine (EMLA) cream Apply to the affected area as directed. 10/12/14  Yes Historical Provider, MD  lisinopril (PRINIVIL,ZESTRIL) 20 MG tablet Take 1 tablet (20 mg total) by mouth daily. 10/12/14  Yes NiHeath LarkMD  morphine (MSIR) 15 MG tablet Take 1 tablet (15 mg total) by mouth every 6 (six) hours as needed. Patient taking differently: Take 15 mg by mouth every 6 (six) hours as needed for moderate pain or severe pain.  10/01/14  Yes NiHeath LarkMD  polyethylene glycol (MIRALAX) packet Take 17 g by mouth daily. 10/01/14  Yes NiHeath LarkMD  [facility]   Yes Historical Provider, MD  ondansetron (ZOFRAN) 8  MG tablet Take 1 tablet (8 mg total) by mouth every 8 (eight) hours as needed. Patient not taking: Reported on 11/20/2014 10/12/14   NiHeath LarkMD  prochlorperazine (COMPAZINE) 10 MG tablet Take 1 tablet (10 mg total) by mouth every 6 (six) hours as needed (Nausea or vomiting). Patient not taking: Reported on 11/20/2014 10/12/14   NiHeath LarkMD  sucralfate (CARAFATE) 1 G tablet Dissolve 1 tablet in 10 mL H20 and swallow up to QID for sore throat Patient not taking: Reported on 11/16/2014 11/02/14   SaEppie GibsonMD   BP 129/79 mmHg  Pulse 107  Temp(Src) 98.7 F (37.1 C) (Oral)  Resp 16  SpO2 98% Physical Exam  Constitutional: She is oriented to person, place, and time. She appears well-developed.  Somewhat malnourished. Appears older than stated age.  HENT:  Head: Normocephalic and atraumatic.  Right Ear: External ear normal.  Left Ear: External ear normal.  Eyes: Conjunctivae and EOM are normal. Pupils are equal, round, and reactive to light.  Neck: Normal range of motion and phonation normal. Neck supple.  Cardiovascular: Normal rate, regular rhythm and normal heart sounds.   Pulmonary/Chest: Effort normal and breath sounds normal. She exhibits tenderness (Right upper, mild, no associated crepitation.). She exhibits no bony tenderness.  Abdominal: Soft. There is no tenderness.  Genitourinary:  Normal external female genitalia. Moderate pain on insertion of speculum. Purulent vaginal discharge, with blood. Bimanual examination deferred secondary to pain. Anal exam-small amount of brown stool in rectal vault. No rectal mass.  Musculoskeletal: Normal range of motion.  Neurological: She is alert and oriented to person, place, and time. No cranial nerve deficit or sensory deficit. She exhibits normal muscle tone. Coordination normal.  Skin: Skin is warm, dry and intact.  Superficial draining wound, 2 x 4 cm, right supraclavicular region without fluctuance or significant tenderness.   Psychiatric: She has a normal mood and affect. Her behavior is normal. Judgment and thought content normal.  Nursing note and vitals reviewed.   ED Course  Procedures (including critical care time) Medications  0.9 %  sodium chloride infusion (not administered)  HYDROmorphone (DILAUDID) injection 1 mg (not administered)  ondansetron (ZOFRAN) injection 4 mg (not administered)  cefTRIAXone (ROCEPHIN) 1 g in dextrose 5 % 50 mL IVPB (not administered)  azithromycin (ZITHROMAX) tablet 1,000 mg (not administered)    Patient Vitals for the past 24 hrs:  BP Temp Temp src Pulse Resp SpO2  11/20/14 2128 129/79 mmHg 98.7 F (37.1 C) Oral 107 16 98 %         Labs Review Labs Reviewed  BASIC METABOLIC PANEL - Abnormal; Notable for the following:  Potassium 3.4 (*)    Glucose, Bld 100 (*)    Creatinine, Ser 1.01 (*)    GFR calc non Af Amer 60 (*)    All other components within normal limits  CBC WITH DIFFERENTIAL/PLATELET - Abnormal; Notable for the following:    WBC 3.6 (*)    RBC 3.50 (*)    Hemoglobin 10.7 (*)    HCT 32.4 (*)    RDW 18.8 (*)    Lymphs Abs 0.3 (*)    All other components within normal limits  WET PREP, GENITAL  RPR  HIV ANTIBODY (ROUTINE TESTING)  POC OCCULT BLOOD, ED  GC/CHLAMYDIA PROBE AMP ([place]) NOT AT [place]  Date Value Ref Range Status  11/20/2014 10.7* 12.0 - 15.0 g/dL Final  10/25/2014 12.9 12.0 - 15.0 g/dL Final  10/25/2014 11.3* 12.0 - 15.0 g/dL Final  10/08/2014 11.3* 12.0 - 15.0 g/dL Final   HGB  Date Value Ref Range Status  11/17/2014 12.1 11.6 - 15.9 g/dL Final  11/10/2014 11.8 11.6 - 15.9 g/dL Final  11/03/2014 11.8 11.6 - 15.9 g/dL Final  10/20/2014 11.5* 11.6 - 15.9 g/dL Final     Imaging Review Dg Chest Port 1 View  11/20/2014  CLINICAL DATA:  Midportion chest pain.  History of lung carcinoma EXAM: PORTABLE CHEST 1 VIEW COMPARISON:  October 25, 2014 chest radiograph and chest CT October 25, 2014 FINDINGS:  There is a nipple shadow at the left base. There is no edema or consolidation. The heart size and pulmonary vascularity are normal. Adenopathy seen on recent chest CT is not well seen by radiography. Port-A-Cath tip is in the superior vena cava. No pneumothorax. Paget's disease at T11 and L1 is apparent but better seen on recent CT. IMPRESSION: No edema or consolidation. Port-A-Cath tip in superior vena cava. No pneumothorax. The adenopathy seen on recent CT examination is not well seen by radiography. Electronically Signed   By: Lowella Grip III M.D.   On: 11/20/2014 23:22   I have personally reviewed and evaluated these images and lab results as part of my medical decision-making.   EKG Interpretation None      MDM   Final diagnoses:  Wound infection ([place])  Vaginal discharge  Anemia, unspecified anemia type    Wound infection, right supraclavicular area, patient started Keflex, yesterday. Vaginal discharge consistent with STD, treated with Rocephin and Zithromax. Mild incidental anemia, unlikely to be related to vaginal discharge. Fecal occult is negative.   Nursing Notes Reviewed/ Care Coordinated Applicable Imaging Reviewed Interpretation of Laboratory Data incorporated into ED treatment  The patient appears reasonably screened and/or stabilized for discharge and I doubt any other medical condition or other [facility] requiring further screening, evaluation, or treatment in the ED at this time prior to discharge.  Plan: Home Medications- add Percocet for additional pain control; Home Treatments- heat to sore area, right supraclavicular; return here if the recommended treatment, does not improve the symptoms; Recommended follow up- PCP and oncology follow-up, next week   Daleen Bo, MD 11/22/14 [phone]

## 2014-11-21 LAB — WET PREP, GENITAL
Clue Cells Wet Prep HPF POC: NONE SEEN
YEAST WET PREP: NONE SEEN

## 2014-11-21 LAB — POC OCCULT BLOOD, ED: FECAL OCCULT BLD: NEGATIVE

## 2014-11-21 LAB — HIV ANTIBODY (ROUTINE TESTING W REFLEX): HIV Screen 4th Generation wRfx: NONREACTIVE

## 2014-11-21 LAB — RPR: RPR Ser Ql: NONREACTIVE

## 2014-11-21 MED ORDER — OXYCODONE-ACETAMINOPHEN 5-325 MG PO TABS: 1.0000 | ORAL_TABLET | ORAL | Status: DC | PRN

## 2014-11-21 MED ORDER — AZITHROMYCIN 250 MG PO TABS
1000.0000 mg | ORAL_TABLET | Freq: Once | ORAL | Status: AC
  Administered 2014-11-21: 1000 mg via ORAL
  Filled 2014-11-21: qty 4

## 2014-11-21 MED ORDER — HEPARIN SOD (PORK) LOCK FLUSH 100 UNIT/ML IV SOLN
500.0000 [IU] | Freq: Once | INTRAVENOUS | Status: AC
  Administered 2014-11-21: 500 [IU]

## 2014-11-21 MED ORDER — HEPARIN SOD (PORK) LOCK FLUSH 100 UNIT/ML IV SOLN
500.0000 [IU] | Freq: Once | INTRAVENOUS | Status: DC
  Filled 2014-11-21: qty 5

## 2014-11-21 MED ORDER — METRONIDAZOLE 500 MG PO TABS: 2000.0000 mg | ORAL_TABLET | Freq: Once | ORAL | Status: DC

## 2014-11-21 MED ORDER — DEXTROSE 5 % IV SOLN
1.0000 g | Freq: Once | INTRAVENOUS | Status: AC
  Administered 2014-11-21: 1 g via INTRAVENOUS
  Filled 2014-11-21: qty 10

## 2014-11-21 NOTE — ED Provider Notes (Signed)
16:00- I discussed exam evaluation findings with the patient on the phone today, she needs additional treatment for trichomonas. I sent an electronic prescription for Flagyl 500 mg #4 to take as a single dose, to treat Trichomonas. Patient states that today she feels better, and that she will pick up the prescription at the pharmacy.  Daleen Bo, MD 11/21/14 [phone]

## 2014-11-23 ENCOUNTER — Ambulatory Visit
Admission: RE | Admit: 2014-11-23 | Discharge: 2014-11-23 | Disposition: A | Discharge status: Home / Self Care | Payer: Medicaid Other | Source: Ambulatory Visit | Attending: Radiation Oncology | Admitting: Radiation Oncology

## 2014-11-23 VITALS — BP 123/83 | Temp 98.0°F | Ht 64.0 in | Wt 116.4 lb

## 2014-11-23 DIAGNOSIS — G8929 Other chronic pain: Secondary | ICD-10-CM | POA: Insufficient documentation

## 2014-11-23 DIAGNOSIS — C3492 Malignant neoplasm of unspecified part of left bronchus or lung: Secondary | ICD-10-CM | POA: Diagnosis not present

## 2014-11-23 DIAGNOSIS — M542 Cervicalgia: Secondary | ICD-10-CM | POA: Insufficient documentation

## 2014-11-23 DIAGNOSIS — L98491 Non-pressure chronic ulcer of skin of other sites limited to breakdown of skin: Secondary | ICD-10-CM | POA: Insufficient documentation

## 2014-11-23 DIAGNOSIS — C3411 Malignant neoplasm of upper lobe, right bronchus or lung: Secondary | ICD-10-CM

## 2014-11-23 DIAGNOSIS — Z510 Encounter for antineoplastic radiation therapy: Secondary | ICD-10-CM | POA: Diagnosis not present

## 2014-11-23 DIAGNOSIS — I10 Essential (primary) hypertension: Secondary | ICD-10-CM | POA: Insufficient documentation

## 2014-11-23 DIAGNOSIS — C3491 Malignant neoplasm of unspecified part of right bronchus or lung: Secondary | ICD-10-CM | POA: Insufficient documentation

## 2014-11-23 LAB — GC/CHLAMYDIA PROBE AMP (~~LOC~~) NOT AT ARMC
Chlamydia: NEGATIVE
Neisseria Gonorrhea: NEGATIVE

## 2014-11-23 MED ORDER — SILVER SULFADIAZINE 1 % EX CREA
1.0000 | TOPICAL_CREAM | Freq: Every day | CUTANEOUS | Status: DC
Start: 2014-11-23 — End: 2014-11-24
  Administered 2014-11-23: 1 via TOPICAL

## 2014-11-23 NOTE — Progress Notes (Signed)
Ms. Mcadams presents for her 28th fraction of radiation to her chest/ R subclavian area. She was recently in the ED for increased drainage at her Right clavicle Biopsy site which she received one dose of IV antibiotic. She had been given an antibiotic the previous Wednesday by Dr. Alvy Bimler, but was unable to take it due to an upset stomach. After her ED visit she was given flagyl for one dose. Currently her wound is larger than last week with yellow drainage present. Her dressing she had on today, has a small amount of greenish/yellowish drainage present. She does report she is not eating as much as she normally does, and reports her energy level is good.   BP 123/83 mmHg  Temp(Src) 98 F (36.7 C)  Ht '5\' 4"'$  (1.626 m)  Wt 116 lb 6.4 oz (52.799 kg)  BMI 19.97 kg/m2   Wt Readings from Last 3 Encounters:  11/23/14 116 lb 6.4 oz (52.799 kg)  11/18/14 117 lb 1.6 oz (53.116 kg)  11/16/14 117 lb (53.071 kg)

## 2014-11-23 NOTE — Progress Notes (Signed)
Weekly Management Note:  Outpatient    ICD-9-CM ICD-10-CM   1. 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  CBCT/MVCT images/Port film x-rays were reviewed.  The chart was checked.  Megan Sims presents for her 28th fraction of radiation to her chest/ R subclavian area. She was recently in the ED for increased drainage at her Right clavicle AND vaginal discharge for which she received one dose of IV antibiotic. She had been given an antibiotic the previous Wednesday by Dr. Alvy Bimler, but was unable to take it due to an upset stomach. After her ED visit she was given flagyl for one dose.  She does report she is not eating as much as she normally does, and reports her energy level is good.    Physical Findings:  Wt Readings from Last 3 Encounters:  11/23/14 116 lb 6.4 oz (52.799 kg)  11/18/14 117 lb 1.6 oz (53.116 kg)  11/16/14 117 lb (53.071 kg)    height is '5\' 4"'$  (1.626 m) and weight is 116 lb 6.4 oz (52.799 kg). Her temperature is 98 F (36.7 C). Her blood pressure is 123/83.    Moist desquamation at R SCV region. Skin dry and intact elsewhere in RT fields.  CBC    Component Value Date/Time   WBC 3.6* 11/20/2014 2254   WBC 4.8 11/17/2014 0910   RBC 3.50* 11/20/2014 2254   RBC 3.98 11/17/2014 0910   HGB 10.7* 11/20/2014 2254   HGB 12.1 11/17/2014 0910   HCT 32.4* 11/20/2014 2254   HCT 37.0 11/17/2014 0910   PLT 233 11/20/2014 2254   PLT 230 11/17/2014 0910   MCV 92.6 11/20/2014 2254   MCV 93.0 11/17/2014 0910   MCH 30.6 11/20/2014 2254   MCH 30.4 11/17/2014 0910   MCHC 33.0 11/20/2014 2254   MCHC 32.7 11/17/2014 0910   RDW 18.8* 11/20/2014 2254   RDW 18.5* 11/17/2014 0910   LYMPHSABS 0.3* 11/20/2014 2254   LYMPHSABS 0.6* 11/17/2014 0910   MONOABS 0.4 11/20/2014 2254   MONOABS 0.5  11/17/2014 0910   EOSABS 0.1 11/20/2014 2254   EOSABS 0.2 11/17/2014 0910   BASOSABS 0.0 11/20/2014 2254   BASOSABS 0.0 11/17/2014 0910     CMP     Component Value Date/Time   NA 139 11/20/2014 2254   NA 140 11/17/2014 0910   K 3.4* 11/20/2014 2254   K 4.0 11/17/2014 0910   CL 102 11/20/2014 2254   CO2 29 11/20/2014 2254   CO2 26 11/17/2014 0910   GLUCOSE 100* 11/20/2014 2254   GLUCOSE 101 11/17/2014 0910   BUN 20 11/20/2014 2254   BUN 18.8 11/17/2014 0910   CREATININE 1.01* 11/20/2014 2254   CREATININE 0.9 11/17/2014 0910   CALCIUM 9.0 11/20/2014 2254   CALCIUM 10.0 11/17/2014 0910   PROT 7.3 11/17/2014 0910   PROT 6.6 10/25/2014 0021   ALBUMIN 3.8 11/17/2014 0910   ALBUMIN 3.4* 10/25/2014 0021   AST 16 11/17/2014 0910   AST 20 10/25/2014 0021   ALT 10 11/17/2014 0910   ALT 11* 10/25/2014 0021   ALKPHOS 143 11/17/2014 0910   ALKPHOS 123 10/25/2014 0021   BILITOT 0.40 11/17/2014 0910   BILITOT 0.5 10/25/2014 0021   GFRNONAA 60* 11/20/2014 2254   GFRAA >60 11/20/2014 2254  Impression:  The patient is tolerating radiotherapy.   Plan:  Continue radiotherapy as planned.   Pt seen at ED with vaginal bleeding - per notes, d/c was purulent with blood, c/w an STD.  Started on metronidazole.  I am going to hold off on a gyn/onc referral but if she has recurrent bleeding I may refer.  The moist desquamation at the right SCV is largely due to RT.  I gave her silavdene creme today.  She stopped oral ABX due to vomiting them. I  defer to med/onc to prescribe another ABX if necessary, though I don't see any active infection today.  She is taking oxycodone/acetaminophen from the ED for pain. Pt knows I defer to Dr Alvy Bimler to provide new RX for narcotics in future  F/u in 19mo-----------------------------------  SEppie Gibson MD

## 2014-11-24 ENCOUNTER — Other Ambulatory Visit (HOSPITAL_BASED_OUTPATIENT_CLINIC_OR_DEPARTMENT_OTHER): Payer: Medicaid Other

## 2014-11-24 ENCOUNTER — Ambulatory Visit
Admission: RE | Admit: 2014-11-24 | Discharge: 2014-11-24 | Disposition: A | Discharge status: Home / Self Care | Payer: Medicaid Other | Source: Ambulatory Visit | Attending: Radiation Oncology | Admitting: Radiation Oncology

## 2014-11-24 DIAGNOSIS — C3492 Malignant neoplasm of unspecified part of left bronchus or lung: Secondary | ICD-10-CM

## 2014-11-24 DIAGNOSIS — C3491 Malignant neoplasm of unspecified part of right bronchus or lung: Secondary | ICD-10-CM

## 2014-11-24 DIAGNOSIS — Z510 Encounter for antineoplastic radiation therapy: Secondary | ICD-10-CM | POA: Diagnosis not present

## 2014-11-24 LAB — COMPREHENSIVE METABOLIC PANEL (CC13)
ALBUMIN: 3.7 g/dL (ref 3.5–5.0)
ALK PHOS: 133 U/L (ref 40–150)
ALT: <9 U/L (ref 0–55)
ANION GAP: 8 meq/L (ref 3–11)
AST: 16 U/L (ref 5–34)
BUN: 13 mg/dL (ref 7.0–26.0)
CALCIUM: 10 mg/dL (ref 8.4–10.4)
CO2: 28 mEq/L (ref 22–29)
Chloride: 104 mEq/L (ref 98–109)
Creatinine: 0.9 mg/dL (ref 0.6–1.1)
EGFR: 81 mL/min/{1.73_m2} — AB (ref >90)
Glucose: 96 mg/dl (ref 70–140)
POTASSIUM: 3.8 meq/L (ref 3.5–5.1)
SODIUM: 140 meq/L (ref 136–145)
TOTAL PROTEIN: 7 g/dL (ref 6.4–8.3)

## 2014-11-24 LAB — CBC WITH DIFFERENTIAL/PLATELET
BASO%: 0.3 % (ref 0.0–2.0)
BASOS ABS: 0 10*3/uL (ref 0.0–0.1)
EOS ABS: 0 10*3/uL (ref 0.0–0.5)
EOS%: 1.3 % (ref 0.0–7.0)
HCT: 34.1 % — ABNORMAL LOW (ref 34.8–46.6)
HEMOGLOBIN: 11.3 g/dL — AB (ref 11.6–15.9)
LYMPH#: 0.4 10*3/uL — AB (ref 0.9–3.3)
LYMPH%: 13.3 % — ABNORMAL LOW (ref 14.0–49.7)
MCH: 30.8 pg (ref 25.1–34.0)
MCHC: 33.1 g/dL (ref 31.5–36.0)
MCV: 92.9 fL (ref 79.5–101.0)
MONO#: 0.4 10*3/uL (ref 0.1–0.9)
MONO%: 12.4 % (ref 0.0–14.0)
NEUT#: 2.3 10*3/uL (ref 1.5–6.5)
NEUT%: 72.7 % (ref 38.4–76.8)
NRBC: 1 % — AB (ref 0–0)
PLATELETS: 245 10*3/uL (ref 145–400)
RBC: 3.67 10*6/uL — ABNORMAL LOW (ref 3.70–5.45)
RDW: 19 % — AB (ref 11.2–14.5)
WBC: 3.2 10*3/uL — ABNORMAL LOW (ref 3.9–10.3)

## 2014-11-25 ENCOUNTER — Ambulatory Visit (HOSPITAL_BASED_OUTPATIENT_CLINIC_OR_DEPARTMENT_OTHER): Payer: Medicaid Other | Admitting: Hematology and Oncology

## 2014-11-25 ENCOUNTER — Encounter: Payer: Self-pay | Admitting: Hematology and Oncology

## 2014-11-25 ENCOUNTER — Telehealth: Payer: Self-pay | Admitting: Hematology and Oncology

## 2014-11-25 ENCOUNTER — Ambulatory Visit (HOSPITAL_BASED_OUTPATIENT_CLINIC_OR_DEPARTMENT_OTHER): Payer: Medicaid Other

## 2014-11-25 ENCOUNTER — Ambulatory Visit
Admission: RE | Admit: 2014-11-25 | Discharge: 2014-11-25 | Disposition: A | Discharge status: Home / Self Care | Payer: Medicaid Other | Source: Ambulatory Visit | Attending: Radiation Oncology | Admitting: Radiation Oncology

## 2014-11-25 ENCOUNTER — Encounter: Payer: Self-pay | Admitting: Radiation Oncology

## 2014-11-25 VITALS — BP 159/90 | HR 102 | Temp 98.2°F | Resp 18 | Ht 64.0 in | Wt 118.3 lb

## 2014-11-25 DIAGNOSIS — C3492 Malignant neoplasm of unspecified part of left bronchus or lung: Secondary | ICD-10-CM

## 2014-11-25 DIAGNOSIS — Z5111 Encounter for antineoplastic chemotherapy: Secondary | ICD-10-CM

## 2014-11-25 DIAGNOSIS — C3491 Malignant neoplasm of unspecified part of right bronchus or lung: Secondary | ICD-10-CM

## 2014-11-25 DIAGNOSIS — I10 Essential (primary) hypertension: Secondary | ICD-10-CM | POA: Diagnosis not present

## 2014-11-25 DIAGNOSIS — G8929 Other chronic pain: Secondary | ICD-10-CM

## 2014-11-25 DIAGNOSIS — M542 Cervicalgia: Secondary | ICD-10-CM

## 2014-11-25 DIAGNOSIS — L98491 Non-pressure chronic ulcer of skin of other sites limited to breakdown of skin: Secondary | ICD-10-CM | POA: Diagnosis not present

## 2014-11-25 DIAGNOSIS — Z510 Encounter for antineoplastic radiation therapy: Secondary | ICD-10-CM | POA: Diagnosis not present

## 2014-11-25 MED ORDER — SODIUM CHLORIDE 0.9 % IV SOLN
Freq: Once | INTRAVENOUS | Status: AC
  Administered 2014-11-25: 11:00:00 via INTRAVENOUS

## 2014-11-25 MED ORDER — DIPHENHYDRAMINE HCL 50 MG/ML IJ SOLN
INTRAMUSCULAR | Status: AC
  Filled 2014-11-25: qty 1

## 2014-11-25 MED ORDER — HEPARIN SOD (PORK) LOCK FLUSH 100 UNIT/ML IV SOLN
500.0000 [IU] | Freq: Once | INTRAVENOUS | Status: AC | PRN
  Administered 2014-11-25: 500 [IU]
  Filled 2014-11-25: qty 5

## 2014-11-25 MED ORDER — DIPHENHYDRAMINE HCL 50 MG/ML IJ SOLN
50.0000 mg | Freq: Once | INTRAMUSCULAR | Status: AC
Start: 2014-11-25 — End: 2014-11-25
  Administered 2014-11-25: 50 mg via INTRAVENOUS

## 2014-11-25 MED ORDER — SODIUM CHLORIDE 0.9 % IV SOLN
162.8000 mg | Freq: Once | INTRAVENOUS | Status: AC
  Administered 2014-11-25: 160 mg via INTRAVENOUS
  Filled 2014-11-25: qty 16

## 2014-11-25 MED ORDER — SODIUM CHLORIDE 0.9 % IJ SOLN
10.0000 mL | INTRAMUSCULAR | Status: DC | PRN
  Administered 2014-11-25: 10 mL
  Filled 2014-11-25: qty 10

## 2014-11-25 MED ORDER — PACLITAXEL CHEMO INJECTION 300 MG/50ML
45.0000 mg/m2 | Freq: Once | INTRAVENOUS | Status: AC
  Administered 2014-11-25: 66 mg via INTRAVENOUS
  Filled 2014-11-25: qty 11

## 2014-11-25 MED ORDER — FAMOTIDINE IN NACL 20-0.9 MG/50ML-% IV SOLN
INTRAVENOUS | Status: AC
  Filled 2014-11-25: qty 50

## 2014-11-25 MED ORDER — FAMOTIDINE IN NACL 20-0.9 MG/50ML-% IV SOLN
20.0000 mg | Freq: Once | INTRAVENOUS | Status: AC
Start: 2014-11-25 — End: 2014-11-25
  Administered 2014-11-25: 20 mg via INTRAVENOUS

## 2014-11-25 MED ORDER — SODIUM CHLORIDE 0.9 % IV SOLN
Freq: Once | INTRAVENOUS | Status: AC
  Administered 2014-11-25: 11:00:00 via INTRAVENOUS
  Filled 2014-11-25: qty 8

## 2014-11-25 NOTE — Assessment & Plan Note (Signed)
The patient is not consistent with the pain medicine. Her pain is currently under control. I prohibit her from getting pain medication refill from other physicians. I also recommended she does not take Percocet because it contained Tylenol.  I'll see her back in 2 weeks for further assessment of pain control

## 2014-11-25 NOTE — Telephone Encounter (Signed)
per piof to sch pt appt-gave pt copy of avs

## 2014-11-25 NOTE — Assessment & Plan Note (Signed)
She has skin breakdown over the right supraclavicular region at her prior biopsy site. The wound is clean and dry. There is no clinical signs of infection. I educated the patient ways to put clean dressing over it. No need antibiotics

## 2014-11-25 NOTE — Telephone Encounter (Signed)
per pof to sch pt appt-gave pt copy of avs °

## 2014-11-25 NOTE — Assessment & Plan Note (Signed)
She tolerated treatment well up off from mild skin excoriation from side effects of treatment. I will proceed with treatment without dose adjustment.

## 2014-11-25 NOTE — Patient Instructions (Signed)
[facility] Discharge Instructions for Patients Receiving Chemotherapy  Today you received the following chemotherapy agents Taxol/Carboplatin To help prevent nausea and vomiting after your treatment, we encourage you to take your nausea medication as prescribed.   If you develop nausea and vomiting that is not controlled by your nausea medication, call the clinic.   BELOW ARE SYMPTOMS THAT SHOULD BE REPORTED IMMEDIATELY:  *FEVER GREATER THAN 100.5 F  *CHILLS WITH OR WITHOUT FEVER  NAUSEA AND VOMITING THAT IS NOT CONTROLLED WITH YOUR NAUSEA MEDICATION  *UNUSUAL SHORTNESS OF BREATH  *UNUSUAL BRUISING OR BLEEDING  TENDERNESS IN MOUTH AND THROAT WITH OR WITHOUT PRESENCE OF ULCERS  *URINARY PROBLEMS  *BOWEL PROBLEMS  UNUSUAL RASH Items with * indicate a potential emergency and should be followed up as soon as possible.  Feel free to call the clinic you have any questions or concerns. The clinic phone number is [phone].  Please show the CHEMO ALERT CARD at check-in to the Emergency Department and triage nurse.   

## 2014-11-25 NOTE — Progress Notes (Signed)
[facility] OFFICE PROGRESS NOTE  Patient Care Team: Rosita Fire, MD as PCP - General (Internal Medicine) Heath Lark, MD as Consulting Physician (Hematology and Oncology) Jodi Marble, MD as Consulting Physician (Otolaryngology) Eppie Gibson, MD as Attending Physician (Radiation Oncology)  SUMMARY OF ONCOLOGIC HISTORY: Oncology History   Bilateral lung cancer   Staging form: Lung, AJCC 6th Edition     Clinical stage from 10/01/2014: Stage IIIB (T4(m), N3, M0) - Signed by Heath Lark, MD on 10/01/2014 Cancer of upper lobe of right lung   Staging form: Lung, AJCC 7th Edition     Clinical: Stage Unknown (TX, N3, M0) - Signed by Eppie Gibson, MD on 10/07/2014       Bilateral lung cancer ([place])   09/05/2014 - 09/12/2014 Hospital Admission The patient was admitted to the hospital for further management of possible skin reaction, dysphagia and supraclavicular lymphadenopathy   09/06/2014 Imaging CT scan of the neck showed malignant appearing superior mediastinal and right thoracic inlet lymphadenopathy   09/10/2014 Imaging CT scan of the abdomen and pelvis confirmed right supraclavicular and mediastinal lymphadenopathy, as described above but no other evidence of metastatic cancer    09/14/2014 Procedure Flexible endoscopy in the ENT office failed to reveal any primary in the head and neck region   09/16/2014 Imaging She failed barium swallow. 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    INTERVAL HISTORY: Please see below for problem oriented charting. She is seen prior to chemotherapy. She is doing well except she has persistent skin breakdown over the supraclavicular region. She denies recent cough. No swallowing difficulties. She is eating well. Her pain is under control. Her blood pressure is under excellent control. She denies peripheral neuropathy.  REVIEW OF SYSTEMS:   Constitutional: Denies fevers, chills or abnormal weight loss Eyes: Denies blurriness of vision Ears, nose, mouth, throat, and face: Denies mucositis or sore throat Respiratory: Denies cough, dyspnea or wheezes Cardiovascular: Denies palpitation, chest discomfort or lower extremity swelling Gastrointestinal:  Denies nausea, heartburn or change in bowel habits Lymphatics: Denies new lymphadenopathy or easy bruising Neurological:Denies numbness, tingling or new weaknesses Behavioral/Psych: Mood is stable, no new changes  All other systems were reviewed with the patient and are negative.  I have reviewed the past medical history, past surgical history, social history and family history with the patient and they are unchanged from previous note.  ALLERGIES:  is allergic to hydrocodone-acetaminophen.  MEDICATIONS:  Current Outpatient Prescriptions  Medication Sig Dispense Refill  . amLODipine (NORVASC) 10 MG tablet Take 1 tablet (10 mg total) by mouth daily. 30 tablet 6  . diphenhydrAMINE (BENADRYL) 25 MG tablet Take 25 mg by mouth every 6 (six) hours as needed for itching or sleep.     Marland Kitchen emollient (BIAFINE) cream Apply topically as needed (apply as directed to Radiation field).    Marland Kitchen  EPINEPHrine 0.3 mg/0.3 mL IJ SOAJ injection Inject 0.3 mLs (0.3 mg total) into the muscle  once. 1 Device 0  . hydrochlorothiazide (HYDRODIURIL) 25 MG tablet Take 1 tablet (25 mg total) by mouth daily. 30 tablet 6  . lidocaine (XYLOCAINE) 2 % solution Patient: Mix 1part 2% viscous lidocaine, 1part H20. Swallow 27m of this mixture, 329m before meals and at bedtime, up to QID 100 mL 5  . lidocaine-prilocaine (EMLA) cream Apply to the affected area as directed.  3  . lisinopril (PRINIVIL,ZESTRIL) 20 MG tablet Take 1 tablet (20 mg total) by mouth daily. 30 tablet 6  . ondansetron (ZOFRAN) 8 MG tablet Take 1 tablet (8 mg total) by mouth every 8 (eight) hours as needed. 60 tablet 1  . oxyCODONE-acetaminophen (PERCOCET) 5-325 MG tablet Take 1 tablet by mouth every 4 (four) hours as needed for severe pain. 20 tablet 0  . polyethylene glycol (MIRALAX) packet Take 17 g by mouth daily. 14 each 0  . [facility]    . Marland Kitchenrochlorperazine (COMPAZINE) 10 MG tablet Take 1 tablet (10 mg total) by mouth every 6 (six) hours as needed (Nausea or vomiting). 60 tablet 1  . silver sulfADIAZINE (SILVADENE) 1 % cream Apply 1 application topically 2 (two) times daily.    . sucralfate (CARAFATE) 1 G tablet Dissolve 1 tablet in 10 mL H20 and swallow up to QID for sore throat 60 tablet 5  . morphine (MSIR) 15 MG tablet Take 1 tablet (15 mg total) by mouth every 6 (six) hours as needed. (Patient not taking: Reported on 11/25/2014) 60 tablet 0   No current facility-administered medications for this visit.    PHYSICAL EXAMINATION: ECOG PERFORMANCE STATUS: 1 - Symptomatic but completely ambulatory  Filed Vitals:   11/25/14 0916  BP: 159/90  Pulse: 102  Temp: 98.2 F (36.8 C)  Resp: 18   Filed Weights   11/25/14 0916  Weight: 118 lb 4.8 oz (53.661 kg)    GENERAL:alert, no distress and comfortable SKIN: She had persistent nonhealing skin ulcer with clean base and no evidence of infection or cellulitis EYES: normal, Conjunctiva are pink and non-injected, sclera  clear OROPHARYNX:no exudate, no erythema and lips, buccal mucosa, and tongue normal  NECK: supple, thyroid normal size, non-tender, without nodularity LYMPH:  no palpable lymphadenopathy in the cervical, axillary or inguinal LUNGS: clear to auscultation and percussion with normal breathing effort HEART: regular rate & rhythm and no murmurs and no lower extremity edema ABDOMEN:abdomen soft, non-tender and normal bowel sounds Musculoskeletal:no cyanosis of digits and no clubbing  NEURO: alert & oriented x 3 with fluent speech, no focal motor/sensory deficits  LABORATORY DATA:  I have reviewed the data as listed    Component Value Date/Time   NA 140 11/24/2014 0939   NA 139 11/20/2014 2254   K 3.8 11/24/2014 0939   K 3.4* 11/20/2014 2254   CL 102 11/20/2014 2254   CO2 28 11/24/2014 0939   CO2 29 11/20/2014 2254   GLUCOSE 96 11/24/2014 0939   GLUCOSE 100* 11/20/2014 2254   BUN 13.0 11/24/2014 0939   BUN 20 11/20/2014 2254   CREATININE 0.9 11/24/2014 0939   CREATININE 1.01* 11/20/2014 2254   CALCIUM 10.0 11/24/2014 0939   CALCIUM 9.0 11/20/2014 2254   PROT 7.0 11/24/2014 0939   PROT 6.6 10/25/2014 0021   ALBUMIN 3.7 11/24/2014 0939   ALBUMIN 3.4* 10/25/2014 0021   AST 16 11/24/2014 0939   AST 20 10/25/2014 0021  ALT <9 11/24/2014 0939   ALT 11* 10/25/2014 0021   ALKPHOS 133 11/24/2014 0939   ALKPHOS 123 10/25/2014 0021   BILITOT <0.30 11/24/2014 0939   BILITOT 0.5 10/25/2014 0021   GFRNONAA 60* 11/20/2014 2254   GFRAA >60 11/20/2014 2254    No results found for: SPEP, UPEP  Lab Results  Component Value Date   WBC 3.2* 11/24/2014   NEUTROABS 2.3 11/24/2014   HGB 11.3* 11/24/2014   HCT 34.1* 11/24/2014   MCV 92.9 11/24/2014   PLT 245 11/24/2014      Chemistry      Component Value Date/Time   NA 140 11/24/2014 0939   NA 139 11/20/2014 2254   K 3.8 11/24/2014 0939   K 3.4* 11/20/2014 2254   CL 102 11/20/2014 2254   CO2 28 11/24/2014 0939   CO2 29 11/20/2014  2254   BUN 13.0 11/24/2014 0939   BUN 20 11/20/2014 2254   CREATININE 0.9 11/24/2014 0939   CREATININE 1.01* 11/20/2014 2254      Component Value Date/Time   CALCIUM 10.0 11/24/2014 0939   CALCIUM 9.0 11/20/2014 2254   ALKPHOS 133 11/24/2014 0939   ALKPHOS 123 10/25/2014 0021   AST 16 11/24/2014 0939   AST 20 10/25/2014 0021   ALT <9 11/24/2014 0939   ALT 11* 10/25/2014 0021   BILITOT <0.30 11/24/2014 0939   BILITOT 0.5 10/25/2014 0021      ASSESSMENT & PLAN:  Bilateral lung cancer ([place]) She tolerated treatment well up off from mild skin excoriation from side effects of treatment. I will proceed with treatment without dose adjustment.  Chronic neck pain The patient is not consistent with the pain medicine. Her pain is currently under control. I prohibit her from getting pain medication refill from other physicians. I also recommended she does not take Percocet because it contained Tylenol.  I'll see her back in 2 weeks for further assessment of pain control    Skin ulcer ([place]) She has skin breakdown over the right supraclavicular region at her prior biopsy site. The wound is clean and dry. There is no clinical signs of infection. I educated the patient ways to put clean dressing over it. No need antibiotics   Essential hypertension Her blood pressure is better controlled since I added hydrochlorothiazide. We will continue same treatment     No orders of the defined types were placed in this encounter.   All questions were answered. The patient knows to call the clinic with any problems, questions or concerns. No barriers to learning was detected. I spent 25 minutes counseling the patient face to face. The total time spent in the appointment was 30 minutes and more than 50% was on counseling and review of test results     [facility], [place], MD 11/25/2014 9:44 AM  \

## 2014-11-25 NOTE — Assessment & Plan Note (Signed)
Her blood pressure is better controlled since I added hydrochlorothiazide. We will continue same treatment

## 2014-11-26 ENCOUNTER — Ambulatory Visit: Payer: Medicaid Other

## 2014-11-27 ENCOUNTER — Ambulatory Visit: Payer: Medicaid Other

## 2014-11-30 ENCOUNTER — Ambulatory Visit: Payer: Medicaid Other

## 2014-12-10 ENCOUNTER — Encounter: Payer: Self-pay | Admitting: Hematology and Oncology

## 2014-12-10 ENCOUNTER — Other Ambulatory Visit (HOSPITAL_BASED_OUTPATIENT_CLINIC_OR_DEPARTMENT_OTHER): Payer: Medicaid Other

## 2014-12-10 ENCOUNTER — Ambulatory Visit (HOSPITAL_BASED_OUTPATIENT_CLINIC_OR_DEPARTMENT_OTHER): Payer: Medicaid Other | Admitting: Hematology and Oncology

## 2014-12-10 ENCOUNTER — Telehealth: Payer: Self-pay | Admitting: Hematology and Oncology

## 2014-12-10 VITALS — BP 150/80 | HR 98 | Temp 98.1°F | Resp 18 | Ht 64.0 in | Wt 119.1 lb

## 2014-12-10 DIAGNOSIS — C3492 Malignant neoplasm of unspecified part of left bronchus or lung: Secondary | ICD-10-CM | POA: Diagnosis not present

## 2014-12-10 DIAGNOSIS — G8929 Other chronic pain: Secondary | ICD-10-CM | POA: Diagnosis not present

## 2014-12-10 DIAGNOSIS — I10 Essential (primary) hypertension: Secondary | ICD-10-CM | POA: Diagnosis not present

## 2014-12-10 DIAGNOSIS — C3491 Malignant neoplasm of unspecified part of right bronchus or lung: Secondary | ICD-10-CM

## 2014-12-10 DIAGNOSIS — L98491 Non-pressure chronic ulcer of skin of other sites limited to breakdown of skin: Secondary | ICD-10-CM | POA: Diagnosis not present

## 2014-12-10 DIAGNOSIS — M542 Cervicalgia: Secondary | ICD-10-CM

## 2014-12-10 LAB — CBC WITH DIFFERENTIAL/PLATELET
BASO%: 0.2 % (ref 0.0–2.0)
Basophils Absolute: 0 10*3/uL (ref 0.0–0.1)
EOS ABS: 0.1 10*3/uL (ref 0.0–0.5)
EOS%: 1.1 % (ref 0.0–7.0)
HCT: 36.1 % (ref 34.8–46.6)
HEMOGLOBIN: 11.7 g/dL (ref 11.6–15.9)
LYMPH%: 16.9 % (ref 14.0–49.7)
MCH: 31.3 pg (ref 25.1–34.0)
MCHC: 32.4 g/dL (ref 31.5–36.0)
MCV: 96.5 fL (ref 79.5–101.0)
MONO#: 0.9 10*3/uL (ref 0.1–0.9)
MONO%: 19.7 % — AB (ref 0.0–14.0)
NEUT%: 62.1 % (ref 38.4–76.8)
NEUTROS ABS: 2.7 10*3/uL (ref 1.5–6.5)
Platelets: 186 10*3/uL (ref 145–400)
RBC: 3.74 10*6/uL (ref 3.70–5.45)
RDW: 20.6 % — AB (ref 11.2–14.5)
WBC: 4.4 10*3/uL (ref 3.9–10.3)
lymph#: 0.7 10*3/uL — ABNORMAL LOW (ref 0.9–3.3)

## 2014-12-10 LAB — COMPREHENSIVE METABOLIC PANEL (CC13)
ALBUMIN: 3.6 g/dL (ref 3.5–5.0)
ALK PHOS: 136 U/L (ref 40–150)
ALT: <9 U/L (ref 0–55)
AST: 17 U/L (ref 5–34)
Anion Gap: 10 mEq/L (ref 3–11)
BUN: 10 mg/dL (ref 7.0–26.0)
CO2: 29 mEq/L (ref 22–29)
CREATININE: 0.9 mg/dL (ref 0.6–1.1)
Calcium: 9.6 mg/dL (ref 8.4–10.4)
Chloride: 103 mEq/L (ref 98–109)
EGFR: 78 mL/min/{1.73_m2} — AB (ref >90)
GLUCOSE: 83 mg/dL (ref 70–140)
Potassium: 3.4 mEq/L — ABNORMAL LOW (ref 3.5–5.1)
SODIUM: 142 meq/L (ref 136–145)
TOTAL PROTEIN: 7.2 g/dL (ref 6.4–8.3)

## 2014-12-10 MED ORDER — LISINOPRIL 40 MG PO TABS: 40.0000 mg | ORAL_TABLET | Freq: Every day | ORAL | Status: DC

## 2014-12-10 NOTE — Assessment & Plan Note (Signed)
Overall, she is improving. Plan to order a PET CT scan in 6 weeks and see her back for further review.

## 2014-12-10 NOTE — Assessment & Plan Note (Signed)
Her blood pressure is poorly controlled. I recommend we keep hydrochlorothiazide and increase lisinopril to 40 mg daily. I gave her a new prescription.

## 2014-12-10 NOTE — Assessment & Plan Note (Signed)
She have chronic neck pain but declined morphine sulfate. I declined prescribing Percocet. She is only taking Tylenol as needed

## 2014-12-10 NOTE — Assessment & Plan Note (Signed)
She has skin ulcer at the front and significant radiation injury to the skin on the back. Overall, I see healthy granulation tissue and there is no signs of infection. She will continue conservative management with wound dressing for now. She does not need antibiotic therapy

## 2014-12-10 NOTE — Progress Notes (Signed)
[place] OFFICE PROGRESS NOTE  Patient Care Team: Rosita Fire, MD as PCP - General (Internal Medicine) Heath Lark, MD as Consulting Physician (Hematology and Oncology) Jodi Marble, MD as Consulting Physician (Otolaryngology) Eppie Gibson, MD as Attending Physician (Radiation Oncology)  SUMMARY OF ONCOLOGIC HISTORY: Oncology History   Bilateral lung cancer   Staging form: Lung, AJCC 6th Edition     Clinical stage from 10/01/2014: Stage IIIB (T4(m), N3, M0) - Signed by Heath Lark, MD on 10/01/2014 Cancer of upper lobe of right lung   Staging form: Lung, AJCC 7th Edition     Clinical: Stage Unknown (TX, N3, M0) - Signed by Eppie Gibson, MD on 10/07/2014       Bilateral lung cancer ([place])   09/05/2014 - 09/12/2014 Hospital Admission The patient was admitted to the hospital for further management of possible skin reaction, dysphagia and supraclavicular lymphadenopathy   09/06/2014 Imaging CT scan of the neck showed malignant appearing superior mediastinal and right thoracic inlet lymphadenopathy   09/10/2014 Imaging CT scan of the abdomen and pelvis confirmed right supraclavicular and mediastinal lymphadenopathy, as described above but no other evidence of metastatic cancer    09/14/2014 Procedure Flexible endoscopy in the ENT office failed to reveal any primary in the head and neck region   09/16/2014 Imaging She failed barium swallow. 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    INTERVAL HISTORY: Please see below for problem oriented charting.  she returns for further follow-up. She continues to have pain but denies taking pain medicine. She is compliant taking her blood pressure medications. She denies dysphagia. Her skin is healing slowly.  She denies recent infection.  REVIEW OF SYSTEMS:   Constitutional: Denies fevers, chills or abnormal weight loss Eyes: Denies blurriness of vision Ears, nose, mouth, throat, and face: Denies mucositis or sore throat Respiratory: Denies cough, dyspnea or wheezes Cardiovascular: Denies palpitation, chest discomfort or lower extremity swelling Gastrointestinal:  Denies nausea, heartburn or change in bowel habits Lymphatics: Denies new lymphadenopathy or easy bruising Neurological:Denies numbness, tingling or new weaknesses Behavioral/Psych: Mood is stable, no new changes  All other systems were reviewed with the patient and are negative.  I have reviewed the past medical history, past surgical history, social history and family history with the patient and they are unchanged from previous note.  ALLERGIES:  is allergic to hydrocodone-acetaminophen.  MEDICATIONS:  Current Outpatient Prescriptions  Medication Sig Dispense Refill  . amLODipine (NORVASC) 10 MG tablet Take 1 tablet (10 mg total) by mouth daily. 30 tablet 6  . emollient (BIAFINE) cream Apply topically as needed (apply as directed to Radiation field).    . fluocinonide cream (LIDEX) 2.44 % Apply 1 application topically 2 (two) times daily.    . hydrochlorothiazide (HYDRODIURIL) 25 MG tablet Take 1 tablet (25 mg total) by mouth daily. [place]  tablet 6  . lidocaine (XYLOCAINE) 2 % solution Patient: Mix 1part 2% viscous lidocaine, 1part  H20. Swallow 49m of this mixture, 384m before meals and at bedtime, up to QID 100 mL 5  . lidocaine-prilocaine (EMLA) cream Apply to the affected area as directed.  3  . lisinopril (PRINIVIL,ZESTRIL) 40 MG tablet Take 1 tablet (40 mg total) by mouth daily. 30 tablet 6  . oxyCODONE-acetaminophen (PERCOCET) 5-325 MG tablet Take 1 tablet by mouth every 4 (four) hours as needed for severe pain. 20 tablet 0  . [facility]    . silver sulfADIAZINE (SILVADENE) 1 % cream Apply 1 application topically 2 (two) times daily.    . Marland Kitchenriamcinolone cream (KENALOG) 0.1 %   0  . diphenhydrAMINE (BENADRYL) 25 MG tablet Take 25 mg by mouth every 6 (six) hours as needed for itching or sleep.     . Marland KitchenPINEPHrine 0.3 mg/0.3 mL IJ SOAJ injection Inject 0.3 mLs (0.3 mg total) into the muscle once. (Patient not taking: Reported on 12/10/2014) 1 Device 0  . morphine (MSIR) 15 MG tablet Take 1 tablet (15 mg total) by mouth every 6 (six) hours as needed. (Patient not taking: Reported on 11/25/2014) 60 tablet 0  . ondansetron (ZOFRAN) 8 MG tablet Take 1 tablet (8 mg total) by mouth every 8 (eight) hours as needed. (Patient not taking: Reported on 12/10/2014) 60 tablet 1  . polyethylene glycol (MIRALAX) packet Take 17 g by mouth daily. (Patient not taking: Reported on 12/10/2014) 14 each 0  . prochlorperazine (COMPAZINE) 10 MG tablet Take 1 tablet (10 mg total) by mouth every 6 (six) hours as needed (Nausea or vomiting). (Patient not taking: Reported on 12/10/2014) 60 tablet 1  . sucralfate (CARAFATE) 1 G tablet Dissolve 1 tablet in 10 mL H20 and swallow up to QID for sore throat (Patient not taking: Reported on 12/10/2014) 60 tablet 5   No current facility-administered medications for this visit.    PHYSICAL EXAMINATION: ECOG PERFORMANCE STATUS: 1 - Symptomatic but completely ambulatory  Filed Vitals:   12/10/14 1017  BP: 150/80  Pulse: 98  Temp: 98.1 F (36.7 C)  Resp: 18   Filed  Weights   12/10/14 1017  Weight: 119 lb 1.6 oz (54.023 kg)    GENERAL:alert, no distress and comfortable SKIN:  Note a skin ulceration at least 3 inches wide but with healthy granulation tissue. No surrounding cellulitis. She has peeling of the skin on the right side of her chest on her back but no ulceration is noted EYES: normal, Conjunctiva are pink and non-injected, sclera clear OROPHARYNX:no exudate, no erythema and lips, buccal mucosa, and tongue normal  NECK: supple, thyroid normal size, non-tender, without nodularity LYMPH:  no palpable lymphadenopathy in the cervical, axillary or inguinal LUNGS: clear to auscultation and percussion with normal breathing effort HEART: regular rate & rhythm and no murmurs and no lower extremity edema ABDOMEN:abdomen soft, non-tender and normal bowel sounds Musculoskeletal:no cyanosis of digits and no clubbing  NEURO: alert & oriented x 3 with fluent speech, no focal motor/sensory deficits  LABORATORY DATA:  I have reviewed the data as listed    Component Value Date/Time   NA 142 12/10/2014 1000   NA 139 11/20/2014 2254   K 3.4* 12/10/2014 1000   K 3.4* 11/20/2014 2254   CL 102 11/20/2014 2254   CO2 29 12/10/2014 1000   CO2 29 11/20/2014 2254   GLUCOSE 83 12/10/2014 1000   GLUCOSE 100* 11/20/2014 2254  BUN 10.0 12/10/2014 1000   BUN 20 11/20/2014 2254   CREATININE 0.9 12/10/2014 1000   CREATININE 1.01* 11/20/2014 2254   CALCIUM 9.6 12/10/2014 1000   CALCIUM 9.0 11/20/2014 2254   PROT 7.2 12/10/2014 1000   PROT 6.6 10/25/2014 0021   ALBUMIN 3.6 12/10/2014 1000   ALBUMIN 3.4* 10/25/2014 0021   AST 17 12/10/2014 1000   AST 20 10/25/2014 0021   ALT <9 12/10/2014 1000   ALT 11* 10/25/2014 0021   ALKPHOS 136 12/10/2014 1000   ALKPHOS 123 10/25/2014 0021   BILITOT <0.30 12/10/2014 1000   BILITOT 0.5 10/25/2014 0021   GFRNONAA 60* 11/20/2014 2254   GFRAA >60 11/20/2014 2254    No results found for: SPEP, UPEP  Lab Results   Component Value Date   WBC 4.4 12/10/2014   NEUTROABS 2.7 12/10/2014   HGB 11.7 12/10/2014   HCT 36.1 12/10/2014   MCV 96.5 12/10/2014   PLT 186 12/10/2014      Chemistry      Component Value Date/Time   NA 142 12/10/2014 1000   NA 139 11/20/2014 2254   K 3.4* 12/10/2014 1000   K 3.4* 11/20/2014 2254   CL 102 11/20/2014 2254   CO2 29 12/10/2014 1000   CO2 29 11/20/2014 2254   BUN 10.0 12/10/2014 1000   BUN 20 11/20/2014 2254   CREATININE 0.9 12/10/2014 1000   CREATININE 1.01* 11/20/2014 2254      Component Value Date/Time   CALCIUM 9.6 12/10/2014 1000   CALCIUM 9.0 11/20/2014 2254   ALKPHOS 136 12/10/2014 1000   ALKPHOS 123 10/25/2014 0021   AST 17 12/10/2014 1000   AST 20 10/25/2014 0021   ALT <9 12/10/2014 1000   ALT 11* 10/25/2014 0021   BILITOT <0.30 12/10/2014 1000   BILITOT 0.5 10/25/2014 0021     ASSESSMENT & PLAN:  Bilateral lung cancer ([place])  Overall, she is improving. Plan to order a PET CT scan in 6 weeks and see her back for further review.  Chronic neck pain  She have chronic neck pain but declined morphine sulfate. I declined prescribing Percocet. She is only taking Tylenol as needed  Essential hypertension  Her blood pressure is poorly controlled. I recommend we keep hydrochlorothiazide and increase lisinopril to 40 mg daily. I gave her a new prescription.  Skin ulcer ([place])  She has skin ulcer at the front and significant radiation injury to the skin on the back. Overall, I see healthy granulation tissue and there is no signs of infection. She will continue conservative management with wound dressing for now. She does not need antibiotic therapy   Orders Placed This Encounter  Procedures  . NM PET Image Restag (PS) Skull Base To Thigh    Standing Status: Future     Number of Occurrences:      Standing Expiration Date: 02/09/2016    Order Specific Question:  Reason for Exam (SYMPTOM  OR DIAGNOSIS REQUIRED)    Answer:  staging lung ca,  assess response to Rx    Order Specific Question:  Is the patient pregnant?    Answer:  No    Order Specific Question:  Preferred imaging location?    Answer:  [facility]   All questions were answered. The patient knows to call the clinic with any problems, questions or concerns. No barriers to learning was detected. I spent 25 minutes counseling the patient face to face. The total time spent in the appointment was 30 minutes  and more than 50% was on counseling and review of test results     [facility], Countess Biebel, MD 12/10/2014 11:18 AM

## 2014-12-10 NOTE — Telephone Encounter (Signed)
Gave and printed appts ched and avs for pt for DEC  °

## 2014-12-11 ENCOUNTER — Other Ambulatory Visit: Payer: Self-pay | Admitting: Hematology and Oncology

## 2014-12-11 NOTE — Progress Notes (Deleted)
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 60 Gy in 30 fractions.  Beams/energy: 3D-Conformal Other // 6X  Narrative: The patient tolerated radiation treatment relatively well.  Plan: The patient has completed radiation treatment. The patient will return to radiation oncology clinic for routine followup in one month. I advised them to call or return sooner if they have any questions or concerns related to their recovery or treatment.  -----------------------------------  Megan Sims,   This document serves as a record of services personally performed by Megan Gibson, MD. It was created on her behalf by Darcus Austin, a trained medical scribe. The creation of this record is based on the scribe's personal observations and the provider's statements to them. This document has been checked and approved by the attending provider.

## 2014-12-16 NOTE — Progress Notes (Signed)
  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 60 Gy in 30 fractions.  Beams/energy: 3D-Conformal Other // 6X  Narrative: The patient tolerated radiation treatment relatively well.  Plan: The patient has completed radiation treatment. The patient will return to radiation oncology clinic for routine followup in one month. I advised them to call or return sooner if they have any questions or concerns related to their recovery or treatment.  -----------------------------------  Eppie Gibson,   This document serves as a record of services personally performed by Eppie Gibson, MD. It was created on her behalf by Darcus Austin, a trained medical scribe. The creation of this record is based on the scribe's personal observations and the provider's statements to them. This document has been checked and approved by the attending provider.

## 2014-12-21 ENCOUNTER — Ambulatory Visit: Payer: Medicaid Other

## 2014-12-21 ENCOUNTER — Ambulatory Visit: Payer: Medicaid Other | Attending: Hematology and Oncology

## 2014-12-29 ENCOUNTER — Ambulatory Visit (HOSPITAL_COMMUNITY): Payer: Medicaid Other

## 2014-12-30 ENCOUNTER — Encounter: Payer: Self-pay | Admitting: Hematology and Oncology

## 2014-12-30 ENCOUNTER — Ambulatory Visit
Admission: RE | Admit: 2014-12-30 | Discharge: 2014-12-30 | Disposition: A | Discharge status: Home / Self Care | Payer: Medicaid Other | Source: Ambulatory Visit | Attending: Radiation Oncology | Admitting: Radiation Oncology

## 2014-12-30 ENCOUNTER — Telehealth: Payer: Self-pay | Admitting: Hematology and Oncology

## 2014-12-30 ENCOUNTER — Encounter: Payer: Self-pay | Admitting: Radiation Oncology

## 2014-12-30 ENCOUNTER — Ambulatory Visit (HOSPITAL_BASED_OUTPATIENT_CLINIC_OR_DEPARTMENT_OTHER): Payer: Medicaid Other | Admitting: Hematology and Oncology

## 2014-12-30 VITALS — BP 160/82 | HR 100 | Temp 97.9°F | Resp 18 | Ht 64.0 in | Wt 119.5 lb

## 2014-12-30 VITALS — BP 158/88 | HR 94 | Temp 98.0°F | Ht 64.0 in | Wt 119.9 lb

## 2014-12-30 DIAGNOSIS — C3491 Malignant neoplasm of unspecified part of right bronchus or lung: Secondary | ICD-10-CM | POA: Diagnosis not present

## 2014-12-30 DIAGNOSIS — I10 Essential (primary) hypertension: Secondary | ICD-10-CM | POA: Diagnosis not present

## 2014-12-30 DIAGNOSIS — L98491 Non-pressure chronic ulcer of skin of other sites limited to breakdown of skin: Secondary | ICD-10-CM

## 2014-12-30 DIAGNOSIS — C3492 Malignant neoplasm of unspecified part of left bronchus or lung: Secondary | ICD-10-CM | POA: Diagnosis not present

## 2014-12-30 DIAGNOSIS — C3411 Malignant neoplasm of upper lobe, right bronchus or lung: Secondary | ICD-10-CM

## 2014-12-30 MED ORDER — ATENOLOL 25 MG PO TABS: 25.0000 mg | ORAL_TABLET | Freq: Every day | ORAL | Status: DC

## 2014-12-30 NOTE — Progress Notes (Signed)
[place] OFFICE PROGRESS NOTE  Patient Care Team: Rosita Fire, MD as PCP - General (Internal Medicine) Heath Lark, MD as Consulting Physician (Hematology and Oncology) Jodi Marble, MD as Consulting Physician (Otolaryngology) Eppie Gibson, MD as Attending Physician (Radiation Oncology)  SUMMARY OF ONCOLOGIC HISTORY: Oncology History   Bilateral lung cancer   Staging form: Lung, AJCC 6th Edition     Clinical stage from 10/01/2014: Stage IIIB (T4(m), N3, M0) - Signed by Heath Lark, MD on 10/01/2014 Cancer of upper lobe of right lung   Staging form: Lung, AJCC 7th Edition     Clinical: Stage Unknown (TX, N3, M0) - Signed by Eppie Gibson, MD on 10/07/2014       Bilateral lung cancer ([place])   09/05/2014 - 09/12/2014 Hospital Admission The patient was admitted to the hospital for further management of possible skin reaction, dysphagia and supraclavicular lymphadenopathy   09/06/2014 Imaging CT scan of the neck showed malignant appearing superior mediastinal and right thoracic inlet lymphadenopathy   09/10/2014 Imaging CT scan of the abdomen and pelvis confirmed right supraclavicular and mediastinal lymphadenopathy, as described above but no other evidence of metastatic cancer    09/14/2014 Procedure Flexible endoscopy in the ENT office failed to reveal any primary in the head and neck region   09/16/2014 Imaging She failed barium swallow. 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    INTERVAL HISTORY: Please see below for problem oriented charting. She returns for further follow-up. The patient was very disappointed that her PET CT scan was cancelled yesterday. I am not made aware of this. In the meantime she is doing very well. She is no longer taking pain medications for pain. The skin ulceration on the right side of her neck is improving with conservative management. She denies recent infection. She is compliant taking all her blood pressure medications.  REVIEW OF SYSTEMS:   Constitutional: Denies fevers, chills or abnormal weight loss Eyes: Denies blurriness of vision Ears, nose, mouth, throat, and face: Denies mucositis or sore throat Respiratory: Denies cough, dyspnea or wheezes Cardiovascular: Denies palpitation, chest discomfort or lower extremity swelling Gastrointestinal:  Denies nausea, heartburn or change in bowel habits Lymphatics: Denies new lymphadenopathy or easy bruising Neurological:Denies numbness, tingling or new weaknesses Behavioral/Psych: Mood is stable, no new changes  All other systems were reviewed with the patient and are negative.  I have reviewed the past medical history, past surgical history, social history and family history with the patient and they are unchanged from previous note.  ALLERGIES:  is allergic to hydrocodone-acetaminophen.  MEDICATIONS:  Current Outpatient Prescriptions  Medication Sig Dispense Refill  . amLODipine (NORVASC) 10 MG tablet Take 1 tablet (10 mg total) by mouth daily. 30 tablet 6  . atenolol (TENORMIN) 25 MG tablet Take 1 tablet (25 mg total) by mouth daily. 90 tablet  3  . diphenhydrAMINE (BENADRYL) 25 MG tablet Take 25 mg by mouth every 6 (six) hours as needed for  itching or sleep.     Marland Kitchen emollient (BIAFINE) cream Apply topically as needed (apply as directed to Radiation field).    . EPINEPHrine 0.3 mg/0.3 mL IJ SOAJ injection Inject 0.3 mLs (0.3 mg total) into the muscle once. (Patient not taking: Reported on 12/10/2014) 1 Device 0  . fluocinonide cream (LIDEX) 7.89 % Apply 1 application topically 2 (two) times daily.    . hydrochlorothiazide (HYDRODIURIL) 25 MG tablet Take 1 tablet (25 mg total) by mouth daily. 30 tablet 6  . lidocaine (XYLOCAINE) 2 % solution Patient: Mix 1part 2% viscous lidocaine, 1part H20. Swallow 43m of this mixture, 351m before meals and at bedtime, up to QID (Patient not taking: Reported on 12/30/2014) 100 mL 5  . lidocaine-prilocaine (EMLA) cream Apply to the affected area as directed.  3  . lisinopril (PRINIVIL,ZESTRIL) 40 MG tablet Take 1 tablet (40 mg total) by mouth daily. 30 tablet 6  . morphine (MSIR) 15 MG tablet Take 1 tablet (15 mg total) by mouth every 6 (six) hours as needed. (Patient not taking: Reported on 11/25/2014) 60 tablet 0  . oxyCODONE-acetaminophen (PERCOCET) 5-325 MG tablet Take 1 tablet by mouth every 4 (four) hours as needed for severe pain. (Patient not taking: Reported on 12/30/2014) 20 tablet 0  . polyethylene glycol (MIRALAX) packet Take 17 g by mouth daily. (Patient not taking: Reported on 12/10/2014) 14 each 0  . [facility]    . silver sulfADIAZINE (SILVADENE) 1 % cream Apply 1 application topically 2 (two) times daily.    . sucralfate (CARAFATE) 1 G tablet Dissolve 1 tablet in 10 mL H20 and swallow up to QID for sore throat (Patient not taking: Reported on 12/10/2014) 60 tablet 5  . triamcinolone cream (KENALOG) 0.1 %   0   No current facility-administered medications for this visit.    PHYSICAL EXAMINATION: ECOG PERFORMANCE STATUS: 0 - Asymptomatic  Filed Vitals:   12/30/14 1016  BP: 160/82  Pulse: 100  Temp: 97.9 F (36.6 C)  Resp: 18   Filed Weights    12/30/14 1016  Weight: 119 lb 8 oz (54.205 kg)    GENERAL:alert, no distress and comfortable SKIN: the skin rash on her back has healed. At the front, there is still a small ulcer, measuring approximately 1 inch in diameter but much improved compared to previous visit EYES: normal, Conjunctiva are pink and non-injected, sclera clear OROPHARYNX:no exudate, no erythema and lips, buccal mucosa, and tongue normal  NECK: supple, thyroid normal size, non-tender, without nodularity LYMPH:  no palpable lymphadenopathy in the cervical, axillary or inguinal LUNGS: clear to auscultation and percussion with normal breathing effort HEART: regular rate & rhythm and no murmurs and no lower extremity edema ABDOMEN:abdomen soft, non-tender and normal bowel sounds Musculoskeletal:no cyanosis of digits and no clubbing  NEURO: alert & oriented x 3 with fluent speech, no focal motor/sensory deficits  LABORATORY DATA:  I have reviewed the data as listed    Component Value Date/Time   NA 142 12/10/2014 1000   NA 139 11/20/2014 2254   K 3.4* 12/10/2014 1000   K 3.4* 11/20/2014 2254   CL 102 11/20/2014 2254   CO2 29 12/10/2014 1000   CO2 29 11/20/2014 2254   GLUCOSE 83 12/10/2014 1000   GLUCOSE 100* 11/20/2014 2254   BUN 10.0 12/10/2014 1000   BUN 20 11/20/2014  2254   CREATININE 0.9 12/10/2014 1000   CREATININE 1.01* 11/20/2014 2254   CALCIUM 9.6 12/10/2014 1000   CALCIUM 9.0 11/20/2014 2254   PROT 7.2 12/10/2014 1000   PROT 6.6 10/25/2014 0021   ALBUMIN 3.6 12/10/2014 1000   ALBUMIN 3.4* 10/25/2014 0021   AST 17 12/10/2014 1000   AST 20 10/25/2014 0021   ALT <9 12/10/2014 1000   ALT 11* 10/25/2014 0021   ALKPHOS 136 12/10/2014 1000   ALKPHOS 123 10/25/2014 0021   BILITOT <0.30 12/10/2014 1000   BILITOT 0.5 10/25/2014 0021   GFRNONAA 60* 11/20/2014 2254   GFRAA >60 11/20/2014 2254    No results found for: SPEP, UPEP  Lab Results  Component Value Date   WBC 4.4 12/10/2014    NEUTROABS 2.7 12/10/2014   HGB 11.7 12/10/2014   HCT 36.1 12/10/2014   MCV 96.5 12/10/2014   PLT 186 12/10/2014      Chemistry      Component Value Date/Time   NA 142 12/10/2014 1000   NA 139 11/20/2014 2254   K 3.4* 12/10/2014 1000   K 3.4* 11/20/2014 2254   CL 102 11/20/2014 2254   CO2 29 12/10/2014 1000   CO2 29 11/20/2014 2254   BUN 10.0 12/10/2014 1000   BUN 20 11/20/2014 2254   CREATININE 0.9 12/10/2014 1000   CREATININE 1.01* 11/20/2014 2254      Component Value Date/Time   CALCIUM 9.6 12/10/2014 1000   CALCIUM 9.0 11/20/2014 2254   ALKPHOS 136 12/10/2014 1000   ALKPHOS 123 10/25/2014 0021   AST 17 12/10/2014 1000   AST 20 10/25/2014 0021   ALT <9 12/10/2014 1000   ALT 11* 10/25/2014 0021   BILITOT <0.30 12/10/2014 1000   BILITOT 0.5 10/25/2014 0021      ASSESSMENT & PLAN:  Bilateral lung cancer ([place]) I was disappointed to find out that the PET CT scan was canceled. I was not informed. I will work with my scheduler to find why it was canceled. I will schedule it to be done next month when I see her back. She is recovering well from recent side effects of treatment.  Essential hypertension Her blood pressure is still high. I recommend adding beta blocker and will start her on atenolol in addition to her current blood pressure regimen. I recommend she follows closely with primary care doctor for chronic blood pressure management  Skin ulcer ([place]) This is related to side effects of treatment. It is healing well. Only a very shallow ulcer is noted at the front of her chest wall. The ulceration of the skin at the back has completely healed. I recommend continue conservative management and I will reassess her wound again next month.   No orders of the defined types were placed in this encounter.   All questions were answered. The patient knows to call the clinic with any problems, questions or concerns. No barriers to learning was detected. I spent 15  minutes counseling the patient face to face. The total time spent in the appointment was 20 minutes and more than 50% was on counseling and review of test results     [facility], Verenice Westrich, MD 12/30/2014 1:16 PM

## 2014-12-30 NOTE — Assessment & Plan Note (Signed)
I was disappointed to find out that the PET CT scan was canceled. I was not informed. I will work with my scheduler to find why it was canceled. I will schedule it to be done next month when I see her back. She is recovering well from recent side effects of treatment.

## 2014-12-30 NOTE — Assessment & Plan Note (Signed)
Her blood pressure is still high. I recommend adding beta blocker and will start her on atenolol in addition to her current blood pressure regimen. I recommend she follows closely with primary care doctor for chronic blood pressure management

## 2014-12-30 NOTE — Assessment & Plan Note (Signed)
This is related to side effects of treatment. It is healing well. Only a very shallow ulcer is noted at the front of her chest wall. The ulceration of the skin at the back has completely healed. I recommend continue conservative management and I will reassess her wound again next month.

## 2014-12-30 NOTE — Telephone Encounter (Signed)
Gave and printed appt sched and avs fo rpt for Jan °

## 2014-12-30 NOTE — Progress Notes (Signed)
Radiation Oncology         [phone] ________________________________  Name: Megan Sims MRN: [id number]  Date: 12/30/2014  DOB: 01-15-1956  Follow-Up Visit Note  Outpatient  CC: Megan Fire, MD  Megan Lark, MD  Diagnosis and Prior Radiotherapy:    ICD-9-CM ICD-10-CM   1. 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 60 Gy in 30 fractions.   Narrative:  The patient returns today for routine follow-up.  Feels great.  She reports she is eating well. She also denies any pain at this time. She denies shortness of breath even with exertion. The skin is dry to her upper back and mid chest area. She is using aloe-vera cream which she started last week twice daily in the morning and at night. Her biospy site is still open and she keeps it open to air when she is at home, but covers it with a bandaid when she is out. Applies silvadene daily to this area, and it is improving  BP 158/88 mmHg  Pulse 94  Temp(Src) 98 F (36.7 C)  Ht '5\' 4"'$  (1.626 m)  Wt 119 lb 14.4 oz (54.386 kg)  BMI 20.57 kg/m2  SpO2 100%   Wt Readings from Last 3 Encounters:  12/30/14 119 lb 14.4 oz (54.386 kg)  12/30/14 119 lb 8 oz (54.205 kg)  12/10/14 119 lb 1.6 oz (54.023 kg)                                  ALLERGIES:  is allergic to hydrocodone-acetaminophen.  Meds: Current Outpatient Prescriptions  Medication Sig Dispense Refill  . amLODipine (NORVASC) 10 MG tablet Take 1 tablet (10 mg total) by mouth daily. 30 tablet 6  . atenolol (TENORMIN) 25 MG tablet Take 1 tablet (25 mg total) by mouth daily. 90 tablet 3  . hydrochlorothiazide (HYDRODIURIL) 25 MG tablet Take 1 tablet (25 mg  total) by mouth daily. 30 tablet 6  . lidocaine-prilocaine (EMLA) cream Apply to the affected area as directed.  3  . lisinopril (PRINIVIL,ZESTRIL) 40 MG tablet Take 1 tablet (40 mg total) by mouth daily. 30 tablet 6  . silver sulfADIAZINE (SILVADENE) 1 % cream Apply 1 application topically 2 (two) times daily.    . diphenhydrAMINE (BENADRYL) 25 MG tablet Take 25 mg by mouth every 6 (six) hours as needed for itching or sleep.     Marland Kitchen emollient (BIAFINE) cream Apply topically as needed (apply as directed to Radiation field).    . EPINEPHrine 0.3 mg/0.3 mL IJ SOAJ injection Inject 0.3 mLs (0.3 mg total) into the muscle once. (Patient not taking: Reported on 12/10/2014) 1 Device 0  . fluocinonide cream (LIDEX) 1.66 % Apply 1 application topically 2 (two) times daily.    Marland Kitchen lidocaine (XYLOCAINE) 2 % solution Patient: Mix 1part 2% viscous lidocaine, 1part H20. Swallow 46m of this mixture, 377m before meals and at bedtime, up to QID (Patient not taking: Reported on 12/30/2014) 100 mL 5  . morphine (MSIR) 15 MG tablet Take 1 tablet (15 mg total) by mouth every 6 (six) hours as needed. (Patient not taking: Reported on 11/25/2014) 60  tablet 0  . oxyCODONE-acetaminophen (PERCOCET) 5-325 MG tablet Take 1 tablet by mouth every 4 (four) hours as needed for severe pain. (Patient not taking: Reported on 12/30/2014) 20 tablet 0  . polyethylene glycol (MIRALAX) packet Take 17 g by mouth daily. (Patient not taking: Reported on 12/10/2014) 14 each 0  . PRESCRIPTION MEDICATION Chemo at [facility] 11/18/14.    . sucralfate (CARAFATE) 1 G tablet Dissolve 1 tablet in 10 mL H20 and swallow up to QID for sore throat (Patient not taking: Reported on 12/10/2014) 60 tablet 5  . triamcinolone cream (KENALOG) 0.1 %   0   No current facility-administered medications for this encounter.    Physical Findings: The patient is in no acute distress. Patient is alert and oriented.  height is '5\' 4"'$  (1.626 m) and weight is 119 lb 14.4 oz  (54.386 kg). Her temperature is 98 F (36.7 C). Her blood pressure is 158/88 and her pulse is 94. Her oxygen saturation is 100%. Marland Kitchen   1-2cm area of Right lower neck skin desquamation, moist, but no sign of infection. Appears to be healing. Lungs CTAB, heart RRR.   Lab Findings: Lab Results  Component Value Date   WBC 4.4 12/10/2014   HGB 11.7 12/10/2014   HCT 36.1 12/10/2014   MCV 96.5 12/10/2014   PLT 186 12/10/2014    Radiographic Findings: No results found.  Impression/Plan: doing well, healing well post treatment. Continue current skin care.  Restaging scans per Dr Alvy Bimler.  I gave the patient a card to see me back in 6 mo, sooner if needed.  _____________________________________   Eppie Gibson, MD

## 2014-12-30 NOTE — Progress Notes (Signed)
Ms. Nishida presents for follow up of radiation completed 11/25/14 to her Chest/Right supraclavicular area. She reports she is eating well. She also denies any pain at this time. She denies shortness of breath even with exertion. The skin is dry to her upper back and mid chest area. She is using aloe-vera cream which she started last week twice daily in the morning and at night. Her biospy site is still open with some red/yellow tissue visible. She reports it drains a scant amount of yellow/greenish drainage. She keeps it open to air when she is at home, but covers it with a bandaid when she is out.   BP 158/88 mmHg  Pulse 94  Temp(Src) 98 F (36.7 C)  Ht '5\' 4"'$  (1.626 m)  Wt 119 lb 14.4 oz (54.386 kg)  BMI 20.57 kg/m2  SpO2 100%   Wt Readings from Last 3 Encounters:  12/30/14 119 lb 14.4 oz (54.386 kg)  12/30/14 119 lb 8 oz (54.205 kg)  12/10/14 119 lb 1.6 oz (54.023 kg)

## 2014-12-30 NOTE — Telephone Encounter (Signed)
per pof to sch pt appt-gave pt copy of avs °

## 2015-01-13 ENCOUNTER — Other Ambulatory Visit: Payer: Self-pay | Admitting: Hematology and Oncology

## 2015-01-19 ENCOUNTER — Other Ambulatory Visit: Payer: Self-pay | Admitting: Hematology and Oncology

## 2015-01-19 DIAGNOSIS — C3492 Malignant neoplasm of unspecified part of left bronchus or lung: Principal | ICD-10-CM

## 2015-01-19 DIAGNOSIS — C3491 Malignant neoplasm of unspecified part of right bronchus or lung: Secondary | ICD-10-CM

## 2015-01-20 ENCOUNTER — Telehealth: Payer: Self-pay | Admitting: Hematology and Oncology

## 2015-01-20 NOTE — Telephone Encounter (Signed)
12/27 pof noted and patient is already on 01/29/15 for a lab and follow up and she will get a call with her scan appointment

## 2015-01-21 ENCOUNTER — Telehealth: Payer: Self-pay | Admitting: *Deleted

## 2015-01-21 NOTE — Telephone Encounter (Signed)
-----   Message from Heath Lark, MD sent at 01/19/2015 12:05 PM EST ----- Regarding: Imaging scan I have spoken with insurance. It is denied I cancelled the PET and ordered CT chest, abdomen and pelvis Authorization for CT: [id number] Please call radiology to schedule. I placed POF to move her labs to be done on 1/5. She needs to be contacted and will need to drink contrast for CT Pls also tell her to hold HCTZ and lisinopril the day before, the day of and the day after CT scan

## 2015-01-21 NOTE — Telephone Encounter (Signed)
Spoke with patient regarding CT and holding meds as indicated below. Verbalized understanding

## 2015-01-28 ENCOUNTER — Other Ambulatory Visit: Payer: Self-pay | Admitting: Hematology and Oncology

## 2015-01-28 ENCOUNTER — Ambulatory Visit (HOSPITAL_COMMUNITY)
Admission: RE | Admit: 2015-01-28 | Discharge: 2015-01-28 | Disposition: A | Discharge status: Home / Self Care | Payer: Medicaid Other | Source: Ambulatory Visit | Attending: Hematology and Oncology | Admitting: Hematology and Oncology

## 2015-01-28 ENCOUNTER — Encounter (HOSPITAL_COMMUNITY): Payer: Self-pay

## 2015-01-28 ENCOUNTER — Other Ambulatory Visit (HOSPITAL_COMMUNITY): Payer: Medicaid Other

## 2015-01-28 DIAGNOSIS — C3491 Malignant neoplasm of unspecified part of right bronchus or lung: Secondary | ICD-10-CM

## 2015-01-28 DIAGNOSIS — I2510 Atherosclerotic heart disease of native coronary artery without angina pectoris: Secondary | ICD-10-CM | POA: Diagnosis not present

## 2015-01-28 DIAGNOSIS — Z9221 Personal history of antineoplastic chemotherapy: Secondary | ICD-10-CM | POA: Insufficient documentation

## 2015-01-28 DIAGNOSIS — Z87891 Personal history of nicotine dependence: Secondary | ICD-10-CM | POA: Diagnosis not present

## 2015-01-28 DIAGNOSIS — C3492 Malignant neoplasm of unspecified part of left bronchus or lung: Principal | ICD-10-CM

## 2015-01-28 DIAGNOSIS — Z923 Personal history of irradiation: Secondary | ICD-10-CM | POA: Diagnosis not present

## 2015-01-28 DIAGNOSIS — R911 Solitary pulmonary nodule: Secondary | ICD-10-CM | POA: Diagnosis not present

## 2015-01-28 DIAGNOSIS — M889 Osteitis deformans of unspecified bone: Secondary | ICD-10-CM | POA: Insufficient documentation

## 2015-01-28 MED ORDER — IOHEXOL 300 MG/ML  SOLN
100.0000 mL | Freq: Once | INTRAMUSCULAR | Status: AC | PRN
  Administered 2015-01-28: 100 mL via INTRAVENOUS

## 2015-01-29 ENCOUNTER — Telehealth: Payer: Self-pay | Admitting: Hematology and Oncology

## 2015-01-29 ENCOUNTER — Other Ambulatory Visit (HOSPITAL_BASED_OUTPATIENT_CLINIC_OR_DEPARTMENT_OTHER): Payer: Medicaid Other

## 2015-01-29 ENCOUNTER — Ambulatory Visit (HOSPITAL_BASED_OUTPATIENT_CLINIC_OR_DEPARTMENT_OTHER): Payer: Medicaid Other | Admitting: Hematology and Oncology

## 2015-01-29 VITALS — BP 177/82 | HR 96 | Temp 98.0°F | Resp 19 | Wt 124.3 lb

## 2015-01-29 DIAGNOSIS — C3492 Malignant neoplasm of unspecified part of left bronchus or lung: Secondary | ICD-10-CM

## 2015-01-29 DIAGNOSIS — L98499 Non-pressure chronic ulcer of skin of other sites with unspecified severity: Secondary | ICD-10-CM

## 2015-01-29 DIAGNOSIS — I10 Essential (primary) hypertension: Secondary | ICD-10-CM | POA: Diagnosis not present

## 2015-01-29 DIAGNOSIS — C3491 Malignant neoplasm of unspecified part of right bronchus or lung: Secondary | ICD-10-CM | POA: Diagnosis present

## 2015-01-29 LAB — CBC WITH DIFFERENTIAL/PLATELET
BASO%: 0.2 % (ref 0.0–2.0)
Basophils Absolute: 0 10*3/uL (ref 0.0–0.1)
EOS%: 1.7 % (ref 0.0–7.0)
Eosinophils Absolute: 0.1 10*3/uL (ref 0.0–0.5)
HEMATOCRIT: 37.9 % (ref 34.8–46.6)
HEMOGLOBIN: 12.5 g/dL (ref 11.6–15.9)
LYMPH#: 1.2 10*3/uL (ref 0.9–3.3)
LYMPH%: 14.2 % (ref 14.0–49.7)
MCH: 32 pg (ref 25.1–34.0)
MCHC: 33 g/dL (ref 31.5–36.0)
MCV: 96.9 fL (ref 79.5–101.0)
MONO#: 0.8 10*3/uL (ref 0.1–0.9)
MONO%: 10.1 % (ref 0.0–14.0)
NEUT%: 73.8 % (ref 38.4–76.8)
NEUTROS ABS: 6 10*3/uL (ref 1.5–6.5)
Platelets: 243 10*3/uL (ref 145–400)
RBC: 3.91 10*6/uL (ref 3.70–5.45)
RDW: 17.8 % — AB (ref 11.2–14.5)
WBC: 8.1 10*3/uL (ref 3.9–10.3)

## 2015-01-29 LAB — COMPREHENSIVE METABOLIC PANEL
AST: 13 U/L (ref 5–34)
Albumin: 3.7 g/dL (ref 3.5–5.0)
Alkaline Phosphatase: 199 U/L — ABNORMAL HIGH (ref 40–150)
Anion Gap: 9 mEq/L (ref 3–11)
BUN: 13 mg/dL (ref 7.0–26.0)
CALCIUM: 9.3 mg/dL (ref 8.4–10.4)
CHLORIDE: 105 meq/L (ref 98–109)
CO2: 25 mEq/L (ref 22–29)
CREATININE: 1.1 mg/dL (ref 0.6–1.1)
EGFR: 64 mL/min/{1.73_m2} — ABNORMAL LOW (ref >90)
Glucose: 112 mg/dl (ref 70–140)
Potassium: 3.4 mEq/L — ABNORMAL LOW (ref 3.5–5.1)
Sodium: 140 mEq/L (ref 136–145)
TOTAL PROTEIN: 7.3 g/dL (ref 6.4–8.3)
Total Bilirubin: 0.35 mg/dL (ref 0.20–1.20)

## 2015-01-29 NOTE — Telephone Encounter (Signed)
Central will call re ct - patient aware.

## 2015-01-29 NOTE — Progress Notes (Signed)
[place] OFFICE PROGRESS NOTE  Patient Care Team: Rosita Fire, MD as PCP - General (Internal Medicine) Heath Lark, MD as Consulting Physician (Hematology and Oncology) Jodi Marble, MD as Consulting Physician (Otolaryngology) Eppie Gibson, MD as Attending Physician (Radiation Oncology)  SUMMARY OF ONCOLOGIC HISTORY: Oncology History   Bilateral lung cancer   Staging form: Lung, AJCC 6th Edition     Clinical stage from 10/01/2014: Stage IIIB (T4(m), N3, M0) - Signed by Heath Lark, MD on 10/01/2014 Cancer of upper lobe of right lung   Staging form: Lung, AJCC 7th Edition     Clinical: Stage Unknown (TX, N3, M0) - Signed by Eppie Gibson, MD on 10/07/2014       Bilateral lung cancer ([place])   09/05/2014 - 09/12/2014 Hospital Admission The patient was admitted to the hospital for further management of possible skin reaction, dysphagia and supraclavicular lymphadenopathy   09/06/2014 Imaging CT scan of the neck showed malignant appearing superior mediastinal and right thoracic inlet lymphadenopathy   09/10/2014 Imaging CT scan of the abdomen and pelvis confirmed right supraclavicular and mediastinal lymphadenopathy, as described above but no other evidence of metastatic cancer    09/14/2014 Procedure Flexible endoscopy in the ENT office failed to reveal any primary in the head and neck region   09/16/2014 Imaging She failed barium swallow. Aspiration is noted within liquids   09/17/2014 Initial Diagnosis Malignant neoplasm of unknown origin   09/17/2014 Pathology Results Accession: [id number] biopsy of the right supraclavicular region came back poorly differentiated carcinoma with squamous differentiation   09/17/2014 Surgery She underwent excisional lymph node biopsy of the right supraclavicular region   09/29/2014 Imaging PET scan showed no definitive lung/Head & Neck primary   10/02/2014 Imaging MRI brain is negative   10/08/2014 Procedure she had port placement   10/14/2014 - 11/25/2014  Chemotherapy She received weekly carbo/taxol   10/14/2014 - 11/25/2014 Radiation Therapy She received concurrent XRT   10/24/2014 - 10/28/2014 Hospital Admission She was admitted to the hospital for workup of nonspecific chest pain.   01/28/2015 Imaging CT scan showed no active disease    INTERVAL HISTORY: Please see below for problem oriented charting. She feels well. She had mild, nonproductive cough. Denies fevers or chills. Her skin ulcer has healed.  REVIEW OF SYSTEMS:   Constitutional: Denies fevers, chills or abnormal weight loss Eyes: Denies blurriness of vision Ears, nose, mouth, throat, and face: Denies mucositis or sore throat Cardiovascular: Denies palpitation, chest discomfort or lower extremity swelling Gastrointestinal:  Denies nausea, heartburn or change in bowel habits Skin: Denies abnormal skin rashes Lymphatics: Denies new lymphadenopathy or easy bruising Neurological:Denies numbness, tingling or new weaknesses Behavioral/Psych: Mood is stable, no new changes  All other systems were reviewed with the patient and are negative.  I have reviewed the past medical history, past surgical history, social history and family history with the patient and they are unchanged from previous note.  ALLERGIES:  is allergic to hydrocodone-acetaminophen.  MEDICATIONS:  Current Outpatient Prescriptions  Medication Sig Dispense Refill  . amLODipine (NORVASC) 10 MG tablet Take 1 tablet (10 mg total) by mouth daily. 30 tablet 6  . atenolol (TENORMIN) 25 MG tablet Take 1 tablet (25 mg total) by mouth daily. 90 tablet 3  . diphenhydrAMINE (BENADRYL) 25 MG tablet Take 25 mg by mouth every 6 (six) hours as needed for itching or sleep.     Marland Kitchen emollient (BIAFINE) cream Apply topically as needed (apply as directed to Radiation field).    Marland Kitchen  EPINEPHrine 0.3 mg/0.3 mL IJ SOAJ injection Inject 0.3 mLs (0.3 mg total) into the muscle once. 1 Device 0  . fluocinonide cream (LIDEX) 0.17 % Apply 1  application topically 2 (two) times daily.    . hydrochlorothiazide (HYDRODIURIL) 25 MG tablet Take 1 tablet (25 mg total) by mouth daily. 30 tablet 6  . lidocaine (XYLOCAINE) 2 % solution Patient: Mix 1part 2% viscous lidocaine, 1part H20. Swallow 33m of this mixture, 393m before meals and at bedtime, up to QID 100 mL 5  . lidocaine-prilocaine (EMLA) cream Apply to the affected area as directed.  3  . lisinopril (PRINIVIL,ZESTRIL) 40 MG tablet Take 1 tablet (40 mg total) by mouth daily. 30 tablet 6  . morphine (MSIR) 15 MG tablet Take 1 tablet (15 mg total) by mouth every 6 (six) hours as needed. 60 tablet 0  . oxyCODONE-acetaminophen (PERCOCET) 5-325 MG tablet Take 1 tablet by mouth every 4 (four) hours as needed for severe pain. 20 tablet 0  . polyethylene glycol (MIRALAX) packet Take 17 g by mouth daily. 14 each 0  . [facility]    . silver sulfADIAZINE (SILVADENE) 1 % cream Apply 1 application topically 2 (two) times daily.    . sucralfate (CARAFATE) 1 G tablet Dissolve 1 tablet in 10 mL H20 and swallow up to QID for sore throat 60 tablet 5  . triamcinolone cream (KENALOG) 0.1 %   0   No current facility-administered medications for this visit.    PHYSICAL EXAMINATION: ECOG PERFORMANCE STATUS: 1 - Symptomatic but completely ambulatory  Filed Vitals:   01/29/15 0957  BP: 177/82  Pulse: 96  Temp: 98 F (36.7 C)  Resp: 19   Filed Weights   01/29/15 0957  Weight: 124 lb 4.8 oz (56.382 kg)    GENERAL:alert, no distress and comfortable SKIN: skin color, texture, turgor are normal, no rashes or significant lesions EYES: normal, Conjunctiva are pink and non-injected, sclera clear OROPHARYNX:no exudate, no erythema and lips, buccal mucosa, and tongue normal  NECK: supple, thyroid normal size, non-tender, without nodularity LYMPH:  no palpable lymphadenopathy in the cervical, axillary or inguinal LUNGS: clear to auscultation and percussion with  normal breathing effort HEART: regular rate & rhythm and no murmurs and no lower extremity edema ABDOMEN:abdomen soft, non-tender and normal bowel sounds Musculoskeletal:no cyanosis of digits and no clubbing  NEURO: alert & oriented x 3 with fluent speech, no focal motor/sensory deficits  LABORATORY DATA:  I have reviewed the data as listed    Component Value Date/Time   NA 140 01/29/2015 0947   NA 139 11/20/2014 2254   K 3.4* 01/29/2015 0947   K 3.4* 11/20/2014 2254   CL 102 11/20/2014 2254   CO2 25 01/29/2015 0947   CO2 29 11/20/2014 2254   GLUCOSE 112 01/29/2015 0947   GLUCOSE 100* 11/20/2014 2254   BUN 13.0 01/29/2015 0947   BUN 20 11/20/2014 2254   CREATININE 1.1 01/29/2015 0947   CREATININE 1.01* 11/20/2014 2254   CALCIUM 9.3 01/29/2015 0947   CALCIUM 9.0 11/20/2014 2254   PROT 7.3 01/29/2015 0947   PROT 6.6 10/25/2014 0021   ALBUMIN 3.7 01/29/2015 0947   ALBUMIN 3.4* 10/25/2014 0021   AST 13 01/29/2015 0947   AST 20 10/25/2014 0021   ALT <9 01/29/2015 0947   ALT 11* 10/25/2014 0021   ALKPHOS 199* 01/29/2015 0947   ALKPHOS 123 10/25/2014 0021   BILITOT 0.35 01/29/2015 0947   BILITOT 0.5 10/25/2014 0021  GFRNONAA 60* 11/20/2014 2254   GFRAA >60 11/20/2014 2254    No results found for: SPEP, UPEP  Lab Results  Component Value Date   WBC 8.1 01/29/2015   NEUTROABS 6.0 01/29/2015   HGB 12.5 01/29/2015   HCT 37.9 01/29/2015   MCV 96.9 01/29/2015   PLT 243 01/29/2015      Chemistry      Component Value Date/Time   NA 140 01/29/2015 0947   NA 139 11/20/2014 2254   K 3.4* 01/29/2015 0947   K 3.4* 11/20/2014 2254   CL 102 11/20/2014 2254   CO2 25 01/29/2015 0947   CO2 29 11/20/2014 2254   BUN 13.0 01/29/2015 0947   BUN 20 11/20/2014 2254   CREATININE 1.1 01/29/2015 0947   CREATININE 1.01* 11/20/2014 2254      Component Value Date/Time   CALCIUM 9.3 01/29/2015 0947   CALCIUM 9.0 11/20/2014 2254   ALKPHOS 199* 01/29/2015 0947   ALKPHOS 123  10/25/2014 0021   AST 13 01/29/2015 0947   AST 20 10/25/2014 0021   ALT <9 01/29/2015 0947   ALT 11* 10/25/2014 0021   BILITOT 0.35 01/29/2015 0947   BILITOT 0.5 10/25/2014 0021       RADIOGRAPHIC STUDIES: I have personally reviewed the radiological images as listed and agreed with the findings in the report. No results found.   ASSESSMENT & PLAN:  Bilateral lung cancer ([place]) I reviewed imaging study with her and her son. She have difficulties with non-productive coughing which is suspect is due to mucous plugging. I recommend conservative management and breathing exercises. Plan to see her back in 3 months but we will flush her port every 6-8 weeks. If the future scan is negative, we can consider removing her port.  Essential hypertension Her blood pressure is still high. I recommend she follows closely with primary care doctor for chronic blood pressure management    Skin ulcer ([place]) This has healed. No further treatment is necessary.     Orders Placed This Encounter  Procedures  . CT Chest Wo Contrast    Standing Status: Future     Number of Occurrences:      Standing Expiration Date: 03/30/2016    Order Specific Question:  Reason for Exam (SYMPTOM  OR DIAGNOSIS REQUIRED)    Answer:  lung ca, exclude recurrence    Order Specific Question:  Is the patient pregnant?    Answer:  No    Order Specific Question:  Preferred imaging location?    Answer:  [facility]  . CBC with Differential/Platelet    Standing Status: Standing     Number of Occurrences: 9     Standing Expiration Date: 01/29/2016  . Comprehensive metabolic panel    Standing Status: Standing     Number of Occurrences: 9     Standing Expiration Date: 01/29/2016   All questions were answered. The patient knows to call the clinic with any problems, questions or concerns. No barriers to learning was detected. I spent 20 minutes counseling the patient face to face. The total time spent in the  appointment was 25 minutes and more than 50% was on counseling and review of test results     [facility], [place], MD 02/01/2015 9:09 AM

## 2015-01-29 NOTE — Telephone Encounter (Signed)
Gave patient avs report and appointments for January thru April. Patient not able to wait for flush today due to son needs to get to work. Flush for today scheduled for 1/11 and then 2/22 and with 4/9 f/u.

## 2015-02-01 NOTE — Assessment & Plan Note (Signed)
I reviewed imaging study with her and her son. She have difficulties with non-productive coughing which is suspect is due to mucous plugging. I recommend conservative management and breathing exercises. Plan to see her back in 3 months but we will flush her port every 6-8 weeks. If the future scan is negative, we can consider removing her port.

## 2015-02-01 NOTE — Assessment & Plan Note (Signed)
This has healed. No further treatment is necessary.

## 2015-02-01 NOTE — Assessment & Plan Note (Signed)
Her blood pressure is still high. I recommend she follows closely with primary care doctor for chronic blood pressure management

## 2015-02-02 ENCOUNTER — Emergency Department (HOSPITAL_COMMUNITY)
Admission: EM | Admit: 2015-02-02 | Discharge: 2015-02-03 | Disposition: A | Discharge status: Home / Self Care | Payer: Medicaid Other | Attending: Emergency Medicine | Admitting: Emergency Medicine

## 2015-02-02 ENCOUNTER — Encounter (HOSPITAL_COMMUNITY): Payer: Self-pay | Admitting: *Deleted

## 2015-02-02 DIAGNOSIS — Z87891 Personal history of nicotine dependence: Secondary | ICD-10-CM | POA: Diagnosis not present

## 2015-02-02 DIAGNOSIS — Z8619 Personal history of other infectious and parasitic diseases: Secondary | ICD-10-CM | POA: Insufficient documentation

## 2015-02-02 DIAGNOSIS — Z9889 Other specified postprocedural states: Secondary | ICD-10-CM | POA: Insufficient documentation

## 2015-02-02 DIAGNOSIS — T161XXA Foreign body in right ear, initial encounter: Secondary | ICD-10-CM | POA: Diagnosis not present

## 2015-02-02 DIAGNOSIS — Y9289 Other specified places as the place of occurrence of the external cause: Secondary | ICD-10-CM | POA: Insufficient documentation

## 2015-02-02 DIAGNOSIS — Z8709 Personal history of other diseases of the respiratory system: Secondary | ICD-10-CM | POA: Diagnosis not present

## 2015-02-02 DIAGNOSIS — Z859 Personal history of malignant neoplasm, unspecified: Secondary | ICD-10-CM | POA: Insufficient documentation

## 2015-02-02 DIAGNOSIS — Z79899 Other long term (current) drug therapy: Secondary | ICD-10-CM | POA: Insufficient documentation

## 2015-02-02 DIAGNOSIS — Z7952 Long term (current) use of systemic steroids: Secondary | ICD-10-CM | POA: Insufficient documentation

## 2015-02-02 DIAGNOSIS — Z87448 Personal history of other diseases of urinary system: Secondary | ICD-10-CM | POA: Diagnosis not present

## 2015-02-02 DIAGNOSIS — I10 Essential (primary) hypertension: Secondary | ICD-10-CM | POA: Insufficient documentation

## 2015-02-02 DIAGNOSIS — G43909 Migraine, unspecified, not intractable, without status migrainosus: Secondary | ICD-10-CM | POA: Diagnosis not present

## 2015-02-02 DIAGNOSIS — Z792 Long term (current) use of antibiotics: Secondary | ICD-10-CM | POA: Insufficient documentation

## 2015-02-02 DIAGNOSIS — G8929 Other chronic pain: Secondary | ICD-10-CM | POA: Insufficient documentation

## 2015-02-02 DIAGNOSIS — Z872 Personal history of diseases of the skin and subcutaneous tissue: Secondary | ICD-10-CM | POA: Insufficient documentation

## 2015-02-02 DIAGNOSIS — Y998 Other external cause status: Secondary | ICD-10-CM | POA: Diagnosis not present

## 2015-02-02 DIAGNOSIS — X58XXXA Exposure to other specified factors, initial encounter: Secondary | ICD-10-CM | POA: Diagnosis not present

## 2015-02-02 DIAGNOSIS — Z8739 Personal history of other diseases of the musculoskeletal system and connective tissue: Secondary | ICD-10-CM | POA: Insufficient documentation

## 2015-02-02 DIAGNOSIS — Y9389 Activity, other specified: Secondary | ICD-10-CM | POA: Diagnosis not present

## 2015-02-02 DIAGNOSIS — H9201 Otalgia, right ear: Secondary | ICD-10-CM | POA: Diagnosis present

## 2015-02-02 NOTE — ED Notes (Signed)
Pt reports that she believes a bug is in her right ear.  Noticed pain and movement about 2pm.

## 2015-02-03 ENCOUNTER — Other Ambulatory Visit: Payer: Self-pay | Admitting: *Deleted

## 2015-02-03 ENCOUNTER — Ambulatory Visit (HOSPITAL_BASED_OUTPATIENT_CLINIC_OR_DEPARTMENT_OTHER): Payer: Medicaid Other

## 2015-02-03 VITALS — BP 146/77 | HR 100 | Temp 99.0°F | Resp 18

## 2015-02-03 DIAGNOSIS — C3491 Malignant neoplasm of unspecified part of right bronchus or lung: Secondary | ICD-10-CM | POA: Diagnosis not present

## 2015-02-03 DIAGNOSIS — C3411 Malignant neoplasm of upper lobe, right bronchus or lung: Secondary | ICD-10-CM

## 2015-02-03 DIAGNOSIS — Z452 Encounter for adjustment and management of vascular access device: Secondary | ICD-10-CM

## 2015-02-03 DIAGNOSIS — C3492 Malignant neoplasm of unspecified part of left bronchus or lung: Secondary | ICD-10-CM | POA: Diagnosis not present

## 2015-02-03 MED ORDER — HEPARIN SOD (PORK) LOCK FLUSH 100 UNIT/ML IV SOLN
500.0000 [IU] | Freq: Once | INTRAVENOUS | Status: AC
  Administered 2015-02-03: 500 [IU]
  Filled 2015-02-03: qty 5

## 2015-02-03 MED ORDER — SODIUM CHLORIDE 0.9 % IJ SOLN
10.0000 mL | Freq: Once | INTRAMUSCULAR | Status: AC
  Administered 2015-02-03: 10 mL
  Filled 2015-02-03: qty 10

## 2015-02-03 NOTE — Discharge Instructions (Signed)
Ear Foreign Body An ear foreign body is an object that is stuck in your ear. The object is usually stuck in the ear canal. CAUSES In all ages of people, the most common foreign bodies are insects that enter the ear canal. It is common for young children to put objects into the ear canal. These may include pebbles, beads, parts of toys, and any other small objects that fit into the ear. In adults, objects such as cotton swabs may become lodged in the ear canal.  SIGNS AND SYMPTOMS A foreign body in the ear may cause:  Pain.  Buzzing or roaring sounds.  Hearing loss.  Ear drainage or bleeding.  Nausea and vomiting.  A feeling that your ear is full. DIAGNOSIS Your health care provider may be able to diagnose an ear foreign body based on the information that you provide, your symptoms, and a physical exam. Your health care provider may also perform tests, such as testing your hearing and your ear pressure, to check for infection or other problems that are caused by the foreign body in your ear. TREATMENT Treatment depends on what the foreign body is, the location of the foreign body in your ear, and whether or not the foreign body has injured any part of your inner ear. If the foreign body is visible to your health care provider, it may be possible to remove the foreign body using:  A tool, such as medical tweezers (forceps) or a suction tube (catheter).  Irrigation. This uses water to flush the foreign body out of your ear. This is used only if the foreign body is not likely to swell or enlarge when it is put in water. If the foreign body is not visible or your health care provider was not able to remove the foreign body, you may be referred to a specialist for removal. You may also be prescribed antibiotic medicine or ear drops to prevent infection. If the foreign body has caused injury to other parts of your ear, you may need additional treatment. HOME CARE INSTRUCTIONS  Keep all  follow-up visits as directed by your health care provider. This is important.  Take medicines only as directed by your health care provider.  If you were prescribed an antibiotic medicine, finish it all even if you start to feel better. PREVENTION  Keep small objects out of reach of young children. Tell children not to put anything in their ears.  Do not put anything in your ear, including cotton swabs, to clean your ears. Talk to your health care provider about how to clean your ears safely. SEEK MEDICAL CARE IF:  You have a headache.  Your have blood coming from your ear.  You have a fever.  You have increased pain or swelling of your ear.  Your hearing is reduced.  You have discharge coming from your ear.   This information is not intended to replace advice given to you by your health care provider. Make sure you discuss any questions you have with your health care provider.   Document Released: 01/07/2000 Document Revised: 01/30/2014 Document Reviewed: 08/25/2013 Elsevier Interactive Patient Education [employer].

## 2015-02-03 NOTE — ED Notes (Signed)
Pt states understanding of care given and follow up instructions 

## 2015-02-03 NOTE — ED Provider Notes (Signed)
CSN: [id number]     Arrival date & time 02/02/15  2257 History   First MD Initiated Contact with Patient 02/03/15 0011     No chief complaint on file.    (Consider location/radiation/quality/duration/timing/severity/associated sxs/prior Treatment) The history is provided by the patient.   Megan Sims is a 60 y.o. female presenting with pain and movement sensation which is been intermittent since 2 PM today in her right ear, with patient being suspicious there may be an insect causing the symptoms.  She denies drainage from the ear, decreased hearing acuity, tinnitus or dizziness.  She has tried flushing the ear several times with transient improvement in her symptoms.      Past Medical History  Diagnosis Date  . Migraine   . Lung abnormality   . Hypertension   . Pleurisy   . Cerebral aneurysm 2 brain surgeries 96 or 97  . Paget disease of bone   . Diarrhea   . Renal insufficiency     Patient states " no kidney problems  . Malignant neoplasm of unknown origin ([place])   . S/P biopsy     of throat per patient.  . Oral thrush 09/24/2014  . Nicotine dependence 09/24/2014  . Chronic neck pain 09/24/2014  . Cancer [facility])     malignant neoplasm of unknown origin  . Skin ulcer ([place]) 11/18/2014   Past Surgical History  Procedure Laterality Date  . Chest tube insertion    . Tubal ligation    . Cerebral aneurysm repair  96 or 97  . Colonoscopy     Family History  Problem Relation Age of Onset  . Hypertension    . Diabetes    . Kidney disease    . Cancer Mother     throat ca  . Cancer Maternal Grandmother     thyroid ca   Social History  Substance Use Topics  . Smoking status: Former Smoker -- 0.50 packs/day for 34 years    Types: Cigarettes  . Smokeless tobacco: Never Used  . Alcohol Use: No   OB History    Gravida Para Term Preterm AB TAB SAB Ectopic Multiple Living            3     Review of Systems  Constitutional: Negative for fever.  HENT: Positive for ear pain.  Negative for congestion, hearing loss, rhinorrhea, sinus pressure, sore throat, trouble swallowing and voice change.   Eyes: Negative for discharge.  Respiratory: Negative for cough, shortness of breath, wheezing and stridor.   Cardiovascular: Negative for chest pain.  Gastrointestinal: Negative for abdominal pain.  Genitourinary: Negative.   Neurological: Negative for dizziness and headaches.      Allergies  Hydrocodone-acetaminophen  Home Medications   Prior to Admission medications   Medication Sig Start Date End Date Taking? Authorizing Provider  amLODipine (NORVASC) 10 MG tablet Take 1 tablet (10 mg total) by mouth daily. 11/11/14   Heath Lark, MD  atenolol (TENORMIN) 25 MG tablet Take 1 tablet (25 mg total) by mouth daily. 12/30/14   Heath Lark, MD  diphenhydrAMINE (BENADRYL) 25 MG tablet Take 25 mg by mouth every 6 (six) hours as needed for itching or sleep.     Historical Provider, MD  emollient (BIAFINE) cream Apply topically as needed (apply as directed to Radiation field).    Historical Provider, MD  EPINEPHrine 0.3 mg/0.3 mL IJ SOAJ injection Inject 0.3 mLs (0.3 mg total) into the muscle once. 09/12/14   Cherene Altes, MD  fluocinonide cream (LIDEX) 8.33 % Apply 1 application topically 2 (two) times daily.    Historical Provider, MD  hydrochlorothiazide (HYDRODIURIL) 25 MG tablet Take 1 tablet (25 mg total) by mouth daily. 11/04/14   Heath Lark, MD  lidocaine (XYLOCAINE) 2 % solution Patient: Mix 1part 2% viscous lidocaine, 1part H20. Swallow 99m of this mixture, 365m before meals and at bedtime, up to QID 11/02/14   SaEppie GibsonMD  lidocaine-prilocaine (EMLA) cream Apply to the affected area as directed. 10/12/14   Historical Provider, MD  lisinopril (PRINIVIL,ZESTRIL) 40 MG tablet Take 1 tablet (40 mg total) by mouth daily. 12/10/14   NiHeath LarkMD  morphine (MSIR) 15 MG tablet Take 1 tablet (15 mg total) by mouth every 6 (six) hours as needed. 10/01/14   NiHeath LarkMD   oxyCODONE-acetaminophen (PERCOCET) 5-325 MG tablet Take 1 tablet by mouth every 4 (four) hours as needed for severe pain. 11/21/14   ElDaleen BoMD  polyethylene glycol (MInova Loudoun Hospitalpacket Take 17 g by mouth daily. 10/01/14   NiHeath LarkMD  [facility]    Historical Provider, MD  silver sulfADIAZINE (SILVADENE) 1 % cream Apply 1 application topically 2 (two) times daily.    Historical Provider, MD  sucralfate (CARAFATE) 1 G tablet Dissolve 1 tablet in 10 mL H20 and swallow up to QID for sore throat 11/02/14   SaEppie GibsonMD  triamcinolone cream (KENALOG) 0.1 %  12/01/14   Historical Provider, MD   BP 150/88 mmHg  Pulse 107  Temp(Src) 99.2 F (37.3 C) (Oral)  Resp 18  Ht '5\' 2"'$  (1.575 m)  Wt 56.246 kg  BMI 22.67 kg/m2  SpO2 97% Physical Exam  Constitutional: She is oriented to person, place, and time. She appears well-developed and well-nourished. No distress.  HENT:  Head: Normocephalic and atraumatic.  Right Ear: Tympanic membrane and ear canal normal.  Left Ear: Tympanic membrane and ear canal normal.  Mouth/Throat: Uvula is midline, oropharynx is clear and moist and mucous membranes are normal. No oropharyngeal exudate, posterior oropharyngeal edema, posterior oropharyngeal erythema or tonsillar abscesses.  Pt examined post flushing by RN who removed a small gnat like insect from the ear.    Pulmonary/Chest: Effort normal.  Neurological: She is alert and oriented to person, place, and time.  Skin: Skin is warm and dry.  Psychiatric: She has a normal mood and affect.    ED Course  Procedures (including critical care time) Labs Review Labs Reviewed - No data to display  Imaging Review No results found. I have personally reviewed and evaluated these images and lab results as part of my medical decision-making.   EKG Interpretation None      MDM   Final diagnoses:  Ear foreign body, right, initial encounter    Prn f/u  anticipated.    JuEvalee JeffersonPA-C 02/03/15 [id number] 02/03/15 0422

## 2015-02-18 ENCOUNTER — Encounter (HOSPITAL_COMMUNITY): Payer: Self-pay | Admitting: Emergency Medicine

## 2015-02-18 ENCOUNTER — Emergency Department (HOSPITAL_COMMUNITY): Payer: Medicaid Other

## 2015-02-18 ENCOUNTER — Emergency Department (HOSPITAL_COMMUNITY)
Admission: EM | Admit: 2015-02-18 | Discharge: 2015-02-18 | Disposition: A | Discharge status: Home / Self Care | Payer: Medicaid Other | Attending: Emergency Medicine | Admitting: Emergency Medicine

## 2015-02-18 DIAGNOSIS — R002 Palpitations: Secondary | ICD-10-CM | POA: Diagnosis present

## 2015-02-18 DIAGNOSIS — R42 Dizziness and giddiness: Secondary | ICD-10-CM | POA: Insufficient documentation

## 2015-02-18 DIAGNOSIS — G8929 Other chronic pain: Secondary | ICD-10-CM | POA: Diagnosis not present

## 2015-02-18 DIAGNOSIS — Z79899 Other long term (current) drug therapy: Secondary | ICD-10-CM | POA: Insufficient documentation

## 2015-02-18 DIAGNOSIS — Z87448 Personal history of other diseases of urinary system: Secondary | ICD-10-CM | POA: Diagnosis not present

## 2015-02-18 DIAGNOSIS — M5412 Radiculopathy, cervical region: Secondary | ICD-10-CM | POA: Diagnosis not present

## 2015-02-18 DIAGNOSIS — Z872 Personal history of diseases of the skin and subcutaneous tissue: Secondary | ICD-10-CM | POA: Diagnosis not present

## 2015-02-18 DIAGNOSIS — Z87891 Personal history of nicotine dependence: Secondary | ICD-10-CM | POA: Diagnosis not present

## 2015-02-18 DIAGNOSIS — R200 Anesthesia of skin: Secondary | ICD-10-CM | POA: Diagnosis not present

## 2015-02-18 DIAGNOSIS — Z8619 Personal history of other infectious and parasitic diseases: Secondary | ICD-10-CM | POA: Insufficient documentation

## 2015-02-18 DIAGNOSIS — I10 Essential (primary) hypertension: Secondary | ICD-10-CM | POA: Diagnosis not present

## 2015-02-18 DIAGNOSIS — Z859 Personal history of malignant neoplasm, unspecified: Secondary | ICD-10-CM | POA: Insufficient documentation

## 2015-02-18 LAB — CBC WITH DIFFERENTIAL/PLATELET
Basophils Absolute: 0 K/uL (ref 0.0–0.1)
Basophils Relative: 0 %
Eosinophils Absolute: 0.2 K/uL (ref 0.0–0.7)
Eosinophils Relative: 3 %
HCT: 34.8 % — ABNORMAL LOW (ref 36.0–46.0)
Hemoglobin: 11.4 g/dL — ABNORMAL LOW (ref 12.0–15.0)
Lymphocytes Relative: 26 %
Lymphs Abs: 1.6 K/uL (ref 0.7–4.0)
MCH: 31.7 pg (ref 26.0–34.0)
MCHC: 32.8 g/dL (ref 30.0–36.0)
MCV: 96.7 fL (ref 78.0–100.0)
Monocytes Absolute: 0.6 K/uL (ref 0.1–1.0)
Monocytes Relative: 9 %
Neutro Abs: 3.7 K/uL (ref 1.7–7.7)
Neutrophils Relative %: 62 %
Platelets: 251 K/uL (ref 150–400)
RBC: 3.6 MIL/uL — ABNORMAL LOW (ref 3.87–5.11)
RDW: 16.7 % — ABNORMAL HIGH (ref 11.5–15.5)
WBC: 6 K/uL (ref 4.0–10.5)

## 2015-02-18 LAB — BASIC METABOLIC PANEL
ANION GAP: 6 (ref 5–15)
BUN: 15 mg/dL (ref 6–20)
CO2: 28 mmol/L (ref 22–32)
Calcium: 9.4 mg/dL (ref 8.9–10.3)
Chloride: 106 mmol/L (ref 101–111)
Creatinine, Ser: 1.08 mg/dL — ABNORMAL HIGH (ref 0.44–1.00)
GFR, EST NON AFRICAN AMERICAN: 55 mL/min — AB (ref >60)
GLUCOSE: 104 mg/dL — AB (ref 65–99)
POTASSIUM: 3.4 mmol/L — AB (ref 3.5–5.1)
Sodium: 140 mmol/L (ref 135–145)

## 2015-02-18 LAB — TROPONIN I: Troponin I: <0.03 ng/mL (ref <0.031)

## 2015-02-18 MED ORDER — DIPHENHYDRAMINE HCL 25 MG PO CAPS
25.0000 mg | ORAL_CAPSULE | Freq: Once | ORAL | Status: AC
  Administered 2015-02-18: 25 mg via ORAL
  Filled 2015-02-18: qty 1

## 2015-02-18 MED ORDER — OXYCODONE-ACETAMINOPHEN 5-325 MG PO TABS
1.0000 | ORAL_TABLET | Freq: Once | ORAL | Status: AC
  Administered 2015-02-18: 1 via ORAL
  Filled 2015-02-18: qty 1

## 2015-02-18 NOTE — ED Notes (Signed)
Pt ambulated to BR without assistance

## 2015-02-18 NOTE — ED Notes (Signed)
Pt c/o heart racing and left arm numbness since about 1800.

## 2015-02-18 NOTE — ED Provider Notes (Signed)
CSN: [id number]     Arrival date & time 02/18/15  2055 History   First MD Initiated Contact with Patient 02/18/15 2138     Chief Complaint  Patient presents with  . Palpitations     (Consider location/radiation/quality/duration/timing/severity/associated sxs/prior Treatment) The history is provided by the patient.   Megan Sims is a 60 y.o. female with past medical history listed below, most significant for lung cancer with last chemotherapy treatment one month ago presenting with a nearly 3 hour history of constant palpitations along with vague dizziness, resolving prior to arrival.  In addition she notes left sided upper and lower extremity numbness but also pain in her left neck starting at the same time as the palpitations. She has a suspected cervical spine nerve impingement and has received one steroid injection within the past several months but with no resolution.  This has been worse the past few days. She denies any falls or new neck injuries.  She denies having chest pain or shortness of breath during this episode of palpitations which is currently resolved.     Past Medical History  Diagnosis Date  . Migraine   . Lung abnormality   . Hypertension   . Pleurisy   . Cerebral aneurysm 2 brain surgeries 96 or 97  . Paget disease of bone   . Diarrhea   . Renal insufficiency     Patient states " no kidney problems  . Malignant neoplasm of unknown origin ([place])   . S/P biopsy     of throat per patient.  . Oral thrush 09/24/2014  . Nicotine dependence 09/24/2014  . Chronic neck pain 09/24/2014  . Cancer [facility])     malignant neoplasm of unknown origin  . Skin ulcer ([place]) 11/18/2014   Past Surgical History  Procedure Laterality Date  . Chest tube insertion    . Tubal ligation    . Cerebral aneurysm repair  96 or 97  . Colonoscopy     Family History  Problem Relation Age of Onset  . Hypertension    . Diabetes    . Kidney disease    . Cancer Mother     throat ca  . Cancer  Maternal Grandmother     thyroid ca   Social History  Substance Use Topics  . Smoking status: Former Smoker -- 0.50 packs/day for 34 years    Types: Cigarettes  . Smokeless tobacco: Never Used  . Alcohol Use: No   OB History    Gravida Para Term Preterm AB TAB SAB Ectopic Multiple Living            3     Review of Systems  Constitutional: Negative for fever.  HENT: Negative for congestion and sore throat.   Eyes: Negative.   Respiratory: Negative for cough, chest tightness, shortness of breath and wheezing.   Cardiovascular: Positive for palpitations. Negative for chest pain and leg swelling.  Gastrointestinal: Negative for nausea and abdominal pain.  Genitourinary: Negative.   Musculoskeletal: Positive for neck pain. Negative for joint swelling and arthralgias.  Skin: Negative.  Negative for rash and wound.  Neurological: Positive for dizziness and numbness. Negative for weakness, light-headedness and headaches.  Psychiatric/Behavioral: Negative.       Allergies  Hydrocodone-acetaminophen  Home Medications   Prior to Admission medications   Medication Sig Start Date End Date Taking? Authorizing Provider  acetaminophen (TYLENOL) 500 MG tablet Take 500 mg by mouth every 6 (six) hours as needed for mild pain or  moderate pain.   Yes Historical Provider, MD  Aloe Vera GEL Apply 1 application topically daily as needed (for skin relief).   Yes Historical Provider, MD  amLODipine (NORVASC) 10 MG tablet Take 1 tablet (10 mg total) by mouth daily. 11/11/14  Yes Heath Lark, MD  atenolol (TENORMIN) 25 MG tablet Take 1 tablet (25 mg total) by mouth daily. 12/30/14  Yes Heath Lark, MD  EPINEPHrine 0.3 mg/0.3 mL IJ SOAJ injection Inject 0.3 mLs (0.3 mg total) into the muscle once. 09/12/14  Yes Cherene Altes, MD  hydrochlorothiazide (HYDRODIURIL) 25 MG tablet Take 1 tablet (25 mg total) by mouth daily. 11/04/14  Yes Heath Lark, MD  lidocaine-prilocaine (EMLA) cream Apply to the  affected area as directed. 10/12/14  Yes Historical Provider, MD  lisinopril (PRINIVIL,ZESTRIL) 40 MG tablet Take 1 tablet (40 mg total) by mouth daily. 12/10/14  Yes Heath Lark, MD  Naproxen Sodium (ALEVE) 220 MG CAPS Take 1 capsule by mouth daily as needed (for pain).   Yes Historical Provider, MD  polyethylene glycol (MIRALAX) packet Take 17 g by mouth daily. Patient taking differently: Take 17 g by mouth daily as needed for mild constipation or moderate constipation.  10/01/14  Yes Heath Lark, MD  morphine (MSIR) 15 MG tablet Take 1 tablet (15 mg total) by mouth every 6 (six) hours as needed. Patient not taking: Reported on 02/18/2015 10/01/14   Heath Lark, MD  oxyCODONE-acetaminophen (PERCOCET) 5-325 MG tablet Take 1 tablet by mouth every 4 (four) hours as needed for severe pain. Patient not taking: Reported on 02/18/2015 11/21/14   Daleen Bo, MD   BP 141/91 mmHg  Pulse 79  Temp(Src) 98 F (36.7 C) (Oral)  Resp 14  Ht '5\' 1"'$  (1.549 m)  Wt 56.246 kg  BMI 23.44 kg/m2  SpO2 98% Physical Exam  Constitutional: She appears well-developed and well-nourished.  HENT:  Head: Normocephalic and atraumatic.  Eyes: Conjunctivae are normal.  Neck: Normal range of motion.  Cardiovascular: Normal rate, regular rhythm, normal heart sounds and intact distal pulses.   Pulmonary/Chest: Effort normal and breath sounds normal. She has no wheezes.  Abdominal: Soft. Bowel sounds are normal. There is no tenderness.  Musculoskeletal: Normal range of motion.  ttp left paracervical c spine.  Neurological: She is alert. She has normal strength. A sensory deficit is present. No cranial nerve deficit. She exhibits normal muscle tone. Gait normal.  Reflex Scores:      Bicep reflexes are 2+ on the right side and 2+ on the left side. Negative pronator drift.  Decreased sensation to fine touch in left forearm.  Equal grip strength.    Skin: Skin is warm and dry.  Psychiatric: She has a normal mood and affect.   Nursing note and vitals reviewed.   ED Course  Procedures (including critical care time) Labs Review Labs Reviewed  CBC WITH DIFFERENTIAL/PLATELET - Abnormal; Notable for the following:    RBC 3.60 (*)    Hemoglobin 11.4 (*)    HCT 34.8 (*)    RDW 16.7 (*)    All other components within normal limits  BASIC METABOLIC PANEL - Abnormal; Notable for the following:    Potassium 3.4 (*)    Glucose, Bld 104 (*)    Creatinine, Ser 1.08 (*)    GFR calc non Af Amer 55 (*)    All other components within normal limits  TROPONIN I    Imaging Review Dg Chest 2 View  02/18/2015  CLINICAL DATA:  Acute onset of palpitations. Left-sided arm and neck pain. Dizziness. Nonproductive cough. Initial encounter. EXAM: CHEST  2 VIEW COMPARISON:  Chest radiograph performed 11/20/2014, and CT of the chest performed 01/28/2015 FINDINGS: The lungs are well-aerated and clear. There is no evidence of focal opacification, pleural effusion or pneumothorax. The heart is borderline normal in size. A left-sided chest port is noted ending about the mid SVC. No acute osseous abnormalities are seen. IMPRESSION: No acute cardiopulmonary process seen. Electronically Signed   By: Garald Balding M.D.   On: 02/18/2015 22:41   Ct Head Wo Contrast  02/18/2015  CLINICAL DATA:  Headache and left-sided numbness beginning this evening. Initial encounter. EXAM: CT HEAD WITHOUT CONTRAST TECHNIQUE: Contiguous axial images were obtained from the base of the skull through the vertex without intravenous contrast. COMPARISON:  Head CT scan 10/25/2014. FINDINGS: The patient is status post bilateral pterional craniotomy. Aneurysm clips bilaterally about the circle of Willis are again seen. Gliosis in the right temporal lobe is unchanged. No evidence of acute abnormality including hemorrhage, infarct, mass lesion, mass effect, midline shift or abnormal extra-axial fluid collection is seen. No acute fracture. Remote fracture medial wall right  orbit noted. IMPRESSION: No acute abnormality.  Stable compared to prior exam. Electronically Signed   By: Inge Rise M.D.   On: 02/18/2015 22:48   I have personally reviewed and evaluated these images and lab results as part of my medical decision-making.   EKG Interpretation   Date/Time:  Thursday February 18 2015 21:07:21 EST Ventricular Rate:  91 PR Interval:  184 QRS Duration: 84 QT Interval:  370 QTC Calculation: 455 R Axis:   45 Text Interpretation:  Normal sinus rhythm Possible Left atrial enlargement  Anterior infarct , age undetermined Abnormal ECG No significant change  since last tracing Confirmed by KNAPP  MD-J, JON ([id number]) on 02/18/2015  9:11:32 PM      MDM   Final diagnoses:  Heart palpitations  Chronic cervical radiculopathy    Patients labs reviewed.  Radiological studies were viewed, interpreted and considered during the medical decision making and disposition process. I agree with radiologists reading.  Results were also discussed with patient. Pt is sx free at time of dc except for her chronic cervical pain. Advised f/u with pcp prn and consider recheck by provider who did her last steroid injection (pt unsure of providers name).  Referral given to cardiology for further eval of palpitations - may benefit from an event monitor.  The patient appears reasonably screened and/or stabilized for discharge and I doubt any other medical condition or other [facility] requiring further screening, evaluation, or treatment in the ED at this time prior to discharge.      Evalee Jefferson, PA-C 02/18/15 2347  Dorie Rank, MD 02/20/15 [phone]

## 2015-02-18 NOTE — Discharge Instructions (Signed)
Holter Monitoring  A Holter monitor is a small device that is used to detect abnormal heart rhythms. It clips to your clothing and is connected by wires to flat, sticky disks (electrodes) that attach to your chest. It is worn continuously for 24-48 hours.  HOME CARE INSTRUCTIONS   Wear your Holter monitor at all times, even while exercising and sleeping, for as long as directed by your health care provider.   Make sure that the Holter monitor is safely clipped to your clothing or close to your body as recommended by your health care provider.   Do not get the monitor or wires wet.   Do not put body lotion or moisturizer on your chest.   Keep your skin clean.   Keep a diary of your daily activities, such as walking and doing chores. If you feel that your heartbeat is abnormal or that your heart is fluttering or skipping a beat:    Record what you are doing when it happens.    Record what time of day the symptoms occur.   Return your Holter monitor as directed by your health care provider.   Keep all follow-up visits as directed by your health care provider. This is important.  SEEK IMMEDIATE MEDICAL CARE IF:   You feel lightheaded or you faint.   You have trouble breathing.   You feel pain in your chest, upper arm, or jaw.   You feel sick to your stomach and your skin is pale, cool, or damp.   You heartbeat feels unusual or abnormal.     This information is not intended to replace advice given to you by your health care provider. Make sure you discuss any questions you have with your health care provider.     Document Released: 10/08/2003 Document Revised: 01/30/2014 Document Reviewed: 08/18/2013  Elsevier Interactive Patient Education 2016 Else[employer] Inc[employer]  Palpitations  A palpitation is the feeling that your heartbeat is irregular or is faster than normal. It may feel like your heart is fluttering or skipping a beat. Palpitations are usually not a serious problem. However, in some cases, you may need  further medical evaluation.  CAUSES   Palpitations can be caused by:   Smoking.   Caffeine or other stimulants, such as diet pills or energy drinks.   Alcohol.   Stress and anxiety.   Strenuous physical activity.   Fatigue.   Certain medicines.   Heart disease, especially if you have a history of irregular heart rhythms (arrhythmias), such as atrial fibrillation, atrial flutter, or supraventricular tachycardia.   An improperly working pacemaker or defibrillator.  DIAGNOSIS   To find the cause of your palpitations, your health care provider will take your medical history and perform a physical exam. Your health care provider may also have you take a test called an ambulatory electrocardiogram (ECG). An ECG records your heartbeat patterns over a 24-hour period. You may also have other tests, such as:   Transthoracic echocardiogram (TTE). During echocardiography, sound waves are used to evaluate how blood flows through your heart.   Transesophageal echocardiogram (TEE).   Cardiac monitoring. This allows your health care provider to monitor your heart rate and rhythm in real time.   Holter monitor. This is a portable device that records your heartbeat and can help diagnose heart arrhythmias. It allows your health care provider to track your heart activity for several days, if needed.   Stress tests by exercise or by giving medicine that makes the heart beat faster.    TREATMENT   Treatment of palpitations depends on the cause of your symptoms and can vary greatly. Most cases of palpitations do not require any treatment other than time, relaxation, and monitoring your symptoms. Other causes, such as atrial fibrillation, atrial flutter, or supraventricular tachycardia, usually require further treatment.  HOME CARE INSTRUCTIONS    Avoid:    Caffeinated coffee, tea, soft drinks, diet pills, and energy drinks.    Chocolate.    Alcohol.   Stop smoking if you smoke.   Reduce your stress and anxiety. Things that  can help you relax include:    A method of controlling things in your body, such as your heartbeats, with your mind (biofeedback).    Yoga.    Meditation.    Physical activity such as swimming, jogging, or walking.   Get plenty of rest and sleep.  SEEK MEDICAL CARE IF:    You continue to have a fast or irregular heartbeat beyond 24 hours.   Your palpitations occur more often.  SEEK IMMEDIATE MEDICAL CARE IF:   You have chest pain or shortness of breath.   You have a severe headache.   You feel dizzy or you faint.  MAKE SURE YOU:   Understand these instructions.   Will watch your condition.   Will get help right away if you are not doing well or get worse.     This information is not intended to replace advice given to you by your health care provider. Make sure you discuss any questions you have with your health care provider.     Document Released: 01/07/2000 Document Revised: 01/14/2013 Document Reviewed: 03/10/2011  Elsevier Interactive Patient Education 2016 Elsevier Inc.

## 2015-03-17 ENCOUNTER — Ambulatory Visit (HOSPITAL_BASED_OUTPATIENT_CLINIC_OR_DEPARTMENT_OTHER): Payer: Medicaid Other

## 2015-03-17 VITALS — BP 170/90 | HR 98 | Temp 98.2°F | Resp 20

## 2015-03-17 DIAGNOSIS — C3491 Malignant neoplasm of unspecified part of right bronchus or lung: Secondary | ICD-10-CM

## 2015-03-17 DIAGNOSIS — Z95828 Presence of other vascular implants and grafts: Secondary | ICD-10-CM

## 2015-03-17 DIAGNOSIS — Z452 Encounter for adjustment and management of vascular access device: Secondary | ICD-10-CM

## 2015-03-17 MED ORDER — SODIUM CHLORIDE 0.9% FLUSH
10.0000 mL | INTRAVENOUS | Status: DC | PRN
  Administered 2015-03-17: 10 mL via INTRAVENOUS
  Filled 2015-03-17: qty 10

## 2015-03-17 MED ORDER — HEPARIN SOD (PORK) LOCK FLUSH 100 UNIT/ML IV SOLN
500.0000 [IU] | Freq: Once | INTRAVENOUS | Status: AC
  Administered 2015-03-17: 500 [IU] via INTRAVENOUS
  Filled 2015-03-17: qty 5

## 2015-03-17 NOTE — Patient Instructions (Signed)

## 2015-04-09 ENCOUNTER — Emergency Department (HOSPITAL_COMMUNITY): Payer: Medicaid Other

## 2015-04-09 ENCOUNTER — Inpatient Hospital Stay (HOSPITAL_COMMUNITY)
Admission: EM | Admit: 2015-04-09 | Discharge: 2015-04-13 | DRG: 871 | Disposition: A | Discharge status: Home / Self Care | Payer: Medicaid Other | Attending: Internal Medicine | Admitting: Internal Medicine

## 2015-04-09 ENCOUNTER — Encounter (HOSPITAL_COMMUNITY): Payer: Self-pay

## 2015-04-09 DIAGNOSIS — Z808 Family history of malignant neoplasm of other organs or systems: Secondary | ICD-10-CM | POA: Diagnosis not present

## 2015-04-09 DIAGNOSIS — Z85118 Personal history of other malignant neoplasm of bronchus and lung: Secondary | ICD-10-CM

## 2015-04-09 DIAGNOSIS — Z8249 Family history of ischemic heart disease and other diseases of the circulatory system: Secondary | ICD-10-CM

## 2015-04-09 DIAGNOSIS — Z9221 Personal history of antineoplastic chemotherapy: Secondary | ICD-10-CM | POA: Diagnosis not present

## 2015-04-09 DIAGNOSIS — Z833 Family history of diabetes mellitus: Secondary | ICD-10-CM | POA: Diagnosis not present

## 2015-04-09 DIAGNOSIS — A419 Sepsis, unspecified organism: Principal | ICD-10-CM | POA: Diagnosis present

## 2015-04-09 DIAGNOSIS — E871 Hypo-osmolality and hyponatremia: Secondary | ICD-10-CM | POA: Diagnosis present

## 2015-04-09 DIAGNOSIS — J189 Pneumonia, unspecified organism: Secondary | ICD-10-CM | POA: Diagnosis present

## 2015-04-09 DIAGNOSIS — F17200 Nicotine dependence, unspecified, uncomplicated: Secondary | ICD-10-CM | POA: Diagnosis present

## 2015-04-09 DIAGNOSIS — R509 Fever, unspecified: Secondary | ICD-10-CM | POA: Diagnosis not present

## 2015-04-09 DIAGNOSIS — R6520 Severe sepsis without septic shock: Secondary | ICD-10-CM | POA: Diagnosis present

## 2015-04-09 DIAGNOSIS — E860 Dehydration: Secondary | ICD-10-CM | POA: Diagnosis present

## 2015-04-09 DIAGNOSIS — I10 Essential (primary) hypertension: Secondary | ICD-10-CM | POA: Diagnosis present

## 2015-04-09 DIAGNOSIS — Z87891 Personal history of nicotine dependence: Secondary | ICD-10-CM

## 2015-04-09 DIAGNOSIS — N179 Acute kidney failure, unspecified: Secondary | ICD-10-CM | POA: Diagnosis present

## 2015-04-09 LAB — CBC WITH DIFFERENTIAL/PLATELET
BASOS ABS: 0 10*3/uL (ref 0.0–0.1)
Basophils Relative: 0 %
EOS ABS: 0 10*3/uL (ref 0.0–0.7)
EOS PCT: 0 %
HCT: 37.9 % (ref 36.0–46.0)
Hemoglobin: 12.7 g/dL (ref 12.0–15.0)
LYMPHS PCT: 11 %
Lymphs Abs: 1.3 10*3/uL (ref 0.7–4.0)
MCH: 30.6 pg (ref 26.0–34.0)
MCHC: 33.5 g/dL (ref 30.0–36.0)
MCV: 91.3 fL (ref 78.0–100.0)
MONO ABS: 1.7 10*3/uL — AB (ref 0.1–1.0)
Monocytes Relative: 15 %
Neutro Abs: 8.7 10*3/uL — ABNORMAL HIGH (ref 1.7–7.7)
Neutrophils Relative %: 74 %
PLATELETS: 190 10*3/uL (ref 150–400)
RBC: 4.15 MIL/uL (ref 3.87–5.11)
RDW: 15.4 % (ref 11.5–15.5)
WBC: 11.6 10*3/uL — AB (ref 4.0–10.5)

## 2015-04-09 LAB — COMPREHENSIVE METABOLIC PANEL
ALBUMIN: 3.5 g/dL (ref 3.5–5.0)
ALK PHOS: 126 U/L (ref 38–126)
ALT: 12 U/L — AB (ref 14–54)
AST: 33 U/L (ref 15–41)
Anion gap: 11 (ref 5–15)
BUN: 31 mg/dL — AB (ref 6–20)
CO2: 23 mmol/L (ref 22–32)
CREATININE: 1.56 mg/dL — AB (ref 0.44–1.00)
Calcium: 8.4 mg/dL — ABNORMAL LOW (ref 8.9–10.3)
Chloride: 100 mmol/L — ABNORMAL LOW (ref 101–111)
GFR calc Af Amer: 41 mL/min — ABNORMAL LOW (ref >60)
GFR calc non Af Amer: 35 mL/min — ABNORMAL LOW (ref >60)
GLUCOSE: 136 mg/dL — AB (ref 65–99)
Potassium: 3.6 mmol/L (ref 3.5–5.1)
SODIUM: 134 mmol/L — AB (ref 135–145)
TOTAL PROTEIN: 7.3 g/dL (ref 6.5–8.1)
Total Bilirubin: 0.6 mg/dL (ref 0.3–1.2)

## 2015-04-09 LAB — I-STAT CG4 LACTIC ACID, ED: Lactic Acid, Venous: 1.26 mmol/L (ref 0.5–2.0)

## 2015-04-09 MED ORDER — DOXYCYCLINE HYCLATE 100 MG IV SOLR
INTRAVENOUS | Status: AC
  Filled 2015-04-09: qty 100

## 2015-04-09 MED ORDER — SODIUM CHLORIDE 0.9 % IV BOLUS (SEPSIS)
1000.0000 mL | INTRAVENOUS | Status: AC
  Administered 2015-04-09 – 2015-04-10 (×2): 1000 mL via INTRAVENOUS

## 2015-04-09 MED ORDER — SODIUM CHLORIDE 0.9 % IV SOLN
INTRAVENOUS | Status: DC
Start: 2015-04-10 — End: 2015-04-10
  Administered 2015-04-10: 01:00:00 via INTRAVENOUS

## 2015-04-09 MED ORDER — ACETAMINOPHEN 500 MG PO TABS
ORAL_TABLET | ORAL | Status: AC
  Administered 2015-04-09: 1000 mg
  Filled 2015-04-09: qty 2

## 2015-04-09 MED ORDER — DOXYCYCLINE HYCLATE 100 MG IV SOLR
100.0000 mg | Freq: Once | INTRAVENOUS | Status: AC
  Administered 2015-04-10: 100 mg via INTRAVENOUS
  Filled 2015-04-09: qty 100

## 2015-04-09 MED ORDER — CEFTRIAXONE SODIUM 1 G IJ SOLR
1.0000 g | Freq: Once | INTRAMUSCULAR | Status: AC
  Administered 2015-04-09: 1 g via INTRAVENOUS
  Filled 2015-04-09: qty 10

## 2015-04-09 NOTE — ED Notes (Signed)
My chest is hurting, my back is hurting, having pain in my face and in my ear per pt.  Started on Tuesday and I slept for 2 days and now it is getting worse. Having shortness of breath.

## 2015-04-09 NOTE — ED Provider Notes (Signed)
CSN: [id number]     Arrival date & time 04/09/15  2121 History  By signing my name below, I, [name], attest that this documentation has been prepared under the direction and in the presence of Elnora Morrison, MD. Electronically Signed: [name], ED Scribe. 04/09/2015. 10:39 PM.     Chief Complaint  Patient presents with  . Shortness of Breath   The history is provided by the patient. No language interpreter was used.   HPI Comments: Megan Sims is a 60 y.o. female former smoker with a PMHx of cancer, renal insufficiency, cerebral aneurism, pleurisy, lung abnormality, and HTN who presents to the Emergency Department complaining of SOB onset 1 week ago. She reports associated cough, post-tussive chest and back pain, a bitter taste in the mouth, loss of appetite, rash to the face, sore throat, mild abdominal pain (resolved), and generalized myalgias. She notes she feels fine in the daytime, but then worsens significantly starting at 6 or 7 PM. She notes a PMHx of stage 3 lung cancer, for which she finished chemotherapy on November 6th, 2015. She reports she still has a port. She states that she did have cough with her cancer, but not as painful as this, and not with all of the other symptoms. She also notes she was told by her pulmonologist that her lung was "inflated." She denies a PMHx of CHF, PE, and DVT. Pt denies fever.  Pt is allergic to hydrocodone-acetaminophen.   Past Medical History  Diagnosis Date  . Migraine   . Lung abnormality   . Hypertension   . Pleurisy   . Cerebral aneurysm 2 brain surgeries 96 or 97  . Paget disease of bone   . Diarrhea   . Renal insufficiency     Patient states " no kidney problems  . Malignant neoplasm of unknown origin ([place])   . S/P biopsy     of throat per patient.  . Oral thrush 09/24/2014  . Nicotine dependence 09/24/2014  . Chronic neck pain 09/24/2014  . Cancer [facility])     malignant neoplasm of unknown origin  . Skin ulcer ([place])  11/18/2014   Past Surgical History  Procedure Laterality Date  . Chest tube insertion    . Tubal ligation    . Cerebral aneurysm repair  96 or 97  . Colonoscopy     Family History  Problem Relation Age of Onset  . Hypertension    . Diabetes    . Kidney disease    . Cancer Mother     throat ca  . Cancer Maternal Grandmother     thyroid ca   Social History  Substance Use Topics  . Smoking status: Former Smoker -- 0.50 packs/day for 34 years    Types: Cigarettes  . Smokeless tobacco: Never Used  . Alcohol Use: No   OB History    Gravida Para Term Preterm AB TAB SAB Ectopic Multiple Living            3     Review of Systems  Constitutional: Positive for appetite change. Negative for fever.  HENT: Positive for sore throat.   Respiratory: Positive for cough and shortness of breath.   Cardiovascular: Positive for chest pain (post-tussive).  Gastrointestinal: Positive for abdominal pain (mild discomfort, resolved).  Musculoskeletal: Positive for myalgias and back pain (post-tussive).  Skin: Positive for rash.  All other systems reviewed and are negative.   Allergies  Hydrocodone-acetaminophen  Home Medications   Prior to Admission medications  Medication Sig Start Date End Date Taking? Authorizing Provider  amLODipine (NORVASC) 10 MG tablet Take 1 tablet (10 mg total) by mouth daily. 11/11/14  Yes Heath Lark, MD  EPINEPHrine 0.3 mg/0.3 mL IJ SOAJ injection Inject 0.3 mLs (0.3 mg total) into the muscle once. 09/12/14  Yes Cherene Altes, MD  polyethylene glycol [facility]) packet Take 17 g by mouth daily. Patient taking differently: Take 17 g by mouth daily as needed for mild constipation or moderate constipation.  10/01/14  Yes Ni Gorsuch, MD   BP 110/72 mmHg  Pulse 105  Temp(Src) 102 F (38.9 C) (Oral)  Resp 28  Ht 5' 1.5" (1.562 m)  Wt 123 lb (55.792 kg)  BMI 22.87 kg/m2  SpO2 93% Physical Exam  Constitutional: She is oriented to person, place, and time. She  appears well-developed and well-nourished.  HENT:  Head: Normocephalic and atraumatic.  Dry mucous membranes   Eyes: Conjunctivae are normal. Right eye exhibits no discharge. Left eye exhibits no discharge.  Neck: Neck supple.  Cardiovascular: Regular rhythm.  Tachycardia present.   tachycardia  Pulmonary/Chest: Effort normal. No respiratory distress.  Sparse rales bilaterally  Abdominal: Soft. There is no tenderness.  Musculoskeletal: She exhibits no edema.  Anterior curvature of the left tibia, no significant swelling  Neurological: She is alert and oriented to person, place, and time. Coordination normal.  Skin: Skin is warm and dry. No rash noted. She is not diaphoretic. No erythema.  Psychiatric: She has a normal mood and affect.  Nursing note and vitals reviewed.   ED Course  Procedures  DIAGNOSTIC STUDIES: Oxygen Saturation is 91% on RA, low by my interpretation.    COORDINATION OF CARE: 10:38 PM - Discussed plans to order diagnostic studies and imaging. Pt advised of plan for treatment and pt agrees.  Labs Review Labs Reviewed  COMPREHENSIVE METABOLIC PANEL - Abnormal; Notable for the following:    Sodium 134 (*)    Chloride 100 (*)    Glucose, Bld 136 (*)    BUN 31 (*)    Creatinine, Ser 1.56 (*)    Calcium 8.4 (*)    ALT 12 (*)    GFR calc non Af Amer 35 (*)    GFR calc Af Amer 41 (*)    All other components within normal limits  CBC WITH DIFFERENTIAL/PLATELET - Abnormal; Notable for the following:    WBC 11.6 (*)    Neutro Abs 8.7 (*)    Monocytes Absolute 1.7 (*)    All other components within normal limits  CULTURE, BLOOD (ROUTINE X 2)  CULTURE, BLOOD (ROUTINE X 2)  URINE CULTURE  URINALYSIS, ROUTINE W REFLEX MICROSCOPIC (NOT AT [facility])  INFLUENZA PANEL BY PCR (TYPE A & B, H1N1)  I-STAT CG4 LACTIC ACID, ED    Imaging Review Dg Chest Port 1 View  04/09/2015  CLINICAL DATA:  Acute onset of nonproductive cough, shortness of breath and central chest pain.  Right upper back pain and right-sided earache. Initial encounter. EXAM: PORTABLE CHEST 1 VIEW COMPARISON:  Chest radiograph performed 02/18/2015 FINDINGS: The lungs are well-aerated. Right upper lobe opacity is compatible with pneumonia. There is no evidence of pleural effusion or pneumothorax. The cardiomediastinal silhouette is within normal limits. A left-sided chest port is noted ending about the mid SVC. No acute osseous abnormalities are seen. IMPRESSION: Right upper lobe pneumonia noted. Electronically Signed   By: Garald Balding M.D.   On: 04/09/2015 23:19   I have personally reviewed and evaluated these  images and lab results as part of my medical decision-making.   EKG Interpretation   Date/Time:  Friday April 09 2015 21:52:58 EDT Ventricular Rate:  119 PR Interval:  163 QRS Duration: 79 QT Interval:  287 QTC Calculation: 404 R Axis:   70 Text Interpretation:  Sinus tachycardia Biatrial enlargement Anterior  infarct, old Confirmed by Colby Catanese  MD, Yaretsi Humphres ([id number]) on 04/09/2015 10:13:10  PM      MDM   Final diagnoses:  CAP (community acquired pneumonia)  Sepsis due to pneumonia [facility])   Patient presents with worsening shortness of breath cough and fever new today. Patient has rales on exam concerning for pneumonia. Borderline oxygenation. No recent hospitalization. Antibiotics and cultures. Paged hospitalist for admission. CXR reviewed, infiltrate right upper.  The patients results and plan were reviewed and discussed.   Any x-rays performed were independently reviewed by myself.   Differential diagnosis were considered with the presenting HPI.  Medications  sodium chloride 0.9 % bolus 1,000 mL (1,000 mLs Intravenous New Bag/Given 04/09/15 2333)  cefTRIAXone (ROCEPHIN) 1 g in dextrose 5 % 50 mL IVPB (not administered)  doxycycline (VIBRAMYCIN) 100 mg in dextrose 5 % 250 mL IVPB (not administered)    Filed Vitals:   04/09/15 2126 04/09/15 2327 04/09/15 2333  BP: 124/64   110/72  Pulse: 128  105  Temp: 101.8 F (38.8 C)  102 F (38.9 C)  TempSrc: Oral  Oral  Resp: 18  28  Height: 5' 1.5" (1.562 m)    Weight: 125 lb (56.7 kg) 123 lb (55.792 kg)   SpO2: 91%  93%    Final diagnoses:  CAP (community acquired pneumonia)  Sepsis due to pneumonia [facility])    Admission/ observation were discussed with the admitting physician, patient and/or family and they are comfortable with the plan.    Elnora Morrison, MD 04/09/15 2350

## 2015-04-10 ENCOUNTER — Encounter (HOSPITAL_COMMUNITY): Payer: Self-pay | Admitting: Family Medicine

## 2015-04-10 DIAGNOSIS — R6520 Severe sepsis without septic shock: Secondary | ICD-10-CM

## 2015-04-10 DIAGNOSIS — J189 Pneumonia, unspecified organism: Secondary | ICD-10-CM | POA: Insufficient documentation

## 2015-04-10 DIAGNOSIS — E871 Hypo-osmolality and hyponatremia: Secondary | ICD-10-CM | POA: Diagnosis present

## 2015-04-10 DIAGNOSIS — I10 Essential (primary) hypertension: Secondary | ICD-10-CM

## 2015-04-10 DIAGNOSIS — A419 Sepsis, unspecified organism: Secondary | ICD-10-CM | POA: Diagnosis present

## 2015-04-10 DIAGNOSIS — N179 Acute kidney failure, unspecified: Secondary | ICD-10-CM

## 2015-04-10 LAB — BASIC METABOLIC PANEL
Anion gap: 8 (ref 5–15)
BUN: 24 mg/dL — AB (ref 6–20)
CHLORIDE: 106 mmol/L (ref 101–111)
CO2: 23 mmol/L (ref 22–32)
CREATININE: 1.26 mg/dL — AB (ref 0.44–1.00)
Calcium: 7.7 mg/dL — ABNORMAL LOW (ref 8.9–10.3)
GFR calc Af Amer: 53 mL/min — ABNORMAL LOW (ref >60)
GFR calc non Af Amer: 45 mL/min — ABNORMAL LOW (ref >60)
GLUCOSE: 118 mg/dL — AB (ref 65–99)
POTASSIUM: 3.6 mmol/L (ref 3.5–5.1)
SODIUM: 137 mmol/L (ref 135–145)

## 2015-04-10 LAB — URINALYSIS, ROUTINE W REFLEX MICROSCOPIC
Bilirubin Urine: NEGATIVE
GLUCOSE, UA: NEGATIVE mg/dL
KETONES UR: NEGATIVE mg/dL
LEUKOCYTES UA: NEGATIVE
NITRITE: NEGATIVE
PH: 6 (ref 5.0–8.0)
Protein, ur: 30 mg/dL — AB
Specific Gravity, Urine: 1.02 (ref 1.005–1.030)

## 2015-04-10 LAB — APTT: APTT: 32 s (ref 24–37)

## 2015-04-10 LAB — URINE MICROSCOPIC-ADD ON

## 2015-04-10 LAB — GLUCOSE, CAPILLARY
Glucose-Capillary: 125 mg/dL — ABNORMAL HIGH (ref 65–99)
Glucose-Capillary: 91 mg/dL (ref 65–99)

## 2015-04-10 LAB — PROTIME-INR
INR: 1.04 (ref 0.00–1.49)
Prothrombin Time: 13.8 seconds (ref 11.6–15.2)

## 2015-04-10 LAB — INFLUENZA PANEL BY PCR (TYPE A & B)
H1N1FLUPCR: NOT DETECTED
INFLBPCR: NEGATIVE
Influenza A By PCR: NEGATIVE

## 2015-04-10 LAB — PROCALCITONIN: PROCALCITONIN: 4.18 ng/mL

## 2015-04-10 MED ORDER — POLYETHYLENE GLYCOL 3350 17 G PO PACK: 17.0000 g | PACK | Freq: Every day | ORAL | Status: DC | PRN

## 2015-04-10 MED ORDER — ZOLPIDEM TARTRATE 5 MG PO TABS
5.0000 mg | ORAL_TABLET | Freq: Every evening | ORAL | Status: DC | PRN
  Administered 2015-04-10 – 2015-04-12 (×3): 5 mg via ORAL
  Filled 2015-04-10 (×3): qty 1

## 2015-04-10 MED ORDER — ACETAMINOPHEN 325 MG PO TABS
650.0000 mg | ORAL_TABLET | Freq: Four times a day (QID) | ORAL | Status: DC | PRN
  Administered 2015-04-10 – 2015-04-12 (×5): 650 mg via ORAL
  Filled 2015-04-10 (×5): qty 2

## 2015-04-10 MED ORDER — DEXTROSE 5 % IV SOLN
500.0000 mg | INTRAVENOUS | Status: DC
  Administered 2015-04-11 – 2015-04-12 (×2): 500 mg via INTRAVENOUS
  Filled 2015-04-10 (×4): qty 500

## 2015-04-10 MED ORDER — BISACODYL 5 MG PO TBEC: 5.0000 mg | DELAYED_RELEASE_TABLET | Freq: Every day | ORAL | Status: DC | PRN

## 2015-04-10 MED ORDER — SODIUM CHLORIDE 0.9 % IV SOLN
INTRAVENOUS | Status: AC
  Administered 2015-04-10: 06:00:00 via INTRAVENOUS

## 2015-04-10 MED ORDER — DEXTROSE 5 % IV SOLN
1.0000 g | INTRAVENOUS | Status: DC
  Administered 2015-04-11 – 2015-04-12 (×2): 1 g via INTRAVENOUS
  Filled 2015-04-10 (×4): qty 10

## 2015-04-10 MED ORDER — HEPARIN SODIUM (PORCINE) 5000 UNIT/ML IJ SOLN
5000.0000 [IU] | Freq: Three times a day (TID) | INTRAMUSCULAR | Status: DC
  Administered 2015-04-10 – 2015-04-12 (×8): 5000 [IU] via SUBCUTANEOUS
  Filled 2015-04-10 (×9): qty 1

## 2015-04-10 MED ORDER — DEXTROSE 5 % IV SOLN
INTRAVENOUS | Status: AC
  Filled 2015-04-10: qty 500

## 2015-04-10 MED ORDER — ONDANSETRON HCL 4 MG/2ML IJ SOLN
4.0000 mg | Freq: Four times a day (QID) | INTRAMUSCULAR | Status: DC | PRN
Start: 2015-04-10 — End: 2015-04-13

## 2015-04-10 MED ORDER — ONDANSETRON HCL 4 MG PO TABS: 4.0000 mg | ORAL_TABLET | Freq: Four times a day (QID) | ORAL | Status: DC | PRN

## 2015-04-10 MED ORDER — DEXTROSE 5 % IV SOLN
500.0000 mg | Freq: Once | INTRAVENOUS | Status: AC
  Administered 2015-04-10: 500 mg via INTRAVENOUS
  Filled 2015-04-10: qty 500

## 2015-04-10 NOTE — Progress Notes (Signed)
Subjective: Patient was admitted last night ue to sepsis seconday to pneumoniaa. She is feeling slightly better. Her breathing remained laboured.  Objective: Vital signs in last 24 hours: Temp:  [98.2 F (36.8 C)-102 F (38.9 C)] 98.2 F (36.8 C) (03/18 0443) Pulse Rate:  [97-128] 97 (03/18 0443) Resp:  [18-29] 20 (03/18 0443) BP: (100-145)/(58-79) 111/58 mmHg (03/18 0443) SpO2:  [91 %-96 %] 92 % (03/18 0443) Weight:  [55.792 kg (123 lb)-56.7 kg (125 lb)] 56.518 kg (124 lb 9.6 oz) (03/18 0129) Weight change:     Intake/Output from previous day: 03/17 0701 - 03/18 0700 In: -  Out: 100 [Urine:100]  PHYSICAL EXAM General appearance: alert and mild distress Resp: diminished breath sounds bilaterally and rhonchi bilaterally Cardio: S1, S2 normal GI: soft, non-tender; bowel sounds normal; no masses,  no organomegaly Extremities: extremities normal, atraumatic, no cyanosis or edema  Lab Results:  Results for orders placed or performed during the hospital encounter of 04/09/15 (from the past 48 hour(s))  Comprehensive metabolic panel     Status: Abnormal   Collection Time: 04/09/15 11:00 PM  Result Value Ref Range   Sodium 134 (L) 135 - 145 mmol/L   Potassium 3.6 3.5 - 5.1 mmol/L   Chloride 100 (L) 101 - 111 mmol/L   CO2 23 22 - 32 mmol/L   Glucose, Bld 136 (H) 65 - 99 mg/dL   BUN 31 (H) 6 - 20 mg/dL   Creatinine, Ser 1.56 (H) 0.44 - 1.00 mg/dL   Calcium 8.4 (L) 8.9 - 10.3 mg/dL   Total Protein 7.3 6.5 - 8.1 g/dL   Albumin 3.5 3.5 - 5.0 g/dL   AST 33 15 - 41 U/L   ALT 12 (L) 14 - 54 U/L   Alkaline Phosphatase 126 38 - 126 U/L   Total Bilirubin 0.6 0.3 - 1.2 mg/dL   GFR calc non Af Amer 35 (L) >60 mL/min   GFR calc Af Amer 41 (L) >60 mL/min    Comment: (NOTE) The eGFR has been calculated using the CKD EPI equation. This calculation has not been validated in all clinical situations. eGFR's persistently <60 mL/min signify possible Chronic Kidney Disease.    Anion gap 11  5 - 15  CBC WITH DIFFERENTIAL     Status: Abnormal   Collection Time: 04/09/15 11:00 PM  Result Value Ref Range   WBC 11.6 (H) 4.0 - 10.5 K/uL   RBC 4.15 3.87 - 5.11 MIL/uL   Hemoglobin 12.7 12.0 - 15.0 g/dL   HCT 37.9 36.0 - 46.0 %   MCV 91.3 78.0 - 100.0 fL   MCH 30.6 26.0 - 34.0 pg   MCHC 33.5 30.0 - 36.0 g/dL   RDW 15.4 11.5 - 15.5 %   Platelets 190 150 - 400 K/uL   Neutrophils Relative % 74 %   Neutro Abs 8.7 (H) 1.7 - 7.7 K/uL   Lymphocytes Relative 11 %   Lymphs Abs 1.3 0.7 - 4.0 K/uL   Monocytes Relative 15 %   Monocytes Absolute 1.7 (H) 0.1 - 1.0 K/uL   Eosinophils Relative 0 %   Eosinophils Absolute 0.0 0.0 - 0.7 K/uL   Basophils Relative 0 %   Basophils Absolute 0.0 0.0 - 0.1 K/uL  Blood Culture (routine x 2)     Status: None (Preliminary result)   Collection Time: 04/09/15 11:00 PM  Result Value Ref Range   Specimen Description BLOOD LEFT ARM    Special Requests BOTTLES DRAWN AEROBIC AND ANAEROBIC [place]  Culture NO GROWTH < 12 HOURS    Report Status PENDING   Blood Culture (routine x 2)     Status: None (Preliminary result)   Collection Time: 04/09/15 11:05 PM  Result Value Ref Range   Specimen Description BLOOD LEFT ARM    Special Requests BOTTLES DRAWN AEROBIC AND ANAEROBIC 6CC EACH    Culture NO GROWTH < 12 HOURS    Report Status PENDING   I-Stat CG4 Lactic Acid, ED  (not at  [facility])     Status: None   Collection Time: 04/09/15 11:29 PM  Result Value Ref Range   Lactic Acid, Venous 1.26 0.5 - 2.0 mmol/L  Influenza panel by PCR (type A & B, H1N1)     Status: None   Collection Time: 04/09/15 11:55 PM  Result Value Ref Range   Influenza A By PCR NEGATIVE NEGATIVE   Influenza B By PCR NEGATIVE NEGATIVE   H1N1 flu by pcr NOT DETECTED NOT DETECTED    Comment:        The Xpert Flu assay (FDA approved for nasal aspirates or washes and nasopharyngeal swab specimens), is intended as an aid in the diagnosis of influenza and should not be used as a sole  basis for treatment.   Urinalysis, Routine w reflex microscopic (not at [facility])     Status: Abnormal   Collection Time: 04/10/15 12:30 AM  Result Value Ref Range   Color, Urine YELLOW YELLOW   APPearance CLEAR CLEAR   Specific Gravity, Urine 1.020 1.005 - 1.030   pH 6.0 5.0 - 8.0   Glucose, UA NEGATIVE NEGATIVE mg/dL   Hgb urine dipstick MODERATE (A) NEGATIVE   Bilirubin Urine NEGATIVE NEGATIVE   Ketones, ur NEGATIVE NEGATIVE mg/dL   Protein, ur 30 (A) NEGATIVE mg/dL   Nitrite NEGATIVE NEGATIVE   Leukocytes, UA NEGATIVE NEGATIVE  Urine microscopic-add on     Status: Abnormal   Collection Time: 04/10/15 12:30 AM  Result Value Ref Range   Squamous Epithelial / LPF 0-5 (A) NONE SEEN   WBC, UA 0-5 0 - 5 WBC/hpf   RBC / HPF 0-5 0 - 5 RBC/hpf   Bacteria, UA FEW (A) NONE SEEN  Procalcitonin     Status: None   Collection Time: 04/10/15  4:31 AM  Result Value Ref Range   Procalcitonin 4.18 ng/mL    Comment:        Interpretation: PCT > 2 ng/mL: Systemic infection (sepsis) is likely, unless other causes are known. (NOTE)         ICU PCT Algorithm               Non ICU PCT Algorithm    ----------------------------     ------------------------------         PCT < 0.25 ng/mL                 PCT < 0.1 ng/mL     Stopping of antibiotics            Stopping of antibiotics       strongly encouraged.               strongly encouraged.    ----------------------------     ------------------------------       PCT level decrease by               PCT < 0.25 ng/mL       >= 80% from peak PCT       OR PCT  0.25 - 0.5 ng/mL          Stopping of antibiotics                                             encouraged.     Stopping of antibiotics           encouraged.    ----------------------------     ------------------------------       PCT level decrease by              PCT >= 0.25 ng/mL       < 80% from peak PCT        AND PCT >= 0.5 ng/mL            Continuing antibiotics                                                encouraged.       Continuing antibiotics            encouraged.    ----------------------------     ------------------------------     PCT level increase compared          PCT > 0.5 ng/mL         with peak PCT AND          PCT >= 0.5 ng/mL             Escalation of antibiotics                                          strongly encouraged.      Escalation of antibiotics        strongly encouraged.   Protime-INR     Status: None   Collection Time: 04/10/15  4:31 AM  Result Value Ref Range   Prothrombin Time 13.8 11.6 - 15.2 seconds   INR 1.04 0.00 - 1.49  APTT     Status: None   Collection Time: 04/10/15  4:31 AM  Result Value Ref Range   aPTT 32 24 - 37 seconds  Basic metabolic panel     Status: Abnormal   Collection Time: 04/10/15  4:31 AM  Result Value Ref Range   Sodium 137 135 - 145 mmol/L   Potassium 3.6 3.5 - 5.1 mmol/L   Chloride 106 101 - 111 mmol/L   CO2 23 22 - 32 mmol/L   Glucose, Bld 118 (H) 65 - 99 mg/dL   BUN 24 (H) 6 - 20 mg/dL   Creatinine, Ser 1.26 (H) 0.44 - 1.00 mg/dL   Calcium 7.7 (L) 8.9 - 10.3 mg/dL   GFR calc non Af Amer 45 (L) >60 mL/min   GFR calc Af Amer 53 (L) >60 mL/min    Comment: (NOTE) The eGFR has been calculated using the CKD EPI equation. This calculation has not been validated in all clinical situations. eGFR's persistently <60 mL/min signify possible Chronic Kidney Disease.    Anion gap 8 5 - 15  Glucose, capillary     Status: Abnormal   Collection Time: 04/10/15  7:36 AM  Result Value Ref Range   Glucose-Capillary 125 (H)  65 - 99 mg/dL    ABGS No results for input(s): PHART, PO2ART, TCO2, HCO3 in the last 72 hours.  Invalid input(s): PCO2 CULTURES Recent Results (from the past 240 hour(s))  Blood Culture (routine x 2)     Status: None (Preliminary result)   Collection Time: 04/09/15 11:00 PM  Result Value Ref Range Status   Specimen Description BLOOD LEFT ARM  Final   Special Requests BOTTLES DRAWN AEROBIC  AND ANAEROBIC 6CC EACH  Final   Culture NO GROWTH < 12 HOURS  Final   Report Status PENDING  Incomplete  Blood Culture (routine x 2)     Status: None (Preliminary result)   Collection Time: 04/09/15 11:05 PM  Result Value Ref Range Status   Specimen Description BLOOD LEFT ARM  Final   Special Requests BOTTLES DRAWN AEROBIC AND ANAEROBIC 6CC EACH  Final   Culture NO GROWTH < 12 HOURS  Final   Report Status PENDING  Incomplete   Studies/Results: Dg Chest Port 1 View  04/09/2015  CLINICAL DATA:  Acute onset of nonproductive cough, shortness of breath and central chest pain. Right upper back pain and right-sided earache. Initial encounter. EXAM: PORTABLE CHEST 1 VIEW COMPARISON:  Chest radiograph performed 02/18/2015 FINDINGS: The lungs are well-aerated. Right upper lobe opacity is compatible with pneumonia. There is no evidence of pleural effusion or pneumothorax. The cardiomediastinal silhouette is within normal limits. A left-sided chest port is noted ending about the mid SVC. No acute osseous abnormalities are seen. IMPRESSION: Right upper lobe pneumonia noted. Electronically Signed   By: Garald Balding M.D.   On: 04/09/2015 23:19    Medications: I have reviewed the patient's current medications.  Assesment:   Principal Problem:   Severe sepsis ([place]) Active Problems:   Essential hypertension   Nicotine dependence   CAP (community acquired pneumonia)   AKI (acute kidney injury) ([place])   Hyponatremia   Sepsis due to pneumonia [facility])    Plan:  Medications reviewed Will continue iv antibiotics and iv hydration Will monitor cbc/BMP Continue regular treatment    LOS: 1 day   Aina Rossbach 04/10/2015, 11:55 AM

## 2015-04-10 NOTE — H&P (Signed)
[facility] History and Physical  Megan Sims QMG:[id number] DOB: 1955/08/21 DOA: 04/09/2015  Referring physician: ED physician PCP: Rosita Fire, MD  Specialists: Dr. Alvy Bimler (oncology), Dr. Isidore Moos (radiation oncology)   Chief Complaint: Chest and back pain  HPI: Megan Sims is a 60 y.o. female with PMH of lung cancer in remission and hypertension who presents to the ED with 1 week of dyspnea and cough, and 3 days of pain in the chest and back. Patient has history of right lung cancer for which she completed chemotherapy in November 2015 and is reportedly in remission. She had been in her usual state until the insidious development of exertional dyspnea and nonproductive cough approximately one week ago. As her symptoms progressed, patient developed pain in the chest and back which is now been present for 3 days. There is been concomitant development of subjective fevers and chills. Patient's chest and back pain is worse with cough or deep inspiration, but not necessarily with exertion. The pain is moderate in intensity and sharp in character and she is not yet attempted and any interventions for it. As her symptoms were progressing, ultimately the patient sought evaluation in the emergency department this evening.  In ED, patient was found to be febrile to 38.9 C, saturating in the low 90s on room air, tachypneic, and tachycardic to 120s with stable blood pressure. Chest x-ray was obtained and revealing of right upper lobe pneumonia. CBC features a leukocytosis to 11,600 and CMP is notable for mild hyponatremia and hypochloremia, as well as serum creatinine 1.56, up from an apparent baseline of 1. The patient was bolused with 2 L of normal saline, blood cultures were obtained, and an empiric Rocephin and doxycycline were administered for suspected CAP. Patient remained stable in the emergency department and will be admitted for ongoing evaluation and management of severe sepsis suspected  secondary to CAP.   Where does patient live?   At home     Can patient participate in ADLs?  Yes      Review of Systems:   General: no sweats, weight change, poor appetite, or fatigue. Fever, chills HEENT: no blurry vision, hearing changes or sore throat Pulm: no wheeze. Dyspnea, cough CV: no  Palpitations. Chest pain with cough Abd: no nausea, vomiting, abdominal pain, diarrhea, or constipation GU: no dysuria, hematuria, increased urinary frequency, or urgency  Ext: no leg edema Neuro: no focal weakness, numbness, or tingling, no vision change or hearing loss Skin: no rash, no wounds MSK: No muscle spasm, no deformity, no red, hot, or swollen joint Heme: No easy bruising or bleeding Travel history: No recent long distant travel    Allergy:  Allergies  Allergen Reactions  . Hydrocodone-Acetaminophen Swelling    Increases secretions and sensation of throat swelling    Past Medical History  Diagnosis Date  . Migraine   . Lung abnormality   . Hypertension   . Pleurisy   . Cerebral aneurysm 2 brain surgeries 96 or 97  . Paget disease of bone   . Diarrhea   . Renal insufficiency     Patient states " no kidney problems  . Malignant neoplasm of unknown origin ([place])   . S/P biopsy     of throat per patient.  . Oral thrush 09/24/2014  . Nicotine dependence 09/24/2014  . Chronic neck pain 09/24/2014  . Cancer [facility])     malignant neoplasm of unknown origin  . Skin ulcer ([place]) 11/18/2014    Past Surgical History  Procedure  Laterality Date  . Chest tube insertion    . Tubal ligation    . Cerebral aneurysm repair  96 or 97  . Colonoscopy      Social History:  reports that she has quit smoking. Her smoking use included Cigarettes. She has a 17 pack-year smoking history. She has never used smokeless tobacco. She reports that she does not drink alcohol or use illicit drugs.  Family History:  Family History  Problem Relation Age of Onset  . Hypertension    . Diabetes    .  Kidney disease    . Cancer Mother     throat ca  . Cancer Maternal Grandmother     thyroid ca     Prior to Admission medications   Medication Sig Start Date End Date Taking? Authorizing Provider  amLODipine (NORVASC) 10 MG tablet Take 1 tablet (10 mg total) by mouth daily. 11/11/14  Yes Heath Lark, MD  EPINEPHrine 0.3 mg/0.3 mL IJ SOAJ injection Inject 0.3 mLs (0.3 mg total) into the muscle once. 09/12/14  Yes Cherene Altes, MD  polyethylene glycol [facility]) packet Take 17 g by mouth daily. Patient taking differently: Take 17 g by mouth daily as needed for mild constipation or moderate constipation.  10/01/14  Yes Heath Lark, MD    Physical Exam: Filed Vitals:   04/10/15 0053 04/10/15 0100 04/10/15 0102 04/10/15 0129  BP:  100/63  111/64  Pulse:  102  109  Temp: 98.5 F (36.9 C)   99.5 F (37.5 C)  TempSrc: Oral   Oral  Resp: '24  24 22  '$ Height:      Weight:    56.518 kg (124 lb 9.6 oz)  SpO2:  92%  96%   General: Not in acute distress HEENT:       Eyes: PERRL, EOMI, no scleral icterus or conjunctival pallor.       ENT: No discharge from the ears or nose, no pharyngeal ulcers.        Neck: No JVD, no bruit, no appreciable mass Heme: No cervical adenopathy, no pallor Cardiac: Rate ~120 and regular, No murmurs, No gallops or rubs. Pulm: Good air movement bilaterally. Ronchi at upper lung fields, occasional wheeze. Abd: Soft, nondistended, nontender, no rebound pain or gaurding, no mass or organomegaly, BS present. Ext: No LE edema bilaterally. 2+DP/PT pulse bilaterally. Musculoskeletal: No gross deformity, no red, hot, swollen joints   Skin: No wounds on exposed surfaces, lichenified dermatitis of face and RUE Neuro: Alert, oriented X3, cranial nerves II-XII grossly intact. No focal findings Psych: Patient is not overtly psychotic, appropriate mood and affect.  Labs on Admission:  Basic Metabolic Panel:  Recent Labs Lab 04/09/15 2300  NA 134*  K 3.6  CL 100*  CO2 23   GLUCOSE 136*  BUN 31*  CREATININE 1.56*  CALCIUM 8.4*   Liver Function Tests:  Recent Labs Lab 04/09/15 2300  AST 33  ALT 12*  ALKPHOS 126  BILITOT 0.6  PROT 7.3  ALBUMIN 3.5   No results for input(s): LIPASE, AMYLASE in the last 168 hours. No results for input(s): AMMONIA in the last 168 hours. CBC:  Recent Labs Lab 04/09/15 2300  WBC 11.6*  NEUTROABS 8.7*  HGB 12.7  HCT 37.9  MCV 91.3  PLT 190   Cardiac Enzymes: No results for input(s): CKTOTAL, CKMB, CKMBINDEX, TROPONINI in the last 168 hours.  BNP (last 3 results) No results for input(s): BNP in the last 8760 hours.  ProBNP (last  3 results) No results for input(s): PROBNP in the last 8760 hours.  CBG: No results for input(s): GLUCAP in the last 168 hours.  Radiological Exams on Admission: Dg Chest Port 1 View  04/09/2015  CLINICAL DATA:  Acute onset of nonproductive cough, shortness of breath and central chest pain. Right upper back pain and right-sided earache. Initial encounter. EXAM: PORTABLE CHEST 1 VIEW COMPARISON:  Chest radiograph performed 02/18/2015 FINDINGS: The lungs are well-aerated. Right upper lobe opacity is compatible with pneumonia. There is no evidence of pleural effusion or pneumothorax. The cardiomediastinal silhouette is within normal limits. A left-sided chest port is noted ending about the mid SVC. No acute osseous abnormalities are seen. IMPRESSION: Right upper lobe pneumonia noted. Electronically Signed   By: Garald Balding M.D.   On: 04/09/2015 23:19    EKG: Independently reviewed.  Abnormal findings:  Sinus tachycardia (rate 119), anterior Q-waves   Assessment/Plan  1. Severe sepsis secondary to CAP  - Meets criteria on admission with fever, leukocytosis, tachycardia, AKI  - Treating empirically with Rocephin and azithromycin  - Follow blood cultures and tailor abx as indicated  - Trend lactate, procalcitonin  - check urine for strep pneumo antigens  - Flu PCR negative  -  Continuous pulse ox with titration of FiO2 to maintain sat >92%    2. AKI  - Presents with SCr 1.56, up from apparent baseline of 1.00  - Suspected secondary to sepsis with inadequate perfusion; ATN possible  - Anticipate resolution with IVF resuscitation  - If fails to resolve as expected, may need to extend workup   3. Hyponatremia  - Mild, in setting of dehydration  - Anticipate resolution with IVF resuscitation  - Repeat chem panel in am to assess    4. Hypertension  - At goal currently  - Hold home Norvasc for now in setting of severe sepsis with concern for progression  - Resume Norvasc as appropriate when systemic features of infection improving    5. Lung cancer  - Right lung ca, completed chemo in Nov '15   - Followed by Dr. Alvy Bimler with no recurrence  - Will see Dr. Alvy Bimler next mo and likely have port pulled if remains disease-free  - Will likely need f/u imaging of chest following treatment for CAP   DVT ppx: SQ Heparin     Code Status: Full code Family Communication: None at bed side.            Disposition Plan: Admit to inpatient   Date of Service 04/10/2015    Vianne Bulls, MD [facility] Pager [phone]  If 7PM-7AM, please contact night-coverage [email or web address] Password [email or web address] 04/10/2015, 3:34 AM

## 2015-04-10 NOTE — Progress Notes (Signed)
Pharmacy Antibiotic Note  Megan Sims is a 60 y.o. female admitted on 04/09/2015 with CAP, r/o sepsis.  Pharmacy has been consulted for rocephin and azithromycin dosing.  Plan: Cont rocephin 1 gm IV q24 hours Cont azithromycin 500 mg IV q24 hours Pharmacy will sign off as no adjustment needed in dosing Please re-consult as needed  Thank you for allowing pharmacy to be a part of this patient's care.  Excell Seltzer Poteet 04/10/2015 8:02 AM

## 2015-04-11 LAB — BASIC METABOLIC PANEL
Anion gap: 6 (ref 5–15)
BUN: 12 mg/dL (ref 6–20)
CALCIUM: 8.4 mg/dL — AB (ref 8.9–10.3)
CHLORIDE: 109 mmol/L (ref 101–111)
CO2: 25 mmol/L (ref 22–32)
CREATININE: 0.9 mg/dL (ref 0.44–1.00)
GFR calc Af Amer: >60 mL/min (ref >60)
GFR calc non Af Amer: >60 mL/min (ref >60)
GLUCOSE: 83 mg/dL (ref 65–99)
Potassium: 3.6 mmol/L (ref 3.5–5.1)
Sodium: 140 mmol/L (ref 135–145)

## 2015-04-11 LAB — CBC
HEMATOCRIT: 33.2 % — AB (ref 36.0–46.0)
HEMOGLOBIN: 11.1 g/dL — AB (ref 12.0–15.0)
MCH: 30.8 pg (ref 26.0–34.0)
MCHC: 33.4 g/dL (ref 30.0–36.0)
MCV: 92.2 fL (ref 78.0–100.0)
Platelets: 220 10*3/uL (ref 150–400)
RBC: 3.6 MIL/uL — ABNORMAL LOW (ref 3.87–5.11)
RDW: 15.9 % — AB (ref 11.5–15.5)
WBC: 7.1 10*3/uL (ref 4.0–10.5)

## 2015-04-11 LAB — GLUCOSE, CAPILLARY: Glucose-Capillary: 89 mg/dL (ref 65–99)

## 2015-04-11 NOTE — Progress Notes (Signed)
Subjective: Patient feels much better. Her breathing has improved. Her po intake is also much better. Her renal function has improved. No new complaint.  Objective: Vital signs in last 24 hours: Temp:  [97.9 F (36.6 C)-98.3 F (36.8 C)] 97.9 F (36.6 C) (03/19 0515) Pulse Rate:  [88-102] 100 (03/19 0515) Resp:  [20-22] 20 (03/19 0515) BP: (112-184)/(60-91) 184/91 mmHg (03/19 0515) SpO2:  [93 %-99 %] 99 % (03/19 0515) Weight change:     Intake/Output from previous day: 03/18 0701 - 03/19 0700 In: 480 [P.O.:480] Out: 600 [Urine:600]  PHYSICAL EXAM General appearance: alert and mild distress Resp: diminished breath sounds bilaterally and rhonchi bilaterally Cardio: S1, S2 normal GI: soft, non-tender; bowel sounds normal; no masses,  no organomegaly Extremities: extremities normal, atraumatic, no cyanosis or edema  Lab Results:  Results for orders placed or performed during the hospital encounter of 04/09/15 (from the past 48 hour(s))  Comprehensive metabolic panel     Status: Abnormal   Collection Time: 04/09/15 11:00 PM  Result Value Ref Range   Sodium 134 (L) 135 - 145 mmol/L   Potassium 3.6 3.5 - 5.1 mmol/L   Chloride 100 (L) 101 - 111 mmol/L   CO2 23 22 - 32 mmol/L   Glucose, Bld 136 (H) 65 - 99 mg/dL   BUN 31 (H) 6 - 20 mg/dL   Creatinine, Ser 1.56 (H) 0.44 - 1.00 mg/dL   Calcium 8.4 (L) 8.9 - 10.3 mg/dL   Total Protein 7.3 6.5 - 8.1 g/dL   Albumin 3.5 3.5 - 5.0 g/dL   AST 33 15 - 41 U/L   ALT 12 (L) 14 - 54 U/L   Alkaline Phosphatase 126 38 - 126 U/L   Total Bilirubin 0.6 0.3 - 1.2 mg/dL   GFR calc non Af Amer 35 (L) >60 mL/min   GFR calc Af Amer 41 (L) >60 mL/min    Comment: (NOTE) The eGFR has been calculated using the CKD EPI equation. This calculation has not been validated in all clinical situations. eGFR's persistently <60 mL/min signify possible Chronic Kidney Disease.    Anion gap 11 5 - 15  CBC WITH DIFFERENTIAL     Status: Abnormal   Collection  Time: 04/09/15 11:00 PM  Result Value Ref Range   WBC 11.6 (H) 4.0 - 10.5 K/uL   RBC 4.15 3.87 - 5.11 MIL/uL   Hemoglobin 12.7 12.0 - 15.0 g/dL   HCT 37.9 36.0 - 46.0 %   MCV 91.3 78.0 - 100.0 fL   MCH 30.6 26.0 - 34.0 pg   MCHC 33.5 30.0 - 36.0 g/dL   RDW 15.4 11.5 - 15.5 %   Platelets 190 150 - 400 K/uL   Neutrophils Relative % 74 %   Neutro Abs 8.7 (H) 1.7 - 7.7 K/uL   Lymphocytes Relative 11 %   Lymphs Abs 1.3 0.7 - 4.0 K/uL   Monocytes Relative 15 %   Monocytes Absolute 1.7 (H) 0.1 - 1.0 K/uL   Eosinophils Relative 0 %   Eosinophils Absolute 0.0 0.0 - 0.7 K/uL   Basophils Relative 0 %   Basophils Absolute 0.0 0.0 - 0.1 K/uL  Blood Culture (routine x 2)     Status: None (Preliminary result)   Collection Time: 04/09/15 11:00 PM  Result Value Ref Range   Specimen Description BLOOD LEFT ARM    Special Requests BOTTLES DRAWN AEROBIC AND ANAEROBIC 6CC EACH    Culture NO GROWTH 2 DAYS    Report Status PENDING  Blood Culture (routine x 2)     Status: None (Preliminary result)   Collection Time: 04/09/15 11:05 PM  Result Value Ref Range   Specimen Description BLOOD LEFT ARM    Special Requests BOTTLES DRAWN AEROBIC AND ANAEROBIC 6CC EACH    Culture NO GROWTH 2 DAYS    Report Status PENDING   I-Stat CG4 Lactic Acid, ED  (not at  [facility])     Status: None   Collection Time: 04/09/15 11:29 PM  Result Value Ref Range   Lactic Acid, Venous 1.26 0.5 - 2.0 mmol/L  Influenza panel by PCR (type A & B, H1N1)     Status: None   Collection Time: 04/09/15 11:55 PM  Result Value Ref Range   Influenza A By PCR NEGATIVE NEGATIVE   Influenza B By PCR NEGATIVE NEGATIVE   H1N1 flu by pcr NOT DETECTED NOT DETECTED    Comment:        The Xpert Flu assay (FDA approved for nasal aspirates or washes and nasopharyngeal swab specimens), is intended as an aid in the diagnosis of influenza and should not be used as a sole basis for treatment.   Urinalysis, Routine w reflex microscopic (not at  [facility])     Status: Abnormal   Collection Time: 04/10/15 12:30 AM  Result Value Ref Range   Color, Urine YELLOW YELLOW   APPearance CLEAR CLEAR   Specific Gravity, Urine 1.020 1.005 - 1.030   pH 6.0 5.0 - 8.0   Glucose, UA NEGATIVE NEGATIVE mg/dL   Hgb urine dipstick MODERATE (A) NEGATIVE   Bilirubin Urine NEGATIVE NEGATIVE   Ketones, ur NEGATIVE NEGATIVE mg/dL   Protein, ur 30 (A) NEGATIVE mg/dL   Nitrite NEGATIVE NEGATIVE   Leukocytes, UA NEGATIVE NEGATIVE  Urine culture     Status: None (Preliminary result)   Collection Time: 04/10/15 12:30 AM  Result Value Ref Range   Specimen Description URINE, CLEAN CATCH    Special Requests NONE    Culture      TOO YOUNG TO READ Performed at [facility]    Report Status PENDING   Urine microscopic-add on     Status: Abnormal   Collection Time: 04/10/15 12:30 AM  Result Value Ref Range   Squamous Epithelial / LPF 0-5 (A) NONE SEEN   WBC, UA 0-5 0 - 5 WBC/hpf   RBC / HPF 0-5 0 - 5 RBC/hpf   Bacteria, UA FEW (A) NONE SEEN  Procalcitonin     Status: None   Collection Time: 04/10/15  4:31 AM  Result Value Ref Range   Procalcitonin 4.18 ng/mL    Comment:        Interpretation: PCT > 2 ng/mL: Systemic infection (sepsis) is likely, unless other causes are known. (NOTE)         ICU PCT Algorithm               Non ICU PCT Algorithm    ----------------------------     ------------------------------         PCT < 0.25 ng/mL                 PCT < 0.1 ng/mL     Stopping of antibiotics            Stopping of antibiotics       strongly encouraged.               strongly encouraged.    ----------------------------     ------------------------------  PCT level decrease by               PCT < 0.25 ng/mL       >= 80% from peak PCT       OR PCT 0.25 - 0.5 ng/mL          Stopping of antibiotics                                             encouraged.     Stopping of antibiotics           encouraged.    ----------------------------      ------------------------------       PCT level decrease by              PCT >= 0.25 ng/mL       < 80% from peak PCT        AND PCT >= 0.5 ng/mL            Continuing antibiotics                                               encouraged.       Continuing antibiotics            encouraged.    ----------------------------     ------------------------------     PCT level increase compared          PCT > 0.5 ng/mL         with peak PCT AND          PCT >= 0.5 ng/mL             Escalation of antibiotics                                          strongly encouraged.      Escalation of antibiotics        strongly encouraged.   Protime-INR     Status: None   Collection Time: 04/10/15  4:31 AM  Result Value Ref Range   Prothrombin Time 13.8 11.6 - 15.2 seconds   INR 1.04 0.00 - 1.49  APTT     Status: None   Collection Time: 04/10/15  4:31 AM  Result Value Ref Range   aPTT 32 24 - 37 seconds  Basic metabolic panel     Status: Abnormal   Collection Time: 04/10/15  4:31 AM  Result Value Ref Range   Sodium 137 135 - 145 mmol/L   Potassium 3.6 3.5 - 5.1 mmol/L   Chloride 106 101 - 111 mmol/L   CO2 23 22 - 32 mmol/L   Glucose, Bld 118 (H) 65 - 99 mg/dL   BUN 24 (H) 6 - 20 mg/dL   Creatinine, Ser 1.26 (H) 0.44 - 1.00 mg/dL   Calcium 7.7 (L) 8.9 - 10.3 mg/dL   GFR calc non Af Amer 45 (L) >60 mL/min   GFR calc Af Amer 53 (L) >60 mL/min    Comment: (NOTE) The eGFR has been calculated using the CKD EPI equation. This calculation has not been validated in all clinical situations. eGFR's persistently <60 mL/min  signify possible Chronic Kidney Disease.    Anion gap 8 5 - 15  Glucose, capillary     Status: Abnormal   Collection Time: 04/10/15  7:36 AM  Result Value Ref Range   Glucose-Capillary 125 (H) 65 - 99 mg/dL  Glucose, capillary     Status: None   Collection Time: 04/10/15  8:36 PM  Result Value Ref Range   Glucose-Capillary 91 65 - 99 mg/dL   Comment 1 Notify RN    Comment 2  Document in Chart   CBC     Status: Abnormal   Collection Time: 04/11/15  6:12 AM  Result Value Ref Range   WBC 7.1 4.0 - 10.5 K/uL   RBC 3.60 (L) 3.87 - 5.11 MIL/uL   Hemoglobin 11.1 (L) 12.0 - 15.0 g/dL   HCT 33.2 (L) 36.0 - 46.0 %   MCV 92.2 78.0 - 100.0 fL   MCH 30.8 26.0 - 34.0 pg   MCHC 33.4 30.0 - 36.0 g/dL   RDW 15.9 (H) 11.5 - 15.5 %   Platelets 220 150 - 400 K/uL  Basic metabolic panel     Status: Abnormal   Collection Time: 04/11/15  6:12 AM  Result Value Ref Range   Sodium 140 135 - 145 mmol/L   Potassium 3.6 3.5 - 5.1 mmol/L   Chloride 109 101 - 111 mmol/L   CO2 25 22 - 32 mmol/L   Glucose, Bld 83 65 - 99 mg/dL   BUN 12 6 - 20 mg/dL   Creatinine, Ser 0.90 0.44 - 1.00 mg/dL   Calcium 8.4 (L) 8.9 - 10.3 mg/dL   GFR calc non Af Amer >60 >60 mL/min   GFR calc Af Amer >60 >60 mL/min    Comment: (NOTE) The eGFR has been calculated using the CKD EPI equation. This calculation has not been validated in all clinical situations. eGFR's persistently <60 mL/min signify possible Chronic Kidney Disease.    Anion gap 6 5 - 15  Glucose, capillary     Status: None   Collection Time: 04/11/15  7:23 AM  Result Value Ref Range   Glucose-Capillary 89 65 - 99 mg/dL    ABGS No results for input(s): PHART, PO2ART, TCO2, HCO3 in the last 72 hours.  Invalid input(s): PCO2 CULTURES Recent Results (from the past 240 hour(s))  Blood Culture (routine x 2)     Status: None (Preliminary result)   Collection Time: 04/09/15 11:00 PM  Result Value Ref Range Status   Specimen Description BLOOD LEFT ARM  Final   Special Requests BOTTLES DRAWN AEROBIC AND ANAEROBIC 6CC EACH  Final   Culture NO GROWTH 2 DAYS  Final   Report Status PENDING  Incomplete  Blood Culture (routine x 2)     Status: None (Preliminary result)   Collection Time: 04/09/15 11:05 PM  Result Value Ref Range Status   Specimen Description BLOOD LEFT ARM  Final   Special Requests BOTTLES DRAWN AEROBIC AND ANAEROBIC 6CC  EACH  Final   Culture NO GROWTH 2 DAYS  Final   Report Status PENDING  Incomplete  Urine culture     Status: None (Preliminary result)   Collection Time: 04/10/15 12:30 AM  Result Value Ref Range Status   Specimen Description URINE, CLEAN CATCH  Final   Special Requests NONE  Final   Culture   Final    TOO YOUNG TO READ Performed at [facility]    Report Status PENDING  Incomplete   Studies/Results: Dg  Chest Port 1 View  04/09/2015  CLINICAL DATA:  Acute onset of nonproductive cough, shortness of breath and central chest pain. Right upper back pain and right-sided earache. Initial encounter. EXAM: PORTABLE CHEST 1 VIEW COMPARISON:  Chest radiograph performed 02/18/2015 FINDINGS: The lungs are well-aerated. Right upper lobe opacity is compatible with pneumonia. There is no evidence of pleural effusion or pneumothorax. The cardiomediastinal silhouette is within normal limits. A left-sided chest port is noted ending about the mid SVC. No acute osseous abnormalities are seen. IMPRESSION: Right upper lobe pneumonia noted. Electronically Signed   By: Garald Balding M.D.   On: 04/09/2015 23:19    Medications: I have reviewed the patient's current medications.  Assesment:   Principal Problem:   Severe sepsis ([place]) Active Problems:   Essential hypertension   Nicotine dependence   CAP (community acquired pneumonia)   AKI (acute kidney injury) ([place])   Hyponatremia   Sepsis due to pneumonia [facility])    Plan:  Medications reviewed Will continue iv antibiotics  Advised patient to ambulate and walk Continue regular treatment    LOS: 2 days   Megan Sims 04/11/2015, 1:56 PM

## 2015-04-12 ENCOUNTER — Inpatient Hospital Stay (HOSPITAL_COMMUNITY): Payer: Medicaid Other

## 2015-04-12 LAB — URINE CULTURE: Culture: >=100000

## 2015-04-12 LAB — GLUCOSE, CAPILLARY: Glucose-Capillary: 79 mg/dL (ref 65–99)

## 2015-04-12 NOTE — Progress Notes (Signed)
Subjective: Complains of right side chest pain on deep breath. He cough and breathing is improving. No fever or chills.  Objective: Vital signs in last 24 hours: Temp:  [97.2 F (36.2 C)-98.6 F (37 C)] 98.1 F (36.7 C) (03/20 0506) Pulse Rate:  [88-97] 88 (03/20 0506) Resp:  [20] 20 (03/20 0506) BP: (123-167)/(79-105) 144/79 mmHg (03/20 0506) SpO2:  [97 %-99 %] 97 % (03/20 0506) Weight change:     Intake/Output from previous day: 03/19 0701 - 03/20 0700 In: 716 [P.O.:716] Out: 600 [Urine:600]  PHYSICAL EXAM General appearance: alert and mild distress Resp: diminished breath sounds bilaterally and rhonchi bilaterally Cardio: S1, S2 normal GI: soft, non-tender; bowel sounds normal; no masses,  no organomegaly Extremities: extremities normal, atraumatic, no cyanosis or edema  Lab Results:  Results for orders placed or performed during the hospital encounter of 04/09/15 (from the past 48 hour(s))  Glucose, capillary     Status: None   Collection Time: 04/10/15  8:36 PM  Result Value Ref Range   Glucose-Capillary 91 65 - 99 mg/dL   Comment 1 Notify RN    Comment 2 Document in Chart   CBC     Status: Abnormal   Collection Time: 04/11/15  6:12 AM  Result Value Ref Range   WBC 7.1 4.0 - 10.5 K/uL   RBC 3.60 (L) 3.87 - 5.11 MIL/uL   Hemoglobin 11.1 (L) 12.0 - 15.0 g/dL   HCT 33.2 (L) 36.0 - 46.0 %   MCV 92.2 78.0 - 100.0 fL   MCH 30.8 26.0 - 34.0 pg   MCHC 33.4 30.0 - 36.0 g/dL   RDW 15.9 (H) 11.5 - 15.5 %   Platelets 220 150 - 400 K/uL  Basic metabolic panel     Status: Abnormal   Collection Time: 04/11/15  6:12 AM  Result Value Ref Range   Sodium 140 135 - 145 mmol/L   Potassium 3.6 3.5 - 5.1 mmol/L   Chloride 109 101 - 111 mmol/L   CO2 25 22 - 32 mmol/L   Glucose, Bld 83 65 - 99 mg/dL   BUN 12 6 - 20 mg/dL   Creatinine, Ser 0.90 0.44 - 1.00 mg/dL   Calcium 8.4 (L) 8.9 - 10.3 mg/dL   GFR calc non Af Amer >60 >60 mL/min   GFR calc Af Amer >60 >60 mL/min   Comment: (NOTE) The eGFR has been calculated using the CKD EPI equation. This calculation has not been validated in all clinical situations. eGFR's persistently <60 mL/min signify possible Chronic Kidney Disease.    Anion gap 6 5 - 15  Glucose, capillary     Status: None   Collection Time: 04/11/15  7:23 AM  Result Value Ref Range   Glucose-Capillary 89 65 - 99 mg/dL    ABGS No results for input(s): PHART, PO2ART, TCO2, HCO3 in the last 72 hours.  Invalid input(s): PCO2 CULTURES Recent Results (from the past 240 hour(s))  Blood Culture (routine x 2)     Status: None (Preliminary result)   Collection Time: 04/09/15 11:00 PM  Result Value Ref Range Status   Specimen Description BLOOD LEFT ARM  Final   Special Requests BOTTLES DRAWN AEROBIC AND ANAEROBIC 6CC EACH  Final   Culture NO GROWTH 2 DAYS  Final   Report Status PENDING  Incomplete  Blood Culture (routine x 2)     Status: None (Preliminary result)   Collection Time: 04/09/15 11:05 PM  Result Value Ref Range Status   Specimen  Description BLOOD LEFT ARM  Final   Special Requests BOTTLES DRAWN AEROBIC AND ANAEROBIC 6CC EACH  Final   Culture NO GROWTH 2 DAYS  Final   Report Status PENDING  Incomplete  Urine culture     Status: None (Preliminary result)   Collection Time: 04/10/15 12:30 AM  Result Value Ref Range Status   Specimen Description URINE, CLEAN CATCH  Final   Special Requests NONE  Final   Culture   Final    TOO YOUNG TO READ Performed at [facility]    Report Status PENDING  Incomplete   Studies/Results: No results found.  Medications: I have reviewed the patient's current medications.  Assesment:   Principal Problem:   Severe sepsis ([place]) Active Problems:   Essential hypertension   Nicotine dependence   CAP (community acquired pneumonia)   AKI (acute kidney injury) ([place])   Hyponatremia   Sepsis due to pneumonia [facility])    Plan:  Medications reviewed Will continue iv antibiotics Repeat  chest x-ray Continue regular treatment    LOS: 3 days   Desman Polak 04/12/2015, 8:10 AM

## 2015-04-13 LAB — GLUCOSE, CAPILLARY: Glucose-Capillary: 87 mg/dL (ref 65–99)

## 2015-04-13 MED ORDER — HEPARIN SOD (PORK) LOCK FLUSH 100 UNIT/ML IV SOLN
500.0000 [IU] | INTRAVENOUS | Status: AC | PRN
  Administered 2015-04-13: 500 [IU]
  Filled 2015-04-13: qty 5

## 2015-04-13 MED ORDER — AMOXICILLIN-POT CLAVULANATE 500-125 MG PO TABS: 1.0000 | ORAL_TABLET | Freq: Three times a day (TID) | ORAL | Status: DC

## 2015-04-13 MED ORDER — DOXYCYCLINE HYCLATE 50 MG PO CAPS: 50.0000 mg | ORAL_CAPSULE | Freq: Two times a day (BID) | ORAL | Status: DC

## 2015-04-13 NOTE — Progress Notes (Signed)
Discharge instructions given on medications,and follow up visits,patient verbalized understanding.Prescriptions sent with patient.Accompanied by staff to an awaiting vehicle. 

## 2015-04-13 NOTE — Care Management Note (Signed)
Case Management Note  Patient Details  Name: Megan Sims MRN: [id number] Date of Birth: 1955/01/28  Subjective/Objective:                  Pt admitted with CAP. Pt is from home, ind with ADL's. Pt plans to return home today.   Action/Plan: No CM needs.   Expected Discharge Date:       04/13/2015           Expected Discharge Plan:  Home/Self Care  In-House Referral:  NA  Discharge planning Services  CM Consult  Post Acute Care Choice:  NA Choice offered to:  NA  DME Arranged:    DME Agency:     HH Arranged:    HH Agency:     Status of Service:  Completed, signed off  Medicare Important Message Given:    Date Medicare IM Given:    Medicare IM give by:    Date Additional Medicare IM Given:    Additional Medicare Important Message give by:     If discussed at [place] of Stay Meetings, dates discussed:    Additional Comments:  Sherald Barge, RN 04/13/2015, 11:03 AM

## 2015-04-14 LAB — CULTURE, BLOOD (ROUTINE X 2)
CULTURE: NO GROWTH
CULTURE: NO GROWTH

## 2015-04-23 NOTE — Discharge Summary (Signed)
Physician Discharge Summary  Patient ID: PANDORA MCCRACKIN MRN: [id number] DOB/AGE: 1955/07/03 60 y.o. Primary Care Physician:Liam Cammarata, MD Admit date: 04/09/2015 Discharge date: 04/23/2015    Discharge Diagnoses:  Principal Problem:   Severe sepsis [facility]) Active Problems:   Essential hypertension   Nicotine dependence   CAP (community acquired pneumonia)   AKI (acute kidney injury) ([place])   Hyponatremia   Sepsis due to pneumonia ([place])     Medication List    TAKE these medications        amLODipine 10 MG tablet  Commonly known as:  NORVASC  Take 1 tablet (10 mg total) by mouth daily.     amoxicillin-clavulanate 500-125 MG tablet  Commonly known as:  AUGMENTIN  Take 1 tablet (500 mg total) by mouth 3 (three) times daily.     doxycycline 50 MG capsule  Commonly known as:  VIBRAMYCIN  Take 1 capsule (50 mg total) by mouth 2 (two) times daily.     EPINEPHrine 0.3 mg/0.3 mL Soaj injection  Commonly known as:  EPI-PEN  Inject 0.3 mLs (0.3 mg total) into the muscle once.     polyethylene glycol packet  Commonly known as:  MIRALAX  Take 17 g by mouth daily.        Discharged Condition: improved    Consults:None  Significant Diagnostic Studies: Dg Chest 1 View  04/12/2015  CLINICAL DATA:  Right-sided chest and back pain, history of stage III lung malignancy EXAM: CHEST 1 VIEW COMPARISON:  Portable chest x-ray of April 09, 2015 FINDINGS: The lungs are well-expanded. There is persistent increased interstitial density in the right mid and lower lung which is somewhat less conspicuous today. Subtle density at the left lung base is compatible with a nipple shadow. The heart is normal in size. The pulmonary vascularity is not engorged. The power port appliance tip overlies the proximal SVC. There is radiodensity that projects over the left upper hemithorax that is like external to the patient. The bony thorax exhibits no acute abnormality. IMPRESSION: Slight interval improvement in  the interstitial infiltrate in the right mid and lower lung. There is no pneumothorax or pleural effusion. Electronically Signed   By: David  Martinique M.D.   On: 04/12/2015 09:10   Dg Chest Port 1 View  04/09/2015  CLINICAL DATA:  Acute onset of nonproductive cough, shortness of breath and central chest pain. Right upper back pain and right-sided earache. Initial encounter. EXAM: PORTABLE CHEST 1 VIEW COMPARISON:  Chest radiograph performed 02/18/2015 FINDINGS: The lungs are well-aerated. Right upper lobe opacity is compatible with pneumonia. There is no evidence of pleural effusion or pneumothorax. The cardiomediastinal silhouette is within normal limits. A left-sided chest port is noted ending about the mid SVC. No acute osseous abnormalities are seen. IMPRESSION: Right upper lobe pneumonia noted. Electronically Signed   By: Garald Balding M.D.   On: 04/09/2015 23:19    Lab Results: Basic Metabolic Panel: No results for input(s): NA, K, CL, CO2, GLUCOSE, BUN, CREATININE, CALCIUM, MG, PHOS in the last 72 hours. Liver Function Tests: No results for input(s): AST, ALT, ALKPHOS, BILITOT, PROT, ALBUMIN in the last 72 hours.   CBC: No results for input(s): WBC, NEUTROABS, HGB, HCT, MCV, PLT in the last 72 hours.  No results found for this or any previous visit (from the past 240 hour(s)).   Hospital Course:  This is a 60 years old female with history of multiple medical illnesses was admitted due to sepsis secondary to pneumonia. Patient recently completed  treatment for lung cancer. She treated with combinations of IV antibiotics and also received IV fluid. Over the hospital stay her renal function improved and her sepsis/pneumonia controlled. Patient discharged on oral antibiotics in stable condition.  Discharge Exam: Blood pressure 145/92, pulse 89, temperature 98.2 F (36.8 C), temperature source Oral, resp. rate 20, height 5' 1.5" (1.562 m), weight 56.518 kg (124 lb 9.6 oz), SpO2 100  %.   Disposition: Home        Follow-up Information    Follow up with [facility], MD On 04/20/2015.   Specialty:  Internal Medicine   Why:  @ 10:00   Contact information:   [place] [address] [phone]       Signed: Rosita Fire   04/23/2015, 11:51 AM

## 2015-04-28 ENCOUNTER — Ambulatory Visit (HOSPITAL_COMMUNITY): Payer: Medicaid Other

## 2015-04-28 ENCOUNTER — Telehealth: Payer: Self-pay | Admitting: Hematology and Oncology

## 2015-04-28 ENCOUNTER — Telehealth: Payer: Self-pay | Admitting: *Deleted

## 2015-04-28 ENCOUNTER — Other Ambulatory Visit: Payer: Self-pay | Admitting: Hematology and Oncology

## 2015-04-28 NOTE — Telephone Encounter (Signed)
Appointments have been moved by Scheduler and pt was notified.  Pt confirmed her lab/flush/ Ct on Friday and Dr. Alvy Bimler for 4/12.   Appt note for CT says it is approved.

## 2015-04-28 NOTE — Telephone Encounter (Signed)
-----   Message from Heath Lark, MD sent at 04/28/2015  2:32 PM EDT ----- Regarding: RE: CT rescheduled I placed urgent POF to move her labs & Flush to be done before CT and see me next week Please call Linda/CT to confirm her CT is approved ----- Message -----    From: Cathlean Cower, RN    Sent: 04/28/2015   2:21 PM      To: Heath Lark, MD Subject: CT rescheduled                                 CT r/s to 4/7 for some reason.  Pt scheduled to see you tomorrow 4/6

## 2015-04-28 NOTE — Telephone Encounter (Signed)
s.w. pt and on adjust appts....pt is aware of 4.7 and 4.12 appts

## 2015-04-29 ENCOUNTER — Other Ambulatory Visit: Payer: Medicaid Other

## 2015-04-29 ENCOUNTER — Ambulatory Visit: Payer: Medicaid Other | Admitting: Hematology and Oncology

## 2015-04-30 ENCOUNTER — Encounter (HOSPITAL_COMMUNITY): Payer: Self-pay

## 2015-04-30 ENCOUNTER — Ambulatory Visit (HOSPITAL_COMMUNITY)
Admission: RE | Admit: 2015-04-30 | Discharge: 2015-04-30 | Disposition: A | Discharge status: Home / Self Care | Payer: Medicaid Other | Source: Ambulatory Visit | Attending: Hematology and Oncology | Admitting: Hematology and Oncology

## 2015-04-30 ENCOUNTER — Other Ambulatory Visit (HOSPITAL_BASED_OUTPATIENT_CLINIC_OR_DEPARTMENT_OTHER): Payer: Medicaid Other

## 2015-04-30 ENCOUNTER — Ambulatory Visit (HOSPITAL_BASED_OUTPATIENT_CLINIC_OR_DEPARTMENT_OTHER): Payer: Medicaid Other

## 2015-04-30 ENCOUNTER — Other Ambulatory Visit: Payer: Self-pay | Admitting: Hematology and Oncology

## 2015-04-30 VITALS — BP 147/77 | HR 101 | Temp 97.9°F | Resp 19

## 2015-04-30 DIAGNOSIS — I700 Atherosclerosis of aorta: Secondary | ICD-10-CM | POA: Insufficient documentation

## 2015-04-30 DIAGNOSIS — C3492 Malignant neoplasm of unspecified part of left bronchus or lung: Principal | ICD-10-CM

## 2015-04-30 DIAGNOSIS — R918 Other nonspecific abnormal finding of lung field: Secondary | ICD-10-CM | POA: Insufficient documentation

## 2015-04-30 DIAGNOSIS — M881 Osteitis deformans of vertebrae: Secondary | ICD-10-CM | POA: Diagnosis not present

## 2015-04-30 DIAGNOSIS — I2510 Atherosclerotic heart disease of native coronary artery without angina pectoris: Secondary | ICD-10-CM | POA: Insufficient documentation

## 2015-04-30 DIAGNOSIS — C3491 Malignant neoplasm of unspecified part of right bronchus or lung: Secondary | ICD-10-CM

## 2015-04-30 LAB — CBC WITH DIFFERENTIAL/PLATELET
BASO%: 0.1 % (ref 0.0–2.0)
Basophils Absolute: 0 10*3/uL (ref 0.0–0.1)
EOS%: 1.5 % (ref 0.0–7.0)
Eosinophils Absolute: 0.1 10*3/uL (ref 0.0–0.5)
HEMATOCRIT: 36.7 % (ref 34.8–46.6)
HEMOGLOBIN: 12.3 g/dL (ref 11.6–15.9)
LYMPH#: 1 10*3/uL (ref 0.9–3.3)
LYMPH%: 13.7 % — ABNORMAL LOW (ref 14.0–49.7)
MCH: 30.5 pg (ref 25.1–34.0)
MCHC: 33.5 g/dL (ref 31.5–36.0)
MCV: 91.1 fL (ref 79.5–101.0)
MONO#: 0.8 10*3/uL (ref 0.1–0.9)
MONO%: 10.8 % (ref 0.0–14.0)
NEUT%: 73.9 % (ref 38.4–76.8)
NEUTROS ABS: 5.4 10*3/uL (ref 1.5–6.5)
Platelets: 233 10*3/uL (ref 145–400)
RBC: 4.03 10*6/uL (ref 3.70–5.45)
RDW: 16.9 % — AB (ref 11.2–14.5)
WBC: 7.3 10*3/uL (ref 3.9–10.3)

## 2015-04-30 LAB — COMPREHENSIVE METABOLIC PANEL
ALBUMIN: 3.2 g/dL — AB (ref 3.5–5.0)
ALK PHOS: 170 U/L — AB (ref 40–150)
ALT: 11 U/L (ref 0–55)
AST: 39 U/L — ABNORMAL HIGH (ref 5–34)
Anion Gap: 7 mEq/L (ref 3–11)
BILIRUBIN TOTAL: 0.32 mg/dL (ref 0.20–1.20)
BUN: 16.6 mg/dL (ref 7.0–26.0)
CALCIUM: 9.2 mg/dL (ref 8.4–10.4)
CO2: 26 mEq/L (ref 22–29)
Chloride: 106 mEq/L (ref 98–109)
Creatinine: 0.9 mg/dL (ref 0.6–1.1)
EGFR: 76 mL/min/{1.73_m2} — AB (ref >90)
Glucose: 105 mg/dl (ref 70–140)
POTASSIUM: 3.7 meq/L (ref 3.5–5.1)
Sodium: 139 mEq/L (ref 136–145)
TOTAL PROTEIN: 7.1 g/dL (ref 6.4–8.3)

## 2015-04-30 MED ORDER — SODIUM CHLORIDE 0.9% FLUSH
10.0000 mL | Freq: Once | INTRAVENOUS | Status: AC
Start: 2015-04-30 — End: 2015-04-30
  Administered 2015-04-30: 10 mL via INTRAVENOUS
  Filled 2015-04-30: qty 10

## 2015-05-03 ENCOUNTER — Emergency Department (HOSPITAL_COMMUNITY): Payer: Medicaid Other

## 2015-05-03 ENCOUNTER — Other Ambulatory Visit: Payer: Self-pay

## 2015-05-03 ENCOUNTER — Encounter (HOSPITAL_COMMUNITY): Payer: Self-pay | Admitting: *Deleted

## 2015-05-03 ENCOUNTER — Emergency Department (HOSPITAL_COMMUNITY)
Admission: EM | Admit: 2015-05-03 | Discharge: 2015-05-04 | Disposition: A | Discharge status: Home / Self Care | Payer: Medicaid Other | Attending: Emergency Medicine | Admitting: Emergency Medicine

## 2015-05-03 DIAGNOSIS — I10 Essential (primary) hypertension: Secondary | ICD-10-CM | POA: Diagnosis not present

## 2015-05-03 DIAGNOSIS — Z87891 Personal history of nicotine dependence: Secondary | ICD-10-CM | POA: Diagnosis not present

## 2015-05-03 DIAGNOSIS — Z791 Long term (current) use of non-steroidal anti-inflammatories (NSAID): Secondary | ICD-10-CM | POA: Diagnosis not present

## 2015-05-03 DIAGNOSIS — R05 Cough: Secondary | ICD-10-CM | POA: Diagnosis present

## 2015-05-03 DIAGNOSIS — R0600 Dyspnea, unspecified: Secondary | ICD-10-CM | POA: Diagnosis not present

## 2015-05-03 DIAGNOSIS — Z79899 Other long term (current) drug therapy: Secondary | ICD-10-CM | POA: Diagnosis not present

## 2015-05-03 LAB — BASIC METABOLIC PANEL
Anion gap: 9 (ref 5–15)
BUN: 16 mg/dL (ref 6–20)
CALCIUM: 9.2 mg/dL (ref 8.9–10.3)
CHLORIDE: 105 mmol/L (ref 101–111)
CO2: 27 mmol/L (ref 22–32)
CREATININE: 1.02 mg/dL — AB (ref 0.44–1.00)
GFR calc non Af Amer: 59 mL/min — ABNORMAL LOW (ref >60)
Glucose, Bld: 120 mg/dL — ABNORMAL HIGH (ref 65–99)
POTASSIUM: 3.3 mmol/L — AB (ref 3.5–5.1)
Sodium: 141 mmol/L (ref 135–145)

## 2015-05-03 LAB — CBC WITH DIFFERENTIAL/PLATELET
BASOS ABS: 0 10*3/uL (ref 0.0–0.1)
BASOS PCT: 0 %
EOS ABS: 0.2 10*3/uL (ref 0.0–0.7)
Eosinophils Relative: 4 %
HEMATOCRIT: 37.4 % (ref 36.0–46.0)
HEMOGLOBIN: 12.6 g/dL (ref 12.0–15.0)
Lymphocytes Relative: 21 %
Lymphs Abs: 1.2 10*3/uL (ref 0.7–4.0)
MCH: 30.7 pg (ref 26.0–34.0)
MCHC: 33.7 g/dL (ref 30.0–36.0)
MCV: 91 fL (ref 78.0–100.0)
Monocytes Absolute: 0.6 10*3/uL (ref 0.1–1.0)
Monocytes Relative: 10 %
NEUTROS ABS: 3.7 10*3/uL (ref 1.7–7.7)
NEUTROS PCT: 65 %
Platelets: 234 10*3/uL (ref 150–400)
RBC: 4.11 MIL/uL (ref 3.87–5.11)
RDW: 17.1 % — ABNORMAL HIGH (ref 11.5–15.5)
WBC: 5.7 10*3/uL (ref 4.0–10.5)

## 2015-05-03 LAB — TROPONIN I: TROPONIN I: 0.03 ng/mL (ref <0.031)

## 2015-05-03 MED ORDER — IPRATROPIUM-ALBUTEROL 0.5-2.5 (3) MG/3ML IN SOLN
3.0000 mL | Freq: Once | RESPIRATORY_TRACT | Status: AC
  Administered 2015-05-03: 3 mL via RESPIRATORY_TRACT
  Filled 2015-05-03: qty 3

## 2015-05-03 MED ORDER — IOPAMIDOL (ISOVUE-370) INJECTION 76%
100.0000 mL | Freq: Once | INTRAVENOUS | Status: AC | PRN
  Administered 2015-05-03: 100 mL via INTRAVENOUS

## 2015-05-03 MED ORDER — ALBUTEROL SULFATE HFA 108 (90 BASE) MCG/ACT IN AERS
2.0000 | INHALATION_SPRAY | Freq: Once | RESPIRATORY_TRACT | Status: AC
  Administered 2015-05-03: 2 via RESPIRATORY_TRACT
  Filled 2015-05-03: qty 6.7

## 2015-05-03 MED ORDER — HYDROCOD POLST-CPM POLST ER 10-8 MG/5ML PO SUER: 5.0000 mL | Freq: Two times a day (BID) | ORAL | Status: DC | PRN

## 2015-05-03 NOTE — ED Provider Notes (Signed)
CSN: [id number]     Arrival date & time 05/03/15  1740 History   First MD Initiated Contact with Patient 05/03/15 1812     Chief Complaint  Patient presents with  . Cough     (Consider location/radiation/quality/duration/timing/severity/associated sxs/prior Treatment) HPI...... patient with a history of lung cancer and recent admission for community-acquired pneumonia presents with persistent dyspnea especially with exertion. No substernal chest pain. No fever, sweats, chills, productive cough. Exertion makes symptoms worse. Patient has a return visit to the oncologist scheduled this week.  Past Medical History  Diagnosis Date  . Migraine   . Lung abnormality   . Hypertension   . Pleurisy   . Cerebral aneurysm 2 brain surgeries 96 or 97  . Paget disease of bone   . Diarrhea   . Renal insufficiency     Patient states " no kidney problems  . Malignant neoplasm of unknown origin ([place])   . S/P biopsy     of throat per patient.  . Oral thrush 09/24/2014  . Nicotine dependence 09/24/2014  . Chronic neck pain 09/24/2014  . Cancer [facility])     malignant neoplasm of unknown origin  . Skin ulcer ([place]) 11/18/2014   Past Surgical History  Procedure Laterality Date  . Chest tube insertion    . Tubal ligation    . Cerebral aneurysm repair  96 or 97  . Colonoscopy     Family History  Problem Relation Age of Onset  . Hypertension    . Diabetes    . Kidney disease    . Cancer Mother     throat ca  . Cancer Maternal Grandmother     thyroid ca   Social History  Substance Use Topics  . Smoking status: Former Smoker -- 0.50 packs/day for 34 years    Types: Cigarettes  . Smokeless tobacco: Never Used  . Alcohol Use: No   OB History    Gravida Para Term Preterm AB TAB SAB Ectopic Multiple Living            3     Review of Systems    Allergies  Review of patient's allergies indicates no known allergies.  Home Medications   Prior to Admission medications   Medication Sig Start  Date End Date Taking? Authorizing Provider  amLODipine (NORVASC) 10 MG tablet Take 1 tablet (10 mg total) by mouth daily. 11/11/14  Yes Heath Lark, MD  EPINEPHrine 0.3 mg/0.3 mL IJ SOAJ injection Inject 0.3 mLs (0.3 mg total) into the muscle once. 09/12/14  Yes Cherene Altes, MD  ibuprofen (ADVIL,MOTRIN) 200 MG tablet Take 200 mg by mouth 2 (two) times daily as needed for mild pain or moderate pain.   Yes Historical Provider, MD  amoxicillin-clavulanate (AUGMENTIN) 500-125 MG tablet Take 1 tablet (500 mg total) by mouth 3 (three) times daily. Patient not taking: Reported on 05/03/2015 04/13/15   Rosita Fire, MD  chlorpheniramine-HYDROcodone [facility] PENNKINETIC ER) 10-8 MG/5ML SUER Take 5 mLs by mouth every 12 (twelve) hours as needed for cough. 05/03/15   Nat Christen, MD  doxycycline (VIBRAMYCIN) 50 MG capsule Take 1 capsule (50 mg total) by mouth 2 (two) times daily. Patient not taking: Reported on 05/03/2015 04/13/15   Rosita Fire, MD   BP 117/78 mmHg  Pulse 76  Temp(Src) 98.4 F (36.9 C) (Oral)  Resp 16  Ht '5\' 1"'$  (1.549 m)  Wt 120 lb (54.432 kg)  BMI 22.69 kg/m2  SpO2 100% Physical Exam  Constitutional: She is  oriented to person, place, and time. She appears well-developed and well-nourished.  HENT:  Head: Normocephalic and atraumatic.  Eyes: Conjunctivae and EOM are normal. Pupils are equal, round, and reactive to light.  Neck: Normal range of motion. Neck supple.  Cardiovascular: Normal rate and regular rhythm.   Pulmonary/Chest: Effort normal and breath sounds normal.  Abdominal: Soft. Bowel sounds are normal.  Musculoskeletal: Normal range of motion.  Neurological: She is alert and oriented to person, place, and time.  Skin: Skin is warm and dry.  Psychiatric: She has a normal mood and affect. Her behavior is normal.  Nursing note and vitals reviewed.   ED Course  Procedures (including critical care time) Labs Review Labs Reviewed  BASIC METABOLIC PANEL - Abnormal;  Notable for the following:    Potassium 3.3 (*)    Glucose, Bld 120 (*)    Creatinine, Ser 1.02 (*)    GFR calc non Af Amer 59 (*)    All other components within normal limits  CBC WITH DIFFERENTIAL/PLATELET - Abnormal; Notable for the following:    RDW 17.1 (*)    All other components within normal limits  TROPONIN I    Imaging Review Dg Chest 2 View  05/03/2015  CLINICAL DATA:  Cough for 1 week. Recent pneumonia. History of stage III lung carcinoma EXAM: CHEST  2 VIEW COMPARISON:  April 14, 2015 chest radiograph and chest CT April 30, 2015 FINDINGS: There has been partial but incomplete clearing of infiltrate from the anterior segment right upper lobe. Lungs elsewhere clear. Heart size and pulmonary vascularity are normal. No adenopathy. Port-A-Cath tip is in the superior vena cava. No pneumothorax. There is degenerative change in the thoracic spine. There is atherosclerotic calcification in the aorta. IMPRESSION: Partial but incomplete clearing of anterior segment right upper lobe infiltrate. No new opacity. Stable cardiac silhouette. No adenopathy evident. Electronically Signed   By: Lowella Grip III M.D.   On: 05/03/2015 18:54   Ct Angio Chest Pe W/cm &/or Wo Cm  05/03/2015  CLINICAL DATA:  Cough, fatigue and shortness of breath for 1 week. History of lung cancer. EXAM: CT ANGIOGRAPHY CHEST WITH CONTRAST TECHNIQUE: Multidetector CT imaging of the chest was performed using the standard protocol during bolus administration of intravenous contrast. Multiplanar CT image reconstructions and MIPs were obtained to evaluate the vascular anatomy. CONTRAST:  100 cc Omnipaque 300 COMPARISON:  CT scans 01/28/2015 and 04/30/2015 FINDINGS: Mediastinum/Nodes: The permanent left-sided Port-A-Cath is stable. No breast masses, supraclavicular or axillary lymphadenopathy. Scattered axillary lymph nodes are stable. The heart is normal in size. No pericardial effusion. Stable tortuosity and calcification of the  thoracic aorta but no dissection or focal aneurysm. The branch vessels are patent. Moderate atherosclerotic calcifications. Coronary artery calcifications are noted. The pulmonary arterial tree is well opacified. No filling defects to suggest pulmonary embolism. No mediastinal or hilar mass or lymphadenopathy. Stable small scattered lymph nodes. Stable matted soft tissue density in the right paratracheal area with maximal diameter of 10 mm. The esophagus is grossly normal. Lungs/Pleura: Stable emphysematous changes. Stable probable radiation changes involving the right upper lobe. No new or progressive findings. No evidence of pulmonary metastatic disease. Upper abdomen: No significant findings. Stable mild thickening of the left adrenal gland. Musculoskeletal: No significant bony findings. 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 Review of the MIP images confirms the above findings. IMPRESSION: 1. No CT findings for pulmonary embolism. 2. Stable atherosclerotic changes involving the thoracic aorta but no focal aneurysm or  dissection. 3. No CT findings worrisome for recurrent tumor or metastatic disease. Stable radiation changes involving the right upper lobe and stable emphysematous changes. 4. No acute pulmonary findings. 5. Stable thickened left adrenal gland. 6. 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 Electronically Signed   By: Marijo Sanes M.D.   On: 05/03/2015 21:21   I have personally reviewed and evaluated these images and lab results as part of my medical decision-making.   EKG Interpretation None      Date: 05/03/2015  Rate: 93  Rhythm: normal sinus rhythm  QRS Axis: normal  Intervals: normal  ST/T Wave abnormalities: normal  Conduction Disutrbances: none  Narrative Interpretation: unremarkable    MDM   Final diagnoses:  Dyspnea    CT angiogram of chest shows no pulmonary embolism. No CT findings worrisome for recurrent tumor  or metastatic disease. EKG and troponin negative. Discussed test results with the patient and her husband. Discharge medications albuterol inhaler and Tussionex.    Nat Christen, MD 05/03/15 [phone]

## 2015-05-03 NOTE — ED Notes (Signed)
Pt states she was recently dx with pneumonia and admitted to the hospital. Pt states she continues to cough. In addition, she feels fatigued with no direct points of pain. Pt is a lung cancer pt and hasn't had chemo or radiation since November.

## 2015-05-03 NOTE — Discharge Instructions (Signed)
Tests show no life-threatening condition. Use your inhaler as needed. Also prescription for cough syrup. Follow-up your doctor this week

## 2015-05-05 ENCOUNTER — Encounter: Payer: Self-pay | Admitting: Hematology and Oncology

## 2015-05-05 ENCOUNTER — Telehealth: Payer: Self-pay | Admitting: Hematology and Oncology

## 2015-05-05 ENCOUNTER — Ambulatory Visit (HOSPITAL_BASED_OUTPATIENT_CLINIC_OR_DEPARTMENT_OTHER): Payer: Medicaid Other | Admitting: Hematology and Oncology

## 2015-05-05 VITALS — BP 148/73 | HR 101 | Temp 98.0°F | Resp 189 | Wt 129.5 lb

## 2015-05-05 DIAGNOSIS — F17200 Nicotine dependence, unspecified, uncomplicated: Secondary | ICD-10-CM

## 2015-05-05 DIAGNOSIS — J438 Other emphysema: Secondary | ICD-10-CM | POA: Diagnosis not present

## 2015-05-05 DIAGNOSIS — R21 Rash and other nonspecific skin eruption: Secondary | ICD-10-CM | POA: Diagnosis not present

## 2015-05-05 DIAGNOSIS — I10 Essential (primary) hypertension: Secondary | ICD-10-CM

## 2015-05-05 DIAGNOSIS — C3491 Malignant neoplasm of unspecified part of right bronchus or lung: Secondary | ICD-10-CM | POA: Diagnosis not present

## 2015-05-05 DIAGNOSIS — J439 Emphysema, unspecified: Secondary | ICD-10-CM

## 2015-05-05 DIAGNOSIS — C3492 Malignant neoplasm of unspecified part of left bronchus or lung: Secondary | ICD-10-CM

## 2015-05-05 HISTORY — DX: Emphysema, unspecified: J43.9

## 2015-05-05 MED ORDER — PREDNISONE 20 MG PO TABS: 40.0000 mg | ORAL_TABLET | Freq: Every day | ORAL | Status: DC

## 2015-05-05 NOTE — Progress Notes (Signed)
[place] OFFICE PROGRESS NOTE  Patient Care Team: Rosita Fire, MD as PCP - General (Internal Medicine) Heath Lark, MD as Consulting Physician (Hematology and Oncology) Jodi Marble, MD as Consulting Physician (Otolaryngology) Eppie Gibson, MD as Attending Physician (Radiation Oncology)  SUMMARY OF ONCOLOGIC HISTORY: Oncology History   Bilateral lung cancer   Staging form: Lung, AJCC 6th Edition     Clinical stage from 10/01/2014: Stage IIIB (T4(m), N3, M0) - Signed by Heath Lark, MD on 10/01/2014 Cancer of upper lobe of right lung   Staging form: Lung, AJCC 7th Edition     Clinical: Stage Unknown ([place], N3, M0) - Signed by Eppie Gibson, MD on 10/07/2014       Bilateral lung cancer ([place])   09/05/2014 - 09/12/2014 Hospital Admission The patient was admitted to the hospital for further management of possible skin reaction, dysphagia and supraclavicular lymphadenopathy   09/06/2014 Imaging CT scan of the neck showed malignant appearing superior mediastinal and right thoracic inlet lymphadenopathy   09/10/2014 Imaging CT scan of the abdomen and pelvis confirmed right supraclavicular and mediastinal lymphadenopathy, as described above but no other evidence of metastatic cancer    09/14/2014 Procedure Flexible endoscopy in the ENT office failed to reveal any primary in the head and neck region   09/16/2014 Imaging She failed barium swallow. 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   01/28/2015 Imaging CT scan showed no active disease   04/30/2015 Imaging Mild patchy/ground-glass opacity in the right upper lung lobe, favored to reflect mild residual pneumonia. Associated 10 x 15 mm irregular nodular opacity in the right upper lobe, new, likely infectious.     INTERVAL HISTORY: Please see below for problem oriented charting. She returns for further follow-up. She had recent pneumonia. She continues to smoke periodically. She developed skin rash recently on her face. She has been out in the sun. She has some cough but no fever or chills. Appetite is stable. No new lymphadenopathy  REVIEW OF SYSTEMS:   Constitutional: Denies fevers, chills or abnormal weight loss Eyes: Denies blurriness of vision Ears, nose, mouth, throat, and face: Denies mucositis or sore throat Cardiovascular: Denies palpitation, chest discomfort or lower extremity swelling Gastrointestinal:  Denies nausea, heartburn or change in bowel habits Lymphatics: Denies new lymphadenopathy or easy bruising Neurological:Denies numbness, tingling or new weaknesses Behavioral/Psych: Mood is stable, no new changes  All other systems were reviewed with the patient and are negative.  I have reviewed the past medical history, past surgical history, social history and family history with the patient and they are unchanged from previous note.  ALLERGIES:  has No Known Allergies.  MEDICATIONS:  Current Outpatient Prescriptions  Medication Sig Dispense Refill  . amLODipine (NORVASC) 10 MG tablet Take 1 tablet (10 mg total) by mouth daily. 30 tablet 6  . ibuprofen (ADVIL,MOTRIN)  200 MG tablet Take 200 mg by mouth 2 (two) times daily as needed for mild pain or moderate pain.    .  chlorpheniramine-HYDROcodone (TUSSIONEX PENNKINETIC ER) 10-8 MG/5ML SUER Take 5 mLs by mouth every 12 (twelve) hours as needed for cough. (Patient not taking: Reported on 05/05/2015) 140 mL 0  . EPINEPHrine 0.3 mg/0.3 mL IJ SOAJ injection Inject 0.3 mLs (0.3 mg total) into the muscle once. (Patient not taking: Reported on 05/05/2015) 1 Device 0  . predniSONE (DELTASONE) 20 MG tablet Take 2 tablets (40 mg total) by mouth daily with breakfast. 14 tablet 0   No current facility-administered medications for this visit.    PHYSICAL EXAMINATION: ECOG PERFORMANCE STATUS: 1 - Symptomatic but completely ambulatory  Filed Vitals:   05/05/15 1028  BP: 148/73  Pulse: 101  Temp: 98 F (36.7 C)  Resp: 189   Filed Weights   05/05/15 1028  Weight: 129 lb 8 oz (58.741 kg)    GENERAL:alert, no distress and comfortable SKIN:she has mild skin rash on her face. EYES: normal, Conjunctiva are pink and non-injected, sclera clear OROPHARYNX:no exudate, no erythema and lips, buccal mucosa, and tongue normal  NECK: supple, thyroid normal size, non-tender, without nodularity LYMPH:  no palpable lymphadenopathy in the cervical, axillary or inguinal LUNGS: clear to auscultation and percussion with normal breathing effort HEART: regular rate & rhythm and no murmurs and no lower extremity edema ABDOMEN:abdomen soft, non-tender and normal bowel sounds Musculoskeletal:no cyanosis of digits and no clubbing  NEURO: alert & oriented x 3 with fluent speech, no focal motor/sensory deficits  LABORATORY DATA:  I have reviewed the data as listed    Component Value Date/Time   NA 141 05/03/2015 1832   NA 139 04/30/2015 1215   K 3.3* 05/03/2015 1832   K 3.7 04/30/2015 1215   CL 105 05/03/2015 1832   CO2 27 05/03/2015 1832   CO2 26 04/30/2015 1215   GLUCOSE 120* 05/03/2015 1832   GLUCOSE 105 04/30/2015 1215   BUN 16 05/03/2015 1832   BUN 16.6 04/30/2015 1215   CREATININE 1.02* 05/03/2015 1832   CREATININE 0.9  04/30/2015 1215   CALCIUM 9.2 05/03/2015 1832   CALCIUM 9.2 04/30/2015 1215   PROT 7.1 04/30/2015 1215   PROT 7.3 04/09/2015 2300   ALBUMIN 3.2* 04/30/2015 1215   ALBUMIN 3.5 04/09/2015 2300   AST 39* 04/30/2015 1215   AST 33 04/09/2015 2300   ALT 11 04/30/2015 1215   ALT 12* 04/09/2015 2300   ALKPHOS 170* 04/30/2015 1215   ALKPHOS 126 04/09/2015 2300   BILITOT 0.32 04/30/2015 1215   BILITOT 0.6 04/09/2015 2300   GFRNONAA 59* 05/03/2015 1832   GFRAA >60 05/03/2015 1832    No results found for: SPEP, UPEP  Lab Results  Component Value Date   WBC 5.7 05/03/2015   NEUTROABS 3.7 05/03/2015   HGB 12.6 05/03/2015   HCT 37.4 05/03/2015   MCV 91.0 05/03/2015   PLT 234 05/03/2015      Chemistry      Component Value Date/Time   NA 141 05/03/2015 1832   NA 139 04/30/2015 1215   K 3.3* 05/03/2015 1832   K 3.7 04/30/2015 1215   CL 105 05/03/2015 1832   CO2 27 05/03/2015 1832   CO2 26 04/30/2015 1215   BUN 16 05/03/2015 1832   BUN 16.6 04/30/2015 1215   CREATININE 1.02* 05/03/2015 1832   CREATININE 0.9 04/30/2015 1215      Component Value Date/Time   CALCIUM  9.2 05/03/2015 1832   CALCIUM 9.2 04/30/2015 1215   ALKPHOS 170* 04/30/2015 1215   ALKPHOS 126 04/09/2015 2300   AST 39* 04/30/2015 1215   AST 33 04/09/2015 2300   ALT 11 04/30/2015 1215   ALT 12* 04/09/2015 2300   BILITOT 0.32 04/30/2015 1215   BILITOT 0.6 04/09/2015 2300       RADIOGRAPHIC STUDIES: I have personally reviewed the radiological images as listed and agreed with the findings in the report. Dg Chest 2 View  05/03/2015  CLINICAL DATA:  Cough for 1 week. Recent pneumonia. History of stage III lung carcinoma EXAM: CHEST  2 VIEW COMPARISON:  April 14, 2015 chest radiograph and chest CT April 30, 2015 FINDINGS: There has been partial but incomplete clearing of infiltrate from the anterior segment right upper lobe. Lungs elsewhere clear. Heart size and pulmonary vascularity are normal. No adenopathy.  Port-A-Cath tip is in the superior vena cava. No pneumothorax. There is degenerative change in the thoracic spine. There is atherosclerotic calcification in the aorta. IMPRESSION: Partial but incomplete clearing of anterior segment right upper lobe infiltrate. No new opacity. Stable cardiac silhouette. No adenopathy evident. Electronically Signed   By: Lowella Grip III M.D.   On: 05/03/2015 18:54   Ct Angio Chest Pe W/cm &/or Wo Cm  05/03/2015  CLINICAL DATA:  Cough, fatigue and shortness of breath for 1 week. History of lung cancer. EXAM: CT ANGIOGRAPHY CHEST WITH CONTRAST TECHNIQUE: Multidetector CT imaging of the chest was performed using the standard protocol during bolus administration of intravenous contrast. Multiplanar CT image reconstructions and MIPs were obtained to evaluate the vascular anatomy. CONTRAST:  100 cc Omnipaque 300 COMPARISON:  CT scans 01/28/2015 and 04/30/2015 FINDINGS: Mediastinum/Nodes: The permanent left-sided Port-A-Cath is stable. No breast masses, supraclavicular or axillary lymphadenopathy. Scattered axillary lymph nodes are stable. The heart is normal in size. No pericardial effusion. Stable tortuosity and calcification of the thoracic aorta but no dissection or focal aneurysm. The branch vessels are patent. Moderate atherosclerotic calcifications. Coronary artery calcifications are noted. The pulmonary arterial tree is well opacified. No filling defects to suggest pulmonary embolism. No mediastinal or hilar mass or lymphadenopathy. Stable small scattered lymph nodes. Stable matted soft tissue density in the right paratracheal area with maximal diameter of 10 mm. The esophagus is grossly normal. Lungs/Pleura: Stable emphysematous changes. Stable probable radiation changes involving the right upper lobe. No new or progressive findings. No evidence of pulmonary metastatic disease. Upper abdomen: No significant findings. Stable mild thickening of the left adrenal gland.  Musculoskeletal: No significant bony findings. 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 Review of the MIP images confirms the above findings. IMPRESSION: 1. No CT findings for pulmonary embolism. 2. Stable atherosclerotic changes involving the thoracic aorta but no focal aneurysm or dissection. 3. No CT findings worrisome for recurrent tumor or metastatic disease. Stable radiation changes involving the right upper lobe and stable emphysematous changes. 4. No acute pulmonary findings. 5. Stable thickened left adrenal gland. 6. 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 Electronically Signed   By: Marijo Sanes M.D.   On: 05/03/2015 21:21     ASSESSMENT & PLAN:  Bilateral lung cancer ([place]) I reviewed imaging study with her and her son. Recent CT scan showed persistent abnormality, thought to be related to recent infection Plan is to repeat CT scan again in 3 months Plan to see her back in 3 months but we will flush her port every 6-8  weeks. If the future scan is negative, we can consider removing her port.   Essential hypertension Her blood pressure control is improved. I recommend she follows closely with primary care doctor for chronic blood pressure management    Emphysema of lung ([place]) She has significant cough recently likely due to COPD exacerbation. I recommend short course prednisone therapy  Rash, skin She has significant skin rash from recent sun exposure. I recommend avoiding excessive sun exposure for now  Nicotine dependence I spent some time counseling the patient the importance of tobacco cessation. she is currently attempting to quit on her own   Orders Placed This Encounter  Procedures  . CT Chest W Contrast    Standing Status: Future     Number of Occurrences:      Standing Expiration Date: 07/04/2016    Order Specific Question:  Reason for Exam (SYMPTOM  OR DIAGNOSIS REQUIRED)    Answer:  lung cancer, recent  infection, need to exclude recurrence    Order Specific Question:  Is the patient pregnant?    Answer:  No    Order Specific Question:  Preferred imaging location?    Answer:  [facility]   All questions were answered. The patient knows to call the clinic with any problems, questions or concerns. No barriers to learning was detected. I spent 25 minutes counseling the patient face to face. The total time spent in the appointment was 30 minutes and more than 50% was on counseling and review of test results     [facility], MD 05/05/2015 10:59 AM

## 2015-05-05 NOTE — Assessment & Plan Note (Signed)
I reviewed imaging study with her and her son. Recent CT scan showed persistent abnormality, thought to be related to recent infection Plan is to repeat CT scan again in 3 months Plan to see her back in 3 months but we will flush her port every 6-8 weeks. If the future scan is negative, we can consider removing her port.

## 2015-05-05 NOTE — Telephone Encounter (Signed)
Pt wanted flush same day as radonc appt...done..reprinted sched for June for pt

## 2015-05-05 NOTE — Assessment & Plan Note (Signed)
I spent some time counseling the patient the importance of tobacco cessation. she is currently attempting to quit on her own 

## 2015-05-05 NOTE — Assessment & Plan Note (Signed)
She has significant skin rash from recent sun exposure. I recommend avoiding excessive sun exposure for now

## 2015-05-05 NOTE — Assessment & Plan Note (Signed)
She has significant cough recently likely due to COPD exacerbation. I recommend short course prednisone therapy

## 2015-05-05 NOTE — Telephone Encounter (Signed)
Gave pt appt for June/july & avs

## 2015-05-05 NOTE — Assessment & Plan Note (Signed)
Her blood pressure control is improved. I recommend she follows closely with primary care doctor for chronic blood pressure management

## 2015-05-24 ENCOUNTER — Encounter (HOSPITAL_COMMUNITY): Payer: Self-pay | Admitting: Emergency Medicine

## 2015-05-24 ENCOUNTER — Inpatient Hospital Stay (HOSPITAL_COMMUNITY)
Admission: EM | Admit: 2015-05-24 | Discharge: 2015-06-01 | DRG: 683 | Disposition: A | Discharge status: Home / Self Care | Payer: Medicaid Other | Attending: Internal Medicine | Admitting: Internal Medicine

## 2015-05-24 ENCOUNTER — Emergency Department (HOSPITAL_COMMUNITY): Payer: Medicaid Other

## 2015-05-24 DIAGNOSIS — D6481 Anemia due to antineoplastic chemotherapy: Secondary | ICD-10-CM | POA: Diagnosis not present

## 2015-05-24 DIAGNOSIS — C3411 Malignant neoplasm of upper lobe, right bronchus or lung: Secondary | ICD-10-CM | POA: Diagnosis present

## 2015-05-24 DIAGNOSIS — K208 Other esophagitis without bleeding: Secondary | ICD-10-CM | POA: Diagnosis present

## 2015-05-24 DIAGNOSIS — N179 Acute kidney failure, unspecified: Principal | ICD-10-CM | POA: Diagnosis present

## 2015-05-24 DIAGNOSIS — I10 Essential (primary) hypertension: Secondary | ICD-10-CM | POA: Diagnosis present

## 2015-05-24 DIAGNOSIS — T462X5A Adverse effect of other antidysrhythmic drugs, initial encounter: Secondary | ICD-10-CM | POA: Diagnosis present

## 2015-05-24 DIAGNOSIS — E860 Dehydration: Secondary | ICD-10-CM | POA: Diagnosis present

## 2015-05-24 DIAGNOSIS — T451X5A Adverse effect of antineoplastic and immunosuppressive drugs, initial encounter: Secondary | ICD-10-CM | POA: Diagnosis present

## 2015-05-24 DIAGNOSIS — C3410 Malignant neoplasm of upper lobe, unspecified bronchus or lung: Secondary | ICD-10-CM | POA: Diagnosis present

## 2015-05-24 DIAGNOSIS — R21 Rash and other nonspecific skin eruption: Secondary | ICD-10-CM | POA: Diagnosis present

## 2015-05-24 DIAGNOSIS — T361X5A Adverse effect of cephalosporins and other beta-lactam antibiotics, initial encounter: Secondary | ICD-10-CM | POA: Diagnosis not present

## 2015-05-24 DIAGNOSIS — F17210 Nicotine dependence, cigarettes, uncomplicated: Secondary | ICD-10-CM | POA: Diagnosis present

## 2015-05-24 DIAGNOSIS — T364X5A Adverse effect of tetracyclines, initial encounter: Secondary | ICD-10-CM | POA: Diagnosis present

## 2015-05-24 DIAGNOSIS — N390 Urinary tract infection, site not specified: Secondary | ICD-10-CM | POA: Diagnosis present

## 2015-05-24 DIAGNOSIS — L511 Stevens-Johnson syndrome: Secondary | ICD-10-CM | POA: Diagnosis present

## 2015-05-24 LAB — BASIC METABOLIC PANEL
Anion gap: 9 (ref 5–15)
BUN: 46 mg/dL — AB (ref 6–20)
CO2: 24 mmol/L (ref 22–32)
CREATININE: 1.66 mg/dL — AB (ref 0.44–1.00)
Calcium: 9.3 mg/dL (ref 8.9–10.3)
Chloride: 105 mmol/L (ref 101–111)
GFR calc Af Amer: 38 mL/min — ABNORMAL LOW (ref >60)
GFR, EST NON AFRICAN AMERICAN: 33 mL/min — AB (ref >60)
Glucose, Bld: 105 mg/dL — ABNORMAL HIGH (ref 65–99)
POTASSIUM: 4.4 mmol/L (ref 3.5–5.1)
SODIUM: 138 mmol/L (ref 135–145)

## 2015-05-24 LAB — URINALYSIS, ROUTINE W REFLEX MICROSCOPIC
BILIRUBIN URINE: NEGATIVE
Glucose, UA: NEGATIVE mg/dL
HGB URINE DIPSTICK: NEGATIVE
KETONES UR: NEGATIVE mg/dL
Nitrite: NEGATIVE
Specific Gravity, Urine: >1.03 — ABNORMAL HIGH (ref 1.005–1.030)
pH: 5 (ref 5.0–8.0)

## 2015-05-24 LAB — URINE MICROSCOPIC-ADD ON: RBC / HPF: NONE SEEN RBC/hpf (ref 0–5)

## 2015-05-24 LAB — CBC WITH DIFFERENTIAL/PLATELET
Basophils Absolute: 0 10*3/uL (ref 0.0–0.1)
Basophils Relative: 0 %
EOS ABS: 0.2 10*3/uL (ref 0.0–0.7)
EOS PCT: 2 %
HCT: 35.6 % — ABNORMAL LOW (ref 36.0–46.0)
Hemoglobin: 12 g/dL (ref 12.0–15.0)
LYMPHS ABS: 1.3 10*3/uL (ref 0.7–4.0)
Lymphocytes Relative: 14 %
MCH: 30.5 pg (ref 26.0–34.0)
MCHC: 33.7 g/dL (ref 30.0–36.0)
MCV: 90.4 fL (ref 78.0–100.0)
Monocytes Absolute: 0.7 10*3/uL (ref 0.1–1.0)
Monocytes Relative: 7 %
Neutro Abs: 7.4 10*3/uL (ref 1.7–7.7)
Neutrophils Relative %: 77 %
PLATELETS: 190 10*3/uL (ref 150–400)
RBC: 3.94 MIL/uL (ref 3.87–5.11)
RDW: 18.1 % — ABNORMAL HIGH (ref 11.5–15.5)
WBC: 9.6 10*3/uL (ref 4.0–10.5)

## 2015-05-24 MED ORDER — DEXTROSE 5 % IV SOLN
1.0000 g | Freq: Once | INTRAVENOUS | Status: AC
  Administered 2015-05-24: 1 g via INTRAVENOUS
  Filled 2015-05-24: qty 10

## 2015-05-24 MED ORDER — SODIUM CHLORIDE 0.9 % IV BOLUS (SEPSIS)
1000.0000 mL | Freq: Once | INTRAVENOUS | Status: AC
  Administered 2015-05-24: 1000 mL via INTRAVENOUS

## 2015-05-24 NOTE — ED Notes (Signed)
Patient brought in by EMS, states she was recently diagnosed with pneumonia. Complaining of bilateral ear pain, sore throat, bilateral arm pain, and shortness of breath x 2 weeks. Per EMS, patient's O2 saturation was 87% on room air. Patient placed on 6L O2 via EMS, O2 saturation increased to 98%.

## 2015-05-24 NOTE — ED Notes (Signed)
MD Zammit at bedside.

## 2015-05-24 NOTE — H&P (Signed)
History and Physical    Megan Sims:[id number] DOB: 1955-10-08 DOA: 05/24/2015  Referring MD/NP/PA: Dr. [name] PCP: Rosita Fire, MD  Outpatient Specialists: Dr. Jodi Marble, Otolaryngology; Dr. Heath Lark, Hematology and Oncology; Dr. Eppie Gibson Radiation Oncology  Patient coming from: Home   Chief Complaint: Generalized body aches and shortness of breath   HPI: Megan Sims is a 60 y.o. female with medical history significant of lung cancer, paget's disease of bone, emphysema of the lungs, and HTN presented with complaints of worsening fatigue, SOB, sore throat with associated pain when swallowing, SOB, and rash. Sore throat new onset. Rash onset three weeks ago after prescribed completing a course of Doxycycline ('50mg'$  BID)  for outpatient pneumonia. She reports while taking the abx, she had sun exposure. She typically does not use sun protection. Rash has been consistent.   ED Course: CMP unremarkable, except Creatinine 1.66 CBC unremarkable, UA positive for UTI. She has been referred for admission.  Review of Systems: As per HPI otherwise 10 point review of systems negative.    Past Medical History  Diagnosis Date  . Migraine   . Lung abnormality   . Hypertension   . Pleurisy   . Cerebral aneurysm 2 brain surgeries 96 or 97  . Paget disease of bone   . Diarrhea   . Renal insufficiency     Patient states " no kidney problems  . Malignant neoplasm of unknown origin ([place])   . S/P biopsy     of throat per patient.  . Oral thrush 09/24/2014  . Nicotine dependence 09/24/2014  . Chronic neck pain 09/24/2014  . Cancer [facility])     malignant neoplasm of unknown origin  . Skin ulcer ([place]) 11/18/2014  . Emphysema of lung ([place]) 05/05/2015    Past Surgical History  Procedure Laterality Date  . Chest tube insertion    . Tubal ligation    . Cerebral aneurysm repair  96 or 97  . Colonoscopy       reports that she has been smoking Cigarettes.  She has a 17 pack-year smoking  history. She has never used smokeless tobacco. She reports that she does not drink alcohol or use illicit drugs.  No Known Allergies  Family History  Problem Relation Age of Onset  . Hypertension    . Diabetes    . Kidney disease    . Cancer Mother     throat ca  . Cancer Maternal Grandmother     thyroid ca     Prior to Admission medications   Medication Sig Start Date End Date Taking? Authorizing Provider  amLODipine (NORVASC) 10 MG tablet Take 1 tablet (10 mg total) by mouth daily. 11/11/14   Heath Lark, MD  chlorpheniramine-HYDROcodone (TUSSIONEX PENNKINETIC ER) 10-8 MG/5ML SUER Take 5 mLs by mouth every 12 (twelve) hours as needed for cough. Patient not taking: Reported on 05/05/2015 05/03/15   Nat Christen, MD  EPINEPHrine 0.3 mg/0.3 mL IJ SOAJ injection Inject 0.3 mLs (0.3 mg total) into the muscle once. Patient not taking: Reported on 05/05/2015 09/12/14   Cherene Altes, MD  ibuprofen (ADVIL,MOTRIN) 200 MG tablet Take 200 mg by mouth 2 (two) times daily as needed for mild pain or moderate pain.    Historical Provider, MD  predniSONE (DELTASONE) 20 MG tablet Take 2 tablets (40 mg total) by mouth daily with breakfast. 05/05/15   Heath Lark, MD    Physical Exam: Filed Vitals:   05/24/15 1909 05/24/15 2205  BP:  143/76 157/91  Pulse: 116 113  Temp: 98.2 F (36.8 C)   TempSrc: Oral   Resp: 20 18  Height: '5\' 1"'$  (1.549 m)   Weight: 55.339 kg (122 lb)   SpO2: 100% 98%      Constitutional: NAD, calm, comfortable Filed Vitals:   05/24/15 1909 05/24/15 2205  BP: 143/76 157/91  Pulse: 116 113  Temp: 98.2 F (36.8 C)   TempSrc: Oral   Resp: 20 18  Height: '5\' 1"'$  (1.549 m)   Weight: 55.339 kg (122 lb)   SpO2: 100% 98%    Respiratory: clear to auscultation bilaterally, no wheezing, no crackles. Normal respiratory effort. No accessory muscle use.  Cardiovascular: Regular rate and rhythm, no murmurs / rubs / gallops. No extremity edema. 2+ pedal pulses. No carotid bruits.    Abdomen: no tenderness, no masses palpated. No hepatosplenomegaly. Bowel sounds positive.  Musculoskeletal: no clubbing / cyanosis. No joint deformity upper and lower extremities. Good ROM, no contractures. Normal muscle tone.  Skin: rashes over cheeks and shoulder. No induration. Demarcation on nose. Area c/w sun exposure.  Neurologic: CN 2-12 grossly intact. Sensation intact, DTR normal. Strength 5/5 in all 4.  Psychiatric: Normal judgment and insight. Alert and oriented x 3. Normal mood.    Labs on Admission: I have personally reviewed following labs and imaging studies  CBC:  Recent Labs Lab 05/24/15 2005  WBC 9.6  NEUTROABS 7.4  HGB 12.0  HCT 35.6*  MCV 90.4  PLT 017   Basic Metabolic Panel:  Recent Labs Lab 05/24/15 2005  NA 138  K 4.4  CL 105  CO2 24  GLUCOSE 105*  BUN 46*  CREATININE 1.66*  CALCIUM 9.3    Urine analysis:    Component Value Date/Time   COLORURINE YELLOW 05/24/2015 1930   APPEARANCEUR CLEAR 05/24/2015 1930   LABSPEC >1.030* 05/24/2015 1930   PHURINE 5.0 05/24/2015 1930   GLUCOSEU NEGATIVE 05/24/2015 1930   HGBUR NEGATIVE 05/24/2015 1930   BILIRUBINUR NEGATIVE 05/24/2015 1930   KETONESUR NEGATIVE 05/24/2015 1930   PROTEINUR TRACE* 05/24/2015 1930   UROBILINOGEN 0.2 10/25/2014 0021   NITRITE NEGATIVE 05/24/2015 1930   LEUKOCYTESUR TRACE* 05/24/2015 1930    Radiological Exams on Admission: Dg Chest 2 View  05/24/2015  CLINICAL DATA:  Shortness of breath for 2 weeks. History of lung carcinoma EXAM: CHEST  2 VIEW COMPARISON:  Chest radiograph and chest CT May 03, 2015 FINDINGS: There is scarring right upper lobe, stable. There is no frank edema or consolidation. Heart size and pulmonary vascularity are normal. No adenopathy. Port-A-Cath tip is in the superior vena cava. No pneumothorax. No bone lesions are evident. There is degenerative change in the thoracic spine. 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  IMPRESSION: Scarring right upper lobe. No edema or consolidation. 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 Electronically Signed   By: Lowella Grip III M.D.   On: 05/24/2015 19:38    Assessment/Plan  1. UTI, revealed on UA. Started on rocephin. Follow up UC.  2. Acute respiratory failure with hypoxia. Placed on 6L [place]. Continue to wean.  3. AKI, likely related to poor oral intake. Continue IVF. Follow Cr.  D/C NSAIDS.  4. Sore throat secondary to pill induced esophagitis due to tetracycline. Start on Carafate.  Doxy discontinued.  5. Sun sensitive rash on face and shoulder due to tetracycline.   6. Dehydration, likely secondary to poor oral intake. Will give IVF, follow Cr.  7. History of cancer of upper lobe of rignht lung. Last radiation treatment 11/2014.  8. Anemia due to antineoplastic chemotherapy.   DVT prophylaxis: SCDs Code Status: Full Family Communication: Sons and grandson bedside Disposition Plan: Anticipate discharge home in 1-2 days.  Consults called: None Admission status: Admit to medical bed.   Orvan Falconer MD FACP. [facility] Pager [phone]  If 7PM-7AM, please contact night-coverage [email or web address] Password [email or web address]  05/24/2015, 10:30 PM    By signing my name below, I, Rennis Harding, attest that this documentation has been prepared under the direction and in the presence of Orvan Falconer, MD. Electronically signed: Rennis Harding, Scribe. 05/24/2015 10:45pm   I, Dr. Orvan Falconer, personally performed the services described in this documentation. All medical record entries made by the scribe were at my discretion and in my presence. Orvan Falconer, MD 05/24/2015

## 2015-05-24 NOTE — ED Provider Notes (Signed)
CSN: [id number]     Arrival date & time 05/24/15  1905 History  By signing my name below, I, Megan Sims, attest that this documentation has been prepared under the direction and in the presence of Megan Ferguson, MD. Electronically Signed: Doran Sims, ED Scribe. 05/24/2015. 10:22 PM.   Chief Complaint  Patient presents with  . Generalized Body Aches  . Shortness of Breath   Patient is a 60 y.o. female presenting with rash. The history is provided by the patient and a relative. No language interpreter was used.  Rash Location:  Face and shoulder/arm Facial rash location:  Face Shoulder/arm rash location:  L arm and R arm Quality: scaling   Severity:  Moderate Onset quality:  Gradual Duration:  2 weeks Timing:  Constant Progression:  Worsening Chronicity:  Recurrent Relieved by:  Nothing Ineffective treatments:  None tried Associated symptoms: fatigue, shortness of breath and sore throat   Associated symptoms: no abdominal pain, no diarrhea and no headaches    HPI Comments: Megan Sims is a 60 y.o. female brought by EMS to the Emergency Department with a PMHx of lung cancer, pneumonia, and HTN complaining of worsening rash to her face and arm that began 2 weeks ago. Pt also reports fatigue, sore throat, secondary painful swallowing, mild SOB, ear pain, and tooth ache. Daughter states the pt had a similar symptoms when she was dx with lung cancer in 06/2014. Pt denies any other complaints at this time. Pt is not on oxygen at home.   Past Medical History  Diagnosis Date  . Migraine   . Lung abnormality   . Hypertension   . Pleurisy   . Cerebral aneurysm 2 brain surgeries 96 or 97  . Paget disease of bone   . Diarrhea   . Renal insufficiency     Patient states " no kidney problems  . Malignant neoplasm of unknown origin ([place])   . S/P biopsy     of throat per patient.  . Oral thrush 09/24/2014  . Nicotine dependence 09/24/2014  . Chronic neck pain 09/24/2014  . Cancer [facility])    malignant neoplasm of unknown origin  . Skin ulcer ([place]) 11/18/2014  . Emphysema of lung ([place]) 05/05/2015   Past Surgical History  Procedure Laterality Date  . Chest tube insertion    . Tubal ligation    . Cerebral aneurysm repair  96 or 97  . Colonoscopy     Family History  Problem Relation Age of Onset  . Hypertension    . Diabetes    . Kidney disease    . Cancer Mother     throat ca  . Cancer Maternal Grandmother     thyroid ca   Social History  Substance Use Topics  . Smoking status: Current Some Day Smoker -- 0.50 packs/day for 34 years    Types: Cigarettes  . Smokeless tobacco: Never Used  . Alcohol Use: No   OB History    Gravida Para Term Preterm AB TAB SAB Ectopic Multiple Living            3     Review of Systems  Constitutional: Positive for fatigue. Negative for appetite change.  HENT: Positive for dental problem, ear pain, sore throat and trouble swallowing. Negative for congestion, ear discharge and sinus pressure.   Eyes: Negative for discharge.  Respiratory: Positive for shortness of breath. Negative for cough.   Cardiovascular: Negative for chest pain.  Gastrointestinal: Negative for abdominal pain and diarrhea.  Genitourinary: Negative for frequency and hematuria.  Musculoskeletal: Negative for back pain.  Skin: Positive for rash.  Neurological: Negative for seizures and headaches.  Psychiatric/Behavioral: Negative for hallucinations.    Allergies  Review of patient's allergies indicates no known allergies.  Home Medications   Prior to Admission medications   Medication Sig Start Date End Date Taking? Authorizing Provider  amLODipine (NORVASC) 10 MG tablet Take 1 tablet (10 mg total) by mouth daily. 11/11/14   Heath Lark, MD  chlorpheniramine-HYDROcodone (TUSSIONEX PENNKINETIC ER) 10-8 MG/5ML SUER Take 5 mLs by mouth every 12 (twelve) hours as needed for cough. Patient not taking: Reported on 05/05/2015 05/03/15   Nat Christen, MD  EPINEPHrine 0.3  mg/0.3 mL IJ SOAJ injection Inject 0.3 mLs (0.3 mg total) into the muscle once. Patient not taking: Reported on 05/05/2015 09/12/14   Cherene Altes, MD  ibuprofen (ADVIL,MOTRIN) 200 MG tablet Take 200 mg by mouth 2 (two) times daily as needed for mild pain or moderate pain.    Historical Provider, MD  predniSONE (DELTASONE) 20 MG tablet Take 2 tablets (40 mg total) by mouth daily with breakfast. 05/05/15   Ni Gorsuch, MD   BP 157/91 mmHg  Pulse 113  Temp(Src) 98.2 F (36.8 C) (Oral)  Resp 18  Ht '5\' 1"'$  (1.549 m)  Wt 122 lb (55.339 kg)  BMI 23.06 kg/m2  SpO2 98%   Physical Exam  Constitutional: She is oriented to person, place, and time. She appears well-developed.  HENT:  Head: Normocephalic.  Mouth/Throat: Mucous membranes are dry.  Eyes: Conjunctivae and EOM are normal. No scleral icterus.  Neck: Neck supple. No thyromegaly present.  Cardiovascular: Normal rate and regular rhythm.  Exam reveals no gallop and no friction rub.   No murmur heard. Pulmonary/Chest: No stridor. She has no wheezes. She has no rales. She exhibits no tenderness.  Abdominal: She exhibits no distension. There is no tenderness. There is no rebound.  Musculoskeletal: Normal range of motion. She exhibits no edema.  Lymphadenopathy:    She has no cervical adenopathy.  Neurological: She is oriented to person, place, and time. She exhibits normal muscle tone. Coordination normal.  Skin: No rash noted. No erythema.  confluent rash to the face and arm that give her scaly skin  Psychiatric: She has a normal mood and affect. Her behavior is normal.    ED Course  Procedures  DIAGNOSTIC STUDIES: Oxygen Saturation is 98% on nasal canula , normal by my interpretation.    COORDINATION OF CARE: 10:15 PM Will give fluids and Rocephin. Will order EKG, blood work, urinalysis and urine culture Discussed treatment plan with pt and family at bedside and they agreed to plan.  Labs Review Labs Reviewed  CBC WITH  DIFFERENTIAL/PLATELET - Abnormal; Notable for the following:    HCT 35.6 (*)    RDW 18.1 (*)    All other components within normal limits  BASIC METABOLIC PANEL - Abnormal; Notable for the following:    Glucose, Bld 105 (*)    BUN 46 (*)    Creatinine, Ser 1.66 (*)    GFR calc non Af Amer 33 (*)    GFR calc Af Amer 38 (*)    All other components within normal limits  URINALYSIS, ROUTINE W REFLEX MICROSCOPIC (NOT AT [facility]) - Abnormal; Notable for the following:    Specific Gravity, Urine >1.030 (*)    Protein, ur TRACE (*)    Leukocytes, UA TRACE (*)    All other components within normal  limits  URINE MICROSCOPIC-ADD ON - Abnormal; Notable for the following:    Squamous Epithelial / LPF 6-30 (*)    Bacteria, UA MANY (*)    All other components within normal limits    Imaging Review Dg Chest 2 View  05/24/2015  CLINICAL DATA:  Shortness of breath for 2 weeks. History of lung carcinoma EXAM: CHEST  2 VIEW COMPARISON:  Chest radiograph and chest CT May 03, 2015 FINDINGS: There is scarring right upper lobe, stable. There is no frank edema or consolidation. Heart size and pulmonary vascularity are normal. No adenopathy. Port-A-Cath tip is in the superior vena cava. No pneumothorax. No bone lesions are evident. There is degenerative change in the thoracic spine. 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 IMPRESSION: Scarring right upper lobe. No edema or consolidation. 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 Electronically Signed   By: Lowella Grip III M.D.   On: 05/24/2015 19:38   I have personally reviewed and evaluated these images and lab results as part of my medical decision-making.   EKG Interpretation None      MDM   Final diagnoses:  None    Patient with dehydration and urinary tract infection. She will be admitted IV hydration and antibiotics.  The chart was scribed for me under my direct supervision.  I personally  performed the history, physical, and medical decision making and all procedures in the evaluation of this patient.Megan Ferguson, MD 05/24/15 [phone]

## 2015-05-24 NOTE — ED Notes (Signed)
Pt's son called upset due to pt having to wait due to being brought in by ems. I informed him we would get to his mother as soon as we can that there were people in front of her. Pt asked if her could take her to [place] and I informed him that we could not hold her here if she wanted to go there. Pt is cancer pt, but not currently receiving chemotherapy at this time.

## 2015-05-25 DIAGNOSIS — K208 Other esophagitis: Secondary | ICD-10-CM | POA: Diagnosis present

## 2015-05-25 DIAGNOSIS — I10 Essential (primary) hypertension: Secondary | ICD-10-CM | POA: Diagnosis present

## 2015-05-25 DIAGNOSIS — R52 Pain, unspecified: Secondary | ICD-10-CM | POA: Diagnosis present

## 2015-05-25 DIAGNOSIS — T451X5A Adverse effect of antineoplastic and immunosuppressive drugs, initial encounter: Secondary | ICD-10-CM | POA: Diagnosis present

## 2015-05-25 DIAGNOSIS — T364X5A Adverse effect of tetracyclines, initial encounter: Secondary | ICD-10-CM | POA: Diagnosis present

## 2015-05-25 DIAGNOSIS — L511 Stevens-Johnson syndrome: Secondary | ICD-10-CM | POA: Diagnosis not present

## 2015-05-25 DIAGNOSIS — T361X5A Adverse effect of cephalosporins and other beta-lactam antibiotics, initial encounter: Secondary | ICD-10-CM | POA: Diagnosis not present

## 2015-05-25 DIAGNOSIS — N390 Urinary tract infection, site not specified: Secondary | ICD-10-CM | POA: Diagnosis present

## 2015-05-25 DIAGNOSIS — E860 Dehydration: Secondary | ICD-10-CM | POA: Diagnosis present

## 2015-05-25 DIAGNOSIS — C3410 Malignant neoplasm of upper lobe, unspecified bronchus or lung: Secondary | ICD-10-CM | POA: Diagnosis present

## 2015-05-25 DIAGNOSIS — N179 Acute kidney failure, unspecified: Secondary | ICD-10-CM | POA: Diagnosis not present

## 2015-05-25 DIAGNOSIS — D6481 Anemia due to antineoplastic chemotherapy: Secondary | ICD-10-CM | POA: Diagnosis present

## 2015-05-25 DIAGNOSIS — F17210 Nicotine dependence, cigarettes, uncomplicated: Secondary | ICD-10-CM | POA: Diagnosis present

## 2015-05-25 LAB — BASIC METABOLIC PANEL
ANION GAP: 5 (ref 5–15)
BUN: 37 mg/dL — ABNORMAL HIGH (ref 6–20)
CHLORIDE: 108 mmol/L (ref 101–111)
CO2: 25 mmol/L (ref 22–32)
Calcium: 8.6 mg/dL — ABNORMAL LOW (ref 8.9–10.3)
Creatinine, Ser: 1.33 mg/dL — ABNORMAL HIGH (ref 0.44–1.00)
GFR calc non Af Amer: 42 mL/min — ABNORMAL LOW (ref >60)
GFR, EST AFRICAN AMERICAN: 49 mL/min — AB (ref >60)
Glucose, Bld: 111 mg/dL — ABNORMAL HIGH (ref 65–99)
Potassium: 4.2 mmol/L (ref 3.5–5.1)
Sodium: 138 mmol/L (ref 135–145)

## 2015-05-25 LAB — CBC
HCT: 32.9 % — ABNORMAL LOW (ref 36.0–46.0)
HEMOGLOBIN: 10.7 g/dL — AB (ref 12.0–15.0)
MCH: 29.7 pg (ref 26.0–34.0)
MCHC: 32.5 g/dL (ref 30.0–36.0)
MCV: 91.4 fL (ref 78.0–100.0)
Platelets: 174 10*3/uL (ref 150–400)
RBC: 3.6 MIL/uL — ABNORMAL LOW (ref 3.87–5.11)
RDW: 17.9 % — ABNORMAL HIGH (ref 11.5–15.5)
WBC: 7.8 10*3/uL (ref 4.0–10.5)

## 2015-05-25 LAB — TSH: TSH: 3.281 u[IU]/mL (ref 0.350–4.500)

## 2015-05-25 MED ORDER — DEXTROSE 5 % IV SOLN
1.0000 g | INTRAVENOUS | Status: DC
  Administered 2015-05-25: 1 g via INTRAVENOUS
  Filled 2015-05-25: qty 10

## 2015-05-25 MED ORDER — ACETAMINOPHEN 500 MG PO TABS
500.0000 mg | ORAL_TABLET | Freq: Four times a day (QID) | ORAL | Status: DC | PRN
  Administered 2015-05-25: 500 mg via ORAL
  Filled 2015-05-25: qty 1

## 2015-05-25 MED ORDER — OXYCODONE-ACETAMINOPHEN 5-325 MG PO TABS
2.0000 | ORAL_TABLET | Freq: Once | ORAL | Status: AC
Start: 2015-05-25 — End: 2015-05-25
  Administered 2015-05-25: 2 via ORAL
  Filled 2015-05-25: qty 2

## 2015-05-25 MED ORDER — ENSURE ENLIVE PO LIQD
237.0000 mL | Freq: Two times a day (BID) | ORAL | Status: DC
  Administered 2015-05-25 – 2015-05-31 (×7): 237 mL via ORAL

## 2015-05-25 MED ORDER — SUCRALFATE 1 GM/10ML PO SUSP
1.0000 g | Freq: Three times a day (TID) | ORAL | Status: DC
  Administered 2015-05-25 – 2015-06-01 (×30): 1 g via ORAL
  Filled 2015-05-25 (×30): qty 10

## 2015-05-25 MED ORDER — DEXTROSE-NACL 5-0.9 % IV SOLN
INTRAVENOUS | Status: DC
  Administered 2015-05-25 – 2015-05-27 (×5): via INTRAVENOUS

## 2015-05-25 MED ORDER — OXYCODONE-ACETAMINOPHEN 5-325 MG PO TABS
1.0000 | ORAL_TABLET | Freq: Four times a day (QID) | ORAL | Status: DC | PRN
  Administered 2015-05-25 – 2015-05-26 (×4): 1 via ORAL
  Filled 2015-05-25 (×6): qty 1

## 2015-05-25 MED ORDER — AMLODIPINE BESYLATE 5 MG PO TABS
10.0000 mg | ORAL_TABLET | Freq: Every day | ORAL | Status: DC
  Administered 2015-05-25 – 2015-06-01 (×8): 10 mg via ORAL
  Filled 2015-05-25 (×8): qty 2

## 2015-05-25 NOTE — Care Management Note (Signed)
Case Management Note  Patient Details  Name: KOMAL STANGELO MRN: [id number] Date of Birth: Jan 08, 1956  Subjective/Objective:                  Pt admitted with AKI. Pt is from home, lives with son and is ind with ADL's. Pt has PCP, transportation and no difficulty obtaining medications. Pt has no DME needs PTA. Pt plans to return home with self care at DC.   Action/Plan: No CM needs anticipated.   Expected Discharge Date:    05/26/2015              Expected Discharge Plan:  Home/Self Care  In-House Referral:  NA  Discharge planning Services  CM Consult  Post Acute Care Choice:  NA Choice offered to:  NA  DME Arranged:    DME Agency:     HH Arranged:    HH Agency:     Status of Service:  Completed, signed off  Medicare Important Message Given:    Date Medicare IM Given:    Medicare IM give by:    Date Additional Medicare IM Given:    Additional Medicare Important Message give by:     If discussed at [place] of Stay Meetings, dates discussed:    Additional Comments:  Sherald Barge, RN 05/25/2015, 2:16 PM

## 2015-05-25 NOTE — Progress Notes (Signed)
Initial Nutrition Assessment  DOCUMENTATION CODES:  Not applicable  INTERVENTION:  Ensure Enlive po BID, each supplement provides 350 kcal and 20 grams of protein  Soft diet  NUTRITION DIAGNOSIS:  Inadequate oral intake related to Very sore throat, painful swallowing as evidenced by per patient/family report and aki/dehydration believed to be related to poor intake.  GOAL:  Patient will meet greater than or equal to 90% of their needs  MONITOR:  PO intake, Supplement acceptance, Labs, Skin  REASON FOR ASSESSMENT:  Malnutrition Screening Tool    ASSESSMENT:  60 y/o female PMHx HTN, emphysema and Lung cancer (last radiation 11/16). Presents w/ generalized body aches and SOB. Pt also complains of worsening fatigue sore throat associated with painful swallowing and rash. Workup reveals UTI and acute resp failure.   Pt reports that for about 2 weeks she has had a sore throat. As a result, she has been just picking at her meals and has had trouble chewing. She cannot tolerate anything dry due to her mouth soreness, hence why she is on a softer diet.   Prior to onset of soreness. Pt reports voracious appetite. She would eat 4-6 meals a day. She did not follow any type of diet nor did she take any vitamin or minerals supplements. She says she just picked up her first pack of Boost/Ensure supplements to help her regain weight. She does like these. Will continue while admitted.   She denies n/v/c/d.   Patients current weight appears to be her UBW. She believes she has lost 5-10 lbs recently. Pt has lost ~ 20 lbs in 10 months, which she relates to her initial cancer dx. Remarkably, she was able to maintain weight throughout her cancer treatment.   NFPE: Appears WDL  Labs reviewed: aki   Recent Labs Lab 05/24/15 2005 05/25/15 0616  NA 138 138  K 4.4 4.2  CL 105 108  CO2 24 25  BUN 46* 37*  CREATININE 1.66* 1.33*  CALCIUM 9.3 8.6*  GLUCOSE 105* 111*   Diet Order:  DIET SOFT  Room service appropriate?: Yes; Fluid consistency:: Thin  Skin: Dry, Rash to face and arms  Last BM:  05/24/2015  Height:  Ht Readings from Last 1 Encounters:  05/25/15 '5\' 1"'$  (1.549 m)   Weight:  Wt Readings from Last 1 Encounters:  05/25/15 121 lb 8 oz (55.112 kg)   Wt Readings from Last 10 Encounters:  05/25/15 121 lb 8 oz (55.112 kg)  05/05/15 129 lb 8 oz (58.741 kg)  05/03/15 120 lb (54.432 kg)  04/10/15 124 lb 9.6 oz (56.518 kg)  02/18/15 124 lb (56.246 kg)  02/02/15 124 lb (56.246 kg)  01/29/15 124 lb 4.8 oz (56.382 kg)  12/30/14 119 lb 14.4 oz (54.386 kg)  12/30/14 119 lb 8 oz (54.205 kg)  12/10/14 119 lb 1.6 oz (54.023 kg)   Ideal Body Weight:  47.7 kg  BMI:  Body mass index is 22.97 kg/(m^2).  Estimated Nutritional Needs:  Kcal:  1400-1650 (25-30 kcal/kg bw) Protein:  55-66 g Pro (1-1.2 g/kg bw) Fluid:  1.4-1.6 liters  EDUCATION NEEDS:  No education needs identified at this time  Burtis Junes RD, LDN Clinical Nutrition Pager: [phone] 05/25/2015 1:46 PM

## 2015-05-25 NOTE — Progress Notes (Signed)
Subjective: Patient was admitted yesterday due to shortness of breath and generalized weakness. She is dehydrated and also has UTI. Patient is being hydrated and is on IV antibiotics.  Objective: Vital signs in last 24 hours: Temp:  [98.1 F (36.7 C)-98.6 F (37 C)] 98.1 F (36.7 C) (05/02 0512) Pulse Rate:  [100-116] 100 (05/02 0512) Resp:  [14-20] 18 (05/02 0512) BP: (143-157)/(72-91) 151/72 mmHg (05/02 0512) SpO2:  [97 %-100 %] 100 % (05/02 0512) Weight:  [55.112 kg (121 lb 8 oz)-55.339 kg (122 lb)] 55.112 kg (121 lb 8 oz) (05/02 0000) Weight change:  Last BM Date: 05/24/15  Intake/Output from previous day:    PHYSICAL EXAM General appearance: alert and no distress Resp: diminished breath sounds bilaterally and rhonchi bilaterally Cardio: S1, S2 normal GI: soft, non-tender; bowel sounds normal; no masses,  no organomegaly Extremities: extremities normal, atraumatic, no cyanosis or edema  Lab Results:  Results for orders placed or performed during the hospital encounter of 05/24/15 (from the past 48 hour(s))  Urinalysis, Routine w reflex microscopic (not at [facility])     Status: Abnormal   Collection Time: 05/24/15  7:30 PM  Result Value Ref Range   Color, Urine YELLOW YELLOW   APPearance CLEAR CLEAR   Specific Gravity, Urine >1.030 (H) 1.005 - 1.030   pH 5.0 5.0 - 8.0   Glucose, UA NEGATIVE NEGATIVE mg/dL   Hgb urine dipstick NEGATIVE NEGATIVE   Bilirubin Urine NEGATIVE NEGATIVE   Ketones, ur NEGATIVE NEGATIVE mg/dL   Protein, ur TRACE (A) NEGATIVE mg/dL   Nitrite NEGATIVE NEGATIVE   Leukocytes, UA TRACE (A) NEGATIVE  Urine microscopic-add on     Status: Abnormal   Collection Time: 05/24/15  7:30 PM  Result Value Ref Range   Squamous Epithelial / LPF 6-30 (A) NONE SEEN   WBC, UA TOO NUMEROUS TO COUNT 0 - 5 WBC/hpf   RBC / HPF NONE SEEN 0 - 5 RBC/hpf   Bacteria, UA MANY (A) NONE SEEN   Casts CELLULAR CASTS NEGATIVE  TSH     Status: None   Collection Time: 05/24/15   7:57 PM  Result Value Ref Range   TSH 3.281 0.350 - 4.500 uIU/mL  CBC with Differential     Status: Abnormal   Collection Time: 05/24/15  8:05 PM  Result Value Ref Range   WBC 9.6 4.0 - 10.5 K/uL   RBC 3.94 3.87 - 5.11 MIL/uL   Hemoglobin 12.0 12.0 - 15.0 g/dL   HCT 35.6 (L) 36.0 - 46.0 %   MCV 90.4 78.0 - 100.0 fL   MCH 30.5 26.0 - 34.0 pg   MCHC 33.7 30.0 - 36.0 g/dL   RDW 18.1 (H) 11.5 - 15.5 %   Platelets 190 150 - 400 K/uL   Neutrophils Relative % 77 %   Neutro Abs 7.4 1.7 - 7.7 K/uL   Lymphocytes Relative 14 %   Lymphs Abs 1.3 0.7 - 4.0 K/uL   Monocytes Relative 7 %   Monocytes Absolute 0.7 0.1 - 1.0 K/uL   Eosinophils Relative 2 %   Eosinophils Absolute 0.2 0.0 - 0.7 K/uL   Basophils Relative 0 %   Basophils Absolute 0.0 0.0 - 0.1 K/uL  Basic metabolic panel     Status: Abnormal   Collection Time: 05/24/15  8:05 PM  Result Value Ref Range   Sodium 138 135 - 145 mmol/L   Potassium 4.4 3.5 - 5.1 mmol/L   Chloride 105 101 - 111 mmol/L   CO2  24 22 - 32 mmol/L   Glucose, Bld 105 (H) 65 - 99 mg/dL   BUN 46 (H) 6 - 20 mg/dL   Creatinine, Ser 1.66 (H) 0.44 - 1.00 mg/dL   Calcium 9.3 8.9 - 10.3 mg/dL   GFR calc non Af Amer 33 (L) >60 mL/min   GFR calc Af Amer 38 (L) >60 mL/min    Comment: (NOTE) The eGFR has been calculated using the CKD EPI equation. This calculation has not been validated in all clinical situations. eGFR's persistently <60 mL/min signify possible Chronic Kidney Disease.    Anion gap 9 5 - 15  Basic metabolic panel     Status: Abnormal   Collection Time: 05/25/15  6:16 AM  Result Value Ref Range   Sodium 138 135 - 145 mmol/L   Potassium 4.2 3.5 - 5.1 mmol/L   Chloride 108 101 - 111 mmol/L   CO2 25 22 - 32 mmol/L   Glucose, Bld 111 (H) 65 - 99 mg/dL   BUN 37 (H) 6 - 20 mg/dL   Creatinine, Ser 1.33 (H) 0.44 - 1.00 mg/dL   Calcium 8.6 (L) 8.9 - 10.3 mg/dL   GFR calc non Af Amer 42 (L) >60 mL/min   GFR calc Af Amer 49 (L) >60 mL/min    Comment:  (NOTE) The eGFR has been calculated using the CKD EPI equation. This calculation has not been validated in all clinical situations. eGFR's persistently <60 mL/min signify possible Chronic Kidney Disease.    Anion gap 5 5 - 15  CBC     Status: Abnormal   Collection Time: 05/25/15  6:16 AM  Result Value Ref Range   WBC 7.8 4.0 - 10.5 K/uL   RBC 3.60 (L) 3.87 - 5.11 MIL/uL   Hemoglobin 10.7 (L) 12.0 - 15.0 g/dL   HCT 32.9 (L) 36.0 - 46.0 %   MCV 91.4 78.0 - 100.0 fL   MCH 29.7 26.0 - 34.0 pg   MCHC 32.5 30.0 - 36.0 g/dL   RDW 17.9 (H) 11.5 - 15.5 %   Platelets 174 150 - 400 K/uL    ABGS No results for input(s): PHART, PO2ART, TCO2, HCO3 in the last 72 hours.  Invalid input(s): PCO2 CULTURES No results found for this or any previous visit (from the past 240 hour(s)). Studies/Results: Dg Chest 2 View  05/24/2015  CLINICAL DATA:  Shortness of breath for 2 weeks. History of lung carcinoma EXAM: CHEST  2 VIEW COMPARISON:  Chest radiograph and chest CT May 03, 2015 FINDINGS: There is scarring right upper lobe, stable. There is no frank edema or consolidation. Heart size and pulmonary vascularity are normal. No adenopathy. Port-A-Cath tip is in the superior vena cava. No pneumothorax. No bone lesions are evident. There is degenerative change in the thoracic spine. 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 IMPRESSION: Scarring right upper lobe. No edema or consolidation. 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 Electronically Signed   By: Lowella Grip III M.D.   On: 05/24/2015 19:38    Medications: I have reviewed the patient's current medications.  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

## 2015-05-26 LAB — BASIC METABOLIC PANEL
Anion gap: 6 (ref 5–15)
BUN: 22 mg/dL — ABNORMAL HIGH (ref 6–20)
CALCIUM: 8.5 mg/dL — AB (ref 8.9–10.3)
CO2: 25 mmol/L (ref 22–32)
CREATININE: 0.91 mg/dL (ref 0.44–1.00)
Chloride: 111 mmol/L (ref 101–111)
GFR calc non Af Amer: >60 mL/min (ref >60)
Glucose, Bld: 101 mg/dL — ABNORMAL HIGH (ref 65–99)
Potassium: 4.1 mmol/L (ref 3.5–5.1)
SODIUM: 142 mmol/L (ref 135–145)

## 2015-05-26 MED ORDER — CIPROFLOXACIN IN D5W 400 MG/200ML IV SOLN
400.0000 mg | Freq: Two times a day (BID) | INTRAVENOUS | Status: DC
  Administered 2015-05-26 – 2015-05-28 (×5): 400 mg via INTRAVENOUS
  Filled 2015-05-26 (×5): qty 200

## 2015-05-26 MED ORDER — HYDROXYZINE HCL 25 MG PO TABS
25.0000 mg | ORAL_TABLET | Freq: Three times a day (TID) | ORAL | Status: DC
  Administered 2015-05-26 – 2015-06-01 (×19): 25 mg via ORAL
  Filled 2015-05-26 (×20): qty 1

## 2015-05-26 MED ORDER — HYDROXYZINE HCL 10 MG/5ML PO SYRP: 25.0000 mg | ORAL_SOLUTION | Freq: Three times a day (TID) | ORAL | Status: DC

## 2015-05-26 MED ORDER — METHYLPREDNISOLONE SODIUM SUCC 125 MG IJ SOLR
125.0000 mg | Freq: Once | INTRAMUSCULAR | Status: AC
  Administered 2015-05-26: 125 mg via INTRAVENOUS
  Filled 2015-05-26: qty 2

## 2015-05-26 NOTE — Care Management Note (Signed)
Case Management Note  Patient Details  Name: Megan Sims MRN: [id number] Date of Birth: 10/23/1955  Expected Discharge Date:   05/27/2015               Expected Discharge Plan:  Home/Self Care  In-House Referral:  NA  Discharge planning Services  CM Consult  Post Acute Care Choice:  NA Choice offered to:  NA  DME Arranged:    DME Agency:     HH Arranged:    [place] Agency:     Status of Service:  Completed, signed off  Medicare Important Message Given:    Date Medicare IM Given:    Medicare IM give by:    Date Additional Medicare IM Given:    Additional Medicare Important Message give by:     If discussed at [place] of Stay Meetings, dates discussed:    Additional Comments: Pt's son has requested assistance finding new PCP. Pt given list of providers in [place] who are accepting new pts. Pt made aware it is her repsponsibility to contact MD offices and find PCP.   Sherald Barge, RN 05/26/2015, 1:05 PM

## 2015-05-26 NOTE — Progress Notes (Signed)
Subjective: Patient has developed itching rash all over her body including her face. There is swelling around her ears and eyes. No fever or chills..  Objective: Vital signs in last 24 hours: Temp:  [98 F (36.7 C)-98.5 F (36.9 C)] 98.5 F (36.9 C) (05/03 0554) Pulse Rate:  [84-99] 88 (05/03 0554) Resp:  [18-20] 20 (05/03 0554) BP: (112-118)/(45-63) 116/60 mmHg (05/03 0554) SpO2:  [97 %-100 %] 97 % (05/03 0554) Weight change:  Last BM Date: 05/24/15  Intake/Output from previous day: 05/02 0701 - 05/03 0700 In: 1743.3 [P.O.:360; I.V.:1383.3] Out: -   PHYSICAL EXAM General appearance: alert and no distress Resp: diminished breath sounds bilaterally and rhonchi bilaterally Cardio: S1, S2 normal GI: soft, non-tender; bowel sounds normal; no masses,  no organomegaly Extremities: extremities normal, atraumatic, no cyanosis or edema  Lab Results:  Results for orders placed or performed during the hospital encounter of 05/24/15 (from the past 48 hour(s))  Urinalysis, Routine w reflex microscopic (not at [facility])     Status: Abnormal   Collection Time: 05/24/15  7:30 PM  Result Value Ref Range   Color, Urine YELLOW YELLOW   APPearance CLEAR CLEAR   Specific Gravity, Urine >1.030 (H) 1.005 - 1.030   pH 5.0 5.0 - 8.0   Glucose, UA NEGATIVE NEGATIVE mg/dL   Hgb urine dipstick NEGATIVE NEGATIVE   Bilirubin Urine NEGATIVE NEGATIVE   Ketones, ur NEGATIVE NEGATIVE mg/dL   Protein, ur TRACE (A) NEGATIVE mg/dL   Nitrite NEGATIVE NEGATIVE   Leukocytes, UA TRACE (A) NEGATIVE  Urine microscopic-add on     Status: Abnormal   Collection Time: 05/24/15  7:30 PM  Result Value Ref Range   Squamous Epithelial / LPF 6-30 (A) NONE SEEN   WBC, UA TOO NUMEROUS TO COUNT 0 - 5 WBC/hpf   RBC / HPF NONE SEEN 0 - 5 RBC/hpf   Bacteria, UA MANY (A) NONE SEEN   Casts CELLULAR CASTS NEGATIVE  TSH     Status: None   Collection Time: 05/24/15  7:57 PM  Result Value Ref Range   TSH 3.281 0.350 - 4.500  uIU/mL  CBC with Differential     Status: Abnormal   Collection Time: 05/24/15  8:05 PM  Result Value Ref Range   WBC 9.6 4.0 - 10.5 K/uL   RBC 3.94 3.87 - 5.11 MIL/uL   Hemoglobin 12.0 12.0 - 15.0 g/dL   HCT 35.6 (L) 36.0 - 46.0 %   MCV 90.4 78.0 - 100.0 fL   MCH 30.5 26.0 - 34.0 pg   MCHC 33.7 30.0 - 36.0 g/dL   RDW 18.1 (H) 11.5 - 15.5 %   Platelets 190 150 - 400 K/uL   Neutrophils Relative % 77 %   Neutro Abs 7.4 1.7 - 7.7 K/uL   Lymphocytes Relative 14 %   Lymphs Abs 1.3 0.7 - 4.0 K/uL   Monocytes Relative 7 %   Monocytes Absolute 0.7 0.1 - 1.0 K/uL   Eosinophils Relative 2 %   Eosinophils Absolute 0.2 0.0 - 0.7 K/uL   Basophils Relative 0 %   Basophils Absolute 0.0 0.0 - 0.1 K/uL  Basic metabolic panel     Status: Abnormal   Collection Time: 05/24/15  8:05 PM  Result Value Ref Range   Sodium 138 135 - 145 mmol/L   Potassium 4.4 3.5 - 5.1 mmol/L   Chloride 105 101 - 111 mmol/L   CO2 24 22 - 32 mmol/L   Glucose, Bld 105 (H) 65 -  99 mg/dL   BUN 46 (H) 6 - 20 mg/dL   Creatinine, Ser 1.66 (H) 0.44 - 1.00 mg/dL   Calcium 9.3 8.9 - 10.3 mg/dL   GFR calc non Af Amer 33 (L) >60 mL/min   GFR calc Af Amer 38 (L) >60 mL/min    Comment: (NOTE) The eGFR has been calculated using the CKD EPI equation. This calculation has not been validated in all clinical situations. eGFR's persistently <60 mL/min signify possible Chronic Kidney Disease.    Anion gap 9 5 - 15  Basic metabolic panel     Status: Abnormal   Collection Time: 05/25/15  6:16 AM  Result Value Ref Range   Sodium 138 135 - 145 mmol/L   Potassium 4.2 3.5 - 5.1 mmol/L   Chloride 108 101 - 111 mmol/L   CO2 25 22 - 32 mmol/L   Glucose, Bld 111 (H) 65 - 99 mg/dL   BUN 37 (H) 6 - 20 mg/dL   Creatinine, Ser 1.33 (H) 0.44 - 1.00 mg/dL   Calcium 8.6 (L) 8.9 - 10.3 mg/dL   GFR calc non Af Amer 42 (L) >60 mL/min   GFR calc Af Amer 49 (L) >60 mL/min    Comment: (NOTE) The eGFR has been calculated using the CKD EPI  equation. This calculation has not been validated in all clinical situations. eGFR's persistently <60 mL/min signify possible Chronic Kidney Disease.    Anion gap 5 5 - 15  CBC     Status: Abnormal   Collection Time: 05/25/15  6:16 AM  Result Value Ref Range   WBC 7.8 4.0 - 10.5 K/uL   RBC 3.60 (L) 3.87 - 5.11 MIL/uL   Hemoglobin 10.7 (L) 12.0 - 15.0 g/dL   HCT 32.9 (L) 36.0 - 46.0 %   MCV 91.4 78.0 - 100.0 fL   MCH 29.7 26.0 - 34.0 pg   MCHC 32.5 30.0 - 36.0 g/dL   RDW 17.9 (H) 11.5 - 15.5 %   Platelets 174 150 - 400 K/uL  Basic metabolic panel     Status: Abnormal   Collection Time: 05/26/15  6:08 AM  Result Value Ref Range   Sodium 142 135 - 145 mmol/L   Potassium 4.1 3.5 - 5.1 mmol/L   Chloride 111 101 - 111 mmol/L   CO2 25 22 - 32 mmol/L   Glucose, Bld 101 (H) 65 - 99 mg/dL   BUN 22 (H) 6 - 20 mg/dL   Creatinine, Ser 0.91 0.44 - 1.00 mg/dL   Calcium 8.5 (L) 8.9 - 10.3 mg/dL   GFR calc non Af Amer >60 >60 mL/min   GFR calc Af Amer >60 >60 mL/min    Comment: (NOTE) The eGFR has been calculated using the CKD EPI equation. This calculation has not been validated in all clinical situations. eGFR's persistently <60 mL/min signify possible Chronic Kidney Disease.    Anion gap 6 5 - 15    ABGS No results for input(s): PHART, PO2ART, TCO2, HCO3 in the last 72 hours.  Invalid input(s): PCO2 CULTURES No results found for this or any previous visit (from the past 240 hour(s)). Studies/Results: Dg Chest 2 View  05/24/2015  CLINICAL DATA:  Shortness of breath for 2 weeks. History of lung carcinoma EXAM: CHEST  2 VIEW COMPARISON:  Chest radiograph and chest CT May 03, 2015 FINDINGS: There is scarring right upper lobe, stable. There is no frank edema or consolidation. Heart size and pulmonary vascularity are normal. No adenopathy. Port-A-Cath tip  is in the superior vena cava. No pneumothorax. No bone lesions are evident. There is degenerative change in the thoracic spine. 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 IMPRESSION: Scarring right upper lobe. No edema or consolidation. 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 Electronically Signed   By: Lowella Grip III M.D.   On: 05/24/2015 19:38    Medications: I have reviewed the patient's current medications.  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 rocephin   Plan:  Medications reviewed Will continue IV fluid Will d/c Rocephin and start cipro 500 mg IV BID Will give dose of solumedrol Dermatology consult Will monitor BMP    LOS: 1 day   Delania Ferg 05/26/2015, 9:01 AM

## 2015-05-27 LAB — BASIC METABOLIC PANEL
ANION GAP: 7 (ref 5–15)
BUN: 19 mg/dL (ref 6–20)
CHLORIDE: 107 mmol/L (ref 101–111)
CO2: 24 mmol/L (ref 22–32)
Calcium: 8.3 mg/dL — ABNORMAL LOW (ref 8.9–10.3)
Creatinine, Ser: 0.8 mg/dL (ref 0.44–1.00)
GFR calc non Af Amer: >60 mL/min (ref >60)
GLUCOSE: 140 mg/dL — AB (ref 65–99)
Potassium: 3.7 mmol/L (ref 3.5–5.1)
Sodium: 138 mmol/L (ref 135–145)

## 2015-05-27 LAB — URINE CULTURE

## 2015-05-27 MED ORDER — ONDANSETRON HCL 4 MG/2ML IJ SOLN
4.0000 mg | Freq: Four times a day (QID) | INTRAMUSCULAR | Status: DC | PRN
  Administered 2015-05-27: 4 mg via INTRAVENOUS
  Filled 2015-05-27: qty 2

## 2015-05-27 NOTE — Progress Notes (Signed)
Subjective: Patient feels very weak and has no energy. She has difficulty to swallow due to sores in her mouth. The rash is slightly better, but still swollen around her eye lids. Dermatology consult is pending.  Objective: Vital signs in last 24 hours: Temp:  [98.4 F (36.9 C)-98.8 F (37.1 C)] 98.4 F (36.9 C) (05/03 2213) Pulse Rate:  [96-98] 98 (05/03 2213) Resp:  [20] 20 (05/03 2213) BP: (125-146)/(57-64) 146/64 mmHg (05/03 2213) SpO2:  [95 %] 95 % (05/03 2213) Weight change:  Last BM Date: 05/24/15  Intake/Output from previous day: 05/03 0701 - 05/04 0700 In: 1680 [P.O.:480; I.V.:1200] Out: 250 [Urine:250]  PHYSICAL EXAM General appearance: alert and no distress Resp: diminished breath sounds bilaterally and rhonchi bilaterally Cardio: S1, S2 normal GI: soft, non-tender; bowel sounds normal; no masses,  no organomegaly Extremities: extremities normal, atraumatic, no cyanosis or edema  Lab Results:  Results for orders placed or performed during the hospital encounter of 05/24/15 (from the past 48 hour(s))  Basic metabolic panel     Status: Abnormal   Collection Time: 05/26/15  6:08 AM  Result Value Ref Range   Sodium 142 135 - 145 mmol/L   Potassium 4.1 3.5 - 5.1 mmol/L   Chloride 111 101 - 111 mmol/L   CO2 25 22 - 32 mmol/L   Glucose, Bld 101 (H) 65 - 99 mg/dL   BUN 22 (H) 6 - 20 mg/dL   Creatinine, Ser 0.91 0.44 - 1.00 mg/dL   Calcium 8.5 (L) 8.9 - 10.3 mg/dL   GFR calc non Af Amer >60 >60 mL/min   GFR calc Af Amer >60 >60 mL/min    Comment: (NOTE) The eGFR has been calculated using the CKD EPI equation. This calculation has not been validated in all clinical situations. eGFR's persistently <60 mL/min signify possible Chronic Kidney Disease.    Anion gap 6 5 - 15  Basic metabolic panel     Status: Abnormal   Collection Time: 05/27/15  6:16 AM  Result Value Ref Range   Sodium 138 135 - 145 mmol/L   Potassium 3.7 3.5 - 5.1 mmol/L   Chloride 107 101 - 111  mmol/L   CO2 24 22 - 32 mmol/L   Glucose, Bld 140 (H) 65 - 99 mg/dL   BUN 19 6 - 20 mg/dL   Creatinine, Ser 0.80 0.44 - 1.00 mg/dL   Calcium 8.3 (L) 8.9 - 10.3 mg/dL   GFR calc non Af Amer >60 >60 mL/min   GFR calc Af Amer >60 >60 mL/min    Comment: (NOTE) The eGFR has been calculated using the CKD EPI equation. This calculation has not been validated in all clinical situations. eGFR's persistently <60 mL/min signify possible Chronic Kidney Disease.    Anion gap 7 5 - 15    ABGS No results for input(s): PHART, PO2ART, TCO2, HCO3 in the last 72 hours.  Invalid input(s): PCO2 CULTURES Recent Results (from the past 240 hour(s))  Urine culture     Status: None (Preliminary result)   Collection Time: 05/24/15  7:30 PM  Result Value Ref Range Status   Specimen Description URINE, CLEAN CATCH  Final   Special Requests NONE  Final   Culture   Final    TOO YOUNG TO READ Performed at [facility]    Report Status PENDING  Incomplete   Studies/Results: No results found.  Medications: I have reviewed the patient's current medications.  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 rocephin   Plan:  Medications reviewed Will continue IV fluid Continue IV cipro Dermatology consult pending Will monitor BMP/ cbc    LOS: 2 days   Nitisha Civello 05/27/2015, 8:04 AM

## 2015-05-28 LAB — CBC
HEMATOCRIT: 32.6 % — AB (ref 36.0–46.0)
HEMOGLOBIN: 10.9 g/dL — AB (ref 12.0–15.0)
MCH: 31 pg (ref 26.0–34.0)
MCHC: 33.4 g/dL (ref 30.0–36.0)
MCV: 92.6 fL (ref 78.0–100.0)
Platelets: 234 10*3/uL (ref 150–400)
RBC: 3.52 MIL/uL — ABNORMAL LOW (ref 3.87–5.11)
RDW: 19.1 % — ABNORMAL HIGH (ref 11.5–15.5)
WBC: 9.4 10*3/uL (ref 4.0–10.5)

## 2015-05-28 LAB — BASIC METABOLIC PANEL
Anion gap: 6 (ref 5–15)
BUN: 14 mg/dL (ref 6–20)
CHLORIDE: 107 mmol/L (ref 101–111)
CO2: 26 mmol/L (ref 22–32)
CREATININE: 0.86 mg/dL (ref 0.44–1.00)
Calcium: 8.3 mg/dL — ABNORMAL LOW (ref 8.9–10.3)
GFR calc Af Amer: >60 mL/min (ref >60)
GFR calc non Af Amer: >60 mL/min (ref >60)
Glucose, Bld: 97 mg/dL (ref 65–99)
Potassium: 3.6 mmol/L (ref 3.5–5.1)
Sodium: 139 mmol/L (ref 135–145)

## 2015-05-28 MED ORDER — TRAZODONE HCL 50 MG PO TABS
50.0000 mg | ORAL_TABLET | Freq: Every evening | ORAL | Status: DC | PRN
  Administered 2015-05-28: 50 mg via ORAL
  Filled 2015-05-28: qty 1

## 2015-05-28 MED ORDER — CIPROFLOXACIN HCL 250 MG PO TABS
500.0000 mg | ORAL_TABLET | Freq: Two times a day (BID) | ORAL | Status: DC
  Administered 2015-05-28 – 2015-06-01 (×8): 500 mg via ORAL
  Filled 2015-05-28 (×8): qty 2

## 2015-05-28 MED ORDER — METHYLPREDNISOLONE SODIUM SUCC 40 MG IJ SOLR
40.0000 mg | Freq: Two times a day (BID) | INTRAMUSCULAR | Status: DC
  Administered 2015-05-28 – 2015-05-29 (×3): 40 mg via INTRAVENOUS
  Filled 2015-05-28 (×3): qty 1

## 2015-05-28 NOTE — Progress Notes (Signed)
PHARMACIST - PHYSICIAN COMMUNICATION DR:   Legrand Rams CONCERNING: Antibiotic IV to Oral Route Change Policy  RECOMMENDATION: This patient is receiving CIPRO by the intravenous route.  Based on criteria approved by the Pharmacy and Therapeutics Committee, the antibiotic(s) is/are being converted to the equivalent oral dose form(s).   DESCRIPTION: These criteria include:  Patient being treated for a respiratory tract infection, urinary tract infection, cellulitis or clostridium difficile associated diarrhea if on metronidazole  The patient is not neutropenic and does not exhibit a GI malabsorption state  The patient is eating (either orally or via tube) and/or has been taking other orally administered medications for a least 24 hours  The patient is improving clinically and has a Tmax < 100.5  If you have questions about this conversion, please contact the Pharmacy Department  '[x]'$   [phone] )  [facility] '[]'$   [phone] )  [facility] '[]'$   [phone] )  [facility] '[]'$   [phone] )  [facility] '[]'$   [phone] )  [place], PharmD Clinical Pharmacist Pager:  [phone] 05/28/2015 1:39 PM

## 2015-05-28 NOTE — Progress Notes (Signed)
Subjective: She says she feels a little bit better. She still has significant rash. This is thought to be related to Rocephin. She says otherwise she feels a little better.  Objective: Vital signs in last 24 hours: Temp:  [98.2 F (36.8 C)-98.6 F (37 C)] 98.2 F (36.8 C) (05/05 0558) Pulse Rate:  [94-109] 94 (05/05 0558) Resp:  [18-20] 18 (05/05 0558) BP: (123-159)/(63-94) 159/94 mmHg (05/05 0558) SpO2:  [96 %-98 %] 98 % (05/05 0558) Weight change:  Last BM Date: 05/24/15  Intake/Output from previous day: 05/04 0701 - 05/05 0700 In: 180 [P.O.:180] Out: -   PHYSICAL EXAM General appearance: alert, cooperative, mild distress and She still has significant rash on her face and chest Resp: clear to auscultation bilaterally Cardio: regular rate and rhythm, S1, S2 normal, no murmur, click, rub or gallop GI: soft, non-tender; bowel sounds normal; no masses,  no organomegaly Extremities: extremities normal, atraumatic, no cyanosis or edema  Lab Results:  Results for orders placed or performed during the hospital encounter of 05/24/15 (from the past 48 hour(s))  Basic metabolic panel     Status: Abnormal   Collection Time: 05/27/15  6:16 AM  Result Value Ref Range   Sodium 138 135 - 145 mmol/Sims   Potassium 3.7 3.5 - 5.1 mmol/Sims   Chloride 107 101 - 111 mmol/Sims   CO2 24 22 - 32 mmol/Sims   Glucose, Bld 140 (H) 65 - 99 mg/dL   BUN 19 6 - 20 mg/dL   Creatinine, Ser 0.80 0.44 - 1.00 mg/dL   Calcium 8.3 (Sims) 8.9 - 10.3 mg/dL   GFR calc non Af Amer >60 >60 mL/min   GFR calc Af Amer >60 >60 mL/min    Comment: (NOTE) The eGFR has been calculated using the CKD EPI equation. This calculation has not been validated in all clinical situations. eGFR's persistently <60 mL/min signify possible Chronic Kidney Disease.    Anion gap 7 5 - 15  CBC     Status: Abnormal   Collection Time: 05/28/15  6:13 AM  Result Value Ref Range   WBC 9.4 4.0 - 10.5 K/uL   RBC 3.52 (Sims) 3.87 - 5.11 MIL/uL   Hemoglobin 10.9 (Sims) 12.0 - 15.0 g/dL   HCT 32.6 (Sims) 36.0 - 46.0 %   MCV 92.6 78.0 - 100.0 fL   MCH 31.0 26.0 - 34.0 pg   MCHC 33.4 30.0 - 36.0 g/dL   RDW 19.1 (H) 11.5 - 15.5 %   Platelets 234 150 - 400 K/uL  Basic metabolic panel     Status: Abnormal   Collection Time: 05/28/15  6:13 AM  Result Value Ref Range   Sodium 139 135 - 145 mmol/Sims   Potassium 3.6 3.5 - 5.1 mmol/Sims   Chloride 107 101 - 111 mmol/Sims   CO2 26 22 - 32 mmol/Sims   Glucose, Bld 97 65 - 99 mg/dL   BUN 14 6 - 20 mg/dL   Creatinine, Ser 0.86 0.44 - 1.00 mg/dL   Calcium 8.3 (Sims) 8.9 - 10.3 mg/dL   GFR calc non Af Amer >60 >60 mL/min   GFR calc Af Amer >60 >60 mL/min    Comment: (NOTE) The eGFR has been calculated using the CKD EPI equation. This calculation has not been validated in all clinical situations. eGFR's persistently <60 mL/min signify possible Chronic Kidney Disease.    Anion gap 6 5 - 15    ABGS No results for input(s): PHART, PO2ART, TCO2, HCO3 in the last  72 hours.  Invalid input(s): PCO2 CULTURES Recent Results (from the past 240 hour(s))  Urine culture     Status: Abnormal   Collection Time: 05/24/15  7:30 PM  Result Value Ref Range Status   Specimen Description URINE, CLEAN CATCH  Final   Special Requests NONE  Final   Culture MULTIPLE SPECIES PRESENT, SUGGEST RECOLLECTION (A)  Final   Report Status 05/27/2015 FINAL  Final   Studies/Results: No results found.  Medications:  Prior to Admission:  Prescriptions prior to admission  Medication Sig Dispense Refill Last Dose  . acetaminophen (TYLENOL) 500 MG tablet Take 500 mg by mouth every 6 (six) hours as needed for mild pain or moderate pain.   05/23/2015 at Unknown time  . amLODipine (NORVASC) 10 MG tablet Take 1 tablet (10 mg total) by mouth daily. 30 tablet 6 05/24/2015 at Unknown time  . EPINEPHrine 0.3 mg/0.3 mL IJ SOAJ injection Inject 0.3 mLs (0.3 mg total) into the muscle once. 1 Device 0 unknown  . ibuprofen (ADVIL,MOTRIN) 200 MG  tablet Take 200 mg by mouth 2 (two) times daily as needed for mild pain or moderate pain.   05/21/2015 at Unknown time  . chlorpheniramine-HYDROcodone (TUSSIONEX PENNKINETIC ER) 10-8 MG/5ML SUER Take 5 mLs by mouth every 12 (twelve) hours as needed for cough. (Patient not taking: Reported on 05/05/2015) 140 mL 0 Not Taking  . predniSONE (DELTASONE) 20 MG tablet Take 2 tablets (40 mg total) by mouth daily with breakfast. (Patient not taking: Reported on 05/24/2015) 14 tablet 0    Scheduled: . amLODipine  10 mg Oral Daily  . ciprofloxacin  400 mg Intravenous Q12H  . feeding supplement (ENSURE ENLIVE)  237 mL Oral BID BM  . hydrOXYzine  25 mg Oral TID  . methylPREDNISolone (SOLU-MEDROL) injection  40 mg Intravenous Q12H  . sucralfate  1 g Oral TID WC & HS   Continuous: . dextrose 5 % and 0.9% NaCl 50 mL/hr at 05/27/15 0620   RXV:[id number], ondansetron (ZOFRAN) IV, oxyCODONE-acetaminophen  Assesment:She was admitted with acute kidney injury and that seems to have resolved. She had a urinary tract infection and says that's doing better. She had pill esophagitis which is improved. She's having a significant allergic reaction to Rocephin. She has a history of cancer of the lung and is undergoing chemotherapy. 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 Continue other treatments.    LOS: 3 days   Megan Sims 05/28/2015, 9:41 AM

## 2015-05-29 MED ORDER — METHYLPREDNISOLONE SODIUM SUCC 40 MG IJ SOLR
40.0000 mg | Freq: Four times a day (QID) | INTRAMUSCULAR | Status: DC
  Administered 2015-05-29 – 2015-06-01 (×12): 40 mg via INTRAVENOUS
  Filled 2015-05-29 (×12): qty 1

## 2015-05-29 MED ORDER — ZOLPIDEM TARTRATE 5 MG PO TABS
5.0000 mg | ORAL_TABLET | Freq: Every evening | ORAL | Status: DC | PRN
  Administered 2015-05-29 – 2015-05-31 (×3): 5 mg via ORAL
  Filled 2015-05-29 (×3): qty 1

## 2015-05-29 NOTE — Progress Notes (Signed)
Subjective: She says she feels "terrible" this morning. Her appetite is poor. She is still having a lot of trouble with a rash. She feels very weak.  Objective: Vital signs in last 24 hours: Temp:  [97.6 F (36.4 C)-98.4 F (36.9 C)] 97.9 F (36.6 C) (05/06 0547) Pulse Rate:  [96-113] 101 (05/06 0547) Resp:  [20] 20 (05/06 0547) BP: (132-154)/(69-85) 150/81 mmHg (05/06 0547) SpO2:  [97 %-100 %] 100 % (05/06 0547) Weight change:  Last BM Date: 05/27/15  Intake/Output from previous day:    PHYSICAL EXAM General appearance: alert, cooperative and Erythematous inflammatory rash on her face and arms Resp: clear to auscultation bilaterally Cardio: regular rate and rhythm, S1, S2 normal, no murmur, click, rub or gallop GI: soft, non-tender; bowel sounds normal; no masses,  no organomegaly Extremities: extremities normal, atraumatic, no cyanosis or edema  Lab Results:  Results for orders placed or performed during the hospital encounter of 05/24/15 (from the past 48 hour(s))  CBC     Status: Abnormal   Collection Time: 05/28/15  6:13 AM  Result Value Ref Range   WBC 9.4 4.0 - 10.5 K/uL   RBC 3.52 (Sims) 3.87 - 5.11 MIL/uL   Hemoglobin 10.9 (Sims) 12.0 - 15.0 g/dL   HCT 32.6 (Sims) 36.0 - 46.0 %   MCV 92.6 78.0 - 100.0 fL   MCH 31.0 26.0 - 34.0 pg   MCHC 33.4 30.0 - 36.0 g/dL   RDW 19.1 (H) 11.5 - 15.5 %   Platelets 234 150 - 400 K/uL  Basic metabolic panel     Status: Abnormal   Collection Time: 05/28/15  6:13 AM  Result Value Ref Range   Sodium 139 135 - 145 mmol/Sims   Potassium 3.6 3.5 - 5.1 mmol/Sims   Chloride 107 101 - 111 mmol/Sims   CO2 26 22 - 32 mmol/Sims   Glucose, Bld 97 65 - 99 mg/dL   BUN 14 6 - 20 mg/dL   Creatinine, Ser 0.86 0.44 - 1.00 mg/dL   Calcium 8.3 (Sims) 8.9 - 10.3 mg/dL   GFR calc non Af Amer >60 >60 mL/min   GFR calc Af Amer >60 >60 mL/min    Comment: (NOTE) The eGFR has been calculated using the CKD EPI equation. This calculation has not been validated in all  clinical situations. eGFR's persistently <60 mL/min signify possible Chronic Kidney Disease.    Anion gap 6 5 - 15    ABGS No results for input(s): PHART, PO2ART, TCO2, HCO3 in the last 72 hours.  Invalid input(s): PCO2 CULTURES Recent Results (from the past 240 hour(s))  Urine culture     Status: Abnormal   Collection Time: 05/24/15  7:30 PM  Result Value Ref Range Status   Specimen Description URINE, CLEAN CATCH  Final   Special Requests NONE  Final   Culture MULTIPLE SPECIES PRESENT, SUGGEST RECOLLECTION (A)  Final   Report Status 05/27/2015 FINAL  Final   Studies/Results: No results found.  Medications:  Prior to Admission:  Prescriptions prior to admission  Medication Sig Dispense Refill Last Dose  . acetaminophen (TYLENOL) 500 MG tablet Take 500 mg by mouth every 6 (six) hours as needed for mild pain or moderate pain.   05/23/2015 at Unknown time  . amLODipine (NORVASC) 10 MG tablet Take 1 tablet (10 mg total) by mouth daily. 30 tablet 6 05/24/2015 at Unknown time  . EPINEPHrine 0.3 mg/0.3 mL IJ SOAJ injection Inject 0.3 mLs (0.3 mg total) into the muscle once.  1 Device 0 unknown  . ibuprofen (ADVIL,MOTRIN) 200 MG tablet Take 200 mg by mouth 2 (two) times daily as needed for mild pain or moderate pain.   05/21/2015 at Unknown time  . chlorpheniramine-HYDROcodone (TUSSIONEX PENNKINETIC ER) 10-8 MG/5ML SUER Take 5 mLs by mouth every 12 (twelve) hours as needed for cough. (Patient not taking: Reported on 05/05/2015) 140 mL 0 Not Taking  . predniSONE (DELTASONE) 20 MG tablet Take 2 tablets (40 mg total) by mouth daily with breakfast. (Patient not taking: Reported on 05/24/2015) 14 tablet 0    Scheduled: . amLODipine  10 mg Oral Daily  . ciprofloxacin  500 mg Oral BID  . feeding supplement (ENSURE ENLIVE)  237 mL Oral BID BM  . hydrOXYzine  25 mg Oral TID  . methylPREDNISolone (SOLU-MEDROL) injection  40 mg Intravenous Q6H  . sucralfate  1 g Oral TID WC & HS   Continuous:   SHF:[id number], ondansetron (ZOFRAN) IV, oxyCODONE-acetaminophen, zolpidem  Assesment: She was admitted with urinary tract infection and acute kidney injury. 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 She is on IV steroids. She is very weak. 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 She's not ready for discharge. She may need physical therapy consultation and I have requested that. Continue other treatments.    LOS: 4 days   Megan Sims 05/29/2015, 11:10 AM

## 2015-05-30 LAB — BASIC METABOLIC PANEL
Anion gap: 9 (ref 5–15)
BUN: 30 mg/dL — AB (ref 6–20)
CHLORIDE: 106 mmol/L (ref 101–111)
CO2: 25 mmol/L (ref 22–32)
CREATININE: 0.93 mg/dL (ref 0.44–1.00)
Calcium: 9 mg/dL (ref 8.9–10.3)
GFR calc Af Amer: >60 mL/min (ref >60)
GFR calc non Af Amer: >60 mL/min (ref >60)
GLUCOSE: 139 mg/dL — AB (ref 65–99)
Potassium: 3.9 mmol/L (ref 3.5–5.1)
Sodium: 140 mmol/L (ref 135–145)

## 2015-05-30 LAB — CBC WITH DIFFERENTIAL/PLATELET
Basophils Absolute: 0 10*3/uL (ref 0.0–0.1)
Basophils Relative: 0 %
Eosinophils Absolute: 0 10*3/uL (ref 0.0–0.7)
Eosinophils Relative: 0 %
HEMATOCRIT: 36.3 % (ref 36.0–46.0)
HEMOGLOBIN: 12.2 g/dL (ref 12.0–15.0)
LYMPHS ABS: 1.3 10*3/uL (ref 0.7–4.0)
Lymphocytes Relative: 15 %
MCH: 30.6 pg (ref 26.0–34.0)
MCHC: 33.6 g/dL (ref 30.0–36.0)
MCV: 91 fL (ref 78.0–100.0)
MONO ABS: 0.9 10*3/uL (ref 0.1–1.0)
MONOS PCT: 10 %
NEUTROS ABS: 6.6 10*3/uL (ref 1.7–7.7)
NEUTROS PCT: 75 %
Platelets: 294 10*3/uL (ref 150–400)
RBC: 3.99 MIL/uL (ref 3.87–5.11)
RDW: 19 % — AB (ref 11.5–15.5)
WBC: 8.8 10*3/uL (ref 4.0–10.5)

## 2015-05-30 MED ORDER — FLUOCINONIDE 0.05 % EX CREA
TOPICAL_CREAM | Freq: Two times a day (BID) | CUTANEOUS | Status: DC
  Administered 2015-05-30 – 2015-05-31 (×4): via TOPICAL
  Filled 2015-05-30: qty 30

## 2015-05-30 MED ORDER — DESOXIMETASONE 0.25 % EX CREA: TOPICAL_CREAM | Freq: Two times a day (BID) | CUTANEOUS | Status: DC

## 2015-05-30 NOTE — Progress Notes (Signed)
Subjective: She says she still feels bad. She has poor appetite. The rash is causing her trouble. She's had some nausea. Her breathing is pretty good but she is still coughing up some sputum  Objective: Vital signs in last 24 hours: Temp:  [97.8 F (36.6 C)-98.1 F (36.7 C)] 97.9 F (36.6 C) (05/07 0500) Pulse Rate:  [88-121] 88 (05/07 0500) Resp:  [18] 18 (05/07 0500) BP: (124-146)/(58-84) 146/71 mmHg (05/07 0500) SpO2:  [92 %-100 %] 92 % (05/07 0500) Weight change:  Last BM Date: 05/28/15  Intake/Output from previous day: 05/06 0701 - 05/07 0700 In: 360 [P.O.:360] Out: -   PHYSICAL EXAM General appearance: alert, cooperative and mild distress Resp: rhonchi bilaterally Cardio: regular rate and rhythm, S1, S2 normal, no murmur, click, rub or gallop GI: soft, non-tender; bowel sounds normal; no masses,  no organomegaly Extremities: extremities normal, atraumatic, no cyanosis or edema  Lab Results:  Results for orders placed or performed during the hospital encounter of 05/24/15 (from the past 48 hour(s))  CBC with Differential/Platelet     Status: Abnormal   Collection Time: 05/30/15  6:57 AM  Result Value Ref Range   WBC 8.8 4.0 - 10.5 K/uL   RBC 3.99 3.87 - 5.11 MIL/uL   Hemoglobin 12.2 12.0 - 15.0 g/dL   HCT 36.3 36.0 - 46.0 %   MCV 91.0 78.0 - 100.0 fL   MCH 30.6 26.0 - 34.0 pg   MCHC 33.6 30.0 - 36.0 g/dL   RDW 19.0 (H) 11.5 - 15.5 %   Platelets 294 150 - 400 K/uL   Neutrophils Relative % 75 %   Neutro Abs 6.6 1.7 - 7.7 K/uL   Lymphocytes Relative 15 %   Lymphs Abs 1.3 0.7 - 4.0 K/uL   Monocytes Relative 10 %   Monocytes Absolute 0.9 0.1 - 1.0 K/uL   Eosinophils Relative 0 %   Eosinophils Absolute 0.0 0.0 - 0.7 K/uL   Basophils Relative 0 %   Basophils Absolute 0.0 0.0 - 0.1 K/uL  Basic metabolic panel     Status: Abnormal   Collection Time: 05/30/15  6:57 AM  Result Value Ref Range   Sodium 140 135 - 145 mmol/L   Potassium 3.9 3.5 - 5.1 mmol/L   Chloride  106 101 - 111 mmol/L   CO2 25 22 - 32 mmol/L   Glucose, Bld 139 (H) 65 - 99 mg/dL   BUN 30 (H) 6 - 20 mg/dL   Creatinine, Ser 0.93 0.44 - 1.00 mg/dL   Calcium 9.0 8.9 - 10.3 mg/dL   GFR calc non Af Amer >60 >60 mL/min   GFR calc Af Amer >60 >60 mL/min    Comment: (NOTE) The eGFR has been calculated using the CKD EPI equation. This calculation has not been validated in all clinical situations. eGFR's persistently <60 mL/min signify possible Chronic Kidney Disease.    Anion gap 9 5 - 15    ABGS No results for input(s): PHART, PO2ART, TCO2, HCO3 in the last 72 hours.  Invalid input(s): PCO2 CULTURES Recent Results (from the past 240 hour(s))  Urine culture     Status: Abnormal   Collection Time: 05/24/15  7:30 PM  Result Value Ref Range Status   Specimen Description URINE, CLEAN CATCH  Final   Special Requests NONE  Final   Culture MULTIPLE SPECIES PRESENT, SUGGEST RECOLLECTION (A)  Final   Report Status 05/27/2015 FINAL  Final   Studies/Results: No results found.  Medications:  Prior to Admission:  Prescriptions prior to admission  Medication Sig Dispense Refill Last Dose  . acetaminophen (TYLENOL) 500 MG tablet Take 500 mg by mouth every 6 (six) hours as needed for mild pain or moderate pain.   05/23/2015 at Unknown time  . amLODipine (NORVASC) 10 MG tablet Take 1 tablet (10 mg total) by mouth daily. 30 tablet 6 05/24/2015 at Unknown time  . EPINEPHrine 0.3 mg/0.3 mL IJ SOAJ injection Inject 0.3 mLs (0.3 mg total) into the muscle once. 1 Device 0 unknown  . ibuprofen (ADVIL,MOTRIN) 200 MG tablet Take 200 mg by mouth 2 (two) times daily as needed for mild pain or moderate pain.   05/21/2015 at Unknown time  . chlorpheniramine-HYDROcodone (TUSSIONEX PENNKINETIC ER) 10-8 MG/5ML SUER Take 5 mLs by mouth every 12 (twelve) hours as needed for cough. (Patient not taking: Reported on 05/05/2015) 140 mL 0 Not Taking  . predniSONE (DELTASONE) 20 MG tablet Take 2 tablets (40 mg total) by  mouth daily with breakfast. (Patient not taking: Reported on 05/24/2015) 14 tablet 0    Scheduled: . amLODipine  10 mg Oral Daily  . ciprofloxacin  500 mg Oral BID  . feeding supplement (ENSURE ENLIVE)  237 mL Oral BID BM  . fluocinonide cream   Topical BID  . hydrOXYzine  25 mg Oral TID  . methylPREDNISolone (SOLU-MEDROL) injection  40 mg Intravenous Q6H  . sucralfate  1 g Oral TID WC & HS   Continuous:  EHO:ZYYQMGNOIBBCW, ondansetron (ZOFRAN) IV, oxyCODONE-acetaminophen, zolpidem  Assesment: She was admitted with acute kidney injury. 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 She has a UTI. She still has a lot of trouble with a rash. She has poor appetite. 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 Add cream and see if it makes a difference. She can eat food from home    LOS: 5 days   Megan Sims L 05/30/2015, 11:28 AM

## 2015-05-31 LAB — BASIC METABOLIC PANEL
Anion gap: 11 (ref 5–15)
BUN: 29 mg/dL — ABNORMAL HIGH (ref 6–20)
CO2: 24 mmol/L (ref 22–32)
Calcium: 8.5 mg/dL — ABNORMAL LOW (ref 8.9–10.3)
Chloride: 105 mmol/L (ref 101–111)
Creatinine, Ser: 0.87 mg/dL (ref 0.44–1.00)
GFR calc Af Amer: >60 mL/min (ref >60)
GFR calc non Af Amer: >60 mL/min (ref >60)
Glucose, Bld: 139 mg/dL — ABNORMAL HIGH (ref 65–99)
Potassium: 3.5 mmol/L (ref 3.5–5.1)
Sodium: 140 mmol/L (ref 135–145)

## 2015-05-31 LAB — CBC
HCT: 33.9 % — ABNORMAL LOW (ref 36.0–46.0)
Hemoglobin: 11.6 g/dL — ABNORMAL LOW (ref 12.0–15.0)
MCH: 30.9 pg (ref 26.0–34.0)
MCHC: 34.2 g/dL (ref 30.0–36.0)
MCV: 90.2 fL (ref 78.0–100.0)
Platelets: 294 10*3/uL (ref 150–400)
RBC: 3.76 MIL/uL — ABNORMAL LOW (ref 3.87–5.11)
RDW: 18.8 % — ABNORMAL HIGH (ref 11.5–15.5)
WBC: 9 10*3/uL (ref 4.0–10.5)

## 2015-05-31 NOTE — Progress Notes (Signed)
Pt. C/o  "fainting" "early this morning with night shift.I fell to my knees." No abrasions to legs noted. States "I feel weaker on my right side." No weakness or deficits assessed. Dr. Legrand Rams notified. Orthostatics done as ordered.

## 2015-05-31 NOTE — Progress Notes (Signed)
Subjective: Patient claims she pass out today trying to go the bath room. No chest pain, nausea or vomiting. Continue to have rash on her face. Objective: Vital signs in last 24 hours: Temp:  [98 F (36.7 C)-98.7 F (37.1 C)] 98.1 F (36.7 C) (05/08 0427) Pulse Rate:  [84-117] 84 (05/08 0427) Resp:  [20] 20 (05/08 0427) BP: (152-167)/(70-91) 152/70 mmHg (05/08 0427) SpO2:  [95 %-97 %] 95 % (05/08 0427) Weight change:  Last BM Date: 05/28/15  Intake/Output from previous day: 05/07 0701 - 05/08 0700 In: 360 [P.O.:360] Out: -   PHYSICAL EXAM General appearance: alert and no distress Resp: diminished breath sounds bilaterally and rhonchi bilaterally Cardio: S1, S2 normal GI: soft, non-tender; bowel sounds normal; no masses,  no organomegaly Extremities: extremities normal, atraumatic, no cyanosis or edema  Lab Results:  Results for orders placed or performed during the hospital encounter of 05/24/15 (from the past 48 hour(s))  CBC with Differential/Platelet     Status: Abnormal   Collection Time: 05/30/15  6:57 AM  Result Value Ref Range   WBC 8.8 4.0 - 10.5 K/uL   RBC 3.99 3.87 - 5.11 MIL/uL   Hemoglobin 12.2 12.0 - 15.0 g/dL   HCT 36.3 36.0 - 46.0 %   MCV 91.0 78.0 - 100.0 fL   MCH 30.6 26.0 - 34.0 pg   MCHC 33.6 30.0 - 36.0 g/dL   RDW 19.0 (H) 11.5 - 15.5 %   Platelets 294 150 - 400 K/uL   Neutrophils Relative % 75 %   Neutro Abs 6.6 1.7 - 7.7 K/uL   Lymphocytes Relative 15 %   Lymphs Abs 1.3 0.7 - 4.0 K/uL   Monocytes Relative 10 %   Monocytes Absolute 0.9 0.1 - 1.0 K/uL   Eosinophils Relative 0 %   Eosinophils Absolute 0.0 0.0 - 0.7 K/uL   Basophils Relative 0 %   Basophils Absolute 0.0 0.0 - 0.1 K/uL  Basic metabolic panel     Status: Abnormal   Collection Time: 05/30/15  6:57 AM  Result Value Ref Range   Sodium 140 135 - 145 mmol/L   Potassium 3.9 3.5 - 5.1 mmol/L   Chloride 106 101 - 111 mmol/L   CO2 25 22 - 32 mmol/L   Glucose, Bld 139 (H) 65 - 99 mg/dL    BUN 30 (H) 6 - 20 mg/dL   Creatinine, Ser 0.93 0.44 - 1.00 mg/dL   Calcium 9.0 8.9 - 10.3 mg/dL   GFR calc non Af Amer >60 >60 mL/min   GFR calc Af Amer >60 >60 mL/min    Comment: (NOTE) The eGFR has been calculated using the CKD EPI equation. This calculation has not been validated in all clinical situations. eGFR's persistently <60 mL/min signify possible Chronic Kidney Disease.    Anion gap 9 5 - 15    ABGS No results for input(s): PHART, PO2ART, TCO2, HCO3 in the last 72 hours.  Invalid input(s): PCO2 CULTURES Recent Results (from the past 240 hour(s))  Urine culture     Status: Abnormal   Collection Time: 05/24/15  7:30 PM  Result Value Ref Range Status   Specimen Description URINE, CLEAN CATCH  Final   Special Requests NONE  Final   Culture MULTIPLE SPECIES PRESENT, SUGGEST RECOLLECTION (A)  Final   Report Status 05/27/2015 FINAL  Final   Studies/Results: No results found.  Medications: I have reviewed the patient's current medications.  Assesment:   Principal Problem:   AKI (acute kidney injury) (  [place]) Active Problems:   Stevens-Johnson syndrome ([facility])   Cancer of upper lobe of right lung ([place])   Anemia due to antineoplastic chemotherapy   Rash, skin   Pill esophagitis due to tetracycline   UTI (lower urinary tract infection)   Plan:  Medications reviewed Continue IV steroid Will do cbc/BMP Will do orthostatic vitals check     LOS: 6 days   Megan Sims 05/31/2015, 8:16 AM

## 2015-05-31 NOTE — Evaluation (Signed)
Physical Therapy Evaluation Patient Details Name: Megan Sims MRN: [id number] DOB: [birth year] Today's Date: 05/31/2015   History of Present Illness  Pt is a 60yo F admitted to [facility] with c/o body aches and SOB. PMH includes AKI, lung CA, paget's disease, HTN. Pt reporting having a "rough morning" today when she woke up she tried to get up and just kept sliding into the floor. States she cant's remember what happened exactly, but she used the bathroom and has felt fine since then.   Clinical Impression  Pt found resting in her hospital bed upon arrival. She was A&Ox3 and willing to cooperate with therapist. She has been up and going to the restroom and walking around the hospital with assistance from her boyfriend and son. Overall, she demonstrates some limitations in LE strength as well as increased time to complete bed mobility and transfers. She ambulates without AD however therapist notes she will occasionally reach for therapist hand or walls for support. Pt ambulated 150' with supervision and requesting to take 2 short rest breaks for her legs. Pt reporting that at home this is normal for her as she usually takes breaks and uses the walls for support when walking from one point to another. Pt is currently at her baseline and does not require acute skilled PT services for safe return home, however she could benefit from outpatient PT services to improve her overall strength, balance and endurance for decreased risk of falls. Discussed this with pt who is in agreement and will be following up with her PCP.     Follow Up Recommendations Outpatient PT (to address ongoing issues of LE weakness, decreased endurance and unsteadiness)    Equipment Recommendations  None recommended by PT    Recommendations for Other Services       Precautions / Restrictions Precautions Precautions: None Restrictions Weight Bearing Restrictions: No      Mobility  Bed Mobility Overal bed mobility: Modified  Independent             General bed mobility comments: use of handrails  Transfers Overall transfer level: Modified independent                  Ambulation/Gait Ambulation/Gait assistance: Modified independent (Device/Increase time) Ambulation Distance (Feet): 150 Feet Assistive device: 1 person hand held assist Gait Pattern/deviations: Decreased step length - right;Decreased step length - left     General Gait Details: Pt taking 2 rest breaks throughout activity  Stairs            Wheelchair Mobility    Modified Rankin (Stroke Patients Only)       Balance Overall balance assessment: Needs assistance Sitting-balance support: No upper extremity supported;Feet supported Sitting balance-Leahy Scale: Good     Standing balance support: No upper extremity supported Standing balance-Leahy Scale: Good                 High Level Balance Comments: occasional reach for the wall or HHA during ambulation due to reported LE fatigue and unsteadiness             Pertinent Vitals/Pain Pain Assessment: No/denies pain    Home Living Family/patient expects to be discharged to:: Private residence Living Arrangements: Spouse/significant other;Children Available Help at Discharge: Family;Other (Comment) (Home aide comes 5x/week for several hours ) Type of Home: House Home Access: Level entry     Home Layout: One level Home Equipment: None      Prior Function Level of Independence: Independent  Comments: Pt reporting she was able to drive, go grocery shopping, and perform all ADLs at home prior to hospitalization     Hand Dominance        Extremity/Trunk Assessment               Lower Extremity Assessment: Generalized weakness (grossly 4/5 MMT, pt reporting this is baseline for her)         Communication   Communication: No difficulties  Cognition Arousal/Alertness: Awake/alert Behavior During Therapy: WFL for tasks  assessed/performed Overall Cognitive Status: Within Functional Limits for tasks assessed                      General Comments      Exercises General Exercises - Lower Extremity Ankle Circles/Pumps: Strengthening;Both;5 reps;Seated Long Arc Quad: Seated;Strengthening;Both;5 reps Hip ABduction/ADduction: Strengthening;Seated;5 reps;Both Hip Flexion/Marching: Seated;Strengthening;Both;5 reps      Assessment/Plan    PT Assessment Patent does not need any further PT services  PT Diagnosis Generalized weakness;Difficulty walking   PT Problem List    PT Treatment Interventions     PT Goals (Current goals can be found in the Care Plan section)      Frequency     Barriers to discharge        Co-evaluation               End of Session Equipment Utilized During Treatment: Gait belt Activity Tolerance: Patient tolerated treatment well Patient left: with call bell/phone within reach;in chair Nurse Communication: Mobility status (Notified PT Beth of pt status to relay info to nurse (currently in a meeting))         Time: 0938-1010 PT Time Calculation (min) (ACUTE ONLY): 32 min   Charges:   PT Evaluation $PT Eval Low Complexity: 1 Procedure PT Treatments $Therapeutic Activity: 23-37 mins   PT G Codes:        10:33 AM,2015-06-08 Elly Modena PT, DPT [facility] Outpatient Physical Therapy [phone]

## 2015-06-01 MED ORDER — ZOLPIDEM TARTRATE 10 MG PO TABS: 10.0000 mg | ORAL_TABLET | Freq: Every evening | ORAL | Status: DC | PRN

## 2015-06-01 MED ORDER — HYDROXYZINE HCL 25 MG PO TABS: 25.0000 mg | ORAL_TABLET | Freq: Three times a day (TID) | ORAL | Status: DC

## 2015-06-01 MED ORDER — PREDNISONE 10 MG (21) PO TBPK: 10.0000 mg | ORAL_TABLET | Freq: Every day | ORAL | Status: DC

## 2015-06-01 MED ORDER — HEPARIN SOD (PORK) LOCK FLUSH 100 UNIT/ML IV SOLN
500.0000 [IU] | INTRAVENOUS | Status: DC | PRN
  Filled 2015-06-01: qty 5

## 2015-06-01 NOTE — Discharge Summary (Signed)
Physician Discharge Summary  Patient ID: Megan Sims MRN: [id number] DOB/AGE: August 16, 1955 60 y.o. 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[place])   Cancer of upper lobe of right lung ([place])   Anemia due to antineoplastic chemotherapy   Rash, skin   Pill esophagitis due to tetracycline   UTI (lower urinary tract infection)     Medication List    STOP taking these medications        chlorpheniramine-HYDROcodone 10-8 MG/5ML Suer  Commonly known as:  TUSSIONEX PENNKINETIC ER     predniSONE 20 MG tablet  Commonly known as:  DELTASONE  Replaced by:  predniSONE 10 MG (21) Tbpk tablet      TAKE these medications        acetaminophen 500 MG tablet  Commonly known as:  TYLENOL  Take 500 mg by mouth every 6 (six) hours as needed for mild pain or moderate pain.     amLODipine 10 MG tablet  Commonly known as:  NORVASC  Take 1 tablet (10 mg total) by mouth daily.     EPINEPHrine 0.3 mg/0.3 mL Soaj injection  Commonly known as:  EPI-PEN  Inject 0.3 mLs (0.3 mg total) into the muscle once.     hydrOXYzine 25 MG tablet  Commonly known as:  ATARAX/VISTARIL  Take 1 tablet (25 mg total) by mouth 3 (three) times daily.     ibuprofen 200 MG tablet  Commonly known as:  ADVIL,MOTRIN  Take 200 mg by mouth 2 (two) times daily as needed for mild pain or moderate pain.     predniSONE 10 MG (21) Tbpk tablet  Commonly known as:  STERAPRED UNI-PAK 21 TAB  Take 1 tablet (10 mg total) by mouth daily. 3 tab po daily for 3 days, 2 tab po daily for 3 days, 21 tab po daily for 3 days then stop     zolpidem 10 MG tablet  Commonly known as:  AMBIEN  Take 1 tablet (10 mg total) by mouth at bedtime as needed for sleep.        Discharged Condition:  improved    Consults: Dermatology ( to be done in out  patient)  Significant Diagnostic Studies: Dg Chest 2 View  05/24/2015  CLINICAL DATA:  Shortness of breath for 2 weeks. History of lung carcinoma EXAM: CHEST  2 VIEW COMPARISON:  Chest radiograph and chest CT May 03, 2015 FINDINGS: There is scarring right upper lobe, stable. There is no frank edema or consolidation. Heart size and pulmonary vascularity are normal. No adenopathy. Port-A-Cath tip is in the superior vena cava. No pneumothorax. No bone lesions are evident. There is degenerative change in the thoracic spine. 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 IMPRESSION: Scarring right upper lobe. No edema or consolidation. 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 Electronically Signed   By: Lowella Grip III M.D.   On: 05/24/2015 19:38   Dg Chest 2 View  05/03/2015  CLINICAL DATA:  Cough for 1 week. Recent pneumonia. History of stage III lung carcinoma EXAM: CHEST  2 VIEW COMPARISON:  April 14, 2015 chest radiograph and chest CT April 30, 2015 FINDINGS: There has been partial but incomplete clearing of infiltrate from the anterior segment right upper lobe. Lungs elsewhere clear. Heart size and pulmonary vascularity are normal. No adenopathy. Port-A-Cath  tip is in the superior vena cava. No pneumothorax. There is degenerative change in the thoracic spine. There is atherosclerotic calcification in the aorta. IMPRESSION: Partial but incomplete clearing of anterior segment right upper lobe infiltrate. No new opacity. Stable cardiac silhouette. No adenopathy evident. Electronically Signed   By: Lowella Grip III M.D.   On: 05/03/2015 18:54   Ct Angio Chest Pe W/cm &/or Wo Cm  05/03/2015  CLINICAL DATA:  Cough, fatigue and shortness of breath for 1 week. History of lung cancer. EXAM: CT ANGIOGRAPHY CHEST WITH CONTRAST TECHNIQUE: Multidetector CT imaging of the chest was performed using the standard protocol during bolus administration of intravenous  contrast. Multiplanar CT image reconstructions and MIPs were obtained to evaluate the vascular anatomy. CONTRAST:  100 cc Omnipaque 300 COMPARISON:  CT scans 01/28/2015 and 04/30/2015 FINDINGS: Mediastinum/Nodes: The permanent left-sided Port-A-Cath is stable. No breast masses, supraclavicular or axillary lymphadenopathy. Scattered axillary lymph nodes are stable. The heart is normal in size. No pericardial effusion. Stable tortuosity and calcification of the thoracic aorta but no dissection or focal aneurysm. The branch vessels are patent. Moderate atherosclerotic calcifications. Coronary artery calcifications are noted. The pulmonary arterial tree is well opacified. No filling defects to suggest pulmonary embolism. No mediastinal or hilar mass or lymphadenopathy. Stable small scattered lymph nodes. Stable matted soft tissue density in the right paratracheal area with maximal diameter of 10 mm. The esophagus is grossly normal. Lungs/Pleura: Stable emphysematous changes. Stable probable radiation changes involving the right upper lobe. No new or progressive findings. No evidence of pulmonary metastatic disease. Upper abdomen: No significant findings. Stable mild thickening of the left adrenal gland. Musculoskeletal: No significant bony findings. 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 Review of the MIP images confirms the above findings. IMPRESSION: 1. No CT findings for pulmonary embolism. 2. Stable atherosclerotic changes involving the thoracic aorta but no focal aneurysm or dissection. 3. No CT findings worrisome for recurrent tumor or metastatic disease. Stable radiation changes involving the right upper lobe and stable emphysematous changes. 4. No acute pulmonary findings. 5. Stable thickened left adrenal gland. 6. 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 Electronically Signed   By: Marijo Sanes M.D.   On: 05/03/2015 21:21    Lab Results: Basic  Metabolic Panel:  Recent Labs  05/30/15 0657 05/31/15 0911  NA 140 140  K 3.9 3.5  CL 106 105  CO2 25 24  GLUCOSE 139* 139*  BUN 30* 29*  CREATININE 0.93 0.87  CALCIUM 9.0 8.5*   Liver Function Tests: No results for input(s): AST, ALT, ALKPHOS, BILITOT, PROT, ALBUMIN in the last 72 hours.   CBC:  Recent Labs  05/30/15 0657 05/31/15 0911  WBC 8.8 9.0  NEUTROABS 6.6  --   HGB 12.2 11.6*  HCT 36.3 33.9*  MCV 91.0 90.2  PLT 294 294    Recent Results (from the past 240 hour(s))  Urine culture     Status: Abnormal   Collection Time: 05/24/15  7:30 PM  Result Value Ref Range Status   Specimen Description URINE, CLEAN CATCH  Final   Special Requests NONE  Final   Culture MULTIPLE SPECIES PRESENT, SUGGEST RECOLLECTION (A)  Final   Report Status 05/27/2015 FINAL  Final     Hospital Course:    This is a 60 years old female with history of multiple medical illnesses was admitted due to generalized body ache, rashes and shortness of breath. Patient was found to have  acute renal failure, probably UTI and Stephensons-Johnson type of reaction probably secondary to doxycycline. Patient was rehydrated, started on antibiotics and IV steroid. Her urine culture grew multiple species. Over all she improved and discharged on oral steroid. Will do dermatology consult on out patient.  Discharge Exam: Blood pressure 151/81, pulse 93, temperature 98.2 F (36.8 C), temperature source Oral, resp. rate 20, height '5\' 1"'$  (1.549 m), weight 55.112 kg (121 lb 8 oz), SpO2 97 %.   Disposition:  home        Follow-up Information    Follow up with [facility], MD In 2 weeks.   Specialty:  Internal Medicine   Contact information:   [place] [address] [phone]       Signed: Rosita Fire   06/01/2015, 8:26 AM

## 2015-06-01 NOTE — Care Management Note (Signed)
Case Management Note  Patient Details  Name: Megan Sims MRN: [id number] Date of Birth: 1955-06-21  Expected Discharge Date:    06/01/2015              Expected Discharge Plan:  Home/Self Care  In-House Referral:  NA  Discharge planning Services  CM Consult  Post Acute Care Choice:  NA Choice offered to:  NA  DME Arranged:    DME Agency:     HH Arranged:    [place] Agency:     Status of Service:  Completed, signed off  Medicare Important Message Given:    Date Medicare IM Given:    Medicare IM give by:    Date Additional Medicare IM Given:    Additional Medicare Important Message give by:     If discussed at [place] of Stay Meetings, dates discussed:    Additional Comments: Pt discharging home today with self care. PT has recommended OP therapy. Pt has chosen [facility] in [place]. Order faxed to [facility].  Sherald Barge, RN 06/01/2015, 12:40 PM

## 2015-06-07 ENCOUNTER — Ambulatory Visit (HOSPITAL_COMMUNITY): Payer: Medicaid Other | Attending: Internal Medicine | Admitting: Physical Therapy

## 2015-06-14 ENCOUNTER — Emergency Department (HOSPITAL_COMMUNITY): Payer: Medicaid Other

## 2015-06-14 ENCOUNTER — Encounter (HOSPITAL_COMMUNITY): Payer: Self-pay | Admitting: Emergency Medicine

## 2015-06-14 ENCOUNTER — Observation Stay (HOSPITAL_COMMUNITY)
Admission: EM | Admit: 2015-06-14 | Discharge: 2015-06-17 | Disposition: A | Discharge status: Home / Self Care | Payer: Medicaid Other | Attending: Internal Medicine | Admitting: Internal Medicine

## 2015-06-14 DIAGNOSIS — I10 Essential (primary) hypertension: Secondary | ICD-10-CM | POA: Insufficient documentation

## 2015-06-14 DIAGNOSIS — J439 Emphysema, unspecified: Secondary | ICD-10-CM | POA: Insufficient documentation

## 2015-06-14 DIAGNOSIS — D6489 Other specified anemias: Secondary | ICD-10-CM | POA: Insufficient documentation

## 2015-06-14 DIAGNOSIS — Z85118 Personal history of other malignant neoplasm of bronchus and lung: Secondary | ICD-10-CM | POA: Insufficient documentation

## 2015-06-14 DIAGNOSIS — K2950 Unspecified chronic gastritis without bleeding: Secondary | ICD-10-CM | POA: Diagnosis not present

## 2015-06-14 DIAGNOSIS — Z79899 Other long term (current) drug therapy: Secondary | ICD-10-CM | POA: Insufficient documentation

## 2015-06-14 DIAGNOSIS — F17210 Nicotine dependence, cigarettes, uncomplicated: Secondary | ICD-10-CM | POA: Diagnosis not present

## 2015-06-14 DIAGNOSIS — J189 Pneumonia, unspecified organism: Secondary | ICD-10-CM

## 2015-06-14 DIAGNOSIS — B3781 Candidal esophagitis: Secondary | ICD-10-CM | POA: Diagnosis not present

## 2015-06-14 DIAGNOSIS — C3491 Malignant neoplasm of unspecified part of right bronchus or lung: Secondary | ICD-10-CM | POA: Diagnosis present

## 2015-06-14 DIAGNOSIS — K3189 Other diseases of stomach and duodenum: Secondary | ICD-10-CM | POA: Insufficient documentation

## 2015-06-14 DIAGNOSIS — R1310 Dysphagia, unspecified: Secondary | ICD-10-CM | POA: Diagnosis present

## 2015-06-14 DIAGNOSIS — K2980 Duodenitis without bleeding: Secondary | ICD-10-CM | POA: Diagnosis not present

## 2015-06-14 DIAGNOSIS — C3492 Malignant neoplasm of unspecified part of left bronchus or lung: Secondary | ICD-10-CM

## 2015-06-14 DIAGNOSIS — Z9221 Personal history of antineoplastic chemotherapy: Secondary | ICD-10-CM | POA: Diagnosis not present

## 2015-06-14 DIAGNOSIS — Z9223 Personal history of estrogen therapy: Secondary | ICD-10-CM | POA: Insufficient documentation

## 2015-06-14 DIAGNOSIS — J029 Acute pharyngitis, unspecified: Secondary | ICD-10-CM | POA: Diagnosis not present

## 2015-06-14 DIAGNOSIS — B370 Candidal stomatitis: Secondary | ICD-10-CM | POA: Diagnosis present

## 2015-06-14 LAB — BASIC METABOLIC PANEL
ANION GAP: 8 (ref 5–15)
BUN: 21 mg/dL — AB (ref 6–20)
CHLORIDE: 106 mmol/L (ref 101–111)
CO2: 26 mmol/L (ref 22–32)
Calcium: 9.4 mg/dL (ref 8.9–10.3)
Creatinine, Ser: 0.81 mg/dL (ref 0.44–1.00)
GFR calc Af Amer: >60 mL/min (ref >60)
GFR calc non Af Amer: >60 mL/min (ref >60)
GLUCOSE: 96 mg/dL (ref 65–99)
POTASSIUM: 3.7 mmol/L (ref 3.5–5.1)
SODIUM: 140 mmol/L (ref 135–145)

## 2015-06-14 LAB — I-STAT CG4 LACTIC ACID, ED
LACTIC ACID, VENOUS: 0.94 mmol/L (ref 0.5–2.0)
Lactic Acid, Venous: 1.74 mmol/L (ref 0.5–2.0)

## 2015-06-14 LAB — HEPATIC FUNCTION PANEL
ALK PHOS: 97 U/L (ref 38–126)
ALT: 15 U/L (ref 14–54)
AST: 38 U/L (ref 15–41)
Albumin: 3.3 g/dL — ABNORMAL LOW (ref 3.5–5.0)
BILIRUBIN INDIRECT: 0.5 mg/dL (ref 0.3–0.9)
Bilirubin, Direct: 0.1 mg/dL (ref 0.1–0.5)
TOTAL PROTEIN: 6.4 g/dL — AB (ref 6.5–8.1)
Total Bilirubin: 0.6 mg/dL (ref 0.3–1.2)

## 2015-06-14 LAB — LIPASE, BLOOD: Lipase: 14 U/L (ref 11–51)

## 2015-06-14 LAB — CBC WITH DIFFERENTIAL/PLATELET
Basophils Absolute: 0 10*3/uL (ref 0.0–0.1)
Basophils Relative: 0 %
Eosinophils Absolute: 0.1 10*3/uL (ref 0.0–0.7)
Eosinophils Relative: 1 %
HCT: 35.5 % — ABNORMAL LOW (ref 36.0–46.0)
HEMOGLOBIN: 11.6 g/dL — AB (ref 12.0–15.0)
LYMPHS ABS: 1.1 10*3/uL (ref 0.7–4.0)
LYMPHS PCT: 12 %
MCH: 30.5 pg (ref 26.0–34.0)
MCHC: 32.7 g/dL (ref 30.0–36.0)
MCV: 93.4 fL (ref 78.0–100.0)
Monocytes Absolute: 0.7 10*3/uL (ref 0.1–1.0)
Monocytes Relative: 7 %
NEUTROS PCT: 80 %
Neutro Abs: 7.5 10*3/uL (ref 1.7–7.7)
Platelets: 214 10*3/uL (ref 150–400)
RBC: 3.8 MIL/uL — AB (ref 3.87–5.11)
RDW: 19.8 % — ABNORMAL HIGH (ref 11.5–15.5)
WBC: 9.4 10*3/uL (ref 4.0–10.5)

## 2015-06-14 MED ORDER — SODIUM CHLORIDE 0.9 % IV SOLN: INTRAVENOUS | Status: DC

## 2015-06-14 MED ORDER — VANCOMYCIN HCL IN DEXTROSE 1-5 GM/200ML-% IV SOLN
1000.0000 mg | Freq: Once | INTRAVENOUS | Status: AC
  Administered 2015-06-14: 1000 mg via INTRAVENOUS
  Filled 2015-06-14: qty 200

## 2015-06-14 MED ORDER — IOPAMIDOL (ISOVUE-300) INJECTION 61%
75.0000 mL | Freq: Once | INTRAVENOUS | Status: AC | PRN
  Administered 2015-06-14: 75 mL via INTRAVENOUS

## 2015-06-14 MED ORDER — DEXTROSE 5 % IV SOLN
1.0000 g | Freq: Once | INTRAVENOUS | Status: AC
  Administered 2015-06-14: 1 g via INTRAVENOUS
  Filled 2015-06-14: qty 1

## 2015-06-14 MED ORDER — SODIUM CHLORIDE 0.9 % IV BOLUS (SEPSIS)
500.0000 mL | Freq: Once | INTRAVENOUS | Status: AC
Start: 2015-06-14 — End: 2015-06-14
  Administered 2015-06-14: 500 mL via INTRAVENOUS

## 2015-06-14 MED ORDER — SODIUM CHLORIDE 0.9 % IV SOLN
INTRAVENOUS | Status: DC
  Administered 2015-06-14: 20:00:00 via INTRAVENOUS

## 2015-06-14 NOTE — ED Provider Notes (Signed)
CSN: [id number]     Arrival date & time 06/14/15  1242 History   First MD Initiated Contact with Patient 06/14/15 1707     Chief Complaint  Patient presents with  . Dysphagia     (Consider location/radiation/quality/duration/timing/severity/associated sxs/prior Treatment) The history is provided by the patient.  Patient with complaint of difficulty swallowing for a week. Not able to keep any food down it comes right back up can occasionally get some liquids down. Occasionally has trouble with her saliva going down. Also a sore throat with mild for a week and a half. Patient without any speech difficulties and also denies any of neuro focal deficits. Patient has a history of lung cancer that based on hematuria notes is in remission. Patient not undergoing any treatment course now but has had radiation and chemotherapy in the past. Patient was admitted to the hospital on May 1 and just recently got out that admission was for dehydration.  Patient denies any fevers. Patient has a rash that is been existing since her recent admission.  Past Medical History  Diagnosis Date  . Migraine   . Lung abnormality   . Hypertension   . Pleurisy   . Cerebral aneurysm 2 brain surgeries 96 or 97  . Paget disease of bone   . Diarrhea   . Renal insufficiency     Patient states " no kidney problems  . Malignant neoplasm of unknown origin ([place])   . S/P biopsy     of throat per patient.  . Oral thrush 09/24/2014  . Nicotine dependence 09/24/2014  . Chronic neck pain 09/24/2014  . Cancer [facility])     malignant neoplasm of unknown origin  . Skin ulcer ([place]) 11/18/2014  . Emphysema of lung ([place]) 05/05/2015   Past Surgical History  Procedure Laterality Date  . Chest tube insertion    . Tubal ligation    . Cerebral aneurysm repair  96 or 97  . Colonoscopy     Family History  Problem Relation Age of Onset  . Hypertension    . Diabetes    . Kidney disease    . Cancer Mother     throat ca  . Cancer  Maternal Grandmother     thyroid ca   Social History  Substance Use Topics  . Smoking status: Current Some Day Smoker -- 0.50 packs/day for 34 years    Types: Cigarettes  . Smokeless tobacco: Never Used  . Alcohol Use: No   OB History    Gravida Para Term Preterm AB TAB SAB Ectopic Multiple Living            3     Review of Systems  Constitutional: Negative for fever.  HENT: Positive for sore throat and trouble swallowing. Negative for congestion.   Eyes: Negative for redness.  Respiratory: Positive for choking. Negative for cough and shortness of breath.   Gastrointestinal: Negative for nausea, vomiting and abdominal pain.  Genitourinary: Negative for dysuria.  Musculoskeletal: Negative for back pain.  Skin: Positive for rash.  Neurological: Negative for headaches.  Hematological: Does not bruise/bleed easily.  Psychiatric/Behavioral: Negative for confusion.      Allergies  Review of patient's allergies indicates no known allergies.  Home Medications   Prior to Admission medications   Medication Sig Start Date End Date Taking? Authorizing Provider  amLODipine (NORVASC) 10 MG tablet Take 1 tablet (10 mg total) by mouth daily. 11/11/14  Yes Heath Lark, MD  chlorpheniramine-HYDROcodone (TUSSIONEX) 10-8 MG/5ML SUER Take  5 mLs by mouth every 12 (twelve) hours as needed for cough.  05/05/15  Yes Historical Provider, MD  EPINEPHrine 0.3 mg/0.3 mL IJ SOAJ injection Inject 0.3 mLs (0.3 mg total) into the muscle once. 09/12/14  Yes Cherene Altes, MD  hydrOXYzine (ATARAX/VISTARIL) 25 MG tablet Take 1 tablet (25 mg total) by mouth 3 (three) times daily. Patient taking differently: Take 25 mg by mouth every 8 (eight) hours as needed for itching.  06/01/15  Yes Rosita Fire, MD  zolpidem (AMBIEN) 10 MG tablet Take 1 tablet (10 mg total) by mouth at bedtime as needed for sleep. 06/01/15  Yes Rosita Fire, MD  predniSONE (STERAPRED UNI-PAK 21 TAB) 10 MG (21) TBPK tablet Take 1 tablet (10  mg total) by mouth daily. 3 tab po daily for 3 days, 2 tab po daily for 3 days, 21 tab po daily for 3 days then stop Patient not taking: Reported on 06/14/2015 06/01/15   Rosita Fire, MD   BP 159/92 mmHg  Pulse 97  Temp(Src) 98.3 F (36.8 C) (Oral)  Resp 16  Ht '5\' 1"'$  (1.549 m)  Wt 54.885 kg  BMI 22.87 kg/m2  SpO2 98% Physical Exam  Constitutional: She is oriented to person, place, and time. She appears well-developed and well-nourished. No distress.  HENT:  Head: Normocephalic and atraumatic.  Mouth/Throat: Oropharynx is clear and moist. No oropharyngeal exudate.  Uvula is midline no tonsillar exudate no significant swelling may be a little erythema.  Eyes: Conjunctivae and EOM are normal. Pupils are equal, round, and reactive to light.  Neck: Normal range of motion. Neck supple.  Cardiovascular: Normal rate, regular rhythm and normal heart sounds.   No murmur heard. Pulmonary/Chest: Effort normal and breath sounds normal. No respiratory distress.  Abdominal: Soft. Bowel sounds are normal. There is no tenderness.  Musculoskeletal: Normal range of motion.  Neurological: She is alert and oriented to person, place, and time. No cranial nerve deficit. She exhibits normal muscle tone. Coordination normal.  Skin: Skin is warm. Rash noted.  Nursing note and vitals reviewed.   ED Course  Procedures (including critical care time) Labs Review Labs Reviewed  CBC WITH DIFFERENTIAL/PLATELET - Abnormal; Notable for the following:    RBC 3.80 (*)    Hemoglobin 11.6 (*)    HCT 35.5 (*)    RDW 19.8 (*)    All other components within normal limits  BASIC METABOLIC PANEL - Abnormal; Notable for the following:    BUN 21 (*)    All other components within normal limits  HEPATIC FUNCTION PANEL - Abnormal; Notable for the following:    Total Protein 6.4 (*)    Albumin 3.3 (*)    All other components within normal limits  LIPASE, BLOOD  I-STAT CG4 LACTIC ACID, ED  I-STAT CG4 LACTIC ACID, ED    Results for orders placed or performed during the hospital encounter of 06/14/15  CBC with Differential  Result Value Ref Range   WBC 9.4 4.0 - 10.5 K/uL   RBC 3.80 (L) 3.87 - 5.11 MIL/uL   Hemoglobin 11.6 (L) 12.0 - 15.0 g/dL   HCT 35.5 (L) 36.0 - 46.0 %   MCV 93.4 78.0 - 100.0 fL   MCH 30.5 26.0 - 34.0 pg   MCHC 32.7 30.0 - 36.0 g/dL   RDW 19.8 (H) 11.5 - 15.5 %   Platelets 214 150 - 400 K/uL   Neutrophils Relative % 80 %   Neutro Abs 7.5 1.7 - 7.7 K/uL  Lymphocytes Relative 12 %   Lymphs Abs 1.1 0.7 - 4.0 K/uL   Monocytes Relative 7 %   Monocytes Absolute 0.7 0.1 - 1.0 K/uL   Eosinophils Relative 1 %   Eosinophils Absolute 0.1 0.0 - 0.7 K/uL   Basophils Relative 0 %   Basophils Absolute 0.0 0.0 - 0.1 K/uL  Basic metabolic panel  Result Value Ref Range   Sodium 140 135 - 145 mmol/L   Potassium 3.7 3.5 - 5.1 mmol/L   Chloride 106 101 - 111 mmol/L   CO2 26 22 - 32 mmol/L   Glucose, Bld 96 65 - 99 mg/dL   BUN 21 (H) 6 - 20 mg/dL   Creatinine, Ser 0.81 0.44 - 1.00 mg/dL   Calcium 9.4 8.9 - 10.3 mg/dL   GFR calc non Af Amer >60 >60 mL/min   GFR calc Af Amer >60 >60 mL/min   Anion gap 8 5 - 15  Hepatic function panel  Result Value Ref Range   Total Protein 6.4 (L) 6.5 - 8.1 g/dL   Albumin 3.3 (L) 3.5 - 5.0 g/dL   AST 38 15 - 41 U/L   ALT 15 14 - 54 U/L   Alkaline Phosphatase 97 38 - 126 U/L   Total Bilirubin 0.6 0.3 - 1.2 mg/dL   Bilirubin, Direct 0.1 0.1 - 0.5 mg/dL   Indirect Bilirubin 0.5 0.3 - 0.9 mg/dL  Lipase, blood  Result Value Ref Range   Lipase 14 11 - 51 U/L  I-Stat CG4 Lactic Acid, ED  Result Value Ref Range   Lactic Acid, Venous 1.74 0.5 - 2.0 mmol/L  I-Stat CG4 Lactic Acid, ED  Result Value Ref Range   Lactic Acid, Venous 0.94 0.5 - 2.0 mmol/L     Imaging Review Dg Chest 2 View  06/14/2015  CLINICAL DATA:  Acute onset of difficulty swallowing and vomiting. Sore throat. Initial encounter. EXAM: CHEST  2 VIEW COMPARISON:  Chest radiograph  performed 05/24/2015 FINDINGS: The lungs are well-aerated. Patchy right midlung airspace opacities raise concern for pneumonia. This is new from the recent prior study. There is no evidence of pleural effusion or pneumothorax. The heart is normal in size; the mediastinal contour is within normal limits. A left-sided chest port is noted ending about the mid SVC. No acute osseous abnormalities are seen. IMPRESSION: Patchy right-sided airspace opacities, new from the recent prior study, raise concern for pneumonia. Electronically Signed   By: Garald Balding M.D.   On: 06/14/2015 18:32   Ct Head Wo Contrast  06/14/2015  CLINICAL DATA:  Initial evaluation for acute difficulty swelling. EXAM: CT HEAD WITHOUT CONTRAST TECHNIQUE: Contiguous axial images were obtained from the base of the skull through the vertex without intravenous contrast. COMPARISON:  Prior studies from 06/14/2015. FINDINGS: Postoperative changes from prior bilateral craniotomies again seen. 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 Encephalomalacia of within the anterior right temporal pole. Changes are stable. Atrophy with chronic microvascular ischemic disease. Remote lacunar infarct within the left caudate head. Additional remote lacunar infarct within the right basal ganglia. No acute intracranial hemorrhage. No definite acute large vessel territory infarct, although evaluation somewhat limited by streak artifact from aneurysm clips. No mass lesion, midline shift, or mass effect. No hydrocephalus. No extra-axial fluid collection. Scalp soft tissues within normal limits. No acute abnormality about the orbits. Paranasal sinuses are clear. Remote defect at the right lamina papyracea noted. No mastoid effusion. No acute abnormality about the calvarium.  Post craniotomy changes. IMPRESSION: 1.  No acute intracranial process. 2. Stable postoperative changes, generalized cerebral atrophy, and chronic small vessel ischemic disease. 3. 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 Electronically Signed   By: Jeannine Boga M.D.   On: 06/14/2015 21:12   Ct Soft Tissue Neck W Contrast  06/14/2015  CLINICAL DATA:  60 year old female with history of lung cancer. Difficulty swallowing and sore throat for the past week and a half. Subsequent encounter. EXAM: CT NECK WITH CONTRAST TECHNIQUE: Multidetector CT imaging of the neck was performed using the standard protocol following the bolus administration of intravenous contrast. CONTRAST:  34m ISOVUE-300 IOPAMIDOL (ISOVUE-300) INJECTION 61% COMPARISON:  06/14/2015 chest x-ray. CT of the chest performed same date is dictated separately. FINDINGS: Pharynx and larynx: Symmetric mild prominence of palatine tonsils may reflect result of mild inflammation. No deep drainable abscess noted. Salivary glands: No mass or inflammation noted. Thyroid: No mass identified. Lymph nodes: Scattered normal to top-normal size lymph nodes. Vascular: Bilateral carotid bifurcation calcifications with hemodynamically significant stenosis suspected but incompletely assessed. Limited intracranial: Negative. Visualized orbits: Not imaged. Mastoids and visualized paranasal sinuses: Portions visualized clear. Skeleton: No osseous destructive lesion. Mild cervical spondylotic changes. Upper chest: No apical mass. Please see chest CT report dictated separately. IMPRESSION: Symmetric mild prominence of palatine tonsils may reflect result of mild inflammation. Bilateral carotid bifurcation calcifications with hemodynamically significant stenosis suspected but incompletely assessed. Chest CT report dictated separately. Electronically Signed   By: SGenia DelM.D.   On: 06/14/2015 20:07   Ct Chest Wo Contrast  06/14/2015  CLINICAL DATA:  Abnormal chest radiograph. Assess pneumonia. Personal history of lung cancer. Initial encounter. EXAM: CT CHEST WITHOUT CONTRAST TECHNIQUE: Multidetector CT imaging of the  chest was performed following the standard protocol without IV contrast. COMPARISON:  CTA of the chest performed 05/03/2015, and chest radiograph performed earlier today at 6:23 p.m. FINDINGS: Mild focal opacities along the right minor fissure and at the right upper lobe are similar in appearance to prior CTA, though less hazy, but are significantly more prominent on chest radiograph. This may be projectional on chest radiograph, or may reflect interval redistribution of atypical infection superimposed on chronic scarring. The left lung appears relatively clear. No pleural effusion or pneumothorax is seen. No dominant mass is seen to suggest recurrent malignancy. Diffuse coronary artery calcifications are seen. Trace pericardial fluid remains within normal limits. No mediastinal lymphadenopathy is seen. No pericardial effusion is identified. Scattered calcification is noted along the aortic arch and proximal great vessels. A left-sided chest port is noted ending about the mid SVC. The visualized portions of the thyroid gland are unremarkable. No axillary lymphadenopathy is seen. The visualized portions of the liver are unremarkable. The spleen is diminutive. The visualized portions of the gallbladder, pancreas, adrenal glands and kidneys are within normal limits. No acute osseous abnormalities are identified. 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 IMPRESSION: 1. Small focal opacities along the right minor fissure and at the right upper lobe are similar in appearance to the prior CTA, though less hazy. However, these are significantly more prominent on the corresponding chest radiograph. This may be projectional on chest radiograph, or may reflect interval redistribution of atypical infection, superimposed on chronic scarring. 2. No definite evidence for recurrence of malignancy. 3. Diffuse coronary artery calcifications seen. 4. 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 Electronically  Signed   By: JGarald BaldingM.D.   On: 06/14/2015 20:01   I  have personally reviewed and evaluated these images and lab results as part of my medical decision-making.   EKG Interpretation None      MDM   Final diagnoses:  HCAP (healthcare-associated pneumonia)  Dysphagia    Patient initially treated as as a hospital-acquired pneumonia based on the original chest x-ray. But CT chest made that not is clear that there is pneumonia or that there is aspiration. However patient still with significant dysphasia cannot swallow solid foods very well can get a little bit of liquids to go down and sometimes even had trouble with the saliva going down. Head CT shows no evidence of an acute stroke. However MRI was not done. Patient is followed by Dr. Legrand Rams. In discussion with the hospitalist we decided patient needs to be admitted for this dysphasia and have it evaluated. Needs an upper endoscopy this patient not able to eat food very well. Although the chest x-ray raises some concerns for pneumonia patient was started on antibiotics for that lactic acids are negative she does not have a leukocytosis same patient clinically not acting as if there is pneumonia. From review of her charts it appears that heme aqua's aware of some of the swallowing difficulties and also in August she had complaint of dysphagia at that time. Patient recently was admitted to hospitals of it is a pneumonia is a hospital-acquired  pneumonia .   In addition CT neck soft tissue with contrast had no significant findings. Patient not spitting out saliva while here in the emergency department. Not concerned about an acute obstruction.   Fredia Sorrow, MD 06/15/15 [id number]

## 2015-06-14 NOTE — ED Notes (Addendum)
Patient complaining of difficulty swallowing x 1 week. States "I can't swallow anything and if I do it comes right back up." States she had sore throat 1 1/2 weeks ago but denies sore throat at this time. Patient speech clear and no oral swelling noted at triage.

## 2015-06-15 ENCOUNTER — Encounter (HOSPITAL_COMMUNITY): Payer: Self-pay | Admitting: Gastroenterology

## 2015-06-15 ENCOUNTER — Ambulatory Visit (HOSPITAL_COMMUNITY): Payer: Medicaid Other | Admitting: Physical Therapy

## 2015-06-15 ENCOUNTER — Telehealth (HOSPITAL_COMMUNITY): Payer: Self-pay

## 2015-06-15 DIAGNOSIS — C3492 Malignant neoplasm of unspecified part of left bronchus or lung: Secondary | ICD-10-CM | POA: Diagnosis not present

## 2015-06-15 DIAGNOSIS — I10 Essential (primary) hypertension: Secondary | ICD-10-CM | POA: Diagnosis not present

## 2015-06-15 DIAGNOSIS — R1310 Dysphagia, unspecified: Secondary | ICD-10-CM | POA: Diagnosis not present

## 2015-06-15 DIAGNOSIS — C3491 Malignant neoplasm of unspecified part of right bronchus or lung: Secondary | ICD-10-CM | POA: Diagnosis not present

## 2015-06-15 LAB — IRON AND TIBC
IRON: 49 ug/dL (ref 28–170)
SATURATION RATIOS: 23 % (ref 10.4–31.8)
TIBC: 213 ug/dL — ABNORMAL LOW (ref 250–450)
UIBC: 164 ug/dL

## 2015-06-15 LAB — CBC
HCT: 30.6 % — ABNORMAL LOW (ref 36.0–46.0)
HEMOGLOBIN: 9.8 g/dL — AB (ref 12.0–15.0)
MCH: 29.8 pg (ref 26.0–34.0)
MCHC: 32 g/dL (ref 30.0–36.0)
MCV: 93 fL (ref 78.0–100.0)
PLATELETS: 197 10*3/uL (ref 150–400)
RBC: 3.29 MIL/uL — AB (ref 3.87–5.11)
RDW: 19.8 % — ABNORMAL HIGH (ref 11.5–15.5)
WBC: 7.4 10*3/uL (ref 4.0–10.5)

## 2015-06-15 LAB — BASIC METABOLIC PANEL
ANION GAP: 7 (ref 5–15)
BUN: 17 mg/dL (ref 6–20)
CALCIUM: 8.6 mg/dL — AB (ref 8.9–10.3)
CO2: 25 mmol/L (ref 22–32)
Chloride: 107 mmol/L (ref 101–111)
Creatinine, Ser: 0.82 mg/dL (ref 0.44–1.00)
Glucose, Bld: 87 mg/dL (ref 65–99)
Potassium: 3.5 mmol/L (ref 3.5–5.1)
SODIUM: 139 mmol/L (ref 135–145)

## 2015-06-15 LAB — FERRITIN: FERRITIN: 289 ng/mL (ref 11–307)

## 2015-06-15 MED ORDER — ONDANSETRON HCL 4 MG PO TABS: 4.0000 mg | ORAL_TABLET | Freq: Four times a day (QID) | ORAL | Status: DC | PRN

## 2015-06-15 MED ORDER — ONDANSETRON HCL 4 MG/2ML IJ SOLN: 4.0000 mg | Freq: Four times a day (QID) | INTRAMUSCULAR | Status: DC | PRN

## 2015-06-15 MED ORDER — SODIUM CHLORIDE 0.9% FLUSH: 3.0000 mL | INTRAVENOUS | Status: DC | PRN

## 2015-06-15 MED ORDER — SODIUM CHLORIDE 0.9 % IV SOLN: 250.0000 mL | INTRAVENOUS | Status: DC | PRN

## 2015-06-15 MED ORDER — AMLODIPINE BESYLATE 5 MG PO TABS
10.0000 mg | ORAL_TABLET | Freq: Every day | ORAL | Status: DC
  Administered 2015-06-15 – 2015-06-17 (×2): 10 mg via ORAL
  Filled 2015-06-15 (×4): qty 2

## 2015-06-15 MED ORDER — SODIUM CHLORIDE 0.9% FLUSH
3.0000 mL | Freq: Two times a day (BID) | INTRAVENOUS | Status: DC
  Administered 2015-06-15 – 2015-06-16 (×4): 3 mL via INTRAVENOUS

## 2015-06-15 NOTE — Evaluation (Signed)
Clinical/Bedside Swallow Evaluation Patient Details  Name: Megan Sims MRN: [id number] Date of Birth: [birth year]  Today's Date: 06/15/2015 Time: SLP Start Time (ACUTE ONLY): 60 SLP Stop Time (ACUTE ONLY): 1730 SLP Time Calculation (min) (ACUTE ONLY): 75 min  Past Medical History:  Past Medical History  Diagnosis Date  . Migraine   . Lung abnormality   . Hypertension   . Pleurisy   . Cerebral aneurysm 2 brain surgeries 96 or 97  . Paget disease of bone   . Diarrhea   . Renal insufficiency     Patient states " no kidney problems  . S/P biopsy     of throat per patient.  . Oral thrush 09/24/2014  . Nicotine dependence 09/24/2014  . Chronic neck pain 09/24/2014  . Skin ulcer ([place]) 11/18/2014  . Emphysema of lung ([place]) 05/05/2015  . Malignant neoplasm of unknown origin ([place])   . Cancer [facility])     malignant neoplasm of unknown origin   Past Surgical History:  Past Surgical History  Procedure Laterality Date  . Chest tube insertion    . Tubal ligation    . Cerebral aneurysm repair  96 or 97  . Colonoscopy      10 years ago in [place]    HPI:  Megan Sims is a 60 yo woman who was admitted yesterday with dysphagia. She was diagnosed with bilateral lung cancer stage IIIB in Aug/Sept 2016. Followed by Dr. Alvy Bimler, oncologist. Last seen by oncology May 05, 2015. She has a history of dysphagia in Aug 2016, prompting hospital admission, which led to cancer diagnosis. 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 Presented this admission with acute onset of solid food dysphagia. She reports issues with swallowing since Mother's Day. Acute onset. Went to [employer] to eat Saturday evening. Sunday dinner couldn't "get it to go down". Would swallow solids, have to chew really well, then would regurgitate. Can swallow soft foods like cream potato, gravy. Due to progressive solid food dysphagia, presented to the ED. Last night had difficulty with  ice cream. Points to cervical neck where food "gets hung up". Water is tolerated, boost, and ensure. Coughs with eating. No abdominal pain. No N/V. No reflux symptoms. No NSAIDs. Finished chemoradiation Nov 5th. SLP consulted due to dysphagia. Pt NPO since admission yesterday.   Assessment / Plan / Recommendation Clinical Impression    I spent a good deal of time reviewing Megan Sims's medical history starting in August 2016 when she was admitted with dysphagia. MBSS completed at that time and SLP noted severe pharyngeal phase dysfunction with aspiration of liquids. She recommended ENT consult and possible SLP f/u with [facility] once she transferred to [facility]. Pt tolerated foods subjectively better over the next couple of days at [facility] and was advanced to D2 with NTL prior to discharge from the hospital. She was seen by Dr. Erik Obey (ENT) immediately post discharge in August and further testing completed including barium swallow which showed aspiration of thin liquids. Pt was then diagnosed with lung CA and seen by Dr. Alvy Bimler and Dr. Isidore Moos for chemo and radiation therapy. Unfortunately, pt never received any SLP/dysphagia follow up after she was seen in acute setting at [facility] in August. She reported subjective improvement in swallowing capabilities and was at one time treated with Fluconazole which further helped her swallow (September 2016).   Pt apparently tolerated regular foods and liquids since September. She completed radiation in November and responded well  to cancer treatments. She has had a few hospitalizations, most recently in early May with reports of some difficulty swallowing (SLP not consulted that admission) and was admitted yesterday with an acute exacerbation of dysphagia. During my assessment, pt endorsed occasional swelling of her lips, eyes, and cheeks in the past month (most recently Saturday AM). She also reports an affinity for shrimp and notes that she has been eating it more  frequently in the past month or two. She also states that she previously took Benadryl every night to help her sleep, but stopped taking it about two months ago because a physician prescribed something else for sleep. Pt reports last eating shrimp on Thursday/Friday and woke up swollen on Saturday and had difficulty swallowing on Sunday. Pt denies any difficulty breathing and never felt SOB. Note that in chart review from August 2016, pt reportedly had eaten shrimp 2-3x that week. Pt was also treated for a what was presumed to be an allergic reaction to hair dye at that time. Oral motor examination is uremarkable today, however pt shows difficulty tolerating po during trials characterized by immediate throat clearing and wet vocal quality accross consistencies and textures. Pt tolerated small bites of applesauce best during my presence and had significant difficulty with pudding (throat clearing and expectoration) and even ice chips. Recommend D2 with added gravies and honey-thick liquids during meals. OK for pt to implement [employer] Protocol (Pt may have thin WATER only after oral care and between meals, no other thin liquids). I spent a lot of time with Megan Sims and she is cognizant of "what works and what doesn't" right now. I do believe that pt has an element of dysphagia from previous cancer (and sequelae of radiation), however I suspect that something else is going on and should be investigated (ie. swelling due to allergic reaction and/or yeast?) before instrumental testing. SLP will follow up tomorrow (Wednesday) and results reviewed with pt and RN Lattie Haw Bullins). I was unable to reach Dr. Legrand Rams. MBSS cannot be completed on site due to closure of radiology suite for construction (could possibly try to assess with C-arm if necessary).     Aspiration Risk  Moderate aspiration risk    Diet Recommendation Dysphagia 2 (Fine chop);Honey-thick liquid ([place] water protocol)   Liquid Administration  via: Cup Medication Administration: Whole meds with puree Supervision: Patient able to self feed;Intermittent supervision to cue for compensatory strategies Compensations: Small sips/bites;Slow rate;Clear throat intermittently Postural Changes: Seated upright at 90 degrees;Remain upright for at least 30 minutes after po intake    Other  Recommendations Oral Care Recommendations: Oral care prior to ice chip/H20;Patient independent with oral care Other Recommendations: Clarify dietary restrictions;Order thickener from pharmacy   Follow up Recommendations  Outpatient SLP    Frequency and Duration min 2x/week  1 week       Prognosis Prognosis for Safe Diet Advancement: Fair      Swallow Study   General Date of Onset: 06/14/15 HPI: Megan Sims is a 60 yo woman who was admitted yesterday with dysphagia. She was diagnosed with bilateral lung cancer stage IIIB in Aug/Sept 2016. Followed by Dr. Alvy Bimler, oncologist. Last seen by oncology May 05, 2015. She has a history of dysphagia in Aug 2016, prompting hospital admission, which led to cancer diagnosis. 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 Presented this admission with acute onset of solid food dysphagia. She reports issues with swallowing since Mother's Day. Acute onset. Went to [employer]  [place] to eat Saturday evening. Sunday dinner couldn't "get it to go down". Would swallow solids, have to chew really well, then would regurgitate. Can swallow soft foods like cream potato, gravy. Due to progressive solid food dysphagia, presented to the ED. Last night had difficulty with ice cream. Points to cervical neck where food "gets hung up". Water is tolerated, boost, and ensure. Coughs with eating. No abdominal pain. No N/V. No reflux symptoms. No NSAIDs. Finished chemoradiation Nov 5th. SLP consulted due to dysphagia. Pt NPO since admission yesterday. Type of Study: Bedside Swallow  Evaluation Previous Swallow Assessment: August 2016 MBSS at [facility] D2/HTL Diet Prior to this Study: NPO Temperature Spikes Noted: No Respiratory Status: Room air History of Recent Intubation: No Behavior/Cognition: Alert;Cooperative;Pleasant mood Oral Cavity Assessment: Within Functional Limits Oral Care Completed by SLP: Yes Oral Cavity - Dentition: Dentures, top Vision: Functional for self-feeding Self-Feeding Abilities: Able to feed self Patient Positioning: Upright in bed Baseline Vocal Quality: Normal Volitional Cough: Strong Volitional Swallow: Able to elicit    Oral/Motor/Sensory Function Overall Oral Motor/Sensory Function: Within functional limits   Ice Chips Ice chips: Impaired Presentation: Spoon Pharyngeal Phase Impairments: Throat Clearing - Immediate   Thin Liquid Thin Liquid: Impaired Presentation: Cup;Self Fed Pharyngeal  Phase Impairments: Throat Clearing - Immediate;Cough - Immediate;Cough - Delayed    Nectar Thick Nectar Thick Liquid: Impaired Presentation: Self Fed Pharyngeal Phase Impairments: Throat Clearing - Immediate   Honey Thick Honey Thick Liquid: Impaired Presentation: Self fed Pharyngeal Phase Impairments: Throat Clearing - Delayed   Puree Puree: Within functional limits Presentation:  (tolerated applesauce but not pudding)   Solid   GO   Solid: Not tested    Functional Assessment Tool Used: clinical judgment Functional Limitations: Swallowing Swallow Current Status (W5809): At least 60 percent but less than 80 percent impaired, limited or restricted Swallow Goal Status [phone]): At least 1 percent but less than 20 percent impaired, limited or restricted   Thank you,  Genene Churn, [place] 06/15/2015,7:05 PM

## 2015-06-15 NOTE — Progress Notes (Signed)
Subjective: Patient is admitted due to dysphagia of solid food. She has no nausea or vomiting. No fever or chills.GI  Consult is requested.  Objective: Vital signs in last 24 hours: Temp:  [97.5 F (36.4 C)-98.6 F (37 C)] 98.6 F (37 C) (05/23 0545) Pulse Rate:  [79-115] 108 (05/23 0545) Resp:  [16-20] 20 (05/23 0545) BP: (132-178)/(76-100) 159/96 mmHg (05/23 0548) SpO2:  [97 %-100 %] 100 % (05/23 0545) Weight:  [51.619 kg (113 lb 12.8 oz)-54.885 kg (121 lb)] 51.619 kg (113 lb 12.8 oz) (05/23 0027) Weight change:  Last BM Date: 06/15/15  Intake/Output from previous day:    PHYSICAL EXAM General appearance: alert and no distress Resp: clear to auscultation bilaterally Cardio: S1, S2 normal GI: soft, non-tender; bowel sounds normal; no masses,  no organomegaly Extremities: extremities normal, atraumatic, no cyanosis or edema  Lab Results:  Results for orders placed or performed during the hospital encounter of 06/14/15 (from the past 48 hour(s))  CBC with Differential     Status: Abnormal   Collection Time: 06/14/15  1:21 PM  Result Value Ref Range   WBC 9.4 4.0 - 10.5 K/uL   RBC 3.80 (L) 3.87 - 5.11 MIL/uL   Hemoglobin 11.6 (L) 12.0 - 15.0 g/dL   HCT 35.5 (L) 36.0 - 46.0 %   MCV 93.4 78.0 - 100.0 fL   MCH 30.5 26.0 - 34.0 pg   MCHC 32.7 30.0 - 36.0 g/dL   RDW 19.8 (H) 11.5 - 15.5 %   Platelets 214 150 - 400 K/uL   Neutrophils Relative % 80 %   Neutro Abs 7.5 1.7 - 7.7 K/uL   Lymphocytes Relative 12 %   Lymphs Abs 1.1 0.7 - 4.0 K/uL   Monocytes Relative 7 %   Monocytes Absolute 0.7 0.1 - 1.0 K/uL   Eosinophils Relative 1 %   Eosinophils Absolute 0.1 0.0 - 0.7 K/uL   Basophils Relative 0 %   Basophils Absolute 0.0 0.0 - 0.1 K/uL  Basic metabolic panel     Status: Abnormal   Collection Time: 06/14/15  1:21 PM  Result Value Ref Range   Sodium 140 135 - 145 mmol/L   Potassium 3.7 3.5 - 5.1 mmol/L   Chloride 106 101 - 111 mmol/L   CO2 26 22 - 32 mmol/L   Glucose,  Bld 96 65 - 99 mg/dL   BUN 21 (H) 6 - 20 mg/dL   Creatinine, Ser 0.81 0.44 - 1.00 mg/dL   Calcium 9.4 8.9 - 10.3 mg/dL   GFR calc non Af Amer >60 >60 mL/min   GFR calc Af Amer >60 >60 mL/min    Comment: (NOTE) The eGFR has been calculated using the CKD EPI equation. This calculation has not been validated in all clinical situations. eGFR's persistently <60 mL/min signify possible Chronic Kidney Disease.    Anion gap 8 5 - 15  Hepatic function panel     Status: Abnormal   Collection Time: 06/14/15  7:00 PM  Result Value Ref Range   Total Protein 6.4 (L) 6.5 - 8.1 g/dL   Albumin 3.3 (L) 3.5 - 5.0 g/dL   AST 38 15 - 41 U/L   ALT 15 14 - 54 U/L   Alkaline Phosphatase 97 38 - 126 U/L   Total Bilirubin 0.6 0.3 - 1.2 mg/dL   Bilirubin, Direct 0.1 0.1 - 0.5 mg/dL   Indirect Bilirubin 0.5 0.3 - 0.9 mg/dL  Lipase, blood     Status: None   Collection  Time: 06/14/15  7:00 PM  Result Value Ref Range   Lipase 14 11 - 51 U/L  I-Stat CG4 Lactic Acid, ED     Status: None   Collection Time: 06/14/15  7:12 PM  Result Value Ref Range   Lactic Acid, Venous 1.74 0.5 - 2.0 mmol/L  I-Stat CG4 Lactic Acid, ED     Status: None   Collection Time: 06/14/15 10:31 PM  Result Value Ref Range   Lactic Acid, Venous 0.94 0.5 - 2.0 mmol/L    ABGS No results for input(s): PHART, PO2ART, TCO2, HCO3 in the last 72 hours.  Invalid input(s): PCO2 CULTURES No results found for this or any previous visit (from the past 240 hour(s)). Studies/Results: Dg Chest 2 View  06/14/2015  CLINICAL DATA:  Acute onset of difficulty swallowing and vomiting. Sore throat. Initial encounter. EXAM: CHEST  2 VIEW COMPARISON:  Chest radiograph performed 05/24/2015 FINDINGS: The lungs are well-aerated. Patchy right midlung airspace opacities raise concern for pneumonia. This is new from the recent prior study. There is no evidence of pleural effusion or pneumothorax. The heart is normal in size; the mediastinal contour is within  normal limits. A left-sided chest port is noted ending about the mid SVC. No acute osseous abnormalities are seen. IMPRESSION: Patchy right-sided airspace opacities, new from the recent prior study, raise concern for pneumonia. Electronically Signed   By: Garald Balding M.D.   On: 06/14/2015 18:32   Ct Head Wo Contrast  06/14/2015  CLINICAL DATA:  Initial evaluation for acute difficulty swelling. EXAM: CT HEAD WITHOUT CONTRAST TECHNIQUE: Contiguous axial images were obtained from the base of the skull through the vertex without intravenous contrast. COMPARISON:  Prior studies from 06/14/2015. FINDINGS: Postoperative changes from prior bilateral craniotomies again seen. 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 Encephalomalacia of within the anterior right temporal pole. Changes are stable. Atrophy with chronic microvascular ischemic disease. Remote lacunar infarct within the left caudate head. Additional remote lacunar infarct within the right basal ganglia. No acute intracranial hemorrhage. No definite acute large vessel territory infarct, although evaluation somewhat limited by streak artifact from aneurysm clips. No mass lesion, midline shift, or mass effect. No hydrocephalus. No extra-axial fluid collection. Scalp soft tissues within normal limits. No acute abnormality about the orbits. Paranasal sinuses are clear. Remote defect at the right lamina papyracea noted. No mastoid effusion. No acute abnormality about the calvarium.  Post craniotomy changes. IMPRESSION: 1. No acute intracranial process. 2. Stable postoperative changes, generalized cerebral atrophy, and chronic small vessel ischemic disease. 3. 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 Electronically Signed   By: Jeannine Boga M.D.   On: 06/14/2015 21:12   Ct Soft Tissue Neck W Contrast  06/14/2015  CLINICAL DATA:  60 year old female with history of lung cancer. Difficulty swallowing and sore throat  for the past week and a half. Subsequent encounter. EXAM: CT NECK WITH CONTRAST TECHNIQUE: Multidetector CT imaging of the neck was performed using the standard protocol following the bolus administration of intravenous contrast. CONTRAST:  36m ISOVUE-300 IOPAMIDOL (ISOVUE-300) INJECTION 61% COMPARISON:  06/14/2015 chest x-ray. CT of the chest performed same date is dictated separately. FINDINGS: Pharynx and larynx: Symmetric mild prominence of palatine tonsils may reflect result of mild inflammation. No deep drainable abscess noted. Salivary glands: No mass or inflammation noted. Thyroid: No mass identified. Lymph nodes: Scattered normal to top-normal size lymph nodes. Vascular: Bilateral carotid bifurcation calcifications with hemodynamically significant stenosis suspected but incompletely assessed. Limited intracranial:  Negative. Visualized orbits: Not imaged. Mastoids and visualized paranasal sinuses: Portions visualized clear. Skeleton: No osseous destructive lesion. Mild cervical spondylotic changes. Upper chest: No apical mass. Please see chest CT report dictated separately. IMPRESSION: Symmetric mild prominence of palatine tonsils may reflect result of mild inflammation. Bilateral carotid bifurcation calcifications with hemodynamically significant stenosis suspected but incompletely assessed. Chest CT report dictated separately. Electronically Signed   By: Genia Del M.D.   On: 06/14/2015 20:07   Ct Chest Wo Contrast  06/14/2015  CLINICAL DATA:  Abnormal chest radiograph. Assess pneumonia. Personal history of lung cancer. Initial encounter. EXAM: CT CHEST WITHOUT CONTRAST TECHNIQUE: Multidetector CT imaging of the chest was performed following the standard protocol without IV contrast. COMPARISON:  CTA of the chest performed 05/03/2015, and chest radiograph performed earlier today at 6:23 p.m. FINDINGS: Mild focal opacities along the right minor fissure and at the right upper lobe are similar in  appearance to prior CTA, though less hazy, but are significantly more prominent on chest radiograph. This may be projectional on chest radiograph, or may reflect interval redistribution of atypical infection superimposed on chronic scarring. The left lung appears relatively clear. No pleural effusion or pneumothorax is seen. No dominant mass is seen to suggest recurrent malignancy. Diffuse coronary artery calcifications are seen. Trace pericardial fluid remains within normal limits. No mediastinal lymphadenopathy is seen. No pericardial effusion is identified. Scattered calcification is noted along the aortic arch and proximal great vessels. A left-sided chest port is noted ending about the mid SVC. The visualized portions of the thyroid gland are unremarkable. No axillary lymphadenopathy is seen. The visualized portions of the liver are unremarkable. The spleen is diminutive. The visualized portions of the gallbladder, pancreas, adrenal glands and kidneys are within normal limits. No acute osseous abnormalities are identified. 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 IMPRESSION: 1. Small focal opacities along the right minor fissure and at the right upper lobe are similar in appearance to the prior CTA, though less hazy. However, these are significantly more prominent on the corresponding chest radiograph. This may be projectional on chest radiograph, or may reflect interval redistribution of atypical infection, superimposed on chronic scarring. 2. No definite evidence for recurrence of malignancy. 3. Diffuse coronary artery calcifications seen. 4. 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 Electronically Signed   By: Garald Balding M.D.   On: 06/14/2015 20:01    Medications: I have reviewed the patient's current medications.  Assesment:   Principal Problem:   Dysphagia Active Problems:   Essential hypertension   Bilateral lung cancer (HCC)   Oral thrush    Plan:   Medications reviewed Will keep NPO for now  GI consult.      Linard Daft 06/15/2015, 8:11 AM

## 2015-06-15 NOTE — H&P (Signed)
PCP:   FANTA,TESFAYE, MD   Chief Complaint:  Trouble swallowing  HPI: 60 yo female h/o lung cancer s/p XRT and chemo end of last year comes in with one week of progressive worsening dysphagia.  She feels like food gets stuck and she has to vomit it up, mostly happens with solids.  Does not happen with fluids or soft foods.  No abdominal pain.  No h/o esophageal issues before except she did have esophagitis a couple of years ago.  Pt does say she coughs sometimes when she eats.  Denies sob, no fevers.  Overall feels well except for this dyphagia.  Referred for admission for work up of her swallowing issues.  Review of Systems:  Positive and negative as per HPI otherwise all other systems are negative  Past Medical History: Past Medical History  Diagnosis Date  . Migraine   . Lung abnormality   . Hypertension   . Pleurisy   . Cerebral aneurysm 2 brain surgeries 96 or 97  . Paget disease of bone   . Diarrhea   . Renal insufficiency     Patient states " no kidney problems  . S/P biopsy     of throat per patient.  . Oral thrush 09/24/2014  . Nicotine dependence 09/24/2014  . Chronic neck pain 09/24/2014  . Skin ulcer ([place]) 11/18/2014  . Emphysema of lung ([place]) 05/05/2015  . Malignant neoplasm of unknown origin ([place])   . Cancer [facility])     malignant neoplasm of unknown origin   Past Surgical History  Procedure Laterality Date  . Chest tube insertion    . Tubal ligation    . Cerebral aneurysm repair  96 or 97  . Colonoscopy      Medications: Prior to Admission medications   Medication Sig Start Date End Date Taking? Authorizing Provider  amLODipine (NORVASC) 10 MG tablet Take 1 tablet (10 mg total) by mouth daily. 11/11/14  Yes Heath Lark, MD  chlorpheniramine-HYDROcodone (TUSSIONEX) 10-8 MG/5ML SUER Take 5 mLs by mouth every 12 (twelve) hours as needed for cough.  05/05/15  Yes Historical Provider, MD  EPINEPHrine 0.3 mg/0.3 mL IJ SOAJ injection Inject 0.3 mLs (0.3 mg total) into  the muscle once. 09/12/14  Yes Cherene Altes, MD  hydrOXYzine (ATARAX/VISTARIL) 25 MG tablet Take 1 tablet (25 mg total) by mouth 3 (three) times daily. Patient taking differently: Take 25 mg by mouth every 8 (eight) hours as needed for itching.  06/01/15  Yes Rosita Fire, MD  zolpidem (AMBIEN) 10 MG tablet Take 1 tablet (10 mg total) by mouth at bedtime as needed for sleep. 06/01/15  Yes Rosita Fire, MD  predniSONE (STERAPRED UNI-PAK 21 TAB) 10 MG (21) TBPK tablet Take 1 tablet (10 mg total) by mouth daily. 3 tab po daily for 3 days, 2 tab po daily for 3 days, 21 tab po daily for 3 days then stop Patient not taking: Reported on 06/14/2015 06/01/15   Rosita Fire, MD    Allergies:  No Known Allergies  Social History:  reports that she has been smoking Cigarettes.  She has a 17 pack-year smoking history. She has never used smokeless tobacco. She reports that she does not drink alcohol or use illicit drugs.  Family History: Family History  Problem Relation Age of Onset  . Hypertension    . Diabetes    . Kidney disease    . Cancer Mother     throat ca  . Cancer Maternal Grandmother  thyroid ca    Physical Exam:. Filed Vitals:   06/14/15 1915 06/14/15 2122 06/14/15 2259 06/15/15 0027  BP: 165/78 159/92 132/76 162/80  Pulse: 79 97 96 99  Temp:    98.2 F (36.8 C)  TempSrc:    Oral  Resp: '16 16 16 18  '$ Height:    '5\' 1"'$  (1.549 m)  Weight:      SpO2: 100% 98% 100% 100%   General appearance: alert, cooperative and no distress Head: Normocephalic, without obvious abnormality, atraumatic Eyes: negative Nose: Nares normal. Septum midline. Mucosa normal. No drainage or sinus tenderness. Neck: no JVD and supple, symmetrical, trachea midline Lungs: clear to auscultation bilaterally Heart: regular rate and rhythm, S1, S2 normal, no murmur, click, rub or gallop Abdomen: soft, non-tender; bowel sounds normal; no masses,  no organomegaly Extremities: extremities normal, atraumatic, no  cyanosis or edema Pulses: 2+ and symmetric Skin: Skin color, texture, turgor normal. No rashes or lesions Neurologic: Grossly normal    Labs on Admission:   Recent Labs  06/14/15 1321  NA 140  K 3.7  CL 106  CO2 26  GLUCOSE 96  BUN 21*  CREATININE 0.81  CALCIUM 9.4    Recent Labs  06/14/15 1900  AST 38  ALT 15  ALKPHOS 97  BILITOT 0.6  PROT 6.4*  ALBUMIN 3.3*    Recent Labs  06/14/15 1900  LIPASE 14    Recent Labs  06/14/15 1321  WBC 9.4  NEUTROABS 7.5  HGB 11.6*  HCT 35.5*  MCV 93.4  PLT 214     Radiological Exams on Admission: Dg Chest 2 View  06/14/2015  CLINICAL DATA:  Acute onset of difficulty swallowing and vomiting. Sore throat. Initial encounter. EXAM: CHEST  2 VIEW COMPARISON:  Chest radiograph performed 05/24/2015 FINDINGS: The lungs are well-aerated. Patchy right midlung airspace opacities raise concern for pneumonia. This is new from the recent prior study. There is no evidence of pleural effusion or pneumothorax. The heart is normal in size; the mediastinal contour is within normal limits. A left-sided chest port is noted ending about the mid SVC. No acute osseous abnormalities are seen. IMPRESSION: Patchy right-sided airspace opacities, new from the recent prior study, raise concern for pneumonia. Electronically Signed   By: Garald Balding M.D.   On: 06/14/2015 18:32   Ct Head Wo Contrast  06/14/2015  CLINICAL DATA:  Initial evaluation for acute difficulty swelling. EXAM: CT HEAD WITHOUT CONTRAST TECHNIQUE: Contiguous axial images were obtained from the base of the skull through the vertex without intravenous contrast. COMPARISON:  Prior studies from 06/14/2015. FINDINGS: Postoperative changes from prior bilateral craniotomies again seen. 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 Encephalomalacia of within the anterior right temporal pole. Changes are stable. Atrophy with chronic microvascular ischemic disease. Remote lacunar infarct  within the left caudate head. Additional remote lacunar infarct within the right basal ganglia. No acute intracranial hemorrhage. No definite acute large vessel territory infarct, although evaluation somewhat limited by streak artifact from aneurysm clips. No mass lesion, midline shift, or mass effect. No hydrocephalus. No extra-axial fluid collection. Scalp soft tissues within normal limits. No acute abnormality about the orbits. Paranasal sinuses are clear. Remote defect at the right lamina papyracea noted. No mastoid effusion. No acute abnormality about the calvarium.  Post craniotomy changes. IMPRESSION: 1. No acute intracranial process. 2. Stable postoperative changes, generalized cerebral atrophy, and chronic small vessel ischemic disease. 3. 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 Electronically Signed  By: Jeannine Boga M.D.   On: 06/14/2015 21:12   Ct Soft Tissue Neck W Contrast  06/14/2015  CLINICAL DATA:  60 year old female with history of lung cancer. Difficulty swallowing and sore throat for the past week and a half. Subsequent encounter. EXAM: CT NECK WITH CONTRAST TECHNIQUE: Multidetector CT imaging of the neck was performed using the standard protocol following the bolus administration of intravenous contrast. CONTRAST:  56m ISOVUE-300 IOPAMIDOL (ISOVUE-300) INJECTION 61% COMPARISON:  06/14/2015 chest x-ray. CT of the chest performed same date is dictated separately. FINDINGS: Pharynx and larynx: Symmetric mild prominence of palatine tonsils may reflect result of mild inflammation. No deep drainable abscess noted. Salivary glands: No mass or inflammation noted. Thyroid: No mass identified. Lymph nodes: Scattered normal to top-normal size lymph nodes. Vascular: Bilateral carotid bifurcation calcifications with hemodynamically significant stenosis suspected but incompletely assessed. Limited intracranial: Negative. Visualized orbits: Not imaged. Mastoids and  visualized paranasal sinuses: Portions visualized clear. Skeleton: No osseous destructive lesion. Mild cervical spondylotic changes. Upper chest: No apical mass. Please see chest CT report dictated separately. IMPRESSION: Symmetric mild prominence of palatine tonsils may reflect result of mild inflammation. Bilateral carotid bifurcation calcifications with hemodynamically significant stenosis suspected but incompletely assessed. Chest CT report dictated separately. Electronically Signed   By: SGenia DelM.D.   On: 06/14/2015 20:07   Ct Chest Wo Contrast  06/14/2015  CLINICAL DATA:  Abnormal chest radiograph. Assess pneumonia. Personal history of lung cancer. Initial encounter. EXAM: CT CHEST WITHOUT CONTRAST TECHNIQUE: Multidetector CT imaging of the chest was performed following the standard protocol without IV contrast. COMPARISON:  CTA of the chest performed 05/03/2015, and chest radiograph performed earlier today at 6:23 p.m. FINDINGS: Mild focal opacities along the right minor fissure and at the right upper lobe are similar in appearance to prior CTA, though less hazy, but are significantly more prominent on chest radiograph. This may be projectional on chest radiograph, or may reflect interval redistribution of atypical infection superimposed on chronic scarring. The left lung appears relatively clear. No pleural effusion or pneumothorax is seen. No dominant mass is seen to suggest recurrent malignancy. Diffuse coronary artery calcifications are seen. Trace pericardial fluid remains within normal limits. No mediastinal lymphadenopathy is seen. No pericardial effusion is identified. Scattered calcification is noted along the aortic arch and proximal great vessels. A left-sided chest port is noted ending about the mid SVC. The visualized portions of the thyroid gland are unremarkable. No axillary lymphadenopathy is seen. The visualized portions of the liver are unremarkable. The spleen is diminutive. The  visualized portions of the gallbladder, pancreas, adrenal glands and kidneys are within normal limits. No acute osseous abnormalities are identified. 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 IMPRESSION: 1. Small focal opacities along the right minor fissure and at the right upper lobe are similar in appearance to the prior CTA, though less hazy. However, these are significantly more prominent on the corresponding chest radiograph. This may be projectional on chest radiograph, or may reflect interval redistribution of atypical infection, superimposed on chronic scarring. 2. No definite evidence for recurrence of malignancy. 3. Diffuse coronary artery calcifications seen. 4. 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 Electronically Signed   By: JGarald BaldingM.D.   On: 06/14/2015 20:01    Assessment/Plan  60yo female with h/o radiation /chemo for lung cancer comes in with dysphagia  Principal Problem:   Dysphagia-  Will check MBSS in am and consult GI for  consideration of EGD.  Keep npo tonight.  Active Problems:   Essential hypertension- noted   Bilateral lung cancer ([place])- noted, completed treatment at this time   obs on medical.  Full code.  PCP dr Legrand Rams.    DAVID,RACHAL A 06/15/2015, 1:50 AM

## 2015-06-15 NOTE — Progress Notes (Signed)
Dr. Shanon Brow notified via text page of patient's arrival to room 306.

## 2015-06-15 NOTE — Telephone Encounter (Signed)
Patient was admitted to [facility]

## 2015-06-15 NOTE — Care Management Note (Signed)
Case Management Note  Patient Details  Name: GERALINE HALBERSTADT MRN: [id number] Date of Birth: 13-Jul-1955  Subjective/Objective Pt is here for dysphagia, pending Speech consult. Patient is from home, has family support. Pt is independent with needs.    Has no issues getting to appointments or obtaining meds.         Action/Plan: No CM needs known at this time, will cont. To follow.   Expected Discharge Date:  06/17/15               Expected Discharge Plan:  Home/Self Care  In-House Referral:     Discharge planning Services  CM Consult  Post Acute Care Choice:  NA Choice offered to:  NA  DME Arranged:    DME Agency:     HH Arranged:    HH Agency:     Status of Service:  In process, will continue to follow  Medicare Important Message Given:    Date Medicare IM Given:    Medicare IM give by:    Date Additional Medicare IM Given:    Additional Medicare Important Message give by:     If discussed at [place] of Stay Meetings, dates discussed:    Additional Comments:  Lonya Johannesen, Chauncey [place], RN 06/15/2015, 1:58 PM

## 2015-06-15 NOTE — Consult Note (Signed)
Referring Provider: Dr. Derrill Kay Primary Care Physician:  Rosita Fire, MD Primary Gastroenterologist:  Dr. Oneida Alar   Date of Admission: 06/14/15 Date of Consultation: 06/15/15  Reason for Consultation:  Dysphagia   HPI:  Megan Sims is a 60 y.o. year old female diagnosed with bilateral lung cancer stage IIIB in Aug/Sept 2016. Followed by Dr. Alvy Bimler, oncologist. Last seen by oncology May 05, 2015. She has a history of dysphagia in Aug 2016, prompting hospital admission, which led to cancer diagnosis. She was seen by ENT in late August. Speech evaluation in early August, with moderate to severe pharyngeal phase dysphagia, and she subsequently failed a barium swallow due to aspiration. Presented this admission with acute onset of solid food dysphagia.   Doesn't believe she has ever had an upper endoscopy. Issues with swallowing since Mother's Day. Acute onset. Went to [employer] to eat Saturday evening. 'Sunday dinner couldn\'t "get it to go down". Would swallow solids, have to chew really well, then would regurgitate. Can swallow soft foods like cream potato, gravy. Soft foods are tolerated. Monday tried to eat a steak biscuit from [employer] but took a long time to eat and swallow. Due to progressive solid food dysphagia, presented to the ED. Last night had difficulty with ice cream. Points to cervical neck where food "gets hung up". Water is tolerated, boost, and ensure. Coughs with eating. No abdominal pain. No N/V. No reflux symptoms. No NSAIDs. Finished chemoradiation Nov 5th.     Last colonoscopy about 10 years ago in [place].   Past Medical History  Diagnosis Date  . Migraine   . Lung abnormality   . Hypertension   . Pleurisy   . Cerebral aneurysm 2 brain surgeries 96 or 97  . Paget disease of bone   . Diarrhea   . Renal insufficiency     Patient states " no kidney problems  . S/P biopsy     of throat per patient.  . Oral thrush 09/24/2014  . Nicotine dependence  09/24/2014  . Chronic neck pain 09/24/2014  . Skin ulcer (HCC) 11/18/2014  . Emphysema of lung (HCC) 05/05/2015  . Malignant neoplasm of unknown origin (HCC)   . Cancer (HCC)     malignant neoplasm of unknown origin    Past Surgical History  Procedure Laterality Date  . Chest tube insertion    . Tubal ligation    . Cerebral aneurysm repair  96 or 97  . Colonoscopy      10'$  years ago in [place]     Prior to Admission medications   Medication Sig Start Date End Date Taking? Authorizing Provider  amLODipine (NORVASC) 10 MG tablet Take 1 tablet (10 mg total) by mouth daily. 11/11/14  Yes Heath Lark, MD  chlorpheniramine-HYDROcodone (TUSSIONEX) 10-8 MG/5ML SUER Take 5 mLs by mouth every 12 (twelve) hours as needed for cough.  05/05/15  Yes Historical Provider, MD  EPINEPHrine 0.3 mg/0.3 mL IJ SOAJ injection Inject 0.3 mLs (0.3 mg total) into the muscle once. 09/12/14  Yes Cherene Altes, MD  hydrOXYzine (ATARAX/VISTARIL) 25 MG tablet Take 1 tablet (25 mg total) by mouth 3 (three) times daily. Patient taking differently: Take 25 mg by mouth every 8 (eight) hours as needed for itching.  06/01/15  Yes Rosita Fire, MD  zolpidem (AMBIEN) 10 MG tablet Take 1 tablet (10 mg total) by mouth at bedtime as needed for sleep. 06/01/15  Yes Rosita Fire, MD  predniSONE (STERAPRED UNI-PAK 21 TAB) 10 MG (21) TBPK tablet  Take 1 tablet (10 mg total) by mouth daily. 3 tab po daily for 3 days, 2 tab po daily for 3 days, 21 tab po daily for 3 days then stop Patient not taking: Reported on 06/14/2015 06/01/15   Rosita Fire, MD    Current Facility-Administered Medications  Medication Dose Route Frequency Provider Last Rate Last Dose  . 0.9 %  sodium chloride infusion  250 mL Intravenous PRN Phillips Grout, MD      . amLODipine (NORVASC) tablet 10 mg  10 mg Oral Daily Phillips Grout, MD      . ondansetron [facility]) tablet 4 mg  4 mg Oral Q6H PRN Phillips Grout, MD       Or  . ondansetron (ZOFRAN) injection 4 mg  4  mg Intravenous Q6H PRN Phillips Grout, MD      . sodium chloride flush (NS) 0.9 % injection 3 mL  3 mL Intravenous Q12H Phillips Grout, MD      . sodium chloride flush (NS) 0.9 % injection 3 mL  3 mL Intravenous PRN Phillips Grout, MD        Allergies as of 06/14/2015  . (No Known Allergies)    Family History  Problem Relation Age of Onset  . Hypertension    . Diabetes    . Kidney disease    . Cancer Mother     throat ca  . Cancer Maternal Grandmother     thyroid ca  . Colon cancer Neg Hx     Social History   Social History  . Marital Status: Married    Spouse Name: N/A  . Number of Children: N/A  . Years of Education: N/A   Occupational History  . Not on file.   Social History Main Topics  . Smoking status: Former Smoker -- 0.50 packs/day for 34 years    Types: Cigarettes  . Smokeless tobacco: Never Used     Comment: HOWEVER, will smoke 1 cigarette if "tragic" event (death), or if she needs it for her nerves   . Alcohol Use: No  . Drug Use: No  . Sexual Activity: Not on file   Other Topics Concern  . Not on file   Social History Narrative    Review of Systems: Gen: +weight loss  CV: Denies chest pain, heart palpitations, syncope, edema  Resp: +cough  GI: see HPI  GU : Denies urinary burning, urinary frequency, urinary incontinence.  MS: Denies joint pain,swelling, cramping Derm: Denies rash, itching, dry skin Psych: Denies depression, anxiety,confusion, or memory loss Heme: Denies bruising, bleeding, and enlarged lymph nodes.  Physical Exam: Vital signs in last 24 hours: Temp:  [97.5 F (36.4 C)-98.6 F (37 C)] 98.6 F (37 C) (05/23 0545) Pulse Rate:  [79-115] 108 (05/23 0545) Resp:  [16-20] 20 (05/23 0545) BP: (132-178)/(76-100) 159/96 mmHg (05/23 0548) SpO2:  [97 %-100 %] 100 % (05/23 0545) Weight:  [113 lb 12.8 oz (51.619 kg)-121 lb (54.885 kg)] 113 lb 12.8 oz (51.619 kg) (05/23 0027) Last BM Date: 06/15/15 General:   Alert, thin but not  cachectic appearing, no distress Head:  Normocephalic and atraumatic. Eyes:  Sclera clear, no icterus.   Conjunctiva pink. Ears:  Normal auditory acuity. Nose:  No deformity, discharge,  or lesions. Mouth:  No deformity or lesions Lungs:  Scattered rhonchi, faint expiratory wheeze Heart:  Regular rate and rhythm; no murmurs, clicks, rubs,  or gallops. Abdomen:  Soft, nontender and nondistended. No masses, hepatosplenomegaly or  hernias noted. Normal bowel sounds, without guarding, and without rebound.   Rectal:  Deferred until time of colonoscopy.   Msk:  Symmetrical without gross deformities. Normal posture. Extremities:  Without edema. Neurologic:  Alert and  oriented x4;  grossly normal neurologically. Psych:  Alert and cooperative. Normal mood and affect.  Intake/Output from previous day:   Intake/Output this shift:    Lab Results:  Recent Labs  06/14/15 1321 06/15/15 0721  WBC 9.4 7.4  HGB 11.6* 9.8*  HCT 35.5* 30.6*  PLT 214 197   BMET  Recent Labs  06/14/15 1321 06/15/15 0721  NA 140 139  K 3.7 3.5  CL 106 107  CO2 26 25  GLUCOSE 96 87  BUN 21* 17  CREATININE 0.81 0.82  CALCIUM 9.4 8.6*   LFT  Recent Labs  06/14/15 1900  PROT 6.4*  ALBUMIN 3.3*  AST 38  ALT 15  ALKPHOS 97  BILITOT 0.6  BILIDIR 0.1  IBILI 0.5    Studies/Results: Dg Chest 2 View  06/14/2015  CLINICAL DATA:  Acute onset of difficulty swallowing and vomiting. Sore throat. Initial encounter. EXAM: CHEST  2 VIEW COMPARISON:  Chest radiograph performed 05/24/2015 FINDINGS: The lungs are well-aerated. Patchy right midlung airspace opacities raise concern for pneumonia. This is new from the recent prior study. There is no evidence of pleural effusion or pneumothorax. The heart is normal in size; the mediastinal contour is within normal limits. A left-sided chest port is noted ending about the mid SVC. No acute osseous abnormalities are seen. IMPRESSION: Patchy right-sided airspace  opacities, new from the recent prior study, raise concern for pneumonia. Electronically Signed   By: Garald Balding M.D.   On: 06/14/2015 18:32   Ct Head Wo Contrast  06/14/2015  CLINICAL DATA:  Initial evaluation for acute difficulty swelling. EXAM: CT HEAD WITHOUT CONTRAST TECHNIQUE: Contiguous axial images were obtained from the base of the skull through the vertex without intravenous contrast. COMPARISON:  Prior studies from 06/14/2015. FINDINGS: Postoperative changes from prior bilateral craniotomies again seen. 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 Encephalomalacia of within the anterior right temporal pole. Changes are stable. Atrophy with chronic microvascular ischemic disease. Remote lacunar infarct within the left caudate head. Additional remote lacunar infarct within the right basal ganglia. No acute intracranial hemorrhage. No definite acute large vessel territory infarct, although evaluation somewhat limited by streak artifact from aneurysm clips. No mass lesion, midline shift, or mass effect. No hydrocephalus. No extra-axial fluid collection. Scalp soft tissues within normal limits. No acute abnormality about the orbits. Paranasal sinuses are clear. Remote defect at the right lamina papyracea noted. No mastoid effusion. No acute abnormality about the calvarium.  Post craniotomy changes. IMPRESSION: 1. No acute intracranial process. 2. Stable postoperative changes, generalized cerebral atrophy, and chronic small vessel ischemic disease. 3. 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 Electronically Signed   By: Jeannine Boga M.D.   On: 06/14/2015 21:12   Ct Soft Tissue Neck W Contrast  06/14/2015  CLINICAL DATA:  60 year old female with history of lung cancer. Difficulty swallowing and sore throat for the past week and a half. Subsequent encounter. EXAM: CT NECK WITH CONTRAST TECHNIQUE: Multidetector CT imaging of the neck was performed using the  standard protocol following the bolus administration of intravenous contrast. CONTRAST:  72m ISOVUE-300 IOPAMIDOL (ISOVUE-300) INJECTION 61% COMPARISON:  06/14/2015 chest x-ray. CT of the chest performed same date is dictated separately. FINDINGS: Pharynx and larynx: Symmetric mild prominence of  palatine tonsils may reflect result of mild inflammation. No deep drainable abscess noted. Salivary glands: No mass or inflammation noted. Thyroid: No mass identified. Lymph nodes: Scattered normal to top-normal size lymph nodes. Vascular: Bilateral carotid bifurcation calcifications with hemodynamically significant stenosis suspected but incompletely assessed. Limited intracranial: Negative. Visualized orbits: Not imaged. Mastoids and visualized paranasal sinuses: Portions visualized clear. Skeleton: No osseous destructive lesion. Mild cervical spondylotic changes. Upper chest: No apical mass. Please see chest CT report dictated separately. IMPRESSION: Symmetric mild prominence of palatine tonsils may reflect result of mild inflammation. Bilateral carotid bifurcation calcifications with hemodynamically significant stenosis suspected but incompletely assessed. Chest CT report dictated separately. Electronically Signed   By: Genia Del M.D.   On: 06/14/2015 20:07   Ct Chest Wo Contrast  06/14/2015  CLINICAL DATA:  Abnormal chest radiograph. Assess pneumonia. Personal history of lung cancer. Initial encounter. EXAM: CT CHEST WITHOUT CONTRAST TECHNIQUE: Multidetector CT imaging of the chest was performed following the standard protocol without IV contrast. COMPARISON:  CTA of the chest performed 05/03/2015, and chest radiograph performed earlier today at 6:23 p.m. FINDINGS: Mild focal opacities along the right minor fissure and at the right upper lobe are similar in appearance to prior CTA, though less hazy, but are significantly more prominent on chest radiograph. This may be projectional on chest radiograph, or may  reflect interval redistribution of atypical infection superimposed on chronic scarring. The left lung appears relatively clear. No pleural effusion or pneumothorax is seen. No dominant mass is seen to suggest recurrent malignancy. Diffuse coronary artery calcifications are seen. Trace pericardial fluid remains within normal limits. No mediastinal lymphadenopathy is seen. No pericardial effusion is identified. Scattered calcification is noted along the aortic arch and proximal great vessels. A left-sided chest port is noted ending about the mid SVC. The visualized portions of the thyroid gland are unremarkable. No axillary lymphadenopathy is seen. The visualized portions of the liver are unremarkable. The spleen is diminutive. The visualized portions of the gallbladder, pancreas, adrenal glands and kidneys are within normal limits. No acute osseous abnormalities are identified. 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 IMPRESSION: 1. Small focal opacities along the right minor fissure and at the right upper lobe are similar in appearance to the prior CTA, though less hazy. However, these are significantly more prominent on the corresponding chest radiograph. This may be projectional on chest radiograph, or may reflect interval redistribution of atypical infection, superimposed on chronic scarring. 2. No definite evidence for recurrence of malignancy. 3. Diffuse coronary artery calcifications seen. 4. 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 Electronically Signed   By: Garald Balding M.D.   On: 06/14/2015 20:01    Impression: 60 year old female with history of bilateral lung cancer (Aug/Spet 2016), finishing chemoradiation in Nov 2016, presenting with recurrent acute onset of solid food dysphagia. CXR raising concern for pneumonia.  Known history of dysphagia, failing barium swallow in Aug 2016 and noting to have pharyngeal component during MBSS by Speech. Tolerating liquids and soft  foods; however, she has been coughing with taking pills in with water. No prior EGD. Speech Consultation has been requested; we will await these findings, and she will likely need an upper endoscopy in the near future. Speech Consultation likely later this afternoon: discussed this with Genene Churn, who will see if another Speech Pathologist can evaluate patient earlier today if possible. Her symptoms appear to be more oropharyngeal but unable to exclude an occult esophageal component. Anemia  noted, which is likely multifactorial and secondary to chronic disease.   Plan: Await Speech Consultation Anticipate EGD in near future Remain NPO except for ice chips until speech recommendations received Check iron studies Outpatient colonoscopy for screening purposes, non-urgent   Orvil Feil, ANP-BC [facility]        06/15/2015, 9:57 AM

## 2015-06-16 ENCOUNTER — Encounter (HOSPITAL_COMMUNITY)
Admission: EM | Disposition: A | Discharge status: Home / Self Care | Payer: Self-pay | Source: Home / Self Care | Attending: Emergency Medicine

## 2015-06-16 ENCOUNTER — Encounter (HOSPITAL_COMMUNITY): Payer: Self-pay | Admitting: *Deleted

## 2015-06-16 DIAGNOSIS — R1310 Dysphagia, unspecified: Secondary | ICD-10-CM | POA: Diagnosis not present

## 2015-06-16 DIAGNOSIS — K3189 Other diseases of stomach and duodenum: Secondary | ICD-10-CM

## 2015-06-16 HISTORY — PX: ESOPHAGOGASTRODUODENOSCOPY: SHX1529

## 2015-06-16 HISTORY — PX: ESOPHAGOGASTRODUODENOSCOPY: SHX5428

## 2015-06-16 LAB — KOH PREP

## 2015-06-16 SURGERY — EGD (ESOPHAGOGASTRODUODENOSCOPY)
Anesthesia: Moderate Sedation

## 2015-06-16 MED ORDER — MIDAZOLAM HCL 5 MG/5ML IJ SOLN
INTRAMUSCULAR | Status: AC
  Filled 2015-06-16: qty 10

## 2015-06-16 MED ORDER — ONDANSETRON HCL 4 MG/2ML IJ SOLN
INTRAMUSCULAR | Status: AC
  Filled 2015-06-16: qty 2

## 2015-06-16 MED ORDER — SODIUM CHLORIDE 0.9% FLUSH
INTRAVENOUS | Status: AC
  Administered 2015-06-16: 16:00:00
  Filled 2015-06-16: qty 10

## 2015-06-16 MED ORDER — MEPERIDINE HCL 100 MG/ML IJ SOLN
INTRAMUSCULAR | Status: AC
  Filled 2015-06-16: qty 2

## 2015-06-16 MED ORDER — MIDAZOLAM HCL 5 MG/5ML IJ SOLN
INTRAMUSCULAR | Status: DC | PRN
  Administered 2015-06-16: 2 mg via INTRAVENOUS
  Administered 2015-06-16: 1 mg via INTRAVENOUS

## 2015-06-16 MED ORDER — ONDANSETRON HCL 4 MG/2ML IJ SOLN
INTRAMUSCULAR | Status: DC | PRN
  Administered 2015-06-16: 4 mg via INTRAVENOUS

## 2015-06-16 MED ORDER — LIDOCAINE VISCOUS 2 % MT SOLN
OROMUCOSAL | Status: DC | PRN
  Administered 2015-06-16: 3 mL via OROMUCOSAL

## 2015-06-16 MED ORDER — SODIUM CHLORIDE 0.9 % IJ SOLN
INTRAMUSCULAR | Status: DC | PRN
  Administered 2015-06-16: 3 mL via INTRAVENOUS

## 2015-06-16 MED ORDER — STERILE WATER FOR IRRIGATION IR SOLN
Status: DC | PRN
  Administered 2015-06-16: 15:00:00

## 2015-06-16 MED ORDER — LIDOCAINE VISCOUS 2 % MT SOLN
OROMUCOSAL | Status: AC
Start: 2015-06-16 — End: 2015-06-17
  Filled 2015-06-16: qty 15

## 2015-06-16 MED ORDER — MEPERIDINE HCL 100 MG/ML IJ SOLN
INTRAMUSCULAR | Status: DC | PRN
  Administered 2015-06-16: 50 mg via INTRAVENOUS
  Administered 2015-06-16: 25 mg via INTRAVENOUS

## 2015-06-16 MED ORDER — HEPARIN SOD (PORK) LOCK FLUSH 100 UNIT/ML IV SOLN
INTRAVENOUS | Status: AC
  Filled 2015-06-16: qty 5

## 2015-06-16 MED ORDER — SODIUM CHLORIDE 0.9 % IV SOLN: INTRAVENOUS | Status: DC

## 2015-06-16 MED ORDER — SUCRALFATE 1 GM/10ML PO SUSP
1.0000 g | Freq: Three times a day (TID) | ORAL | Status: DC
  Administered 2015-06-16 – 2015-06-17 (×3): 1 g via ORAL
  Filled 2015-06-16 (×3): qty 10

## 2015-06-16 MED ORDER — SODIUM CHLORIDE 0.9 % IV SOLN
INTRAVENOUS | Status: DC
  Administered 2015-06-16: 15:00:00 via INTRAVENOUS

## 2015-06-16 NOTE — Progress Notes (Signed)
Subjective: Patient is resting. She is scheduled for endoscopy today. Patient was evaluated by speech therapist and recommendation is made..  Objective: Vital signs in last 24 hours: Temp:  [97.8 F (36.6 C)-98.4 F (36.9 C)] 98.4 F (36.9 C) (05/24 0600) Pulse Rate:  [13-110] 13 (05/24 0600) Resp:  [16-20] 16 (05/24 0600) BP: (106-136)/(65-91) 127/91 mmHg (05/24 0600) SpO2:  [98 %-100 %] 98 % (05/24 0600) Weight change:  Last BM Date: 06/15/15  Intake/Output from previous day: 05/23 0701 - 05/24 0700 In: 3 [I.V.:3] Out: -   PHYSICAL EXAM General appearance: alert and no distress Resp: clear to auscultation bilaterally Cardio: S1, S2 normal GI: soft, non-tender; bowel sounds normal; no masses,  no organomegaly Extremities: extremities normal, atraumatic, no cyanosis or edema  Lab Results:  Results for orders placed or performed during the hospital encounter of 06/14/15 (from the past 48 hour(s))  CBC with Differential     Status: Abnormal   Collection Time: 06/14/15  1:21 PM  Result Value Ref Range   WBC 9.4 4.0 - 10.5 K/uL   RBC 3.80 (L) 3.87 - 5.11 MIL/uL   Hemoglobin 11.6 (L) 12.0 - 15.0 g/dL   HCT 35.5 (L) 36.0 - 46.0 %   MCV 93.4 78.0 - 100.0 fL   MCH 30.5 26.0 - 34.0 pg   MCHC 32.7 30.0 - 36.0 g/dL   RDW 19.8 (H) 11.5 - 15.5 %   Platelets 214 150 - 400 K/uL   Neutrophils Relative % 80 %   Neutro Abs 7.5 1.7 - 7.7 K/uL   Lymphocytes Relative 12 %   Lymphs Abs 1.1 0.7 - 4.0 K/uL   Monocytes Relative 7 %   Monocytes Absolute 0.7 0.1 - 1.0 K/uL   Eosinophils Relative 1 %   Eosinophils Absolute 0.1 0.0 - 0.7 K/uL   Basophils Relative 0 %   Basophils Absolute 0.0 0.0 - 0.1 K/uL  Basic metabolic panel     Status: Abnormal   Collection Time: 06/14/15  1:21 PM  Result Value Ref Range   Sodium 140 135 - 145 mmol/L   Potassium 3.7 3.5 - 5.1 mmol/L   Chloride 106 101 - 111 mmol/L   CO2 26 22 - 32 mmol/L   Glucose, Bld 96 65 - 99 mg/dL   BUN 21 (H) 6 - 20 mg/dL    Creatinine, Ser 0.81 0.44 - 1.00 mg/dL   Calcium 9.4 8.9 - 10.3 mg/dL   GFR calc non Af Amer >60 >60 mL/min   GFR calc Af Amer >60 >60 mL/min    Comment: (NOTE) The eGFR has been calculated using the CKD EPI equation. This calculation has not been validated in all clinical situations. eGFR's persistently <60 mL/min signify possible Chronic Kidney Disease.    Anion gap 8 5 - 15  Hepatic function panel     Status: Abnormal   Collection Time: 06/14/15  7:00 PM  Result Value Ref Range   Total Protein 6.4 (L) 6.5 - 8.1 g/dL   Albumin 3.3 (L) 3.5 - 5.0 g/dL   AST 38 15 - 41 U/L   ALT 15 14 - 54 U/L   Alkaline Phosphatase 97 38 - 126 U/L   Total Bilirubin 0.6 0.3 - 1.2 mg/dL   Bilirubin, Direct 0.1 0.1 - 0.5 mg/dL   Indirect Bilirubin 0.5 0.3 - 0.9 mg/dL  Lipase, blood     Status: None   Collection Time: 06/14/15  7:00 PM  Result Value Ref Range   Lipase 14  11 - 51 U/L  I-Stat CG4 Lactic Acid, ED     Status: None   Collection Time: 06/14/15  7:12 PM  Result Value Ref Range   Lactic Acid, Venous 1.74 0.5 - 2.0 mmol/L  I-Stat CG4 Lactic Acid, ED     Status: None   Collection Time: 06/14/15 10:31 PM  Result Value Ref Range   Lactic Acid, Venous 0.94 0.5 - 2.0 mmol/L  Basic metabolic panel     Status: Abnormal   Collection Time: 06/15/15  7:21 AM  Result Value Ref Range   Sodium 139 135 - 145 mmol/L   Potassium 3.5 3.5 - 5.1 mmol/L   Chloride 107 101 - 111 mmol/L   CO2 25 22 - 32 mmol/L   Glucose, Bld 87 65 - 99 mg/dL   BUN 17 6 - 20 mg/dL   Creatinine, Ser 0.82 0.44 - 1.00 mg/dL   Calcium 8.6 (L) 8.9 - 10.3 mg/dL   GFR calc non Af Amer >60 >60 mL/min   GFR calc Af Amer >60 >60 mL/min    Comment: (NOTE) The eGFR has been calculated using the CKD EPI equation. This calculation has not been validated in all clinical situations. eGFR's persistently <60 mL/min signify possible Chronic Kidney Disease.    Anion gap 7 5 - 15  CBC     Status: Abnormal   Collection Time:  06/15/15  7:21 AM  Result Value Ref Range   WBC 7.4 4.0 - 10.5 K/uL   RBC 3.29 (L) 3.87 - 5.11 MIL/uL   Hemoglobin 9.8 (L) 12.0 - 15.0 g/dL   HCT 30.6 (L) 36.0 - 46.0 %   MCV 93.0 78.0 - 100.0 fL   MCH 29.8 26.0 - 34.0 pg   MCHC 32.0 30.0 - 36.0 g/dL   RDW 19.8 (H) 11.5 - 15.5 %   Platelets 197 150 - 400 K/uL  Ferritin     Status: None   Collection Time: 06/15/15  7:21 AM  Result Value Ref Range   Ferritin 289 11 - 307 ng/mL    Comment: Performed at [facility]  Iron and TIBC     Status: Abnormal   Collection Time: 06/15/15  7:21 AM  Result Value Ref Range   Iron 49 28 - 170 ug/dL   TIBC 213 (L) 250 - 450 ug/dL   Saturation Ratios 23 10.4 - 31.8 %   UIBC 164 ug/dL    Comment: Performed at [facility]    ABGS No results for input(s): PHART, PO2ART, TCO2, HCO3 in the last 72 hours.  Invalid input(s): PCO2 CULTURES No results found for this or any previous visit (from the past 240 hour(s)). Studies/Results: Dg Chest 2 View  06/14/2015  CLINICAL DATA:  Acute onset of difficulty swallowing and vomiting. Sore throat. Initial encounter. EXAM: CHEST  2 VIEW COMPARISON:  Chest radiograph performed 05/24/2015 FINDINGS: The lungs are well-aerated. Patchy right midlung airspace opacities raise concern for pneumonia. This is new from the recent prior study. There is no evidence of pleural effusion or pneumothorax. The heart is normal in size; the mediastinal contour is within normal limits. A left-sided chest port is noted ending about the mid SVC. No acute osseous abnormalities are seen. IMPRESSION: Patchy right-sided airspace opacities, new from the recent prior study, raise concern for pneumonia. Electronically Signed   By: Garald Balding M.D.   On: 06/14/2015 18:32   Ct Head Wo Contrast  06/14/2015  CLINICAL DATA:  Initial evaluation for acute difficulty  swelling. EXAM: CT HEAD WITHOUT CONTRAST TECHNIQUE: Contiguous axial images were obtained from the base of the skull  through the vertex without intravenous contrast. COMPARISON:  Prior studies from 06/14/2015. FINDINGS: Postoperative changes from prior bilateral craniotomies again seen. 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 Encephalomalacia of within the anterior right temporal pole. Changes are stable. Atrophy with chronic microvascular ischemic disease. Remote lacunar infarct within the left caudate head. Additional remote lacunar infarct within the right basal ganglia. No acute intracranial hemorrhage. No definite acute large vessel territory infarct, although evaluation somewhat limited by streak artifact from aneurysm clips. No mass lesion, midline shift, or mass effect. No hydrocephalus. No extra-axial fluid collection. Scalp soft tissues within normal limits. No acute abnormality about the orbits. Paranasal sinuses are clear. Remote defect at the right lamina papyracea noted. No mastoid effusion. No acute abnormality about the calvarium.  Post craniotomy changes. IMPRESSION: 1. No acute intracranial process. 2. Stable postoperative changes, generalized cerebral atrophy, and chronic small vessel ischemic disease. 3. 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 Electronically Signed   By: Jeannine Boga M.D.   On: 06/14/2015 21:12   Ct Soft Tissue Neck W Contrast  06/14/2015  CLINICAL DATA:  60 year old female with history of lung cancer. Difficulty swallowing and sore throat for the past week and a half. Subsequent encounter. EXAM: CT NECK WITH CONTRAST TECHNIQUE: Multidetector CT imaging of the neck was performed using the standard protocol following the bolus administration of intravenous contrast. CONTRAST:  35m ISOVUE-300 IOPAMIDOL (ISOVUE-300) INJECTION 61% COMPARISON:  06/14/2015 chest x-ray. CT of the chest performed same date is dictated separately. FINDINGS: Pharynx and larynx: Symmetric mild prominence of palatine tonsils may reflect result of mild inflammation. No  deep drainable abscess noted. Salivary glands: No mass or inflammation noted. Thyroid: No mass identified. Lymph nodes: Scattered normal to top-normal size lymph nodes. Vascular: Bilateral carotid bifurcation calcifications with hemodynamically significant stenosis suspected but incompletely assessed. Limited intracranial: Negative. Visualized orbits: Not imaged. Mastoids and visualized paranasal sinuses: Portions visualized clear. Skeleton: No osseous destructive lesion. Mild cervical spondylotic changes. Upper chest: No apical mass. Please see chest CT report dictated separately. IMPRESSION: Symmetric mild prominence of palatine tonsils may reflect result of mild inflammation. Bilateral carotid bifurcation calcifications with hemodynamically significant stenosis suspected but incompletely assessed. Chest CT report dictated separately. Electronically Signed   By: SGenia DelM.D.   On: 06/14/2015 20:07   Ct Chest Wo Contrast  06/14/2015  CLINICAL DATA:  Abnormal chest radiograph. Assess pneumonia. Personal history of lung cancer. Initial encounter. EXAM: CT CHEST WITHOUT CONTRAST TECHNIQUE: Multidetector CT imaging of the chest was performed following the standard protocol without IV contrast. COMPARISON:  CTA of the chest performed 05/03/2015, and chest radiograph performed earlier today at 6:23 p.m. FINDINGS: Mild focal opacities along the right minor fissure and at the right upper lobe are similar in appearance to prior CTA, though less hazy, but are significantly more prominent on chest radiograph. This may be projectional on chest radiograph, or may reflect interval redistribution of atypical infection superimposed on chronic scarring. The left lung appears relatively clear. No pleural effusion or pneumothorax is seen. No dominant mass is seen to suggest recurrent malignancy. Diffuse coronary artery calcifications are seen. Trace pericardial fluid remains within normal limits. No mediastinal  lymphadenopathy is seen. No pericardial effusion is identified. Scattered calcification is noted along the aortic arch and proximal great vessels. A left-sided chest port is noted ending about the mid SVC. The visualized  portions of the thyroid gland are unremarkable. No axillary lymphadenopathy is seen. The visualized portions of the liver are unremarkable. The spleen is diminutive. The visualized portions of the gallbladder, pancreas, adrenal glands and kidneys are within normal limits. No acute osseous abnormalities are identified. 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 IMPRESSION: 1. Small focal opacities along the right minor fissure and at the right upper lobe are similar in appearance to the prior CTA, though less hazy. However, these are significantly more prominent on the corresponding chest radiograph. This may be projectional on chest radiograph, or may reflect interval redistribution of atypical infection, superimposed on chronic scarring. 2. No definite evidence for recurrence of malignancy. 3. Diffuse coronary artery calcifications seen. 4. 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 Electronically Signed   By: Garald Balding M.D.   On: 06/14/2015 20:01    Medications: I have reviewed the patient's current medications.  Assesment:   Principal Problem:   Dysphagia Active Problems:   Essential hypertension   Bilateral lung cancer ([place])   Oral thrush    Plan:  Medications reviewed GI consult appreciated As per GI plan Speech pathologist evaluation noted      Benton Tooker 06/16/2015, 8:26 AM

## 2015-06-16 NOTE — Op Note (Signed)
[facility] Patient Name: Megan Sims Procedure Date: 06/16/2015 3:08 PM MRN: [id number] Date of Birth: Mar 30, 1955 Attending MD: Norvel Richards , MD CSN: [id number] Age: 60 Admit Type: Outpatient Procedure:                Upper GI endoscopy ith esophageal brushing, gastric                            and duodenal biopsy Indications:              dysphagia Providers:                Norvel Richards, MD, Gwenlyn Fudge, RN, Georgeann Oppenheim, Technician Referring MD:              Medicines:                Versed 3 mg IV and.                           Meperidine 50 mg IV, Ondansetron 4 mg IV Complications:            No immediate complications. Estimated Blood Loss:     Estimated blood loss was minimal. Procedure:                Pre-Anesthesia Assessment:                           - Prior to the procedure, a History and Physical                            was performed, and patient medications and                            allergies were reviewed. The patient's tolerance of                            previous anesthesia was also reviewed. The risks                            and benefits of the procedure and the sedation                            options and risks were discussed with the patient.                            All questions were answered, and informed consent                            was obtained. ASA Grade Assessment: II - A patient                            with mild systemic disease. After reviewing the  risks and benefits, the patient was deemed in                            satisfactory condition to undergo the procedure.                           After obtaining informed consent, the endoscope was                            passed under direct vision. Throughout the                            procedure, the patient's blood pressure, pulse, and                            oxygen saturations were monitored  continuously. The                            [id number] ([id number]) scope was introduced through the                            mouth, and advanced to the second part of duodenum.                            The upper GI endoscopy was accomplished without                            difficulty. The patient tolerated the procedure                            well. Scope In: 3:16:36 PM Scope Out: 3:28:57 PM Total Procedure Duration: 0 hours 12 minutes 21 seconds  Findings:      The cricopharyngeus was swollen. I easily traversed the scope. No       foreign body or other mucosal abnormality seen. plaques in the       esophagus. gastric mucosa eroded diffusely. Duodenal bulb eroded with       pseudodiverticula.      Multiple dispersed, bleeding erosions were found in the entire examined       stomach. There were no stigmata of recent bleeding. This was biopsied       with a cold forceps for histology. Estimated blood loss was minimal.      Multiple erosions without bleeding were found in the duodenal bulb. This       was biopsied with a cold forceps for histology. Estimated blood loss was       minimal. esophageal brush for KO Impression:               - edematous cricopharyngeus?"likely a result of                            trauma (query fishbone impaction versus other                            foreign body; pill-induced injury not excluded  either). Onset of symptoms fits with timing of                            eating fish with bones 8 days ago. Likely explains                            dysphagia symptoms.Esophageal plaque status post                            biopsy. Gastric and duodenal erosions?"status post                            biopsy Moderate Sedation:      Moderate (conscious) sedation was personally administered by an       anesthesia professional. The following parameters were monitored: oxygen       saturation, heart rate, blood pressure,  respiratory rate, EKG, adequacy       of pulmonary ventilation, and response to care. Total physician       intraservice time was 17 minutes. Recommendation:           - Return patient to hospital ward for ongoing care.                           Dysphagia 1 diet. Carafate suspension. Follow up on                            pending studies. If no improvement in swallowing in                            the next 24-48 hours, would consider neck films to                            look further for an occult foreign body.                           - Nectar thick liquids diet today.                           - Continue present medications. Procedure Code(s):        --- Professional ---                           [phone], Esophagogastroduodenoscopy, flexible,                            transoral; with biopsy, single or multiple Diagnosis Code(s):        --- Professional ---                           R13.10, Dysphagia, unspecified CPT copyright 2016 [employer]. All rights reserved. The codes documented in this report are preliminary and upon coder review may  be revised to meet current compliance requirements. Cristopher Estimable. Alison Kubicki, MD Norvel Richards, MD 06/16/2015 4:26:35 PM This report has been signed electronically. Number of Addenda: 0

## 2015-06-16 NOTE — Progress Notes (Signed)
SLP Cancellation Note  Patient Details Name: Megan Sims MRN: [id number] DOB: [birth year]   Cancelled treatment:       Reason Eval/Treat Not Completed: Other (comment) (Pt NPO pending EGD later this afternoon) SLP saw Pt this AM who is now NPO pending EGD later this afternoon. Pt reports consuming ~6 containers of applesauce last night after my clinical swallow evaluation. She also consumed mashed potatoes with gravy (but stated she needed more gravy to thin out) and tolerated well. She did not drink the thickened liquids, but did consume thin water after oral care (per instructions) and she did not eat after midnight. Pt c/o left ear pain this AM, otherwise doing well. SLP still questions whether there is an allergy and/or thrush component to her dysphagia as she has times of exacerbation and remittance. Unfortunately, radiology suite for MBSS is down for construction so pt may need to go to [facility] as an outpatient for [facility] or [facility]ES if dysphagia persists. SLP will follow per direction of MD.  Thank you,  Genene Churn, [place]    [place] 06/16/2015, 10:56 AM

## 2015-06-16 NOTE — Progress Notes (Signed)
Spoke with patient this morning. Patient agrees to EGD only at this time. She would like to decide about PEG only after she knows results of EGD. Orders changed to EGD only.

## 2015-06-17 ENCOUNTER — Telehealth: Payer: Self-pay | Admitting: Gastroenterology

## 2015-06-17 ENCOUNTER — Encounter: Payer: Self-pay | Admitting: Gastroenterology

## 2015-06-17 DIAGNOSIS — B3781 Candidal esophagitis: Secondary | ICD-10-CM | POA: Diagnosis not present

## 2015-06-17 DIAGNOSIS — K3189 Other diseases of stomach and duodenum: Secondary | ICD-10-CM | POA: Diagnosis not present

## 2015-06-17 DIAGNOSIS — D6489 Other specified anemias: Secondary | ICD-10-CM | POA: Diagnosis not present

## 2015-06-17 DIAGNOSIS — R1310 Dysphagia, unspecified: Secondary | ICD-10-CM | POA: Diagnosis not present

## 2015-06-17 MED ORDER — HEPARIN SOD (PORK) LOCK FLUSH 100 UNIT/ML IV SOLN
500.0000 [IU] | INTRAVENOUS | Status: AC | PRN
  Administered 2015-06-17: 500 [IU]
  Filled 2015-06-17: qty 5

## 2015-06-17 MED ORDER — SUCRALFATE 1 GM/10ML PO SUSP: 1.0000 g | Freq: Three times a day (TID) | ORAL | Status: DC

## 2015-06-17 MED ORDER — FLUCONAZOLE 200 MG PO TABS: ORAL_TABLET | ORAL | Status: DC

## 2015-06-17 NOTE — Care Management Note (Signed)
Case Management Note  Patient Details  Name: MARILEE DITOMMASO MRN: [id number] Date of Birth: Aug 30, 1955  Subjective/Objective:                    Action/Plan:   Expected Discharge Date:  06/17/15               Expected Discharge Plan:  Home/Self Care  In-House Referral:     Discharge planning Services  CM Consult  Post Acute Care Choice:  NA Choice offered to:  NA  DME Arranged:    DME Agency:     HH Arranged:    HH Agency:     Status of Service:  Completed, signed off  Medicare Important Message Given:    Date Medicare IM Given:    Medicare IM give by:    Date Additional Medicare IM Given:    Additional Medicare Important Message give by:     If discussed at Accident of Stay Meetings, dates discussed:    Additional Comments Patient discharging today. Prior to arrival patient receiving outpatient PT through [facility]. Being recommend for outpatient SLP also. Referral for outpatient SLP faxed. No other CM needs.   Nychelle Cassata, Chauncey Reading, RN 06/17/2015, 10:34 AM

## 2015-06-17 NOTE — Progress Notes (Signed)
Subjective:  Able to swallow liquids. Chose not to try pured diet because it was "disgusting".   Objective: Vital signs in last 24 hours: Temp:  [97.8 F (36.6 C)-98.8 F (37.1 C)] 97.8 F (36.6 C) (05/25 0620) Pulse Rate:  [87-110] 100 (05/25 0620) Resp:  [14-31] 18 (05/25 0620) BP: (90-159)/(56-86) 138/71 mmHg (05/25 0620) SpO2:  [91 %-100 %] 92 % (05/25 0620) Last BM Date: 06/16/15 General:   Alert,  Well-developed, well-nourished, pleasant and cooperative in NAD Head:  Normocephalic and atraumatic. Eyes:  Sclera clear, no icterus.  Abdomen:  Soft, nontender and nondistended.  Normal bowel sounds, without guarding, and without rebound.   Extremities:  Without clubbing, deformity or edema. Neurologic:  Alert and  oriented x4;  grossly normal neurologically. Skin:  Intact without significant lesions or rashes. Psych:  Alert and cooperative. Normal mood and affect.  Intake/Output from previous day: 05/24 0701 - 05/25 0700 In: 640 [P.O.:240; I.V.:400] Out: -  Intake/Output this shift:    Lab Results: CBC  Recent Labs  06/14/15 1321 06/15/15 0721  WBC 9.4 7.4  HGB 11.6* 9.8*  HCT 35.5* 30.6*  MCV 93.4 93.0  PLT 214 197   BMET  Recent Labs  06/14/15 1321 06/15/15 0721  NA 140 139  K 3.7 3.5  CL 106 107  CO2 26 25  GLUCOSE 96 87  BUN 21* 17  CREATININE 0.81 0.82  CALCIUM 9.4 8.6*   LFTs  Recent Labs  06/14/15 1900  BILITOT 0.6  BILIDIR 0.1  IBILI 0.5  ALKPHOS 97  AST 38  ALT 15  PROT 6.4*  ALBUMIN 3.3*    Recent Labs  06/14/15 1900  LIPASE 14   PT/INR No results for input(s): LABPROT, INR in the last 72 hours.    Imaging Studies: Dg Chest 2 View  06/14/2015  CLINICAL DATA:  Acute onset of difficulty swallowing and vomiting. Sore throat. Initial encounter. EXAM: CHEST  2 VIEW COMPARISON:  Chest radiograph performed 05/24/2015 FINDINGS: The lungs are well-aerated. Patchy right midlung airspace opacities raise concern for pneumonia. This is  new from the recent prior study. There is no evidence of pleural effusion or pneumothorax. The heart is normal in size; the mediastinal contour is within normal limits. A left-sided chest port is noted ending about the mid SVC. No acute osseous abnormalities are seen. IMPRESSION: Patchy right-sided airspace opacities, new from the recent prior study, raise concern for pneumonia. Electronically Signed   By: Garald Balding M.D.   On: 06/14/2015 18:32   Dg Chest 2 View  05/24/2015  CLINICAL DATA:  Shortness of breath for 2 weeks. History of lung carcinoma EXAM: CHEST  2 VIEW COMPARISON:  Chest radiograph and chest CT May 03, 2015 FINDINGS: There is scarring right upper lobe, stable. There is no frank edema or consolidation. Heart size and pulmonary vascularity are normal. No adenopathy. Port-A-Cath tip is in the superior vena cava. No pneumothorax. No bone lesions are evident. There is degenerative change in the thoracic spine. 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 IMPRESSION: Scarring right upper lobe. No edema or consolidation. 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 Electronically Signed   By: Lowella Grip III M.D.   On: 05/24/2015 19:38   Ct Head Wo Contrast  06/14/2015  CLINICAL DATA:  Initial evaluation for acute difficulty swelling. EXAM: CT HEAD WITHOUT CONTRAST TECHNIQUE: Contiguous axial images were obtained from the base of the skull through the vertex without intravenous  contrast. COMPARISON:  Prior studies from 06/14/2015. FINDINGS: Postoperative changes from prior bilateral craniotomies again seen. 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 Encephalomalacia of within the anterior right temporal pole. Changes are stable. Atrophy with chronic microvascular ischemic disease. Remote lacunar infarct within the left caudate head. Additional remote lacunar infarct within the right basal ganglia. No acute intracranial hemorrhage. No  definite acute large vessel territory infarct, although evaluation somewhat limited by streak artifact from aneurysm clips. No mass lesion, midline shift, or mass effect. No hydrocephalus. No extra-axial fluid collection. Scalp soft tissues within normal limits. No acute abnormality about the orbits. Paranasal sinuses are clear. Remote defect at the right lamina papyracea noted. No mastoid effusion. No acute abnormality about the calvarium.  Post craniotomy changes. IMPRESSION: 1. No acute intracranial process. 2. Stable postoperative changes, generalized cerebral atrophy, and chronic small vessel ischemic disease. 3. 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 Electronically Signed   By: Jeannine Boga M.D.   On: 06/14/2015 21:12   Ct Soft Tissue Neck W Contrast  06/14/2015  CLINICAL DATA:  60 year old female with history of lung cancer. Difficulty swallowing and sore throat for the past week and a half. Subsequent encounter. EXAM: CT NECK WITH CONTRAST TECHNIQUE: Multidetector CT imaging of the neck was performed using the standard protocol following the bolus administration of intravenous contrast. CONTRAST:  45m ISOVUE-300 IOPAMIDOL (ISOVUE-300) INJECTION 61% COMPARISON:  06/14/2015 chest x-ray. CT of the chest performed same date is dictated separately. FINDINGS: Pharynx and larynx: Symmetric mild prominence of palatine tonsils may reflect result of mild inflammation. No deep drainable abscess noted. Salivary glands: No mass or inflammation noted. Thyroid: No mass identified. Lymph nodes: Scattered normal to top-normal size lymph nodes. Vascular: Bilateral carotid bifurcation calcifications with hemodynamically significant stenosis suspected but incompletely assessed. Limited intracranial: Negative. Visualized orbits: Not imaged. Mastoids and visualized paranasal sinuses: Portions visualized clear. Skeleton: No osseous destructive lesion. Mild cervical spondylotic changes.  Upper chest: No apical mass. Please see chest CT report dictated separately. IMPRESSION: Symmetric mild prominence of palatine tonsils may reflect result of mild inflammation. Bilateral carotid bifurcation calcifications with hemodynamically significant stenosis suspected but incompletely assessed. Chest CT report dictated separately. Electronically Signed   By: SGenia DelM.D.   On: 06/14/2015 20:07   Ct Chest Wo Contrast  06/14/2015  CLINICAL DATA:  Abnormal chest radiograph. Assess pneumonia. Personal history of lung cancer. Initial encounter. EXAM: CT CHEST WITHOUT CONTRAST TECHNIQUE: Multidetector CT imaging of the chest was performed following the standard protocol without IV contrast. COMPARISON:  CTA of the chest performed 05/03/2015, and chest radiograph performed earlier today at 6:23 p.m. FINDINGS: Mild focal opacities along the right minor fissure and at the right upper lobe are similar in appearance to prior CTA, though less hazy, but are significantly more prominent on chest radiograph. This may be projectional on chest radiograph, or may reflect interval redistribution of atypical infection superimposed on chronic scarring. The left lung appears relatively clear. No pleural effusion or pneumothorax is seen. No dominant mass is seen to suggest recurrent malignancy. Diffuse coronary artery calcifications are seen. Trace pericardial fluid remains within normal limits. No mediastinal lymphadenopathy is seen. No pericardial effusion is identified. Scattered calcification is noted along the aortic arch and proximal great vessels. A left-sided chest port is noted ending about the mid SVC. The visualized portions of the thyroid gland are unremarkable. No axillary lymphadenopathy is seen. The visualized portions of the liver are unremarkable. The spleen is diminutive.  The visualized portions of the gallbladder, pancreas, adrenal glands and kidneys are within normal limits. No acute osseous abnormalities  are identified. 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 IMPRESSION: 1. Small focal opacities along the right minor fissure and at the right upper lobe are similar in appearance to the prior CTA, though less hazy. However, these are significantly more prominent on the corresponding chest radiograph. This may be projectional on chest radiograph, or may reflect interval redistribution of atypical infection, superimposed on chronic scarring. 2. No definite evidence for recurrence of malignancy. 3. Diffuse coronary artery calcifications seen. 4. 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 Electronically Signed   By: Garald Balding M.D.   On: 06/14/2015 20:01  [2 weeks]   Assessment: 60 year old female with history of bilateral lung cancer (August/September 2016), finishing chemoradiation in November 2016, presenting with recurrent acute onset of solid food dysphagia. Also with anemia, likely multifactorial and secondary chronic disease +/- occult gi bleeding from stomach.  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 Likely explains dysphagia. Esophageal plaques status post biopsy. Gastric and duodenal erosions status post biopsy.   CT of the neck 06/14/2015 with symmetric mild prominence of the palatine tonsils, no other acute findings.  Plan: 1. Follow-up biopsies. 2. Dysphagia 1 diet. 3. Carafate suspension. 4. Add Diflucan '400mg'$  on day 1, '200mg'$  for 20 days. RX sent to pharmacy. 5. Return to the office in 2 weeks for follow up of anemia, dysphagia, ?colonoscopy.   Megan Sims. Bernarda Caffey [facility] [phone] 5/25/20179:56 AM

## 2015-06-17 NOTE — Telephone Encounter (Signed)
APPT MADE AND LETTER SENT  °

## 2015-06-17 NOTE — Telephone Encounter (Signed)
Needs OV in 2 weeks with [facility], [name], or me.

## 2015-06-17 NOTE — Progress Notes (Signed)
Discharged PT per MD order and protocol. Discharge handouts reviewed/explained. Education completed.  Prescriptions given. Pt verbalized understanding and left with all belongings. VSS. Heparin locked port.   Patient wheeled down by staff member.

## 2015-06-21 ENCOUNTER — Encounter: Payer: Self-pay | Admitting: Internal Medicine

## 2015-06-22 HISTORY — PX: LARYNGOSCOPY: SUR817

## 2015-06-23 ENCOUNTER — Other Ambulatory Visit: Payer: Self-pay | Admitting: Otolaryngology

## 2015-06-23 DIAGNOSIS — R1319 Other dysphagia: Secondary | ICD-10-CM

## 2015-06-23 DIAGNOSIS — R1310 Dysphagia, unspecified: Secondary | ICD-10-CM

## 2015-06-24 DIAGNOSIS — R29898 Other symptoms and signs involving the musculoskeletal system: Secondary | ICD-10-CM

## 2015-06-24 HISTORY — DX: Other symptoms and signs involving the musculoskeletal system: R29.898

## 2015-06-24 NOTE — Progress Notes (Addendum)
Radiation Oncology         [phone] ________________________________  Name: Megan Sims MRN: [id number]  Date: 06/25/2015  DOB: March 21, 1955  Follow-Up Visit Note  Outpatient  CC: Megan Fire, MD  Megan Lark, MD  Diagnosis and Prior Radiotherapy:    ICD-9-CM ICD-10-CM   1. 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 60 Gy in 30 fractions.  Narrative:  Megan Sims is here for follow up of radiation completed 11/25/2014 to her Chest/ Right Supraclavicular area. She reports having trouble swallowing. She says "food just won't go down", but she is not having pain. She is able to get liquids down (water,Ensure). She has visited Megan Sims early this week, and he plans to perform a  Barium Swallow 06/28/15. She is not eating well and has been losing weight. She does report itching all over her body which may be related to [employer] and a rash is present. She denies any shortness of breath. She does report that she becomes tired very easily and relates this to decreased nutritional intake. She has recently been admitted to the hospital for dehydration and pneumonia. 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  No new back pain  On 06/16/15 she had an upper GI endoscopy - per notes by Megan Sims: - edematous cricopharyngeus likely a result of trauma (query  fishbone impaction versus other   foreign body; pill-induced injury not excluded   either). Onset of symptoms fits with timing of   eating fish with bones 8 days ago. Likely explains   dysphagia symptoms.Esophageal plaque status post   biopsy. Gastric and duodenal erosions status post   biopsy  PATH RESULTS 1. Duodenum, NOS biopsy - ACUTE DUODENITIS. - NO DYSPLASIA OR MALIGNANCY IDENTIFIED. - SEE COMMENT. 2. Stomach, biopsy - BENIGN GASTRIC MUCOSA SHOWING CHRONIC INACTIVE GASTRITIS. - WARTHIN-STARRY STAIN IS NEGATIVE FOR HELICOBACTER PYLORI. - NO INTESTINAL METAPLASIA, DYSPLASIA, OR MALIGNANCY.                               ALLERGIES:  has No Known Allergies.  Meds: Current Outpatient Prescriptions  Medication Sig Dispense Refill  . amLODipine (NORVASC) 10 MG tablet Take 1 tablet (10 mg total) by mouth daily. 30 tablet 6  . chlorpheniramine-HYDROcodone (TUSSIONEX) 10-8 MG/5ML SUER Take 5 mLs by mouth every 12 (twelve) hours as needed for cough.   0  . fluconazole (DIFLUCAN) 200 MG tablet Take two ('400mg'$ ) tabs on day 1, then continue one tab ('200mg'$ ) for 20 days. 22 tablet 0  . hydrOXYzine (ATARAX/VISTARIL) 25 MG tablet Take 1 tablet (25 mg total) by mouth 3 (three) times daily. (Patient taking differently: Take 25 mg by mouth every 8 (eight) hours as needed for itching. ) 30 tablet 0  . sucralfate (CARAFATE) 1 GM/10ML suspension Take 10 mLs (1 g total) by mouth 4 (four)  times daily -  with meals and at bedtime. 420 mL 0  . zolpidem (AMBIEN) 10 MG tablet Take 1 tablet (10 mg total) by mouth at bedtime as needed for sleep. 30 tablet 0   No current facility-administered medications for this encounter.    Physical Findings: The patient is in no acute distress. Patient is alert and oriented.  height is '5\' 1"'$  (1.549 m) and weight is  117 lb 6.4 oz (53.252 kg). Her temperature is 97.8 F (36.6 C). Her blood pressure is 134/84 and her pulse is 104. Her oxygen saturation is 100%. .     Wt Readings from Last 3 Encounters:  06/25/15 117 lb 6.4 oz (53.252 kg)  06/15/15 113 lb 12.8 oz (51.619 kg)  05/25/15 121 lb 8 oz (55.112 kg)   -Heart is slightly tachycardic but regular rhythm - Coarse breath sounds in the right base and right apex of the lung -Normal bowel sounds, no guarding or tenderness -No edema in the lower extremeties -Port-a-cath on left, with hypopigmentation of the skin in the right supra clavicular -No palpable masses in the cervical or supra clavicular region -Skin dry, flaky  Lab Findings: Lab Results  Component Value Date   WBC 7.4 06/15/2015   HGB 9.8* 06/15/2015   HCT 30.6* 06/15/2015   MCV 93.0 06/15/2015   PLT 197 06/15/2015    Radiographic Findings: Dg Chest 2 View  06/14/2015  CLINICAL DATA:  Acute onset of difficulty swallowing and vomiting. Sore throat. Initial encounter. EXAM: CHEST  2 VIEW COMPARISON:  Chest radiograph performed 05/24/2015 FINDINGS: The lungs are well-aerated. Patchy right midlung airspace opacities raise concern for pneumonia. This is new from the recent prior study. There is no evidence of pleural effusion or pneumothorax. The heart is normal in size; the mediastinal contour is within normal limits. A left-sided chest port is noted ending about the mid SVC. No acute osseous abnormalities are seen. IMPRESSION: Patchy right-sided airspace opacities, new from the recent prior study, raise concern for pneumonia. Electronically Signed   By: Garald Balding M.D.   On: 06/14/2015 18:32   Ct Head Wo Contrast  06/14/2015  CLINICAL DATA:  Initial evaluation for acute difficulty swelling. EXAM: CT HEAD WITHOUT CONTRAST TECHNIQUE: Contiguous axial images were obtained from the base of the skull through the vertex without intravenous contrast. COMPARISON:  Prior studies from 06/14/2015.  FINDINGS: Postoperative changes from prior bilateral craniotomies again seen. 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 Encephalomalacia of within the anterior right temporal pole. Changes are stable. Atrophy with chronic microvascular ischemic disease. Remote lacunar infarct within the left caudate head. Additional remote lacunar infarct within the right basal ganglia. No acute intracranial hemorrhage. No definite acute large vessel territory infarct, although evaluation somewhat limited by streak artifact from aneurysm clips. No mass lesion, midline shift, or mass effect. No hydrocephalus. No extra-axial fluid collection. Scalp soft tissues within normal limits. No acute abnormality about the orbits. Paranasal sinuses are clear. Remote defect at the right lamina papyracea noted. No mastoid effusion. No acute abnormality about the calvarium.  Post craniotomy changes. IMPRESSION: 1. No acute intracranial process. 2. Stable postoperative changes, generalized cerebral atrophy, and chronic small vessel ischemic disease. 3. 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 Electronically Signed   By: Jeannine Boga M.D.   On: 06/14/2015 21:12   Ct Soft Tissue Neck W Contrast  06/14/2015  CLINICAL DATA:  60 year old female with history of lung cancer. Difficulty swallowing and sore throat for  the past week and a half. Subsequent encounter. EXAM: CT NECK WITH CONTRAST TECHNIQUE: Multidetector CT imaging of the neck was performed using the standard protocol following the bolus administration of intravenous contrast. CONTRAST:  22m ISOVUE-300 IOPAMIDOL (ISOVUE-300) INJECTION 61% COMPARISON:  06/14/2015 chest x-ray. CT of the chest performed same date is dictated separately. FINDINGS: Pharynx and larynx: Symmetric mild prominence of palatine tonsils may reflect result of mild inflammation. No deep drainable abscess noted. Salivary glands: No mass or inflammation noted. Thyroid: No  mass identified. Lymph nodes: Scattered normal to top-normal size lymph nodes. Vascular: Bilateral carotid bifurcation calcifications with hemodynamically significant stenosis suspected but incompletely assessed. Limited intracranial: Negative. Visualized orbits: Not imaged. Mastoids and visualized paranasal sinuses: Portions visualized clear. Skeleton: No osseous destructive lesion. Mild cervical spondylotic changes. Upper chest: No apical mass. Please see chest CT report dictated separately. IMPRESSION: Symmetric mild prominence of palatine tonsils may reflect result of mild inflammation. Bilateral carotid bifurcation calcifications with hemodynamically significant stenosis suspected but incompletely assessed. Chest CT report dictated separately. Electronically Signed   By: SGenia DelM.D.   On: 06/14/2015 20:07   Ct Chest Wo Contrast  06/14/2015  CLINICAL DATA:  Abnormal chest radiograph. Assess pneumonia. Personal history of lung cancer. Initial encounter. EXAM: CT CHEST WITHOUT CONTRAST TECHNIQUE: Multidetector CT imaging of the chest was performed following the standard protocol without IV contrast. COMPARISON:  CTA of the chest performed 05/03/2015, and chest radiograph performed earlier today at 6:23 p.m. FINDINGS: Mild focal opacities along the right minor fissure and at the right upper lobe are similar in appearance to prior CTA, though less hazy, but are significantly more prominent on chest radiograph. This may be projectional on chest radiograph, or may reflect interval redistribution of atypical infection superimposed on chronic scarring. The left lung appears relatively clear. No pleural effusion or pneumothorax is seen. No dominant mass is seen to suggest recurrent malignancy. Diffuse coronary artery calcifications are seen. Trace pericardial fluid remains within normal limits. No mediastinal lymphadenopathy is seen. No pericardial effusion is identified. Scattered calcification is noted along  the aortic arch and proximal great vessels. A left-sided chest port is noted ending about the mid SVC. The visualized portions of the thyroid gland are unremarkable. No axillary lymphadenopathy is seen. The visualized portions of the liver are unremarkable. The spleen is diminutive. The visualized portions of the gallbladder, pancreas, adrenal glands and kidneys are within normal limits. No acute osseous abnormalities are identified. 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 IMPRESSION: 1. Small focal opacities along the right minor fissure and at the right upper lobe are similar in appearance to the prior CTA, though less hazy. However, these are significantly more prominent on the corresponding chest radiograph. This may be projectional on chest radiograph, or may reflect interval redistribution of atypical infection, superimposed on chronic scarring. 2. No definite evidence for recurrence of malignancy. 3. Diffuse coronary artery calcifications seen. 4. 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 Electronically Signed   By: JGarald BaldingM.D.   On: 06/14/2015 20:01    Impression/Plan:  She is 117 lbs today and her weight has fluctuated between 140 lbs and 113 lbs over the past year. CT of the neck on 06/14/15 showed symmetric mild prominence of palatine tonsils possibly consistent with inflammation. CT of chest w/o contrast on same date showed no definite evidence of recurrence or malignancy.There are small focal opacities in the right lung and changes consistent with Paget's disease  in the spine. CT of head w/o contrast showed no acute process. Her last appointment with MeganGorsuch was in April. She has plans to follow up again with imaging to see Megan. Alvy Bimler in July.   >Referral for nutritionist at [facility] for persistent weight loss. I will also make a referral for PT at [facility] to address her deconditioning and weakness.  > She requests a female dermatologist in  [place] for her skin rash - second opinion.  Hx of Kathreen Cosier dx, poorly alleviated  >For odynophagia, dysphagia, continue with barium  swallow as scheduled and follow up with MeganWolicki. She is following with GI.   I reviewed her plan.  Her upper esophagus received a modest dose of RT and mid esophagus received a higher dose of RT.  Typically, this doesn't cause dysphagia later on, but a stricture is possible/  Strictures not noted in GI  procedure note which instead implies that a more acute process (ie trauma from fish bone) was responsible. I advised her to discuss feeding tube placement with GI next week, depending on how she is doing with intake, then. Go to ED if condition worsens.  > Continue surveillance with Megan Alvy Bimler.  Referral to survivorship sometime in the fall.  I will see her back PRN.    _____________________________________   Eppie Gibson, MD  This document serves as a record of services personally performed by Eppie Gibson, MD. It was created on her behalf by Derek Mound, a trained medical scribe. The creation of this record is based on the scribe's personal observations and the provider's statements to them. This document has been checked and approved by the attending provider.

## 2015-06-25 ENCOUNTER — Ambulatory Visit (HOSPITAL_BASED_OUTPATIENT_CLINIC_OR_DEPARTMENT_OTHER): Payer: Medicaid Other

## 2015-06-25 ENCOUNTER — Encounter: Payer: Self-pay | Admitting: Radiation Oncology

## 2015-06-25 ENCOUNTER — Ambulatory Visit
Admission: RE | Admit: 2015-06-25 | Discharge: 2015-06-25 | Disposition: A | Discharge status: Home / Self Care | Payer: Medicaid Other | Source: Ambulatory Visit | Attending: Radiation Oncology | Admitting: Radiation Oncology

## 2015-06-25 VITALS — BP 134/84 | HR 104 | Temp 97.8°F | Ht 61.0 in | Wt 117.4 lb

## 2015-06-25 DIAGNOSIS — C3492 Malignant neoplasm of unspecified part of left bronchus or lung: Secondary | ICD-10-CM | POA: Diagnosis not present

## 2015-06-25 DIAGNOSIS — C3491 Malignant neoplasm of unspecified part of right bronchus or lung: Secondary | ICD-10-CM | POA: Diagnosis not present

## 2015-06-25 DIAGNOSIS — L511 Stevens-Johnson syndrome: Secondary | ICD-10-CM

## 2015-06-25 DIAGNOSIS — K2950 Unspecified chronic gastritis without bleeding: Secondary | ICD-10-CM | POA: Insufficient documentation

## 2015-06-25 DIAGNOSIS — K2980 Duodenitis without bleeding: Secondary | ICD-10-CM | POA: Diagnosis not present

## 2015-06-25 DIAGNOSIS — R634 Abnormal weight loss: Secondary | ICD-10-CM | POA: Diagnosis not present

## 2015-06-25 DIAGNOSIS — Z452 Encounter for adjustment and management of vascular access device: Secondary | ICD-10-CM

## 2015-06-25 DIAGNOSIS — C3411 Malignant neoplasm of upper lobe, right bronchus or lung: Secondary | ICD-10-CM | POA: Diagnosis present

## 2015-06-25 DIAGNOSIS — R1310 Dysphagia, unspecified: Secondary | ICD-10-CM | POA: Diagnosis not present

## 2015-06-25 MED ORDER — HEPARIN SOD (PORK) LOCK FLUSH 100 UNIT/ML IV SOLN
500.0000 [IU] | Freq: Once | INTRAVENOUS | Status: AC | PRN
  Administered 2015-06-25: 500 [IU] via INTRAVENOUS
  Filled 2015-06-25: qty 5

## 2015-06-25 MED ORDER — SODIUM CHLORIDE 0.9 % IJ SOLN
10.0000 mL | INTRAMUSCULAR | Status: DC | PRN
  Administered 2015-06-25: 10 mL via INTRAVENOUS
  Filled 2015-06-25: qty 10

## 2015-06-25 NOTE — Addendum Note (Signed)
Encounter addended by: Ernst Spell, RN on: 06/25/2015  2:06 PM<BR>     Documentation filed: Charges VN

## 2015-06-25 NOTE — Progress Notes (Signed)
Ms. Horan is here for follow up of radiation completed 11/25/2014 to her Chest/ Right Supraclavicular area. She reports having trouble swallowing. She says "food just won't go down", but she is not having pain. She can only eat applesauce, and drinks about half an Ensure a day. She has visited Dr. Erik Obey early this week, and he plans to perform a Modified Barium Swallow 06/28/15. She is not eating well and has been losing weight. She does report itching all over her body which may be related to [employer] and a rash is present. She denies any shortness of breath. She does report that she becomes tired very easily and relates this to decreased nutritional intake. She has recently been admitted to the hospital for dehydration and pneumonia.   BP 134/84 mmHg  Pulse 104  Temp(Src) 97.8 F (36.6 C)  Ht '5\' 1"'$  (1.549 m)  Wt 117 lb 6.4 oz (53.252 kg)  BMI 22.19 kg/m2  SpO2 100%   Wt Readings from Last 3 Encounters:  06/25/15 117 lb 6.4 oz (53.252 kg)  06/15/15 113 lb 12.8 oz (51.619 kg)  05/25/15 121 lb 8 oz (55.112 kg)

## 2015-06-25 NOTE — Patient Instructions (Signed)

## 2015-06-28 ENCOUNTER — Other Ambulatory Visit: Payer: Medicaid Other

## 2015-06-30 ENCOUNTER — Ambulatory Visit
Admission: RE | Admit: 2015-06-30 | Discharge: 2015-06-30 | Disposition: A | Discharge status: Home / Self Care | Payer: Medicaid Other | Source: Ambulatory Visit | Attending: Otolaryngology | Admitting: Otolaryngology

## 2015-06-30 ENCOUNTER — Telehealth: Payer: Self-pay | Admitting: *Deleted

## 2015-06-30 DIAGNOSIS — R1310 Dysphagia, unspecified: Secondary | ICD-10-CM

## 2015-06-30 DIAGNOSIS — R1319 Other dysphagia: Secondary | ICD-10-CM

## 2015-06-30 NOTE — Telephone Encounter (Signed)
Called patient's son Yetta Glassman to inform of PT appt. On 07-06-15- arrival time - 1:30 pm @ [facility], spoke to  Mr. Matthew Saras and he is aware of this appt.

## 2015-07-01 ENCOUNTER — Telehealth: Payer: Self-pay | Admitting: Hematology and Oncology

## 2015-07-01 NOTE — Telephone Encounter (Signed)
pt called to r/s appt...done....pt ok and aware of new d.t °

## 2015-07-02 ENCOUNTER — Encounter: Payer: Medicaid Other | Admitting: Nutrition

## 2015-07-02 ENCOUNTER — Encounter (HOSPITAL_COMMUNITY): Payer: Self-pay

## 2015-07-02 ENCOUNTER — Inpatient Hospital Stay (HOSPITAL_COMMUNITY)
Admission: AD | Admit: 2015-07-02 | Discharge: 2015-07-08 | DRG: 392 | Disposition: A | Discharge status: Home Health | Payer: Medicaid Other | Source: Ambulatory Visit | Attending: Internal Medicine | Admitting: Internal Medicine

## 2015-07-02 ENCOUNTER — Ambulatory Visit (INDEPENDENT_AMBULATORY_CARE_PROVIDER_SITE_OTHER): Payer: Medicaid Other | Admitting: Gastroenterology

## 2015-07-02 ENCOUNTER — Encounter: Payer: Self-pay | Admitting: Gastroenterology

## 2015-07-02 VITALS — BP 153/92 | HR 129 | Temp 97.4°F | Ht 61.0 in | Wt 115.2 lb

## 2015-07-02 DIAGNOSIS — K2980 Duodenitis without bleeding: Secondary | ICD-10-CM | POA: Diagnosis present

## 2015-07-02 DIAGNOSIS — Z8249 Family history of ischemic heart disease and other diseases of the circulatory system: Secondary | ICD-10-CM

## 2015-07-02 DIAGNOSIS — R1312 Dysphagia, oropharyngeal phase: Principal | ICD-10-CM | POA: Diagnosis present

## 2015-07-02 DIAGNOSIS — Z9221 Personal history of antineoplastic chemotherapy: Secondary | ICD-10-CM

## 2015-07-02 DIAGNOSIS — I10 Essential (primary) hypertension: Secondary | ICD-10-CM | POA: Diagnosis present

## 2015-07-02 DIAGNOSIS — K222 Esophageal obstruction: Secondary | ICD-10-CM | POA: Diagnosis present

## 2015-07-02 DIAGNOSIS — R1310 Dysphagia, unspecified: Secondary | ICD-10-CM

## 2015-07-02 DIAGNOSIS — Z808 Family history of malignant neoplasm of other organs or systems: Secondary | ICD-10-CM

## 2015-07-02 DIAGNOSIS — Z85118 Personal history of other malignant neoplasm of bronchus and lung: Secondary | ICD-10-CM

## 2015-07-02 DIAGNOSIS — R627 Adult failure to thrive: Secondary | ICD-10-CM | POA: Diagnosis present

## 2015-07-02 DIAGNOSIS — R609 Edema, unspecified: Secondary | ICD-10-CM | POA: Diagnosis present

## 2015-07-02 DIAGNOSIS — B3781 Candidal esophagitis: Secondary | ICD-10-CM | POA: Diagnosis not present

## 2015-07-02 DIAGNOSIS — E860 Dehydration: Secondary | ICD-10-CM | POA: Diagnosis not present

## 2015-07-02 DIAGNOSIS — Z87891 Personal history of nicotine dependence: Secondary | ICD-10-CM

## 2015-07-02 DIAGNOSIS — Z923 Personal history of irradiation: Secondary | ICD-10-CM

## 2015-07-02 DIAGNOSIS — C3491 Malignant neoplasm of unspecified part of right bronchus or lung: Secondary | ICD-10-CM

## 2015-07-02 DIAGNOSIS — K2970 Gastritis, unspecified, without bleeding: Secondary | ICD-10-CM | POA: Diagnosis present

## 2015-07-02 DIAGNOSIS — D649 Anemia, unspecified: Secondary | ICD-10-CM | POA: Diagnosis present

## 2015-07-02 DIAGNOSIS — K269 Duodenal ulcer, unspecified as acute or chronic, without hemorrhage or perforation: Secondary | ICD-10-CM | POA: Diagnosis present

## 2015-07-02 DIAGNOSIS — Z8711 Personal history of peptic ulcer disease: Secondary | ICD-10-CM

## 2015-07-02 DIAGNOSIS — Z833 Family history of diabetes mellitus: Secondary | ICD-10-CM

## 2015-07-02 DIAGNOSIS — C3492 Malignant neoplasm of unspecified part of left bronchus or lung: Secondary | ICD-10-CM

## 2015-07-02 LAB — CBC
HEMATOCRIT: 31.3 % — AB (ref 36.0–46.0)
Hemoglobin: 10.4 g/dL — ABNORMAL LOW (ref 12.0–15.0)
MCH: 30.6 pg (ref 26.0–34.0)
MCHC: 33.2 g/dL (ref 30.0–36.0)
MCV: 92.1 fL (ref 78.0–100.0)
PLATELETS: 242 10*3/uL (ref 150–400)
RBC: 3.4 MIL/uL — ABNORMAL LOW (ref 3.87–5.11)
RDW: 18.8 % — AB (ref 11.5–15.5)
WBC: 10.3 10*3/uL (ref 4.0–10.5)

## 2015-07-02 LAB — BASIC METABOLIC PANEL
ANION GAP: 8 (ref 5–15)
BUN: 15 mg/dL (ref 6–20)
CALCIUM: 8.5 mg/dL — AB (ref 8.9–10.3)
CO2: 27 mmol/L (ref 22–32)
CREATININE: 0.97 mg/dL (ref 0.44–1.00)
Chloride: 104 mmol/L (ref 101–111)
GLUCOSE: 82 mg/dL (ref 65–99)
Potassium: 3.7 mmol/L (ref 3.5–5.1)
Sodium: 139 mmol/L (ref 135–145)

## 2015-07-02 MED ORDER — ZOLPIDEM TARTRATE 5 MG PO TABS
5.0000 mg | ORAL_TABLET | Freq: Every evening | ORAL | Status: DC | PRN
  Administered 2015-07-02 – 2015-07-07 (×4): 5 mg via ORAL
  Filled 2015-07-02 (×4): qty 1

## 2015-07-02 MED ORDER — SODIUM CHLORIDE 0.9 % IV SOLN
INTRAVENOUS | Status: DC
  Administered 2015-07-02 – 2015-07-07 (×8): via INTRAVENOUS

## 2015-07-02 MED ORDER — ACETAMINOPHEN 325 MG PO TABS
650.0000 mg | ORAL_TABLET | Freq: Four times a day (QID) | ORAL | Status: DC | PRN
  Administered 2015-07-02: 650 mg via ORAL
  Filled 2015-07-02 (×3): qty 2

## 2015-07-02 MED ORDER — ACETAMINOPHEN 650 MG RE SUPP: 650.0000 mg | Freq: Four times a day (QID) | RECTAL | Status: DC | PRN

## 2015-07-02 MED ORDER — RESOURCE THICKENUP CLEAR PO POWD
ORAL | Status: DC | PRN
  Filled 2015-07-02: qty 125

## 2015-07-02 MED ORDER — SUCRALFATE 1 GM/10ML PO SUSP
1.0000 g | Freq: Three times a day (TID) | ORAL | Status: DC
  Administered 2015-07-02 – 2015-07-05 (×12): 1 g via ORAL
  Filled 2015-07-02 (×12): qty 10

## 2015-07-02 NOTE — Evaluation (Signed)
Clinical/Bedside Swallow Evaluation Patient Details  Name: Megan Sims MRN: [id number] Date of Birth: October 16, 1955  Today's Date: 07/02/2015 Time: SLP Start Time (ACUTE ONLY): 1536 SLP Stop Time (ACUTE ONLY): 1648 SLP Time Calculation (min) (ACUTE ONLY): 72 min  Past Medical History:  Past Medical History  Diagnosis Date  . Migraine   . Lung abnormality   . Hypertension   . Pleurisy   . Cerebral aneurysm 2 brain surgeries 96 or 97  . Paget disease of bone   . Diarrhea   . Renal insufficiency     Patient states " no kidney problems  . S/P biopsy     of throat per patient.  . Oral thrush 09/24/2014  . Nicotine dependence 09/24/2014  . Chronic neck pain 09/24/2014  . Skin ulcer ([place]) 11/18/2014  . Emphysema of lung ([place]) 05/05/2015  . Malignant neoplasm of unknown origin ([place])   . Cancer [facility])     malignant neoplasm of unknown origin   Past Surgical History:  Past Surgical History  Procedure Laterality Date  . Chest tube insertion    . Tubal ligation    . Cerebral aneurysm repair  96 or 97  . Colonoscopy      10 years ago in [place]   . Esophagogastroduodenoscopy  06/16/2015    Dr. Gala Romney: edentulous cricopharyngeus, esophageal plaques, biopsy consistent with candida  . Laryngoscopy  Jun 22, 2015    [facility], Dr. Erik Obey: normal oropharynx, no lesions, mobile vocal cords with good airway.   HPI:  Pt is a 60 year old female with past medical history of bilateral lung cancer with chronic dysphagia presented from with complaints of worsening dysphagia with weight loss and poor oral intake. She has been referred by outpatient gastroenterologist for admission for dehydration, failure to thrive secondary to ongoing dysphagia. Note barium swallow 06-30-15, with frank aspiration of thin liquids with cough response.    Assessment / Plan / Recommendation Clinical Impression  Pt presents with severe pharyngoesophageal dysphagia of unknown etiology. Extensive PO trials observed with pt who  displayed consistent overt signs and symptoms of poor airway protection with all consistencies: ice chips, thin, nectar thick, and honey thick consistencies as well as puree and solid PO. Symptoms characterized by wet vocal quality, throat clearing, coughing, and expectoration of boluses orally. However pt displayed SIGNIFICANT IMPROVEMENT in swallow function with use of CHIN TUCK STRATEGY. Vocal quality was clear with use of CHIN TUCK across all consistencies, though pt requires additional swallows per each bite and sip due to known residuals from prior objective swallow studies and delayed wet vocal quality without multiple reflexive swallow and chin in tucked position. Pt able to implement safe swallow strategy independently. Recommend dysphagia 2 (chopped) and nectar thick liquids at meals with CHIN TUCK strategy with Lorenza Chick Protocol: water in between meals following oral care as aspiration risk still remains high. ST to closely monitor for diet tolerance. Recommend medicines crushed in puree.        Aspiration Risk  Moderate aspiration risk    Diet Recommendation   Dysphagia 2 (chopped) , Nectar Thick Liquids; [employer] Protocol: water following oral care, in between meals  Medication Administration: Crushed with puree    Other  Recommendations Oral Care Recommendations: Oral care BID;Oral care prior to ice chip/H20 Other Recommendations: Order thickener from pharmacy   Follow up Recommendations  Outpatient SLP    Frequency and Duration min 2x/week  1 week       Prognosis Prognosis for Safe  Diet Advancement: Fair Barriers to Reach Goals: Severity of deficits      Swallow Study   General Date of Onset: 07/02/15 HPI: Pt is a 60 year old female with past medical history of bilateral lung cancer with chronic dysphagia presented from with complaints of worsening dysphagia with weight loss and poor oral intake. She has been referred by outpatient gastroenterologist for admission  for dehydration, failure to thrive secondary to ongoing dysphagia. Note barium swallow 06-30-15, with frank aspiration of thin liquids with cough response.  Type of Study: Bedside Swallow Evaluation Previous Swallow Assessment: August 2016 MBSS at [facility] D2/HTL Diet Prior to this Study: NPO Temperature Spikes Noted: No Respiratory Status: Room air History of Recent Intubation: No Behavior/Cognition: Alert;Cooperative;Pleasant mood Oral Cavity Assessment: Within Functional Limits Oral Cavity - Dentition: Adequate natural dentition Vision: Functional for self-feeding Self-Feeding Abilities: Able to feed self Patient Positioning: Upright in bed Baseline Vocal Quality: Normal Volitional Cough: Wet Volitional Swallow: Able to elicit    Oral/Motor/Sensory Function Overall Oral Motor/Sensory Function: Within functional limits   Ice Chips Ice chips: Impaired Presentation: Spoon Pharyngeal Phase Impairments: Wet Vocal Quality;Multiple swallows;Throat Clearing - Immediate;Cough - Immediate;Cough - Delayed;Suspected delayed Swallow;Decreased hyoid-laryngeal movement   Thin Liquid Thin Liquid: Impaired Presentation: Spoon Pharyngeal  Phase Impairments: Multiple swallows;Wet Vocal Quality;Suspected delayed Swallow;Decreased hyoid-laryngeal movement;Throat Clearing - Immediate;Throat Clearing - Delayed;Cough - Delayed;Cough - Immediate    Nectar Thick Nectar Thick Liquid: Impaired Pharyngeal Phase Impairments: Multiple swallows;Wet Vocal Quality;Throat Clearing - Immediate;Suspected delayed Swallow;Throat Clearing - Delayed;Cough - Immediate;Decreased hyoid-laryngeal movement   Honey Thick Honey Thick Liquid: Impaired Pharyngeal Phase Impairments: Suspected delayed Swallow;Wet Vocal Quality;Multiple swallows;Throat Clearing - Immediate;Throat Clearing - Delayed;Decreased hyoid-laryngeal movement;Cough - Immediate;Cough - Delayed   Puree Puree: Impaired Presentation: Spoon Pharyngeal Phase Impairments:  Suspected delayed Swallow;Wet Vocal Quality;Multiple swallows;Throat Clearing - Immediate;Throat Clearing - Delayed;Cough - Immediate;Cough - Delayed   Solid   GO   Solid: Impaired Pharyngeal Phase Impairments: Multiple swallows;Wet Vocal Quality;Suspected delayed Swallow;Throat Clearing - Immediate;Throat Clearing - Delayed;Cough - Immediate;Cough - Delayed    Functional Assessment Tool Used: clinical judgment Functional Limitations: Swallowing Swallow Current Status (I1443): At least 60 percent but less than 80 percent impaired, limited or restricted Swallow Goal Status [phone]): At least 40 percent but less than 60 percent impaired, limited or restricted   Arvil Chaco MA, [place] Speech Language Pathologist    Arvil Chaco E 07/02/2015,5:05 PM

## 2015-07-02 NOTE — H&P (Signed)
History and Physical  Megan Sims RCV:[id number] DOB: [birth year] DOA: 07/02/2015  Referring NP: Laban Emperor PCP: Rosita Fire, MD   Chief Complaint: Dysphagia  HPI:  82 yow with past medical history of bilateral lung cancer with chronic dysphagia presented from with complaints of worsening dysphagia with weight loss and poor oral intake. She has been referred by outpatient gastroenterologist for admission for dehydration, failure to thrive secondary to ongoing dysphagia.  Completed chemoradiation for bilateral lung cancer including radiation to the neck in November 2016. Review of notes notes chronic dysphagia from 2016.   She reports eating on 5/14 and throat was sore the next day. Pain and ability to drink progressively worsened. Moderate cough with drinking. Able to eat a soft diet at times. Decreased PO intake and inability to swallow medications due to difficulty swallowing. Ongoing weight loss. Recently seen by GI with notation of candida esophagitis. Barium esophagram showed frank aspiration earlier this month. Noted to have outpatient visit with ENT  (Dr. Erik Obey) on 01/25/56 and had a flexible laryngoscopy done while in office, showing normal oropharynx, no lesions, mobile vocal cords with good airway. Also noted to have outpatient speech pathology evaluation on Jun 15, 2015  recommended dysphagia 2 diet with added gravies and honey-thick liquids during meals, with thin water only after oral care and between meals, no other thin liquids.   Review of Systems:  Positive for dysphagia Negative for fever, visual changes, sore throat, rash, new muscle aches, chest pain, SOB, dysuria, bleeding, n/v/abdominal pain.  Past Medical History  Diagnosis Date  . Migraine   . Lung abnormality   . Hypertension   . Pleurisy   . Cerebral aneurysm 2 brain surgeries 96 or 97  . Paget disease of bone   . Diarrhea   . Renal insufficiency     Patient states " no kidney problems  . S/P biopsy     of  throat per patient.  . Oral thrush 09/24/2014  . Nicotine dependence 09/24/2014  . Chronic neck pain 09/24/2014  . Skin ulcer ([place]) 11/18/2014  . Emphysema of lung ([place]) 05/05/2015  . Malignant neoplasm of unknown origin ([place])   . Cancer [facility])     malignant neoplasm of unknown origin    Past Surgical History  Procedure Laterality Date  . Chest tube insertion    . Tubal ligation    . Cerebral aneurysm repair  96 or 97  . Colonoscopy      10 years ago in [place]   . Esophagogastroduodenoscopy  06/16/2015    Dr. Gala Romney: edentulous cricopharyngeus, esophageal plaques, biopsy consistent with candida  . Laryngoscopy  Jun 22, 2015    [facility], Dr. Erik Obey: normal oropharynx, no lesions, mobile vocal cords with good airway.    Social History:  reports that she has quit smoking. Her smoking use included Cigarettes. She has a 17 pack-year smoking history. She has never used smokeless tobacco. She reports that she does not drink alcohol or use illicit drugs.   No Known Allergies  Family History  Problem Relation Age of Onset  . Hypertension    . Diabetes    . Kidney disease    . Cancer Mother     throat ca  . Cancer Maternal Grandmother     thyroid ca  . Colon cancer Neg Hx      Prior to Admission medications   Medication Sig Start Date End Date Taking? Authorizing Provider  amLODipine (NORVASC) 10 MG tablet Take 1 tablet (10 mg total)  by mouth daily. 11/11/14   Heath Lark, MD  chlorpheniramine-HYDROcodone (TUSSIONEX) 10-8 MG/5ML SUER Take 5 mLs by mouth every 12 (twelve) hours as needed for cough.  05/05/15   Historical Provider, MD  fluconazole (DIFLUCAN) 200 MG tablet Take two ('400mg'$ ) tabs on day 1, then continue one tab ('200mg'$ ) for 20 days. Patient not taking: Reported on 07/02/2015 06/17/15   Mahala Menghini, PA-C  hydrOXYzine (ATARAX/VISTARIL) 25 MG tablet Take 1 tablet (25 mg total) by mouth 3 (three) times daily. Patient not taking: Reported on 07/02/2015 06/01/15   Rosita Fire, MD    sucralfate (CARAFATE) 1 GM/10ML suspension Take 10 mLs (1 g total) by mouth 4 (four) times daily -  with meals and at bedtime. 06/17/15   Rosita Fire, MD  zolpidem (AMBIEN) 10 MG tablet Take 1 tablet (10 mg total) by mouth at bedtime as needed for sleep. 06/01/15   Rosita Fire, MD   Physical Exam: Filed Vitals:   07/02/15 1050  BP: 153/88  Pulse: 114  Temp: 97.8 F (36.6 C)  TempSrc: Oral  Resp: 18  Height: '5\' 1"'$  (1.549 m)  Weight: 52.481 kg (115 lb 11.2 oz)  SpO2: 100%    Constitutional:  . Appears calm and comfortable Eyes:  . PERRL and irises appear normal . Normal conjunctivae and lids ENMT:  . external ears, nose appear normal . grossly normal hearing . Lips appear normal;   Neck:  . neck appears normal, no masses  . no thyromegaly . Scarring and thickening of skin anterior chest Respiratory:  . CTA bilaterally, no w/r/r.  . Respiratory effort normal. No retractions or accessory muscle use Cardiovascular:  . RRR, no m/r/g . No LE extremity edema   Abdomen:  . Abdomen appears normal; no tenderness or masses . No hernias Musculoskeletal:  . RUE, LUE, RLE, LLE   o Generally weak.  o No tenderness, masses Neurologic:  . Grossly normal Psychiatric:  . judgement and insight appear normal . Mental status o Mood and affect. Normal  Wt Readings from Last 3 Encounters:  07/02/15 52.481 kg (115 lb 11.2 oz)  07/02/15 52.254 kg (115 lb 3.2 oz)  06/25/15 53.252 kg (117 lb 6.4 oz)      Principal Problem:   Dehydration Active Problems:   Dysphagia   Bilateral lung cancer (HCC)   Candida esophagitis (HCC)   Assessment/Plan 1. Acute on chronic dysphagia, long-standing with significant worsening, associated weight loss, with barium swallow 6/7 demonstrating frank aspiration. Ill inability to tolerate medications or take liquids. 2. Dehydration secondary to poor oral intake secondary to dysphagia. 3. 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 4. Bilateral lung cancer finished chemoradiation 11/2014    Plan observation, IV fluids. Check screening labs including CBC and basic metabolic panel.  Speech therapy consultation.  Depending on recommendations and progress, may be able to go home next 24-48 hours and pursue modified barium swallow as an outpatient.   Code Status: Full  DVT prophylaxis SCDs Family Communication: Son, Montine Circle, bedside.  Disposition Plan/Anticipated LOS: Observation. 1-2 days.  Time spent: 55 minutes  Murray Hodgkins, MD  [facility] Direct contact:  --Via amion app OR  --[email or web address]; password [email or web address] and click  [id number] contact night coverage as above  07/02/2015, 12:32 PM     By signing my name below, I, Rennis Harding attest that this documentation has been prepared under the direction and in the presence of Murray Hodgkins, MD Electronically signed: Rennis Harding  I personally performed the services described in this documentation. All medical record entries made by the scribe were at my direction. I have reviewed the chart and agree that the record reflects my personal performance and is accurate and complete. Murray Hodgkins, MD

## 2015-07-02 NOTE — Progress Notes (Signed)
Patient and family state that her ENT doctor, Dr. Erik Obey, of [facility], wishes to be informed of the patient's medical care/records while she is admitted at [facility].  Dr. Noreene Filbert office number is [phone].  The office is open Monday-Friday.

## 2015-07-02 NOTE — Progress Notes (Signed)
Appreciate hospitalist help with inpatient care. Relatively acute onset dysphagia shortly after finishing XRT and chemotherapy for lung cancer. Recent EGD demonstrated a edematous cricopharyngeus and Candida esophagitis. There was a question as to possible coincidental fishbone impaction at the onset of her symptoms. She's been treated for candidal esophagitis. She has seen Dr. Erik Obey.  I suspect she may have some radiation injury to her cricopharyngeal area. Agree with a speech evaluation. She may ultimately benefit from a PEG placement.  Dr. Laural Golden will be looking in on her over the weekend.

## 2015-07-02 NOTE — Assessment & Plan Note (Signed)
60 year old female with history of bilateral lung cancer (August/September 2016), finishing chemoradiation in November 2016, presenting with worsening dysphagia and inability to tolerate liquids or oral medications. Interestingly, soft foods are able to occasionally be tolerated, but clinically she appears weak, is tachycardic, and has been unable to take her medications as prescribed. Frank aspiration noted on outpatient barium swallow 2 days ago. She has been seen by both ENT and Radiation Oncology within the past week, with both raising the consideration for a PEG tube if persistent difficulties with po intake. From a GI standpoint, she has undergone an EGD as of Jun 16, 2015, with findings of candida esophagitis, gastric and duodenal erosions, and edematous cricopharyngeus.   I feel she needs admission for observation, labs, fluid resuscitation. Discussed briefly with Genene Churn, Speech Pathologist, who recommends an MBSS. This could be arranged for Monday. If she is unsafe to take oral nutrition, PEG tube placement would be the next step. I discussed with patient possible outpatient management until Monday, but she appears weak and is not able to take in adequate fluids to remain hydrated, nor is she able to tolerate her medications.   Anemia: known from prior admission and multifactorial. No overt GI bleeding.   Discussed with Dr. Sarajane Jews: admit for observation. Will follow along with you and order MBSS for Monday, which will likely need to be done in [place].

## 2015-07-02 NOTE — Progress Notes (Signed)
Primary Care Physician:  Rosita Fire, MD Primary Gastroenterologist:  Dr. Oneida Alar   Chief Complaint  Patient presents with  . Dysphagia    HPI:   Megan Sims is a 60 y.o. female presenting today with dysphagia, inability to tolerate liquids. She has a history of bilateral lung cancer stage IIIB, diagnosed in Aug/Sept 2016 and completing chemoradiation Nov 2016. Followed by Dr. Alvy Bimler with oncology and last seen by Radiation Oncology (Dr. Isidore Moos) on 06/25/15. She was admitted late May 2017 secondary to recurrent solid food dysphagia. Endoscopy completed with edematous cricopharyngeus, candida esophagitis. Speech Pathology evaluated patient Jun 15, 2015 while inpatient and recommended dysphagia 2 diet with added gravies and honey-thick liquids during meals, with thin water only after oral care and between meals, no other thin liquids. Felt to have element of dysphagia from previous cancer. MBSS not performed as radiology closure for several months.   She saw ENT at [facility] (Dr. Erik Obey) on 1/51/76 and had a flexible laryngoscopy done while in office, showing normal oropharynx, no lesions, mobile vocal cords with good airway. Due to her symptoms of dysphagia, a barium swallow was ordered. G tube consideration was raised. She then saw Dr. Isidore Moos on 06/25/15 and her note reports discussion of feeding tube should be undertaken with GI. Barium swallow ordered by ENT on 6/7 noted frank aspiration and cough response. Remainder of esophagus could not be evaluated. I discussed this case briefly today with Genene Churn, Speech Pathologist, who recommended a MBSS for further evaluation prior to PEG tube decision.   Patient presented today stating she had done fairly well with soft foods at discharge (discharged 06/17/15) but has had worsening of dysphagia since this time. She reports "hit or miss" ability to tolerate soft foods such as mashed potatoes. She is only able to tolerate small amounts at a time, if  any. She has tried to crush her pills and take with water or applesauce, but she has had to stretch this over an entire day due to difficulty swallowing. She drinks a sip of fluid and has to "spit it back up". She tells me she feels as if she can't swallow her saliva.   Can't take her BP medication. Spits the rest of it back up. Some mornings will wake up and can swallow, some days not. States sometimes can't swallow in the morning because it is dry. Sleeps better on her back than her side. Yesterday morning couldn't eat till 12 or 1pm. This morning just had a swallow of water. Urine is dark. Lips feel dry. States she has sores on her lips. Reports intermittent periorbital swelling, chronic.   Past Medical History  Diagnosis Date  . Migraine   . Lung abnormality   . Hypertension   . Pleurisy   . Cerebral aneurysm 2 brain surgeries 96 or 97  . Paget disease of bone   . Diarrhea   . Renal insufficiency     Patient states " no kidney problems  . S/P biopsy     of throat per patient.  . Oral thrush 09/24/2014  . Nicotine dependence 09/24/2014  . Chronic neck pain 09/24/2014  . Skin ulcer ([place]) 11/18/2014  . Emphysema of lung ([place]) 05/05/2015  . Malignant neoplasm of unknown origin ([place])   . Cancer [facility])     malignant neoplasm of unknown origin    Past Surgical History  Procedure Laterality Date  . Chest tube insertion    . Tubal ligation    .  Cerebral aneurysm repair  96 or 97  . Colonoscopy      10 years ago in [place]   . Esophagogastroduodenoscopy  06/16/2015    Dr. Gala Romney: edentulous cricopharyngeus, esophageal plaques, biopsy consistent with candida  . Laryngoscopy  Jun 22, 2015    [facility], Dr. Erik Obey: normal oropharynx, no lesions, mobile vocal cords with good airway.    Current Outpatient Prescriptions  Medication Sig Dispense Refill  . amLODipine (NORVASC) 10 MG tablet Take 1 tablet (10 mg total) by mouth daily. 30 tablet 6  . chlorpheniramine-HYDROcodone (TUSSIONEX) 10-8  MG/5ML SUER Take 5 mLs by mouth every 12 (twelve) hours as needed for cough.   0  . sucralfate (CARAFATE) 1 GM/10ML suspension Take 10 mLs (1 g total) by mouth 4 (four) times daily -  with meals and at bedtime. 420 mL 0  . zolpidem (AMBIEN) 10 MG tablet Take 1 tablet (10 mg total) by mouth at bedtime as needed for sleep. 30 tablet 0  . fluconazole (DIFLUCAN) 200 MG tablet Take two ('400mg'$ ) tabs on day 1, then continue one tab ('200mg'$ ) for 20 days. (Patient not taking: Reported on 07/02/2015) 22 tablet 0  . hydrOXYzine (ATARAX/VISTARIL) 25 MG tablet Take 1 tablet (25 mg total) by mouth 3 (three) times daily. (Patient not taking: Reported on 07/02/2015) 30 tablet 0   No current facility-administered medications for this visit.    Allergies as of 07/02/2015  . (No Known Allergies)    Family History  Problem Relation Age of Onset  . Hypertension    . Diabetes    . Kidney disease    . Cancer Mother     throat ca  . Cancer Maternal Grandmother     thyroid ca  . Colon cancer Neg Hx     Social History   Social History  . Marital Status: Married    Spouse Name: N/A  . Number of Children: N/A  . Years of Education: N/A   Occupational History  . Not on file.   Social History Main Topics  . Smoking status: Former Smoker -- 0.50 packs/day for 34 years    Types: Cigarettes  . Smokeless tobacco: Never Used     Comment: HOWEVER, will smoke 1 cigarette if "tragic" event (death), or if she needs it for her nerves   . Alcohol Use: No  . Drug Use: No  . Sexual Activity: Not on file   Other Topics Concern  . Not on file   Social History Narrative    Review of Systems: As mentioned in HPI   Physical Exam: BP 153/92 mmHg  Pulse 129  Temp(Src) 97.4 F (36.3 C) (Oral)  Ht '5\' 1"'$  (1.549 m)  Wt 115 lb 3.2 oz (52.254 kg)  BMI 21.78 kg/m2 General:   Alert and oriented. Pleasant and cooperative. Thin. Appears tired, fatigued.  Head:  Normocephalic and atraumatic. Eyes:  Without icterus,  periorbital edema noted, chronic Ears:  Normal auditory acuity. Nose:  No deformity, discharge,  or lesions. Mouth:  Lips appear mildly swollen, unknown baseline for patient, mouth is moist but lips are cracked, dry.  Lungs:  Clear to auscultation bilaterally.  Heart:  S1, S2 present, tachycardic  Abdomen:  +BS, soft, non-tender and non-distended. No HSM noted. No guarding or rebound. No masses appreciated.  Rectal:  Deferred  Msk:  Symmetrical without gross deformities. Slight kyphosis, difficulty getting on exam table and stumbles.  Extremities:  Without edema. Neurologic:  Alert and  oriented x4 Psych:  Alert  and cooperative. Normal mood and affect.  Weights: Jan 2017 120s, today 115.   June 30, 2015  CLINICAL DATA: Dysphagia, weight loss, history of lung carcinoma  EXAM: ESOPHOGRAM/BARIUM SWALLOW  TECHNIQUE: Single contrast examination was performed using thin barium.  FLUOROSCOPY TIME: Radiation Exposure Index (as provided by the fluoroscopic device): 7.5 deciGy per square cm  If the device does not provide the exposure index:  Fluoroscopy Time: 18 seconds of fluoroscopy time  Number of Acquired Images:  COMPARISON: Barium swallow of 09/16/2014  FINDINGS: After reviewing the prior barium swallow from 09/16/2014, the study was begun in the lateral projection. The patient swallowed barium and considerable residual barium is noted within the vallecular and piriform sinuses. The patient then demonstrated frank aspiration with some cough response. In view of this frank aspiration, the remainder of the esophagus could not be evaluated. No gross abnormality is evident on the single swallow obtained.  IMPRESSION: Frank aspiration with cough response.   Electronically Signed  By: Ivar Drape M.D.  On: 06/30/2015 11:36  Assessment/Plan:   60 year old female with history of bilateral lung cancer (August/September 2016), finishing chemoradiation in  November 2016, presenting with worsening dysphagia and inability to tolerate liquids or oral medications. Interestingly, soft foods are able to occasionally be tolerated, but clinically she appears weak, is tachycardic, and has been unable to take her medications as prescribed. Frank aspiration noted on outpatient barium swallow 2 days ago. She has been seen by both ENT and Radiation Oncology within the past week, with both raising the consideration for a PEG tube if persistent difficulties with po intake. From a GI standpoint, she has undergone an EGD as of Jun 16, 2015, with findings of candida esophagitis, gastric and duodenal erosions, and edematous cricopharyngeus.   I feel she needs admission for observation, labs, fluid resuscitation. Discussed briefly with Genene Churn, Speech Pathologist, who recommends an MBSS. This could be arranged for Monday. If she is unsafe to take oral nutrition, PEG tube placement would be the next step. I discussed with patient possible outpatient management until Monday, but she appears weak and is not able to take in adequate fluids to remain hydrated, nor is she able to tolerate her medications.   Anemia: known from prior admission and multifactorial. No overt GI bleeding.   Discussed with Dr. Sarajane Jews: admit for observation. Will follow along with you and order MBSS for Monday, which will likely need to be done in [place].   Orvil Feil, ANP-BC [facility]

## 2015-07-03 DIAGNOSIS — D649 Anemia, unspecified: Secondary | ICD-10-CM | POA: Diagnosis present

## 2015-07-03 DIAGNOSIS — K222 Esophageal obstruction: Secondary | ICD-10-CM | POA: Diagnosis present

## 2015-07-03 DIAGNOSIS — R1312 Dysphagia, oropharyngeal phase: Secondary | ICD-10-CM | POA: Diagnosis present

## 2015-07-03 DIAGNOSIS — Z808 Family history of malignant neoplasm of other organs or systems: Secondary | ICD-10-CM | POA: Diagnosis not present

## 2015-07-03 DIAGNOSIS — K2970 Gastritis, unspecified, without bleeding: Secondary | ICD-10-CM | POA: Diagnosis present

## 2015-07-03 DIAGNOSIS — Z833 Family history of diabetes mellitus: Secondary | ICD-10-CM | POA: Diagnosis not present

## 2015-07-03 DIAGNOSIS — Z87891 Personal history of nicotine dependence: Secondary | ICD-10-CM | POA: Diagnosis not present

## 2015-07-03 DIAGNOSIS — Z8249 Family history of ischemic heart disease and other diseases of the circulatory system: Secondary | ICD-10-CM | POA: Diagnosis not present

## 2015-07-03 DIAGNOSIS — K269 Duodenal ulcer, unspecified as acute or chronic, without hemorrhage or perforation: Secondary | ICD-10-CM | POA: Diagnosis present

## 2015-07-03 DIAGNOSIS — I10 Essential (primary) hypertension: Secondary | ICD-10-CM | POA: Diagnosis present

## 2015-07-03 DIAGNOSIS — C3492 Malignant neoplasm of unspecified part of left bronchus or lung: Secondary | ICD-10-CM

## 2015-07-03 DIAGNOSIS — B3781 Candidal esophagitis: Secondary | ICD-10-CM | POA: Diagnosis present

## 2015-07-03 DIAGNOSIS — Z85118 Personal history of other malignant neoplasm of bronchus and lung: Secondary | ICD-10-CM | POA: Diagnosis not present

## 2015-07-03 DIAGNOSIS — R1310 Dysphagia, unspecified: Secondary | ICD-10-CM | POA: Diagnosis present

## 2015-07-03 DIAGNOSIS — R1314 Dysphagia, pharyngoesophageal phase: Secondary | ICD-10-CM | POA: Diagnosis not present

## 2015-07-03 DIAGNOSIS — R609 Edema, unspecified: Secondary | ICD-10-CM | POA: Diagnosis present

## 2015-07-03 DIAGNOSIS — Z8711 Personal history of peptic ulcer disease: Secondary | ICD-10-CM | POA: Diagnosis not present

## 2015-07-03 DIAGNOSIS — R627 Adult failure to thrive: Secondary | ICD-10-CM | POA: Diagnosis present

## 2015-07-03 DIAGNOSIS — C3491 Malignant neoplasm of unspecified part of right bronchus or lung: Secondary | ICD-10-CM

## 2015-07-03 DIAGNOSIS — Z9221 Personal history of antineoplastic chemotherapy: Secondary | ICD-10-CM | POA: Diagnosis not present

## 2015-07-03 DIAGNOSIS — K2980 Duodenitis without bleeding: Secondary | ICD-10-CM | POA: Diagnosis present

## 2015-07-03 DIAGNOSIS — R634 Abnormal weight loss: Secondary | ICD-10-CM

## 2015-07-03 DIAGNOSIS — E860 Dehydration: Secondary | ICD-10-CM | POA: Diagnosis present

## 2015-07-03 DIAGNOSIS — D6489 Other specified anemias: Secondary | ICD-10-CM | POA: Diagnosis not present

## 2015-07-03 DIAGNOSIS — Z923 Personal history of irradiation: Secondary | ICD-10-CM | POA: Diagnosis not present

## 2015-07-03 MED ORDER — DEXAMETHASONE SODIUM PHOSPHATE 4 MG/ML IJ SOLN
4.0000 mg | Freq: Four times a day (QID) | INTRAMUSCULAR | Status: AC
Start: 2015-07-03 — End: 2015-07-04
  Administered 2015-07-03 – 2015-07-04 (×4): 4 mg via INTRAVENOUS
  Filled 2015-07-03 (×4): qty 1

## 2015-07-03 MED ORDER — TRAMADOL HCL 50 MG PO TABS
50.0000 mg | ORAL_TABLET | Freq: Four times a day (QID) | ORAL | Status: DC | PRN
  Administered 2015-07-03 – 2015-07-08 (×11): 50 mg via ORAL
  Filled 2015-07-03 (×13): qty 1

## 2015-07-03 MED ORDER — SENNA 8.6 MG PO TABS
1.0000 | ORAL_TABLET | Freq: Every day | ORAL | Status: DC
  Administered 2015-07-03 – 2015-07-08 (×5): 8.6 mg via ORAL
  Filled 2015-07-03 (×5): qty 1

## 2015-07-03 NOTE — Progress Notes (Signed)
Subjective: Patient was admitted due acute on chronic dysphagia and weight loss.  Objective: Vital signs in last 24 hours: Temp:  [97.8 F (36.6 C)-98.7 F (37.1 C)] 98.3 F (36.8 C) (06/10 0723) Pulse Rate:  [96-114] 96 (06/10 0723) Resp:  [18] 18 (06/10 0723) BP: (128-153)/(72-88) 151/86 mmHg (06/10 0723) SpO2:  [96 %-100 %] 98 % (06/10 0723) Weight:  [52.481 kg (115 lb 11.2 oz)] 52.481 kg (115 lb 11.2 oz) (06/09 1050) Weight change:  Last BM Date: 07/02/15  Intake/Output from previous day: 06/09 0701 - 06/10 0700 In: 1097.5 [I.V.:1097.5] Out: 400 [Urine:400]  PHYSICAL EXAM General appearance: cachectic and no distress Resp: diminished breath sounds bilaterally and rhonchi bilaterally Cardio: S1, S2 normal GI: soft, non-tender; bowel sounds normal; no masses,  no organomegaly Extremities: extremities normal, atraumatic, no cyanosis or edema  Lab Results:  Results for orders placed or performed during the hospital encounter of 07/02/15 (from the past 48 hour(s))  CBC     Status: Abnormal   Collection Time: 07/02/15  1:55 PM  Result Value Ref Range   WBC 10.3 4.0 - 10.5 K/uL   RBC 3.40 (L) 3.87 - 5.11 MIL/uL   Hemoglobin 10.4 (L) 12.0 - 15.0 g/dL   HCT 31.3 (L) 36.0 - 46.0 %   MCV 92.1 78.0 - 100.0 fL   MCH 30.6 26.0 - 34.0 pg   MCHC 33.2 30.0 - 36.0 g/dL   RDW 18.8 (H) 11.5 - 15.5 %   Platelets 242 150 - 400 K/uL  Basic metabolic panel     Status: Abnormal   Collection Time: 07/02/15  1:55 PM  Result Value Ref Range   Sodium 139 135 - 145 mmol/L   Potassium 3.7 3.5 - 5.1 mmol/L   Chloride 104 101 - 111 mmol/L   CO2 27 22 - 32 mmol/L   Glucose, Bld 82 65 - 99 mg/dL   BUN 15 6 - 20 mg/dL   Creatinine, Ser 0.97 0.44 - 1.00 mg/dL   Calcium 8.5 (L) 8.9 - 10.3 mg/dL   GFR calc non Af Amer >60 >60 mL/min   GFR calc Af Amer >60 >60 mL/min    Comment: (NOTE) The eGFR has been calculated using the CKD EPI equation. This calculation has not been validated in all  clinical situations. eGFR's persistently <60 mL/min signify possible Chronic Kidney Disease.    Anion gap 8 5 - 15    ABGS No results for input(s): PHART, PO2ART, TCO2, HCO3 in the last 72 hours.  Invalid input(s): PCO2 CULTURES No results found for this or any previous visit (from the past 240 hour(s)). Studies/Results: No results found.  Medications: I have reviewed the patient's current medications.  Assesment:   Principal Problem:   Dehydration Active Problems:   Dysphagia   Bilateral lung cancer (HCC)   Candida esophagitis (HCC)    Plan:  Medications reviewed GI consult appreciated  as per speechrecommendation     Agustine Rossitto 07/03/2015, 8:58 AM

## 2015-07-03 NOTE — Progress Notes (Signed)
  Subjective:  Patient continues to experience swallowing difficulty. She is even having difficulty swallowing saliva. Once again she states severe difficulty started 1 day after she ate at a fish restaurant. She had some dysphagia prior to that but nothing what is going through now. She also has noted swelling to her face and lips. She denies headache or extremity weakness.  Objective: Blood pressure 151/86, pulse 96, temperature 98.3 F (36.8 C), temperature source Oral, resp. rate 18, height '5\' 1"'$  (1.549 m), weight 115 lb 11.2 oz (52.481 kg), SpO2 98 %. Patient is alert. She is sitting at bedside trying to eat her lunch. She has mild edema to her face.  Labs/studies Results:   Recent Labs  07/02/15 1355  WBC 10.3  HGB 10.4*  HCT 31.3*  PLT 242    BMET   Recent Labs  07/02/15 1355  NA 139  K 3.7  CL 104  CO2 27  GLUCOSE 82  BUN 15  CREATININE 0.97  CALCIUM 8.5*      Assessment:  #1. Pharyngeal dysphagia. EGD on 06/16/2015 by Dr. Gala Romney revealed edema to inter arytenoid folds thought to be due to reaction to food or fish bone injury. She was also found to have Candida esophagitis and has been treated. Current symptoms are not due to Candida esophagitis. Subsequent ENT evaluation unremarkable. This edema may be secondary to prior radiation and may be a red herring. She is to be evaluated by speech pathologist on 07/05/2015. She may also benefit from MR of brain to make sure she does not have an [place] small infarct resulting in pharyngeal dysphagia. In the meantime will give her 4 doses of Decadron and see if it helps. #2. Weight loss secondary to diminished oral intake. #3. History of primary lung carcinoma stage III. Completed chemoradiation between September and November 2016. #4. Anemia appears to be chronic.   Recommendations:  Decadron 4 mg IV times every 6 hours. SLP evaluation on Monday. Consider MR of brain.

## 2015-07-04 NOTE — Progress Notes (Signed)
  Subjective:  Patient states her swallowing is not any better since she was given Decadron but she is not having leg pain anymore. She is able to swallow very few foods that are soft. He has difficulty with liquids. She denies shortness of breath or chest pain.  Objective: Blood pressure 168/90, pulse 90, temperature 98 F (36.7 C), temperature source Oral, resp. rate 18, height '5\' 1"'$  (1.549 m), weight 115 lb 11.2 oz (52.481 kg), SpO2 99 %. Patient is alert and in no acute distress. Cardiac exam with regular rhythm normal S1 and S2. Lungs are clear to auscultation. Abdomen is symmetrical soft and nontender without organomegaly or masses. No LE edema or clubbing noted. Skin is very dry.  Labs/studies Results:   Recent Labs  07/02/15 1355  WBC 10.3  HGB 10.4*  HCT 31.3*  PLT 242    BMET   Recent Labs  07/02/15 1355  NA 139  K 3.7  CL 104  CO2 27  GLUCOSE 82  BUN 15  CREATININE 0.97  CALCIUM 8.5*      Assessment:  #1.Pharyngeal dysphagia associated with significant weight loss due to diminished intake. Patient was given 4 doses of Decadron but without symptomatic improvement. Suspect etiology may be neurologic or neuromuscular body within intracranial. 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 Unless there is breakthrough she will need PEG placement to meet her nutritional needs. This has been discussed with patient previously and she is willing to do so.  Recommendations:  Await reevaluation by SLP. Consider MR brain.

## 2015-07-04 NOTE — Progress Notes (Signed)
Subjective: Patient feels slightly better today. She is stared on Decadron by Dr. Laural Golden.  Objective: Vital signs in last 24 hours: Temp:  [97.8 F (36.6 C)] 97.8 F (36.6 C) (06/11 0640) Pulse Rate:  [80-98] 80 (06/11 0640) Resp:  [18] 18 (06/11 0640) BP: (143-168)/(75-83) 168/83 mmHg (06/11 0640) SpO2:  [95 %-98 %] 95 % (06/11 0640) Weight change:  Last BM Date: 07/02/15  Intake/Output from previous day: 06/10 0701 - 06/11 0700 In: 720 [P.O.:720] Out: -   PHYSICAL EXAM General appearance: cachectic and no distress Resp: diminished breath sounds bilaterally and rhonchi bilaterally Cardio: S1, S2 normal GI: soft, non-tender; bowel sounds normal; no masses,  no organomegaly Extremities: extremities normal, atraumatic, no cyanosis or edema  Lab Results:  Results for orders placed or performed during the hospital encounter of 07/02/15 (from the past 48 hour(s))  CBC     Status: Abnormal   Collection Time: 07/02/15  1:55 PM  Result Value Ref Range   WBC 10.3 4.0 - 10.5 K/uL   RBC 3.40 (L) 3.87 - 5.11 MIL/uL   Hemoglobin 10.4 (L) 12.0 - 15.0 g/dL   HCT 31.3 (L) 36.0 - 46.0 %   MCV 92.1 78.0 - 100.0 fL   MCH 30.6 26.0 - 34.0 pg   MCHC 33.2 30.0 - 36.0 g/dL   RDW 18.8 (H) 11.5 - 15.5 %   Platelets 242 150 - 400 K/uL  Basic metabolic panel     Status: Abnormal   Collection Time: 07/02/15  1:55 PM  Result Value Ref Range   Sodium 139 135 - 145 mmol/L   Potassium 3.7 3.5 - 5.1 mmol/L   Chloride 104 101 - 111 mmol/L   CO2 27 22 - 32 mmol/L   Glucose, Bld 82 65 - 99 mg/dL   BUN 15 6 - 20 mg/dL   Creatinine, Ser 0.97 0.44 - 1.00 mg/dL   Calcium 8.5 (L) 8.9 - 10.3 mg/dL   GFR calc non Af Amer >60 >60 mL/min   GFR calc Af Amer >60 >60 mL/min    Comment: (NOTE) The eGFR has been calculated using the CKD EPI equation. This calculation has not been validated in all clinical situations. eGFR's persistently <60 mL/min signify possible Chronic Kidney Disease.    Anion gap 8 5  - 15    ABGS No results for input(s): PHART, PO2ART, TCO2, HCO3 in the last 72 hours.  Invalid input(s): PCO2 CULTURES No results found for this or any previous visit (from the past 240 hour(s)). Studies/Results: No results found.  Medications: I have reviewed the patient's current medications.  Assesment:   Principal Problem:   Dehydration Active Problems:   Dysphagia   Bilateral lung cancer (HCC)   Candida esophagitis (HCC)    Plan:  Medications reviewed Continue IV hydrationd  as per speechrecommendation   LOS: 1 day   Holleigh Crihfield 07/04/2015, 8:35 AM

## 2015-07-05 ENCOUNTER — Inpatient Hospital Stay (HOSPITAL_COMMUNITY): Payer: Medicaid Other

## 2015-07-05 ENCOUNTER — Encounter (HOSPITAL_COMMUNITY): Payer: Self-pay | Admitting: Internal Medicine

## 2015-07-05 ENCOUNTER — Ambulatory Visit (HOSPITAL_COMMUNITY)
Admission: AD | Admit: 2015-07-05 | Discharge: 2015-07-05 | Disposition: A | Discharge status: Home / Self Care | Payer: Medicaid Other | Source: Ambulatory Visit | Attending: Gastroenterology | Admitting: Gastroenterology

## 2015-07-05 ENCOUNTER — Ambulatory Visit (HOSPITAL_COMMUNITY)
Admission: RE | Admit: 2015-07-05 | Discharge: 2015-07-05 | Disposition: A | Discharge status: Home / Self Care | Payer: Medicaid Other | Source: Ambulatory Visit | Attending: Gastroenterology | Admitting: Gastroenterology

## 2015-07-05 DIAGNOSIS — R1312 Dysphagia, oropharyngeal phase: Secondary | ICD-10-CM

## 2015-07-05 DIAGNOSIS — R1310 Dysphagia, unspecified: Secondary | ICD-10-CM

## 2015-07-05 DIAGNOSIS — E860 Dehydration: Secondary | ICD-10-CM

## 2015-07-05 MED ORDER — FLUCONAZOLE 100 MG PO TABS
200.0000 mg | ORAL_TABLET | Freq: Every day | ORAL | Status: DC
  Administered 2015-07-07 – 2015-07-08 (×2): 200 mg via ORAL
  Filled 2015-07-05 (×3): qty 2

## 2015-07-05 MED ORDER — FLUCONAZOLE 100 MG PO TABS: 200.0000 mg | ORAL_TABLET | Freq: Every day | ORAL | Status: DC

## 2015-07-05 MED ORDER — AMLODIPINE BESYLATE 5 MG PO TABS
10.0000 mg | ORAL_TABLET | Freq: Every day | ORAL | Status: DC
  Administered 2015-07-05 – 2015-07-08 (×3): 10 mg via ORAL
  Filled 2015-07-05 (×4): qty 2

## 2015-07-05 MED ORDER — GADOBENATE DIMEGLUMINE 529 MG/ML IV SOLN
10.0000 mL | Freq: Once | INTRAVENOUS | Status: AC | PRN
  Administered 2015-07-05: 10 mL via INTRAVENOUS

## 2015-07-05 NOTE — Progress Notes (Addendum)
Speech Pathology  Pt will be going to [facility] for MBSS today at [facility]; transportation arranged through [facility].  Thank you,  Genene Churn, CCC-SLP [phone]   Addendum: [facility] now taking patient.  Orvil Feil, ANP-BC [facility]

## 2015-07-05 NOTE — Progress Notes (Signed)
cc'ed to pcp °

## 2015-07-05 NOTE — Progress Notes (Signed)
Late entry:  Patient to transported by [facility] to [facility] for test.  Laban Emperor, NP with GI notified and agreed and gave order to saline lock IV for transport.  Patient stable at time of EMS pickup.

## 2015-07-05 NOTE — Progress Notes (Signed)
Laban Emperor, NP gave order for patient to have single ice chips after oral care.

## 2015-07-05 NOTE — Progress Notes (Signed)
    Subjective: Did better with oatmeal and applesauce today. Took her time and it went down. Ate more applesauce than oatmeal. "hit or miss". Son states Dr. Erik Obey would like to be informed of what is going on.   Objective: Vital signs in last 24 hours: Temp:  [98 F (36.7 C)-98.7 F (37.1 C)] 98.7 F (37.1 C) (06/12 0600) Pulse Rate:  [80-94] 94 (06/12 0600) Resp:  [18] 18 (06/12 0600) BP: (148-168)/(75-90) 148/89 mmHg (06/12 0600) SpO2:  [93 %-99 %] 97 % (06/12 0600) Last BM Date: 07/03/15 (per patient) General:   Alert and oriented, pleasant Head:  Normocephalic and atraumatic. Eyes:  No icterus, sclera clear. Conjuctiva pink.  Abdomen:  Bowel sounds present, soft, non-tender, non-distended. No HSM or hernias noted. No rebound or guarding. No masses appreciated  Extremities:  Without  edema. Neurologic:  Alert and  oriented x4 Psych:  Alert and cooperative. Normal mood and affect.  Intake/Output from previous day: 06/11 0701 - 06/12 0700 In: 720 [P.O.:720] Out: -  Intake/Output this shift:    Lab Results:  Recent Labs  07/02/15 1355  WBC 10.3  HGB 10.4*  HCT 31.3*  PLT 242   BMET  Recent Labs  07/02/15 1355  NA 139  K 3.7  CL 104  CO2 27  GLUCOSE 82  BUN 15  CREATININE 0.97  CALCIUM 8.5*    Assessment: 60 year old female with history of bilateral lung cancer (August/September 2016), finishing chemoradiation in November 2016, presenting with worsening dysphagia and inability to tolerate liquids or oral medications. Interestingly, soft foods are able to occasionally be tolerated. Frank aspiration noted on outpatient barium swallow 2 days ago. She is followed by Dr. Erik Obey with ENT and Radiation Oncology,  both raising the consideration for a PEG tube if persistent difficulties with po intake. From a GI standpoint, she has undergone an EGD as of Jun 16, 2015, with findings of candida esophagitis, gastric and duodenal erosions, and edematous cricopharyngeus.  Diflucan started on 5/25 with need to continue for 21 days; it appears she may have completed 16 days.  Speech evaluation at bedside on 07/02/15 noting improvement in swallow function with chin tuck strategy. Patient continues to have significant dysphagia and needs an MBSS to wrap up the GI work-up prior to any consideration for a PEG tube. As of note, ENT Dr. Erik Obey stated in documentation that the next step would be esophagoscopy/laryngoscopy with dilation and possible Botox to the cricopharyngeus. I will touch base with him after MBSS completed.   Plan: MBSS at [facility] today  Await MRI results Will reach out to Dr. Erik Obey with update after MBSS completed May ultimately need PEG tube in the future: will await findings Resume Diflucan 200 mg while inpatient to complete course (5 additional days)  Orvil Feil, ANP-BC [facility] Gastroenterology     LOS: 2 days    07/05/2015, 10:06 AM   Attending note:  We'll review the MBS study as it becomes available.

## 2015-07-05 NOTE — Progress Notes (Signed)
Dr. Willey Blade notified of patient's blood pressure and patient's home medication Norvasc.  Dr. Willey Blade gave order to start Norvasc 10 mg daily.    Megan Emperor, NP with GI called and gave RN order for patient to have soft diet of mashed potatoes, gravy, applesauce for evening meal and then NPO.

## 2015-07-05 NOTE — Progress Notes (Signed)
Patient returned from [facility] [place] report of MBS received from [place], [facility] with electronic report to follow.  Laban Emperor, NP paged.  Laban Emperor, NP returned page and notified of MBS report.  Laban Emperor, NP gave order for patient to be NPO hold the Diflucan and Carafate at this time.

## 2015-07-05 NOTE — Progress Notes (Signed)
   07/05/15 1258  Medical Necessity for Transport Certificate --- IF THIS TRANSPORT IS ROUND TRIP OR SCHEDULED AND REPEATED, A PHYSICIAN MUST COMPLETE THIS FORM  Transport from: (Location) [facility]  Transport to (Location) [facility]  Date of Transport Service 07/05/15  Reason for Transport Procedure  Name of [facility]  Round Trip Transport? Yes  Q1 Are ALL the following "true" for this patient?              1. Unable to get up from bed without assistance  AND            2. Unable to ambulate AND        3. Unable to sit in a chair, including a wheelchair. Yes  Q2 Could the patient be transported safely by other means of transportation (I.E., wheelchair van)? No  Reason for transport - patient condition Requires IV maintenance  Q3 Reason based on Facility and/or [employer] not available at original facility  Specific Services Other (Comment)  Hydrographic surveyor (can be: Physician, RN, NP, PA, DP, CNS) Orvil Feil, ANP-BC  Date Signed 07/05/15

## 2015-07-05 NOTE — Discharge Summary (Signed)
Physician Discharge Summary  Patient ID: Megan Sims MRN: [id number] DOB/AGE: 08/03/1955 60 y.o. Primary Care Physician:Jahki Witham, MD Admit date: 06/14/2015 Discharge date: 06/17/2015   Discharge Diagnoses:   Principal Problem:   Dysphagia Active Problems:   Essential hypertension   Bilateral lung cancer (HCC)   Oral thrush   Mucosal abnormality of stomach   Candida esophagitis (HCC)   Anemia due to other cause     Medication List    STOP taking these medications        EPINEPHrine 0.3 mg/0.3 mL Soaj injection  Commonly known as:  EPI-PEN     predniSONE 10 MG (21) Tbpk tablet  Commonly known as:  STERAPRED UNI-PAK 21 TAB      TAKE these medications        amLODipine 10 MG tablet  Commonly known as:  NORVASC  Take 1 tablet (10 mg total) by mouth daily.     chlorpheniramine-HYDROcodone 10-8 MG/5ML Suer  Commonly known as:  TUSSIONEX  Take 5 mLs by mouth every 12 (twelve) hours as needed for cough. Reported on 07/02/2015     fluconazole 200 MG tablet  Commonly known as:  DIFLUCAN  Take two ('400mg'$ ) tabs on day 1, then continue one tab ('200mg'$ ) for 20 days.     hydrOXYzine 25 MG tablet  Commonly known as:  ATARAX/VISTARIL  Take 1 tablet (25 mg total) by mouth 3 (three) times daily.     sucralfate 1 GM/10ML suspension  Commonly known as:  CARAFATE  Take 10 mLs (1 g total) by mouth 4 (four) times daily -  with meals and at bedtime.     zolpidem 10 MG tablet  Commonly known as:  AMBIEN  Take 1 tablet (10 mg total) by mouth at bedtime as needed for sleep.        Discharged Condition: improved     Consults: GI  Significant Diagnostic Studies: Dg Chest 2 View  06/14/2015  CLINICAL DATA:  Acute onset of difficulty swallowing and vomiting. Sore throat. Initial encounter. EXAM: CHEST  2 VIEW COMPARISON:  Chest radiograph performed 05/24/2015 FINDINGS: The lungs are well-aerated. Patchy right midlung airspace opacities raise concern for pneumonia. This is new from  the recent prior study. There is no evidence of pleural effusion or pneumothorax. The heart is normal in size; the mediastinal contour is within normal limits. A left-sided chest port is noted ending about the mid SVC. No acute osseous abnormalities are seen. IMPRESSION: Patchy right-sided airspace opacities, new from the recent prior study, raise concern for pneumonia. Electronically Signed   By: Garald Balding M.D.   On: 06/14/2015 18:32   Ct Head Wo Contrast  06/14/2015  CLINICAL DATA:  Initial evaluation for acute difficulty swelling. EXAM: CT HEAD WITHOUT CONTRAST TECHNIQUE: Contiguous axial images were obtained from the base of the skull through the vertex without intravenous contrast. COMPARISON:  Prior studies from 06/14/2015. FINDINGS: Postoperative changes from prior bilateral craniotomies again seen. 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 Encephalomalacia of within the anterior right temporal pole. Changes are stable. Atrophy with chronic microvascular ischemic disease. Remote lacunar infarct within the left caudate head. Additional remote lacunar infarct within the right basal ganglia. No acute intracranial hemorrhage. No definite acute large vessel territory infarct, although evaluation somewhat limited by streak artifact from aneurysm clips. No mass lesion, midline shift, or mass effect. No hydrocephalus. No extra-axial fluid collection. Scalp soft tissues within normal limits. No acute abnormality about the orbits. Paranasal sinuses are clear. Remote  defect at the right lamina papyracea noted. No mastoid effusion. No acute abnormality about the calvarium.  Post craniotomy changes. IMPRESSION: 1. No acute intracranial process. 2. Stable postoperative changes, generalized cerebral atrophy, and chronic small vessel ischemic disease. 3. 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 Electronically Signed   By: Jeannine Boga M.D.   On: 06/14/2015 21:12   Ct  Soft Tissue Neck W Contrast  06/14/2015  CLINICAL DATA:  60 year old female with history of lung cancer. Difficulty swallowing and sore throat for the past week and a half. Subsequent encounter. EXAM: CT NECK WITH CONTRAST TECHNIQUE: Multidetector CT imaging of the neck was performed using the standard protocol following the bolus administration of intravenous contrast. CONTRAST:  45m ISOVUE-300 IOPAMIDOL (ISOVUE-300) INJECTION 61% COMPARISON:  06/14/2015 chest x-ray. CT of the chest performed same date is dictated separately. FINDINGS: Pharynx and larynx: Symmetric mild prominence of palatine tonsils may reflect result of mild inflammation. No deep drainable abscess noted. Salivary glands: No mass or inflammation noted. Thyroid: No mass identified. Lymph nodes: Scattered normal to top-normal size lymph nodes. Vascular: Bilateral carotid bifurcation calcifications with hemodynamically significant stenosis suspected but incompletely assessed. Limited intracranial: Negative. Visualized orbits: Not imaged. Mastoids and visualized paranasal sinuses: Portions visualized clear. Skeleton: No osseous destructive lesion. Mild cervical spondylotic changes. Upper chest: No apical mass. Please see chest CT report dictated separately. IMPRESSION: Symmetric mild prominence of palatine tonsils may reflect result of mild inflammation. Bilateral carotid bifurcation calcifications with hemodynamically significant stenosis suspected but incompletely assessed. Chest CT report dictated separately. Electronically Signed   By: SGenia DelM.D.   On: 06/14/2015 20:07   Ct Chest Wo Contrast  06/14/2015  CLINICAL DATA:  Abnormal chest radiograph. Assess pneumonia. Personal history of lung cancer. Initial encounter. EXAM: CT CHEST WITHOUT CONTRAST TECHNIQUE: Multidetector CT imaging of the chest was performed following the standard protocol without IV contrast. COMPARISON:  CTA of the chest performed 05/03/2015, and chest radiograph  performed earlier today at 6:23 p.m. FINDINGS: Mild focal opacities along the right minor fissure and at the right upper lobe are similar in appearance to prior CTA, though less hazy, but are significantly more prominent on chest radiograph. This may be projectional on chest radiograph, or may reflect interval redistribution of atypical infection superimposed on chronic scarring. The left lung appears relatively clear. No pleural effusion or pneumothorax is seen. No dominant mass is seen to suggest recurrent malignancy. Diffuse coronary artery calcifications are seen. Trace pericardial fluid remains within normal limits. No mediastinal lymphadenopathy is seen. No pericardial effusion is identified. Scattered calcification is noted along the aortic arch and proximal great vessels. A left-sided chest port is noted ending about the mid SVC. The visualized portions of the thyroid gland are unremarkable. No axillary lymphadenopathy is seen. The visualized portions of the liver are unremarkable. The spleen is diminutive. The visualized portions of the gallbladder, pancreas, adrenal glands and kidneys are within normal limits. No acute osseous abnormalities are identified. 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 IMPRESSION: 1. Small focal opacities along the right minor fissure and at the right upper lobe are similar in appearance to the prior CTA, though less hazy. However, these are significantly more prominent on the corresponding chest radiograph. This may be projectional on chest radiograph, or may reflect interval redistribution of atypical infection, superimposed on chronic scarring. 2. No definite evidence for recurrence of malignancy. 3. Diffuse coronary artery calcifications seen. 4. Changes of Paget's disease again noted  at T11 and L1. Electronically Signed   By: Garald Balding M.D.   On: 06/14/2015 20:01   Mr Jeri Cos CB Contrast  07/05/2015  CLINICAL DATA:  Dysphagia for 2 weeks. Evaluation  for stroke. History of lung cancer and cerebral aneurysm clipping. EXAM: MRI HEAD WITHOUT AND WITH CONTRAST TECHNIQUE: Multiplanar, multiecho pulse sequences of the brain and surrounding structures were obtained without and with intravenous contrast. CONTRAST:  3m MULTIHANCE GADOBENATE DIMEGLUMINE 529 MG/ML IV SOLN COMPARISON:  Head CT 06/14/2015 and MRI 10/02/2014 FINDINGS: Sequelae of prior pterional craniotomies are again identified with ICA region aneurysm clips present bilaterally resulting in regional susceptibility artifact. There is no evidence of acute infarct, intracranial hemorrhage, mass, midline shift, or extra-axial fluid collection. There is mild cerebral atrophy. Chronic lacunar infarcts are again seen in the left pons, right thalamus, and bilateral basal ganglia. There is also chronic encephalomalacia in the anterior right greater than left temporal lobes and involving the subcortical white matter of the anterior left frontal lobe. Patchy T2 hyperintensities elsewhere in the cerebral white matter bilaterally are unchanged from the prior MRI with greatest involvement of the periventricular regions. Gliosis from old left frontal lobe ventriculostomy catheter tract. No abnormal enhancement is identified. Old right medial orbital blowout fracture is again noted. Paranasal sinuses and mastoid air cells are clear. Major intracranial vascular flow voids are preserved. IMPRESSION: 1. No acute intracranial abnormality or mass. 2. Moderate cerebral white matter disease, unchanged and nonspecific but compatible with chronic small vessel ischemia with chronic lacunar infarcts as above. Electronically Signed   By: ALogan BoresM.D.   On: 07/05/2015 10:42   Dg Esophagus  06/30/2015  CLINICAL DATA:  Dysphagia, weight loss, history of lung carcinoma EXAM: ESOPHOGRAM/BARIUM SWALLOW TECHNIQUE: Single contrast examination was performed using  thin barium. FLUOROSCOPY TIME:  Radiation Exposure Index (as provided by  the fluoroscopic device): 7.5 deciGy per square cm If the device does not provide the exposure index: Fluoroscopy Time:  18 seconds of fluoroscopy time Number of Acquired Images: COMPARISON:  Barium swallow of 09/16/2014 FINDINGS: After reviewing the prior barium swallow from 09/16/2014, the study was begun in the lateral projection. The patient swallowed barium and considerable residual barium is noted within the vallecular and piriform sinuses. The patient then demonstrated frank aspiration with some cough response. In view of this frank aspiration, the remainder of the esophagus could not be evaluated. No gross abnormality is evident on the single swallow obtained. IMPRESSION: Frank aspiration with cough response. Electronically Signed   By: PIvar DrapeM.D.   On: 06/30/2015 11:36    Lab Results: Basic Metabolic Panel:  Recent Labs  07/02/15 1355  NA 139  K 3.7  CL 104  CO2 27  GLUCOSE 82  BUN 15  CREATININE 0.97  CALCIUM 8.5*   Liver Function Tests: No results for input(s): AST, ALT, ALKPHOS, BILITOT, PROT, ALBUMIN in the last 72 hours.   CBC:  Recent Labs  07/02/15 1355  WBC 10.3  HGB 10.4*  HCT 31.3*  MCV 92.1  PLT 242    No results found for this or any previous visit (from the past 240 hour(s)).   Hospital Course:  This is a 60years old female with history of multiple medical illnesses including history of ca of the lung s/p post radiation and chemotherapy was admitted due to dysphagia. Patient was seen by GI and endoscopy was performed. Patient was found to have candidal esophagitis and she was started on diflucan. Patient was  discharged in stable condition to complete her diflucan at out patient.  Discharge Exam: Blood pressure 138/71, pulse 100, temperature 97.8 F (36.6 C), temperature source Oral, resp. rate 18, height '5\' 1"'$  (1.549 m), weight 51.619 kg (113 lb 12.8 oz), SpO2 92 %.   Disposition: Home       Signed: Ravynn Hogate   07/05/2015, 1:45  PM

## 2015-07-05 NOTE — Progress Notes (Signed)
I contacted Dr. Noreene Filbert office and spoke with Ivin Booty, the nurse working with him today. Notified RN of MBSS and findings, who will relay this to him. I have left my number to call.   Orvil Feil, ANP-BC [facility]

## 2015-07-05 NOTE — Progress Notes (Signed)
   07/05/15 1301  Section 1: Physician Certification  Patient Condition Patient stabilized;Benefit outweighs risk  Reason for Transfer MD request  Relationship to patient Admitting Physician  Benefits of Transfer Necessities available at receiving facility  Risks of Transfer Time delay;Deterioration;Loss of IV;Discomfort;Accidents or delays   Physician Reassessment Patient examined and risks and benefits explained

## 2015-07-05 NOTE — Progress Notes (Signed)
Subjective: Patient is still unable to swallow. Dr. Laural Golden has recommended MRI of brain and MRI of the brain to evaluate for any new stroke.  Objective: Vital signs in last 24 hours: Temp:  [98 F (36.7 C)-98.7 F (37.1 C)] 98.7 F (37.1 C) (06/12 0600) Pulse Rate:  [80-94] 94 (06/12 0600) Resp:  [18] 18 (06/12 0600) BP: (148-168)/(75-90) 148/89 mmHg (06/12 0600) SpO2:  [93 %-99 %] 97 % (06/12 0600) Weight change:  Last BM Date: 07/02/15  Intake/Output from previous day: 06/11 0701 - 06/12 0700 In: 720 [P.O.:720] Out: -   PHYSICAL EXAM General appearance: cachectic and no distress Resp: diminished breath sounds bilaterally and rhonchi bilaterally Cardio: S1, S2 normal GI: soft, non-tender; bowel sounds normal; no masses,  no organomegaly Extremities: extremities normal, atraumatic, no cyanosis or edema  Lab Results:  No results found for this or any previous visit (from the past 48 hour(s)).  ABGS No results for input(s): PHART, PO2ART, TCO2, HCO3 in the last 72 hours.  Invalid input(s): PCO2 CULTURES No results found for this or any previous visit (from the past 240 hour(s)). Studies/Results: No results found.  Medications: I have reviewed the patient's current medications.  Assesment:   Principal Problem:   Dehydration Active Problems:   Dysphagia   Bilateral lung cancer (HCC)   Candida esophagitis (HCC)    Plan:  Medications reviewed Continue IV hydrationd Will do MRI As per GI recommendation   LOS: 2 days   Megan Sims 07/05/2015, 8:07 AM

## 2015-07-05 NOTE — Evaluation (Signed)
Physical Therapy Evaluation Patient Details Name: Megan Sims MRN: [id number] DOB: 1955-07-03 Today's Date: 07/05/2015   History of Present Illness  60 yo F admitted with worsening of chronic dysphagia, weight loss, and poor oral intake. Dehydration, and Candida esophagitis, gastric and duodenal erosions. Possible CVA? 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  Clinical Impression  Pt received in bed, multiple family members present, and pt is agreeable to PT evaluation.  Pt is normally independent with ambulation - but is a furniture walker, and she is independent with ADL's.  She relies on other family to assist her with running errands and getting to doctor's appointments.  Today, she required Min guard for ambulation due to unsteadiness.  Pt ambulated 2x63f - the first trial she did not use an AD, but held onto the wall rail.  The 2nd trial she used a cane, but stated that she felt very unsteady with it, like she was going to fall.  Will attempt gait with quad cane at next visit.  Recommend that she f/u with HHPT vs OPPT, and she may need a quad cane upon d/c.      Follow Up Recommendations Home health PT;Outpatient PT    Equipment Recommendations  Other (comment) (May need quad cane, however, still TBD)    Recommendations for Other Services       Precautions / Restrictions Precautions Precautions: Fall Precaution Comments: Pt states she had a fall in the hospital when she was getting up off the commode, and went down to her knees.  Restrictions Weight Bearing Restrictions: No      Mobility  Bed Mobility Overal bed mobility: Modified Independent                Transfers Overall transfer level: Needs assistance   Transfers: Sit to/from Stand Sit to Stand: Min  guard            Ambulation/Gait Ambulation/Gait assistance: Min guard Ambulation Distance (Feet): 60 Feet Assistive device: None Gait Pattern/deviations: Step-through pattern;Trunk flexed     General Gait Details: decreased cadence with pt holding onto the wall rail.  Pt then required a seated rest break.  Ambulated another 616fwith [place] on the right, hwoever pt states that she feels unsteadk and like she is going to fall with the [place].  May try QC next tx.   Stairs            Wheelchair Mobility    Modified Rankin (Stroke Patients Only)       Balance Overall balance assessment: Needs assistance         Standing balance support: Single extremity supported Standing balance-Leahy Scale: Fair                               Pertinent Vitals/Pain Pain Assessment: 0-10 Pain Score: 8  Pain Location: L knee Pain Descriptors / Indicators: Sharp Pain Intervention(s): Limited activity within patient's tolerance    Home Living   Living Arrangements: Spouse/significant other Available Help at Discharge: Family (Son assists with taking her to appts. ) Type of Home: House Home Access: Stairs to enter   [employer] Steps: 1+1 Home Layout: One level Home Equipment: [place] single point;Bedside commode      Prior Function  Comments: Pt reports she was independent with ambulation, however she was a furniture walker, she was able to complete ADL's with increased time. Son drives her to different appointments because her husband works during the day.      Hand Dominance        Extremity/Trunk Assessment   Upper Extremity Assessment: Generalized weakness           Lower Extremity Assessment: Generalized weakness         Communication   Communication: No difficulties  Cognition Arousal/Alertness: Awake/alert Behavior During Therapy: WFL for tasks assessed/performed Overall Cognitive Status: Within Functional Limits for  tasks assessed                      General Comments      Exercises        Assessment/Plan    PT Assessment Patient needs continued PT services  PT Diagnosis Difficulty walking;Abnormality of gait;Generalized weakness   PT Problem List Decreased strength;Decreased activity tolerance;Decreased balance;Decreased mobility;Decreased knowledge of use of DME;Decreased safety awareness;Decreased knowledge of precautions;Pain  PT Treatment Interventions Gait training;DME instruction;Functional mobility training;Therapeutic activities;Stair training;Therapeutic exercise;Balance training;Patient/family education   PT Goals (Current goals can be found in the Care Plan section) Acute Rehab PT Goals Patient Stated Goal: To get stronger and go home.  PT Goal Formulation: With patient/family Time For Goal Achievement: 07/12/15 Potential to Achieve Goals: Fair    Frequency Min 3X/week   Barriers to discharge        Co-evaluation               End of Session Equipment Utilized During Treatment: Gait belt Activity Tolerance: Patient tolerated treatment well Patient left: in chair;with call bell/phone within reach;with family/visitor present Nurse Communication: Mobility status    Functional Assessment Tool Used: [employer] "6-clicks"  Functional Limitation: Mobility: Walking and moving around Mobility: Walking and Moving Around Current Status [phone]): At least 40 percent but less than 60 percent impaired, limited or restricted Mobility: Walking and Moving Around Goal Status [phone]): At least 20 percent but less than 40 percent impaired, limited or restricted    Time: 1035-1055 PT Time Calculation (min) (ACUTE ONLY): 20 min   Charges:   PT Evaluation $PT Eval Low Complexity: 1 Procedure     PT G Codes:   PT G-Codes **NOT FOR INPATIENT CLASS** Functional Assessment Tool Used: [employer] "6-clicks"  Functional Limitation: Mobility: Walking  and moving around Mobility: Walking and Moving Around Current Status [phone]): At least 40 percent but less than 60 percent impaired, limited or restricted Mobility: Walking and Moving Around Goal Status [phone]): At least 20 percent but less than 40 percent impaired, limited or restricted   Beth Murvin Gift, PT, DPT X: [phone]

## 2015-07-06 ENCOUNTER — Ambulatory Visit: Payer: Medicaid Other | Admitting: Physical Therapy

## 2015-07-06 ENCOUNTER — Encounter (HOSPITAL_COMMUNITY): Payer: Self-pay | Admitting: *Deleted

## 2015-07-06 ENCOUNTER — Encounter (HOSPITAL_COMMUNITY)
Admission: AD | Disposition: A | Discharge status: Home Health | Payer: Self-pay | Source: Ambulatory Visit | Attending: Internal Medicine

## 2015-07-06 DIAGNOSIS — K222 Esophageal obstruction: Secondary | ICD-10-CM

## 2015-07-06 DIAGNOSIS — K2980 Duodenitis without bleeding: Secondary | ICD-10-CM

## 2015-07-06 DIAGNOSIS — K2970 Gastritis, unspecified, without bleeding: Secondary | ICD-10-CM

## 2015-07-06 HISTORY — PX: ESOPHAGOGASTRODUODENOSCOPY: SHX5428

## 2015-07-06 HISTORY — PX: PEG PLACEMENT: SHX5437

## 2015-07-06 SURGERY — INSERTION, PEG TUBE
Anesthesia: Moderate Sedation

## 2015-07-06 MED ORDER — MORPHINE SULFATE (PF) 2 MG/ML IV SOLN
1.0000 mg | Freq: Once | INTRAVENOUS | Status: AC
  Administered 2015-07-06: 1 mg via INTRAVENOUS
  Filled 2015-07-06: qty 1

## 2015-07-06 MED ORDER — CEFAZOLIN SODIUM-DEXTROSE 2-4 GM/100ML-% IV SOLN
INTRAVENOUS | Status: AC
  Filled 2015-07-06: qty 100

## 2015-07-06 MED ORDER — PANTOPRAZOLE SODIUM 40 MG PO PACK
40.0000 mg | PACK | Freq: Two times a day (BID) | ORAL | Status: DC
  Administered 2015-07-06 – 2015-07-08 (×4): 40 mg
  Filled 2015-07-06 (×2): qty 20

## 2015-07-06 MED ORDER — LIDOCAINE VISCOUS 2 % MT SOLN
OROMUCOSAL | Status: AC
  Filled 2015-07-06: qty 15

## 2015-07-06 MED ORDER — LIDOCAINE 1 % OPTIME INJ - NO CHARGE
INTRAMUSCULAR | Status: DC | PRN
  Administered 2015-07-06: 3 mg

## 2015-07-06 MED ORDER — MIDAZOLAM HCL 5 MG/5ML IJ SOLN
INTRAMUSCULAR | Status: AC
  Filled 2015-07-06: qty 10

## 2015-07-06 MED ORDER — STERILE WATER FOR IRRIGATION IR SOLN
Status: DC | PRN
  Administered 2015-07-06: 15:00:00

## 2015-07-06 MED ORDER — TOPICALS DOCUMENTATION OPTIME
TOPICAL | Status: DC | PRN
  Administered 2015-07-06: 1 via TOPICAL

## 2015-07-06 MED ORDER — CEFAZOLIN SODIUM-DEXTROSE 2-4 GM/100ML-% IV SOLN: 2.0000 g | Freq: Once | INTRAVENOUS | Status: DC

## 2015-07-06 MED ORDER — MEPERIDINE HCL 100 MG/ML IJ SOLN
INTRAMUSCULAR | Status: DC | PRN
  Administered 2015-07-06 (×2): 25 mg via INTRAVENOUS

## 2015-07-06 MED ORDER — CEFAZOLIN SODIUM-DEXTROSE 2-4 GM/100ML-% IV SOLN
2.0000 g | Freq: Once | INTRAVENOUS | Status: AC
  Administered 2015-07-06: 2 g via INTRAVENOUS

## 2015-07-06 MED ORDER — MIDAZOLAM HCL 5 MG/5ML IJ SOLN
INTRAMUSCULAR | Status: DC | PRN
  Administered 2015-07-06 (×2): 2 mg via INTRAVENOUS

## 2015-07-06 MED ORDER — SODIUM CHLORIDE 0.9 % IV SOLN
INTRAVENOUS | Status: DC
  Administered 2015-07-06: 13:00:00 via INTRAVENOUS

## 2015-07-06 MED ORDER — MEPERIDINE HCL 100 MG/ML IJ SOLN
INTRAMUSCULAR | Status: AC
  Filled 2015-07-06: qty 2

## 2015-07-06 NOTE — Brief Op Note (Signed)
07/02/2015 - 07/06/2015 NO NTE IN PROVATION DUE TO SYSTEM ERROR  5:16 PM  PATIENT:  Megan Sims  60 y.o. female  PRE-OPERATIVE DIAGNOSIS:  dysphagia  POST-OPERATIVE DIAGNOSIS:  duodenitis; gastritis; duodenal ulcer; esophageal stricture; PEG placement. skin @ 1.5; bumper @ 2.5  PROCEDURE:  Procedure(s): PERCUTANEOUS ENDOSCOPIC GASTROSTOMY (PEG) PLACEMENT (N/A) ESOPHAGOGASTRODUODENOSCOPY (EGD) (N/A)  SURGEON:  Surgeon(s) and Role:    * Danie Binder, MD - Primary  RECOMMENDATIONS: 1. USE PEG FOR MEDS/FLUSH TONIGHT 2. MAY START PEG FEEDS IN THE AM PER NUTRITION RECOMMENDATIONS.

## 2015-07-06 NOTE — H&P (Signed)
Primary Care Physician:  Rosita Fire, MD Primary Gastroenterologist:  Dr. Oneida Alar  Pre-Procedure History & Physical: HPI:  Megan Sims is a 60 y.o. female here for   Past Medical History  Diagnosis Date  . Migraine   . Lung abnormality   . Hypertension   . Pleurisy   . Cerebral aneurysm 2 brain surgeries 96 or 97  . Paget disease of bone   . Diarrhea   . Renal insufficiency     Patient states " no kidney problems  . S/P biopsy     of throat per patient.  . Oral thrush 09/24/2014  . Nicotine dependence 09/24/2014  . Chronic neck pain 09/24/2014  . Skin ulcer ([place]) 11/18/2014  . Emphysema of lung ([place]) 05/05/2015  . Malignant neoplasm of unknown origin ([place])   . Cancer [facility])     malignant neoplasm of unknown origin    Past Surgical History  Procedure Laterality Date  . Chest tube insertion    . Tubal ligation    . Cerebral aneurysm repair  96 or 97  . Colonoscopy      10 years ago in [place]   . Esophagogastroduodenoscopy  06/16/2015    Dr. Gala Romney: edentulous cricopharyngeus, esophageal plaques, biopsy consistent with candida  . Laryngoscopy  Jun 22, 2015    [facility], Dr. Erik Obey: normal oropharynx, no lesions, mobile vocal cords with good airway.  . Esophagogastroduodenoscopy N/A 06/16/2015    Procedure: ESOPHAGOGASTRODUODENOSCOPY (EGD);  Surgeon: Daneil Dolin, MD;  Location: [facility];  Service: Endoscopy;  Laterality: N/A;    Prior to Admission medications   Medication Sig Start Date End Date Taking? Authorizing Provider  amLODipine (NORVASC) 10 MG tablet Take 1 tablet (10 mg total) by mouth daily. 11/11/14  Yes Heath Lark, MD  chlorpheniramine-HYDROcodone (TUSSIONEX) 10-8 MG/5ML SUER Take 5 mLs by mouth every 12 (twelve) hours as needed for cough. Reported on 07/02/2015 05/05/15  Yes Historical Provider, MD  hydrOXYzine (ATARAX/VISTARIL) 25 MG tablet Take 1 tablet (25 mg total) by mouth 3 (three) times daily. Patient taking differently: Take 25 mg by mouth every 8  (eight) hours as needed for itching.  06/01/15  Yes Rosita Fire, MD  zolpidem (AMBIEN) 10 MG tablet Take 1 tablet (10 mg total) by mouth at bedtime as needed for sleep. 06/01/15  Yes Rosita Fire, MD  fluconazole (DIFLUCAN) 200 MG tablet Take two ('400mg'$ ) tabs on day 1, then continue one tab ('200mg'$ ) for 20 days. Patient not taking: Reported on 07/02/2015 06/17/15   Mahala Menghini, PA-C  sucralfate (CARAFATE) 1 GM/10ML suspension Take 10 mLs (1 g total) by mouth 4 (four) times daily -  with meals and at bedtime. 06/17/15   Rosita Fire, MD    Allergies as of 07/02/2015  . (No Known Allergies)    Family History  Problem Relation Age of Onset  . Hypertension    . Diabetes    . Kidney disease    . Cancer Mother     throat ca  . Cancer Maternal Grandmother     thyroid ca  . Colon cancer Neg Hx     Social History   Social History  . Marital Status: Married    Spouse Name: N/A  . Number of Children: N/A  . Years of Education: N/A   Occupational History  . Not on file.   Social History Main Topics  . Smoking status: Former Smoker -- 0.50 packs/day for 34 years    Types: Cigarettes  . Smokeless  tobacco: Never Used     Comment: HOWEVER, will smoke 1 cigarette if "tragic" event (death), or if she needs it for her nerves   . Alcohol Use: No  . Drug Use: No  . Sexual Activity: Not on file   Other Topics Concern  . Not on file   Social History Narrative    Review of Systems: See HPI, otherwise negative ROS   Physical Exam: BP 160/82 mmHg  Pulse 92  Temp(Src) 97.8 F (36.6 C) (Oral)  Resp 17  Ht '5\' 1"'$  (1.549 m)  Wt 115 lb 11.2 oz (52.481 kg)  BMI 21.87 kg/m2  SpO2 99% General:   Alert,  pleasant and cooperative in NAD Head:  Normocephalic and atraumatic. Neck:  Supple; Lungs:  Clear throughout to auscultation.    Heart:  Regular rate and rhythm. Abdomen:  Soft, nontender and nondistended. Normal bowel sounds, without guarding, and without rebound.   Neurologic:  Alert  and  oriented x4;  grossly normal neurologically.  Impression/Plan:     DYSPHAGIA  PLAN:  EGD-PEG TODAY. DISCUSSED PROCEDURE, BENEFITS, & RISKS: < 1% chance of medication reaction, UNINTENTIONAL PERFORATION, ASPIRATION, TUBE PLACE THROUGH COLON TO STOMACH, OR bleeding.

## 2015-07-06 NOTE — Progress Notes (Signed)
    Subjective: Requested mashed potatoes last night; was discussed with speech by myself after MBSS who said small amount for taste allowed but then NPO after midnight for possible PEG in am. Remains NPO. Desirous of PEG tube. Husband and son at bedside.   Objective: Vital signs in last 24 hours: Temp:  [98 F (36.7 C)-98.2 F (36.8 C)] 98 F (36.7 C) (06/13 0629) Pulse Rate:  [76-95] 87 (06/13 0629) Resp:  [20] 20 (06/13 0629) BP: (138-177)/(78-96) 138/78 mmHg (06/13 0629) SpO2:  [95 %-100 %] 100 % (06/13 0629) Last BM Date: 07/03/15 General:   Alert and oriented, pleasant Head:  Normocephalic and atraumatic. Abdomen:  Bowel sounds present, soft, non-tender, non-distended. No HSM or hernias noted. No rebound or guarding. No masses appreciated  Msk:  Symmetrical without gross deformities. Normal posture. Extremities:  Without edema. Neurologic:  Alert and  oriented x4 Psych:  Alert and cooperative. Normal mood and affect.  Intake/Output from previous day: 06/12 0701 - 06/13 0700 In: 5630 [P.O.:240; I.V.:5390] Out: 200 [Urine:200] Intake/Output this shift:      Assessment: 60 year old female with history of bilateral lung cancer (August/September 2016), finishing chemoradiation in November 2016, presenting with worsening dysphagia and inability to tolerate liquids or oral medications. Interestingly, soft foods are able to occasionally be tolerated subjectively. However, she has been evaluated extensively by speech pathology with bedside swallow, MBSS, and found to have mild oral phase dysphagia and severe pharyngeal and cervical esophageal phase dysphagia. Moderate aspiration noted of all thin liquids. PEG tube discussion raised by ENT Dr. Erik Obey and Radiation Oncology. At this point, she is unable to maintain nutritional needs and needs PEG tube for nutrition. Recommend continued visits with Speech and ENT in the future. I will go ahead and request a nutrition consult for  assistance in planning enteral nutrition.   As of note: EGD as of Jun 16, 2015, with findings of candida esophagitis, gastric and duodenal erosions, and edematous cricopharyngeus. CT soft tissue neck several days before EGD without obvious foreign body. Concern has been raised due to edema of cricopharyngeus, as she had been eating fish with bones prior to presentation this year. However, she also has documented severe deficits in swallowing function.   I discussed the risks and benefits of PEG tube placement with patient and family members extensively. She desires to proceed with this.   Plan: Remain NPO PEG tube placement via endoscopic approach later today likely with Dr. Oneida Alar Continue with Speech pathology as outpatient Nutrition consultation   Orvil Feil, ANP-BC [facility] Gastroenterology    LOS: 3 days    07/06/2015, 8:55 AM

## 2015-07-06 NOTE — Progress Notes (Signed)
Subjective: Patient is NPO. She is planned for PEG placement today. MRI showed no acute intracranial abnormality or mass. Objective: Vital signs in last 24 hours: Temp:  [97.8 F (36.6 C)-98.2 F (36.8 C)] 97.8 F (36.6 C) (06/13 1257) Pulse Rate:  [76-95] 92 (06/13 1257) Resp:  [17-20] 17 (06/13 1257) BP: (138-177)/(78-96) 160/82 mmHg (06/13 1257) SpO2:  [95 %-100 %] 99 % (06/13 1257) Weight change:  Last BM Date: 07/03/15  Intake/Output from previous day: 06/12 0701 - 06/13 0700 In: 5630 [P.O.:240; I.V.:5390] Out: 200 [Urine:200]  PHYSICAL EXAM General appearance: cachectic and no distress Resp: diminished breath sounds bilaterally and rhonchi bilaterally Cardio: S1, S2 normal GI: soft, non-tender; bowel sounds normal; no masses,  no organomegaly Extremities: extremities normal, atraumatic, no cyanosis or edema  Lab Results:  No results found for this or any previous visit (from the past 48 hour(s)).  ABGS No results for input(s): PHART, PO2ART, TCO2, HCO3 in the last 72 hours.  Invalid input(s): PCO2 CULTURES No results found for this or any previous visit (from the past 240 hour(s)). Studies/Results: Mr Kizzie Fantasia Contrast  07/05/2015  CLINICAL DATA:  Dysphagia for 2 weeks. Evaluation for stroke. History of lung cancer and cerebral aneurysm clipping. EXAM: MRI HEAD WITHOUT AND WITH CONTRAST TECHNIQUE: Multiplanar, multiecho pulse sequences of the brain and surrounding structures were obtained without and with intravenous contrast. CONTRAST:  32m MULTIHANCE GADOBENATE DIMEGLUMINE 529 MG/ML IV SOLN COMPARISON:  Head CT 06/14/2015 and MRI 10/02/2014 FINDINGS: Sequelae of prior pterional craniotomies are again identified with ICA region aneurysm clips present bilaterally resulting in regional susceptibility artifact. There is no evidence of acute infarct, intracranial hemorrhage, mass, midline shift, or extra-axial fluid collection. There is mild cerebral atrophy. Chronic  lacunar infarcts are again seen in the left pons, right thalamus, and bilateral basal ganglia. There is also chronic encephalomalacia in the anterior right greater than left temporal lobes and involving the subcortical white matter of the anterior left frontal lobe. Patchy T2 hyperintensities elsewhere in the cerebral white matter bilaterally are unchanged from the prior MRI with greatest involvement of the periventricular regions. Gliosis from old left frontal lobe ventriculostomy catheter tract. No abnormal enhancement is identified. Old right medial orbital blowout fracture is again noted. Paranasal sinuses and mastoid air cells are clear. Major intracranial vascular flow voids are preserved. IMPRESSION: 1. No acute intracranial abnormality or mass. 2. Moderate cerebral white matter disease, unchanged and nonspecific but compatible with chronic small vessel ischemia with chronic lacunar infarcts as above. Electronically Signed   By: ALogan BoresM.D.   On: 07/05/2015 10:42   Dg Swallowing Func-speech Pathology  07/05/2015  Objective Swallowing Evaluation: Type of Study: MBS-Modified Barium Swallow Study Patient Details Name: Megan CRUMBLEMRN: [id number] of Birth: [birth year]Today's Date: 07/05/2015 Time: SLP Start Time (ACUTE ONLY): 1410-SLP Stop Time (ACUTE ONLY): 1500 SLP Time Calculation (min) (ACUTE ONLY): 50 min Past Medical History: Past Medical History Diagnosis Date . Migraine  . Lung abnormality  . Hypertension  . Pleurisy  . Cerebral aneurysm 2 brain surgeries 96 or 97 . Paget disease of bone  . Diarrhea  . Renal insufficiency    Patient states " no kidney problems . S/P biopsy    of throat per patient. . Oral thrush 09/24/2014 . Nicotine dependence 09/24/2014 . Chronic neck pain 09/24/2014 . [place] . [place] . [place]  . [facility]    malignant neoplasm  of unknown origin Past Surgical History: Past Surgical History  Procedure Laterality Date . Chest tube insertion   . Tubal ligation   . Cerebral aneurysm repair  96 or 97 . Colonoscopy     10 years ago in [place]  . Esophagogastroduodenoscopy  06/16/2015   Dr. Gala Romney: edentulous cricopharyngeus, esophageal plaques, biopsy consistent with candida . Laryngoscopy  Jun 22, 2015   [facility], Dr. Erik Obey: normal oropharynx, no lesions, mobile vocal cords with good airway. . Esophagogastroduodenoscopy N/A 06/16/2015   Procedure: ESOPHAGOGASTRODUODENOSCOPY (EGD);  Surgeon: Daneil Dolin, MD;  Location[facility];  Service: Endoscopy;  Laterality: N/A; HPI: 60 yo female with h/o Stage III lung cancer s/p chemoradiation, migraines, Paget disease of the bones, oral thrush, emphysema, dysphagia.  Pt reports sudden worsening of dysphagia in May 2017.  She admits to weight loss of 3 pounds due to her dysphagia.  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  Prior testing completed included esophagram with findings of frank aspiration, MBS August 2016, recommended dys2/honey thick with thin water between meals.  Pt has undergone flexible laryngoscopy showing normal oropharynx, no lesions, mobile vocal folds with good airway.  Pt denies pneumonias but admits to dysphagia as source of weight loss and lack of pleasure with po.   Subjective: "I can swallow applesauce but not solids" Assessment / Plan / Recommendation CHL IP CLINICAL IMPRESSIONS 07/05/2015 Therapy Diagnosis Mild oral phase dysphagia;Severe pharyngeal phase dysphagia;Severe cervical esophageal phase dysphagia Clinical Impression At baseline, this pt observed to frequently cough/expectorate secretions with intermittent wet phonation apparent.    Mild oral and severe pharyngo-cervical esophageal dysphagia.  Pt with poor hyolaryngeal motility resulting in gross pharyngeal residuals without consistent awareness.  Pharyngeal residuals were much worse with pudding than liquids - with only 10%  of any consistency entering into esophagus with first swallow.  Multiple swallows (x6-7) were not effective to clear any consistency and pt required expectoration to remove majority of vallecular residuals.   Mild amount of pyriform sinus liquid residuals were aspirated after the swallow as they spilled into an open larynx - (mixed with secretions).   Moderate aspiration of all (thin, nectar) liquids observed with inconsistent cough response.  Suspect cough elicited when aspirates reached deep into trachea = *cough not effective to clear due to depth of aspirates. Various postures including chin tuck, head turn left/right did not decrease resdiuals nor protect airway.     SLP questions contribution of radiation fibrosis to pt's severe dysphagia causing significant exacerbation of some baseline deficits *diagnosed before chemoradiation completed in Sept and November 2016.    Due to current level of dysphagia, pt is very high aspiration/malnutrition risk.  Pt reports "feeding tube" being mentioned to her and SLP suspects she will benefit from long term means of nutrition.  Using teach back and video monitor, pt was educated during and after testing to findings, recommendations.  Advised she keep lingual base strong to elicit adequate expectoration of secretions, melted ice.  Recommend pt have ice chips after oral care and frequently rinse/expectorate water for comfort/hygiene.  Follow up SLP indicated to determine if active therapeutic exercises *Shaker laryngeal elevation exercise* may be of benefit to pt.  Thanks for this referral.      Impact on safety and function Severe aspiration risk;Risk for inadequate nutrition/hydration   CHL IP TREATMENT RECOMMENDATION 07/05/2015 Treatment Recommendations Therapy as outlined in treatment plan below   Prognosis 07/05/2015 Prognosis for Safe Diet Advancement  Guarded Barriers to Reach Goals Time post onset;Severity of deficits Barriers/Prognosis Comment -- CHL IP DIET  RECOMMENDATION 07/05/2015 SLP Diet Recommendations Ice chips PRN after oral care- multiple swallows and frequent expectoration  Liquid Administration via -- Medication Administration Via alternative means Compensations -- Postural Changes --   CHL IP OTHER RECOMMENDATIONS 07/05/2015 Recommended Consults -- Oral Care Recommendations Oral care QID Other Recommendations Have oral suction available   CHL IP FOLLOW UP RECOMMENDATIONS 07/05/2015 Follow up Recommendations Outpatient SLP   CHL IP FREQUENCY AND DURATION 07/05/2015 Speech Therapy Frequency (ACUTE ONLY) min 2x/week Treatment Duration 1 week      CHL IP ORAL PHASE 07/05/2015 Oral Phase Impaired Oral - Pudding Teaspoon -- Oral - Pudding Cup -- Oral - Honey Teaspoon -- Oral - Honey Cup -- Oral - Nectar Teaspoon Weak lingual manipulation Oral - Nectar Cup Weak lingual manipulation Oral - Nectar Straw -- Oral - Thin Teaspoon Weak lingual manipulation Oral - Thin Cup Weak lingual manipulation Oral - Thin Straw -- Oral - Puree Weak lingual manipulation Oral - Mech Soft -- Oral - Regular -- Oral - Multi-Consistency -- Oral - Pill -- Oral Phase - Comment --  CHL IP PHARYNGEAL PHASE 07/05/2015 Pharyngeal Phase Impaired Pharyngeal- Pudding Teaspoon -- Pharyngeal -- Pharyngeal- Pudding Cup -- Pharyngeal -- Pharyngeal- Honey Teaspoon -- Pharyngeal -- Pharyngeal- Honey Cup -- Pharyngeal -- Pharyngeal- Nectar Teaspoon Reduced pharyngeal peristalsis;Reduced epiglottic inversion;Reduced anterior laryngeal mobility;Reduced laryngeal elevation;Reduced airway/laryngeal closure;Reduced tongue base retraction;Penetration/Aspiration during swallow;Penetration/Apiration after swallow;Moderate aspiration;Pharyngeal residue - valleculae;Pharyngeal residue - pyriform;Pharyngeal residue - cp segment Pharyngeal Material enters airway, passes BELOW cords and not ejected out despite cough attempt by patient;Material enters airway, passes BELOW cords without attempt by patient to eject out (silent  aspiration) Pharyngeal- Nectar Cup Reduced pharyngeal peristalsis;Reduced epiglottic inversion;Reduced anterior laryngeal mobility;Reduced laryngeal elevation;Reduced airway/laryngeal closure;Reduced tongue base retraction;Penetration/Aspiration during swallow;Penetration/Apiration after swallow;Moderate aspiration;Pharyngeal residue - valleculae;Pharyngeal residue - pyriform;Pharyngeal residue - cp segment Pharyngeal Material enters airway, passes BELOW cords and not ejected out despite cough attempt by patient;Material enters airway, passes BELOW cords without attempt by patient to eject out (silent aspiration) Pharyngeal- Nectar Straw -- Pharyngeal -- Pharyngeal- Thin Teaspoon Reduced pharyngeal peristalsis;Reduced epiglottic inversion;Reduced anterior laryngeal mobility;Reduced laryngeal elevation;Reduced airway/laryngeal closure;Reduced tongue base retraction;Moderate aspiration;Pharyngeal residue - valleculae;Pharyngeal residue - pyriform;Pharyngeal residue - cp segment Pharyngeal Material enters airway, passes BELOW cords and not ejected out despite cough attempt by patient;Material enters airway, passes BELOW cords without attempt by patient to eject out (silent aspiration) Pharyngeal- Thin Cup Reduced pharyngeal peristalsis;Reduced epiglottic inversion;Reduced anterior laryngeal mobility;Reduced laryngeal elevation;Reduced airway/laryngeal closure;Reduced tongue base retraction;Penetration/Aspiration during swallow;Penetration/Apiration after swallow;Moderate aspiration;Significant aspiration (Amount);Pharyngeal residue - valleculae;Pharyngeal residue - pyriform;Pharyngeal residue - cp segment Pharyngeal Material enters airway, passes BELOW cords and not ejected out despite cough attempt by patient;Material enters airway, passes BELOW cords without attempt by patient to eject out (silent aspiration) Pharyngeal- Thin Straw -- Pharyngeal -- Pharyngeal- Puree Reduced pharyngeal peristalsis;Reduced epiglottic  inversion;Reduced anterior laryngeal mobility;Reduced laryngeal elevation;Reduced airway/laryngeal closure;Reduced tongue base retraction;Pharyngeal residue - valleculae;Pharyngeal residue - pyriform;Pharyngeal residue - cp segment Pharyngeal -- Pharyngeal- Mechanical Soft -- Pharyngeal -- Pharyngeal- Regular -- Pharyngeal -- Pharyngeal- Multi-consistency -- Pharyngeal -- Pharyngeal- Pill -- Pharyngeal -- Pharyngeal Comment head turn right and left nor chin tuck helpful to decrease aspiration or residuals  CHL IP CERVICAL ESOPHAGEAL PHASE 07/05/2015 Cervical Esophageal Phase Impaired Pudding Teaspoon -- Pudding Cup -- Honey Teaspoon -- Honey Cup -- Nectar Teaspoon Other (Comment) Nectar Cup Other (Comment) Nectar Straw -- Thin Teaspoon  Other (Comment) Thin Cup Other (Comment) Thin Straw -- Puree Other (Comment) Mechanical Soft -- Regular -- Multi-consistency -- Pill -- Cervical Esophageal Comment minimal amount of barium enters into esophagus despite multiple postures.  Pt does not consistently sense residuals.   CHL IP GO 07/05/2015 Functional Assessment Tool Used clinical judgement, MBS Functional Limitations Swallowing Swallow Current Status (V7616) CM Swallow Goal Status ([id number]) CM Swallow Discharge Status (G6269) CM Luanna Salk, MS [facility] SLP [phone]               Medications: I have reviewed the patient's current medications.  Assesment:   Principal Problem:   Dehydration Active Problems:   Dysphagia   Bilateral lung cancer (HCC)   Candida esophagitis (HCC)    Plan:  Medications reviewed Continue IV hydrationd PEG placement as planned  LOS: 3 days   Megan Sims 07/06/2015, 1:50 PM

## 2015-07-07 LAB — GLUCOSE, CAPILLARY
GLUCOSE-CAPILLARY: 85 mg/dL (ref 65–99)
Glucose-Capillary: 125 mg/dL — ABNORMAL HIGH (ref 65–99)
Glucose-Capillary: 124 mg/dL — ABNORMAL HIGH (ref 65–99)

## 2015-07-07 MED ORDER — JEVITY 1.2 CAL PO LIQD
237.0000 mL | ORAL | Status: DC
  Administered 2015-07-07 – 2015-07-08 (×7): 237 mL
  Filled 2015-07-07: qty 237
  Filled 2015-07-07: qty 1000
  Filled 2015-07-07: qty 237
  Filled 2015-07-07: qty 1000
  Filled 2015-07-07: qty 237
  Filled 2015-07-07: qty 1000
  Filled 2015-07-07: qty 237
  Filled 2015-07-07 (×2): qty 1000
  Filled 2015-07-07 (×2): qty 237
  Filled 2015-07-07: qty 1000
  Filled 2015-07-07 (×2): qty 237
  Filled 2015-07-07 (×2): qty 1000
  Filled 2015-07-07: qty 237

## 2015-07-07 MED ORDER — ONDANSETRON HCL 4 MG/2ML IJ SOLN: 4.0000 mg | Freq: Four times a day (QID) | INTRAMUSCULAR | Status: DC | PRN

## 2015-07-07 MED ORDER — TRAMADOL HCL 50 MG PO TABS
50.0000 mg | ORAL_TABLET | Freq: Once | ORAL | Status: AC
  Administered 2015-07-07: 50 mg via ORAL

## 2015-07-07 NOTE — Progress Notes (Addendum)
Initial Nutrition Assessment  DOCUMENTATION CODES:   Not applicable  -very likely with AT LEAST mod malnutrition, however was unable to complete physical exam due to pt severe pain/nausea. Will assess on F/U  INTERVENTION:  Given pt's weight loss and poor intake,  Best that TF will meet 100% of patients needs for now. TF can be weaned if/when pt is able to consume significant energy from PO intake.   Addendum: Due to pt's history poor PO intake. Will start TF at 1 can q 3 hrs. If tolerates, can increase size of feed to- GOAL TF regimen: 6 cans of Jevity 1.2, split into four feeds of 1.5 cans each (~350 cc/feed) w/ 60 cc flush before and after each feed  Provides:1706 kcals, 79 g Pro, 1146 ml free water + 480 ml from flush  Diet per SLP/GI. Rd to contact SLP about med administration  RD to follow for tolerance. Pt is visibly anxious/overwhelmed and will likely need extensive instruction/education before she is able to be comfortable with this change to her life.   NUTRITION DIAGNOSIS:  Swallowing difficulty related to chronic dysphagia as evidenced by NPO status (SLP eval and high aspiration risk).  GOAL:  Patient will meet greater than or equal to 90% of their needs  MONITOR:  TF tolerance  REASON FOR ASSESSMENT:  Consult Enteral/tube feeding initiation and management  ASSESSMENT:  59 y/o female PMHx emphysema, bilateral lung cancer chronic dysphagia. Completed chemoradiation for bilateral lung cancer including radiation to the neck in November 2016. Pt's dysphagia has worsened. MBS on 6/12 shows pt at high risk of aspiration and long term alternate nutrition option was reccommended, now s/p PEG. RD consulted for TF recommendations  RD met with pt to discuss Bolus TF method, chosen formula, potential obstacles/complications and answer any questions.   Pt is obviously overwhelmed as she had numerous "what ifs". She does not believe she will even be able to open the tube feeding  cans on her own. Let her know that there is a device that can assist her. Her biggest concern was that she would not be able to tolerate 6 cans a day. Described ways to overcome this by decreasing amount of feed + increasing amount of feedings or changing the formula. Advised her stomach has likely decreased capacity due to "1 month no intake" and will re-expand with use. If she is still unable to tolerate bolus, RD talked about potential continues feed with pump, to which pt immediately responded "oh no, i cant do that".  She does not think she will be able to administer the cans herself, stating "I am too weak". She has been watching nursing use her tube and believes it is much too complicated for her. Advised nursing is using sterile water and she wont need to do this at home.  Unclear if she is allowed to take meds orally.   Assessment/interview was cut short as patient suddenly experienced severe pain/nausea and curled into the fetal position. Called nurse and turned lights off per pt request . RD to f/u to assess TF tolerance.  NFPE: Unable to conduct assessment  Spoke with Case worker and [facility] rep about pt's concerns. They both stated they will follow up.   UBW appears to be ~120 lbs. She has lost weight, but not to a clinically significant degree  Labs reviewed:   Recent Labs Lab 07/02/15 1355  NA 139  K 3.7  CL 104  CO2 27  BUN 15  CREATININE 0.97  CALCIUM  8.5*  GLUCOSE 82   Diet Order:  Diet NPO time specified  Skin:  VERY Dry, Flaky  Last BM:  6/10  Height:  Ht Readings from Last 1 Encounters:  07/02/15 5' 1"  (1.549 m)   Weight:  Wt Readings from Last 1 Encounters:  07/02/15 115 lb 11.2 oz (52.481 kg)   Wt Readings from Last 10 Encounters:  07/02/15 115 lb 11.2 oz (52.481 kg)  07/02/15 115 lb 3.2 oz (52.254 kg)  06/25/15 117 lb 6.4 oz (53.252 kg)  06/15/15 113 lb 12.8 oz (51.619 kg)  05/25/15 121 lb 8 oz (55.112 kg)  05/05/15 129 lb 8 oz (58.741 kg)  05/03/15  120 lb (54.432 kg)  04/10/15 124 lb 9.6 oz (56.518 kg)  02/18/15 124 lb (56.246 kg)  02/02/15 124 lb (56.246 kg)   Ideal Body Weight:  47.73 kg  BMI:  Body mass index is 21.87 kg/(m^2).  Estimated Nutritional Needs:  Kcal:  1600-1750 kcals (30-33 kcal/kg bw) Protein:  58-68 g pro (1.1-1.3 g/kg bw) Fluid:  1.6 liters (30 ml/kg)  EDUCATION NEEDS:  Education needs addressed  Burtis Junes RD, LDN, CNSC Clinical Nutrition Pager: [phone] 07/07/2015 11:16 AM

## 2015-07-07 NOTE — Op Note (Signed)
[facility] Patient Name: Megan Sims Procedure Date: 07/06/2015 2:43 PM MRN: [id number] Date of Birth: 1955-07-12 Attending MD: Barney Drain , MD CSN: [id number] Age: 60 Admit Type: Inpatient Procedure:                Upper GI endoscopy WITH COLD FORCEPS BIOPSY AND PEG                            PLACEMENT Indications:              Dysphagia Providers:                Barney Drain, MD, Otis Peak B. Sharon Seller, RN, Janeece Riggers, RN, Georgeann Oppenheim, Technician Referring MD:             Rosita Fire Medicines:                Meperidine 50 mg IV, Midazolam 4 mg IV, Ancef 2 GMS                            IV Complications:            No immediate complications. Estimated Blood Loss:     Estimated blood loss was minimal. Procedure:                Pre-Anesthesia Assessment:                           - Prior to the procedure, a History and Physical                            was performed, and patient medications and                            allergies were reviewed. The patient's tolerance of                            previous anesthesia was also reviewed. The risks                            and benefits of the procedure and the sedation                            options and risks were discussed with the patient.                            All questions were answered, and informed consent                            was obtained. Prior Anticoagulants: The patient has                            taken no previous anticoagulant or antiplatelet  agents. ASA Grade Assessment: II - A patient with                            mild systemic disease. After reviewing the risks                            and benefits, the patient was deemed in                            satisfactory condition to undergo the procedure.                            After obtaining informed consent, the endoscope was                            passed under direct vision.  Throughout the                            procedure, the patient's blood pressure, pulse, and                            oxygen saturations were monitored continuously. The                            was introduced through the mouth, and advanced to                            the second part of duodenum. After obtaining                            informed consent, the endoscope was passed under                            direct vision. Throughout the procedure, the                            patient's blood pressure, pulse, and oxygen                            saturations were monitored continuously.The upper                            GI endoscopy was accomplished without difficulty.                            The patient tolerated the procedure well.                           Skin numbed with lidocaine. INTRODUCER PLACED                            THROUGH ABDOMINAL WALL INTO GASTRIC LUMEN UNDER  DIRECT VISUALIZATION. PEG PLACED OVER GUIDEWIRE.                            SKIN AT 1.5 CM. TOP OF BUMPER AT 2.5 CM. Scope In: 2:57:28 PM Scope Out: 3:20:39 PM Total Procedure Duration: 0 hours 23 minutes 11 seconds  Findings:      One mild benign-appearing, intrinsic stenosis was found. And was       traversed.      Scattered mild inflammation characterized by congestion (edema) and       erythema was found in the gastric antrum. Biopsies were taken with a       cold forceps for Helicobacter pylori testing.      One non-bleeding cratered duodenal ulcer with no stigmata of bleeding       was found in the duodenal bulb.      Scattered mild inflammation characterized by congestion (edema),       erosions and erythema was found in the entire duodenum. Impression:               - PATENT PEPTIC STRICTURE                           - MILD Gastritis. Biopsied.                           - One non-bleeding duodenal ulcer with no stigmata                            of  bleeding.                           - MODERATE Duodenitis. Moderate Sedation:      Moderate (conscious) sedation was administered by the endoscopy nurse       and supervised by the endoscopist. The following parameters were       monitored: oxygen saturation, heart rate, blood pressure, and response       to care. Total physician intraservice time was 35 minutes. Recommendation:           - Resume previous diet PER SPEECH RECOMMENDATIONS                           - Continue present medications.                           - Please follow the post-PEG recommendations                            including: dry dressing only and clean site with                            soap and water daily and dry thoroughly.                           NO ASA/NSAIDS/ANTICOAGULATION FOR 5 DAYS.                           FAMILY WILL CALL WITH QUESTIONS OR CONCERMS-DERRICK  PINNIX [phone] OR JESSIE                            JAMES([phone].                           - Return patient to hospital ward for ongoing care. Procedure Code(s):        --- Professional ---                           [phone], Moderate sedation services provided by the                            same physician or other qualified health care                            professional performing the diagnostic or                            therapeutic service that the sedation supports,                            requiring the presence of an independent trained                            observer to assist in the monitoring of the                            patient's level of consciousness and physiological                            status; initial 15 minutes of intraservice time,                            patient age 18 years or older                           [phone], Moderate sedation services; each additional                            15 minutes intraservice time Diagnosis Code(s):        ---  Professional ---                           K22.2, Esophageal obstruction                           K29.70, Gastritis, unspecified, without bleeding                           K26.9, Duodenal ulcer, unspecified as acute or                            chronic, without hemorrhage or perforation  K29.80, Duodenitis without bleeding                           R13.10, Dysphagia, unspecified CPT copyright 2016 [employer]. All rights reserved. The codes documented in this report are preliminary and upon coder review may  be revised to meet current compliance requirements. Barney Drain, MD Barney Drain, MD 07/07/2015 1:38:20 PM This report has been signed electronically. Number of Addenda: 0

## 2015-07-07 NOTE — Progress Notes (Signed)
Subjective: Patient had PEG placement yesterday. Patient tolerated the procedure.. Objective: Vital signs in last 24 hours: Temp:  [97.8 F (36.6 C)-98.6 F (37 C)] 98.1 F (36.7 C) (06/14 0617) Pulse Rate:  [70-101] 98 (06/14 0617) Resp:  [13-21] 16 (06/14 0617) BP: (89-164)/(57-90) 144/61 mmHg (06/14 0617) SpO2:  [89 %-100 %] 96 % (06/14 0617) Weight change:  Last BM Date: 07/03/15  Intake/Output from previous day:    PHYSICAL EXAM General appearance: cachectic and no distress Resp: diminished breath sounds bilaterally and rhonchi bilaterally Cardio: S1, S2 normal GI: soft, non-tender; bowel sounds normal; no masses,  no organomegaly and PEG tube in place Extremities: extremities normal, atraumatic, no cyanosis or edema  Lab Results:  No results found for this or any previous visit (from the past 48 hour(s)).  ABGS No results for input(s): PHART, PO2ART, TCO2, HCO3 in the last 72 hours.  Invalid input(s): PCO2 CULTURES No results found for this or any previous visit (from the past 240 hour(s)). Studies/Results: Mr Kizzie Fantasia Contrast  07/05/2015  CLINICAL DATA:  Dysphagia for 2 weeks. Evaluation for stroke. History of lung cancer and cerebral aneurysm clipping. EXAM: MRI HEAD WITHOUT AND WITH CONTRAST TECHNIQUE: Multiplanar, multiecho pulse sequences of the brain and surrounding structures were obtained without and with intravenous contrast. CONTRAST:  32m MULTIHANCE GADOBENATE DIMEGLUMINE 529 MG/ML IV SOLN COMPARISON:  Head CT 06/14/2015 and MRI 10/02/2014 FINDINGS: Sequelae of prior pterional craniotomies are again identified with ICA region aneurysm clips present bilaterally resulting in regional susceptibility artifact. There is no evidence of acute infarct, intracranial hemorrhage, mass, midline shift, or extra-axial fluid collection. There is mild cerebral atrophy. Chronic lacunar infarcts are again seen in the left pons, right thalamus, and bilateral basal ganglia.  There is also chronic encephalomalacia in the anterior right greater than left temporal lobes and involving the subcortical white matter of the anterior left frontal lobe. Patchy T2 hyperintensities elsewhere in the cerebral white matter bilaterally are unchanged from the prior MRI with greatest involvement of the periventricular regions. Gliosis from old left frontal lobe ventriculostomy catheter tract. No abnormal enhancement is identified. Old right medial orbital blowout fracture is again noted. Paranasal sinuses and mastoid air cells are clear. Major intracranial vascular flow voids are preserved. IMPRESSION: 1. No acute intracranial abnormality or mass. 2. Moderate cerebral white matter disease, unchanged and nonspecific but compatible with chronic small vessel ischemia with chronic lacunar infarcts as above. Electronically Signed   By: ALogan BoresM.D.   On: 07/05/2015 10:42   Dg Swallowing Func-speech Pathology  07/05/2015  Objective Swallowing Evaluation: Type of Study: MBS-Modified Barium Swallow Study Patient Details Name: Megan MARCOTTEMRN: [id number] of Birth: 209/24/1957Today's Date: 07/05/2015 Time: SLP Start Time (ACUTE ONLY): 1410-SLP Stop Time (ACUTE ONLY): 1500 SLP Time Calculation (min) (ACUTE ONLY): 50 min Past Medical History: Past Medical History Diagnosis Date . Migraine  . Lung abnormality  . Hypertension  . Pleurisy  . Cerebral aneurysm 2 brain surgeries 96 or 97 . Paget disease of bone  . Diarrhea  . Renal insufficiency    Patient states " no kidney problems . S/P biopsy    of throat per patient. . Oral thrush 09/24/2014 . Nicotine dependence 09/24/2014 . Chronic neck pain 09/24/2014 . Skin ulcer ([facility] 11/18/2014 . Emphysema of lung ([facility] 05/05/2015 . [place] [place]  . Cancer ([facility]    malignant neoplasm of unknown origin Past Surgical History: Past Surgical History Procedure Laterality Date . Chest  tube insertion   . Tubal ligation   . Cerebral aneurysm repair  96  or 97 . Colonoscopy     10 years ago in [place]  . Esophagogastroduodenoscopy  06/16/2015   Dr. Gala Romney: edentulous cricopharyngeus, esophageal plaques, biopsy consistent with candida . Laryngoscopy  Jun 22, 2015   [facility], Dr. Erik Obey: normal oropharynx, no lesions, mobile vocal cords with good airway. . Esophagogastroduodenoscopy N/A 06/16/2015   Procedure: ESOPHAGOGASTRODUODENOSCOPY (EGD);  Surgeon: Daneil Dolin, MD;  Location: [facility];  Service: Endoscopy;  Laterality: N/A; HPI: 60 yo female with h/o Stage III lung cancer s/p chemoradiation, migraines, Paget disease of the bones, oral thrush, emphysema, dysphagia.  Pt reports sudden worsening of dysphagia in May 2017.  She admits to weight loss of 3 pounds due to her dysphagia.  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  Prior testing completed included esophagram with findings of frank aspiration, MBS August 2016, recommended dys2/honey thick with thin water between meals.  Pt has undergone flexible laryngoscopy showing normal oropharynx, no lesions, mobile vocal folds with good airway.  Pt denies pneumonias but admits to dysphagia as source of weight loss and lack of pleasure with po.   Subjective: "I can swallow applesauce but not solids" Assessment / Plan / Recommendation CHL IP CLINICAL IMPRESSIONS 07/05/2015 Therapy Diagnosis Mild oral phase dysphagia;Severe pharyngeal phase dysphagia;Severe cervical esophageal phase dysphagia Clinical Impression At baseline, this pt observed to frequently cough/expectorate secretions with intermittent wet phonation apparent.    Mild oral and severe pharyngo-cervical esophageal dysphagia.  Pt with poor hyolaryngeal motility resulting in gross pharyngeal residuals without consistent awareness.  Pharyngeal residuals were much worse with pudding than liquids - with only 10% of any consistency entering into esophagus with first swallow.  Multiple swallows (x6-7) were not  effective to clear any consistency and pt required expectoration to remove majority of vallecular residuals.   Mild amount of pyriform sinus liquid residuals were aspirated after the swallow as they spilled into an open larynx - (mixed with secretions).   Moderate aspiration of all (thin, nectar) liquids observed with inconsistent cough response.  Suspect cough elicited when aspirates reached deep into trachea = *cough not effective to clear due to depth of aspirates. Various postures including chin tuck, head turn left/right did not decrease resdiuals nor protect airway.     SLP questions contribution of radiation fibrosis to pt's severe dysphagia causing significant exacerbation of some baseline deficits *diagnosed before chemoradiation completed in Sept and November 2016.    Due to current level of dysphagia, pt is very high aspiration/malnutrition risk.  Pt reports "feeding tube" being mentioned to her and SLP suspects she will benefit from long term means of nutrition.  Using teach back and video monitor, pt was educated during and after testing to findings, recommendations.  Advised she keep lingual base strong to elicit adequate expectoration of secretions, melted ice.  Recommend pt have ice chips after oral care and frequently rinse/expectorate water for comfort/hygiene.  Follow up SLP indicated to determine if active therapeutic exercises *Shaker laryngeal elevation exercise* may be of benefit to pt.  Thanks for this referral.      Impact on safety and function Severe aspiration risk;Risk for inadequate nutrition/hydration   CHL IP TREATMENT RECOMMENDATION 07/05/2015 Treatment Recommendations Therapy as outlined in treatment plan below   Prognosis 07/05/2015 Prognosis for Safe Diet Advancement Guarded Barriers to Reach Goals Time post onset;Severity of deficits Barriers/Prognosis Comment -- CHL  IP DIET RECOMMENDATION 07/05/2015 SLP Diet Recommendations Ice chips PRN after oral care- multiple swallows and  frequent expectoration  Liquid Administration via -- Medication Administration Via alternative means Compensations -- Postural Changes --   CHL IP OTHER RECOMMENDATIONS 07/05/2015 Recommended Consults -- Oral Care Recommendations Oral care QID Other Recommendations Have oral suction available   CHL IP FOLLOW UP RECOMMENDATIONS 07/05/2015 Follow up Recommendations Outpatient SLP   CHL IP FREQUENCY AND DURATION 07/05/2015 Speech Therapy Frequency (ACUTE ONLY) min 2x/week Treatment Duration 1 week      CHL IP ORAL PHASE 07/05/2015 Oral Phase Impaired Oral - Pudding Teaspoon -- Oral - Pudding Cup -- Oral - Honey Teaspoon -- Oral - Honey Cup -- Oral - Nectar Teaspoon Weak lingual manipulation Oral - Nectar Cup Weak lingual manipulation Oral - Nectar Straw -- Oral - Thin Teaspoon Weak lingual manipulation Oral - Thin Cup Weak lingual manipulation Oral - Thin Straw -- Oral - Puree Weak lingual manipulation Oral - Mech Soft -- Oral - Regular -- Oral - Multi-Consistency -- Oral - Pill -- Oral Phase - Comment --  CHL IP PHARYNGEAL PHASE 07/05/2015 Pharyngeal Phase Impaired Pharyngeal- Pudding Teaspoon -- Pharyngeal -- Pharyngeal- Pudding Cup -- Pharyngeal -- Pharyngeal- Honey Teaspoon -- Pharyngeal -- Pharyngeal- Honey Cup -- Pharyngeal -- Pharyngeal- Nectar Teaspoon Reduced pharyngeal peristalsis;Reduced epiglottic inversion;Reduced anterior laryngeal mobility;Reduced laryngeal elevation;Reduced airway/laryngeal closure;Reduced tongue base retraction;Penetration/Aspiration during swallow;Penetration/Apiration after swallow;Moderate aspiration;Pharyngeal residue - valleculae;Pharyngeal residue - pyriform;Pharyngeal residue - cp segment Pharyngeal Material enters airway, passes BELOW cords and not ejected out despite cough attempt by patient;Material enters airway, passes BELOW cords without attempt by patient to eject out (silent aspiration) Pharyngeal- Nectar Cup Reduced pharyngeal peristalsis;Reduced epiglottic inversion;Reduced  anterior laryngeal mobility;Reduced laryngeal elevation;Reduced airway/laryngeal closure;Reduced tongue base retraction;Penetration/Aspiration during swallow;Penetration/Apiration after swallow;Moderate aspiration;Pharyngeal residue - valleculae;Pharyngeal residue - pyriform;Pharyngeal residue - cp segment Pharyngeal Material enters airway, passes BELOW cords and not ejected out despite cough attempt by patient;Material enters airway, passes BELOW cords without attempt by patient to eject out (silent aspiration) Pharyngeal- Nectar Straw -- Pharyngeal -- Pharyngeal- Thin Teaspoon Reduced pharyngeal peristalsis;Reduced epiglottic inversion;Reduced anterior laryngeal mobility;Reduced laryngeal elevation;Reduced airway/laryngeal closure;Reduced tongue base retraction;Moderate aspiration;Pharyngeal residue - valleculae;Pharyngeal residue - pyriform;Pharyngeal residue - cp segment Pharyngeal Material enters airway, passes BELOW cords and not ejected out despite cough attempt by patient;Material enters airway, passes BELOW cords without attempt by patient to eject out (silent aspiration) Pharyngeal- Thin Cup Reduced pharyngeal peristalsis;Reduced epiglottic inversion;Reduced anterior laryngeal mobility;Reduced laryngeal elevation;Reduced airway/laryngeal closure;Reduced tongue base retraction;Penetration/Aspiration during swallow;Penetration/Apiration after swallow;Moderate aspiration;Significant aspiration (Amount);Pharyngeal residue - valleculae;Pharyngeal residue - pyriform;Pharyngeal residue - cp segment Pharyngeal Material enters airway, passes BELOW cords and not ejected out despite cough attempt by patient;Material enters airway, passes BELOW cords without attempt by patient to eject out (silent aspiration) Pharyngeal- Thin Straw -- Pharyngeal -- Pharyngeal- Puree Reduced pharyngeal peristalsis;Reduced epiglottic inversion;Reduced anterior laryngeal mobility;Reduced laryngeal elevation;Reduced airway/laryngeal  closure;Reduced tongue base retraction;Pharyngeal residue - valleculae;Pharyngeal residue - pyriform;Pharyngeal residue - cp segment Pharyngeal -- Pharyngeal- Mechanical Soft -- Pharyngeal -- Pharyngeal- Regular -- Pharyngeal -- Pharyngeal- Multi-consistency -- Pharyngeal -- Pharyngeal- Pill -- Pharyngeal -- Pharyngeal Comment head turn right and left nor chin tuck helpful to decrease aspiration or residuals  CHL IP CERVICAL ESOPHAGEAL PHASE 07/05/2015 Cervical Esophageal Phase Impaired Pudding Teaspoon -- Pudding Cup -- Honey Teaspoon -- Honey Cup -- Nectar Teaspoon Other (Comment) Nectar Cup Other (Comment) Nectar Straw -- Thin Teaspoon Other (Comment) Thin Cup Other (Comment) Thin Straw -- Puree Other (Comment) Mechanical Soft --  Regular -- Multi-consistency -- Pill -- Cervical Esophageal Comment minimal amount of barium enters into esophagus despite multiple postures.  Pt does not consistently sense residuals.   CHL IP GO 07/05/2015 Functional Assessment Tool Used clinical judgement, MBS Functional Limitations Swallowing Swallow Current Status (P2244) CM Swallow Goal Status (L7530) CM Swallow Discharge Status (Y5110) CM Megan Salk, MS [facility] SLP [phone]               Medications: I have reviewed the patient's current medications.  Assesment:   Principal Problem:   Dehydration Active Problems:   Dysphagia   Bilateral lung cancer (HCC)   Candida esophagitis (HCC)    Plan:  Medications reviewed PEG tube feeding as plannedd  LOS: 4 days   Gladiola Madore 07/07/2015, 8:17 AM

## 2015-07-07 NOTE — Progress Notes (Signed)
Physical Therapy Treatment Patient Details Name: Megan Sims MRN: [id number] DOB: [birth year] Today's Date: 07/07/2015    History of Present Illness 60 yo F admitted with worsening of chronic dysphagia, weight loss, and poor oral intake. Dehydration, and Candida esophagitis, gastric and duodenal erosions. Possible CVA? 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    PT Comments    Pt received in bed, multiple family members present, and pt was agreeable to PT evaluation.  Pt expressed discomfort after having PEG tube placed yesterday.  Pt only able to ambulate 28f with RW due to increased abdominal pain.  Educated pt on using the log roll to decrease discomfort with bed level transfers.  Continue to recommend HHPT vs OPPT pending trasportation.   Follow Up Recommendations  Home health PT;Outpatient PT     Equipment Recommendations  Other (comment) (quad cane vs RW pending pt's progress. )    Recommendations for Other Services       Precautions / Restrictions Precautions Precautions: Fall Precaution Comments: PEG Restrictions Weight Bearing Restrictions: No    Mobility  Bed Mobility Overal bed mobility: Needs Assistance Bed Mobility: Rolling;Sidelying to Sit;Sit to Sidelying Rolling: Supervision Sidelying to sit: Min assist     Sit to sidelying: Min assist General bed mobility comments: vc's for log roll technique.  Handout given.   Transfers Overall transfer level: Needs assistance Equipment used: Rolling walker (2 wheeled) Transfers: Sit to/from Stand Sit to Stand: Min guard;Min assist         General transfer comment: Min A required from lower toilet surface.   Ambulation/Gait Ambulation/Gait assistance: Min guard Ambulation Distance (Feet): 20 Feet Assistive  device: Rolling walker (2 wheeled) Gait Pattern/deviations: Step-through pattern;Trunk flexed     General Gait Details: Decreased cadence and pt requesting to stop due to increased abdominal pain from PEG site.    Stairs            Wheelchair Mobility    Modified Rankin (Stroke Patients Only)       Balance Overall balance assessment: Needs assistance         Standing balance support: Bilateral upper extremity supported Standing balance-Leahy Scale: Fair                      Cognition Arousal/Alertness: Awake/alert Behavior During Therapy: WFL for tasks assessed/performed Overall Cognitive Status: Within Functional Limits for tasks assessed                      Exercises      General Comments        Pertinent Vitals/Pain Pain Assessment:  (Pt states that she is waiting for her pain pill, but wiling to work with PT.  Does not give a number for pain. )    Home Living                      Prior Function            PT Goals (current goals can now be found in the care plan section) Acute Rehab PT Goals Patient Stated Goal: To get stronger and go home.  PT Goal Formulation: With patient/family Time For Goal Achievement: 07/12/15 Potential to Achieve Goals: Fair Progress towards PT goals: Progressing toward goals    Frequency  Min 3X/week  PT Plan Current plan remains appropriate    Co-evaluation             End of Session Equipment Utilized During Treatment: Gait belt Activity Tolerance: Patient tolerated treatment well Patient left: with call bell/phone within reach;with family/visitor present;in bed     Time: 8338-2505 PT Time Calculation (min) (ACUTE ONLY): 23 min  Charges:  $Therapeutic Activity: 23-37 mins                    G Codes:      Beth Levada Bowersox, PT, DPT X: [phone]

## 2015-07-07 NOTE — Progress Notes (Signed)
Patient complains of nausea. Dr. Legrand Rams notified. New orders for Zofran 4 mg every 6 hours as needed.

## 2015-07-07 NOTE — Care Management Note (Signed)
Case Management Note  Patient Details  Name: Megan Sims MRN: [id number] Date of Birth: 09/14/1955  Subjective/Objective:                  Pt is from home, lives with her husband and son and is ind with ADL's. Pt continues to have difficult swallowing and PEG tube has been placed. Pt will need HH RN and SLP, going home with TF. Pt also needs BSC and Walker. Pt has chosen [facility] from list of agencies to provide both [facility] and DME. Romualdo Bolk, at [facility] aware of referral and will obtain pt info from chart and deliver DME prior to DC.   Action/Plan: Will cont to follow and make arrangements for needs at DC.   Expected Discharge Date:    07/08/2015              Expected Discharge Plan:  [place]  In-House Referral:  NA, Nutrition  Discharge planning Services  CM Consult  Post Acute Care Choice:  Durable Medical Equipment, Home Health Choice offered to:  Patient  DME Arranged:  Bedside commode, Walker DME Agency:  [place] Arranged:  RN, Speech Therapy [place] Agency:  [place]  Status of Service:  In process, will continue to follow  Medicare Important Message Given:    Date Medicare IM Given:    Medicare IM give by:    Date Additional Medicare IM Given:    Additional Medicare Important Message give by:     If discussed at [place] of Stay Meetings, dates discussed:    Additional Comments:  Sherald Barge, RN 07/07/2015, 12:51 PM

## 2015-07-08 ENCOUNTER — Encounter (HOSPITAL_COMMUNITY): Payer: Self-pay | Admitting: Gastroenterology

## 2015-07-08 LAB — GLUCOSE, CAPILLARY
GLUCOSE-CAPILLARY: 100 mg/dL — AB (ref 65–99)
GLUCOSE-CAPILLARY: 111 mg/dL — AB (ref 65–99)
GLUCOSE-CAPILLARY: 102 mg/dL — AB (ref 65–99)
Glucose-Capillary: 113 mg/dL — ABNORMAL HIGH (ref 65–99)

## 2015-07-08 MED ORDER — TRAMADOL HCL 50 MG PO TABS: 50.0000 mg | ORAL_TABLET | Freq: Four times a day (QID) | ORAL | Status: DC | PRN

## 2015-07-08 MED ORDER — JEVITY 1.2 CAL PO LIQD: 237.0000 mL | ORAL | Status: DC

## 2015-07-08 NOTE — Discharge Summary (Signed)
Physician Discharge Summary  Patient ID: Megan Sims MRN: [id number] DOB/AGE: 1955/12/25 60 y.o. Primary Care Physician:Kenetha Cozza, MD Admit date: 07/02/2015 Discharge date: 07/08/2015    Discharge Diagnoses:     Principal Problem:   Dehydration Active Problems:   Dysphagia   Bilateral lung cancer ([place])   Candida esophagitis (HCC)     Medication List    STOP taking these medications        fluconazole 200 MG tablet  Commonly known as:  DIFLUCAN      TAKE these medications        amLODipine 10 MG tablet  Commonly known as:  NORVASC  Take 1 tablet (10 mg total) by mouth daily.     chlorpheniramine-HYDROcodone 10-8 MG/5ML Suer  Commonly known as:  TUSSIONEX  Take 5 mLs by mouth every 12 (twelve) hours as needed for cough. Reported on 07/02/2015     feeding supplement (JEVITY 1.2 CAL) Liqd  Place 237 mLs into feeding tube every 3 (three) hours.     hydrOXYzine 25 MG tablet  Commonly known as:  ATARAX/VISTARIL  Take 1 tablet (25 mg total) by mouth 3 (three) times daily.     sucralfate 1 GM/10ML suspension  Commonly known as:  CARAFATE  Take 10 mLs (1 g total) by mouth 4 (four) times daily -  with meals and at bedtime.     traMADol 50 MG tablet  Commonly known as:  ULTRAM  Take 1 tablet (50 mg total) by mouth every 6 (six) hours as needed (pain).     zolpidem 10 MG tablet  Commonly known as:  AMBIEN  Take 1 tablet (10 mg total) by mouth at bedtime as needed for sleep.        Discharged Condition: improved    Consults: GI  Significant Diagnostic Studies: Dg Chest 2 View  06/14/2015  CLINICAL DATA:  Acute onset of difficulty swallowing and vomiting. Sore throat. Initial encounter. EXAM: CHEST  2 VIEW COMPARISON:  Chest radiograph performed 05/24/2015 FINDINGS: The lungs are well-aerated. Patchy right midlung airspace opacities raise concern for pneumonia. This is new from the recent prior study. There is no evidence of pleural effusion or pneumothorax. The  heart is normal in size; the mediastinal contour is within normal limits. A left-sided chest port is noted ending about the mid SVC. No acute osseous abnormalities are seen. IMPRESSION: Patchy right-sided airspace opacities, new from the recent prior study, raise concern for pneumonia. Electronically Signed   By: Garald Balding M.D.   On: 06/14/2015 18:32   Ct Head Wo Contrast  06/14/2015  CLINICAL DATA:  Initial evaluation for acute difficulty swelling. EXAM: CT HEAD WITHOUT CONTRAST TECHNIQUE: Contiguous axial images were obtained from the base of the skull through the vertex without intravenous contrast. COMPARISON:  Prior studies from 06/14/2015. FINDINGS: Postoperative changes from prior bilateral craniotomies again seen. 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 Encephalomalacia of within the anterior right temporal pole. Changes are stable. Atrophy with chronic microvascular ischemic disease. Remote lacunar infarct within the left caudate head. Additional remote lacunar infarct within the right basal ganglia. No acute intracranial hemorrhage. No definite acute large vessel territory infarct, although evaluation somewhat limited by streak artifact from aneurysm clips. No mass lesion, midline shift, or mass effect. No hydrocephalus. No extra-axial fluid collection. Scalp soft tissues within normal limits. No acute abnormality about the orbits. Paranasal sinuses are clear. Remote defect at the right lamina papyracea noted. No mastoid effusion. No acute abnormality about the calvarium.  Post craniotomy changes. IMPRESSION: 1. No acute intracranial process. 2. Stable postoperative changes, generalized cerebral atrophy, and chronic small vessel ischemic disease. 3. 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 Electronically Signed   By: Jeannine Boga M.D.   On: 06/14/2015 21:12   Ct Soft Tissue Neck W Contrast  06/14/2015  CLINICAL DATA:  60 year old female with  history of lung cancer. Difficulty swallowing and sore throat for the past week and a half. Subsequent encounter. EXAM: CT NECK WITH CONTRAST TECHNIQUE: Multidetector CT imaging of the neck was performed using the standard protocol following the bolus administration of intravenous contrast. CONTRAST:  15m ISOVUE-300 IOPAMIDOL (ISOVUE-300) INJECTION 61% COMPARISON:  06/14/2015 chest x-ray. CT of the chest performed same date is dictated separately. FINDINGS: Pharynx and larynx: Symmetric mild prominence of palatine tonsils may reflect result of mild inflammation. No deep drainable abscess noted. Salivary glands: No mass or inflammation noted. Thyroid: No mass identified. Lymph nodes: Scattered normal to top-normal size lymph nodes. Vascular: Bilateral carotid bifurcation calcifications with hemodynamically significant stenosis suspected but incompletely assessed. Limited intracranial: Negative. Visualized orbits: Not imaged. Mastoids and visualized paranasal sinuses: Portions visualized clear. Skeleton: No osseous destructive lesion. Mild cervical spondylotic changes. Upper chest: No apical mass. Please see chest CT report dictated separately. IMPRESSION: Symmetric mild prominence of palatine tonsils may reflect result of mild inflammation. Bilateral carotid bifurcation calcifications with hemodynamically significant stenosis suspected but incompletely assessed. Chest CT report dictated separately. Electronically Signed   By: SGenia DelM.D.   On: 06/14/2015 20:07   Ct Chest Wo Contrast  06/14/2015  CLINICAL DATA:  Abnormal chest radiograph. Assess pneumonia. Personal history of lung cancer. Initial encounter. EXAM: CT CHEST WITHOUT CONTRAST TECHNIQUE: Multidetector CT imaging of the chest was performed following the standard protocol without IV contrast. COMPARISON:  CTA of the chest performed 05/03/2015, and chest radiograph performed earlier today at 6:23 p.m. FINDINGS: Mild focal opacities along the right  minor fissure and at the right upper lobe are similar in appearance to prior CTA, though less hazy, but are significantly more prominent on chest radiograph. This may be projectional on chest radiograph, or may reflect interval redistribution of atypical infection superimposed on chronic scarring. The left lung appears relatively clear. No pleural effusion or pneumothorax is seen. No dominant mass is seen to suggest recurrent malignancy. Diffuse coronary artery calcifications are seen. Trace pericardial fluid remains within normal limits. No mediastinal lymphadenopathy is seen. No pericardial effusion is identified. Scattered calcification is noted along the aortic arch and proximal great vessels. A left-sided chest port is noted ending about the mid SVC. The visualized portions of the thyroid gland are unremarkable. No axillary lymphadenopathy is seen. The visualized portions of the liver are unremarkable. The spleen is diminutive. The visualized portions of the gallbladder, pancreas, adrenal glands and kidneys are within normal limits. No acute osseous abnormalities are identified. 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 IMPRESSION: 1. Small focal opacities along the right minor fissure and at the right upper lobe are similar in appearance to the prior CTA, though less hazy. However, these are significantly more prominent on the corresponding chest radiograph. This may be projectional on chest radiograph, or may reflect interval redistribution of atypical infection, superimposed on chronic scarring. 2. No definite evidence for recurrence of malignancy. 3. Diffuse coronary artery calcifications seen. 4. 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 Electronically Signed   By: JGarald BaldingM.D.   On: 06/14/2015  20:01   Mr Jeri Cos IZ Contrast  07/05/2015  CLINICAL DATA:  Dysphagia for 2 weeks. Evaluation for stroke. History of lung cancer and cerebral aneurysm clipping. EXAM: MRI HEAD  WITHOUT AND WITH CONTRAST TECHNIQUE: Multiplanar, multiecho pulse sequences of the brain and surrounding structures were obtained without and with intravenous contrast. CONTRAST:  47m MULTIHANCE GADOBENATE DIMEGLUMINE 529 MG/ML IV SOLN COMPARISON:  Head CT 06/14/2015 and MRI 10/02/2014 FINDINGS: Sequelae of prior pterional craniotomies are again identified with ICA region aneurysm clips present bilaterally resulting in regional susceptibility artifact. There is no evidence of acute infarct, intracranial hemorrhage, mass, midline shift, or extra-axial fluid collection. There is mild cerebral atrophy. Chronic lacunar infarcts are again seen in the left pons, right thalamus, and bilateral basal ganglia. There is also chronic encephalomalacia in the anterior right greater than left temporal lobes and involving the subcortical white matter of the anterior left frontal lobe. Patchy T2 hyperintensities elsewhere in the cerebral white matter bilaterally are unchanged from the prior MRI with greatest involvement of the periventricular regions. Gliosis from old left frontal lobe ventriculostomy catheter tract. No abnormal enhancement is identified. Old right medial orbital blowout fracture is again noted. Paranasal sinuses and mastoid air cells are clear. Major intracranial vascular flow voids are preserved. IMPRESSION: 1. No acute intracranial abnormality or mass. 2. Moderate cerebral white matter disease, unchanged and nonspecific but compatible with chronic small vessel ischemia with chronic lacunar infarcts as above. Electronically Signed   By: ALogan BoresM.D.   On: 07/05/2015 10:42   Dg Esophagus  06/30/2015  CLINICAL DATA:  Dysphagia, weight loss, history of lung carcinoma EXAM: ESOPHOGRAM/BARIUM SWALLOW TECHNIQUE: Single contrast examination was performed using  thin barium. FLUOROSCOPY TIME:  Radiation Exposure Index (as provided by the fluoroscopic device): 7.5 deciGy per square cm If the device does not provide  the exposure index: Fluoroscopy Time:  18 seconds of fluoroscopy time Number of Acquired Images: COMPARISON:  Barium swallow of 09/16/2014 FINDINGS: After reviewing the prior barium swallow from 09/16/2014, the study was begun in the lateral projection. The patient swallowed barium and considerable residual barium is noted within the vallecular and piriform sinuses. The patient then demonstrated frank aspiration with some cough response. In view of this frank aspiration, the remainder of the esophagus could not be evaluated. No gross abnormality is evident on the single swallow obtained. IMPRESSION: Frank aspiration with cough response. Electronically Signed   By: PIvar DrapeM.D.   On: 06/30/2015 11:36   Dg Swallowing Func-speech Pathology  07/05/2015  Objective Swallowing Evaluation: Type of Study: MBS-Modified Barium Swallow Study Patient Details Name: Megan STEPPEMRN: [id number] of Birth: 2Oct 02, 1957Today's Date: 07/05/2015 Time: SLP Start Time (ACUTE ONLY): 1410-SLP Stop Time (ACUTE ONLY): 1500 SLP Time Calculation (min) (ACUTE ONLY): 50 min Past Medical History: Past Medical History Diagnosis Date . Migraine  . Lung abnormality  . Hypertension  . Pleurisy  . Cerebral aneurysm 2 brain surgeries 96 or 97 . Paget disease of bone  . Diarrhea  . Renal insufficiency    Patient states " no kidney problems . S/P biopsy    of throat per patient. . Oral thrush 09/24/2014 . Nicotine dependence 09/24/2014 . Chronic neck pain 09/24/2014 . [place] 11/18/2014 . Emphysema of lung ([facility] 05/05/2015 . Mal[place]  . Cancer ([facility]    malignant neoplasm of unknown origin Past Surgical History: Past Surgical History Procedure Laterality Date . Chest tube insertion   . Tubal  ligation   . Cerebral aneurysm repair  96 or 97 . Colonoscopy     10 years ago in [place]  . Esophagogastroduodenoscopy  06/16/2015   Dr. Gala Romney: edentulous cricopharyngeus, esophageal plaques, biopsy consistent with candida .  Laryngoscopy  Jun 22, 2015   [facility], Dr. Erik Obey: normal oropharynx, no lesions, mobile vocal cords with good airway. . Esophagogastroduodenoscopy N/A 06/16/2015   Procedure: ESOPHAGOGASTRODUODENOSCOPY (EGD);  Surgeon: Daneil Dolin, MD;  Location: [facility];  Service: Endoscopy;  Laterality: N/A; HPI: 60 yo female with h/o Stage III lung cancer s/p chemoradiation, migraines, Paget disease of the bones, oral thrush, emphysema, dysphagia.  Pt reports sudden worsening of dysphagia in May 2017.  She admits to weight loss of 3 pounds due to her dysphagia.  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  Prior testing completed included esophagram with findings of frank aspiration, MBS August 2016, recommended dys2/honey thick with thin water between meals.  Pt has undergone flexible laryngoscopy showing normal oropharynx, no lesions, mobile vocal folds with good airway.  Pt denies pneumonias but admits to dysphagia as source of weight loss and lack of pleasure with po.   Subjective: "I can swallow applesauce but not solids" Assessment / Plan / Recommendation CHL IP CLINICAL IMPRESSIONS 07/05/2015 Therapy Diagnosis Mild oral phase dysphagia;Severe pharyngeal phase dysphagia;Severe cervical esophageal phase dysphagia Clinical Impression At baseline, this pt observed to frequently cough/expectorate secretions with intermittent wet phonation apparent.    Mild oral and severe pharyngo-cervical esophageal dysphagia.  Pt with poor hyolaryngeal motility resulting in gross pharyngeal residuals without consistent awareness.  Pharyngeal residuals were much worse with pudding than liquids - with only 10% of any consistency entering into esophagus with first swallow.  Multiple swallows (x6-7) were not effective to clear any consistency and pt required expectoration to remove majority of vallecular residuals.   Mild amount of pyriform sinus liquid residuals were aspirated after the  swallow as they spilled into an open larynx - (mixed with secretions).   Moderate aspiration of all (thin, nectar) liquids observed with inconsistent cough response.  Suspect cough elicited when aspirates reached deep into trachea = *cough not effective to clear due to depth of aspirates. Various postures including chin tuck, head turn left/right did not decrease resdiuals nor protect airway.     SLP questions contribution of radiation fibrosis to pt's severe dysphagia causing significant exacerbation of some baseline deficits *diagnosed before chemoradiation completed in Sept and November 2016.    Due to current level of dysphagia, pt is very high aspiration/malnutrition risk.  Pt reports "feeding tube" being mentioned to her and SLP suspects she will benefit from long term means of nutrition.  Using teach back and video monitor, pt was educated during and after testing to findings, recommendations.  Advised she keep lingual base strong to elicit adequate expectoration of secretions, melted ice.  Recommend pt have ice chips after oral care and frequently rinse/expectorate water for comfort/hygiene.  Follow up SLP indicated to determine if active therapeutic exercises *Shaker laryngeal elevation exercise* may be of benefit to pt.  Thanks for this referral.      Impact on safety and function Severe aspiration risk;Risk for inadequate nutrition/hydration   CHL IP TREATMENT RECOMMENDATION 07/05/2015 Treatment Recommendations Therapy as outlined in treatment plan below   Prognosis 07/05/2015 Prognosis for Safe Diet Advancement Guarded Barriers to Reach Goals Time post onset;Severity of deficits Barriers/Prognosis Comment -- CHL IP DIET RECOMMENDATION 07/05/2015 SLP Diet  Recommendations Ice chips PRN after oral care- multiple swallows and frequent expectoration  Liquid Administration via -- Medication Administration Via alternative means Compensations -- Postural Changes --   CHL IP OTHER RECOMMENDATIONS 07/05/2015  Recommended Consults -- Oral Care Recommendations Oral care QID Other Recommendations Have oral suction available   CHL IP FOLLOW UP RECOMMENDATIONS 07/05/2015 Follow up Recommendations Outpatient SLP   CHL IP FREQUENCY AND DURATION 07/05/2015 Speech Therapy Frequency (ACUTE ONLY) min 2x/week Treatment Duration 1 week      CHL IP ORAL PHASE 07/05/2015 Oral Phase Impaired Oral - Pudding Teaspoon -- Oral - Pudding Cup -- Oral - Honey Teaspoon -- Oral - Honey Cup -- Oral - Nectar Teaspoon Weak lingual manipulation Oral - Nectar Cup Weak lingual manipulation Oral - Nectar Straw -- Oral - Thin Teaspoon Weak lingual manipulation Oral - Thin Cup Weak lingual manipulation Oral - Thin Straw -- Oral - Puree Weak lingual manipulation Oral - Mech Soft -- Oral - Regular -- Oral - Multi-Consistency -- Oral - Pill -- Oral Phase - Comment --  CHL IP PHARYNGEAL PHASE 07/05/2015 Pharyngeal Phase Impaired Pharyngeal- Pudding Teaspoon -- Pharyngeal -- Pharyngeal- Pudding Cup -- Pharyngeal -- Pharyngeal- Honey Teaspoon -- Pharyngeal -- Pharyngeal- Honey Cup -- Pharyngeal -- Pharyngeal- Nectar Teaspoon Reduced pharyngeal peristalsis;Reduced epiglottic inversion;Reduced anterior laryngeal mobility;Reduced laryngeal elevation;Reduced airway/laryngeal closure;Reduced tongue base retraction;Penetration/Aspiration during swallow;Penetration/Apiration after swallow;Moderate aspiration;Pharyngeal residue - valleculae;Pharyngeal residue - pyriform;Pharyngeal residue - cp segment Pharyngeal Material enters airway, passes BELOW cords and not ejected out despite cough attempt by patient;Material enters airway, passes BELOW cords without attempt by patient to eject out (silent aspiration) Pharyngeal- Nectar Cup Reduced pharyngeal peristalsis;Reduced epiglottic inversion;Reduced anterior laryngeal mobility;Reduced laryngeal elevation;Reduced airway/laryngeal closure;Reduced tongue base retraction;Penetration/Aspiration during  swallow;Penetration/Apiration after swallow;Moderate aspiration;Pharyngeal residue - valleculae;Pharyngeal residue - pyriform;Pharyngeal residue - cp segment Pharyngeal Material enters airway, passes BELOW cords and not ejected out despite cough attempt by patient;Material enters airway, passes BELOW cords without attempt by patient to eject out (silent aspiration) Pharyngeal- Nectar Straw -- Pharyngeal -- Pharyngeal- Thin Teaspoon Reduced pharyngeal peristalsis;Reduced epiglottic inversion;Reduced anterior laryngeal mobility;Reduced laryngeal elevation;Reduced airway/laryngeal closure;Reduced tongue base retraction;Moderate aspiration;Pharyngeal residue - valleculae;Pharyngeal residue - pyriform;Pharyngeal residue - cp segment Pharyngeal Material enters airway, passes BELOW cords and not ejected out despite cough attempt by patient;Material enters airway, passes BELOW cords without attempt by patient to eject out (silent aspiration) Pharyngeal- Thin Cup Reduced pharyngeal peristalsis;Reduced epiglottic inversion;Reduced anterior laryngeal mobility;Reduced laryngeal elevation;Reduced airway/laryngeal closure;Reduced tongue base retraction;Penetration/Aspiration during swallow;Penetration/Apiration after swallow;Moderate aspiration;Significant aspiration (Amount);Pharyngeal residue - valleculae;Pharyngeal residue - pyriform;Pharyngeal residue - cp segment Pharyngeal Material enters airway, passes BELOW cords and not ejected out despite cough attempt by patient;Material enters airway, passes BELOW cords without attempt by patient to eject out (silent aspiration) Pharyngeal- Thin Straw -- Pharyngeal -- Pharyngeal- Puree Reduced pharyngeal peristalsis;Reduced epiglottic inversion;Reduced anterior laryngeal mobility;Reduced laryngeal elevation;Reduced airway/laryngeal closure;Reduced tongue base retraction;Pharyngeal residue - valleculae;Pharyngeal residue - pyriform;Pharyngeal residue - cp segment Pharyngeal --  Pharyngeal- Mechanical Soft -- Pharyngeal -- Pharyngeal- Regular -- Pharyngeal -- Pharyngeal- Multi-consistency -- Pharyngeal -- Pharyngeal- Pill -- Pharyngeal -- Pharyngeal Comment head turn right and left nor chin tuck helpful to decrease aspiration or residuals  CHL IP CERVICAL ESOPHAGEAL PHASE 07/05/2015 Cervical Esophageal Phase Impaired Pudding Teaspoon -- Pudding Cup -- Honey Teaspoon -- Honey Cup -- Nectar Teaspoon Other (Comment) Nectar Cup Other (Comment) Nectar Straw -- Thin Teaspoon Other (Comment) Thin Cup Other (Comment) Thin Straw -- Puree Other (Comment) Mechanical Soft -- Regular -- Multi-consistency -- Pill --  Cervical Esophageal Comment minimal amount of barium enters into esophagus despite multiple postures.  Pt does not consistently sense residuals.   CHL IP GO 07/05/2015 Functional Assessment Tool Used clinical judgement, MBS Functional Limitations Swallowing Swallow Current Status (R4935) CM Swallow Goal Status (L2174) CM Swallow Discharge Status (J1595) CM Luanna Salk, MS [facility] SLP [phone]               Lab Results: Basic Metabolic Panel: No results for input(s): NA, K, CL, CO2, GLUCOSE, BUN, CREATININE, CALCIUM, MG, PHOS in the last 72 hours. Liver Function Tests: No results for input(s): AST, ALT, ALKPHOS, BILITOT, PROT, ALBUMIN in the last 72 hours.   CBC: No results for input(s): WBC, NEUTROABS, HGB, HCT, MCV, PLT in the last 72 hours.  No results found for this or any previous visit (from the past 240 hour(s)).   Hospital Course:   This is a 61 years old female with history of multiple medical illnesses including Ca of the lung was admitted due to dysphagia. Patient was was evaluated by GI and speech therapist and she was found to have oropharyngeal dysphagia with aspiration.  After discussing with patient and her family members PEG tube was inserted and patient is started on bolus feeding. She is now tolerating bolus feeding. Patient will be discharged on bolus feeding.  Home health will arranged.  Discharge Exam: Blood pressure 139/70, pulse 104, temperature 99 F (37.2 C), temperature source Oral, resp. rate 18, height '5\' 1"'$  (1.549 m), weight 53.3 kg (117 lb 8.1 oz), SpO2 92 %.    Disposition:  home      Signed: Lisha Vitale   07/08/2015, 8:00 AM

## 2015-07-08 NOTE — Progress Notes (Signed)
Speech Language Pathology Treatment: Dysphagia  Patient Details Name: Megan Sims MRN: [id number] DOB: 12-Sep-1955 Today's Date: 07/08/2015 Time:  - 1:23 PM    Assessment / Plan / Recommendation Clinical Impression  SLP spoke with pt prior to discharge home. PEG placed. Reiterated continued NPO with AMON via PEG; Ok for ice chips after oral care as wanted. Continue SLP dysphagia intervention via home health and/or outpatient. Pt will hopefully be able to tolerate some pleasure feeds at some point after SLP intervention.   HPI HPI: 60 yo female with h/o Stage III lung cancer s/p chemoradiation, migraines, Paget disease of the bones, oral thrush, emphysema, dysphagia.  Pt reports sudden worsening of dysphagia in May 2017.  She admits to weight loss of 3 pounds due to her dysphagia.  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  Prior testing completed included esophagram with findings of frank aspiration, MBS August 2016, recommended dys2/honey thick with thin water between meals.  Pt has undergone flexible laryngoscopy showing normal oropharynx, no lesions, mobile vocal folds with good airway.  Pt denies pneumonias but admits to dysphagia as source of weight loss and lack of pleasure with po.        SLP Plan   Homehealth/OUT Patient     Recommendations   NPO with AMON (PEG); OK for ice chips after oral care                   Thank you,  Genene Churn, [place]                [place] 07/08/2015, 2:03 PM

## 2015-07-08 NOTE — Progress Notes (Signed)
Patient with orders to be discharge home with home health. Discharge instructions given, patient verbalized understanding. Prescriptions given. Patient educated on peg tube feedings. Patient demonstrated understanding of peg tube feedings. Patient stable. Patient left in private vehicle with family.

## 2015-07-08 NOTE — Care Management Note (Signed)
Case Management Note  Patient Details  Name: Megan Sims MRN: [id number] Date of Birth: 03/17/1955  Expected Discharge Date:    07/08/2015              Expected Discharge Plan:  [place]  In-House Referral:  NA, Nutrition  Discharge planning Services  CM Consult  Post Acute Care Choice:  Durable Medical Equipment, Home Health Choice offered to:  Patient  DME Arranged:  Bedside commode, Walker DME Agency:  [place] Arranged:  RN, Speech Therapy [place] Agency:  [place]  Status of Service:  Completed, signed off  Medicare Important Message Given:    Date Medicare IM Given:    Medicare IM give by:    Date Additional Medicare IM Given:    Additional Medicare Important Message give by:     If discussed at [place] of Stay Meetings, dates discussed:  07/08/2015  Additional Comments: Pt discharging home today with [facility] services through [facility]. Romualdo Bolk, of [facility], aware of DC today. Pt's walker and BSC have been delivered. Plan for RN to make first visit today for education on TF at home.   Sherald Barge, RN 07/08/2015, 1:36 PM

## 2015-07-08 NOTE — Progress Notes (Signed)
Patient seen briefly prior to discharge. PEG external bumper loosened by 1cm, top of bumper at 3.5cm. Patient with redness, swelling under the eyes and of the mouth, c/w angioedema. She has had this issue intermittently without known etiology to date.   In close follow up with Dr. Erik Obey at [facility]. On May 30 she had a flexible laryngoscopy done while in the office, showing normal oropharynx, no lesions, mobile vocal cords with good airway. Considering cervical esophagoscopy and direct laryngoscopy. Previous neck CT at [facility] on 06/14/2015 with symmetric mild prominence of the palatine tonsils, bilateral carotid bifurcation calcifications with hemodynamically significant stenosis suspected but incompletely assessed.  I spoke personally with Dr. Legrand Rams via the phone regarding findings on exam today. He did not notice periorbital edema/lymphedema when he examined the patient earlier today. He will address issue.  Laureen Ochs. Bernarda Caffey [facility] [phone] 6/15/201711:07 AM

## 2015-07-09 ENCOUNTER — Telehealth: Payer: Self-pay | Admitting: *Deleted

## 2015-07-09 NOTE — Telephone Encounter (Signed)
CALLED PATIENT'S SON DEREK PINNIX AND TOLD HIM TO HAVE HIS MOM- (Megan Sims)  CALL [facility] AND THEY WILL ARRANGE HER AN APPT. - I GAVE MR. PENNIX THE PHONE NUMBER - [phone] AND HE SAID THAT HE WOULD MAKE HER AN APPT.

## 2015-07-12 ENCOUNTER — Encounter: Payer: Medicaid Other | Admitting: Nutrition

## 2015-07-14 ENCOUNTER — Telehealth: Payer: Self-pay | Admitting: Physical Therapy

## 2015-07-14 ENCOUNTER — Encounter: Payer: Medicaid Other | Admitting: Nutrition

## 2015-07-14 ENCOUNTER — Ambulatory Visit: Payer: Medicaid Other | Attending: Radiation Oncology | Admitting: Physical Therapy

## 2015-07-14 DIAGNOSIS — R262 Difficulty in walking, not elsewhere classified: Secondary | ICD-10-CM | POA: Diagnosis not present

## 2015-07-14 DIAGNOSIS — M6281 Muscle weakness (generalized): Secondary | ICD-10-CM | POA: Diagnosis present

## 2015-07-14 NOTE — Telephone Encounter (Signed)
Contacted pt to discuss Medicaid authorization process.  Her son is currently trying to get and appointment with [facility] at Speech Therapy.  She would like to use her authorized visits for Speech and go to the [facility] for PT.  Therefore, I will not send the authorizaion to Medicaid for PT at this point.  If speech is denied, then I may try to get authorization for PT.    Maudry Diego, PT 07/14/2015@ 2:14 PM

## 2015-07-14 NOTE — Therapy (Signed)
[place], [place], [address] Phone: [phone]   Fax:  [phone]  Physical Therapy Evaluation  Patient Details  Name: Megan Sims MRN: [id number] Date of Birth: 17-Jun-1955 Referring Provider: Isidore Moos   Encounter Date: 07/14/2015      PT End of Session - 07/14/15 1306    Visit Number 1   Number of Visits 4   Date for PT Re-Evaluation 09/13/15   Activity Tolerance Patient tolerated treatment well   Behavior During Therapy [facility] for tasks assessed/performed      Past Medical History  Diagnosis Date  . Migraine   . Lung abnormality   . Hypertension   . Pleurisy   . Cerebral aneurysm 2 brain surgeries 96 or 97  . Paget disease of bone   . Diarrhea   . Renal insufficiency     Patient states " no kidney problems  . S/P biopsy     of throat per patient.  . Oral thrush 09/24/2014  . Nicotine dependence 09/24/2014  . Chronic neck pain 09/24/2014  . Skin ulcer ([place]) 11/18/2014  . Emphysema of lung ([place]) 05/05/2015  . Malignant neoplasm of unknown origin ([place])   . Cancer [facility])     malignant neoplasm of unknown origin    Past Surgical History  Procedure Laterality Date  . Chest tube insertion    . Tubal ligation    . Cerebral aneurysm repair  96 or 97  . Colonoscopy      10 years ago in [place]   . Esophagogastroduodenoscopy  06/16/2015    Dr. Gala Romney: edentulous cricopharyngeus, esophageal plaques, biopsy consistent with candida  . Laryngoscopy  Jun 22, 2015    [facility], Dr. Erik Obey: normal oropharynx, no lesions, mobile vocal cords with good airway.  . Esophagogastroduodenoscopy N/A 06/16/2015    Procedure: ESOPHAGOGASTRODUODENOSCOPY (EGD);  Surgeon: Daneil Dolin, MD;  Location[facility];  Service: Endoscopy;  Laterality: N/A;  . Peg placement N/A 07/06/2015    Procedure: PERCUTANEOUS ENDOSCOPIC GASTROSTOMY (PEG) PLACEMENT;  Surgeon: Danie Binder, MD;  Location: [facility];  Service:  Endoscopy;  Laterality: N/A;  . Esophagogastroduodenoscopy N/A 07/06/2015    Procedure: ESOPHAGOGASTRODUODENOSCOPY (EGD);  Surgeon: Danie Binder, MD;  Location: [facility];  Service: Endoscopy;  Laterality: N/A;    There were no vitals filed for this visit.       Subjective Assessment - 07/14/15 1113    Subjective "Its my knee, my knee is giving me trouble"    Patient is accompained by: Family member  son, DJ   Pertinent History Pagets disease that started getting worse treatment for lund cancer.  She has had chemo and radiation for her lung cancer. Pt now had a feeding tube from unknown cause.    Limitations Walking;Standing;House hold activities   How long can you stand comfortably? 5-6   How long can you walk comfortably? 50    Patient Stated Goals to get legs stronger    Currently in Pain? Yes   Pain Score 9    Pain Location Knee   Pain Orientation Left   Pain Descriptors / Indicators Aching   Pain Type Chronic pain   Pain Onset More than a month ago   Pain Frequency Constant   Aggravating Factors  certain times of day sitting too long    Pain Relieving Factors medicne helps a little    Effect of Pain on Daily Activities lmiit what she can do  [facility] PT Assessment - 07/14/15 0001    Assessment   Medical Diagnosis lung cancer,    Referring Provider Isidore Moos    Onset Date/Surgical Date 07/14/15   Precautions   Precautions Fall   Restrictions   Weight Bearing Restrictions No   Balance Screen   Has the patient fallen in the past 6 months Yes   How many times? 1   Has the patient had a decrease in activity level because of a fear of falling?  Yes   Is the patient reluctant to leave their home because of a fear of falling?  Yes   [place] residence   Living Arrangements Spouse/significant other;Children   Available Help at Discharge Available PRN/intermittently   Type of [place] to enter    Entrance Stairs-Number of Steps 4   Entrance Stairs-Rails Right   [place] One level   Prior Function   Level of [place] with basic ADLs  occasionally uses a walker out of doors. house is too small    Vocation Unemployed   Cognition   Overall Cognitive Status Within Functional Limits for tasks assessed   Observation/Other Assessments   Observations Pt has redness and puffiness aournd face.  pt has feeding tube in place    Sensation   Light Touch Appears Intact   Coordination   Gross Motor Movements are Fluid and Coordinated No   Sit to Stand   Comments pt is able to do sit to stand x 5 repetitons from an elevated surface, but has difficulty when soming up from  standard chair height    Posture/Postural Control   Posture/Postural Control Postural limitations   Postural Limitations Rounded Shoulders;Forward head   ROM / Strength   AROM / PROM / Strength PROM   PROM   Overall PROM Comments lower extremity PROM is WNL    Strength   Overall Strength Comments Pt is thin with diffuse muscle atrophy  She has obivious LE weakness impairing  functional movments    Right Hip Flexion 3-/5   Right Hip Extension 2/5   Right Hip ABduction 2/5   Left Hip Flexion 1/5   Left Hip Extension 2/5   Left Hip ABduction 1/5   Right Knee Flexion 3+/5   Right Knee Extension 4-/5   Left Knee Flexion 3/5   Left Knee Extension 3+/5   Right Ankle Dorsiflexion 4/5   Left Ankle Dorsiflexion 3/5   Bed Mobility   Rolling Right 6: Modified independent (Device/Increase time)   Rolling Left 6: Modified independent (Device/Increase time)   Right Sidelying to Sit 6: Modified independent (Device/Increase time)   Sit to Supine 4: Min guard  pt has to lift her legs up with hands to get them to mat    Ambulation/Gait   Gait Comments pt states that she needs help from her family to walk from her house to the car. she reports she fatigues easily                    [facility] Adult PT  Treatment/Exercise - 07/14/15 0001    Knee/Hip Exercises: Standing   Hip Extension Stengthening;Both;1 set   Extension Limitations glute setes x 5 reps    Other Standing Knee Exercises forward and lateral weight shift.    Other Standing Knee Exercises sit to stand from higher chair  PT Education - 07/14/15 1304    Education provided Yes   Education Details home exercise for hip strength and for knee and ankle active range of motion   Person(s) Educated Patient;Child(ren)   Methods Explanation;Demonstration   Comprehension Verbalized understanding;Returned demonstration                Long Term Clinic Goals - 07/14/15 1315    CC Long Term Goal  #1   Title Patient will report a decrease in pain by 50% so they can perform daily activities with greater ease   Baseline 9/10   Period --  authorization period    Status New   CC Long Term Goal  #2   Title Patient will be independent in a home exercise program   Baseline no knowledge    Period --  authorizarion period    Status New   CC Long Term Goal  #3   Title Pt reports that she can walk from her house to the car with no assist.    Baseline pt states she now needs assist to walk    Period --  authorizaion period    Status New            Plan - 07/14/15 1307    Clinical Impression Statement Pt presents with muscle weakness primarily aound hips and swallowing problems requiring a feeding tube that has gotten worse since her cancer treatment.  She wants to have a speech therapy consult also for help with swallowing.  She undertands she may only have 3 total therapy visits and thinks she may want to use them for speech therapy.  She was given a flyer from the [employer] at [place] which serves underinsured people.  add episode if  medicaid approves and pt wants to come for PT    Rehab Potential Fair   Clinical Impairments Affecting Rehab Potential lung cancer, need for feeding  tube, pagets disease    PT Frequency --  3 treatments per authorization period   PT Treatment/Interventions Patient/family education;Functional mobility training;Therapeutic activities;Therapeutic exercise;Gait training;Balance training;Stair training   PT Next Visit Plan strengthening to both hips in supine and sidelying with education about HEP, gait training    PT Home Exercise Plan standing weight shift, glute sets, mini squats    Consulted and Agree with Plan of Care Patient;Family member/caregiver   Family Member Consulted son, DJ      Patient will benefit from skilled therapeutic intervention in order to improve the following deficits and impairments:  Abnormal gait, Difficulty walking, Decreased endurance, Decreased activity tolerance, Pain, Decreased mobility, Decreased strength  Visit Diagnosis: Difficulty in walking, not elsewhere classified  Muscle weakness (generalized)     Problem List Patient Active Problem List   Diagnosis Date Noted  . Dehydration 07/02/2015  . Candida esophagitis ([place])   . Anemia due to other cause   . Mucosal abnormality of stomach   . Pill esophagitis due to tetracycline 05/24/2015  . UTI (lower urinary tract infection) 05/24/2015  . Emphysema of lung ([place]) 05/05/2015  . Rash, skin 05/05/2015  . Severe sepsis ([place]) 04/10/2015  . AKI (acute kidney injury) ([place]) 04/10/2015  . Hyponatremia 04/10/2015  . Sepsis due to pneumonia ([place])   . CAP (community acquired pneumonia) 04/09/2015  . Skin ulcer ([place]) 11/18/2014  . Musculoskeletal chest pain 10/25/2014  . Pain in the chest   . Tachycardia   . Anemia due to antineoplastic chemotherapy 10/21/2014  . Cancer of upper  lobe of right lung ([place]) 10/07/2014  . Oral thrush 09/24/2014  . Nicotine dependence 09/24/2014  . Chronic neck pain 09/24/2014  . Excessive weight loss 09/24/2014  . 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 Bilateral lung cancer ([place])   . Paget's disease of bone   .  Dysphagia 09/05/2014  . Essential hypertension 09/05/2014   Donato Heinz. Owens Shark PT  Norwood Levo 07/14/2015, 1:27 PM  [place], [place], [address] Phone: [phone]   Fax:  [phone]  Name: Megan Sims MRN: [id number] Date of Birth: 1955/06/10

## 2015-07-19 ENCOUNTER — Telehealth: Payer: Self-pay | Admitting: *Deleted

## 2015-07-19 NOTE — Telephone Encounter (Signed)
Just see PCP as scheduled for now

## 2015-07-19 NOTE — Telephone Encounter (Signed)
Received call from [facility] nurse w/ [facility].  She reports pts HR today 120 and asks if this has been normal for pt? She reports no other symptoms, BP stable.  Pt has appt to see new PCP,  Dr. Maudie Mercury,  tomorrow.  Reviewed VS flowsheet and informed Kim pt's HR was in the 120s on her recent visit to GI MD.  Since pt is seeing PCP tomorrow and she has no symptoms,  Pt to keep appt as scheduled w/ PCP tomorrow and see Dr. Alvy Bimler on 7/10.   Please call us back if any changes.

## 2015-07-21 ENCOUNTER — Other Ambulatory Visit (HOSPITAL_COMMUNITY): Payer: Self-pay | Admitting: Internal Medicine

## 2015-07-21 ENCOUNTER — Telehealth: Payer: Self-pay | Admitting: Gastroenterology

## 2015-07-21 DIAGNOSIS — R922 Inconclusive mammogram: Secondary | ICD-10-CM

## 2015-07-21 NOTE — Telephone Encounter (Signed)
I called pt and she said she is doing fine. The tubing gets stopped up sometimes is the only problem that she is having at this time. She has a Emergency planning/management officer that comes out once a week to check on it. I told her to let the nurse know about that. She will call me when she talks to the nurse.  She would also like to know if Dr. Oneida Alar would like her to come for an office visit for follow up.

## 2015-07-21 NOTE — Telephone Encounter (Signed)
PATIENT SON, DERRICK, CALLED ABOUT HIS MOTHERS FEEDING TUBE FOLLOW UP APPT    [phone]

## 2015-07-21 NOTE — Telephone Encounter (Signed)
PATIENT CALLED INQUIRING IF THEY NEEDED A FOLLOW UP APPOINTMENT FOR A FEEDING TUBE SLF PUT IN

## 2015-07-21 NOTE — Telephone Encounter (Signed)
Pt's son is just concerned about his mom, said she would just like an appt to come in and discuss and get some answers on how long she will have to have the feeding tube.  I told him I has spoke to his mom and she said the tubing was getting clogged and she was gong to cal the nurse that comes out there and let me know what the nurse said.  He said he will check that out. He said he was at home with her yesterday and watched her and the tubing did not get clogged up.   He is aware I have sent a message to Dr. Oneida Alar.

## 2015-07-22 ENCOUNTER — Other Ambulatory Visit (HOSPITAL_COMMUNITY): Payer: Self-pay | Admitting: Internal Medicine

## 2015-07-22 ENCOUNTER — Telehealth: Payer: Self-pay | Admitting: *Deleted

## 2015-07-22 DIAGNOSIS — N6001 Solitary cyst of right breast: Secondary | ICD-10-CM

## 2015-07-22 DIAGNOSIS — N6002 Solitary cyst of left breast: Principal | ICD-10-CM

## 2015-07-22 NOTE — Telephone Encounter (Signed)
PT IS A NUR PT. SHE NEEDS TO LET us KNOW IF SHE IS FOLLOWING WITH DR. Olevia Perches OR OUR OFFICE.

## 2015-07-22 NOTE — Telephone Encounter (Signed)
I called and told Megan Sims and he is holding for Megan Sims to schedule the appt.

## 2015-07-22 NOTE — Telephone Encounter (Signed)
Derick, the pt's son, returned the phone call. He said he takes care of things for his mom. He was not aware that pt was Dr. Olevia Perches pt. He said he was not aware that pt is Dr. Olevia Perches pt. They would like to stay with Dr. Oneida Alar if possible.  He also said they spoke with the Home Health Nurse and everything is OK.  He is aware I will check with DR. Fields and see when she advises a follow up appt.

## 2015-07-22 NOTE — Telephone Encounter (Signed)
CALLED PATIENT'S SON - DERRICK PENNIX TO INFORM OF APPT. FOR HIS MOM ON 08-24-15  @ 1 PM WITH PA BRENDA SANDRIDGE @ [facility], LVM FOR A RETURN CALL

## 2015-07-22 NOTE — Telephone Encounter (Signed)
LMOM for a return call.  

## 2015-07-22 NOTE — Telephone Encounter (Signed)
PT was scheduled to see Dr. Oneida Alar on 09/02/2015 at 8:30 AM, and she told Montine Circle that she will have her on a call list if Dr. Oneida Alar has any cancellations.

## 2015-07-22 NOTE — Telephone Encounter (Signed)
REVIEWED-NO ADDITIONAL RECOMMENDATIONS. 

## 2015-07-22 NOTE — Telephone Encounter (Signed)
PLEASE CALL PT. IT IS OK TO MAKE PT AN RGA PT W/ Delayni Streed.

## 2015-07-23 ENCOUNTER — Ambulatory Visit: Payer: Medicaid Other | Admitting: Nutrition

## 2015-07-23 NOTE — Progress Notes (Signed)
Nutrition follow-up completed with patient who has completed treatment for lung cancer Patient had recent recent hospital admission and exhibited signs of dysphasia. Patient received feeding tube and is maintaining nutrition on Jevity 1.2 - 6 cans a day providing 1710 cal, 79 g protein, 1146 mL free water. Patient denies nausea, vomiting, constipation or diarrhea. Patient wants to begin eating again and would like a follow-up with speech therapy Patient has had slight weight gain on tube feeding.  Estimated nutrition needs: 1600-1800 calories, 70-80 grams protein, 1.8 L fluid.  Nutrition diagnosis:Unintended weight loss improved.  Intervention: Patient was educated to continue current tube feedings utilizing Jevity 1.2 - 6 cans daily providing greater than 90% of estimated nutrition needs. Referred patient back to speech therapy for further diet recommendations Questions were answered.  Teach back method used.  Monitoring, evaluation, goals: Patient is tolerating current tube feeding promoting weight gain.  Nutrition diagnosis resolved.  **Disclaimer: This note was dictated with voice recognition software. Similar sounding words can inadvertently be transcribed and this note may contain transcription errors which may not have been corrected upon publication of note.**

## 2015-07-27 ENCOUNTER — Encounter (HOSPITAL_COMMUNITY): Payer: Self-pay | Admitting: *Deleted

## 2015-07-27 ENCOUNTER — Emergency Department (HOSPITAL_COMMUNITY): Payer: Medicaid Other

## 2015-07-27 ENCOUNTER — Emergency Department (HOSPITAL_COMMUNITY)
Admission: EM | Admit: 2015-07-27 | Discharge: 2015-07-27 | Disposition: A | Discharge status: Home / Self Care | Payer: Medicaid Other | Attending: Emergency Medicine | Admitting: Emergency Medicine

## 2015-07-27 DIAGNOSIS — Z79899 Other long term (current) drug therapy: Secondary | ICD-10-CM | POA: Diagnosis not present

## 2015-07-27 DIAGNOSIS — M79605 Pain in left leg: Secondary | ICD-10-CM | POA: Diagnosis not present

## 2015-07-27 DIAGNOSIS — Z87891 Personal history of nicotine dependence: Secondary | ICD-10-CM | POA: Insufficient documentation

## 2015-07-27 DIAGNOSIS — I10 Essential (primary) hypertension: Secondary | ICD-10-CM | POA: Diagnosis not present

## 2015-07-27 HISTORY — DX: Pain in left knee: M25.562

## 2015-07-27 HISTORY — DX: Other symptoms and signs involving the musculoskeletal system: R29.898

## 2015-07-27 MED ORDER — HYDROCODONE-ACETAMINOPHEN 7.5-325 MG/15ML PO SOLN: 15.0000 mL | Freq: Four times a day (QID) | ORAL | Status: DC | PRN

## 2015-07-27 MED ORDER — MORPHINE SULFATE (PF) 4 MG/ML IV SOLN
4.0000 mg | Freq: Once | INTRAVENOUS | Status: AC
  Administered 2015-07-27: 4 mg via INTRAMUSCULAR
  Filled 2015-07-27: qty 1

## 2015-07-27 MED ORDER — OXYCODONE-ACETAMINOPHEN 5-325 MG PO TABS
2.0000 | ORAL_TABLET | Freq: Once | ORAL | Status: DC
  Filled 2015-07-27: qty 2

## 2015-07-27 MED ORDER — FENTANYL CITRATE (PF) 100 MCG/2ML IJ SOLN
50.0000 ug | Freq: Once | INTRAMUSCULAR | Status: AC
  Administered 2015-07-27: 50 ug via INTRAMUSCULAR
  Filled 2015-07-27: qty 2

## 2015-07-27 NOTE — Discharge Instructions (Signed)
Take the new prescription as directed.  Apply moist heat or ice to the area(s) of discomfort, for 15 minutes at a time, several times per day for the next few days.  Do not fall asleep on a heating or ice pack.  Return to the hospital in the morning to obtain an outpatient ultrasound of your leg.  This ultrasound will check to make sure you do not have a "blood clot" in the veins in your leg.  You will receive the results of your testing shortly after having it completed.  If there is a blood clot in your leg, you will be re-directed to the Emergency Department for further evaluation.  Call your regular medical doctor tomorrow to schedule a follow up appointment in the next 2 days.  Return to the Emergency Department immediately if worsening.

## 2015-07-27 NOTE — ED Notes (Signed)
Pt c/o left leg pain x one month with worsening of symptoms over the last few days; pt is a cancer pt and is taking chemotherapy; pt is scheduled for chemotherapy on July 10

## 2015-07-27 NOTE — ED Notes (Signed)
Informed pt of ultrasound appt tommorow at 1015

## 2015-07-27 NOTE — ED Provider Notes (Signed)
CSN: [id number]     Arrival date & time 07/27/15  1941 History   First MD Initiated Contact with Patient 07/27/15 1959     Chief Complaint  Patient presents with  . Leg Pain     HPI Pt was seen at 2000. Per pt and her family, c/o gradual onset and persistence of constant LLE "pain" for the past 1+ month. Pt endorses hx of left leg, esp knee, pain "due to the cancer" and she is currently in physical therapy for LLE weakness. Pt states she has been evaluated by her PMD for same, rx tramadol without improvement. Denies any change in her usual chronic pain, no fevers, no rash, no swelling, no specific area of point tenderness, no injury, no abd pain, no back pain, no new focal motor weakness, no tingling/numbness in extremities.   Past Medical History  Diagnosis Date  . Migraine   . Lung abnormality   . Hypertension   . Pleurisy   . Cerebral aneurysm 2 brain surgeries 96 or 97  . Paget disease of bone   . Diarrhea   . Renal insufficiency     Patient states " no kidney problems  . S/P biopsy     of throat per patient.  . Oral thrush 09/24/2014  . Nicotine dependence 09/24/2014  . Chronic neck pain 09/24/2014  . Skin ulcer ([place]) 11/18/2014  . Emphysema of lung ([place]) 05/05/2015  . Malignant neoplasm of unknown origin ([place])   . Cancer [facility])     malignant neoplasm of unknown origin  . Left knee pain   . Left leg weakness 06/2015   Past Surgical History  Procedure Laterality Date  . Chest tube insertion    . Tubal ligation    . Cerebral aneurysm repair  96 or 97  . Colonoscopy      10 years ago in [place]   . Esophagogastroduodenoscopy  06/16/2015    Dr. Gala Romney: edentulous cricopharyngeus, esophageal plaques, biopsy consistent with candida  . Laryngoscopy  Jun 22, 2015    [facility], Dr. Erik Obey: normal oropharynx, no lesions, mobile vocal cords with good airway.  . Esophagogastroduodenoscopy N/A 06/16/2015    Procedure: ESOPHAGOGASTRODUODENOSCOPY (EGD);  Surgeon: Daneil Dolin, MD;   [facility];  Service: Endoscopy;  Laterality: N/A;  . Peg placement N/A 07/06/2015    Procedure: PERCUTANEOUS ENDOSCOPIC GASTROSTOMY (PEG) PLACEMENT;  Surgeon: Danie Binder, MD;  [facility];  Service: Endoscopy;  Laterality: N/A;  . Esophagogastroduodenoscopy N/A 07/06/2015    Procedure: ESOPHAGOGASTRODUODENOSCOPY (EGD);  Surgeon: Danie Binder, MD;  [facility];  Service: Endoscopy;  Laterality: N/A;   Family History  Problem Relation Age of Onset  . Hypertension    . Diabetes    . Kidney disease    . Cancer Mother     throat ca  . Cancer Maternal Grandmother     thyroid ca  . Colon cancer Neg Hx    Social History  Substance Use Topics  . Smoking status: Former Smoker -- 0.50 packs/day for 34 years    Types: Cigarettes  . Smokeless tobacco: Never Used     Comment: HOWEVER, will smoke 1 cigarette if "tragic" event (death), or if she needs it for her nerves   . Alcohol Use: No   OB History    Gravida Para Term Preterm AB TAB SAB Ectopic Multiple Living            3     Review of  Systems ROS: Statement: All systems negative except as marked or noted in the HPI; Constitutional: Negative for fever and chills. ; ; Eyes: Negative for eye pain, redness and discharge. ; ; ENMT: Negative for ear pain, hoarseness, nasal congestion, sinus pressure and sore throat. ; ; Cardiovascular: Negative for chest pain, palpitations, diaphoresis, dyspnea and peripheral edema. ; ; Respiratory: Negative for cough, wheezing and stridor. ; ; Gastrointestinal: Negative for nausea, vomiting, diarrhea, abdominal pain, blood in stool, hematemesis, jaundice and rectal bleeding. . ; ; Genitourinary: Negative for dysuria, flank pain and hematuria. ; ; Musculoskeletal: +chronic LLE pain. Negative for back pain and neck pain. Negative for swelling and trauma.; ; Skin: Negative for pruritus, rash, abrasions, blisters, bruising and skin lesion.; ; Neuro: Negative for headache,  lightheadedness and neck stiffness. Negative for weakness, altered level of consciousness, altered mental status, extremity weakness, paresthesias, involuntary movement, seizure and syncope.      Allergies  Review of patient's allergies indicates no known allergies.  Home Medications   Prior to Admission medications   Medication Sig Start Date End Date Taking? Authorizing Provider  amLODipine (NORVASC) 10 MG tablet Take 1 tablet (10 mg total) by mouth daily. Patient taking differently: 10 mg by Feeding Tube route daily.  11/11/14  Yes Heath Lark, MD  Nutritional Supplements (FEEDING SUPPLEMENT, JEVITY 1.2 CAL,) LIQD Place 237 mLs into feeding tube every 3 (three) hours. 07/08/15  Yes Rosita Fire, MD  traMADol (ULTRAM) 50 MG tablet Take 1 tablet (50 mg total) by mouth every 6 (six) hours as needed (pain). Patient taking differently: 50 mg by Feeding Tube route every 6 (six) hours as needed (pain).  07/08/15  Yes Rosita Fire, MD  zolpidem (AMBIEN) 10 MG tablet Take 1 tablet (10 mg total) by mouth at bedtime as needed for sleep. Patient taking differently: 10 mg by Feeding Tube route at bedtime as needed for sleep. For sleep 06/01/15  Yes Rosita Fire, MD  hydrOXYzine (ATARAX/VISTARIL) 25 MG tablet Take 1 tablet (25 mg total) by mouth 3 (three) times daily. Patient not taking: Reported on 07/14/2015 06/01/15   Rosita Fire, MD  sucralfate (CARAFATE) 1 GM/10ML suspension Take 10 mLs (1 g total) by mouth 4 (four) times daily -  with meals and at bedtime. Patient not taking: Reported on 07/14/2015 06/17/15   Rosita Fire, MD   BP 122/63 mmHg  Pulse 117  Temp(Src) 98.9 F (37.2 C) (Oral)  Resp 25  Wt 119 lb (53.978 kg)  SpO2 100% Physical Exam  2005: Physical examination:  Nursing notes reviewed; Vital signs and O2 SAT reviewed;  Constitutional: Well developed, Well nourished, Well hydrated, In no acute distress; Head:  Normocephalic, atraumatic; Eyes: EOMI, PERRL, No scleral icterus; ENMT:  Mouth and pharynx normal, Mucous membranes moist; Neck: Supple, Full range of motion, No lymphadenopathy; Cardiovascular: Regular rate and rhythm, No gallop; Respiratory: Breath sounds clear & equal bilaterally, No wheezes.  Speaking full sentences with ease, Normal respiratory effort/excursion; Chest: Nontender, Movement normal; Abdomen: Soft, Nontender, Nondistended, Normal bowel sounds; Genitourinary: No CVA tenderness; Spine:  No midline CS, TS, LS tenderness.;; Extremities: Pelvis stable. +mild generalized TTP left hip and knee. NT left ankle/foot. +FROM left knee, including able to lift extended LLE off stretcher, and extend left lower leg against resistance.  No ligamentous laxity.  No patellar or quad tendon step-offs.  NMS intact left foot, strong pedal pp. +plantarflexion of left foot w/calf squeeze.  No palpable gap left Achilles's tendon.  LLE muscles compartments soft. No proximal  fibular head tenderness.  No edema, erythema, warmth, ecchymosis or deformity.  No specific area of point tenderness.  Pulses normal, No deformity, no rash. No edema, No calf edema or asymmetry.; Neuro: AA&Ox3, Major CN grossly intact.  Speech clear. +LLE weakness per hx, otherwise no new gross focal motor deficits in extremities.; Skin: Color normal, Warm, Dry.   ED Course  Procedures (including critical care time) Labs Review  Imaging Review  I have personally reviewed and evaluated these images and lab results as part of my medical decision-making.   EKG Interpretation None      MDM  MDM Reviewed: previous chart, nursing note and vitals Interpretation: x-ray   Dg Lumbar Spine Complete 07/27/2015  CLINICAL DATA:  Paget's disease. History of lung cancer. Lumbar pain extending to the left pelvis and leg. EXAM: LUMBAR SPINE - COMPLETE 4+ VIEW COMPARISON:  CT 01/28/2015 FINDINGS: No evidence of regional fracture. Chronic changes of Paget's disease affecting T11, L1, L4, L5 and the sacrum. Lower lumbar facet  arthropathy could also contribute to pain. Also noted is aortic atherosclerosis. IMPRESSION: No acute finding. Chronic Paget's disease affecting T11, L1, L4, L5 and the sacrum. Lower lumbar facet arthropathy could also contribute to pain. Aortic atherosclerosis. Electronically Signed   By: Nelson Chimes M.D.   On: 07/27/2015 20:55   Dg Ankle Complete Left 07/27/2015  CLINICAL DATA:  Lower extremity pain without injury. History of Paget's disease and lung cancer. EXAM: LEFT ANKLE COMPLETE - 3+ VIEW COMPARISON:  08/29/2009 FINDINGS: no abnormality at the ankle joint itself. Paget's disease of the tibia again seen with thickened well of an appearance of the cortex. No evidence of fracture or focal lesion. IMPRESSION: No acute finding.  Chronic changes of Paget's disease of the tibia. Electronically Signed   By: Nelson Chimes M.D.   On: 07/27/2015 20:51   Dg Knee Complete 4 Views Left 07/27/2015  CLINICAL DATA:  History of Paget's disease. Pain at the left lower extremity. No known injury. EXAM: LEFT KNEE - COMPLETE 4+ VIEW COMPARISON:  08/29/2009 FINDINGS: No joint effusion. No fracture, dislocation or degenerative change. No focal bone lesion in this region. IMPRESSION: Negative. Electronically Signed   By: Nelson Chimes M.D.   On: 07/27/2015 20:53     Dg Hip Unilat With Pelvis 2-3 Views Left 07/27/2015  CLINICAL DATA:  Paget's disease. Pain of the left lower extremity. History of lung cancer. EXAM: DG HIP (WITH OR WITHOUT PELVIS) 2-3V LEFT COMPARISON:  None. FINDINGS: Paget's disease diffusely involving the pelvis an the sacrum. No involvement of the proximal femurs. No evidence of fracture or focal destructive lesion. IMPRESSION: Paget's disease diffusely and chronically affecting the bones of the pelvis and the sacrum. No acute finding. Hip joint appears unremarkable. Electronically Signed   By: Nelson Chimes M.D.   On: 07/27/2015 20:52     2125:  XR without acute fx.  Pt requesting "some more pain medicines" and  then to go home. Will re-dose meds before d/c. Will d/c ultram and rx lortab elixir. Doubt DVT, but given hx CA, will have pt return tomorrow for Vasc US LLE. Dx and testing d/w pt and family.  Questions answered.  Verb understanding, agreeable to d/c home with outpt f/u.   Francine Graven, DO 07/28/15 1921

## 2015-07-28 ENCOUNTER — Ambulatory Visit (HOSPITAL_COMMUNITY)
Admit: 2015-07-28 | Discharge: 2015-07-28 | Disposition: A | Discharge status: Home / Self Care | Payer: Medicaid Other | Attending: Emergency Medicine | Admitting: Emergency Medicine

## 2015-07-28 DIAGNOSIS — M79605 Pain in left leg: Secondary | ICD-10-CM | POA: Diagnosis present

## 2015-07-30 ENCOUNTER — Encounter (HOSPITAL_COMMUNITY): Payer: Self-pay

## 2015-07-30 ENCOUNTER — Emergency Department (HOSPITAL_COMMUNITY)
Admission: EM | Admit: 2015-07-30 | Discharge: 2015-07-30 | Disposition: A | Discharge status: Home / Self Care | Payer: Medicaid Other | Attending: Emergency Medicine | Admitting: Emergency Medicine

## 2015-07-30 DIAGNOSIS — R110 Nausea: Secondary | ICD-10-CM | POA: Diagnosis not present

## 2015-07-30 DIAGNOSIS — Z87891 Personal history of nicotine dependence: Secondary | ICD-10-CM | POA: Insufficient documentation

## 2015-07-30 DIAGNOSIS — I10 Essential (primary) hypertension: Secondary | ICD-10-CM | POA: Insufficient documentation

## 2015-07-30 DIAGNOSIS — Z79899 Other long term (current) drug therapy: Secondary | ICD-10-CM | POA: Diagnosis not present

## 2015-07-30 DIAGNOSIS — M25562 Pain in left knee: Secondary | ICD-10-CM | POA: Diagnosis not present

## 2015-07-30 DIAGNOSIS — M549 Dorsalgia, unspecified: Secondary | ICD-10-CM | POA: Insufficient documentation

## 2015-07-30 LAB — COMPREHENSIVE METABOLIC PANEL
ALK PHOS: 71 U/L (ref 38–126)
ALT: 13 U/L — AB (ref 14–54)
AST: 44 U/L — ABNORMAL HIGH (ref 15–41)
Albumin: 3 g/dL — ABNORMAL LOW (ref 3.5–5.0)
Anion gap: 9 (ref 5–15)
BUN: 27 mg/dL — ABNORMAL HIGH (ref 6–20)
CALCIUM: 8.5 mg/dL — AB (ref 8.9–10.3)
CO2: 26 mmol/L (ref 22–32)
CREATININE: 0.85 mg/dL (ref 0.44–1.00)
Chloride: 101 mmol/L (ref 101–111)
Glucose, Bld: 84 mg/dL (ref 65–99)
Potassium: 4 mmol/L (ref 3.5–5.1)
Sodium: 136 mmol/L (ref 135–145)
TOTAL PROTEIN: 6 g/dL — AB (ref 6.5–8.1)
Total Bilirubin: 0.5 mg/dL (ref 0.3–1.2)

## 2015-07-30 LAB — CBC WITH DIFFERENTIAL/PLATELET
Basophils Absolute: 0 10*3/uL (ref 0.0–0.1)
Basophils Relative: 0 %
EOS ABS: 0.1 10*3/uL (ref 0.0–0.7)
Eosinophils Relative: 1 %
HCT: 29.3 % — ABNORMAL LOW (ref 36.0–46.0)
HEMOGLOBIN: 9.4 g/dL — AB (ref 12.0–15.0)
LYMPHS ABS: 0.8 10*3/uL (ref 0.7–4.0)
LYMPHS PCT: 9 %
MCH: 29.9 pg (ref 26.0–34.0)
MCHC: 32.1 g/dL (ref 30.0–36.0)
MCV: 93.3 fL (ref 78.0–100.0)
MONOS PCT: 23 %
Monocytes Absolute: 2.2 10*3/uL — ABNORMAL HIGH (ref 0.1–1.0)
NEUTROS ABS: 6.3 10*3/uL (ref 1.7–7.7)
Neutrophils Relative %: 67 %
Platelets: 325 10*3/uL (ref 150–400)
RBC: 3.14 MIL/uL — ABNORMAL LOW (ref 3.87–5.11)
RDW: 18.4 % — ABNORMAL HIGH (ref 11.5–15.5)
WBC: 9.4 10*3/uL (ref 4.0–10.5)

## 2015-07-30 LAB — C-REACTIVE PROTEIN: CRP: 0.6 mg/dL (ref <1.0)

## 2015-07-30 LAB — URIC ACID: URIC ACID, SERUM: 6.2 mg/dL (ref 2.3–6.6)

## 2015-07-30 LAB — SEDIMENTATION RATE: SED RATE: 40 mm/h — AB (ref 0–22)

## 2015-07-30 MED ORDER — SODIUM CHLORIDE 0.9 % IV BOLUS (SEPSIS)
500.0000 mL | Freq: Once | INTRAVENOUS | Status: AC
  Administered 2015-07-30: 500 mL via INTRAVENOUS

## 2015-07-30 MED ORDER — HYDROMORPHONE HCL 1 MG/ML IJ SOLN
0.5000 mg | Freq: Once | INTRAMUSCULAR | Status: AC
  Administered 2015-07-30: 0.5 mg via INTRAVENOUS
  Filled 2015-07-30: qty 1

## 2015-07-30 MED ORDER — HEPARIN SOD (PORK) LOCK FLUSH 100 UNIT/ML IV SOLN
500.0000 [IU] | Freq: Once | INTRAVENOUS | Status: AC
  Administered 2015-07-30: 500 [IU]
  Filled 2015-07-30: qty 5

## 2015-07-30 MED ORDER — OMEPRAZOLE 20 MG PO CPDR: DELAYED_RELEASE_CAPSULE | ORAL | Status: DC

## 2015-07-30 MED ORDER — LIDOCAINE-EPINEPHRINE (PF) 2 %-1:200000 IJ SOLN
20.0000 mL | Freq: Once | INTRAMUSCULAR | Status: AC
  Administered 2015-07-30: 20 mL
  Filled 2015-07-30: qty 20

## 2015-07-30 MED ORDER — HYDROMORPHONE HCL 1 MG/ML IJ SOLN
0.5000 mg | Freq: Once | INTRAMUSCULAR | Status: AC
Start: 2015-07-30 — End: 2015-07-30
  Administered 2015-07-30: 0.5 mg via INTRAVENOUS
  Filled 2015-07-30: qty 1

## 2015-07-30 NOTE — ED Notes (Signed)
Pt c/o continued left knee pain, redness and swelling. Pt seen in ED 7/4 for same.

## 2015-07-30 NOTE — ED Notes (Signed)
Complain of pain in left knee that started this morning

## 2015-07-30 NOTE — Telephone Encounter (Signed)
Called and informed Megan Sims. ( He said his mom is on the way to [facility] now for her leg and back).  He will call with questions and concerns and he will pick up the Omeprazole at the pharmacy.

## 2015-07-30 NOTE — ED Provider Notes (Signed)
CSN: [id number]     Arrival date & time 07/30/15  1158 History   By signing my name below, I, Megan Sims, attest that this documentation has been prepared under the direction and in the presence of Julianne Rice, MD.  Electronically Signed: Tedra Sims. Sheppard Coil, ED Scribe. 07/30/2015. 12:39 PM.   Chief Complaint  Patient presents with  . Knee Pain   The history is provided by the patient and a relative. No language interpreter was used.    HPI Comments: Megan Sims is a 60 y.o. female with PMHx of Paget disease, HTN, and Stage 3 Lung cancer who presents to the Emergency Department complaining of constant, worsening left knee pain x 1 day. Pt notes that pain has been present for the past month, but has gotten worse. Patient has associated back pain and nausea. Patient was recently seen in [place] on 07/27/15 for similar complaint. Pt states she has taken Hydrocodone liquid for pain with some relief, which she last took about 3 hrs PTA. Per pt's son, he states she has not taken Tramadol prescription due to belief of worsening pain symptoms. Denies any recent injury or fall to area. Denies any fever or chills. Patient's had facial swelling for the past month. This is unchanged. No difficulty breathing or voice changes.  Past Medical History  Diagnosis Date  . Migraine   . Lung abnormality   . Hypertension   . Pleurisy   . Cerebral aneurysm 2 brain surgeries 96 or 97  . Paget disease of bone   . Diarrhea   . Renal insufficiency     Patient states " no kidney problems  . S/P biopsy     of throat per patient.  . Oral thrush 09/24/2014  . Nicotine dependence 09/24/2014  . Chronic neck pain 09/24/2014  . Skin ulcer ([place]) 11/18/2014  . Emphysema of lung ([place]) 05/05/2015  . Malignant neoplasm of unknown origin ([place])   . Cancer [facility])     malignant neoplasm of unknown origin  . Left knee pain   . Left leg weakness 06/2015   Past Surgical History  Procedure Laterality Date  . Chest tube  insertion    . Tubal ligation    . Cerebral aneurysm repair  96 or 97  . Colonoscopy      10 years ago in [place]   . Esophagogastroduodenoscopy  06/16/2015    Dr. Gala Romney: edentulous cricopharyngeus, esophageal plaques, biopsy consistent with candida  . Laryngoscopy  Jun 22, 2015    [facility], Dr. Erik Obey: normal oropharynx, no lesions, mobile vocal cords with good airway.  . Esophagogastroduodenoscopy N/A 06/16/2015    Procedure: ESOPHAGOGASTRODUODENOSCOPY (EGD);  Surgeon: Daneil Dolin, MD;  [facility];  Service: Endoscopy;  Laterality: N/A;  . Peg placement N/A 07/06/2015    Procedure: PERCUTANEOUS ENDOSCOPIC GASTROSTOMY (PEG) PLACEMENT;  Surgeon: Danie Binder, MD;  [facility];  Service: Endoscopy;  Laterality: N/A;  . Esophagogastroduodenoscopy N/A 07/06/2015    Procedure: ESOPHAGOGASTRODUODENOSCOPY (EGD);  Surgeon: Danie Binder, MD;  [facility];  Service: Endoscopy;  Laterality: N/A;   Family History  Problem Relation Age of Onset  . Hypertension    . Diabetes    . Kidney disease    . Cancer Mother     throat ca  . Cancer Maternal Grandmother     thyroid ca  . Colon cancer Neg Hx    Social History  Substance Use Topics  . Smoking status: Former  Smoker -- 0.50 packs/day for 34 years    Types: Cigarettes  . Smokeless tobacco: Never Used     Comment: HOWEVER, will smoke 1 cigarette if "tragic" event (death), or if she needs it for her nerves   . Alcohol Use: No   OB History    Gravida Para Term Preterm AB TAB SAB Ectopic Multiple Living            3     Review of Systems  Constitutional: Negative for fever and chills.  HENT: Positive for facial swelling.   Respiratory: Negative for shortness of breath.   Gastrointestinal: Positive for nausea. Negative for vomiting, abdominal pain and diarrhea.  Musculoskeletal: Positive for back pain and arthralgias. Negative for myalgias.  Skin: Negative for wound.  Neurological: Negative for  dizziness, weakness, light-headedness, numbness and headaches.  All other systems reviewed and are negative.  Allergies  Review of patient's allergies indicates no known allergies.  Home Medications   Prior to Admission medications   Medication Sig Start Date End Date Taking? Authorizing Provider  amLODipine (NORVASC) 10 MG tablet Take 1 tablet (10 mg total) by mouth daily. Patient taking differently: 10 mg by Feeding Tube route daily.  11/11/14  Yes Heath Lark, MD  HYDROcodone-acetaminophen (HYCET) 7.5-325 mg/15 ml solution Take 15 mLs by mouth every 6 (six) hours as needed for moderate pain. 07/27/15 07/26/16 Yes Francine Graven, DO  Nutritional Supplements (FEEDING SUPPLEMENT, JEVITY 1.2 CAL,) LIQD Place 237 mLs into feeding tube every 3 (three) hours. 07/08/15  Yes Rosita Fire, MD  hydrOXYzine (ATARAX/VISTARIL) 25 MG tablet Take 1 tablet (25 mg total) by mouth 3 (three) times daily. Patient not taking: Reported on 07/14/2015 06/01/15   Rosita Fire, MD  omeprazole (PRILOSEC) 20 MG capsule 1 PO WITH BREAKFAST. 07/30/15   Danie Binder, MD  sucralfate (CARAFATE) 1 GM/10ML suspension Take 10 mLs (1 g total) by mouth 4 (four) times daily -  with meals and at bedtime. Patient not taking: Reported on 07/14/2015 06/17/15   Rosita Fire, MD  traMADol (ULTRAM) 50 MG tablet Take 1 tablet (50 mg total) by mouth every 6 (six) hours as needed (pain). Patient taking differently: 50 mg by Feeding Tube route every 6 (six) hours as needed (pain).  07/08/15   Rosita Fire, MD  zolpidem (AMBIEN) 10 MG tablet Take 1 tablet (10 mg total) by mouth at bedtime as needed for sleep. Patient taking differently: 10 mg by Feeding Tube route at bedtime as needed for sleep. For sleep 06/01/15   Rosita Fire, MD   BP 130/77 mmHg  Pulse 96  Temp(Src) 98.1 F (36.7 C) (Oral)  Resp 16  Wt 120 lb (54.432 kg)  SpO2 93% Physical Exam  Constitutional: She is oriented to person, place, and time. She appears well-developed and  well-nourished.  Appears uncomfortable  HENT:  Head: Normocephalic and atraumatic.  Mouth/Throat: Oropharynx is clear and moist.  Bilateral periorbital and lip swelling. No intraoral swelling  Eyes: EOM are normal. Pupils are equal, round, and reactive to light.  Neck: Normal range of motion. Neck supple.  Cardiovascular: Regular rhythm.   Tachycardia  Pulmonary/Chest: Effort normal. No respiratory distress. She has no wheezes. She has rales.  Few scattered rales  Abdominal: Soft. Bowel sounds are normal. She exhibits no distension and no mass. There is no tenderness. There is no rebound and no guarding.  Musculoskeletal: Normal range of motion. She exhibits no edema or tenderness.  Mild effusion to the left knee compared to  right. No warmth appreciated. No definite redness compared to right. Full range of motion. Patient does have tenderness medial border of the joint. No obvious deformity or trauma. No ligamentous instability. Distal pulses are equal and intact. Patient has lumbar tenderness to palpation and palpation of the left sacrum and iliac wing.   Neurological: She is alert and oriented to person, place, and time.  Moving all extremities without deficit. Sensation is intact.  Skin: Skin is warm and dry. Rash noted. No erythema.  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 Her behavior is normal.  Nursing note and vitals reviewed.   ED Course  .Joint Aspiration/Arthrocentesis Date/Time: 07/30/2015 5:25 PM Performed by: Julianne Rice Authorized by: Julianne Rice Consent: Written consent obtained. Risks and benefits: risks, benefits and alternatives were discussed Consent given by: patient Patient identity confirmed: verbally with patient Time out: Immediately prior to procedure a "time out" was called to verify the correct patient, procedure, equipment, support staff and site/side marked as  required. Indications: pain,  possible septic joint and diagnostic evaluation  Body area: knee Joint: left knee Local anesthesia used: yes Anesthesia: local infiltration Local anesthetic: lidocaine 1% with epinephrine Patient sedated: no Preparation: Patient was prepped and draped in the usual sterile fashion. Needle gauge: 18 G Ultrasound guidance: no Approach: medial Aspirate: yellow Aspirate amount: 1 mL Patient tolerance: Patient tolerated the procedure well with no immediate complications   (including critical care time) DIAGNOSTIC STUDIES: Oxygen Saturation is 98% on RA, normal by my interpretation.    COORDINATION OF CARE: 12:29 PM-Discussed treatment plan which includes CBC, CMP, Uric Acid, and order of Dilaudid with pt at bedside and pt agreed to plan.   Labs Review Labs Reviewed  CBC WITH DIFFERENTIAL/PLATELET - Abnormal; Notable for the following:    RBC 3.14 (*)    Hemoglobin 9.4 (*)    HCT 29.3 (*)    RDW 18.4 (*)    Monocytes Absolute 2.2 (*)    All other components within normal limits  COMPREHENSIVE METABOLIC PANEL - Abnormal; Notable for the following:    BUN 27 (*)    Calcium 8.5 (*)    Total Protein 6.0 (*)    Albumin 3.0 (*)    AST 44 (*)    ALT 13 (*)    All other components within normal limits  SEDIMENTATION RATE - Abnormal; Notable for the following:    Sed Rate 40 (*)    All other components within normal limits  BODY FLUID CULTURE  GRAM STAIN  URIC ACID  C-REACTIVE PROTEIN  GLUCOSE, SYNOVIAL FLUID  PROTEIN, SYNOVIAL FLUID  SYNOVIAL CELL COUNT + DIFF, W/ CRYSTALS     MDM   Final diagnoses:  Left knee pain   She appears much more comfortable. She has a normal white blood cell count elevation in her sensation rate. Normal uric acid. Review x-ray of her left knee. During her recent visit. No obvious cause for her pain. Discussed possible inflammation or infection of the knee. Agreed to arthrocentesis in the emergency department. Consent  obtained. Only small amount of aspirate obtained. Sent for culture. Anticipate discharge home to follow-up with her primary physician regarding culture results. Return precautions given.   Julianne Rice, MD 07/30/15 1850

## 2015-07-30 NOTE — Telephone Encounter (Addendum)
Please call pt. HER stomach Bx shows mild gastritis. Continue OMEPRAZOLE WITH FIRST MEAL. FOLLOW UP PRN.  PLEASE CALL WITH QUESTIONS OR CONCERNS.

## 2015-07-30 NOTE — Addendum Note (Signed)
Addended by: Danie Binder on: 07/30/2015 10:22 AM   Modules accepted: Orders

## 2015-07-30 NOTE — Discharge Instructions (Signed)
Make an appointment to follow-up with your primary physician early next week for results of culture from the knee. Return immediately for any redness, swelling, fever or concerns.  Joint Pain Joint pain, which is also called arthralgia, can be caused by many things. Joint pain often goes away when you follow your health care provider's instructions for relieving pain at home. However, joint pain can also be caused by conditions that require further treatment. Common causes of joint pain include:  Bruising in the area of the joint.  Overuse of the joint.  Wear and tear on the joints that occur with aging (osteoarthritis).  Various other forms of arthritis.  A buildup of a crystal form of uric acid in the joint (gout).  Infections of the joint (septic arthritis) or of the bone (osteomyelitis). Your health care provider may recommend medicine to help with the pain. If your joint pain continues, additional tests may be needed to diagnose your condition. HOME CARE INSTRUCTIONS Watch your condition for any changes. Follow these instructions as directed to lessen the pain that you are feeling.  Take medicines only as directed by your health care provider.  Rest the affected area for as long as your health care provider says that you should. If directed to do so, raise the painful joint above the level of your heart while you are sitting or lying down.  Do not do things that cause or worsen pain.  If directed, apply ice to the painful area:  Put ice in a plastic bag.  Place a towel between your skin and the bag.  Leave the ice on for 20 minutes, 2-3 times per day.  Wear an elastic bandage, splint, or sling as directed by your health care provider. Loosen the elastic bandage or splint if your fingers or toes become numb and tingle, or if they turn cold and blue.  Begin exercising or stretching the affected area as directed by your health care provider. Ask your health care provider what  types of exercise are safe for you.  Keep all follow-up visits as directed by your health care provider. This is important. SEEK MEDICAL CARE IF:  Your pain increases, and medicine does not help.  Your joint pain does not improve within 3 days.  You have increased bruising or swelling.  You have a fever.  You lose 10 lb (4.5 kg) or more without trying. SEEK IMMEDIATE MEDICAL CARE IF:  You are not able to move the joint.  Your fingers or toes become numb or they turn cold and blue.   This information is not intended to replace advice given to you by your health care provider. Make sure you discuss any questions you have with your health care provider.   Document Released: 01/09/2005 Document Revised: 01/30/2014 Document Reviewed: 10/21/2013 Elsevier Interactive Patient Education [employer].

## 2015-07-30 NOTE — ED Notes (Signed)
Per Ed in lab, enough left knee synovial fluid for send out culture only.

## 2015-08-02 ENCOUNTER — Ambulatory Visit (HOSPITAL_COMMUNITY)
Admission: RE | Admit: 2015-08-02 | Discharge: 2015-08-02 | Disposition: A | Discharge status: Home / Self Care | Payer: Medicaid Other | Source: Ambulatory Visit | Attending: Hematology and Oncology | Admitting: Hematology and Oncology

## 2015-08-02 ENCOUNTER — Ambulatory Visit (HOSPITAL_BASED_OUTPATIENT_CLINIC_OR_DEPARTMENT_OTHER): Payer: Medicaid Other

## 2015-08-02 ENCOUNTER — Encounter (HOSPITAL_COMMUNITY): Payer: Self-pay

## 2015-08-02 ENCOUNTER — Other Ambulatory Visit (HOSPITAL_BASED_OUTPATIENT_CLINIC_OR_DEPARTMENT_OTHER): Payer: Medicaid Other

## 2015-08-02 VITALS — BP 122/57 | HR 102 | Temp 97.7°F | Resp 16

## 2015-08-02 DIAGNOSIS — J9819 Other pulmonary collapse: Secondary | ICD-10-CM | POA: Diagnosis not present

## 2015-08-02 DIAGNOSIS — I2510 Atherosclerotic heart disease of native coronary artery without angina pectoris: Secondary | ICD-10-CM | POA: Diagnosis not present

## 2015-08-02 DIAGNOSIS — C3492 Malignant neoplasm of unspecified part of left bronchus or lung: Secondary | ICD-10-CM | POA: Insufficient documentation

## 2015-08-02 DIAGNOSIS — Z95828 Presence of other vascular implants and grafts: Secondary | ICD-10-CM

## 2015-08-02 DIAGNOSIS — C3491 Malignant neoplasm of unspecified part of right bronchus or lung: Secondary | ICD-10-CM

## 2015-08-02 DIAGNOSIS — R937 Abnormal findings on diagnostic imaging of other parts of musculoskeletal system: Secondary | ICD-10-CM | POA: Insufficient documentation

## 2015-08-02 DIAGNOSIS — I700 Atherosclerosis of aorta: Secondary | ICD-10-CM | POA: Insufficient documentation

## 2015-08-02 LAB — COMPREHENSIVE METABOLIC PANEL
ALBUMIN: 2.8 g/dL — AB (ref 3.5–5.0)
ALK PHOS: 78 U/L (ref 40–150)
ALT: 16 U/L (ref 0–55)
ANION GAP: 9 meq/L (ref 3–11)
AST: 56 U/L — ABNORMAL HIGH (ref 5–34)
BILIRUBIN TOTAL: 0.37 mg/dL (ref 0.20–1.20)
BUN: 25.4 mg/dL (ref 7.0–26.0)
CO2: 27 mEq/L (ref 22–29)
Calcium: 8.9 mg/dL (ref 8.4–10.4)
Chloride: 103 mEq/L (ref 98–109)
Creatinine: 0.9 mg/dL (ref 0.6–1.1)
EGFR: 83 mL/min/{1.73_m2} — AB (ref >90)
Glucose: 97 mg/dl (ref 70–140)
Potassium: 4.2 mEq/L (ref 3.5–5.1)
Sodium: 139 mEq/L (ref 136–145)
TOTAL PROTEIN: 6 g/dL — AB (ref 6.4–8.3)

## 2015-08-02 LAB — CBC WITH DIFFERENTIAL/PLATELET
BASO%: 0.3 % (ref 0.0–2.0)
BASOS ABS: 0 10*3/uL (ref 0.0–0.1)
EOS ABS: 0.2 10*3/uL (ref 0.0–0.5)
EOS%: 2 % (ref 0.0–7.0)
HEMATOCRIT: 29.6 % — AB (ref 34.8–46.6)
LYMPH#: 1.2 10*3/uL (ref 0.9–3.3)
LYMPH%: 13.7 % — ABNORMAL LOW (ref 14.0–49.7)
MCH: 29.8 pg (ref 25.1–34.0)
MCHC: 32.1 g/dL (ref 31.5–36.0)
MCV: 92.8 fL (ref 79.5–101.0)
MONO#: 1.4 10*3/uL — AB (ref 0.1–0.9)
MONO%: 15.9 % — AB (ref 0.0–14.0)
NEUT#: 6 10*3/uL (ref 1.5–6.5)
NEUT%: 68.1 % (ref 38.4–76.8)
NRBC: 2 % — AB (ref 0–0)
PLATELETS: 287 10*3/uL (ref 145–400)
RBC: 3.19 10*6/uL — ABNORMAL LOW (ref 3.70–5.45)
RDW: 18.4 % — ABNORMAL HIGH (ref 11.2–14.5)
WBC: 8.9 10*3/uL (ref 3.9–10.3)

## 2015-08-02 LAB — TECHNOLOGIST REVIEW

## 2015-08-02 MED ORDER — HEPARIN SOD (PORK) LOCK FLUSH 100 UNIT/ML IV SOLN
500.0000 [IU] | Freq: Once | INTRAVENOUS | Status: AC
  Administered 2015-08-02: 500 [IU] via INTRAVENOUS
  Filled 2015-08-02: qty 5

## 2015-08-02 MED ORDER — SODIUM CHLORIDE 0.9% FLUSH
10.0000 mL | INTRAVENOUS | Status: DC | PRN
Start: 2015-08-02 — End: 2015-08-02
  Administered 2015-08-02: 10 mL via INTRAVENOUS
  Filled 2015-08-02: qty 10

## 2015-08-02 MED ORDER — IOPAMIDOL (ISOVUE-300) INJECTION 61%
75.0000 mL | Freq: Once | INTRAVENOUS | Status: AC | PRN
  Administered 2015-08-02: 75 mL via INTRAVENOUS

## 2015-08-03 ENCOUNTER — Telehealth: Payer: Self-pay | Admitting: Hematology and Oncology

## 2015-08-03 ENCOUNTER — Ambulatory Visit (HOSPITAL_COMMUNITY): Payer: Medicaid Other

## 2015-08-03 ENCOUNTER — Ambulatory Visit (HOSPITAL_BASED_OUTPATIENT_CLINIC_OR_DEPARTMENT_OTHER): Payer: Medicaid Other

## 2015-08-03 ENCOUNTER — Ambulatory Visit (HOSPITAL_BASED_OUTPATIENT_CLINIC_OR_DEPARTMENT_OTHER): Payer: Medicaid Other | Admitting: Hematology and Oncology

## 2015-08-03 VITALS — BP 108/66 | HR 100 | Temp 98.1°F | Resp 17 | Wt 121.0 lb

## 2015-08-03 DIAGNOSIS — L511 Stevens-Johnson syndrome: Secondary | ICD-10-CM

## 2015-08-03 DIAGNOSIS — D649 Anemia, unspecified: Secondary | ICD-10-CM

## 2015-08-03 DIAGNOSIS — C3492 Malignant neoplasm of unspecified part of left bronchus or lung: Secondary | ICD-10-CM

## 2015-08-03 DIAGNOSIS — R1310 Dysphagia, unspecified: Secondary | ICD-10-CM | POA: Diagnosis not present

## 2015-08-03 DIAGNOSIS — M542 Cervicalgia: Secondary | ICD-10-CM | POA: Diagnosis not present

## 2015-08-03 DIAGNOSIS — C3491 Malignant neoplasm of unspecified part of right bronchus or lung: Secondary | ICD-10-CM

## 2015-08-03 DIAGNOSIS — Z931 Gastrostomy status: Secondary | ICD-10-CM

## 2015-08-03 DIAGNOSIS — G8929 Other chronic pain: Secondary | ICD-10-CM

## 2015-08-03 DIAGNOSIS — D539 Nutritional anemia, unspecified: Secondary | ICD-10-CM

## 2015-08-03 DIAGNOSIS — M360 Dermato(poly)myositis in neoplastic disease: Secondary | ICD-10-CM | POA: Diagnosis not present

## 2015-08-03 HISTORY — DX: Anemia, unspecified: D64.9

## 2015-08-03 LAB — CBC & DIFF AND RETIC
BASO%: 0.3 % (ref 0.0–2.0)
Basophils Absolute: 0 10*3/uL (ref 0.0–0.1)
EOS ABS: 0.1 10*3/uL (ref 0.0–0.5)
EOS%: 0.8 % (ref 0.0–7.0)
HEMATOCRIT: 30.8 % — AB (ref 34.8–46.6)
HEMOGLOBIN: 9.9 g/dL — AB (ref 11.6–15.9)
IMMATURE RETIC FRACT: 24.7 % — AB (ref 1.60–10.00)
LYMPH%: 12.7 % — AB (ref 14.0–49.7)
MCH: 29.8 pg (ref 25.1–34.0)
MCHC: 32.1 g/dL (ref 31.5–36.0)
MCV: 92.8 fL (ref 79.5–101.0)
MONO#: 1.1 10*3/uL — AB (ref 0.1–0.9)
MONO%: 11.4 % (ref 0.0–14.0)
NEUT#: 7.1 10*3/uL — ABNORMAL HIGH (ref 1.5–6.5)
NEUT%: 74.8 % (ref 38.4–76.8)
PLATELETS: 305 10*3/uL (ref 145–400)
RBC: 3.32 10*6/uL — ABNORMAL LOW (ref 3.70–5.45)
RDW: 18.6 % — ABNORMAL HIGH (ref 11.2–14.5)
RETIC CT ABS: 117.2 10*3/uL — AB (ref 33.70–90.70)
Retic %: 3.53 % — ABNORMAL HIGH (ref 0.70–2.10)
WBC: 9.5 10*3/uL (ref 3.9–10.3)
lymph#: 1.2 10*3/uL (ref 0.9–3.3)
nRBC: 2 % — ABNORMAL HIGH (ref 0–0)

## 2015-08-03 LAB — IRON AND TIBC
%SAT: 14 % — AB (ref 21–57)
IRON: 35 ug/dL — AB (ref 41–142)
TIBC: 260 ug/dL (ref 236–444)
UIBC: 225 ug/dL (ref 120–384)

## 2015-08-03 LAB — FERRITIN: Ferritin: 515 ng/ml — ABNORMAL HIGH (ref 9–269)

## 2015-08-03 LAB — BODY FLUID CULTURE: Culture: NO GROWTH

## 2015-08-03 LAB — LACTATE DEHYDROGENASE: LDH: 373 U/L — ABNORMAL HIGH (ref 125–245)

## 2015-08-03 LAB — URIC ACID: Uric Acid, Serum: 7.1 mg/dl (ref 2.6–7.4)

## 2015-08-03 MED ORDER — HYDROCODONE-ACETAMINOPHEN 7.5-325 MG/15ML PO SOLN: 15.0000 mL | Freq: Four times a day (QID) | ORAL | Status: DC | PRN

## 2015-08-03 MED ORDER — PREDNISONE 20 MG PO TABS: 20.0000 mg | ORAL_TABLET | Freq: Every day | ORAL | Status: DC

## 2015-08-03 NOTE — Telephone Encounter (Signed)
Added a lab and directed to lab

## 2015-08-03 NOTE — Progress Notes (Signed)
[facility] OFFICE PROGRESS NOTE  Patient Care Team: Rosita Fire, MD as PCP - General (Internal Medicine) Heath Lark, MD as Consulting Physician (Hematology and Oncology) Jodi Marble, MD as Consulting Physician (Otolaryngology) Eppie Gibson, MD as Attending Physician (Radiation Oncology)  SUMMARY OF ONCOLOGIC HISTORY: Oncology History   Bilateral lung cancer   Staging form: Lung, AJCC 6th Edition     Clinical stage from 10/01/2014: Stage IIIB (T4(m), N3, M0) - Signed by Heath Lark, MD on 10/01/2014 Cancer of upper lobe of right lung   Staging form: Lung, AJCC 7th Edition     Clinical: Stage Unknown (TX, N3, M0) - Signed by Eppie Gibson, MD on 10/07/2014       Bilateral lung cancer ([place])   09/05/2014 - 09/12/2014 Hospital Admission The patient was admitted to the hospital for further management of possible skin reaction, dysphagia and supraclavicular lymphadenopathy   09/06/2014 Imaging CT scan of the neck showed malignant appearing superior mediastinal and right thoracic inlet lymphadenopathy   09/10/2014 Imaging CT scan of the abdomen and pelvis confirmed right supraclavicular and mediastinal lymphadenopathy, as described above but no other evidence of metastatic cancer    09/14/2014 Procedure Flexible endoscopy in the ENT office failed to reveal any primary in the head and neck region   09/16/2014 Imaging She failed barium swallow. 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   01/28/2015 Imaging CT scan showed no active disease   04/30/2015 Imaging Mild patchy/ground-glass opacity in the right upper lung lobe, favored to reflect mild residual pneumonia. Associated 10 x 15 mm irregular nodular opacity in the right upper lobe, new, likely infectious.    06/14/2015 Imaging CT neck nonspecific inflammation. CT chest showed Small focal opacities along the right minor fissure and at the right upper lobe are similar in appearance to the prior CTA, though less hazy   06/16/2015 Procedure She had EGD and biopsy. EGD showed edematous cricopharyngeus   07/07/2015 Surgery She has PEG tube placement   07/07/2015 Procedure EGD showed moderate duodenitis and ulcer   08/02/2015 Imaging SJ:[id number] development of complete right middle lobe collapse without a discrete central obstructing lesion evident. The irregular ill-defined central airspace opacity identified previously in the posterior right upper lobe is not substantially changed    INTERVAL HISTORY: Please see below for problem oriented charting. She returns for further follow-up. Over the last few months, she has multiple admissions to local hospital for various different issues. Ultimately, due to dysphagia, she was not able to get adequate nutritional intake and had feeding tube placement She has recurrence of skin rash and is seeing dermatologist. She denies recent cough, chest pain or shortness of breath She has diffuse muscular pain and bone pain especially to centered around her left knee. She underwent joint aspiration of the left knee; results came back unremarkable  REVIEW OF SYSTEMS:   Constitutional: Denies fevers, chills or abnormal weight loss Eyes: Denies blurriness of vision Ears, nose,  mouth, throat, and face: Denies mucositis or sore throat Respiratory: Denies cough, dyspnea or wheezes Cardiovascular: Denies palpitation, chest discomfort or lower extremity swelling Gastrointestinal:  Denies nausea, heartburn or change in bowel habits Lymphatics: Denies new lymphadenopathy or easy bruising Neurological:Denies numbness, tingling or new weaknesses Behavioral/Psych: Mood is stable, no new changes  All other systems were reviewed with the patient and are negative.  I have reviewed the past medical history, past surgical history, social history and family history with the patient and they are unchanged from previous note.  ALLERGIES:  has No Known Allergies.  MEDICATIONS:  Current Outpatient Prescriptions  Medication Sig Dispense Refill  . amLODipine (NORVASC) 10 MG tablet Take 1 tablet (10 mg total) by mouth daily. (Patient taking differently: 10 mg by Feeding Tube route daily. ) 30 tablet 6  . HYDROcodone-acetaminophen (HYCET) 7.5-325 mg/15 ml solution Take 15 mLs by mouth every 6 (six) hours as needed for moderate pain. 473 mL 0  . Nutritional Supplements (FEEDING SUPPLEMENT, JEVITY 1.2 CAL,) LIQD Place 237 mLs into feeding tube every 3 (three) hours. 237 mL 3  . omeprazole (PRILOSEC) 20 MG capsule 1 PO WITH BREAKFAST. 90 capsule 3  . predniSONE (DELTASONE) 20 MG tablet Take 1 tablet (20 mg total) by mouth daily with breakfast. 30 tablet 0   No current facility-administered medications for this visit.    PHYSICAL EXAMINATION: ECOG PERFORMANCE STATUS: 1 - Symptomatic but completely ambulatory  Filed Vitals:   08/03/15 1054  BP: 108/66  Pulse: 100  Temp: 98.1 F (36.7 C)  Resp: 17   Filed Weights   08/03/15 1054  Weight: 121 lb (54.885 kg)    GENERAL:alert, no distress and comfortable. She look sicker than the last time I saw her SKIN: She has significant rash on her face with erythematous and swollen appearance EYES: normal, Conjunctiva are pale and  non-injected, sclera clear OROPHARYNX:no exudate, no erythema and lips, buccal mucosa, and tongue normal  NECK: supple, thyroid normal size, non-tender, without nodularity LYMPH:  no palpable lymphadenopathy in the cervical, axillary or inguinal LUNGS: clear to auscultation and percussion with normal breathing effort HEART: regular rate & rhythm and no murmurs and no lower extremity edema ABDOMEN:abdomen soft, non-tender and normal bowel sounds. Feeding tube site looks okay Musculoskeletal:no cyanosis of digits and no clubbing . There is no joint swelling affecting the left knee NEURO: alert & oriented x 3 with fluent speech, no focal motor/sensory deficits  LABORATORY DATA:  I have reviewed the data as listed    Component Value Date/Time   NA 139 08/02/2015 1017   NA 136 07/30/2015 1309   K 4.2 08/02/2015 1017   K 4.0 07/30/2015 1309   CL 101 07/30/2015 1309   CO2 27 08/02/2015 1017   CO2 26 07/30/2015 1309   GLUCOSE 97 08/02/2015 1017   GLUCOSE 84 07/30/2015 1309   BUN 25.4 08/02/2015 1017   BUN 27* 07/30/2015 1309   CREATININE 0.9 08/02/2015 1017   CREATININE 0.85 07/30/2015 1309   CALCIUM 8.9 08/02/2015 1017   CALCIUM 8.5* 07/30/2015 1309   PROT 6.0* 08/02/2015 1017   PROT 6.0* 07/30/2015 1309   ALBUMIN 2.8* 08/02/2015 1017   ALBUMIN 3.0* 07/30/2015 1309   AST 56* 08/02/2015 1017   AST 44* 07/30/2015 1309   ALT 16 08/02/2015 1017   ALT 13* 07/30/2015 1309   ALKPHOS 78 08/02/2015  1017   ALKPHOS 71 07/30/2015 1309   BILITOT 0.37 08/02/2015 1017   BILITOT 0.5 07/30/2015 1309   GFRNONAA >60 07/30/2015 1309   GFRAA >60 07/30/2015 1309    No results found for: SPEP, UPEP  Lab Results  Component Value Date   WBC 9.5 08/03/2015   NEUTROABS 7.1* 08/03/2015   HGB 9.9* 08/03/2015   HCT 30.8* 08/03/2015   MCV 92.8 08/03/2015   PLT 305 08/03/2015      Chemistry      Component Value Date/Time   NA 139 08/02/2015 1017   NA 136 07/30/2015 1309   K 4.2 08/02/2015 1017    K 4.0 07/30/2015 1309   CL 101 07/30/2015 1309   CO2 27 08/02/2015 1017   CO2 26 07/30/2015 1309   BUN 25.4 08/02/2015 1017   BUN 27* 07/30/2015 1309   CREATININE 0.9 08/02/2015 1017   CREATININE 0.85 07/30/2015 1309      Component Value Date/Time   CALCIUM 8.9 08/02/2015 1017   CALCIUM 8.5* 07/30/2015 1309   ALKPHOS 78 08/02/2015 1017   ALKPHOS 71 07/30/2015 1309   AST 56* 08/02/2015 1017   AST 44* 07/30/2015 1309   ALT 16 08/02/2015 1017   ALT 13* 07/30/2015 1309   BILITOT 0.37 08/02/2015 1017   BILITOT 0.5 07/30/2015 1309       RADIOGRAPHIC STUDIES: I have personally reviewed the radiological images as listed and agreed with the findings in the report. Ct Chest W Contrast  08/02/2015  CLINICAL DATA:  History of moderately to poorly differentiated carcinoma of likely lung primary. Status post radiation to right chest supraclavicular area. EXAM: CT CHEST WITH CONTRAST TECHNIQUE: Multidetector CT imaging of the chest was performed during intravenous contrast administration. CONTRAST:  24m ISOVUE-300 IOPAMIDOL (ISOVUE-300) INJECTION 61% COMPARISON:  06/14/2015 FINDINGS: Mediastinum / Lymph Nodes: Prominent axillary lymph nodes bilaterally demonstrate central fatty hila but have progressed slightly in the interval. Right axillary lymph node measures up to 10 mm short axis. No mediastinal or hilar lymphadenopathy. Heart size is normal. Coronary artery calcification is noted. The esophagus has normal imaging features. There is some edema in the soft tissues of the lower neck and upper chest anteriorly. No definite supraclavicular lesion is identified. Lungs / Pleura: Lung windows again demonstrate emphysema. Interstitial and irregular confluent airspace opacity in the posterior right upper lobe is stable in the interval. The patient has developed progressive right middle lobe collapse since prior CT without a discrete centrally obstructing lesion evident. 4 mm left lower lobe pulmonary  nodule is stable. No pulmonary edema or pleural effusion. Upper Abdomen: Gastrostomy tube again noted. Atherosclerotic calcification noted in the wall of the incompletely visualized abdominal aorta. 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 IMPRESSION: 1. Interval development of complete right middle lobe collapse without a discrete central obstructing lesion evident. 2. The irregular ill-defined central airspace opacity identified previously in the posterior right upper lobe is not substantially changed in the interval. 3. Coronary artery and abdominal aortic atherosclerosis. 4. 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 Electronically Signed   By: EMisty StanleyM.D.   On: 08/02/2015 15:28     ASSESSMENT & PLAN:  Bilateral lung cancer ([place] Given those CT scan showed no definitive signs of cancer recurrence, I'm concerned about the complete right middle lobe collapse. I will get a scan reviewed at the next ENT tumor board next week for further discussion  Chronic neck pain The patient is not consistent with the pain medicine. Her pain is currently under control. I refill her pain medicine prescription today.     Deficiency anemia This is likely anemia of chronic disease. The patient denies recent history of bleeding such as epistaxis, hematuria or hematochezia. She is asymptomatic from the anemia. We will observe for now.  She does not require transfusion now.  I will order additional workup for this.   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 Overall, I do not believe she Stevens-Johnson syndrome. Rather, I suspect she may have dermatomyositis. I will order additional workup for this In the short-term, I will put her on low-dose prednisone therapy  Dysphagia She has chronic dysphagia with possible risk of aspiration. The  cause is unknown but that could be related to esophageal stricture. Neuromuscular involvement of dermatomyositis cannot be excluded She is dependent on feeding tube. I will get her speech and swallow assessment again  S/P gastrostomy ([place]) The feeding tube site looks okay without signs of infection. She will continue nutritional supplement as directed     Orders Placed This Encounter  Procedures  . CBC & Diff and Retic    Standing Status: Future     Number of Occurrences: 1     Standing Expiration Date: 09/06/2016  . Ferritin    Standing Status: Future     Number of Occurrences: 1     Standing Expiration Date: 09/06/2016  . Sedimentation rate    Standing Status: Future     Number of Occurrences: 1     Standing Expiration Date: 09/06/2016  . Iron and TIBC    Standing Status: Future     Number of Occurrences: 1     Standing Expiration Date: 09/06/2016  . CK, total    Standing Status: Future     Number of Occurrences: 1     Standing Expiration Date: 09/06/2016  . Uric acid    Standing Status: Future     Number of Occurrences: 1     Standing Expiration Date: 09/06/2016  . Lactate dehydrogenase (LDH)    Standing Status: Future     Number of Occurrences: 1     Standing Expiration Date: 09/06/2016   All questions were answered. The patient knows to call the clinic with any problems, questions or concerns. No barriers to learning was detected. I spent 30 minutes counseling the patient face to face. The total time spent in the appointment was 40 minutes and more than 50% was on counseling and review of test results     [facility], [place], MD 08/04/2015 7:28 AM   .

## 2015-08-04 ENCOUNTER — Other Ambulatory Visit (HOSPITAL_COMMUNITY): Payer: Self-pay | Admitting: Internal Medicine

## 2015-08-04 ENCOUNTER — Telehealth: Payer: Self-pay | Admitting: *Deleted

## 2015-08-04 ENCOUNTER — Encounter: Payer: Self-pay | Admitting: Hematology and Oncology

## 2015-08-04 DIAGNOSIS — M360 Dermato(poly)myositis in neoplastic disease: Secondary | ICD-10-CM | POA: Insufficient documentation

## 2015-08-04 DIAGNOSIS — Z931 Gastrostomy status: Secondary | ICD-10-CM | POA: Insufficient documentation

## 2015-08-04 DIAGNOSIS — N6001 Solitary cyst of right breast: Secondary | ICD-10-CM

## 2015-08-04 DIAGNOSIS — N6002 Solitary cyst of left breast: Principal | ICD-10-CM

## 2015-08-04 LAB — SEDIMENTATION RATE: Sedimentation Rate-Westergren: 59 mm/hr — ABNORMAL HIGH (ref 0–40)

## 2015-08-04 LAB — CK: Creatine Kinase Total: 459 U/L — ABNORMAL HIGH (ref 24–173)

## 2015-08-04 NOTE — Assessment & Plan Note (Signed)
The feeding tube site looks okay without signs of infection. She will continue nutritional supplement as directed

## 2015-08-04 NOTE — Assessment & Plan Note (Signed)
She has profound muscular pain and abnormal skin rashes. Overall, I do not believe she Stevens-Johnson syndrome. Rather, I suspect she may have dermatomyositis. I will order additional workup for this In the short-term, I will put her on low-dose prednisone therapy

## 2015-08-04 NOTE — Assessment & Plan Note (Signed)
Given those CT scan showed no definitive signs of cancer recurrence, I'm concerned about the complete right middle lobe collapse. I will get a scan reviewed at the next ENT tumor board next week for further discussion

## 2015-08-04 NOTE — Assessment & Plan Note (Signed)
The patient is not consistent with the pain medicine. Her pain is currently under control. I refill her pain medicine prescription today.

## 2015-08-04 NOTE — Assessment & Plan Note (Signed)
She has chronic dysphagia with possible risk of aspiration. The cause is unknown but that could be related to esophageal stricture. Neuromuscular involvement of dermatomyositis cannot be excluded She is dependent on feeding tube. I will get her speech and swallow assessment again

## 2015-08-04 NOTE — Telephone Encounter (Signed)
VM from [place] at [facility] [place].  She says pt's MD up there has referred pt for Hospice.  They are requesting records from our clinic regarding pt's lung cancer.  Faxed over office note from yesterday to them at fax [phone].Marland Kitchen  Notified Dr. Alvy Bimler.  [facility] [phone]) and s/w [place].  Notified her of records faxed.  Notified her Lung Cancer in remission per Dr. Alvy Bimler but pt does have other multiple medical problems.   Hospice will assess pt's eligibility for admission.

## 2015-08-04 NOTE — Assessment & Plan Note (Signed)
This is likely anemia of chronic disease. The patient denies recent history of bleeding such as epistaxis, hematuria or hematochezia. She is asymptomatic from the anemia. We will observe for now.  She does not require transfusion now.  I will order additional workup for this.

## 2015-08-05 ENCOUNTER — Telehealth: Payer: Self-pay | Admitting: *Deleted

## 2015-08-05 NOTE — Telephone Encounter (Signed)
Son left VM reporting pt's right leg is swollen and her left leg is "giving out" on her.  States pt cannot walk and they are having to carry her from room to room.   They called pt's PCP who instructed to call Dr. Alvy Bimler.   Dr. Alvy Bimler instructs for pt to increase Prednisone from 20 mg daily to 60 mg.   She can see pt tomorrow at 3:15 pm. Instructed son for pt to increase Prednisone to take extra 40 mg today to equal 60 mg total today.  Take 60 mg in the morning.  Come to [facility] to see Dr. Alvy Bimler tomorrow afternoon at 3 pm.   Call us back if anything changes.  Son verbalized understanding.

## 2015-08-06 ENCOUNTER — Encounter (HOSPITAL_COMMUNITY): Payer: Self-pay | Admitting: Emergency Medicine

## 2015-08-06 ENCOUNTER — Inpatient Hospital Stay (HOSPITAL_COMMUNITY)
Admission: EM | Admit: 2015-08-06 | Discharge: 2015-08-13 | DRG: 481 | Disposition: A | Discharge status: Skilled Nursing Facility | Payer: Medicaid Other | Attending: Family Medicine | Admitting: Family Medicine

## 2015-08-06 ENCOUNTER — Emergency Department (HOSPITAL_COMMUNITY): Payer: Medicaid Other

## 2015-08-06 ENCOUNTER — Ambulatory Visit (HOSPITAL_BASED_OUTPATIENT_CLINIC_OR_DEPARTMENT_OTHER): Payer: Medicaid Other | Admitting: Hematology and Oncology

## 2015-08-06 VITALS — BP 158/71 | HR 103 | Temp 98.2°F | Resp 18 | Ht 61.0 in | Wt 120.2 lb

## 2015-08-06 DIAGNOSIS — Z931 Gastrostomy status: Secondary | ICD-10-CM

## 2015-08-06 DIAGNOSIS — S7222XA Displaced subtrochanteric fracture of left femur, initial encounter for closed fracture: Principal | ICD-10-CM | POA: Diagnosis present

## 2015-08-06 DIAGNOSIS — K279 Peptic ulcer, site unspecified, unspecified as acute or chronic, without hemorrhage or perforation: Secondary | ICD-10-CM | POA: Diagnosis present

## 2015-08-06 DIAGNOSIS — T148XXA Other injury of unspecified body region, initial encounter: Secondary | ICD-10-CM

## 2015-08-06 DIAGNOSIS — W109XXA Fall (on) (from) unspecified stairs and steps, initial encounter: Secondary | ICD-10-CM | POA: Diagnosis present

## 2015-08-06 DIAGNOSIS — C3492 Malignant neoplasm of unspecified part of left bronchus or lung: Secondary | ICD-10-CM | POA: Diagnosis present

## 2015-08-06 DIAGNOSIS — M360 Dermato(poly)myositis in neoplastic disease: Secondary | ICD-10-CM | POA: Diagnosis present

## 2015-08-06 DIAGNOSIS — Z8249 Family history of ischemic heart disease and other diseases of the circulatory system: Secondary | ICD-10-CM

## 2015-08-06 DIAGNOSIS — C3491 Malignant neoplasm of unspecified part of right bronchus or lung: Secondary | ICD-10-CM | POA: Diagnosis present

## 2015-08-06 DIAGNOSIS — S72302A Unspecified fracture of shaft of left femur, initial encounter for closed fracture: Secondary | ICD-10-CM | POA: Diagnosis present

## 2015-08-06 DIAGNOSIS — J9819 Other pulmonary collapse: Secondary | ICD-10-CM | POA: Diagnosis not present

## 2015-08-06 DIAGNOSIS — M889 Osteitis deformans of unspecified bone: Secondary | ICD-10-CM | POA: Diagnosis present

## 2015-08-06 DIAGNOSIS — D62 Acute posthemorrhagic anemia: Secondary | ICD-10-CM | POA: Insufficient documentation

## 2015-08-06 DIAGNOSIS — Z833 Family history of diabetes mellitus: Secondary | ICD-10-CM

## 2015-08-06 DIAGNOSIS — I739 Peripheral vascular disease, unspecified: Secondary | ICD-10-CM | POA: Diagnosis present

## 2015-08-06 DIAGNOSIS — D638 Anemia in other chronic diseases classified elsewhere: Secondary | ICD-10-CM | POA: Diagnosis present

## 2015-08-06 DIAGNOSIS — K922 Gastrointestinal hemorrhage, unspecified: Secondary | ICD-10-CM | POA: Insufficient documentation

## 2015-08-06 DIAGNOSIS — G8929 Other chronic pain: Secondary | ICD-10-CM | POA: Diagnosis present

## 2015-08-06 DIAGNOSIS — M542 Cervicalgia: Secondary | ICD-10-CM | POA: Diagnosis present

## 2015-08-06 DIAGNOSIS — M3310 Other dermatopolymyositis, organ involvement unspecified: Secondary | ICD-10-CM

## 2015-08-06 DIAGNOSIS — J439 Emphysema, unspecified: Secondary | ICD-10-CM | POA: Diagnosis present

## 2015-08-06 DIAGNOSIS — S7292XA Unspecified fracture of left femur, initial encounter for closed fracture: Secondary | ICD-10-CM | POA: Insufficient documentation

## 2015-08-06 DIAGNOSIS — Z87891 Personal history of nicotine dependence: Secondary | ICD-10-CM

## 2015-08-06 DIAGNOSIS — Z923 Personal history of irradiation: Secondary | ICD-10-CM

## 2015-08-06 DIAGNOSIS — Z85118 Personal history of other malignant neoplasm of bronchus and lung: Secondary | ICD-10-CM

## 2015-08-06 DIAGNOSIS — W010XXA Fall on same level from slipping, tripping and stumbling without subsequent striking against object, initial encounter: Secondary | ICD-10-CM | POA: Diagnosis present

## 2015-08-06 DIAGNOSIS — T451X5A Adverse effect of antineoplastic and immunosuppressive drugs, initial encounter: Secondary | ICD-10-CM | POA: Diagnosis present

## 2015-08-06 DIAGNOSIS — R0902 Hypoxemia: Secondary | ICD-10-CM | POA: Diagnosis not present

## 2015-08-06 DIAGNOSIS — I10 Essential (primary) hypertension: Secondary | ICD-10-CM | POA: Diagnosis present

## 2015-08-06 DIAGNOSIS — D6481 Anemia due to antineoplastic chemotherapy: Secondary | ICD-10-CM | POA: Diagnosis present

## 2015-08-06 DIAGNOSIS — Z808 Family history of malignant neoplasm of other organs or systems: Secondary | ICD-10-CM

## 2015-08-06 LAB — CBC WITH DIFFERENTIAL/PLATELET
Basophils Absolute: 0 10*3/uL (ref 0.0–0.1)
Basophils Relative: 0 %
Eosinophils Absolute: 0 10*3/uL (ref 0.0–0.7)
Eosinophils Relative: 0 %
HEMATOCRIT: 29.2 % — AB (ref 36.0–46.0)
Hemoglobin: 9.5 g/dL — ABNORMAL LOW (ref 12.0–15.0)
LYMPHS PCT: 13 %
Lymphs Abs: 1.1 10*3/uL (ref 0.7–4.0)
MCH: 30.1 pg (ref 26.0–34.0)
MCHC: 32.5 g/dL (ref 30.0–36.0)
MCV: 92.4 fL (ref 78.0–100.0)
MONO ABS: 0.9 10*3/uL (ref 0.1–1.0)
MONOS PCT: 10 %
NEUTROS ABS: 6.6 10*3/uL (ref 1.7–7.7)
Neutrophils Relative %: 77 %
Platelets: 321 10*3/uL (ref 150–400)
RBC: 3.16 MIL/uL — ABNORMAL LOW (ref 3.87–5.11)
RDW: 18.8 % — AB (ref 11.5–15.5)
WBC: 8.6 10*3/uL (ref 4.0–10.5)

## 2015-08-06 LAB — PROTIME-INR
INR: 0.99 (ref 0.00–1.49)
Prothrombin Time: 12.9 seconds (ref 11.6–15.2)

## 2015-08-06 LAB — BASIC METABOLIC PANEL
Anion gap: 9 (ref 5–15)
BUN: 34 mg/dL — ABNORMAL HIGH (ref 6–20)
CO2: 26 mmol/L (ref 22–32)
Calcium: 8.9 mg/dL (ref 8.9–10.3)
Chloride: 101 mmol/L (ref 101–111)
Creatinine, Ser: 0.83 mg/dL (ref 0.44–1.00)
GFR calc non Af Amer: >60 mL/min (ref >60)
GLUCOSE: 179 mg/dL — AB (ref 65–99)
POTASSIUM: 4.3 mmol/L (ref 3.5–5.1)
Sodium: 136 mmol/L (ref 135–145)

## 2015-08-06 LAB — ABO/RH: ABO/RH(D): O POS

## 2015-08-06 MED ORDER — FENTANYL CITRATE (PF) 100 MCG/2ML IJ SOLN
50.0000 ug | Freq: Once | INTRAMUSCULAR | Status: AC
  Administered 2015-08-06: 50 ug via INTRAMUSCULAR
  Filled 2015-08-06: qty 2

## 2015-08-06 MED ORDER — PREDNISONE 20 MG PO TABS: 60.0000 mg | ORAL_TABLET | Freq: Every day | ORAL | Status: DC

## 2015-08-06 MED ORDER — FENTANYL CITRATE (PF) 100 MCG/2ML IJ SOLN
50.0000 ug | Freq: Once | INTRAMUSCULAR | Status: AC
  Administered 2015-08-06: 50 ug via INTRAVENOUS
  Filled 2015-08-06: qty 2

## 2015-08-06 MED ORDER — SUCRALFATE 1 GM/10ML PO SUSP: 1.0000 g | Freq: Three times a day (TID) | ORAL | Status: DC

## 2015-08-06 MED ORDER — OMEPRAZOLE 20 MG PO CPDR: DELAYED_RELEASE_CAPSULE | ORAL | Status: DC

## 2015-08-06 MED ORDER — HYDROMORPHONE HCL 1 MG/ML IJ SOLN
1.0000 mg | Freq: Once | INTRAMUSCULAR | Status: AC
  Administered 2015-08-06: 1 mg via INTRAVENOUS
  Filled 2015-08-06: qty 1

## 2015-08-06 NOTE — ED Notes (Signed)
Pt is xray

## 2015-08-06 NOTE — ED Notes (Signed)
Pt was seen earlier today by her cancer dr  Pt is from home and fell this evening as she was going up the steps to go inside   Pt is c/o left hip pain  Tender on palpation  Pt is a cancer pt and states has bone cancer

## 2015-08-06 NOTE — ED Provider Notes (Signed)
CSN: [id number]     Arrival date & time 08/06/15  2041 History  By signing my name below, I, Gwenlyn Fudge, attest that this documentation has been prepared under the direction and in the presence of Abigail Butts, [place]. Electronically Signed: Gwenlyn Fudge, ED Scribe. 08/06/2015. 9:55 PM.   Chief Complaint  Patient presents with  . Fall  . Hip Pain   The history is provided by the patient and medical records. No language interpreter was used.    HPI Comments: Megan Sims is a 60 y.o. female via EMS with PMHx of HTN, Paget Disease, and Stage 3 Lung Cancer who presents to the Emergency Department complaining of left leg pain. Pt states she was discharged and instructed to walk up the stairs when her right leg buckled and gave out. Pt states she twisted to her left and heard her leg pop. Pt states she slid down the stairs, but denies serious a fall or any head injury. 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 Pt states this is her first time breaking her leg. Pt states she has back pain across her buttocks. She denies PSHx on her hips. Pt states she did not receive any shots in her arm or an IV placed by EMS on her way to the ED.   Past Medical History  Diagnosis Date  . Migraine   . Lung abnormality   . Hypertension   . Pleurisy   . Cerebral aneurysm 2 brain surgeries 96 or 97  . Paget disease of bone   . Diarrhea   . Renal insufficiency     Patient states " no kidney problems  . S/P biopsy     of throat per patient.  . Oral thrush 09/24/2014  . Nicotine dependence 09/24/2014  . Chronic neck pain 09/24/2014  . Skin ulcer ([place]) 11/18/2014  . Emphysema of lung ([place]) 05/05/2015  . Malignant neoplasm of unknown origin ([place])   . Cancer [facility])     malignant neoplasm of unknown origin  . Left knee pain   . Left leg weakness 06/2015   Past Surgical History  Procedure Laterality Date  . Chest tube insertion    . Tubal ligation    . Cerebral aneurysm repair  96 or 97  . Colonoscopy      10 years ago  in [place]   . Esophagogastroduodenoscopy  06/16/2015    Dr. Gala Romney: edentulous cricopharyngeus, esophageal plaques, biopsy consistent with candida  . Laryngoscopy  Jun 22, 2015    [facility], Dr. Erik Obey: normal oropharynx, no lesions, mobile vocal cords with good airway.  . Esophagogastroduodenoscopy N/A 06/16/2015    Procedure: ESOPHAGOGASTRODUODENOSCOPY (EGD);  Surgeon: Daneil Dolin, MD;  Location: [facility];  Service: Endoscopy;  Laterality: N/A;  . Peg placement N/A 07/06/2015    Procedure: PERCUTANEOUS ENDOSCOPIC GASTROSTOMY (PEG) PLACEMENT;  Surgeon: Danie Binder, MD;  Location: [facility];  Service: Endoscopy;  Laterality: N/A;  . Esophagogastroduodenoscopy N/A 07/06/2015    Procedure: ESOPHAGOGASTRODUODENOSCOPY (EGD);  Surgeon: Danie Binder, MD;  Location: [facility];  Service: Endoscopy;  Laterality: N/A;   Family History  Problem Relation Age of Onset  . Hypertension    . Diabetes    . Kidney disease    . Cancer Mother     throat ca  . Cancer Maternal Grandmother     thyroid ca  . Colon cancer Neg Hx    Social History  Substance Use Topics  . Smoking status: Former Smoker --  0.50 packs/day for 34 years    Types: Cigarettes  . Smokeless tobacco: Never Used     Comment: HOWEVER, will smoke 1 cigarette if "tragic" event (death), or if she needs it for her nerves   . Alcohol Use: No   OB History    Gravida Para Term Preterm AB TAB SAB Ectopic Multiple Living            3     Review of Systems  Constitutional: Negative for fever, diaphoresis, appetite change, fatigue and unexpected weight change.  HENT: Negative for mouth sores.   Eyes: Negative for visual disturbance.  Respiratory: Negative for cough, chest tightness, shortness of breath and wheezing.   Cardiovascular: Negative for chest pain.  Gastrointestinal: Negative for nausea, vomiting, abdominal pain, diarrhea and constipation.  Endocrine: Negative for polydipsia, polyphagia and polyuria.   Genitourinary: Negative for dysuria, urgency, frequency and hematuria.  Musculoskeletal: Positive for arthralgias ( left hip and leg). Negative for back pain and neck stiffness.  Skin: Negative for rash.  Allergic/Immunologic: Negative for immunocompromised state.  Neurological: Negative for syncope, light-headedness and headaches.  Hematological: Does not bruise/bleed easily.  Psychiatric/Behavioral: Negative for sleep disturbance. The patient is not nervous/anxious.   All other systems reviewed and are negative.     Allergies  Review of patient's allergies indicates no known allergies.  Home Medications   Prior to Admission medications   Medication Sig Start Date End Date Taking? Authorizing Provider  HYDROcodone-acetaminophen (HYCET) 7.5-325 mg/15 ml solution Take 15 mLs by mouth every 6 (six) hours as needed for moderate pain. 08/03/15 08/02/16 Yes Heath Lark, MD  Nutritional Supplements (FEEDING SUPPLEMENT, JEVITY 1.2 CAL,) LIQD Place 237 mLs into feeding tube every 3 (three) hours. 07/08/15  Yes Rosita Fire, MD  predniSONE (DELTASONE) 20 MG tablet Take 3 tablets (60 mg total) by mouth daily with breakfast. 08/06/15  Yes Heath Lark, MD  amLODipine (NORVASC) 10 MG tablet Take 1 tablet (10 mg total) by mouth daily. Patient taking differently: 10 mg by Feeding Tube route daily.  11/11/14   Heath Lark, MD  omeprazole (PRILOSEC) 20 MG capsule Liquid Form 2 mg/ml.  Take 10 ml (20 mg) twice daily. 08/06/15   Heath Lark, MD  sucralfate (CARAFATE) 1 GM/10ML suspension Take 10 mLs (1 g total) by mouth 4 (four) times daily -  with meals and at bedtime. 08/06/15   Ni Gorsuch, MD   BP 159/83 mmHg  Pulse 98  Temp(Src) 97.6 F (36.4 C) (Oral)  Resp 16  SpO2 96% Physical Exam  Constitutional: She appears well-developed and well-nourished. She appears distressed.  Awake, alert, screaming in pain  HENT:  Head: Normocephalic and atraumatic.  Mouth/Throat: Oropharynx is clear and moist. No  oropharyngeal exudate.  Eyes: Conjunctivae are normal. No scleral icterus.  Neck: Normal range of motion. Neck supple.  Cardiovascular: Regular rhythm, normal heart sounds and intact distal pulses.  Tachycardia present.   Pulses:      Radial pulses are 2+ on the right side, and 2+ on the left side.       Dorsalis pedis pulses are 2+ on the right side, and 2+ on the left side.  Capillary refill < 3 sec Mild tachycardia  Pulmonary/Chest: Effort normal and breath sounds normal. No respiratory distress. She has no wheezes.  Equal chest expansion  Abdominal: Soft. Bowel sounds are normal. She exhibits no mass. There is no tenderness. There is no rebound and no guarding.  Musculoskeletal: She exhibits no edema.  Patient refuses  to move entire left leg, good range of motion of the toes of the left foot Shortening and internal rotation of the left leg Palpable deformity of the left hip Pelvis stable  Neurological: She is alert.  Speech is clear and goal oriented Moves extremities without ataxia  Skin: Skin is warm and dry. She is not diaphoretic.  Small abrasion to the right knee  Psychiatric: She has a normal mood and affect.  Nursing note and vitals reviewed.   ED Course  Procedures (including critical care time)  DIAGNOSTIC STUDIES: Oxygen Saturation is 96% on RA, adequate by my interpretation.    COORDINATION OF CARE: 9:01 PM Discussed treatment plan with pt at bedside which includes DG Femur 1V Left, DG Hip Unilat with Pelvis 2-3 Views Left and lab work and pt agreed to plan.  Labs Review Labs Reviewed  BASIC METABOLIC PANEL - Abnormal; Notable for the following:    Glucose, Bld 179 (*)    BUN 34 (*)    All other components within normal limits  CBC WITH DIFFERENTIAL/PLATELET - Abnormal; Notable for the following:    RBC 3.16 (*)    Hemoglobin 9.5 (*)    HCT 29.2 (*)    RDW 18.8 (*)    All other components within normal limits  PROTIME-INR  TYPE AND SCREEN  ABO/RH     Imaging Review Dg Pelvis 1-2 Views  08/06/2015  CLINICAL DATA:  Left leg pain after a fall today. EXAM: PELVIS - 1-2 VIEW COMPARISON:  None. FINDINGS: There is a transverse minimally comminuted fracture of the proximal left femoral shaft below the level of lesser trochanter. There is impaction and medial angulation of the distal fracture fragment. Diffuse coarsening of trabecular pattern in the pelvis bilaterally consistent with Paget's disease. Patches changes also probably involving the sacrum although limited visualization due to overlying bowel. Calcifications in the pelvis consistent with phleboliths. IMPRESSION: Transverse mildly comminuted fracture of the proximal left femoral shaft with medial angulation and impaction of the fracture fragments. Changes of Paget's disease in the pelvis and sacrum. Electronically Signed   By: Lucienne Capers M.D.   On: 08/06/2015 22:36   Dg Tibia/fibula Left  08/06/2015  CLINICAL DATA:  60 year old female with fall and left leg pain. EXAM: LEFT TIBIA AND FIBULA - 2 VIEW; LEFT FOOT - COMPLETE 3+ VIEW; LEFT FEMUR 2 VIEWS COMPARISON:  Pelvic radiograph dated 08/06/2015 FINDINGS: There is a transverse fracture of the left femoral proximal diaphysis with mild medial angulation and impaction. No other acute fracture identified. There is no dislocation. There is bony expansion of the pelvis with trabecular coarsening compatible with Paget's disease. There is also pagetoid changes of the tibia with cortical thickening and bowing of the bone. The soft tissues appear unremarkable IMPRESSION: Mildly angulated and impacted fracture of the proximal left femoral diaphysis. Pagetoid changes of the pelvic bone and tibia. Electronically Signed   By: Anner Crete M.D.   On: 08/06/2015 22:40   Dg Foot Complete Left  08/06/2015  CLINICAL DATA:  60 year old female with fall and left leg pain. EXAM: LEFT TIBIA AND FIBULA - 2 VIEW; LEFT FOOT - COMPLETE 3+ VIEW; LEFT FEMUR 2 VIEWS  COMPARISON:  Pelvic radiograph dated 08/06/2015 FINDINGS: There is a transverse fracture of the left femoral proximal diaphysis with mild medial angulation and impaction. No other acute fracture identified. There is no dislocation. There is bony expansion of the pelvis with trabecular coarsening compatible with Paget's disease. There is also pagetoid changes of the  tibia with cortical thickening and bowing of the bone. The soft tissues appear unremarkable IMPRESSION: Mildly angulated and impacted fracture of the proximal left femoral diaphysis. Pagetoid changes of the pelvic bone and tibia. Electronically Signed   By: Anner Crete M.D.   On: 08/06/2015 22:40   Dg Femur Min 2 Views Left  08/06/2015  CLINICAL DATA:  60 year old female with fall and left leg pain. EXAM: LEFT TIBIA AND FIBULA - 2 VIEW; LEFT FOOT - COMPLETE 3+ VIEW; LEFT FEMUR 2 VIEWS COMPARISON:  Pelvic radiograph dated 08/06/2015 FINDINGS: There is a transverse fracture of the left femoral proximal diaphysis with mild medial angulation and impaction. No other acute fracture identified. There is no dislocation. There is bony expansion of the pelvis with trabecular coarsening compatible with Paget's disease. There is also pagetoid changes of the tibia with cortical thickening and bowing of the bone. The soft tissues appear unremarkable IMPRESSION: Mildly angulated and impacted fracture of the proximal left femoral diaphysis. Pagetoid changes of the pelvic bone and tibia. Electronically Signed   By: Anner Crete M.D.   On: 08/06/2015 22:40   I have personally reviewed and evaluated these images and lab results as part of my medical decision-making.   MDM   Final diagnoses:  Fall from slip, trip, or stumble, initial encounter  Closed fracture of left femur, unspecified fracture morphology, unspecified portion of femur, initial encounter [facility])   Mittie Bodo presents after fall down 2 stairs earlier this evening. Patient with left hip  pain and questionable deformity. Rotation and shortening of the left lower extremity.  11:33 PM X-ray shows probably angulated impacted fracture of the proximal left femoral diaphysis.  Pt does not have an orthopedist.    11:52 PM Discussed with Dr. Rhona Raider who will evaluate in the AM for surgery.  Hospitalist to admit.    12:39 AM Dr. Olevia Bowens to admit.    I personally performed the services described in this documentation, which was scribed in my presence. The recorded information has been reviewed and is accurate.   Jarrett Soho Rishabh Rinkenberger, PA-C 08/07/15 0214  Daleen Bo, MD 08/07/15 [phone]

## 2015-08-07 ENCOUNTER — Encounter (HOSPITAL_COMMUNITY): Payer: Self-pay | Admitting: Internal Medicine

## 2015-08-07 ENCOUNTER — Inpatient Hospital Stay (HOSPITAL_COMMUNITY): Payer: Medicaid Other | Admitting: Anesthesiology

## 2015-08-07 ENCOUNTER — Other Ambulatory Visit: Payer: Self-pay

## 2015-08-07 ENCOUNTER — Encounter (HOSPITAL_COMMUNITY)
Admission: EM | Disposition: A | Discharge status: Skilled Nursing Facility | Payer: Self-pay | Source: Home / Self Care | Attending: Internal Medicine

## 2015-08-07 ENCOUNTER — Other Ambulatory Visit (HOSPITAL_COMMUNITY): Payer: Self-pay

## 2015-08-07 ENCOUNTER — Inpatient Hospital Stay (HOSPITAL_COMMUNITY): Payer: Medicaid Other

## 2015-08-07 DIAGNOSIS — D649 Anemia, unspecified: Secondary | ICD-10-CM | POA: Diagnosis not present

## 2015-08-07 DIAGNOSIS — M25552 Pain in left hip: Secondary | ICD-10-CM | POA: Diagnosis present

## 2015-08-07 DIAGNOSIS — D539 Nutritional anemia, unspecified: Secondary | ICD-10-CM | POA: Diagnosis not present

## 2015-08-07 DIAGNOSIS — G893 Neoplasm related pain (acute) (chronic): Secondary | ICD-10-CM | POA: Diagnosis not present

## 2015-08-07 DIAGNOSIS — R58 Hemorrhage, not elsewhere classified: Secondary | ICD-10-CM | POA: Diagnosis not present

## 2015-08-07 DIAGNOSIS — J438 Other emphysema: Secondary | ICD-10-CM | POA: Diagnosis not present

## 2015-08-07 DIAGNOSIS — G8929 Other chronic pain: Secondary | ICD-10-CM | POA: Diagnosis present

## 2015-08-07 DIAGNOSIS — C3491 Malignant neoplasm of unspecified part of right bronchus or lung: Secondary | ICD-10-CM | POA: Diagnosis present

## 2015-08-07 DIAGNOSIS — I739 Peripheral vascular disease, unspecified: Secondary | ICD-10-CM | POA: Diagnosis present

## 2015-08-07 DIAGNOSIS — T451X5A Adverse effect of antineoplastic and immunosuppressive drugs, initial encounter: Secondary | ICD-10-CM | POA: Diagnosis present

## 2015-08-07 DIAGNOSIS — R233 Spontaneous ecchymoses: Secondary | ICD-10-CM | POA: Diagnosis not present

## 2015-08-07 DIAGNOSIS — M360 Dermato(poly)myositis in neoplastic disease: Secondary | ICD-10-CM | POA: Diagnosis present

## 2015-08-07 DIAGNOSIS — Z923 Personal history of irradiation: Secondary | ICD-10-CM | POA: Diagnosis not present

## 2015-08-07 DIAGNOSIS — Z833 Family history of diabetes mellitus: Secondary | ICD-10-CM | POA: Diagnosis not present

## 2015-08-07 DIAGNOSIS — S72392A Other fracture of shaft of left femur, initial encounter for closed fracture: Secondary | ICD-10-CM | POA: Diagnosis not present

## 2015-08-07 DIAGNOSIS — M542 Cervicalgia: Secondary | ICD-10-CM | POA: Diagnosis present

## 2015-08-07 DIAGNOSIS — R791 Abnormal coagulation profile: Secondary | ICD-10-CM | POA: Diagnosis not present

## 2015-08-07 DIAGNOSIS — C3492 Malignant neoplasm of unspecified part of left bronchus or lung: Secondary | ICD-10-CM | POA: Diagnosis present

## 2015-08-07 DIAGNOSIS — R0902 Hypoxemia: Secondary | ICD-10-CM | POA: Diagnosis not present

## 2015-08-07 DIAGNOSIS — Z931 Gastrostomy status: Secondary | ICD-10-CM | POA: Diagnosis not present

## 2015-08-07 DIAGNOSIS — I10 Essential (primary) hypertension: Secondary | ICD-10-CM | POA: Diagnosis present

## 2015-08-07 DIAGNOSIS — S72302A Unspecified fracture of shaft of left femur, initial encounter for closed fracture: Secondary | ICD-10-CM | POA: Diagnosis not present

## 2015-08-07 DIAGNOSIS — D638 Anemia in other chronic diseases classified elsewhere: Secondary | ICD-10-CM | POA: Diagnosis present

## 2015-08-07 DIAGNOSIS — S7222XA Displaced subtrochanteric fracture of left femur, initial encounter for closed fracture: Secondary | ICD-10-CM | POA: Diagnosis present

## 2015-08-07 DIAGNOSIS — D62 Acute posthemorrhagic anemia: Secondary | ICD-10-CM | POA: Diagnosis not present

## 2015-08-07 DIAGNOSIS — J9819 Other pulmonary collapse: Secondary | ICD-10-CM | POA: Diagnosis not present

## 2015-08-07 DIAGNOSIS — Z8249 Family history of ischemic heart disease and other diseases of the circulatory system: Secondary | ICD-10-CM | POA: Diagnosis not present

## 2015-08-07 DIAGNOSIS — K279 Peptic ulcer, site unspecified, unspecified as acute or chronic, without hemorrhage or perforation: Secondary | ICD-10-CM | POA: Diagnosis present

## 2015-08-07 DIAGNOSIS — W010XXA Fall on same level from slipping, tripping and stumbling without subsequent striking against object, initial encounter: Secondary | ICD-10-CM | POA: Diagnosis present

## 2015-08-07 DIAGNOSIS — D6481 Anemia due to antineoplastic chemotherapy: Secondary | ICD-10-CM | POA: Diagnosis present

## 2015-08-07 DIAGNOSIS — J439 Emphysema, unspecified: Secondary | ICD-10-CM | POA: Diagnosis present

## 2015-08-07 DIAGNOSIS — M889 Osteitis deformans of unspecified bone: Secondary | ICD-10-CM | POA: Diagnosis present

## 2015-08-07 DIAGNOSIS — Z808 Family history of malignant neoplasm of other organs or systems: Secondary | ICD-10-CM | POA: Diagnosis not present

## 2015-08-07 DIAGNOSIS — Z87891 Personal history of nicotine dependence: Secondary | ICD-10-CM | POA: Diagnosis not present

## 2015-08-07 DIAGNOSIS — W109XXA Fall (on) (from) unspecified stairs and steps, initial encounter: Secondary | ICD-10-CM | POA: Diagnosis present

## 2015-08-07 HISTORY — PX: INTRAMEDULLARY (IM) NAIL INTERTROCHANTERIC: SHX5875

## 2015-08-07 LAB — CBC
HEMATOCRIT: 30.3 % — AB (ref 36.0–46.0)
Hemoglobin: 9.7 g/dL — ABNORMAL LOW (ref 12.0–15.0)
MCH: 29.9 pg (ref 26.0–34.0)
MCHC: 32 g/dL (ref 30.0–36.0)
MCV: 93.5 fL (ref 78.0–100.0)
Platelets: 321 10*3/uL (ref 150–400)
RBC: 3.24 MIL/uL — ABNORMAL LOW (ref 3.87–5.11)
RDW: 18.8 % — AB (ref 11.5–15.5)
WBC: 12.4 10*3/uL — ABNORMAL HIGH (ref 4.0–10.5)

## 2015-08-07 LAB — COMPREHENSIVE METABOLIC PANEL
ALT: 21 U/L (ref 14–54)
ANION GAP: 5 (ref 5–15)
AST: 54 U/L — ABNORMAL HIGH (ref 15–41)
Albumin: 3 g/dL — ABNORMAL LOW (ref 3.5–5.0)
Alkaline Phosphatase: 68 U/L (ref 38–126)
BILIRUBIN TOTAL: 0.4 mg/dL (ref 0.3–1.2)
BUN: 32 mg/dL — AB (ref 6–20)
CO2: 29 mmol/L (ref 22–32)
Calcium: 8.7 mg/dL — ABNORMAL LOW (ref 8.9–10.3)
Chloride: 103 mmol/L (ref 101–111)
Creatinine, Ser: 0.75 mg/dL (ref 0.44–1.00)
Glucose, Bld: 100 mg/dL — ABNORMAL HIGH (ref 65–99)
POTASSIUM: 4.2 mmol/L (ref 3.5–5.1)
Sodium: 137 mmol/L (ref 135–145)
TOTAL PROTEIN: 6 g/dL — AB (ref 6.5–8.1)

## 2015-08-07 LAB — SURGICAL PCR SCREEN
MRSA, PCR: NEGATIVE
STAPHYLOCOCCUS AUREUS: NEGATIVE

## 2015-08-07 SURGERY — FIXATION, FRACTURE, INTERTROCHANTERIC, WITH INTRAMEDULLARY ROD
Anesthesia: Monitor Anesthesia Care | Site: Hip | Laterality: Left

## 2015-08-07 MED ORDER — ONDANSETRON HCL 4 MG PO TABS: 4.0000 mg | ORAL_TABLET | Freq: Four times a day (QID) | ORAL | Status: DC | PRN

## 2015-08-07 MED ORDER — PHENYLEPHRINE HCL 10 MG/ML IJ SOLN
10.0000 mg | INTRAVENOUS | Status: DC | PRN
  Administered 2015-08-07: 15 ug/min via INTRAVENOUS

## 2015-08-07 MED ORDER — CEFAZOLIN SODIUM-DEXTROSE 2-4 GM/100ML-% IV SOLN
2.0000 g | Freq: Four times a day (QID) | INTRAVENOUS | Status: AC
  Administered 2015-08-07: 2 g via INTRAVENOUS
  Filled 2015-08-07: qty 100

## 2015-08-07 MED ORDER — CETYLPYRIDINIUM CHLORIDE 0.05 % MT LIQD
7.0000 mL | Freq: Two times a day (BID) | OROMUCOSAL | Status: DC
  Administered 2015-08-08 – 2015-08-13 (×11): 7 mL via OROMUCOSAL

## 2015-08-07 MED ORDER — HYDROMORPHONE HCL 1 MG/ML IJ SOLN
1.0000 mg | INTRAMUSCULAR | Status: DC | PRN
  Administered 2015-08-07 – 2015-08-13 (×26): 1 mg via INTRAVENOUS
  Filled 2015-08-07 (×26): qty 1

## 2015-08-07 MED ORDER — HYDROCODONE-ACETAMINOPHEN 5-325 MG PO TABS
1.0000 | ORAL_TABLET | Freq: Four times a day (QID) | ORAL | Status: DC | PRN
  Administered 2015-08-07 – 2015-08-08 (×2): 1 via ORAL
  Administered 2015-08-08: 2 via ORAL
  Administered 2015-08-08: 1 via ORAL
  Administered 2015-08-09 – 2015-08-13 (×4): 2 via ORAL
  Filled 2015-08-07 (×6): qty 2
  Filled 2015-08-07 (×2): qty 1

## 2015-08-07 MED ORDER — ONDANSETRON HCL 4 MG/2ML IJ SOLN
4.0000 mg | Freq: Three times a day (TID) | INTRAMUSCULAR | Status: AC | PRN
Start: 2015-08-07 — End: 2015-08-07

## 2015-08-07 MED ORDER — PROPOFOL 500 MG/50ML IV EMUL
INTRAVENOUS | Status: DC | PRN
  Administered 2015-08-07: 50 ug/kg/min via INTRAVENOUS
  Administered 2015-08-07: 75 ug/kg/min via INTRAVENOUS

## 2015-08-07 MED ORDER — PHENYLEPHRINE 40 MCG/ML (10ML) SYRINGE FOR IV PUSH (FOR BLOOD PRESSURE SUPPORT)
PREFILLED_SYRINGE | INTRAVENOUS | Status: AC
Start: 2015-08-07 — End: 2015-08-07
  Filled 2015-08-07: qty 10

## 2015-08-07 MED ORDER — SODIUM CHLORIDE 0.9 % IV SOLN
INTRAVENOUS | Status: DC
  Administered 2015-08-07 (×2): via INTRAVENOUS

## 2015-08-07 MED ORDER — PHENYLEPHRINE HCL 10 MG/ML IJ SOLN
INTRAMUSCULAR | Status: DC | PRN
  Administered 2015-08-07 (×3): 80 ug via INTRAVENOUS

## 2015-08-07 MED ORDER — ONDANSETRON HCL 4 MG/2ML IJ SOLN
INTRAMUSCULAR | Status: DC | PRN
  Administered 2015-08-07: 4 mg via INTRAVENOUS

## 2015-08-07 MED ORDER — PHENYLEPHRINE HCL 10 MG/ML IJ SOLN
INTRAMUSCULAR | Status: AC
  Filled 2015-08-07: qty 1

## 2015-08-07 MED ORDER — ALBUMIN HUMAN 5 % IV SOLN
INTRAVENOUS | Status: DC | PRN
  Administered 2015-08-07: 14:00:00 via INTRAVENOUS

## 2015-08-07 MED ORDER — HYDROMORPHONE HCL 1 MG/ML IJ SOLN: 0.2500 mg | INTRAMUSCULAR | Status: DC | PRN

## 2015-08-07 MED ORDER — ACETAMINOPHEN 650 MG RE SUPP: 650.0000 mg | Freq: Four times a day (QID) | RECTAL | Status: DC | PRN

## 2015-08-07 MED ORDER — ONDANSETRON HCL 4 MG/2ML IJ SOLN: 4.0000 mg | Freq: Four times a day (QID) | INTRAMUSCULAR | Status: DC | PRN

## 2015-08-07 MED ORDER — PROPOFOL 10 MG/ML IV BOLUS
INTRAVENOUS | Status: AC
  Filled 2015-08-07: qty 20

## 2015-08-07 MED ORDER — ONDANSETRON HCL 4 MG/2ML IJ SOLN
INTRAMUSCULAR | Status: AC
  Filled 2015-08-07: qty 2

## 2015-08-07 MED ORDER — FENTANYL CITRATE (PF) 100 MCG/2ML IJ SOLN
INTRAMUSCULAR | Status: AC
  Filled 2015-08-07: qty 2

## 2015-08-07 MED ORDER — SODIUM CHLORIDE 0.9% FLUSH
10.0000 mL | Freq: Two times a day (BID) | INTRAVENOUS | Status: DC
  Administered 2015-08-08 – 2015-08-12 (×6): 10 mL

## 2015-08-07 MED ORDER — ASPIRIN EC 325 MG PO TBEC
325.0000 mg | DELAYED_RELEASE_TABLET | Freq: Every day | ORAL | Status: DC
  Administered 2015-08-08 – 2015-08-10 (×3): 325 mg via ORAL
  Filled 2015-08-07 (×3): qty 1

## 2015-08-07 MED ORDER — BUPIVACAINE IN DEXTROSE 0.75-8.25 % IT SOLN
INTRATHECAL | Status: DC | PRN
  Administered 2015-08-07: 1.5 mL via INTRATHECAL

## 2015-08-07 MED ORDER — MIDAZOLAM HCL 2 MG/2ML IJ SOLN
INTRAMUSCULAR | Status: AC
  Filled 2015-08-07: qty 2

## 2015-08-07 MED ORDER — HEPARIN SODIUM (PORCINE) 5000 UNIT/ML IJ SOLN
5000.0000 [IU] | Freq: Three times a day (TID) | INTRAMUSCULAR | Status: DC
  Administered 2015-08-07 – 2015-08-10 (×9): 5000 [IU] via SUBCUTANEOUS
  Filled 2015-08-07 (×9): qty 1

## 2015-08-07 MED ORDER — DEXAMETHASONE SODIUM PHOSPHATE 10 MG/ML IJ SOLN
INTRAMUSCULAR | Status: DC | PRN
  Administered 2015-08-07: 10 mg via INTRAVENOUS

## 2015-08-07 MED ORDER — SUCRALFATE 1 GM/10ML PO SUSP
1.0000 g | Freq: Four times a day (QID) | ORAL | Status: DC
  Administered 2015-08-07 – 2015-08-13 (×23): 1 g via ORAL
  Filled 2015-08-07 (×24): qty 10

## 2015-08-07 MED ORDER — LACTATED RINGERS IV SOLN: INTRAVENOUS | Status: DC

## 2015-08-07 MED ORDER — SODIUM CHLORIDE 0.9% FLUSH
10.0000 mL | INTRAVENOUS | Status: DC | PRN
  Administered 2015-08-08 – 2015-08-13 (×3): 10 mL
  Filled 2015-08-07 (×3): qty 40

## 2015-08-07 MED ORDER — JEVITY 1.2 CAL PO LIQD
355.0000 mL | Freq: Three times a day (TID) | ORAL | Status: DC
  Administered 2015-08-07 – 2015-08-13 (×23): 355 mL
  Filled 2015-08-07 (×28): qty 474

## 2015-08-07 MED ORDER — ALBUMIN HUMAN 5 % IV SOLN
INTRAVENOUS | Status: AC
  Filled 2015-08-07: qty 250

## 2015-08-07 MED ORDER — LIDOCAINE HCL (CARDIAC) 20 MG/ML IV SOLN
INTRAVENOUS | Status: AC
  Filled 2015-08-07: qty 5

## 2015-08-07 MED ORDER — PROMETHAZINE HCL 25 MG/ML IJ SOLN: 6.2500 mg | INTRAMUSCULAR | Status: DC | PRN

## 2015-08-07 MED ORDER — FENTANYL CITRATE (PF) 100 MCG/2ML IJ SOLN
INTRAMUSCULAR | Status: DC | PRN
  Administered 2015-08-07 (×2): 25 ug via INTRAVENOUS
  Administered 2015-08-07: 50 ug via INTRAVENOUS

## 2015-08-07 MED ORDER — HYDROCORTISONE NA SUCCINATE PF 100 MG IJ SOLR
INTRAMUSCULAR | Status: AC
  Filled 2015-08-07: qty 2

## 2015-08-07 MED ORDER — METHOCARBAMOL 500 MG PO TABS
500.0000 mg | ORAL_TABLET | Freq: Four times a day (QID) | ORAL | Status: DC | PRN
  Administered 2015-08-08 (×2): 500 mg via ORAL
  Filled 2015-08-07 (×2): qty 1

## 2015-08-07 MED ORDER — CEFAZOLIN SODIUM-DEXTROSE 2-3 GM-% IV SOLR
INTRAVENOUS | Status: DC | PRN
  Administered 2015-08-07: 2 g via INTRAVENOUS

## 2015-08-07 MED ORDER — MENTHOL 3 MG MT LOZG: 1.0000 | LOZENGE | OROMUCOSAL | Status: DC | PRN

## 2015-08-07 MED ORDER — HYDROCORTISONE NA SUCCINATE PF 1000 MG IJ SOLR
INTRAMUSCULAR | Status: DC | PRN
  Administered 2015-08-07: 100 mg via INTRAVENOUS

## 2015-08-07 MED ORDER — DEXAMETHASONE SODIUM PHOSPHATE 10 MG/ML IJ SOLN
INTRAMUSCULAR | Status: AC
  Filled 2015-08-07: qty 1

## 2015-08-07 MED ORDER — SODIUM CHLORIDE 0.9% FLUSH
3.0000 mL | Freq: Two times a day (BID) | INTRAVENOUS | Status: DC
  Administered 2015-08-08 – 2015-08-12 (×4): 3 mL via INTRAVENOUS

## 2015-08-07 MED ORDER — METHOCARBAMOL 1000 MG/10ML IJ SOLN
500.0000 mg | Freq: Four times a day (QID) | INTRAVENOUS | Status: DC | PRN
  Administered 2015-08-07: 500 mg via INTRAVENOUS
  Filled 2015-08-07 (×3): qty 5

## 2015-08-07 MED ORDER — ACETAMINOPHEN 325 MG PO TABS: 650.0000 mg | ORAL_TABLET | Freq: Four times a day (QID) | ORAL | Status: DC | PRN

## 2015-08-07 MED ORDER — PANTOPRAZOLE SODIUM 40 MG PO PACK
40.0000 mg | PACK | Freq: Two times a day (BID) | ORAL | Status: DC
  Administered 2015-08-07 – 2015-08-13 (×12): 40 mg
  Filled 2015-08-07 (×18): qty 20

## 2015-08-07 MED ORDER — HYDROMORPHONE HCL 1 MG/ML IJ SOLN
0.5000 mg | Freq: Once | INTRAMUSCULAR | Status: AC
  Administered 2015-08-07: 0.5 mg via INTRAVENOUS
  Filled 2015-08-07: qty 1

## 2015-08-07 MED ORDER — LACTATED RINGERS IV SOLN
INTRAVENOUS | Status: DC | PRN
  Administered 2015-08-07 (×2): via INTRAVENOUS

## 2015-08-07 MED ORDER — 0.9 % SODIUM CHLORIDE (POUR BTL) OPTIME
TOPICAL | Status: DC | PRN
  Administered 2015-08-07: 1000 mL

## 2015-08-07 MED ORDER — PHENOL 1.4 % MT LIQD
1.0000 | OROMUCOSAL | Status: DC | PRN
Start: 2015-08-07 — End: 2015-08-13

## 2015-08-07 MED ORDER — CEFAZOLIN SODIUM-DEXTROSE 2-4 GM/100ML-% IV SOLN
INTRAVENOUS | Status: AC
  Filled 2015-08-07: qty 100

## 2015-08-07 MED ORDER — CHLORHEXIDINE GLUCONATE 0.12 % MT SOLN
15.0000 mL | Freq: Two times a day (BID) | OROMUCOSAL | Status: DC
  Administered 2015-08-07 – 2015-08-13 (×10): 15 mL via OROMUCOSAL
  Filled 2015-08-07 (×11): qty 15

## 2015-08-07 MED ORDER — MIDAZOLAM HCL 5 MG/5ML IJ SOLN
INTRAMUSCULAR | Status: DC | PRN
  Administered 2015-08-07 (×2): 1 mg via INTRAVENOUS

## 2015-08-07 MED ORDER — METOCLOPRAMIDE HCL 5 MG/ML IJ SOLN: 5.0000 mg | Freq: Three times a day (TID) | INTRAMUSCULAR | Status: DC | PRN

## 2015-08-07 MED ORDER — PROPOFOL 10 MG/ML IV BOLUS
INTRAVENOUS | Status: DC | PRN
  Administered 2015-08-07 (×3): 20 mg via INTRAVENOUS

## 2015-08-07 MED ORDER — HYDROCORTISONE NA SUCCINATE PF 100 MG IJ SOLR
100.0000 mg | Freq: Three times a day (TID) | INTRAMUSCULAR | Status: DC
  Administered 2015-08-07 – 2015-08-10 (×9): 100 mg via INTRAVENOUS
  Filled 2015-08-07 (×9): qty 2

## 2015-08-07 MED ORDER — PROPOFOL 10 MG/ML IV BOLUS
INTRAVENOUS | Status: AC
Start: 2015-08-07 — End: 2015-08-07
  Filled 2015-08-07: qty 20

## 2015-08-07 MED ORDER — BENZONATATE 100 MG PO CAPS: 100.0000 mg | ORAL_CAPSULE | Freq: Three times a day (TID) | ORAL | Status: DC | PRN

## 2015-08-07 MED ORDER — METOCLOPRAMIDE HCL 5 MG PO TABS
5.0000 mg | ORAL_TABLET | Freq: Three times a day (TID) | ORAL | Status: DC | PRN
  Filled 2015-08-07: qty 2

## 2015-08-07 SURGICAL SUPPLY — 35 items
BAG SPEC THK2 15X12 ZIP CLS (MISCELLANEOUS)
BAG ZIPLOCK 12X15 (MISCELLANEOUS) IMPLANT
BIT DRILL 4.3MMS DISTAL GRDTED (BIT) IMPLANT
BLADE SURG 15 STRL LF DISP TIS (BLADE) ×1 IMPLANT
BLADE SURG 15 STRL SS (BLADE) ×3
BNDG GAUZE ELAST 4 BULKY (GAUZE/BANDAGES/DRESSINGS) ×3 IMPLANT
DRAPE STERI IOBAN 125X83 (DRAPES) ×3 IMPLANT
DRILL 4.3MMS DISTAL GRADUATED (BIT) ×3
DRSG AQUACEL AG ADV 3.5X 4 (GAUZE/BANDAGES/DRESSINGS) ×6 IMPLANT
DRSG AQUACEL AG ADV 3.5X 6 (GAUZE/BANDAGES/DRESSINGS) ×2 IMPLANT
DRSG AQUACEL AG ADV 3.5X10 (GAUZE/BANDAGES/DRESSINGS) ×2 IMPLANT
DURAPREP 26ML APPLICATOR (WOUND CARE) ×4 IMPLANT
ELECT REM PT RETURN 9FT ADLT (ELECTROSURGICAL) ×3
ELECTRODE REM PT RTRN 9FT ADLT (ELECTROSURGICAL) ×1 IMPLANT
GLOVE BIO SURGEON STRL SZ8 (GLOVE) ×6 IMPLANT
GLOVE BIOGEL PI IND STRL 8 (GLOVE) ×2 IMPLANT
GLOVE BIOGEL PI INDICATOR 8 (GLOVE) ×4
GOWN STRL REUS W/TWL XL LVL3 (GOWN DISPOSABLE) ×6 IMPLANT
GUIDEPIN 3.2X17.5 THRD DISP (PIN) ×2 IMPLANT
GUIDEWIRE BALL NOSE 80CM (WIRE) ×2 IMPLANT
HFN LH 130 DEG 9MM X 360MM (Nail) ×2 IMPLANT
HIP FRA NAIL LAG SCREW 10.5X90 (Orthopedic Implant) ×3 IMPLANT
KIT BASIN OR (CUSTOM PROCEDURE TRAY) ×3 IMPLANT
MANIFOLD NEPTUNE II (INSTRUMENTS) ×3 IMPLANT
NS IRRIG 1000ML POUR BTL (IV SOLUTION) ×2 IMPLANT
PACK GENERAL/GYN (CUSTOM PROCEDURE TRAY) ×3 IMPLANT
POSITIONER SURGICAL ARM (MISCELLANEOUS) ×3 IMPLANT
SCREW BONE CORTICAL 5.0X42 (Screw) ×2 IMPLANT
SCREW LAG HIP FRA NAIL 10.5X90 (Orthopedic Implant) IMPLANT
SCREWDRIVER HEX TIP 3.5MM (MISCELLANEOUS) ×2 IMPLANT
STAPLER VISISTAT 35W (STAPLE) ×3 IMPLANT
SUT VIC AB 0 CT1 27 (SUTURE)
SUT VIC AB 0 CT1 27XBRD ANTBC (SUTURE) IMPLANT
SUT VIC AB 2-0 CT1 27 (SUTURE) ×3
SUT VIC AB 2-0 CT1 TAPERPNT 27 (SUTURE) ×1 IMPLANT

## 2015-08-07 NOTE — H&P (Signed)
History and Physical    [name] YQM:[id number] DOB: [birth year] DOA: 08/06/2015  PCP: Rosita Fire, MD   Patient coming from: Home.  Chief Complaint: Left hip pain after fall.  HPI: Megan Sims is a 60 y.o. 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  Per patient, she was walking up the stairs when she felt that her right lower extremity "buckled and gave out", then she felt that she twisted to her left, heard and felt a pop, then fell and slipped down the stairs. She denies dizziness, chest pain, dyspnea, diaphoresis at the time of the fall. She denies head trauma or losing consciousness during the fall.   ED Course: The patient was given analgesics, antiemetics and supplemental oxygen after becoming temporarily hypoxic at 87% following Dilaudid administration. Workup shows left femoral shaft fracture. Orthopedic surgery was consulted and requested bed at this facility and medicine consult.  Review of Systems: As per HPI otherwise 10 point review of systems negative.  Past Medical History  Diagnosis Date  . Migraine   . Lung abnormality   . Hypertension   . Pleurisy   . Cerebral aneurysm 2 brain surgeries 96 or 97  . Paget disease of bone   . Diarrhea   . Renal insufficiency     Patient states " no kidney problems  . S/P biopsy     of throat per patient.  . Oral thrush 09/24/2014  . Nicotine dependence 09/24/2014  . Chronic neck pain 09/24/2014  . Skin ulcer ([place]) 11/18/2014  . Emphysema of lung ([place]) 05/05/2015  . Malignant neoplasm of unknown origin ([place])   . Cancer [facility])     malignant neoplasm of unknown origin  . Left knee pain     . Left leg weakness 06/2015    Past Surgical History  Procedure Laterality Date  . Chest tube insertion    . Tubal ligation    . Cerebral aneurysm repair  96 or 97  . Colonoscopy      10 years ago in [place]   . Esophagogastroduodenoscopy  06/16/2015    Dr. Gala Romney: edentulous cricopharyngeus, esophageal plaques, biopsy consistent with candida  . Laryngoscopy  Jun 22, 2015    [facility], Dr. Erik Obey: normal oropharynx, no lesions, mobile vocal cords with good airway.  . Esophagogastroduodenoscopy N/A 06/16/2015    Procedure: ESOPHAGOGASTRODUODENOSCOPY (EGD);  Surgeon: Daneil Dolin, MD;  [facility];  Service: Endoscopy;  Laterality: N/A;  . Peg placement N/A 07/06/2015    Procedure: PERCUTANEOUS ENDOSCOPIC GASTROSTOMY (PEG) PLACEMENT;  Surgeon: Danie Binder, MD;  Location: [facility];  Service: Endoscopy;  Laterality: N/A;  . Esophagogastroduodenoscopy N/A 07/06/2015    Procedure: ESOPHAGOGASTRODUODENOSCOPY (EGD);  Surgeon: Danie Binder, MD;  Location: [facility];  Service: Endoscopy;  Laterality: N/A;     reports that she has quit smoking. Her smoking use included Cigarettes. She has a 17 pack-year smoking history. She has never used smokeless tobacco. She reports that she does not drink alcohol or use illicit drugs.  No Known Allergies  Family History  Problem Relation Age of Onset  . Hypertension    . Diabetes    . Kidney  disease    . Cancer Mother     throat ca  . Cancer Maternal Grandmother     thyroid ca  . Colon cancer Neg Hx     Prior to Admission medications   Medication Sig Start Date End Date Taking? Authorizing Provider  HYDROcodone-acetaminophen (HYCET) 7.5-325 mg/15 ml solution Take 15 mLs by mouth every 6 (six) hours as needed for moderate pain. 08/03/15 08/02/16 Yes Heath Lark, MD  Nutritional Supplements (FEEDING SUPPLEMENT, JEVITY 1.2 CAL,) LIQD Place 237 mLs into feeding tube every 3 (three) hours. 07/08/15  Yes Rosita Fire, MD  predniSONE  (DELTASONE) 20 MG tablet Take 3 tablets (60 mg total) by mouth daily with breakfast. 08/06/15  Yes Heath Lark, MD  amLODipine (NORVASC) 10 MG tablet Take 1 tablet (10 mg total) by mouth daily. Patient taking differently: 10 mg by Feeding Tube route daily.  11/11/14   Heath Lark, MD  omeprazole (PRILOSEC) 20 MG capsule Liquid Form 2 mg/ml.  Take 10 ml (20 mg) twice daily. 08/06/15   Heath Lark, MD  sucralfate (CARAFATE) 1 GM/10ML suspension Take 10 mLs (1 g total) by mouth 4 (four) times daily -  with meals and at bedtime. 08/06/15   Heath Lark, MD    Physical Exam: Filed Vitals:   08/06/15 2052 08/06/15 2339  BP: 159/83 132/70  Pulse: 98 90  Temp: 97.6 F (36.4 C)   TempSrc: Oral   Resp: 16 15  SpO2: 96% 87%      Constitutional: Anxious due to pain of left hip area. Filed Vitals:   08/06/15 2052 08/06/15 2339  BP: 159/83 132/70  Pulse: 98 90  Temp: 97.6 F (36.4 C)   TempSrc: Oral   Resp: 16 15  SpO2: 96% 87%   Eyes: PERRL, lids and conjunctivae normal ENMT: Mucous membranes are moist. Posterior pharynx clear of any exudate or lesions. Neck: normal, supple, no masses, no thyromegaly Respiratory: clear to auscultation bilaterally, no wheezing, no crackles. Normal respiratory effort. No accessory muscle use.  Cardiovascular: Regular rate and rhythm, no murmurs / rubs / gallops. No extremity edema. 2+ pedal pulses. No carotid bruits.  Abdomen: PEG in place,  Bowel sounds positive, no tenderness, no masses palpated. No hepatosplenomegaly..  Musculoskeletal: Positive tenderness on left hip, internal rotation and shortening of LLE.  Skin: Erythema of face and trunk, onychomycosis of toenails. Neurologic: CN 2-12 grossly intact. Sensation intact, Grossly nonfocal  Psychiatric: Normal judgment and insight. Alert and oriented x 4. Anxious due to pain.   Labs on Admission: I have personally reviewed following labs and imaging studies  CBC:  Recent Labs Lab 08/02/15 1017  08/03/15 1205 08/06/15 2121  WBC 8.9 9.5 8.6  NEUTROABS 6.0 7.1* 6.6  HGB 9.5 Repeated and Verified* 9.9* 9.5*  HCT 29.6* 30.8* 29.2*  MCV 92.8 92.8 92.4  PLT 287 305 433   Basic Metabolic Panel:  Recent Labs Lab 08/02/15 1017 08/06/15 2121  NA 139 136  K 4.2 4.3  CL  --  101  CO2 27 26  GLUCOSE 97 179*  BUN 25.4 34*  CREATININE 0.9 0.83  CALCIUM 8.9 8.9   GFR: Estimated Creatinine Clearance: 54.4 mL/min (by C-G formula based on Cr of 0.83). Liver Function Tests:  Recent Labs Lab 08/02/15 1017  AST 56*  ALT 16  ALKPHOS 78  BILITOT 0.37  PROT 6.0*  ALBUMIN 2.8*   Coagulation Profile:  Recent Labs Lab 08/06/15 2121  INR 0.99   Cardiac Enzymes:  Recent Labs Lab  08/03/15 1205  CKTOTAL 459*   Urine analysis:    Component Value Date/Time   COLORURINE YELLOW 05/24/2015 1930   APPEARANCEUR CLEAR 05/24/2015 1930   LABSPEC >1.030* 05/24/2015 1930   PHURINE 5.0 05/24/2015 1930   GLUCOSEU NEGATIVE 05/24/2015 1930   HGBUR NEGATIVE 05/24/2015 1930   BILIRUBINUR NEGATIVE 05/24/2015 1930   KETONESUR NEGATIVE 05/24/2015 1930   PROTEINUR TRACE* 05/24/2015 1930   UROBILINOGEN 0.2 10/25/2014 0021   NITRITE NEGATIVE 05/24/2015 1930   LEUKOCYTESUR TRACE* 05/24/2015 1930    Recent Results (from the past 240 hour(s))  Body fluid culture     Status: None   Collection Time: 07/30/15  5:00 PM  Result Value Ref Range Status   Specimen Description SYNOVIAL LEFT KNEE  Final   Special Requests NONE  Final   Gram Stain   Final    FEW WBC PRESENT,BOTH PMN AND MONONUCLEAR NO ORGANISMS SEEN    Culture   Final    NO GROWTH 3 DAYS Performed at [facility]    Report Status 08/03/2015 FINAL  Final  TECHNOLOGIST REVIEW     Status: None   Collection Time: 08/02/15 10:17 AM  Result Value Ref Range Status   Technologist Review few targets  Final     Radiological Exams on Admission: Dg Pelvis 1-2 Views  08/06/2015  CLINICAL DATA:  Left leg pain after a fall  today. EXAM: PELVIS - 1-2 VIEW COMPARISON:  None. FINDINGS: There is a transverse minimally comminuted fracture of the proximal left femoral shaft below the level of lesser trochanter. There is impaction and medial angulation of the distal fracture fragment. Diffuse coarsening of trabecular pattern in the pelvis bilaterally consistent with Paget's disease. Patches changes also probably involving the sacrum although limited visualization due to overlying bowel. Calcifications in the pelvis consistent with phleboliths. IMPRESSION: Transverse mildly comminuted fracture of the proximal left femoral shaft with medial angulation and impaction of the fracture fragments. Changes of Paget's disease in the pelvis and sacrum. Electronically Signed   By: Lucienne Capers M.D.   On: 08/06/2015 22:36   Dg Tibia/fibula Left  08/06/2015  CLINICAL DATA:  60 year old female with fall and left leg pain. EXAM: LEFT TIBIA AND FIBULA - 2 VIEW; LEFT FOOT - COMPLETE 3+ VIEW; LEFT FEMUR 2 VIEWS COMPARISON:  Pelvic radiograph dated 08/06/2015 FINDINGS: There is a transverse fracture of the left femoral proximal diaphysis with mild medial angulation and impaction. No other acute fracture identified. There is no dislocation. There is bony expansion of the pelvis with trabecular coarsening compatible with Paget's disease. There is also pagetoid changes of the tibia with cortical thickening and bowing of the bone. The soft tissues appear unremarkable IMPRESSION: Mildly angulated and impacted fracture of the proximal left femoral diaphysis. Pagetoid changes of the pelvic bone and tibia. Electronically Signed   By: Anner Crete M.D.   On: 08/06/2015 22:40   Dg Chest Port 1 View  08/07/2015  CLINICAL DATA:  Preoperative. History of lung abnormality, hypertension, emphysema, former smoker. EXAM: PORTABLE CHEST 1 VIEW COMPARISON:  CT chest 08/02/2015.  Chest 06/14/2015. FINDINGS: 11 mm diameter spiculated nodule in the right mid lung. This  is smaller than the opacity seen on prior chest radiograph. Decreasing size may indicate that this is resolving inflammatory process. However, based on morphology the lesion is indeterminate and malignancy should be excluded. Left lung is clear. Power port type central venous catheter with tip over the mid SVC region. Normal heart size and pulmonary vascularity. Calcified  and tortuous aorta. IMPRESSION: 11 mm spiculated nodule in the right mid lung. The opacity is smaller than on prior study, possibly indicating resolving inflammatory process. Malignancy should still be excluded however. Follow-up until resolution is recommended. Electronically Signed   By: Lucienne Capers M.D.   On: 08/07/2015 00:21   Dg Foot Complete Left  08/06/2015  CLINICAL DATA:  60 year old female with fall and left leg pain. EXAM: LEFT TIBIA AND FIBULA - 2 VIEW; LEFT FOOT - COMPLETE 3+ VIEW; LEFT FEMUR 2 VIEWS COMPARISON:  Pelvic radiograph dated 08/06/2015 FINDINGS: There is a transverse fracture of the left femoral proximal diaphysis with mild medial angulation and impaction. No other acute fracture identified. There is no dislocation. There is bony expansion of the pelvis with trabecular coarsening compatible with Paget's disease. There is also pagetoid changes of the tibia with cortical thickening and bowing of the bone. The soft tissues appear unremarkable IMPRESSION: Mildly angulated and impacted fracture of the proximal left femoral diaphysis. Pagetoid changes of the pelvic bone and tibia. Electronically Signed   By: Anner Crete M.D.   On: 08/06/2015 22:40   Dg Femur Min 2 Views Left  08/06/2015  CLINICAL DATA:  60 year old female with fall and left leg pain. EXAM: LEFT TIBIA AND FIBULA - 2 VIEW; LEFT FOOT - COMPLETE 3+ VIEW; LEFT FEMUR 2 VIEWS COMPARISON:  Pelvic radiograph dated 08/06/2015 FINDINGS: There is a transverse fracture of the left femoral proximal diaphysis with mild medial angulation and impaction. No other  acute fracture identified. There is no dislocation. There is bony expansion of the pelvis with trabecular coarsening compatible with Paget's disease. There is also pagetoid changes of the tibia with cortical thickening and bowing of the bone. The soft tissues appear unremarkable IMPRESSION: Mildly angulated and impacted fracture of the proximal left femoral diaphysis. Pagetoid changes of the pelvic bone and tibia. Electronically Signed   By: Anner Crete M.D.   On: 08/06/2015 22:40  Echocardiogram 10/25/2014  Indications: Chest pain 786.51.  ------------------------------------------------------------------- History: PMH: Chemotherapy. Risk factors: Hypertension.  ------------------------------------------------------------------- Study Conclusions  - Left ventricle: The cavity size was normal. Wall thickness was  increased in a pattern of mild LVH. Systolic function was normal.  The estimated ejection fraction was in the range of 55% to 60%.  Wall motion was normal; there were no regional wall motion  abnormalities. Doppler parameters are consistent with abnormal  left ventricular relaxation (grade 1 diastolic dysfunction). - Aortic valve: There was trivial regurgitation. - Mitral valve: Calcified annulus.  EKG: Independently reviewed.  Vent. rate 91 BPM PR interval * ms QRS duration 84 ms QT/QTc 401/494 ms P-R-T axes 70 55 62 Sinus rhythm Anteroseptal infarct, old  Assessment/Plan Principal Problem:   Left femoral shaft fracture (HCC)   Fall from slip, trip, or stumble   Paget's disease of bone Admit to telemetry/inpatient. Continue supplemental oxygen. Analgesics and antiemetics as needed. Keep nothing by mouth Buck's traction per orthopedic surgery. Dr. Sharol Given was consulted by the emergency department and will evaluate in the morning.  Active Problems:   Essential hypertension Hold antihypertensives for now. Monitor blood pressure closely.     Anemia due to antineoplastic chemotherapy Monitor hematocrit and hemoglobin.    Emphysema of lung ([place]) Stable.  Continue supplemental oxygen. Bronchodilators as needed.    Bilateral lung cancer ([place]) Continue management per oncology.       DVT prophylaxis: Heparin subcutaneous. Code Status: Full code. Family Communication: About 7 or 8 of her relatives were present in the  room including her                                                 husband and kids. Disposition Plan: Admit for pain/medical management and orthopedic surgery evaluation later                                today. Consults called: Orthopedic surgery (Dr. Sharol Given) Admission status: Inpatient/telemetry   Reubin Milan MD [facility] Pager [phone].  If 7PM-7AM, please contact night-coverage [email or web address] Password [email or web address]  08/07/2015, 1:17 AM

## 2015-08-07 NOTE — Op Note (Signed)
NAMEANJOLI, Megan Sims NO.:  [id number]  MEDICAL RECORD NO.:  [id number]  LOCATION:  1761                         FACILITY:  [facility]  PHYSICIAN:  Monico Blitz. Namon Villarin, M.D.DATE OF BIRTH:  1955/09/06  DATE OF PROCEDURE:  08/07/2015 DATE OF DISCHARGE:                              OPERATIVE REPORT   PREOPERATIVE DIAGNOSIS:  Left hip subtrochanteric fracture.  POSTOPERATIVE DIAGNOSIS:  Left hip subtrochanteric fracture.  PROCEDURE:  Left hip trochanteric nail.  ANESTHESIA:  Spinal and sedation.  ATTENDING SURGEON:  Monico Blitz. Rhona Raider, M.D.  ASSISTANT:  Loni Dolly, PA.  INDICATION FOR PROCEDURE:  The patient is a 60 year old woman beset with multiple medical issues including advanced lung cancer and Paget's disease.  She unfortunately fell at home yesterday and sustained a displaced subtrochanteric hip fracture.  She has extreme pain and she is offered stabilization in hopes of ameliorating her pain somewhat and allowing her to sit up in bed and potentially get out of bed once again. Unfortunately she has been wheelchair-bound for a couple weeks, so walking again may be difficult.  Informed operative consent was obtained after discussion of possible complications including reaction to anesthesia, infection, DVT, and death.  SUMMARY OF FINDINGS AND PROCEDURE:  Under spinal anesthesia and some sedation, a left hip fracture was reduced under traction and then stabilized with a Biomet AFFIXUS trochanter nail, which measured 9 x 36 and was locked both proximally and distally.  I used fluoroscopy throughout the case to make appropriate intraoperative decisions and read all of these views myself.  Loni Dolly assisted throughout and was invaluable to the completion of the case and that he passed instruments and closed simultaneously.  He also maintained reduction while I placed the hardware.  DESCRIPTION OF PROCEDURE:  The patient was taken to the operating  suite, where spinal anesthetic was applied without difficulty.  She was given some sedation and positioned on a fracture table with all bony prominences appropriately padded.  We then placed some gentle traction on the left leg.  Fluoroscopy was used to confirm adequate reduction of the fracture.  She was then prepped and draped in normal sterile fashion.  After administration of IV Kefzol and some stress steroids, an appropriate time-out was performed.  We then made a small incision above the greater trochanter, inserted a guide pin in the tip of the greater trochanter down toward the fracture site.  This was over reamed with a starter reamer.  I then placed a guidewire across the fracture site down to the knee, seen to be central on 2 fluoroscopic views distal.  We over reamed to a diameter of 11 mm followed by placement of aforementioned 9 x 36 AFFIXUS Biomet nail.  I did my best to try to hold the reduction while this was placed.  We then placed a guidewire using an attached proximal guide.  This guidewire was placed up into the center of the femoral head.  This was confirmed on 2 fluoroscopic views.  We then placed a locking screw in that location which achieved excellent purchase.  We then went distal and locked in the static hole with a single locking screw, placed  free-hand under fluoroscopic guidance. Again x-ray was used in 2 planes to confirm adequate placement of hardware and reduction of fracture.  The wounds were then all thoroughly irrigated.  It should be noted the traction was released prior to placement of the distal screw.  We then closed deep tissues with Vicryl and subcutaneous tissues with smaller Vicryl.  Skin was closed with staples.  I applied Adaptic, dry gauze, and some tape.  Estimated blood loss and fluids obtained from anesthesia records.  DISPOSITION:  The patient was taken to recovery room in stable condition.  She was to be admitted back to the Medicine  Service for appropriate management.  Hopefully we will be able to get her out of bed tonight or tomorrow.     Monico Blitz Rhona Raider, M.D.     PGD/MEDQ  D:  08/07/2015  T:  08/07/2015  Job:  [id number]

## 2015-08-07 NOTE — Progress Notes (Addendum)
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 Found to have a fracture of left femur. Is s/p left hip intertroch nail today.   Subjective: Left leg hurts. No nausea, vomiting, diarrhea, constipation. Has a chronic cough with brown/ green sputum which is unchanged. No dyspnea.   Assessment & Plan:   Principal Problem:   Left femoral shaft fracture - per ortho  Active Problems:   Essential hypertension - Norvasc on hold    Bilateral lung cancer  - management per Heme/onc- her case was to be discussed this week at ENT tumor board this week    Paget's disease of bone - asymptomatic with normal alk phos - continue to follow    Anemia of chronic disease - hematology following  Dermatomyositis - this is the cause of her fall and fracture - on Prednisone which was recently started- dose was increased to 60 mg daily - she has taken 2 days at this dose - stress dose steroids for now - PT eval - likely will need SNF  Dysphagia - cont PEG feeds/ free water  PUD - PPI and Sucralfate- avoid NSAIDs  Chronic neck pain - Hycet PRN- she takes it anywhere from 2- 4 x day as needed   DVT prophylaxis: per ortho Code Status: full code Family Communication:  Disposition Plan:  Consultants:   ortho Procedures:   Intertroch nail- left hip  Antimicrobials:  Anti-infectives    None       Objective: Filed Vitals:   08/07/15 1526 08/07/15 1530 08/07/15 1534 08/07/15 1535  BP:  156/80  143/87  Pulse: 72  77 76  Temp:      TempSrc:      Resp: 14 16 15    Height:      Weight:      SpO2:  100%  100% 100%    Intake/Output Summary (Last 24 hours) at 08/07/15 1546 Last data filed at 08/07/15 1456  Gross per 24 hour  Intake   1515 ml  Output    600 ml  Net    915 ml   Filed Weights   08/07/15 0243  Weight: 53.978 kg (119 lb)    Examination: General exam: Appears comfortable  HEENT: PERRLA, oral mucosa moist, no sclera icterus or thrush Respiratory system: Clear to auscultation. Respiratory effort normal. Cardiovascular system: S1 & S2 heard, RRR.  No murmurs  Gastrointestinal system: Abdomen soft, non-tender, nondistended. Normal bowel sound. No organomegaly Central nervous system: Alert and oriented. No focal neurological deficits. Extremities: No cyanosis, clubbing or edema- has swelling of left thigh Skin: erythema and swelling in various areas of the body and with vitiligo in places Psychiatry:  Mood & affect appropriate.     Data Reviewed: I have personally reviewed following labs and imaging studies  CBC:  Recent Labs Lab 08/02/15 1017 08/03/15 1205 08/06/15 2121 08/07/15 0330  WBC 8.9 9.5 8.6 12.4*  NEUTROABS 6.0 7.1* 6.6  --   HGB 9.5 Repeated and Verified* 9.9* 9.5* 9.7*  HCT 29.6* 30.8* 29.2* 30.3*  MCV 92.8 92.8 92.4 93.5  PLT 287 305 321 732   Basic Metabolic Panel:  Recent Labs Lab 08/02/15 1017 08/06/15 2121 08/07/15 0924  NA 139 136 137  K 4.2 4.3 4.2  CL  --  101 103  CO2 27 26 29   GLUCOSE 97 179* 100*  BUN 25.4 34* 32*  CREATININE 0.9 0.83 0.75  CALCIUM 8.9 8.9 8.7*   GFR: Estimated Creatinine Clearance: 56.4 mL/min (by C-G formula based on Cr of 0.75). Liver Function Tests:  Recent Labs Lab 08/02/15 1017 08/07/15 0924  AST 56* 54*  ALT 16 21  ALKPHOS 78 68  BILITOT 0.37 0.4  PROT 6.0* 6.0*  ALBUMIN 2.8* 3.0*   No results for input(s): LIPASE, AMYLASE in the last 168 hours. No results for input(s): AMMONIA in the last 168 hours. Coagulation Profile:  Recent Labs Lab 08/06/15 2121  INR 0.99   Cardiac  Enzymes:  Recent Labs Lab 08/03/15 1205  CKTOTAL 459*   BNP (last 3 results) No results for input(s): PROBNP in the last 8760 hours. HbA1C: No results for input(s): HGBA1C in the last 72 hours. CBG: No results for input(s): GLUCAP in the last 168 hours. Lipid Profile: No results for input(s): CHOL, HDL, LDLCALC, TRIG, CHOLHDL, LDLDIRECT in the last 72 hours. Thyroid Function Tests: No results for input(s): TSH, T4TOTAL, FREET4, T3FREE, THYROIDAB in the last 72 hours. Anemia Panel: No results for input(s): VITAMINB12, FOLATE, FERRITIN, TIBC, IRON, RETICCTPCT in the last 72 hours. Urine analysis:    Component Value Date/Time   COLORURINE YELLOW 05/24/2015 1930   APPEARANCEUR CLEAR 05/24/2015 1930   LABSPEC >1.030* 05/24/2015 1930   PHURINE 5.0 05/24/2015 1930   GLUCOSEU NEGATIVE 05/24/2015 1930   HGBUR NEGATIVE 05/24/2015 1930   BILIRUBINUR NEGATIVE 05/24/2015 1930   KETONESUR NEGATIVE 05/24/2015 1930   PROTEINUR TRACE* 05/24/2015 1930   UROBILINOGEN 0.2 10/25/2014 0021   NITRITE NEGATIVE 05/24/2015 1930   LEUKOCYTESUR TRACE* 05/24/2015 1930   Sepsis Labs: @LABRCNTIP (procalcitonin:4,lacticidven:4) ) Recent Results (from the past 240 hour(s))  Body fluid culture     Status: None   Collection Time: 07/30/15  5:00 PM  Result Value Ref Range Status   Specimen Description SYNOVIAL LEFT KNEE  Final   Special Requests NONE  Final   Gram Stain   Final    FEW WBC PRESENT,BOTH PMN AND MONONUCLEAR NO ORGANISMS SEEN    Culture   Final    NO GROWTH 3 DAYS Performed at [facility]    Report Status 08/03/2015 FINAL  Final  TECHNOLOGIST REVIEW     Status: None   Collection Time: 08/02/15 10:17 AM  Result Value Ref Range Status   Technologist Review few targets  Final  Surgical pcr screen     Status: None   Collection Time: 08/07/15  7:46 AM  Result Value Ref Range Status   MRSA, PCR NEGATIVE NEGATIVE Final   Staphylococcus aureus NEGATIVE NEGATIVE Final    Comment:         The Xpert SA Assay (FDA approved for NASAL specimens in patients over 33 years of age), is one component of a comprehensive surveillance program.  Test performance has been validated by [facility] for patients greater than or equal to 60 year old. It is not intended to diagnose infection nor to guide or monitor treatment.          Radiology Studies: Dg Pelvis 1-2 Views  08/06/2015  CLINICAL DATA:  Left leg pain after a fall today. EXAM: PELVIS - 1-2 VIEW COMPARISON:  None. FINDINGS: There is a transverse minimally comminuted fracture of  the proximal left femoral shaft below the level of lesser trochanter. There is impaction and medial angulation of the distal fracture fragment. Diffuse coarsening of trabecular pattern in the pelvis bilaterally consistent with Paget's disease. Patches changes also probably involving the sacrum although limited visualization due to overlying bowel. Calcifications in the pelvis consistent with phleboliths. IMPRESSION: Transverse mildly comminuted fracture of the proximal left femoral shaft with medial angulation and impaction of the fracture fragments. Changes of Paget's disease in the pelvis and sacrum. Electronically Signed   By: Lucienne Capers M.D.   On: 08/06/2015 22:36   Dg Tibia/fibula Left  08/06/2015  CLINICAL DATA:  60 year old female with fall and left leg pain. EXAM: LEFT TIBIA AND FIBULA - 2 VIEW; LEFT FOOT - COMPLETE 3+ VIEW; LEFT FEMUR 2 VIEWS COMPARISON:  Pelvic radiograph dated 08/06/2015 FINDINGS: There is a transverse fracture of the left femoral proximal diaphysis with mild medial angulation and impaction. No other acute fracture identified. There is no dislocation. There is bony expansion of the pelvis with trabecular coarsening compatible with Paget's disease. There is also pagetoid changes of the tibia with cortical thickening and bowing of the bone. The soft tissues appear unremarkable IMPRESSION: Mildly angulated and impacted  fracture of the proximal left femoral diaphysis. Pagetoid changes of the pelvic bone and tibia. Electronically Signed   By: Anner Crete M.D.   On: 08/06/2015 22:40   Dg Chest Port 1 View  08/07/2015  CLINICAL DATA:  Preoperative. History of lung abnormality, hypertension, emphysema, former smoker. EXAM: PORTABLE CHEST 1 VIEW COMPARISON:  CT chest 08/02/2015.  Chest 06/14/2015. FINDINGS: 11 mm diameter spiculated nodule in the right mid lung. This is smaller than the opacity seen on prior chest radiograph. Decreasing size may indicate that this is resolving inflammatory process. However, based on morphology the lesion is indeterminate and malignancy should be excluded. Left lung is clear. Power port type central venous catheter with tip over the mid SVC region. Normal heart size and pulmonary vascularity. Calcified and tortuous aorta. IMPRESSION: 11 mm spiculated nodule in the right mid lung. The opacity is smaller than on prior study, possibly indicating resolving inflammatory process. Malignancy should still be excluded however. Follow-up until resolution is recommended. Electronically Signed   By: Lucienne Capers M.D.   On: 08/07/2015 00:21   Dg Foot Complete Left  08/06/2015  CLINICAL DATA:  60 year old female with fall and left leg pain. EXAM: LEFT TIBIA AND FIBULA - 2 VIEW; LEFT FOOT - COMPLETE 3+ VIEW; LEFT FEMUR 2 VIEWS COMPARISON:  Pelvic radiograph dated 08/06/2015 FINDINGS: There is a transverse fracture of the left femoral proximal diaphysis with mild medial angulation and impaction. No other acute fracture identified. There is no dislocation. There is bony expansion of the pelvis with trabecular coarsening compatible with Paget's disease. There is also pagetoid changes of the tibia with cortical thickening and bowing of the bone. The soft tissues appear unremarkable IMPRESSION: Mildly angulated and impacted fracture of the proximal left femoral diaphysis. Pagetoid changes of the pelvic bone  and tibia. Electronically Signed   By: Anner Crete M.D.   On: 08/06/2015 22:40   Dg C-arm 1-60 Min-no Report  08/07/2015  CLINICAL DATA: left hip fracture C-ARM 1-60 MINUTES Fluoroscopy was utilized by the requesting physician.  No radiographic interpretation.   Dg Hip Operative Unilat With Pelvis Left  08/07/2015  CLINICAL DATA:  IM nail of left leg. EXAM: DG C-ARM 1-60 MIN-NO REPORT; OPERATIVE LEFT HIP WITH PELVIS COMPARISON:  07/27/2015. FINDINGS: Four  intraoperative spot fluoro film show placement of a dynamic hip screw with a long femoral intra medullary nail. Single distal interlocking screw evident. Nail crosses a proximal femur fracture with marked improvement in alignment compared to pre reduction films. IMPRESSION: Intraoperative assessment during ORIF for proximal femur fracture. No evidence for immediate hardware complications. Electronically Signed   By: Misty Stanley M.D.   On: 08/07/2015 15:35   Dg Femur Min 2 Views Left  08/06/2015  CLINICAL DATA:  60 year old female with fall and left leg pain. EXAM: LEFT TIBIA AND FIBULA - 2 VIEW; LEFT FOOT - COMPLETE 3+ VIEW; LEFT FEMUR 2 VIEWS COMPARISON:  Pelvic radiograph dated 08/06/2015 FINDINGS: There is a transverse fracture of the left femoral proximal diaphysis with mild medial angulation and impaction. No other acute fracture identified. There is no dislocation. There is bony expansion of the pelvis with trabecular coarsening compatible with Paget's disease. There is also pagetoid changes of the tibia with cortical thickening and bowing of the bone. The soft tissues appear unremarkable IMPRESSION: Mildly angulated and impacted fracture of the proximal left femoral diaphysis. Pagetoid changes of the pelvic bone and tibia. Electronically Signed   By: Anner Crete M.D.   On: 08/06/2015 22:40      Scheduled Meds: . [facility] Hold] antiseptic oral rinse  7 mL Mouth Rinse q12n4p  . [MAR Hold] chlorhexidine  15 mL Mouth Rinse BID  . [MAR  Hold] feeding supplement (JEVITY 1.2 CAL)  355 mL Per Tube TID WC & HS  . [MAR Hold] heparin  5,000 Units Subcutaneous Q8H  . [MAR Hold] hydrocortisone sod succinate (SOLU-CORTEF) inj  100 mg Intravenous Q8H  . [MAR Hold] pantoprazole sodium  40 mg Per Tube BID  . [MAR Hold] sodium chloride flush  10-40 mL Intracatheter Q12H  . [MAR Hold] sodium chloride flush  3 mL Intravenous Q12H  . [MAR Hold] sucralfate  1 g Oral Q6H   Continuous Infusions: . sodium chloride 125 mL/hr at 08/07/15 1038  . lactated ringers       LOS: 0 days    Time spent in minutes: 72    [place], MD [facility] Pager: [email or web address] Password [facility] 08/07/2015, 3:46 PM

## 2015-08-07 NOTE — Anesthesia Postprocedure Evaluation (Signed)
Anesthesia Post Note  Patient: Megan Sims  Procedure(s) Performed: Procedure(s) (LRB): INTRAMEDULLARY (IM) NAIL INTERTROCHANTRIC (Left)  Patient location during evaluation: PACU Anesthesia Type: General Level of consciousness: sedated Pain management: pain level controlled Vital Signs Assessment: post-procedure vital signs reviewed and stable Respiratory status: spontaneous breathing and respiratory function stable Cardiovascular status: stable Anesthetic complications: no    Last Vitals:  Filed Vitals:   08/07/15 1545 08/07/15 1600  BP: 155/84 166/90  Pulse: 73 83  Temp: 36.4 C 36.4 C  Resp: 12 12    Last Pain:  Filed Vitals:   08/07/15 1607  PainSc: 3                  Shellie Rogoff DANIEL

## 2015-08-07 NOTE — Anesthesia Preprocedure Evaluation (Addendum)
Anesthesia Evaluation  Patient identified by MRN, date of birth, ID band Patient awake    Reviewed: Allergy & Precautions, NPO status , Patient's Chart, lab work & pertinent test results  Airway Mallampati: II  TM Distance: >3 FB     Dental no notable dental hx. (+) Dental Advisory Given   Pulmonary COPD, former smoker,    + rhonchi        Cardiovascular hypertension, + Peripheral Vascular Disease  Normal cardiovascular exam  Study Conclusions  - Left ventricle: The cavity size was normal. Wall thickness was increased in a pattern of mild LVH. Systolic function was normal. The estimated ejection fraction was in the range of 55% to 60%. Wall motion was normal; there were no regional wall motion abnormalities. Doppler parameters are consistent with abnormal left ventricular relaxation (grade 1 diastolic    Neuro/Psych negative neurological ROS  negative psych ROS   GI/Hepatic negative GI ROS, Neg liver ROS,   Endo/Other    Renal/GU negative Renal ROS     Musculoskeletal   Abdominal   Peds  Hematology   Anesthesia Other Findings   Reproductive/Obstetrics                            Anesthesia Physical Anesthesia Plan  ASA: III  Anesthesia Plan: MAC and Spinal   Post-op Pain Management:    Induction:   Airway Management Planned: Simple Face Mask and Natural Airway  Additional Equipment:   Intra-op Plan:   Post-operative Plan:   Informed Consent: I have reviewed the patients History and Physical, chart, labs and discussed the procedure including the risks, benefits and alternatives for the proposed anesthesia with the patient or authorized representative who has indicated his/her understanding and acceptance.   Dental advisory given  Plan Discussed with: CRNA, Anesthesiologist and Surgeon  Anesthesia Plan Comments:        Anesthesia Quick Evaluation

## 2015-08-07 NOTE — Consult Note (Signed)
Melrose Nakayama, MD  Chauncey Cruel, PA-C  Loni Dolly, PA-C                                  [facility]                [address] [place], [place]  [address]   [place]            MRN:  [id number] DOB/SEX:  07-15-1955/female     CHIEF COMPLAINT:  Painful left leg  HISTORY: Megan Sims a 60 y.o. female with lung cancer and Paget's among other issues.  Fell yesterday at home and now has extreme pain and a broken femur.  Has been wheelchair dependent for a couple of weeks   PAST MEDICAL HISTORY: Patient Active Problem List   Diagnosis Date Noted  . Fall from slip, trip, or stumble 08/07/2015  . Left femoral shaft fracture ([address]) 08/07/2015  . 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 S/P gastrostomy ([place]) 08/04/2015  . Deficiency anemia 08/03/2015  . Dehydration 07/02/2015  . Candida esophagitis ([place])   . Anemia due to other cause   . Mucosal abnormality of stomach   . Pill esophagitis due to tetracycline 05/24/2015  . UTI (lower urinary tract infection) 05/24/2015  . Emphysema of lung ([place]) 05/05/2015  . Rash, skin 05/05/2015  . Severe sepsis ([place]) 04/10/2015  . AKI (acute kidney injury) ([place]) 04/10/2015  . Hyponatremia 04/10/2015  . Sepsis due to pneumonia ([place])   . CAP (community acquired pneumonia) 04/09/2015  . Skin ulcer ([place]) 11/18/2014  . Musculoskeletal chest pain 10/25/2014  . Pain in the chest   . Tachycardia   . Anemia due to antineoplastic chemotherapy 10/21/2014  . Cancer of upper lobe of right lung ([place]) 10/07/2014  . Oral thrush 09/24/2014  . Nicotine dependence 09/24/2014  . Chronic neck pain 09/24/2014  . Excessive weight loss 09/24/2014  . 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 Bilateral lung cancer ([place])   . Paget's disease of bone   . Dysphagia 09/05/2014  . Essential hypertension 09/05/2014   Past Medical History  Diagnosis Date  . Migraine   . Lung  abnormality   . Hypertension   . Pleurisy   . Cerebral aneurysm 2 brain surgeries 96 or 97  . Paget disease of bone   . Diarrhea   . Renal insufficiency     Patient states " no kidney problems  . S/P biopsy     of throat per patient.  . Oral thrush 09/24/2014  . Nicotine dependence 09/24/2014  . Chronic neck pain 09/24/2014  . Skin ulcer ([place]) 11/18/2014  . Emphysema of lung ([place]) 05/05/2015  . Malignant neoplasm of unknown origin ([place])   . Cancer [facility])     malignant neoplasm of unknown origin  . Left knee pain   . Left leg weakness 06/2015   Past Surgical History  Procedure Laterality Date  . Chest tube insertion    . Tubal ligation    . Cerebral aneurysm repair  96 or 97  . Colonoscopy      10 years ago in [place]   . Esophagogastroduodenoscopy  06/16/2015    Dr. Gala Romney: edentulous cricopharyngeus, esophageal plaques, biopsy consistent with candida  . Laryngoscopy  Jun 22, 2015    [facility], Dr. Erik Obey: normal oropharynx, no lesions, mobile vocal cords with good airway.  . Esophagogastroduodenoscopy  N/A 06/16/2015    Procedure: ESOPHAGOGASTRODUODENOSCOPY (EGD);  Surgeon: Daneil Dolin, MD;  [facility];  Service: Endoscopy;  Laterality: N/A;  . Peg placement N/A 07/06/2015    Procedure: PERCUTANEOUS ENDOSCOPIC GASTROSTOMY (PEG) PLACEMENT;  Surgeon: Danie Binder, MD;  Location: [facility];  Service: Endoscopy;  Laterality: N/A;  . Esophagogastroduodenoscopy N/A 07/06/2015    Procedure: ESOPHAGOGASTRODUODENOSCOPY (EGD);  Surgeon: Danie Binder, MD;  Location: [facility];  Service: Endoscopy;  Laterality: N/A;     MEDICATIONS:   Current facility-administered medications:  .  0.9 %  sodium chloride infusion, , Intravenous, Continuous, Reubin Milan, MD, Last Rate: 50 mL/hr at 08/07/15 0301 .  antiseptic oral rinse (CPC / CETYLPYRIDINIUM CHLORIDE 0.05%) solution 7 mL, 7 mL, Mouth Rinse, q12n4p, Reubin Milan, MD .  chlorhexidine (PERIDEX) 0.12 % solution 15  mL, 15 mL, Mouth Rinse, BID, Reubin Milan, MD .  heparin injection 5,000 Units, 5,000 Units, Subcutaneous, Q8H, Reubin Milan, MD, 5,000 Units at 08/07/15 [id number] .  HYDROmorphone (DILAUDID) injection 1 mg, 1 mg, Intravenous, Q3H PRN, Reubin Milan, MD, 1 mg at 08/07/15 0315 .  ondansetron (ZOFRAN) injection 4 mg, 4 mg, Intravenous, Q8H PRN, Hannah Muthersbaugh, PA-C .  sodium chloride flush (NS) 0.9 % injection 3 mL, 3 mL, Intravenous, Q12H, Reubin Milan, MD, 0 mL at 08/07/15 3149  ALLERGIES:  No Known Allergies  REVIEW OF SYSTEMS: REVIEWED IN DETAIL IN CHART  FAMILY HISTORY:   Family History  Problem Relation Age of Onset  . Hypertension    . Diabetes    . Kidney disease    . Cancer Mother     throat ca  . Cancer Maternal Grandmother     thyroid ca  . Colon cancer Neg Hx     SOCIAL HISTORY:   Social History  Substance Use Topics  . Smoking status: Former Smoker -- 0.50 packs/day for 34 years    Types: Cigarettes  . Smokeless tobacco: Never Used     Comment: HOWEVER, will smoke 1 cigarette if "tragic" event (death), or if she needs it for her nerves   . Alcohol Use: No     EXAMINATION: Vital signs in last 24 hours: Temp:  [97.6 F (36.4 C)-98.3 F (36.8 C)] 98.1 F (36.7 C) (07/15 0625) Pulse Rate:  [90-103] 95 (07/15 0625) Resp:  [14-18] 18 (07/15 0243) BP: (132-182)/(69-100) 169/95 mmHg (07/15 0625) SpO2:  [87 %-100 %] 100 % (07/15 0625) Weight:  [53.978 kg (119 lb)-54.522 kg (120 lb 3.2 oz)] 53.978 kg (119 lb) (07/15 0243)  BP 169/95 mmHg  Pulse 95  Temp(Src) 98.1 F (36.7 C) (Oral)  Resp 18  Ht '5\' 1"'$  (1.549 m)  Wt 53.978 kg (119 lb)  BMI 22.50 kg/m2  SpO2 100%  General Appearance:    Alert, cooperative, no distress, appears stated age  Head:    Normocephalic, without obvious abnormality, atraumatic  Eyes:    PERRL, conjunctiva/corneas clear, EOM's intact, fundi    benign, both eyes  Ears:    Normal TM's and external ear canals, both  ears  Nose:   Nares normal, septum midline, mucosa normal, no drainage    or sinus tenderness  Throat:   Lips, mucosa, and tongue normal; teeth and gums normal  Neck:   Supple, symmetrical, trachea midline, no adenopathy;    thyroid:  no enlargement/tenderness/nodules; no carotid   bruit or JVD  Back:     Symmetric, no curvature, ROM  normal, no CVA tenderness  Lungs:     Clear to auscultation bilaterally, respirations unlabored  Chest Wall:    No tenderness or deformity   Heart:    Regular rate and rhythm, S1 and S2 normal, no murmur, rub   or gallop  Breast Exam:    No tenderness, masses, or nipple abnormality  Abdomen:     Soft, non-tender, bowel sounds active all four quadrants,    no masses, no organomegaly  Genitalia:    Rectal:    Extremities:   Extremities normal, atraumatic, no cyanosis or edema  Pulses:   2+ and symmetric all extremities  Skin:   Skin color, texture, turgor normal, no rashes or lesions  Lymph nodes:   Cervical, supraclavicular, and axillary nodes normal  Neurologic:   CNII-XII intact, normal strength, sensation and reflexes    throughout    Musculoskeletal Exam:   Extreme pain on left leg motion, minimal swelling   DIAGNOSTIC STUDIES: Recent laboratory studies:  Recent Labs  08/02/15 1017 08/03/15 1205 08/06/15 2121  WBC 8.9 9.5 8.6  HGB 9.5 Repeated and Verified* 9.9* 9.5*  HCT 29.6* 30.8* 29.2*  PLT 287 305 321    Recent Labs  08/02/15 1017 08/06/15 2121  NA 139 136  K 4.2 4.3  CL  --  101  CO2 27 26  BUN 25.4 34*  CREATININE 0.9 0.83  GLUCOSE 97 179*  CALCIUM 8.9 8.9   Lab Results  Component Value Date   INR 0.99 08/06/2015   INR 1.04 04/10/2015   INR 0.97 10/08/2014     Recent Radiographic Studies :  Dg Lumbar Spine Complete  07/27/2015  CLINICAL DATA:  Paget's disease. History of lung cancer. Lumbar pain extending to the left pelvis and leg. EXAM: LUMBAR SPINE - COMPLETE 4+ VIEW COMPARISON:  CT 01/28/2015 FINDINGS: No evidence  of regional fracture. Chronic changes of Paget's disease affecting T11, L1, L4, L5 and the sacrum. Lower lumbar facet arthropathy could also contribute to pain. Also noted is aortic atherosclerosis. IMPRESSION: No acute finding. Chronic Paget's disease affecting T11, L1, L4, L5 and the sacrum. Lower lumbar facet arthropathy could also contribute to pain. Aortic atherosclerosis. Electronically Signed   By: Nelson Chimes M.D.   On: 07/27/2015 20:55   Dg Pelvis 1-2 Views  08/06/2015  CLINICAL DATA:  Left leg pain after a fall today. EXAM: PELVIS - 1-2 VIEW COMPARISON:  None. FINDINGS: There is a transverse minimally comminuted fracture of the proximal left femoral shaft below the level of lesser trochanter. There is impaction and medial angulation of the distal fracture fragment. Diffuse coarsening of trabecular pattern in the pelvis bilaterally consistent with Paget's disease. Patches changes also probably involving the sacrum although limited visualization due to overlying bowel. Calcifications in the pelvis consistent with phleboliths. IMPRESSION: Transverse mildly comminuted fracture of the proximal left femoral shaft with medial angulation and impaction of the fracture fragments. Changes of Paget's disease in the pelvis and sacrum. Electronically Signed   By: Lucienne Capers M.D.   On: 08/06/2015 22:36   Dg Tibia/fibula Left  08/06/2015  CLINICAL DATA:  60 year old female with fall and left leg pain. EXAM: LEFT TIBIA AND FIBULA - 2 VIEW; LEFT FOOT - COMPLETE 3+ VIEW; LEFT FEMUR 2 VIEWS COMPARISON:  Pelvic radiograph dated 08/06/2015 FINDINGS: There is a transverse fracture of the left femoral proximal diaphysis with mild medial angulation and impaction. No other acute fracture identified. There is no dislocation. There is bony expansion of the pelvis  with trabecular coarsening compatible with Paget's disease. There is also pagetoid changes of the tibia with cortical thickening and bowing of the bone. The  soft tissues appear unremarkable IMPRESSION: Mildly angulated and impacted fracture of the proximal left femoral diaphysis. Pagetoid changes of the pelvic bone and tibia. Electronically Signed   By: Anner Crete M.D.   On: 08/06/2015 22:40   Dg Ankle Complete Left  07/27/2015  CLINICAL DATA:  Lower extremity pain without injury. History of Paget's disease and lung cancer. EXAM: LEFT ANKLE COMPLETE - 3+ VIEW COMPARISON:  08/29/2009 FINDINGS: no abnormality at the ankle joint itself. Paget's disease of the tibia again seen with thickened well of an appearance of the cortex. No evidence of fracture or focal lesion. IMPRESSION: No acute finding.  Chronic changes of Paget's disease of the tibia. Electronically Signed   By: Nelson Chimes M.D.   On: 07/27/2015 20:51   Ct Chest W Contrast  08/02/2015  CLINICAL DATA:  History of moderately to poorly differentiated carcinoma of likely lung primary. Status post radiation to right chest supraclavicular area. EXAM: CT CHEST WITH CONTRAST TECHNIQUE: Multidetector CT imaging of the chest was performed during intravenous contrast administration. CONTRAST:  8m ISOVUE-300 IOPAMIDOL (ISOVUE-300) INJECTION 61% COMPARISON:  06/14/2015 FINDINGS: Mediastinum / Lymph Nodes: Prominent axillary lymph nodes bilaterally demonstrate central fatty hila but have progressed slightly in the interval. Right axillary lymph node measures up to 10 mm short axis. No mediastinal or hilar lymphadenopathy. Heart size is normal. Coronary artery calcification is noted. The esophagus has normal imaging features. There is some edema in the soft tissues of the lower neck and upper chest anteriorly. No definite supraclavicular lesion is identified. Lungs / Pleura: Lung windows again demonstrate emphysema. Interstitial and irregular confluent airspace opacity in the posterior right upper lobe is stable in the interval. The patient has developed progressive right middle lobe collapse since prior CT  without a discrete centrally obstructing lesion evident. 4 mm left lower lobe pulmonary nodule is stable. No pulmonary edema or pleural effusion. Upper Abdomen: Gastrostomy tube again noted. Atherosclerotic calcification noted in the wall of the incompletely visualized abdominal aorta. 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 IMPRESSION: 1. Interval development of complete right middle lobe collapse without a discrete central obstructing lesion evident. 2. The irregular ill-defined central airspace opacity identified previously in the posterior right upper lobe is not substantially changed in the interval. 3. Coronary artery and abdominal aortic atherosclerosis. 4. 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 Electronically Signed   By: EMisty StanleyM.D.   On: 08/02/2015 15:28   [place] Img Lower Unilateral Left  07/28/2015  CLINICAL DATA:  Left lower extremity pain for 1 week. Evaluate for DVT. EXAM: Left LOWER EXTREMITY VENOUS DOPPLER ULTRASOUND TECHNIQUE: Gray-scale sonography with graded compression, as well as color Doppler and duplex ultrasound were performed to evaluate the lower extremity deep venous systems from the level of the common femoral vein and including the common femoral, femoral, profunda femoral, popliteal and calf veins including the posterior tibial, peroneal and gastrocnemius veins when visible. The superficial great saphenous vein was also interrogated. Spectral Doppler was utilized to evaluate flow at rest and with distal augmentation maneuvers in the common femoral, femoral and popliteal veins. COMPARISON:  08/29/2009 FINDINGS: Contralateral Common Femoral Vein: Respiratory phasicity is normal and symmetric with the symptomatic side. No evidence of thrombus. Normal compressibility. Common Femoral Vein: No  evidence of thrombus. Saphenofemoral Junction: No evidence of thrombus. Profunda Femoral  Vein: No evidence of thrombus. Femoral Vein: No evidence of thrombus. Popliteal Vein: No evidence of thrombus. Calf Veins: No evidence of thrombus. Superficial Great Saphenous Vein: No evidence of thrombus. IMPRESSION: No evidence of left lower extremity deep venous thrombosis. Electronically Signed   By: Monte Fantasia M.D.   On: 07/28/2015 10:49   Dg Chest Port 1 View  08/07/2015  CLINICAL DATA:  Preoperative. History of lung abnormality, hypertension, emphysema, former smoker. EXAM: PORTABLE CHEST 1 VIEW COMPARISON:  CT chest 08/02/2015.  Chest 06/14/2015. FINDINGS: 11 mm diameter spiculated nodule in the right mid lung. This is smaller than the opacity seen on prior chest radiograph. Decreasing size may indicate that this is resolving inflammatory process. However, based on morphology the lesion is indeterminate and malignancy should be excluded. Left lung is clear. Power port type central venous catheter with tip over the mid SVC region. Normal heart size and pulmonary vascularity. Calcified and tortuous aorta. IMPRESSION: 11 mm spiculated nodule in the right mid lung. The opacity is smaller than on prior study, possibly indicating resolving inflammatory process. Malignancy should still be excluded however. Follow-up until resolution is recommended. Electronically Signed   By: Lucienne Capers M.D.   On: 08/07/2015 00:21   Dg Knee Complete 4 Views Left  07/27/2015  CLINICAL DATA:  History of Paget's disease. Pain at the left lower extremity. No known injury. EXAM: LEFT KNEE - COMPLETE 4+ VIEW COMPARISON:  08/29/2009 FINDINGS: No joint effusion. No fracture, dislocation or degenerative change. No focal bone lesion in this region. IMPRESSION: Negative. Electronically Signed   By: Nelson Chimes M.D.   On: 07/27/2015 20:53   Dg Foot Complete Left  08/06/2015  CLINICAL DATA:  60 year old female with fall and left leg pain. EXAM: LEFT TIBIA AND FIBULA - 2 VIEW; LEFT FOOT - COMPLETE 3+ VIEW; LEFT FEMUR 2 VIEWS  COMPARISON:  Pelvic radiograph dated 08/06/2015 FINDINGS: There is a transverse fracture of the left femoral proximal diaphysis with mild medial angulation and impaction. No other acute fracture identified. There is no dislocation. There is bony expansion of the pelvis with trabecular coarsening compatible with Paget's disease. There is also pagetoid changes of the tibia with cortical thickening and bowing of the bone. The soft tissues appear unremarkable IMPRESSION: Mildly angulated and impacted fracture of the proximal left femoral diaphysis. Pagetoid changes of the pelvic bone and tibia. Electronically Signed   By: Anner Crete M.D.   On: 08/06/2015 22:40   Dg Hip Unilat With Pelvis 2-3 Views Left  07/27/2015  CLINICAL DATA:  Paget's disease. Pain of the left lower extremity. History of lung cancer. EXAM: DG HIP (WITH OR WITHOUT PELVIS) 2-3V LEFT COMPARISON:  None. FINDINGS: Paget's disease diffusely involving the pelvis an the sacrum. No involvement of the proximal femurs. No evidence of fracture or focal destructive lesion. IMPRESSION: Paget's disease diffusely and chronically affecting the bones of the pelvis and the sacrum. No acute finding. Hip joint appears unremarkable. Electronically Signed   By: Nelson Chimes M.D.   On: 07/27/2015 20:52   Dg Femur Min 2 Views Left  08/06/2015  CLINICAL DATA:  60 year old female with fall and left leg pain. EXAM: LEFT TIBIA AND FIBULA - 2 VIEW; LEFT FOOT - COMPLETE 3+ VIEW; LEFT FEMUR 2 VIEWS COMPARISON:  Pelvic radiograph dated 08/06/2015 FINDINGS: There is a transverse fracture of the left femoral proximal diaphysis with mild medial angulation and impaction. No other acute  fracture identified. There is no dislocation. There is bony expansion of the pelvis with trabecular coarsening compatible with Paget's disease. There is also pagetoid changes of the tibia with cortical thickening and bowing of the bone. The soft tissues appear unremarkable IMPRESSION: Mildly  angulated and impacted fracture of the proximal left femoral diaphysis. Pagetoid changes of the pelvic bone and tibia. Electronically Signed   By: Anner Crete M.D.   On: 08/06/2015 22:40    ASSESSMENT:  Left femur fracture   PLAN:  Despite medical issues this requires surgical stabilization.  Hope we can do this later today to enable her to sit and hopefully get OOB.  Not quite sure why she has been in wheelchair but she may have been developing stress fracture which completed itself.  Will need stress steroids around surgery. Reviewed risks of surgery which are elevated but really no good alternative.  Lacorey Brusca G 08/07/2015, 7:59 AM

## 2015-08-07 NOTE — Interval H&P Note (Signed)
History and Physical Interval Note:  08/07/2015 2:38 PM  Megan Sims  has presented today for surgery, with the diagnosis of left hip fracture  The various methods of treatment have been discussed with the patient and family. After consideration of risks, benefits and other options for treatment, the patient has consented to  Procedure(s): INTRAMEDULLARY (IM) NAIL INTERTROCHANTRIC (Left) as a surgical intervention .  The patient's history has been reviewed, patient examined, no change in status, stable for surgery.  I have reviewed the patient's chart and labs.  Questions were answered to the patient's satisfaction.     [name] G

## 2015-08-07 NOTE — Op Note (Signed)
Megan Sims [id number] 08/07/2015   PRE-OP DIAGNOSIS: left subtroch femur fracture  POST-OP DIAGNOSIS: same  PROCEDURE: left hip troch nail  ANESTHESIA: spinal and MAC  [name]   Dictation #:  [id number]  PWB with PT ASA for DVT prophylaxis

## 2015-08-07 NOTE — Clinical Social Work Note (Signed)
Clinical Social Work Assessment  Patient Details  Name: Megan Sims MRN: [id number] Date of Birth: 07-07-1955  Date of referral:  08/07/15               Reason for consult:  Discharge Planning, Facility Placement                Permission sought to share information with:  Facility Sport and exercise psychologist, Family Supports Permission granted to share information::  Yes, Verbal Permission Granted  Name::     Megan Sims and Musician::  SNFs  Relationship::  Spouse and son  Contact Information:     Housing/Transportation Living arrangements for the past 2 months:  Single Family Home Source of Information:  Patient Patient Interpreter Needed:  None Criminal Activity/Legal Involvement Pertinent to Current Situation/Hospitalization:  No - Comment as needed Significant Relationships:  Adult Children, Spouse Lives with:  Spouse Do you feel safe going back to the place where you live?  No Need for family participation in patient care:  Yes (Comment)  Care giving concerns:  The patient states that she does not believe she will be capable of returning home at time of discharge. She already had difficulty mobilizing around her home prior to this fracture.    Social Worker assessment / plan:  CSW met with patient at bedside to complete assessment. The patient was resting comfortably in bed. The patient shares that she experienced a fall at home which resulted in her fracture. She complains of pain associated with fracture. CSW explained reason for visit. The patient does feel that she would benefit from SNF placement at time of discharge given her issues with mobility. CSW explained SNF search/placement process and answered the patient's questions. CSW explained that the the patient's options will likely be limited given her Medicaid insurance. The patient verbalizes understanding. CSW will follow up with bed offers once available.   Employment status:  Disabled (Comment on whether or not currently  receiving Disability) Insurance information:  Medicaid In [place] PT Recommendations:  Not assessed at this time (Patient says she isn't going to be able to manage at home. ) Information / Referral to community resources:  [place]  Patient/Family's Response to care:  The patient appears to be in some pain but she express appreciation for the care she has received and the assistance of CSW with DC planning.   Patient/Family's Understanding of and Emotional Response to Diagnosis, Current Treatment, and Prognosis:  The patient appears to have good insight into the reason for her admission. She understands that her mobility will be greatly impacted by this fracture and sees the benefits of being placed at least temporarily post discharge. Despite the pain, the patient appears to be coping well with hospitalization.   Emotional Assessment Appearance:  Appears stated age Attitude/Demeanor/Rapport:  Other (Patient is appropriate and welcoming of CSW.) Affect (typically observed):  Accepting, Appropriate, Calm, Pleasant Orientation:  Oriented to Self, Oriented to Place, Oriented to  Time, Oriented to Situation Alcohol / Substance use:  Not Applicable Psych involvement (Current and /or in the community):  No (Comment)  Discharge Needs  Concerns to be addressed:  Discharge Planning Concerns, Care Coordination Readmission within the last 30 days:  No Current discharge risk:  Physical Impairment Barriers to Discharge:  Continued Medical Work up   [place], LCSW 08/07/2015, 10:59 AM

## 2015-08-07 NOTE — [place] FL2 (Signed)
MEDICAID FL2 LEVEL OF CARE SCREENING TOOL     IDENTIFICATION  Patient Name: Megan Sims Birthdate: September 09, 1955 Sex: female Admission Date (Current Location): 08/06/2015  [facility] and [place] Number:  Engineer, manufacturing systems and Address:  [facility],  [place] [address], [place]      Provider Number: [id number]  Attending Physician Name and Address:  Debbe Odea, MD  Relative Name and Phone Number:       Current Level of Care: Hospital Recommended Level of Care: [place] Prior Approval Number:    Date Approved/Denied:   PASRR Number: [id number]  Discharge Plan: SNF    Current Diagnoses: Patient Active Problem List   Diagnosis Date Noted  . Fall from slip, trip, or stumble 08/07/2015  . Left femoral shaft fracture ([place]) 08/07/2015  . 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 S/P gastrostomy ([place]) 08/04/2015  . Deficiency anemia 08/03/2015  . Dehydration 07/02/2015  . Candida esophagitis ([place])   . Anemia due to other cause   . Mucosal abnormality of stomach   . Pill esophagitis due to tetracycline 05/24/2015  . UTI (lower urinary tract infection) 05/24/2015  . Emphysema of lung ([place]) 05/05/2015  . Rash, skin 05/05/2015  . Severe sepsis ([place]) 04/10/2015  . AKI (acute kidney injury) ([place]) 04/10/2015  . Hyponatremia 04/10/2015  . Sepsis due to pneumonia ([place])   . CAP (community acquired pneumonia) 04/09/2015  . Skin ulcer ([place]) 11/18/2014  . Musculoskeletal chest pain 10/25/2014  . Pain in the chest   . Tachycardia   . Anemia due to antineoplastic chemotherapy 10/21/2014  . Cancer of upper lobe of right lung ([place]) 10/07/2014  . Oral thrush 09/24/2014  . Nicotine dependence 09/24/2014  . Chronic neck pain 09/24/2014  . Excessive weight loss 09/24/2014  . 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 Bilateral lung cancer ([place])   . Paget's disease of bone   . Dysphagia 09/05/2014  .  Essential hypertension 09/05/2014    Orientation RESPIRATION BLADDER Height & Weight     Self, Time, Situation, Place  Normal Continent Weight: 53.978 kg (119 lb) Height:  '5\' 1"'$  (154.9 cm)  BEHAVIORAL SYMPTOMS/MOOD NEUROLOGICAL BOWEL NUTRITION STATUS   (NONE)  (NONE) Continent Diet, Feeding tube ([place])  AMBULATORY STATUS COMMUNICATION OF NEEDS Skin   Extensive Assist Verbally Surgical wounds                       Personal Care Assistance Level of Assistance  Bathing, Feeding, Dressing Bathing Assistance: Limited assistance Feeding assistance: Independent Dressing Assistance: Limited assistance     Functional Limitations Info  Sight, Hearing, Speech Sight Info: Adequate Hearing Info: Adequate Speech Info: Adequate    SPECIAL CARE FACTORS FREQUENCY                       Contractures Contractures Info: Not present    Additional Factors Info  Allergies, Code Status Code Status Info: Full Code Allergies Info: NKDA           Current Medications (08/07/2015):  This is the current hospital active medication list Current Facility-Administered Medications  Medication Dose Route Frequency Provider Last Rate Last Dose  . 0.9 %  sodium chloride infusion   Intravenous Continuous Debbe Odea, MD 125 mL/hr at 08/07/15 1038    . 0.9 % irrigation (POUR BTL)    PRN Melrose Nakayama, MD   1,000 mL at 08/07/15 1350  . [facility]  Hold] antiseptic oral rinse (CPC / CETYLPYRIDINIUM CHLORIDE 0.05%) solution 7 mL  7 mL Mouth Rinse q12n4p Reubin Milan, MD      . Doug Sou Hold] benzonatate (TESSALON) capsule 100 mg  100 mg Oral TID PRN Debbe Odea, MD      . Doug Sou Hold] chlorhexidine (PERIDEX) 0.12 % solution 15 mL  15 mL Mouth Rinse BID Reubin Milan, MD   15 mL at 08/07/15 0935  . [MAR Hold] feeding supplement (JEVITY 1.2 CAL) liquid 355 mL  355 mL Per Tube TID WC & HS Debbe Odea, MD      . Doug Sou Hold] heparin injection 5,000 Units  5,000 Units Subcutaneous Q8H Reubin Milan, MD   5,000 Units at 08/07/15 [phone]  . [MAR Hold] hydrocortisone sodium succinate (SOLU-CORTEF) 100 MG injection 100 mg  100 mg Intravenous Q8H Saima Rizwan, MD   100 mg at 08/07/15 1037  . HYDROmorphone (DILAUDID) injection 0.25-0.5 mg  0.25-0.5 mg Intravenous Q5 min PRN Duane Boston, MD      . Doug Sou Hold] HYDROmorphone (DILAUDID) injection 1 mg  1 mg Intravenous Q3H PRN Reubin Milan, MD   1 mg at 08/07/15 1107  . lactated ringers infusion   Intravenous Continuous Peggy D Williford, CRNA      . [MAR Hold] ondansetron (ZOFRAN) injection 4 mg  4 mg Intravenous Q8H PRN [employer], PA-C      . [MAR Hold] pantoprazole sodium (PROTONIX) 40 mg/20 mL oral suspension 40 mg  40 mg Per Tube BID Debbe Odea, MD      . promethazine (PHENERGAN) injection 6.25-12.5 mg  6.25-12.5 mg Intravenous Q15 min PRN Duane Boston, MD      . Doug Sou Hold] sodium chloride flush (NS) 0.9 % injection 10-40 mL  10-40 mL Intracatheter Q12H Debbe Odea, MD   10 mL at 08/07/15 1000  . [MAR Hold] sodium chloride flush (NS) 0.9 % injection 10-40 mL  10-40 mL Intracatheter PRN Debbe Odea, MD      . Doug Sou Hold] sodium chloride flush (NS) 0.9 % injection 3 mL  3 mL Intravenous Q12H Reubin Milan, MD   0 mL at 08/07/15 0312  . [MAR Hold] sucralfate (CARAFATE) 1 GM/10ML suspension 1 g  1 g Oral Q6H Debbe Odea, MD       Facility-Administered Medications Ordered in Other Encounters  Medication Dose Route Frequency Provider Last Rate Last Dose  . albumin human 5 % solution    Continuous PRN Jinger Neighbors Williford, CRNA   Stopped at 08/07/15 1419  . bupivacaine 0.75% in dextrose 8.25% (intrathecal) (SENSORCAINE) 0.75-8.25 % injection   Intrathecal Anesthesia Intra-op Jinger Neighbors Williford, CRNA   1.5 mL at 08/07/15 1320  . ceFAZolin (ANCEF) IVPB 2 g/50 mL premix   Intravenous Anesthesia Intra-op Jinger Neighbors Williford, CRNA   2 g at 08/07/15 1322  . dexamethasone (DECADRON) injection    Anesthesia Intra-op Jinger Neighbors Williford, CRNA    10 mg at 08/07/15 1358  . fentaNYL (SUBLIMAZE) injection    Anesthesia Intra-op Jinger Neighbors Williford, CRNA   50 mcg at 08/07/15 1311  . midazolam (VERSED) 5 MG/5ML injection    Anesthesia Intra-op Jinger Neighbors Williford, CRNA   1 mg at 08/07/15 1315  . ondansetron [facility]) injection    Anesthesia Intra-op Jinger Neighbors Williford, CRNA   4 mg at 08/07/15 1421  . phenylephrine (NEO-SYNEPHRINE) 0.04 mg/mL in dextrose 5 % 250 mL infusion  10 mg  Continuous PRN Jinger Neighbors Williford, CRNA 22.5  mL/hr at 08/07/15 1348 15 mcg/min at 08/07/15 1348  . phenylephrine (NEO-SYNEPHRINE) injection    Anesthesia Intra-op Jinger Neighbors Williford, CRNA   80 mcg at 08/07/15 1338  . propofol (DIPRIVAN) 10 mg/mL bolus/IV push    Anesthesia Intra-op Jinger Neighbors Williford, CRNA   20 mg at 08/07/15 1318  . propofol (DIPRIVAN) 500 MG/50ML infusion    Continuous PRN Jinger Neighbors Williford, CRNA 16.2 mL/hr at 08/07/15 1422 50 mcg/kg/min at 08/07/15 1422     Discharge Medications: Please see discharge summary for a list of discharge medications.  Relevant Imaging Results:  Relevant Lab Results:   Additional Information SSN: [id number]  Rigoberto Noel, LCSW

## 2015-08-07 NOTE — Clinical Social Work Placement (Signed)
   CLINICAL SOCIAL WORK PLACEMENT  NOTE  Date:  08/07/2015  Patient Details  Name: Megan Sims MRN: [id number] Date of Birth: [birth year]  Clinical Social Work is seeking post-discharge placement for this patient at the [place] level of care (*CSW will initial, date and re-position this form in  chart as items are completed):  Yes   Patient/family provided with [facility] Work Department's list of facilities offering this level of care within the geographic area requested by the patient (or if unable, by the patient's family).  Yes   Patient/family informed of their freedom to choose among providers that offer the needed level of care, that participate in Medicare, Medicaid or managed care program needed by the patient, have an available bed and are willing to accept the patient.  Yes   Patient/family informed of [facility]'s ownership interest in [facility] and [facility], as well as of the fact that they are under no obligation to receive care at these facilities.  PASRR submitted to EDS on 08/07/15     PASRR number received on 08/07/15     Existing PASRR number confirmed on       FL2 transmitted to all facilities in geographic area requested by pt/family on 08/07/15     FL2 transmitted to all facilities within larger geographic area on       Patient informed that his/her managed care company has contracts with or will negotiate with certain facilities, including the following:            Patient/family informed of bed offers received.  Patient chooses bed at       Physician recommends and patient chooses bed at      Patient to be transferred to   on  .  Patient to be transferred to facility by       Patient family notified on   of transfer.  Name of family member notified:        PHYSICIAN Please prepare priority discharge summary, including medications, Please prepare prescriptions, Please sign FL2     Additional Comment:     _______________________________________________ Rigoberto Noel, LCSW 08/07/2015, 2:47 PM

## 2015-08-07 NOTE — Anesthesia Procedure Notes (Signed)
Spinal Patient location during procedure: OR End time: 08/07/2015 1:20 PM Staffing Resident/CRNA: ,  D Performed by: anesthesiologist and resident/CRNA  Preanesthetic Checklist Completed: patient identified, site marked, surgical consent, pre-op evaluation, timeout performed, IV checked, risks and benefits discussed and monitors and equipment checked Spinal Block Patient position: sitting Prep: Betadine Patient monitoring: heart rate, continuous pulse ox and blood pressure Injection technique: single-shot Needle Needle type: Spinocan  Needle gauge: 22 G Needle length: 9 cm Assessment Sensory level: T6 Additional Notes Expiration date of kit checked and confirmed. Patient tolerated procedure well, without complications.     

## 2015-08-07 NOTE — Transfer of Care (Signed)
Immediate Anesthesia Transfer of Care Note  Patient: Megan Sims  Procedure(s) Performed: Procedure(s): INTRAMEDULLARY (IM) NAIL INTERTROCHANTRIC (Left)  Patient Location: PACU  Anesthesia Type:Spinal  Level of Consciousness: awake, alert  and oriented  Airway & Oxygen Therapy: Patient Spontanous Breathing and Patient connected to face mask oxygen  Post-op Assessment: Report given to RN and Post -op Vital signs reviewed and stable  Post vital signs: Reviewed and stable  Last Vitals:  Filed Vitals:   08/07/15 1108 08/07/15 1445  BP: 152/77   Pulse: 101   Temp: 36.6 C 36.4 C  Resp: 16     Last Pain:  Filed Vitals:   08/07/15 1454  PainSc: Asleep      Patients Stated Pain Goal: 2 (61/84/85 [id number])  Complications: No apparent anesthesia complications

## 2015-08-07 NOTE — H&P (View-Only) (Signed)
Megan Nakayama, MD  Chauncey Cruel, PA-C  Megan Dolly, PA-C                                  [facility]                [address], [place], [place]  [address]   [place]            MRN:  [id number] DOB/SEX:  03/17/1955/female     CHIEF COMPLAINT:  Painful left leg  HISTORY: Megan Sims a 60 y.o. female with lung cancer and Paget's among other issues.  Fell yesterday at home and now has extreme pain and a broken femur.  Has been wheelchair dependent for a couple of weeks   PAST MEDICAL HISTORY: Patient Active Problem List   Diagnosis Date Noted  . Fall from slip, trip, or stumble 08/07/2015  . Left femoral shaft fracture ([place]) 08/07/2015  . 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 S/P gastrostomy ([place]) 08/04/2015  . Deficiency anemia 08/03/2015  . Dehydration 07/02/2015  . Candida esophagitis ([place])   . Anemia due to other cause   . Mucosal abnormality of stomach   . Pill esophagitis due to tetracycline 05/24/2015  . UTI (lower urinary tract infection) 05/24/2015  . Emphysema of lung ([place]) 05/05/2015  . Rash, skin 05/05/2015  . Severe sepsis ([place]) 04/10/2015  . AKI (acute kidney injury) ([place]) 04/10/2015  . Hyponatremia 04/10/2015  . Sepsis due to pneumonia ([place])   . CAP (community acquired pneumonia) 04/09/2015  . Skin ulcer ([place]) 11/18/2014  . Musculoskeletal chest pain 10/25/2014  . Pain in the chest   . Tachycardia   . Anemia due to antineoplastic chemotherapy 10/21/2014  . Cancer of upper lobe of right lung ([place]) 10/07/2014  . Oral thrush 09/24/2014  . Nicotine dependence 09/24/2014  . Chronic neck pain 09/24/2014  . Excessive weight loss 09/24/2014  . 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 Bilateral lung cancer ([place])   . Paget's disease of bone   . Dysphagia 09/05/2014  . Essential hypertension 09/05/2014   Past Medical History  Diagnosis Date  . Migraine   . Lung  abnormality   . Hypertension   . Pleurisy   . Cerebral aneurysm 2 brain surgeries 96 or 97  . Paget disease of bone   . Diarrhea   . Renal insufficiency     Patient states " no kidney problems  . S/P biopsy     of throat per patient.  . Oral thrush 09/24/2014  . Nicotine dependence 09/24/2014  . Chronic neck pain 09/24/2014  . Skin ulcer ([place]) 11/18/2014  . Emphysema of lung ([place]) 05/05/2015  . Malignant neoplasm of unknown origin ([place])   . Cancer [facility])     malignant neoplasm of unknown origin  . Left knee pain   . Left leg weakness 06/2015   Past Surgical History  Procedure Laterality Date  . Chest tube insertion    . Tubal ligation    . Cerebral aneurysm repair  96 or 97  . Colonoscopy      10 years ago in [place]   . Esophagogastroduodenoscopy  06/16/2015    Dr. Gala Romney: edentulous cricopharyngeus, esophageal plaques, biopsy consistent with candida  . Laryngoscopy  Jun 22, 2015    [facility], Dr. Erik Obey: normal oropharynx, no lesions, mobile vocal cords with good airway.  . Esophagogastroduodenoscopy  N/A 06/16/2015    Procedure: ESOPHAGOGASTRODUODENOSCOPY (EGD);  Surgeon: Daneil Dolin, MD;  [facility];  Service: Endoscopy;  Laterality: N/A;  . Peg placement N/A 07/06/2015    Procedure: PERCUTANEOUS ENDOSCOPIC GASTROSTOMY (PEG) PLACEMENT;  Surgeon: Danie Binder, MD;  Location: [facility];  Service: Endoscopy;  Laterality: N/A;  . Esophagogastroduodenoscopy N/A 07/06/2015    Procedure: ESOPHAGOGASTRODUODENOSCOPY (EGD);  Surgeon: Danie Binder, MD;  Location: [facility];  Service: Endoscopy;  Laterality: N/A;     MEDICATIONS:   Current facility-administered medications:  .  0.9 %  sodium chloride infusion, , Intravenous, Continuous, Reubin Milan, MD, Last Rate: 50 mL/hr at 08/07/15 0301 .  antiseptic oral rinse (CPC / CETYLPYRIDINIUM CHLORIDE 0.05%) solution 7 mL, 7 mL, Mouth Rinse, q12n4p, Reubin Milan, MD .  chlorhexidine (PERIDEX) 0.12 % solution 15  mL, 15 mL, Mouth Rinse, BID, Reubin Milan, MD .  heparin injection 5,000 Units, 5,000 Units, Subcutaneous, Q8H, Reubin Milan, MD, 5,000 Units at 08/07/15 [phone] .  HYDROmorphone (DILAUDID) injection 1 mg, 1 mg, Intravenous, Q3H PRN, Reubin Milan, MD, 1 mg at 08/07/15 0315 .  ondansetron (ZOFRAN) injection 4 mg, 4 mg, Intravenous, Q8H PRN, Hannah Muthersbaugh, PA-C .  sodium chloride flush (NS) 0.9 % injection 3 mL, 3 mL, Intravenous, Q12H, Reubin Milan, MD, 0 mL at 08/07/15 4403  ALLERGIES:  No Known Allergies  REVIEW OF SYSTEMS: REVIEWED IN DETAIL IN CHART  FAMILY HISTORY:   Family History  Problem Relation Age of Onset  . Hypertension    . Diabetes    . Kidney disease    . Cancer Mother     throat ca  . Cancer Maternal Grandmother     thyroid ca  . Colon cancer Neg Hx     SOCIAL HISTORY:   Social History  Substance Use Topics  . Smoking status: Former Smoker -- 0.50 packs/day for 34 years    Types: Cigarettes  . Smokeless tobacco: Never Used     Comment: HOWEVER, will smoke 1 cigarette if "tragic" event (death), or if she needs it for her nerves   . Alcohol Use: No     EXAMINATION: Vital signs in last 24 hours: Temp:  [97.6 F (36.4 C)-98.3 F (36.8 C)] 98.1 F (36.7 C) (07/15 0625) Pulse Rate:  [90-103] 95 (07/15 0625) Resp:  [14-18] 18 (07/15 0243) BP: (132-182)/(69-100) 169/95 mmHg (07/15 0625) SpO2:  [87 %-100 %] 100 % (07/15 0625) Weight:  [53.978 kg (119 lb)-54.522 kg (120 lb 3.2 oz)] 53.978 kg (119 lb) (07/15 0243)  BP 169/95 mmHg  Pulse 95  Temp(Src) 98.1 F (36.7 C) (Oral)  Resp 18  Ht '5\' 1"'$  (1.549 m)  Wt 53.978 kg (119 lb)  BMI 22.50 kg/m2  SpO2 100%  General Appearance:    Alert, cooperative, no distress, appears stated age  Head:    Normocephalic, without obvious abnormality, atraumatic  Eyes:    PERRL, conjunctiva/corneas clear, EOM's intact, fundi    benign, both eyes  Ears:    Normal TM's and external ear canals, both  ears  Nose:   Nares normal, septum midline, mucosa normal, no drainage    or sinus tenderness  Throat:   Lips, mucosa, and tongue normal; teeth and gums normal  Neck:   Supple, symmetrical, trachea midline, no adenopathy;    thyroid:  no enlargement/tenderness/nodules; no carotid   bruit or JVD  Back:     Symmetric, no curvature, ROM  normal, no CVA tenderness  Lungs:     Clear to auscultation bilaterally, respirations unlabored  Chest Wall:    No tenderness or deformity   Heart:    Regular rate and rhythm, S1 and S2 normal, no murmur, rub   or gallop  Breast Exam:    No tenderness, masses, or nipple abnormality  Abdomen:     Soft, non-tender, bowel sounds active all four quadrants,    no masses, no organomegaly  Genitalia:    Rectal:    Extremities:   Extremities normal, atraumatic, no cyanosis or edema  Pulses:   2+ and symmetric all extremities  Skin:   Skin color, texture, turgor normal, no rashes or lesions  Lymph nodes:   Cervical, supraclavicular, and axillary nodes normal  Neurologic:   CNII-XII intact, normal strength, sensation and reflexes    throughout    Musculoskeletal Exam:   Extreme pain on left leg motion, minimal swelling   DIAGNOSTIC STUDIES: Recent laboratory studies:  Recent Labs  08/02/15 1017 08/03/15 1205 08/06/15 2121  WBC 8.9 9.5 8.6  HGB 9.5 Repeated and Verified* 9.9* 9.5*  HCT 29.6* 30.8* 29.2*  PLT 287 305 321    Recent Labs  08/02/15 1017 08/06/15 2121  NA 139 136  K 4.2 4.3  CL  --  101  CO2 27 26  BUN 25.4 34*  CREATININE 0.9 0.83  GLUCOSE 97 179*  CALCIUM 8.9 8.9   Lab Results  Component Value Date   INR 0.99 08/06/2015   INR 1.04 04/10/2015   INR 0.97 10/08/2014     Recent Radiographic Studies :  Dg Lumbar Spine Complete  07/27/2015  CLINICAL DATA:  Paget's disease. History of lung cancer. Lumbar pain extending to the left pelvis and leg. EXAM: LUMBAR SPINE - COMPLETE 4+ VIEW COMPARISON:  CT 01/28/2015 FINDINGS: No evidence  of regional fracture. Chronic changes of Paget's disease affecting T11, L1, L4, L5 and the sacrum. Lower lumbar facet arthropathy could also contribute to pain. Also noted is aortic atherosclerosis. IMPRESSION: No acute finding. Chronic Paget's disease affecting T11, L1, L4, L5 and the sacrum. Lower lumbar facet arthropathy could also contribute to pain. Aortic atherosclerosis. Electronically Signed   By: Nelson Chimes M.D.   On: 07/27/2015 20:55   Dg Pelvis 1-2 Views  08/06/2015  CLINICAL DATA:  Left leg pain after a fall today. EXAM: PELVIS - 1-2 VIEW COMPARISON:  None. FINDINGS: There is a transverse minimally comminuted fracture of the proximal left femoral shaft below the level of lesser trochanter. There is impaction and medial angulation of the distal fracture fragment. Diffuse coarsening of trabecular pattern in the pelvis bilaterally consistent with Paget's disease. Patches changes also probably involving the sacrum although limited visualization due to overlying bowel. Calcifications in the pelvis consistent with phleboliths. IMPRESSION: Transverse mildly comminuted fracture of the proximal left femoral shaft with medial angulation and impaction of the fracture fragments. Changes of Paget's disease in the pelvis and sacrum. Electronically Signed   By: Lucienne Capers M.D.   On: 08/06/2015 22:36   Dg Tibia/fibula Left  08/06/2015  CLINICAL DATA:  60 year old female with fall and left leg pain. EXAM: LEFT TIBIA AND FIBULA - 2 VIEW; LEFT FOOT - COMPLETE 3+ VIEW; LEFT FEMUR 2 VIEWS COMPARISON:  Pelvic radiograph dated 08/06/2015 FINDINGS: There is a transverse fracture of the left femoral proximal diaphysis with mild medial angulation and impaction. No other acute fracture identified. There is no dislocation. There is bony expansion of the pelvis  with trabecular coarsening compatible with Paget's disease. There is also pagetoid changes of the tibia with cortical thickening and bowing of the bone. The  soft tissues appear unremarkable IMPRESSION: Mildly angulated and impacted fracture of the proximal left femoral diaphysis. Pagetoid changes of the pelvic bone and tibia. Electronically Signed   By: Anner Crete M.D.   On: 08/06/2015 22:40   Dg Ankle Complete Left  07/27/2015  CLINICAL DATA:  Lower extremity pain without injury. History of Paget's disease and lung cancer. EXAM: LEFT ANKLE COMPLETE - 3+ VIEW COMPARISON:  08/29/2009 FINDINGS: no abnormality at the ankle joint itself. Paget's disease of the tibia again seen with thickened well of an appearance of the cortex. No evidence of fracture or focal lesion. IMPRESSION: No acute finding.  Chronic changes of Paget's disease of the tibia. Electronically Signed   By: Nelson Chimes M.D.   On: 07/27/2015 20:51   Ct Chest W Contrast  08/02/2015  CLINICAL DATA:  History of moderately to poorly differentiated carcinoma of likely lung primary. Status post radiation to right chest supraclavicular area. EXAM: CT CHEST WITH CONTRAST TECHNIQUE: Multidetector CT imaging of the chest was performed during intravenous contrast administration. CONTRAST:  79m ISOVUE-300 IOPAMIDOL (ISOVUE-300) INJECTION 61% COMPARISON:  06/14/2015 FINDINGS: Mediastinum / Lymph Nodes: Prominent axillary lymph nodes bilaterally demonstrate central fatty hila but have progressed slightly in the interval. Right axillary lymph node measures up to 10 mm short axis. No mediastinal or hilar lymphadenopathy. Heart size is normal. Coronary artery calcification is noted. The esophagus has normal imaging features. There is some edema in the soft tissues of the lower neck and upper chest anteriorly. No definite supraclavicular lesion is identified. Lungs / Pleura: Lung windows again demonstrate emphysema. Interstitial and irregular confluent airspace opacity in the posterior right upper lobe is stable in the interval. The patient has developed progressive right middle lobe collapse since prior CT  without a discrete centrally obstructing lesion evident. 4 mm left lower lobe pulmonary nodule is stable. No pulmonary edema or pleural effusion. Upper Abdomen: Gastrostomy tube again noted. Atherosclerotic calcification noted in the wall of the incompletely visualized abdominal aorta. 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 IMPRESSION: 1. Interval development of complete right middle lobe collapse without a discrete central obstructing lesion evident. 2. The irregular ill-defined central airspace opacity identified previously in the posterior right upper lobe is not substantially changed in the interval. 3. Coronary artery and abdominal aortic atherosclerosis. 4. 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 Electronically Signed   By: EMisty StanleyM.D.   On: 08/02/2015 15:28   [place] Img Lower Unilateral Left  07/28/2015  CLINICAL DATA:  Left lower extremity pain for 1 week. Evaluate for DVT. EXAM: Left LOWER EXTREMITY VENOUS DOPPLER ULTRASOUND TECHNIQUE: Gray-scale sonography with graded compression, as well as color Doppler and duplex ultrasound were performed to evaluate the lower extremity deep venous systems from the level of the common femoral vein and including the common femoral, femoral, profunda femoral, popliteal and calf veins including the posterior tibial, peroneal and gastrocnemius veins when visible. The superficial great saphenous vein was also interrogated. Spectral Doppler was utilized to evaluate flow at rest and with distal augmentation maneuvers in the common femoral, femoral and popliteal veins. COMPARISON:  08/29/2009 FINDINGS: Contralateral Common Femoral Vein: Respiratory phasicity is normal and symmetric with the symptomatic side. No evidence of thrombus. Normal compressibility. Common Femoral Vein: No  evidence of thrombus. Saphenofemoral Junction: No evidence of thrombus. Profunda Femoral  Vein: No evidence of thrombus. Femoral Vein: No evidence of thrombus. Popliteal Vein: No evidence of thrombus. Calf Veins: No evidence of thrombus. Superficial Great Saphenous Vein: No evidence of thrombus. IMPRESSION: No evidence of left lower extremity deep venous thrombosis. Electronically Signed   By: Monte Fantasia M.D.   On: 07/28/2015 10:49   Dg Chest Port 1 View  08/07/2015  CLINICAL DATA:  Preoperative. History of lung abnormality, hypertension, emphysema, former smoker. EXAM: PORTABLE CHEST 1 VIEW COMPARISON:  CT chest 08/02/2015.  Chest 06/14/2015. FINDINGS: 11 mm diameter spiculated nodule in the right mid lung. This is smaller than the opacity seen on prior chest radiograph. Decreasing size may indicate that this is resolving inflammatory process. However, based on morphology the lesion is indeterminate and malignancy should be excluded. Left lung is clear. Power port type central venous catheter with tip over the mid SVC region. Normal heart size and pulmonary vascularity. Calcified and tortuous aorta. IMPRESSION: 11 mm spiculated nodule in the right mid lung. The opacity is smaller than on prior study, possibly indicating resolving inflammatory process. Malignancy should still be excluded however. Follow-up until resolution is recommended. Electronically Signed   By: Lucienne Capers M.D.   On: 08/07/2015 00:21   Dg Knee Complete 4 Views Left  07/27/2015  CLINICAL DATA:  History of Paget's disease. Pain at the left lower extremity. No known injury. EXAM: LEFT KNEE - COMPLETE 4+ VIEW COMPARISON:  08/29/2009 FINDINGS: No joint effusion. No fracture, dislocation or degenerative change. No focal bone lesion in this region. IMPRESSION: Negative. Electronically Signed   By: Nelson Chimes M.D.   On: 07/27/2015 20:53   Dg Foot Complete Left  08/06/2015  CLINICAL DATA:  60 year old female with fall and left leg pain. EXAM: LEFT TIBIA AND FIBULA - 2 VIEW; LEFT FOOT - COMPLETE 3+ VIEW; LEFT FEMUR 2 VIEWS  COMPARISON:  Pelvic radiograph dated 08/06/2015 FINDINGS: There is a transverse fracture of the left femoral proximal diaphysis with mild medial angulation and impaction. No other acute fracture identified. There is no dislocation. There is bony expansion of the pelvis with trabecular coarsening compatible with Paget's disease. There is also pagetoid changes of the tibia with cortical thickening and bowing of the bone. The soft tissues appear unremarkable IMPRESSION: Mildly angulated and impacted fracture of the proximal left femoral diaphysis. Pagetoid changes of the pelvic bone and tibia. Electronically Signed   By: Anner Crete M.D.   On: 08/06/2015 22:40   Dg Hip Unilat With Pelvis 2-3 Views Left  07/27/2015  CLINICAL DATA:  Paget's disease. Pain of the left lower extremity. History of lung cancer. EXAM: DG HIP (WITH OR WITHOUT PELVIS) 2-3V LEFT COMPARISON:  None. FINDINGS: Paget's disease diffusely involving the pelvis an the sacrum. No involvement of the proximal femurs. No evidence of fracture or focal destructive lesion. IMPRESSION: Paget's disease diffusely and chronically affecting the bones of the pelvis and the sacrum. No acute finding. Hip joint appears unremarkable. Electronically Signed   By: Nelson Chimes M.D.   On: 07/27/2015 20:52   Dg Femur Min 2 Views Left  08/06/2015  CLINICAL DATA:  60 year old female with fall and left leg pain. EXAM: LEFT TIBIA AND FIBULA - 2 VIEW; LEFT FOOT - COMPLETE 3+ VIEW; LEFT FEMUR 2 VIEWS COMPARISON:  Pelvic radiograph dated 08/06/2015 FINDINGS: There is a transverse fracture of the left femoral proximal diaphysis with mild medial angulation and impaction. No other acute  fracture identified. There is no dislocation. There is bony expansion of the pelvis with trabecular coarsening compatible with Paget's disease. There is also pagetoid changes of the tibia with cortical thickening and bowing of the bone. The soft tissues appear unremarkable IMPRESSION: Mildly  angulated and impacted fracture of the proximal left femoral diaphysis. Pagetoid changes of the pelvic bone and tibia. Electronically Signed   By: Anner Crete M.D.   On: 08/06/2015 22:40    ASSESSMENT:  Left femur fracture   PLAN:  Despite medical issues this requires surgical stabilization.  Hope we can do this later today to enable her to sit and hopefully get OOB.  Not quite sure why she has been in wheelchair but she may have been developing stress fracture which completed itself.  Will need stress steroids around surgery. Reviewed risks of surgery which are elevated but really no good alternative.  Gloriana Piltz G 08/07/2015, 7:59 AM

## 2015-08-08 ENCOUNTER — Encounter: Payer: Self-pay | Admitting: Hematology and Oncology

## 2015-08-08 DIAGNOSIS — M889 Osteitis deformans of unspecified bone: Secondary | ICD-10-CM

## 2015-08-08 DIAGNOSIS — I10 Essential (primary) hypertension: Secondary | ICD-10-CM

## 2015-08-08 LAB — BASIC METABOLIC PANEL
ANION GAP: 5 (ref 5–15)
BUN: 33 mg/dL — ABNORMAL HIGH (ref 6–20)
CALCIUM: 8.3 mg/dL — AB (ref 8.9–10.3)
CO2: 29 mmol/L (ref 22–32)
Chloride: 101 mmol/L (ref 101–111)
Creatinine, Ser: 0.85 mg/dL (ref 0.44–1.00)
GFR calc Af Amer: >60 mL/min (ref >60)
GLUCOSE: 128 mg/dL — AB (ref 65–99)
POTASSIUM: 4.7 mmol/L (ref 3.5–5.1)
SODIUM: 135 mmol/L (ref 135–145)

## 2015-08-08 LAB — CBC
HCT: 26.1 % — ABNORMAL LOW (ref 36.0–46.0)
Hemoglobin: 8.3 g/dL — ABNORMAL LOW (ref 12.0–15.0)
MCH: 30 pg (ref 26.0–34.0)
MCHC: 31.8 g/dL (ref 30.0–36.0)
MCV: 94.2 fL (ref 78.0–100.0)
PLATELETS: 253 10*3/uL (ref 150–400)
RBC: 2.77 MIL/uL — AB (ref 3.87–5.11)
RDW: 18.9 % — ABNORMAL HIGH (ref 11.5–15.5)
WBC: 9.6 10*3/uL (ref 4.0–10.5)

## 2015-08-08 NOTE — Evaluation (Signed)
Occupational Therapy Evaluation Patient Details Name: Megan Sims MRN: [id number] DOB: Jan 03, 1956 Today's Date: 08/08/2015    History of Present Illness 60 yo female admitted with Left femoral shaft fx. S/P L hip trochanteric nail 08/07/15.  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    Clinical Impression   Pt admitted with LLE fx s/p fall. Pt currently with functional limitations due to the deficits listed below (see OT Problem List). Pt will benefit from skilled OT to increase their safety and independence with ADL and functional mobility for ADL to facilitate discharge to venue listed below.     Follow Up Recommendations  SNF    Equipment Recommendations  None recommended by OT       Precautions / Restrictions Precautions Precautions: Fall Restrictions Weight Bearing Restrictions: Yes LLE Weight Bearing: Partial weight bearing      Mobility Bed Mobility Overal bed mobility: Needs Assistance Bed Mobility: Supine to Sit     Supine to sit: Mod assist;+2 for physical assistance;+2 for safety/equipment;HOB elevated     General bed mobility comments: pt in chair  Transfers Overall transfer level: Needs assistance   Transfers: Lateral/Scoot Transfers          Lateral/Scoot Transfers: Mod assist;+2 physical assistance;+2 safety/equipment General transfer comment: did not perform    Balance Overall balance assessment: Needs assistance;History of Falls   Sitting balance-Leahy Scale: Good                                      ADL Overall ADL's : Needs assistance/impaired Eating/Feeding: Set up;Sitting   Grooming: Set up;Sitting                                 General ADL Comments: Family present.  Educated pt and family on role of OT, PT and rehab process.  Pt and family agree pt will need SNF               Pertinent Vitals/Pain Pain Assessment: 0-10 Pain  Score: 3  Pain Location: sitting in chair LLE at rest Pain Descriptors / Indicators: Sore Pain Intervention(s): Monitored during session     Hand Dominance     Extremity/Trunk Assessment Upper Extremity Assessment Upper Extremity Assessment: Overall WFL for tasks assessed   Lower Extremity Assessment Lower Extremity Assessment: LLE deficits/detail LLE Deficits / Details: noted bowing of tibia LLE: Unable to fully assess due to pain   Cervical / Trunk Assessment Cervical / Trunk Assessment: Normal   Communication Communication Communication: No difficulties   Cognition Arousal/Alertness: Awake/alert Behavior During Therapy: WFL for tasks assessed/performed Overall Cognitive Status: Within Functional Limits for tasks assessed                                Home Living Family/patient expects to be discharged to:: Skilled nursing facility Living Arrangements: Spouse/significant other Available Help at Discharge: Family Type of Home: House Home Access: Stairs to enter Technical brewer of Steps: 3 Entrance Stairs-Rails: Right;Left Home Layout: One level               Home Equipment: Wheelchair - [employer] - 2 wheels;Cane - single point;Bedside commode;Hospital bed          Prior Functioning/Environment  Comments: Independent with ambulation up until ~1-2 weeks ago. Pt then began using wheelchair due to leg swellling.    OT Diagnosis: Generalized weakness;Acute pain   OT Problem List: Decreased strength;Decreased activity tolerance;Decreased safety awareness;Decreased knowledge of use of DME or AE;Decreased knowledge of precautions   OT Treatment/Interventions: Self-care/ADL training;DME and/or AE instruction;Patient/family education    OT Goals(Current goals can be found in the care plan section) Acute Rehab OT Goals Patient Stated Goal: go to a good rehab and get well OT Goal Formulation: With patient Time For Goal  Achievement: 08/22/15 Potential to Achieve Goals: Good ADL Goals Pt Will Perform Grooming: with set-up;sitting Pt Will Transfer to Toilet: with min assist;ambulating;bedside commode Pt Will Perform Toileting - Clothing Manipulation and hygiene: with min assist;sit to/from stand  OT Frequency: Min 2X/week   Barriers to D/C: Decreased caregiver support             End of Session Nurse Communication: Mobility status  Activity Tolerance: Patient tolerated treatment well Patient left: in chair;with call bell/phone within reach;with family/visitor present   Time: 1215-1230 OT Time Calculation (min): 15 min Charges:  OT General Charges $OT Visit: 1 Procedure OT Evaluation $OT Eval Moderate Complexity: 1 Procedure G-Codes:    Payton Mccallum D August 09, 2015, 1:05 PM

## 2015-08-08 NOTE — Progress Notes (Signed)
Initial Nutrition Assessment  INTERVENTION:   TF recommendation: Please consult RD for Tube feeding management Provide 2 cans of Jevity 1.2 every 4 hours. (0800, 1200, 1600) Per pt request, provide 1 can, then 1 hour later provide second can.  Recommend 60 ml before and after each bolus feeds. Tube feeding regimen provides  1710 kcal, 79 g protein and 1506 ml H2O.  RD to continue to monitor  NUTRITION DIAGNOSIS:   Increased nutrient needs related to cancer and cancer related treatments as evidenced by estimated needs.  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 Fell yesterday at home and now has extreme pain and a broken femur. Has been wheelchair dependent for a couple of weeks 7/15: s/p Procedure(s) (LRB): INTRAMEDULLARY (IM) NAIL INTERTROCHANTRIC (Left)  Patient followed by [facility], last seen 6/30. Pt with lung cancer, receiving chemoradiation treatments. Pt with chronic dysphagia. PEG was placed 6/13.   Patient reports Home TF regimen of Jevity 1.2, 6 cans daily. Pt would infuse 1.5 cans QID at home. Unable to infuse 1/2 cans, pt reports she has been receiving 2 cans at each feeding and she has been feeling too full. She requests the 2 cans be separate by at least 1/2 -1 hour. She believes she will tolerate feeds better. At this time, RD has no tube feeding management privileges.   Per weight history, pt's weight has remained stable. Her weight has increased since 5/23.  Pt with moderate depletion in hands.  Labs reviewed. Medications reviewed.  Diet Order:  Diet NPO time specified  Skin:  Wound (see comment) (Hip incision from 7/15)  Last BM:  7/14  Height:   Ht Readings from Last 1 Encounters:  08/07/15 '5\' 1"'$  (1.549 m)    Weight:    Wt Readings from Last 1 Encounters:  08/07/15 119 lb (53.978 kg)    Ideal Body Weight:  47.7 kg  BMI:  Body mass index is 22.5 kg/(m^2).  Estimated Nutritional Needs:   Kcal:  1600-1800  Protein:  70-80g  Fluid:  1.8L/day  EDUCATION NEEDS:   No education needs identified at this time  Clayton Bibles, MS, RD, LDN Pager: [phone] After Hours Pager: [phone]

## 2015-08-08 NOTE — Assessment & Plan Note (Signed)
Given those CT scan showed no definitive signs of cancer recurrence, I'm concerned about the complete right middle lobe collapse. I will get a scan reviewed at the next ENT tumor board next week for further discussion

## 2015-08-08 NOTE — Assessment & Plan Note (Signed)
CK level is high and she has classic signs of skin rashes with recent diagnosis of malignancy I will send referral to Rheumatology at [facility] I recommend increasing the dose of prednisone to 60 mg daily I will prescribe omeprazole and carafate in view of recent diagnosis of gastric ulcer

## 2015-08-08 NOTE — Evaluation (Signed)
Physical Therapy Evaluation Patient Details Name: Megan Sims MRN: [id number] DOB: Feb 12, 1955 Today's Date: 08/08/2015   History of Present Illness  60 yo female admitted with Left femoral shaft fx. S/P L hip trochanteric nail 08/07/15.  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   Clinical Impression  On eval, pt required Mod assist +2 for mobility-scoot transfer, bed to dropped arm recliner. Mod-severe pain with activity (pt was pre-medicated). Mod multimodal cueing for safety, technique, adherence to precautions, encouragement to complete task. Recommend SNF for continued rehab.     Follow Up Recommendations SNF    Equipment Recommendations       Recommendations for Other Services OT consult     Precautions / Restrictions Precautions Precautions: Fall Restrictions Weight Bearing Restrictions: Yes LLE Weight Bearing: Partial weight bearing      Mobility  Bed Mobility Overal bed mobility: Needs Assistance Bed Mobility: Supine to Sit     Supine to sit: Mod assist;+2 for physical assistance;+2 for safety/equipment;HOB elevated     General bed mobility comments: Assist for trunk and bil LEs. Increased time. Utilized bedpad for scooting, positioning. Multimodal cueing for safety, technique.   Transfers Overall transfer level: Needs assistance   Transfers: Lateral/Scoot Transfers          Lateral/Scoot Transfers: Mod assist;+2 physical assistance;+2 safety/equipment General transfer comment: Bed to recliner scoot transfer. Utilized bedpad to aid with scooting. Increased time. Multimodal cues for safety, technique.   Ambulation/Gait             General Gait Details: NT  Stairs            Wheelchair Mobility    Modified Rankin (Stroke Patients Only)       Balance Overall balance assessment: Needs assistance;History of Falls   Sitting balance-Leahy Scale: Good                                        Pertinent Vitals/Pain Pain Assessment: 0-10 Pain Score: 9  Pain Location: L LE at rest    [place] expects to be discharged to:: Skilled nursing facility Living Arrangements: Spouse/significant other Available Help at Discharge: Family Type of Home: House Home Access: Stairs to enter Entrance Stairs-Rails: Psychiatric nurse of Steps: 3 Home Layout: One level Home Equipment: Wheelchair - [employer] - 2 wheels;Cane - single point;Bedside commode;Hospital bed      Prior Function           Comments: Independent with ambulation up until ~1-2 weeks ago. Pt then began using wheelchair due to leg swellling.     Hand Dominance        Extremity/Trunk Assessment   Upper Extremity Assessment: Overall WFL for tasks assessed           Lower Extremity Assessment: LLE deficits/detail   LLE Deficits / Details: noted bowing of tibia  Cervical / Trunk Assessment: Normal  Communication   Communication: No difficulties  Cognition Arousal/Alertness: Awake/alert Behavior During Therapy: WFL for tasks assessed/performed Overall Cognitive Status: Within Functional Limits for tasks assessed                      General Comments      Exercises        Assessment/Plan    PT Assessment Patient needs continued PT services  PT Diagnosis Difficulty walking;Generalized weakness;Acute  pain   PT Problem List Decreased strength;Decreased range of motion;Decreased activity tolerance;Decreased balance;Decreased mobility;Decreased knowledge of use of DME;Pain;Decreased knowledge of precautions  PT Treatment Interventions DME instruction;Gait training;Functional mobility training;Therapeutic activities;Patient/family education;Balance training;Therapeutic exercise   PT Goals (Current goals can be found in the Care Plan section) Acute Rehab PT Goals Patient Stated Goal: less pain PT Goal Formulation: With  patient Time For Goal Achievement: 08/22/15 Potential to Achieve Goals: Good    Frequency Min 3X/week   Barriers to discharge        Co-evaluation               End of Session   Activity Tolerance: Patient limited by pain;Patient limited by fatigue Patient left: in chair;with call bell/phone within reach;with chair alarm set           Time: 6387-5643 PT Time Calculation (min) (ACUTE ONLY): 31 min   Charges:   PT Evaluation $PT Eval Moderate Complexity: 1 Procedure PT Treatments $Therapeutic Activity: 23-37 mins   PT G Codes:        Weston Anna, MPT Pager: [phone]

## 2015-08-08 NOTE — Progress Notes (Signed)
[place] OFFICE PROGRESS NOTE  Patient Care Team: Rosita Fire, MD as PCP - General (Internal Medicine) Heath Lark, MD as Consulting Physician (Hematology and Oncology) Jodi Marble, MD as Consulting Physician (Otolaryngology) Eppie Gibson, MD as Attending Physician (Radiation Oncology)  SUMMARY OF ONCOLOGIC HISTORY: Oncology History   Bilateral lung cancer   Staging form: Lung, AJCC 6th Edition     Clinical stage from 10/01/2014: Stage IIIB (T4(m), N3, M0) - Signed by Heath Lark, MD on 10/01/2014 Cancer of upper lobe of right lung   Staging form: Lung, AJCC 7th Edition     Clinical: Stage Unknown (TX, N3, M0) - Signed by Eppie Gibson, MD on 10/07/2014       Bilateral lung cancer ([place])   09/05/2014 - 09/12/2014 Hospital Admission The patient was admitted to the hospital for further management of possible skin reaction, dysphagia and supraclavicular lymphadenopathy   09/06/2014 Imaging CT scan of the neck showed malignant appearing superior mediastinal and right thoracic inlet lymphadenopathy   09/10/2014 Imaging CT scan of the abdomen and pelvis confirmed right supraclavicular and mediastinal lymphadenopathy, as described above but no other evidence of metastatic cancer    09/14/2014 Procedure Flexible endoscopy in the ENT office failed to reveal any primary in the head and neck region   09/16/2014 Imaging She failed barium swallow. 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   01/28/2015 Imaging CT scan showed no active disease   04/30/2015 Imaging Mild patchy/ground-glass opacity in the right upper lung lobe, favored to reflect mild residual pneumonia. Associated 10 x 15 mm irregular nodular opacity in the right upper lobe, new, likely infectious.    06/14/2015 Imaging CT neck nonspecific inflammation. CT chest showed Small focal opacities along the right minor fissure and at the right upper lobe are similar in appearance to the prior CTA, though less hazy   06/16/2015 Procedure She had EGD and biopsy. EGD showed edematous cricopharyngeus   07/07/2015 Surgery She has PEG tube placement   07/07/2015 Procedure EGD showed moderate duodenitis and ulcer   08/02/2015 Imaging [id number] development of complete right middle lobe collapse without a discrete central obstructing lesion evident. The irregular ill-defined central airspace opacity identified previously in the posterior right upper lobe is not substantially changed    INTERVAL HISTORY: Please see below for problem oriented charting. She was seen at urgent request of her son on 08/06/15 due to progressive weakness She was started on prednisone with minimal improvement Her left ankle is better Her skin rash is worse/no change  REVIEW OF SYSTEMS:  All other systems were reviewed with the patient and are negative.  I have reviewed the past medical history, past surgical history, social history and family history with the patient and they are unchanged from previous note.  ALLERGIES:  has No Known Allergies.  MEDICATIONS:  No  current facility-administered medications for this visit.   No current outpatient prescriptions on file.   Facility-Administered Medications Ordered in Other Visits   Medication Dose Route Frequency Provider Last Rate Last Dose  . acetaminophen (TYLENOL) tablet 650 mg  650 mg Oral Q6H PRN Loni Dolly, PA-C       Or  . acetaminophen (TYLENOL) suppository 650 mg  650 mg Rectal Q6H PRN Loni Dolly, PA-C      . antiseptic oral rinse (CPC / CETYLPYRIDINIUM CHLORIDE 0.05%) solution 7 mL  7 mL Mouth Rinse q12n4p Reubin Milan, MD   7 mL at 08/07/15 1200  . aspirin EC tablet 325 mg  325 mg Oral Q breakfast Loni Dolly, PA-C   325 mg at 08/08/15 2703  . benzonatate (TESSALON) capsule 100 mg  100 mg Oral TID PRN Debbe Odea, MD      . chlorhexidine (PERIDEX) 0.12 % solution 15 mL  15 mL Mouth Rinse BID Reubin Milan, MD   15 mL at 08/07/15 2155  . feeding supplement (JEVITY 1.2 CAL) liquid 355 mL  355 mL Per Tube TID WC & HS Debbe Odea, MD   355 mL at 08/08/15 0833  . heparin injection 5,000 Units  5,000 Units Subcutaneous Q8H Reubin Milan, MD   5,000 Units at 08/08/15 [phone]  . HYDROcodone-acetaminophen (NORCO/VICODIN) 5-325 MG per tablet 1-2 tablet  1-2 tablet Oral Q6H PRN Loni Dolly, PA-C   1 tablet at 08/07/15 2257  . hydrocortisone sodium succinate (SOLU-CORTEF) 100 MG injection 100 mg  100 mg Intravenous Q8H Debbe Odea, MD   100 mg at 08/08/15 0225  . HYDROmorphone (DILAUDID) injection 1 mg  1 mg Intravenous Q3H PRN Reubin Milan, MD   1 mg at 08/07/15 2108  . menthol-cetylpyridinium (CEPACOL) lozenge 3 mg  1 lozenge Oral PRN Loni Dolly, PA-C       Or  . phenol (CHLORASEPTIC) mouth spray 1 spray  1 spray Mouth/Throat PRN Loni Dolly, PA-C      . methocarbamol (ROBAXIN) tablet 500 mg  500 mg Oral Q6H PRN Loni Dolly, PA-C       Or  . methocarbamol (ROBAXIN) 500 mg in dextrose 5 % 50 mL IVPB  500 mg Intravenous Q6H PRN Loni Dolly, PA-C   500 mg at 08/07/15 1939  . metoCLOPramide (REGLAN) tablet 5-10 mg  5-10 mg Oral Q8H PRN Loni Dolly, PA-C       Or  . metoCLOPramide (REGLAN) injection 5-10 mg  5-10 mg Intravenous Q8H PRN Loni Dolly,  PA-C      . ondansetron [facility]) tablet 4 mg  4 mg Oral Q6H PRN Loni Dolly, PA-C       Or  . ondansetron [facility]) injection 4 mg  4 mg Intravenous Q6H PRN Loni Dolly, PA-C      . pantoprazole sodium (PROTONIX) 40 mg/20 mL oral suspension 40 mg  40 mg Per Tube BID Debbe Odea, MD   40 mg at 08/07/15 2158  . sodium chloride flush (NS) 0.9 % injection 10-40 mL  10-40 mL Intracatheter Q12H Debbe Odea, MD   10 mL at 08/07/15 1000  . sodium chloride flush (NS) 0.9 % injection 10-40 mL  10-40 mL Intracatheter PRN Debbe Odea, MD   10 mL at 08/08/15 0501  . sodium chloride flush (NS) 0.9 % injection 3 mL  3 mL Intravenous Q12H Reubin Milan, MD   0 mL at 08/07/15 0312  . sucralfate (CARAFATE) 1 GM/10ML suspension 1 g  1 g Oral Q6H Debbe Odea, MD   1 g at 08/08/15 0226    PHYSICAL EXAMINATION: ECOG PERFORMANCE STATUS: 2 - Symptomatic, <50% confined to bed  Filed Vitals:   08/06/15 1530  BP: 158/71  Pulse: 103  Temp: 98.2 F (36.8 C)  Resp: 18   Filed Weights   08/06/15 1530  Weight: 120 lb 3.2 oz (54.522 kg)    GENERAL:alert, no distress and comfortable. She was sitting on a wheelchair SKIN: significant rash on her face EYES: normal, Conjunctiva are pink and non-injected, sclera clear Musculoskeletal:no cyanosis of digits and no clubbing  NEURO: alert & oriented x 3 with fluent speech,  LABORATORY DATA:  I have reviewed the data as listed    Component Value Date/Time   NA 135 08/08/2015 0500   NA 139 08/02/2015 1017   K 4.7 08/08/2015 0500   K 4.2 08/02/2015 1017   CL 101 08/08/2015 0500   CO2 29 08/08/2015 0500   CO2 27 08/02/2015 1017   GLUCOSE 128* 08/08/2015 0500   GLUCOSE 97 08/02/2015 1017   BUN 33* 08/08/2015 0500   BUN 25.4 08/02/2015 1017   CREATININE 0.85 08/08/2015 0500   CREATININE 0.9 08/02/2015 1017   CALCIUM 8.3* 08/08/2015 0500   CALCIUM 8.9 08/02/2015 1017   PROT 6.0* 08/07/2015 0924   PROT 6.0* 08/02/2015 1017   ALBUMIN 3.0* 08/07/2015 0924    ALBUMIN 2.8* 08/02/2015 1017   AST 54* 08/07/2015 0924   AST 56* 08/02/2015 1017   ALT 21 08/07/2015 0924   ALT 16 08/02/2015 1017   ALKPHOS 68 08/07/2015 0924   ALKPHOS 78 08/02/2015 1017   BILITOT 0.4 08/07/2015 0924   BILITOT 0.37 08/02/2015 1017   GFRNONAA >60 08/08/2015 0500   GFRAA >60 08/08/2015 0500    No results found for: SPEP, UPEP  Lab Results  Component Value Date   WBC 9.6 08/08/2015   NEUTROABS 6.6 08/06/2015   HGB 8.3* 08/08/2015   HCT 26.1* 08/08/2015   MCV 94.2 08/08/2015   PLT 253 08/08/2015      Chemistry      Component Value Date/Time   NA 135 08/08/2015 0500   NA 139 08/02/2015 1017   K 4.7 08/08/2015 0500   K 4.2 08/02/2015 1017   CL 101 08/08/2015 0500   CO2 29 08/08/2015 0500   CO2 27 08/02/2015 1017   BUN 33* 08/08/2015 0500   BUN 25.4 08/02/2015 1017   CREATININE 0.85 08/08/2015 0500   CREATININE 0.9 08/02/2015 1017      Component Value Date/Time   CALCIUM 8.3* 08/08/2015 0500   CALCIUM 8.9 08/02/2015 1017   ALKPHOS 68 08/07/2015 0924   ALKPHOS 78 08/02/2015 1017   AST 54* 08/07/2015 0924   AST 56* 08/02/2015 1017   ALT 21 08/07/2015 0924   ALT 16 08/02/2015 1017   BILITOT 0.4 08/07/2015 0924   BILITOT 0.37 08/02/2015 1017      ASSESSMENT & PLAN:  Bilateral lung cancer ([place]) Given those CT scan showed no definitive signs of cancer recurrence, I'm concerned about the complete right middle lobe collapse. 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   All questions were  answered. The patient knows to call the clinic with any problems, questions or concerns. No barriers to learning was detected. I spent 15 minutes counseling the patient face to face. The total time spent in the appointment was 20 minutes and more than 50% was on counseling and review of test results     [facility], [place], MD 08/08/2015 9:27 AM

## 2015-08-08 NOTE — Progress Notes (Signed)
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 Found to have a fracture of left femur. Is s/p left hip intertroch nail on 7/15.   Subjective: No complaints today.   Assessment & Plan:   Principal Problem:   Left femoral shaft fracture - per ortho- partial weight bearing- aspirin  Active Problems:   Essential hypertension - Norvasc on hold    Bilateral lung cancer  - management per Heme/onc- her case was to be discussed this week at ENT tumor board this week    Paget's disease of bone - asymptomatic with normal alk phos - continue to follow    Anemia of chronic disease - hematology following  Dermatomyositis - this is the cause of her fall and fracture - on Prednisone which was recently started- dose was increased to 60 mg daily - she has taken 2 days at this dose - stress dose steroids for today - PT eval>>  will need SNF  Dysphagia - cont PEG feeds/ free water  PUD - PPI and Sucralfate- avoid NSAIDs  Chronic neck pain - Hycet PRN- she takes it anywhere from 2- 4 x day as needed   DVT prophylaxis: per ortho Code Status: full code Family Communication:  Disposition Plan:  Consultants:   ortho Procedures:   Intertroch nail- left hip  Antimicrobials:  Anti-infectives    Start     Dose/Rate Route Frequency Ordered Stop   08/07/15 2000  ceFAZolin (ANCEF) IVPB 2g/100 mL premix     2 g 200 mL/hr over 30 Minutes Intravenous Every 6 hours 08/07/15 1608 08/07/15 2134       Objective: Filed Vitals:   08/07/15 1600 08/07/15 2106 08/08/15 0210 08/08/15 0545  BP: 166/90  148/72 129/77 138/76  Pulse: 83 99 91 94  Temp: 97.6 F (36.4 C) 98.5 F (36.9 C) 98.2 F (36.8 C) 98 F (36.7 C)  TempSrc:  Oral Oral Oral  Resp: 12 16 16 14   Height:      Weight:      SpO2: 94% 94% 100% 100%    Intake/Output Summary (Last 24 hours) at 08/08/15 1448 Last data filed at 08/08/15 0549  Gross per 24 hour  Intake   2455 ml  Output    750 ml  Net   1705 ml   Filed Weights   08/07/15 0243  Weight: 53.978 kg (119 lb)    Examination: General exam: Appears comfortable  HEENT: PERRLA, oral mucosa moist, no sclera icterus or thrush Respiratory system: Clear to auscultation. Respiratory effort normal. Cardiovascular system: S1 & S2 heard, RRR.  No murmurs  Gastrointestinal system: Abdomen soft, non-tender, nondistended. Normal bowel sound. No organomegaly Central nervous system: Alert and oriented. No focal neurological deficits. Extremities: No cyanosis, clubbing or edema- has swelling of left thigh Skin: erythema and swelling in various areas of the body and with vitiligo in places Psychiatry:  Mood & affect appropriate.     Data Reviewed: I have personally reviewed following labs and imaging studies  CBC:  Recent Labs Lab 08/02/15 1017 08/03/15 1205 08/06/15 2121 08/07/15 0330 08/08/15 0500  WBC 8.9 9.5 8.6 12.4* 9.6  NEUTROABS  6.0 7.1* 6.6  --   --   HGB 9.5 Repeated and Verified* 9.9* 9.5* 9.7* 8.3*  HCT 29.6* 30.8* 29.2* 30.3* 26.1*  MCV 92.8 92.8 92.4 93.5 94.2  PLT 287 305 321 321 213   Basic Metabolic Panel:  Recent Labs Lab 08/02/15 1017 08/06/15 2121 08/07/15 0924 08/08/15 0500  NA 139 136 137 135  K 4.2 4.3 4.2 4.7  CL  --  101 103 101  CO2 27 26 29 29   GLUCOSE 97 179* 100* 128*  BUN 25.4 34* 32* 33*  CREATININE 0.9 0.83 0.75 0.85  CALCIUM 8.9 8.9 8.7* 8.3*   GFR: Estimated Creatinine Clearance: 53.1 mL/min (by C-G formula based on Cr of 0.85). Liver Function Tests:  Recent Labs Lab 08/02/15 1017 08/07/15 0924  AST 56*  54*  ALT 16 21  ALKPHOS 78 68  BILITOT 0.37 0.4  PROT 6.0* 6.0*  ALBUMIN 2.8* 3.0*   No results for input(s): LIPASE, AMYLASE in the last 168 hours. No results for input(s): AMMONIA in the last 168 hours. Coagulation Profile:  Recent Labs Lab 08/06/15 2121  INR 0.99   Cardiac Enzymes:  Recent Labs Lab 08/03/15 1205  CKTOTAL 459*   BNP (last 3 results) No results for input(s): PROBNP in the last 8760 hours. HbA1C: No results for input(s): HGBA1C in the last 72 hours. CBG: No results for input(s): GLUCAP in the last 168 hours. Lipid Profile: No results for input(s): CHOL, HDL, LDLCALC, TRIG, CHOLHDL, LDLDIRECT in the last 72 hours. Thyroid Function Tests: No results for input(s): TSH, T4TOTAL, FREET4, T3FREE, THYROIDAB in the last 72 hours. Anemia Panel: No results for input(s): VITAMINB12, FOLATE, FERRITIN, TIBC, IRON, RETICCTPCT in the last 72 hours. Urine analysis:    Component Value Date/Time   COLORURINE YELLOW 05/24/2015 1930   APPEARANCEUR CLEAR 05/24/2015 1930   LABSPEC >1.030* 05/24/2015 1930   PHURINE 5.0 05/24/2015 1930   GLUCOSEU NEGATIVE 05/24/2015 1930   HGBUR NEGATIVE 05/24/2015 1930   BILIRUBINUR NEGATIVE 05/24/2015 1930   KETONESUR NEGATIVE 05/24/2015 1930   PROTEINUR TRACE* 05/24/2015 1930   UROBILINOGEN 0.2 10/25/2014 0021   NITRITE NEGATIVE 05/24/2015 1930   LEUKOCYTESUR TRACE* 05/24/2015 1930   Sepsis Labs: @LABRCNTIP (procalcitonin:4,lacticidven:4) ) Recent Results (from the past 240 hour(s))  Body fluid culture     Status: None   Collection Time: 07/30/15  5:00 PM  Result Value Ref Range Status   Specimen Description SYNOVIAL LEFT KNEE  Final   Special Requests NONE  Final   Gram Stain   Final    FEW WBC PRESENT,BOTH PMN AND MONONUCLEAR NO ORGANISMS SEEN    Culture   Final    NO GROWTH 3 DAYS Performed at [facility]    Report Status 08/03/2015 FINAL  Final  TECHNOLOGIST REVIEW     Status: None   Collection Time:  08/02/15 10:17 AM  Result Value Ref Range Status   Technologist Review few targets  Final  Surgical pcr screen     Status: None   Collection Time: 08/07/15  7:46 AM  Result Value Ref Range Status   MRSA, PCR NEGATIVE NEGATIVE Final   Staphylococcus aureus NEGATIVE NEGATIVE Final    Comment:        The Xpert SA Assay (FDA approved for NASAL specimens in patients over 71 years of age), is one component of a comprehensive surveillance program.  Test performance has been validated by [facility] for patients greater than or equal to 24 year old. It is  not intended to diagnose infection nor to guide or monitor treatment.          Radiology Studies: Dg Pelvis 1-2 Views  08/06/2015  CLINICAL DATA:  Left leg pain after a fall today. EXAM: PELVIS - 1-2 VIEW COMPARISON:  None. FINDINGS: There is a transverse minimally comminuted fracture of the proximal left femoral shaft below the level of lesser trochanter. There is impaction and medial angulation of the distal fracture fragment. Diffuse coarsening of trabecular pattern in the pelvis bilaterally consistent with Paget's disease. Patches changes also probably involving the sacrum although limited visualization due to overlying bowel. Calcifications in the pelvis consistent with phleboliths. IMPRESSION: Transverse mildly comminuted fracture of the proximal left femoral shaft with medial angulation and impaction of the fracture fragments. Changes of Paget's disease in the pelvis and sacrum. Electronically Signed   By: Lucienne Capers M.D.   On: 08/06/2015 22:36   Dg Tibia/fibula Left  08/06/2015  CLINICAL DATA:  60 year old female with fall and left leg pain. EXAM: LEFT TIBIA AND FIBULA - 2 VIEW; LEFT FOOT - COMPLETE 3+ VIEW; LEFT FEMUR 2 VIEWS COMPARISON:  Pelvic radiograph dated 08/06/2015 FINDINGS: There is a transverse fracture of the left femoral proximal diaphysis with mild medial angulation and impaction. No other acute fracture  identified. There is no dislocation. There is bony expansion of the pelvis with trabecular coarsening compatible with Paget's disease. There is also pagetoid changes of the tibia with cortical thickening and bowing of the bone. The soft tissues appear unremarkable IMPRESSION: Mildly angulated and impacted fracture of the proximal left femoral diaphysis. Pagetoid changes of the pelvic bone and tibia. Electronically Signed   By: Anner Crete M.D.   On: 08/06/2015 22:40   Dg Chest Port 1 View  08/07/2015  CLINICAL DATA:  Preoperative. History of lung abnormality, hypertension, emphysema, former smoker. EXAM: PORTABLE CHEST 1 VIEW COMPARISON:  CT chest 08/02/2015.  Chest 06/14/2015. FINDINGS: 11 mm diameter spiculated nodule in the right mid lung. This is smaller than the opacity seen on prior chest radiograph. Decreasing size may indicate that this is resolving inflammatory process. However, based on morphology the lesion is indeterminate and malignancy should be excluded. Left lung is clear. Power port type central venous catheter with tip over the mid SVC region. Normal heart size and pulmonary vascularity. Calcified and tortuous aorta. IMPRESSION: 11 mm spiculated nodule in the right mid lung. The opacity is smaller than on prior study, possibly indicating resolving inflammatory process. Malignancy should still be excluded however. Follow-up until resolution is recommended. Electronically Signed   By: Lucienne Capers M.D.   On: 08/07/2015 00:21   Dg Foot Complete Left  08/06/2015  CLINICAL DATA:  60 year old female with fall and left leg pain. EXAM: LEFT TIBIA AND FIBULA - 2 VIEW; LEFT FOOT - COMPLETE 3+ VIEW; LEFT FEMUR 2 VIEWS COMPARISON:  Pelvic radiograph dated 08/06/2015 FINDINGS: There is a transverse fracture of the left femoral proximal diaphysis with mild medial angulation and impaction. No other acute fracture identified. There is no dislocation. There is bony expansion of the pelvis with  trabecular coarsening compatible with Paget's disease. There is also pagetoid changes of the tibia with cortical thickening and bowing of the bone. The soft tissues appear unremarkable IMPRESSION: Mildly angulated and impacted fracture of the proximal left femoral diaphysis. Pagetoid changes of the pelvic bone and tibia. Electronically Signed   By: Anner Crete M.D.   On: 08/06/2015 22:40   Dg C-arm 1-60 Min-no Report  08/07/2015  CLINICAL DATA: left hip fracture C-ARM 1-60 MINUTES Fluoroscopy was utilized by the requesting physician.  No radiographic interpretation.   Dg Hip Operative Unilat With Pelvis Left  08/07/2015  CLINICAL DATA:  IM nail of left leg. EXAM: DG C-ARM 1-60 MIN-NO REPORT; OPERATIVE LEFT HIP WITH PELVIS COMPARISON:  07/27/2015. FINDINGS: Four intraoperative spot fluoro film show placement of a dynamic hip screw with a long femoral intra medullary nail. Single distal interlocking screw evident. Nail crosses a proximal femur fracture with marked improvement in alignment compared to pre reduction films. IMPRESSION: Intraoperative assessment during ORIF for proximal femur fracture. No evidence for immediate hardware complications. Electronically Signed   By: Misty Stanley M.D.   On: 08/07/2015 15:35   Dg Femur Min 2 Views Left  08/06/2015  CLINICAL DATA:  60 year old female with fall and left leg pain. EXAM: LEFT TIBIA AND FIBULA - 2 VIEW; LEFT FOOT - COMPLETE 3+ VIEW; LEFT FEMUR 2 VIEWS COMPARISON:  Pelvic radiograph dated 08/06/2015 FINDINGS: There is a transverse fracture of the left femoral proximal diaphysis with mild medial angulation and impaction. No other acute fracture identified. There is no dislocation. There is bony expansion of the pelvis with trabecular coarsening compatible with Paget's disease. There is also pagetoid changes of the tibia with cortical thickening and bowing of the bone. The soft tissues appear unremarkable IMPRESSION: Mildly angulated and impacted  fracture of the proximal left femoral diaphysis. Pagetoid changes of the pelvic bone and tibia. Electronically Signed   By: Anner Crete M.D.   On: 08/06/2015 22:40      Scheduled Meds: . antiseptic oral rinse  7 mL Mouth Rinse q12n4p  . aspirin EC  325 mg Oral Q breakfast  . chlorhexidine  15 mL Mouth Rinse BID  . feeding supplement (JEVITY 1.2 CAL)  355 mL Per Tube TID WC & HS  . heparin  5,000 Units Subcutaneous Q8H  . hydrocortisone sod succinate (SOLU-CORTEF) inj  100 mg Intravenous Q8H  . pantoprazole sodium  40 mg Per Tube BID  . sodium chloride flush  10-40 mL Intracatheter Q12H  . sodium chloride flush  3 mL Intravenous Q12H  . sucralfate  1 g Oral Q6H   Continuous Infusions:     LOS: 1 day    Time spent in minutes: 21    [place], MD [facility] Pager: [email or web address] Password [facility] 08/08/2015, 2:48 PM

## 2015-08-08 NOTE — Care Management Note (Signed)
Case Management Note  Patient Details  Name: Megan Sims MRN: [id number] Date of Birth: Sep 25, 1955  Subjective/Objective:                  Left hip trochanteric nail  Action/Plan: CM spoke with the patient. She plans to be discharged to a SNF.   Expected Discharge Date:  08/09/15               Expected Discharge Plan:  [place]  In-House Referral:     Discharge planning Services  CM Consult  Post Acute Care Choice:  NA Choice offered to:     DME Arranged:  N/A DME Agency:  NA  HH Arranged:  NA HH Agency:  NA  Status of Service:  Completed, signed off  If discussed at [place] of Stay Meetings, dates discussed:    Additional Comments:  Apolonio Schneiders, RN 08/08/2015, 1:04 PM

## 2015-08-08 NOTE — Progress Notes (Signed)
Subjective: 1 Day Post-Op Procedure(s) (LRB): INTRAMEDULLARY (IM) NAIL INTERTROCHANTRIC (Left)  Activity level:  Partial WB left leg Diet tolerance:  PEG tube Voiding:  foley Patient reports pain as mild and moderate.    Objective: Vital signs in last 24 hours: Temp:  [97.5 F (36.4 C)-98.5 F (36.9 C)] 98 F (36.7 C) (07/16 0545) Pulse Rate:  [49-99] 94 (07/16 0545) Resp:  [12-22] 14 (07/16 0545) BP: (129-166)/(72-96) 138/76 mmHg (07/16 0545) SpO2:  [94 %-100 %] 100 % (07/16 0545)  Labs:  Recent Labs  08/06/15 2121 08/07/15 0330 08/08/15 0500  HGB 9.5* 9.7* 8.3*    Recent Labs  08/07/15 0330 08/08/15 0500  WBC 12.4* 9.6  RBC 3.24* 2.77*  HCT 30.3* 26.1*  PLT 321 253    Recent Labs  08/07/15 0924 08/08/15 0500  NA 137 135  K 4.2 4.7  CL 103 101  CO2 29 29  BUN 32* 33*  CREATININE 0.75 0.85  GLUCOSE 100* 128*  CALCIUM 8.7* 8.3*    Recent Labs  08/06/15 2121  INR 0.99    Physical Exam:  Neurologically intact ABD soft Neurovascular intact Sensation intact distally Intact pulses distally Dorsiflexion/Plantar flexion intact Incision: dressing C/D/I and no drainage No cellulitis present Compartment soft  Assessment/Plan:  1 Day Post-Op Procedure(s) (LRB): INTRAMEDULLARY (IM) NAIL INTERTROCHANTRIC (Left) Up with therapy  Continue on ASA '325mg'$  BID x 4 weeks post op for DVT prevention. Continue partial WB left leg. Follow up in office 2 weeks post op. Continue current pain meds.   Lilas Diefendorf, Larwance Sachs 08/08/2015, 11:43 AM

## 2015-08-09 ENCOUNTER — Encounter (HOSPITAL_COMMUNITY): Payer: Self-pay | Admitting: Speech Pathology

## 2015-08-09 ENCOUNTER — Telehealth (HOSPITAL_COMMUNITY): Payer: Self-pay | Admitting: Speech Pathology

## 2015-08-09 ENCOUNTER — Ambulatory Visit (HOSPITAL_COMMUNITY): Payer: Medicaid Other

## 2015-08-09 ENCOUNTER — Telehealth: Payer: Self-pay | Admitting: *Deleted

## 2015-08-09 ENCOUNTER — Ambulatory Visit (HOSPITAL_COMMUNITY): Payer: Medicaid Other | Admitting: Speech Pathology

## 2015-08-09 DIAGNOSIS — J9819 Other pulmonary collapse: Secondary | ICD-10-CM

## 2015-08-09 DIAGNOSIS — C3492 Malignant neoplasm of unspecified part of left bronchus or lung: Secondary | ICD-10-CM

## 2015-08-09 DIAGNOSIS — M360 Dermato(poly)myositis in neoplastic disease: Secondary | ICD-10-CM

## 2015-08-09 DIAGNOSIS — D62 Acute posthemorrhagic anemia: Secondary | ICD-10-CM | POA: Insufficient documentation

## 2015-08-09 DIAGNOSIS — R1310 Dysphagia, unspecified: Secondary | ICD-10-CM

## 2015-08-09 DIAGNOSIS — D539 Nutritional anemia, unspecified: Secondary | ICD-10-CM

## 2015-08-09 DIAGNOSIS — J438 Other emphysema: Secondary | ICD-10-CM

## 2015-08-09 DIAGNOSIS — Z931 Gastrostomy status: Secondary | ICD-10-CM

## 2015-08-09 DIAGNOSIS — C3491 Malignant neoplasm of unspecified part of right bronchus or lung: Secondary | ICD-10-CM

## 2015-08-09 LAB — BASIC METABOLIC PANEL
Anion gap: 4 — ABNORMAL LOW (ref 5–15)
BUN: 34 mg/dL — ABNORMAL HIGH (ref 6–20)
CHLORIDE: 102 mmol/L (ref 101–111)
CO2: 31 mmol/L (ref 22–32)
Calcium: 8 mg/dL — ABNORMAL LOW (ref 8.9–10.3)
Creatinine, Ser: 0.67 mg/dL (ref 0.44–1.00)
GFR calc non Af Amer: >60 mL/min (ref >60)
Glucose, Bld: 116 mg/dL — ABNORMAL HIGH (ref 65–99)
POTASSIUM: 4.4 mmol/L (ref 3.5–5.1)
SODIUM: 137 mmol/L (ref 135–145)

## 2015-08-09 LAB — PREPARE RBC (CROSSMATCH)

## 2015-08-09 LAB — CBC
HEMATOCRIT: 22.3 % — AB (ref 36.0–46.0)
HEMOGLOBIN: 7.1 g/dL — AB (ref 12.0–15.0)
MCH: 29.5 pg (ref 26.0–34.0)
MCHC: 31.8 g/dL (ref 30.0–36.0)
MCV: 92.5 fL (ref 78.0–100.0)
Platelets: 227 10*3/uL (ref 150–400)
RBC: 2.41 MIL/uL — AB (ref 3.87–5.11)
RDW: 18.9 % — ABNORMAL HIGH (ref 11.5–15.5)
WBC: 11.4 10*3/uL — AB (ref 4.0–10.5)

## 2015-08-09 MED ORDER — SODIUM CHLORIDE 0.9 % IV SOLN
Freq: Once | INTRAVENOUS | Status: AC
  Administered 2015-08-09: 09:00:00 via INTRAVENOUS

## 2015-08-09 NOTE — Telephone Encounter (Signed)
Referral placed to [facility] Rheumatology. Pt is currently in hospital after a fall and fractured femur. Will be seen by Rheumatology and Dermatology.  09/20/15 0900 Dr Simona Huh Ang, Rheumatology 09/20/15 1100 Dr Clovis Riley, Dermatology  Pt will be notified by RN

## 2015-08-09 NOTE — Telephone Encounter (Signed)
Speech Pathology (OUTPATIENT)  SLP called [name] [facility] this AM to inquire about missed dysphagia evaluation today and he informed SLP that pt broke her femur on Friday and was admitted to [facility]. He also stated that pt never received home health SLP due to Medicaid status so has not been seen by SLP since her last hospitalization when PEG was placed. SLP encouraged son to request SLP consult while admitted to [facility] to evaluate current status and provide education.   Thank you,  Genene Churn, CCC-SLP [phone]

## 2015-08-09 NOTE — Telephone Encounter (Signed)
-----   Message from Heath Lark, MD sent at 08/08/2015  9:30 AM EDT ----- Regarding: rheumatology consult at [place] send referral

## 2015-08-09 NOTE — Progress Notes (Signed)
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 Found to have a fracture of left femur. Is s/p left hip intertroch nail on 7/15.   Subjective: Pain in leg. No other complaints.   Assessment & Plan:   Principal Problem:   Left femoral shaft fracture - per ortho- partial weight bearing- aspirin - SNF referral  Active Problems:   Essential hypertension - Norvasc on hold    Bilateral lung cancer  - management per Heme/onc- her case was to be discussed this week at ENT tumor board this week    Paget's disease of bone - asymptomatic with normal alk phos - continue to follow    Anemia of chronic disease with acute blood loss due to fracture - transfuse 2 u PRBC today - hematology following  Dermatomyositis - this is the cause of her fall and fracture - on Prednisone which was recently started- dose was increased to 60 mg daily - she has taken 2 days at this dose - stress dose steroids for now - PT eval>>  will need SNF  Dysphagia - cont PEG feeds/ free water- tolerating well  PUD - PPI and Sucralfate- avoid NSAIDs  Chronic neck pain - Hycet PRN- she takes it anywhere from 2- 4 x day as needed   DVT prophylaxis: per ortho Code Status: full code Family Communication:  Disposition Plan:  Consultants:   ortho Procedures:   Intertroch nail- left hip  Antimicrobials:  Anti-infectives    Start     Dose/Rate Route Frequency Ordered Stop   08/07/15 2000  ceFAZolin (ANCEF) IVPB 2g/100 mL premix     2 g 200 mL/hr over 30 Minutes Intravenous Every 6 hours 08/07/15 1608 08/07/15 2134       Objective: Filed Vitals:   08/08/15 1547 08/08/15 2150 08/09/15 0124 08/09/15 0539  BP: 116/62 125/62 116/52 132/58  Pulse: 91 89 95 89  Temp: 98 F (36.7 C) 98.1 F (36.7 C) 98.7 F (37.1 C) 98.9 F (37.2 C)  TempSrc: Oral Oral Oral Oral  Resp: _0 Height:      Weight:      SpO2: 100% 100% 95% 100%    Intake/Output Summary (Last 24 hours) at 08/09/15 1448 Last data filed at 08/09/15 0600  Gross per 24 hour  Intake      0 ml  Output      0 ml  Net      0 ml   Filed Weights   08/07/15 0243  Weight: 53.978 kg (119 lb)    Examination: General exam: Appears comfortable  HEENT: PERRLA, oral mucosa moist, no sclera icterus or thrush Respiratory system: Clear to auscultation. Respiratory effort normal. Cardiovascular system: S1 & S2 heard, RRR.  No murmurs  Gastrointestinal system: Abdomen soft, non-tender, nondistended. Normal bowel sound. No organomegaly Central nervous system: Alert and oriented. No focal neurological deficits. Extremities: No cyanosis, clubbing or edema- has swelling of left thigh Skin: erythema and swelling in various areas of the body   Psychiatry:  Mood & affect appropriate.     Data Reviewed: I have personally reviewed following labs and imaging studies  CBC:  Recent Labs Lab 08/03/15 1205 08/06/15 2121 08/07/15 0330 08/08/15 0500 08/09/15 0545  WBC 9.5 8.6 12.4* 9.6 11.4*  NEUTROABS 7.1* 6.6  --   --   --   HGB 9.9* 9.5* 9.7* 8.3* 7.1*  HCT 30.8* 29.2* 30.3* 26.1* 22.3*  MCV 92.8 92.4 93.5 94.2 92.5  PLT 305 321 321 253 580   Basic Metabolic Panel:  Recent Labs Lab 08/06/15 2121 08/07/15 0924 08/08/15 0500 08/09/15 0545  NA 136 137 135 137  K 4.3 4.2 4.7 4.4  CL 101 103 101 102  CO2 _0 GLUCOSE 179* 100* 128* 116*  BUN 34* 32* 33* 34*  CREATININE 0.83 0.75 0.85 0.67  CALCIUM 8.9 8.7* 8.3* 8.0*   GFR: Estimated Creatinine Clearance: 56.4 mL/min (by C-G formula based on Cr of 0.67). Liver  Function Tests:  Recent Labs Lab 08/07/15 0924  AST 54*  ALT 21  ALKPHOS 68  BILITOT 0.4  PROT 6.0*  ALBUMIN 3.0*   No results for input(s): LIPASE, AMYLASE in the last 168 hours. No results for input(s): AMMONIA in the last 168 hours. Coagulation Profile:  Recent Labs Lab 08/06/15 2121  INR 0.99   Cardiac Enzymes:  Recent Labs Lab 08/03/15 1205  CKTOTAL 459*   BNP (last 3 results) No results for input(s): PROBNP in the last 8760 hours. HbA1C: No results for input(s): HGBA1C in the last 72 hours. CBG: No results for input(s): GLUCAP in the last 168 hours. Lipid Profile: No results for input(s): CHOL, HDL, LDLCALC, TRIG, CHOLHDL, LDLDIRECT in the last 72 hours. Thyroid Function Tests: No results for input(s): TSH, T4TOTAL, FREET4, T3FREE, THYROIDAB in the last 72 hours. Anemia Panel: No results for input(s): VITAMINB12, FOLATE, FERRITIN, TIBC, IRON, RETICCTPCT in the last 72 hours. Urine analysis:    Component Value Date/Time   COLORURINE YELLOW 05/24/2015 1930   APPEARANCEUR CLEAR 05/24/2015 1930   LABSPEC >1.030* 05/24/2015 1930   PHURINE 5.0 05/24/2015 1930   GLUCOSEU NEGATIVE 05/24/2015 1930   HGBUR NEGATIVE 05/24/2015 1930   BILIRUBINUR NEGATIVE 05/24/2015 1930   KETONESUR NEGATIVE 05/24/2015 1930   PROTEINUR TRACE* 05/24/2015 1930   UROBILINOGEN 0.2 10/25/2014 0021   NITRITE NEGATIVE 05/24/2015 1930   LEUKOCYTESUR TRACE* 05/24/2015 1930   Sepsis Labs: _1 (procalcitonin:4,lacticidven:4) ) Recent Results (from the past 240 hour(s))  Body fluid culture     Status: None   Collection Time: 07/30/15  5:00 PM  Result Value Ref Range Status   Specimen Description SYNOVIAL LEFT KNEE  Final   Special Requests NONE  Final   Gram Stain   Final    FEW WBC PRESENT,BOTH PMN AND MONONUCLEAR NO ORGANISMS SEEN    Culture   Final    NO GROWTH 3 DAYS Performed at [facility]    Report Status 08/03/2015 FINAL  Final  TECHNOLOGIST REVIEW      Status: None   Collection Time: 08/02/15 10:17 AM  Result Value Ref Range Status   Technologist Review few targets  Final  Surgical pcr screen     Status: None   Collection Time: 08/07/15  7:46 AM  Result Value Ref Range Status   MRSA, PCR NEGATIVE NEGATIVE Final   Staphylococcus aureus NEGATIVE NEGATIVE Final    Comment:        The Xpert SA Assay (FDA approved for NASAL specimens in patients over 28 years of age), is one component of a comprehensive surveillance program.  Test performance has been validated by [facility] for patients greater than or equal to 2 year old. It is not intended to diagnose infection nor to guide or monitor treatment.          Radiology Studies: No results found.    Scheduled Meds: . antiseptic oral rinse  7 mL Mouth Rinse q12n4p  . aspirin EC  325 mg Oral Q breakfast  . chlorhexidine  15 mL Mouth Rinse BID  . feeding supplement (JEVITY 1.2 CAL)  355 mL Per Tube TID WC & HS  . heparin  5,000 Units Subcutaneous Q8H  . hydrocortisone sod succinate (SOLU-CORTEF) inj  100 mg Intravenous Q8H  . pantoprazole sodium  40 mg Per Tube BID  . sodium chloride flush  10-40 mL Intracatheter Q12H  . sodium chloride flush  3 mL Intravenous Q12H  . sucralfate  1 g Oral Q6H   Continuous Infusions:     LOS: 2 days    Time spent in minutes: 24    Ruth, MD [facility] Pager: [email or web address] Password [facility] 08/09/2015, 2:48 PM

## 2015-08-09 NOTE — Progress Notes (Signed)
Subjective: 2 Days Post-Op Procedure(s) (LRB): INTRAMEDULLARY (IM) NAIL INTERTROCHANTRIC (Left)  Activity level:  Partial WB left leg Diet tolerance:  PEG tube Voiding:  Foley Patient reports pain as moderate.    Objective: Vital signs in last 24 hours: Temp:  [98 F (36.7 C)-98.9 F (37.2 C)] 98.9 F (37.2 C) (07/17 0539) Pulse Rate:  [89-95] 89 (07/17 0539) Resp:  [16-18] 16 (07/17 0539) BP: (116-132)/(52-62) 132/58 mmHg (07/17 0539) SpO2:  [95 %-100 %] 100 % (07/17 0539)  Labs:  Recent Labs  08/06/15 2121 08/07/15 0330 08/08/15 0500 08/09/15 0545  HGB 9.5* 9.7* 8.3* 7.1*    Recent Labs  08/08/15 0500 08/09/15 0545  WBC 9.6 11.4*  RBC 2.77* 2.41*  HCT 26.1* 22.3*  PLT 253 227    Recent Labs  08/08/15 0500 08/09/15 0545  NA 135 137  K 4.7 4.4  CL 101 102  CO2 29 31  BUN 33* 34*  CREATININE 0.85 0.67  GLUCOSE 128* 116*  CALCIUM 8.3* 8.0*    Recent Labs  08/06/15 2121  INR 0.99    Physical Exam:  Neurologically intact ABD soft Neurovascular intact Sensation intact distally Intact pulses distally Dorsiflexion/Plantar flexion intact Incision: dressing C/D/I and scant drainage No cellulitis present Compartment soft  Assessment/Plan:  2 Days Post-Op Procedure(s) (LRB): INTRAMEDULLARY (IM) NAIL INTERTROCHANTRIC (Left) Up with therapy  Continue on ASA '325mg'$  BID x 4 weeks post op for DVT prevention. Continue partial WB left leg. Follow up in office 2 weeks post op. Continue current pain meds as her pain seems to be improving.  We greatly appreciate medical management.  Megan Sims, Megan Sims 08/09/2015, 9:16 AM

## 2015-08-09 NOTE — Progress Notes (Signed)
OT Cancellation Note  Patient Details Name: AZARRIA BALINT MRN: [id number] DOB: 29-Aug-1955   Cancelled Treatment:    Reason Eval/Treat Not Completed: Other (comment) Pt states she is finally comfortable and does not want to move right now.  Will check back on pt later in day or next day.  Kari Baars, [place]  Payton Mccallum D 08/09/2015, 12:56 PM

## 2015-08-09 NOTE — Progress Notes (Signed)
Megan Sims   DOB:June 26, 1955   WJ#:[id number]    Subjective: She is seen in the hospital. She had recent fall and had emergent surgery due to hip fracture She denies pain  Objective:  Filed Vitals:   08/09/15 0124 08/09/15 0539  BP: 116/52 132/58  Pulse: 95 89  Temp: 98.7 F (37.1 C) 98.9 F (37.2 C)  Resp: 16 16     Intake/Output Summary (Last 24 hours) at 08/09/15 0843 Last data filed at 08/09/15 0600  Gross per 24 hour  Intake    475 ml  Output    200 ml  Net    275 ml    GENERAL:alert, no distress and comfortable SKIN: Skin erythema has improved EYES: normal, Conjunctiva are pink and non-injected, sclera clear Musculoskeletal:no cyanosis of digits and no clubbing  NEURO: alert & oriented x 3 with fluent speech, no focal motor/sensory deficits   Labs:  Lab Results  Component Value Date   WBC 11.4* 08/09/2015   HGB 7.1* 08/09/2015   HCT 22.3* 08/09/2015   MCV 92.5 08/09/2015   PLT 227 08/09/2015   NEUTROABS 6.6 08/06/2015    Lab Results  Component Value Date   NA 137 08/09/2015   K 4.4 08/09/2015   CL 102 08/09/2015   CO2 31 08/09/2015    Studies:  Dg C-arm 1-60 Min-no Report  08/07/2015  CLINICAL DATA: left hip fracture C-ARM 1-60 MINUTES Fluoroscopy was utilized by the requesting physician.  No radiographic interpretation.   Dg Hip Operative Unilat With Pelvis Left  08/07/2015  CLINICAL DATA:  IM nail of left leg. EXAM: DG C-ARM 1-60 MIN-NO REPORT; OPERATIVE LEFT HIP WITH PELVIS COMPARISON:  07/27/2015. FINDINGS: Four intraoperative spot fluoro film show placement of a dynamic hip screw with a long femoral intra medullary nail. Single distal interlocking screw evident. Nail crosses a proximal femur fracture with marked improvement in alignment compared to pre reduction films. IMPRESSION: Intraoperative assessment during ORIF for proximal femur fracture. No evidence for immediate hardware complications. Electronically Signed   By: Misty Stanley M.D.   On:  08/07/2015 15:35    Assessment & Plan:  Bilateral lung cancer ([place]) Given those CT scan showed no definitive signs of cancer recurrence, I'm concerned about the complete right middle lobe collapse. I will get a scan reviewed at the next ENT tumor board on Wednesday for further discussion  Recent fall with hip fracture status post ORIF Continue postoperative care  Chronic neck pain The patient is not consistent with the pain medicine. Her pain is currently under control.  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 Overall, I do not believe she Stevens-Johnson syndrome. Rather, I suspect she may have dermatomyositis. In the short-term, I will put her on low-dose prednisone therapy I have requested rheumatology consult as an outpatient  Dysphagia She has chronic dysphagia with possible risk of aspiration. The cause is unknown but that could be related to esophageal stricture. Neuromuscular involvement of dermatomyositis cannot be excluded She is dependent on feeding tube.  S/P gastrostomy ([place]) The feeding tube site looks okay without signs of infection. She will continue nutritional supplement as directed  Discharge planning She will need to be placed on skilled facility after discharge  [facility], [place], MD 08/09/2015  8:43 AM

## 2015-08-10 ENCOUNTER — Encounter (HOSPITAL_COMMUNITY): Payer: Medicaid Other

## 2015-08-10 DIAGNOSIS — D649 Anemia, unspecified: Secondary | ICD-10-CM

## 2015-08-10 DIAGNOSIS — T451X5A Adverse effect of antineoplastic and immunosuppressive drugs, initial encounter: Secondary | ICD-10-CM

## 2015-08-10 DIAGNOSIS — S7292XA Unspecified fracture of left femur, initial encounter for closed fracture: Secondary | ICD-10-CM | POA: Insufficient documentation

## 2015-08-10 DIAGNOSIS — R233 Spontaneous ecchymoses: Secondary | ICD-10-CM

## 2015-08-10 DIAGNOSIS — K922 Gastrointestinal hemorrhage, unspecified: Secondary | ICD-10-CM | POA: Insufficient documentation

## 2015-08-10 DIAGNOSIS — R791 Abnormal coagulation profile: Secondary | ICD-10-CM

## 2015-08-10 DIAGNOSIS — D6481 Anemia due to antineoplastic chemotherapy: Secondary | ICD-10-CM

## 2015-08-10 LAB — TYPE AND SCREEN
ABO/RH(D): O POS
Antibody Screen: NEGATIVE
UNIT DIVISION: 0
Unit division: 0

## 2015-08-10 LAB — CBC
HCT: 26 % — ABNORMAL LOW (ref 36.0–46.0)
HEMATOCRIT: 21 % — AB (ref 36.0–46.0)
Hemoglobin: 8.8 g/dL — ABNORMAL LOW (ref 12.0–15.0)
Hemoglobin: 7.1 g/dL — ABNORMAL LOW (ref 12.0–15.0)
MCH: 29.8 pg (ref 26.0–34.0)
MCH: 30 pg (ref 26.0–34.0)
MCHC: 33.8 g/dL (ref 30.0–36.0)
MCHC: 33.8 g/dL (ref 30.0–36.0)
MCV: 88.1 fL (ref 78.0–100.0)
MCV: 88.6 fL (ref 78.0–100.0)
PLATELETS: 206 10*3/uL (ref 150–400)
PLATELETS: 166 10*3/uL (ref 150–400)
RBC: 2.95 MIL/uL — ABNORMAL LOW (ref 3.87–5.11)
RBC: 2.37 MIL/uL — ABNORMAL LOW (ref 3.87–5.11)
RDW: 19.2 % — AB (ref 11.5–15.5)
RDW: 19.2 % — AB (ref 11.5–15.5)
WBC: 14.7 10*3/uL — ABNORMAL HIGH (ref 4.0–10.5)
WBC: 13.4 10*3/uL — AB (ref 4.0–10.5)

## 2015-08-10 LAB — BASIC METABOLIC PANEL
Anion gap: 7 (ref 5–15)
BUN: 31 mg/dL — AB (ref 6–20)
CALCIUM: 8 mg/dL — AB (ref 8.9–10.3)
CO2: 31 mmol/L (ref 22–32)
CREATININE: 0.62 mg/dL (ref 0.44–1.00)
Chloride: 99 mmol/L — ABNORMAL LOW (ref 101–111)
GFR calc Af Amer: >60 mL/min (ref >60)
GLUCOSE: 128 mg/dL — AB (ref 65–99)
Potassium: 4 mmol/L (ref 3.5–5.1)
Sodium: 137 mmol/L (ref 135–145)

## 2015-08-10 LAB — PROTIME-INR
INR: 1.07 (ref 0.00–1.49)
Prothrombin Time: 14.1 seconds (ref 11.6–15.2)

## 2015-08-10 LAB — PREPARE RBC (CROSSMATCH)

## 2015-08-10 LAB — APTT: APTT: 59 s — AB (ref 24–37)

## 2015-08-10 MED ORDER — ASPIRIN 325 MG PO TABS: 325.0000 mg | ORAL_TABLET | Freq: Every day | ORAL | Status: DC

## 2015-08-10 MED ORDER — HYDROCORTISONE NA SUCCINATE PF 100 MG IJ SOLR
100.0000 mg | Freq: Three times a day (TID) | INTRAMUSCULAR | Status: DC
  Administered 2015-08-10 – 2015-08-13 (×9): 100 mg via INTRAVENOUS
  Filled 2015-08-10 (×9): qty 2

## 2015-08-10 MED ORDER — SODIUM CHLORIDE 0.9 % IV SOLN
Freq: Once | INTRAVENOUS | Status: AC
  Administered 2015-08-10: 14:00:00 via INTRAVENOUS

## 2015-08-10 MED ORDER — PREDNISONE 5 MG/5ML PO SOLN
60.0000 mg | Freq: Every day | ORAL | Status: DC
  Administered 2015-08-10: 60 mg
  Filled 2015-08-10: qty 60

## 2015-08-10 MED ORDER — SODIUM CHLORIDE 0.9 % IV SOLN: Freq: Once | INTRAVENOUS | Status: DC

## 2015-08-10 NOTE — Progress Notes (Signed)
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 Found to have a fracture of left femur. Is s/p left hip intertroch nail on 7/15.   Subjective: Called by RN for oozing from heparin sites and wounds. Patient has pain in left leg but no other complaints.   Assessment & Plan:   Principal Problem:   Left femoral shaft fracture - wound oozing - ortho called to eval wound - per ortho- partial weight bearing - stop aspirin and heparin today - SNF referral  Active Problems:  Bleeding - constant oozing from heparin sites and from hip wounds starting overnight  - coags show PTT elevated- mixing study ordered - oncology consulted- will re-call Dr Alen Blew now to see if she needs FFP - stop heparin and Aspirin    Anemia of chronic disease with acute blood loss   - transfused 2 u PRBC yesterday - 2 more U PRBC now    Essential hypertension - Norvasc on hold    Bilateral lung cancer  - management per Heme/onc- her case was to be discussed this week at ENT tumor board this week    Paget's disease of bone - asymptomatic with normal alk phos - continue to follow   Dermatomyositis - this is the cause of her fall and fracture - on Prednisone which was recently started- dose was increased to 60 mg daily - she has taken 2 days at this dose - stress dose steroids for now - PT eval>>  will need SNF  Dysphagia - cont PEG feeds/ free water- tolerating well  PUD - PPI and Sucralfate- avoid NSAIDs  Chronic neck pain - Hycet PRN- she takes it anywhere from 2- 4 x day as needed   DVT prophylaxis: per  ortho Code Status: full code Family Communication:  Disposition Plan:  Consultants:   ortho Procedures:   Intertroch nail- left hip  Antimicrobials:  Anti-infectives    Start     Dose/Rate Route Frequency Ordered Stop   08/07/15 2000  ceFAZolin (ANCEF) IVPB 2g/100 mL premix     2 g 200 mL/hr over 30 Minutes Intravenous Every 6 hours 08/07/15 1608 08/07/15 2134       Objective: Filed Vitals:   08/10/15 1339 08/10/15 1415 08/10/15 1440 08/10/15 1705  BP: 127/64 135/64 125/62 146/79  Pulse: 92 93 92 94  Temp: 98.1 F (36.7 C) 98.2 F (36.8 C) 98.4 F (36.9 C) 98.5 F (36.9 C)  TempSrc: Oral Oral Oral Oral  Resp: 16 16 16 18   Height:      Weight:      SpO2: 98% 98% 100% 100%    Intake/Output Summary (Last 24 hours) at 08/10/15 1729 Last data filed at 08/10/15 1707  Gross per 24 hour  Intake   1235 ml  Output    700 ml  Net    535 ml   Filed Weights   08/07/15 0243  Weight: 53.978 kg (119 lb)    Examination: General exam: Appears comfortable  HEENT: PERRLA, oral mucosa moist, no sclera icterus or thrush Respiratory system: Clear to auscultation. Respiratory effort normal. Cardiovascular system: S1 & S2 heard, RRR.  No murmurs  Gastrointestinal system: Abdomen soft, non-tender, nondistended. Normal bowel sound. No organomegaly Central nervous system: Alert and oriented. No focal neurological deficits. Extremities: No cyanosis, clubbing or edema- has swelling of left thigh Skin: erythema and swelling in various areas of the body - bleeding under dressings and from heparin sites  Psychiatry:  Mood & affect appropriate.     Data Reviewed: I have personally reviewed following labs and imaging studies  CBC:  Recent Labs Lab 08/06/15 2121 08/07/15 0330 08/08/15 0500 08/09/15 0545 08/10/15 0405 08/10/15 1150  WBC 8.6 12.4* 9.6 11.4* 14.7* 13.4*  NEUTROABS 6.6  --   --   --   --   --   HGB 9.5* 9.7* 8.3* 7.1* 8.8* 7.1*  HCT 29.2* 30.3* 26.1* 22.3* 26.0*  21.0*  MCV 92.4 93.5 94.2 92.5 88.1 88.6  PLT 321 321 253 227 206 034   Basic Metabolic Panel:  Recent Labs Lab 08/06/15 2121 08/07/15 0924 08/08/15 0500 08/09/15 0545 08/10/15 0405  NA 136 137 135 137 137  K 4.3 4.2 4.7 4.4 4.0  CL 101 103 101 102 99*  CO2 26 29 29 31 31   GLUCOSE 179* 100* 128* 116* 128*  BUN 34* 32* 33* 34* 31*  CREATININE 0.83 0.75 0.85 0.67 0.62  CALCIUM 8.9 8.7* 8.3* 8.0* 8.0*   GFR: Estimated Creatinine Clearance: 56.4 mL/min (by C-G formula based on Cr of 0.62). Liver Function Tests:  Recent Labs Lab 08/07/15 0924  AST 54*  ALT 21  ALKPHOS 68  BILITOT 0.4  PROT 6.0*  ALBUMIN 3.0*   No results for input(s): LIPASE, AMYLASE in the last 168 hours. No results for input(s): AMMONIA in the last 168 hours. Coagulation Profile:  Recent Labs Lab 08/06/15 2121 08/10/15 1150  INR 0.99 1.07   Cardiac Enzymes: No results for input(s): CKTOTAL, CKMB, CKMBINDEX, TROPONINI in the last 168 hours. BNP (last 3 results) No results for input(s): PROBNP in the last 8760 hours. HbA1C: No results for input(s): HGBA1C in the last 72 hours. CBG: No results for input(s): GLUCAP in the last 168 hours. Lipid Profile: No results for input(s): CHOL, HDL, LDLCALC, TRIG, CHOLHDL, LDLDIRECT in the last 72 hours. Thyroid Function Tests: No results for input(s): TSH, T4TOTAL, FREET4, T3FREE, THYROIDAB in the last 72 hours. Anemia Panel: No results for input(s): VITAMINB12, FOLATE, FERRITIN, TIBC, IRON, RETICCTPCT in the last 72 hours. Urine analysis:    Component Value Date/Time   COLORURINE YELLOW 05/24/2015 1930   APPEARANCEUR CLEAR 05/24/2015 1930   LABSPEC >1.030* 05/24/2015 1930   PHURINE 5.0 05/24/2015 1930   GLUCOSEU NEGATIVE 05/24/2015 1930   HGBUR NEGATIVE 05/24/2015 1930   BILIRUBINUR NEGATIVE 05/24/2015 1930   KETONESUR NEGATIVE 05/24/2015 1930   PROTEINUR TRACE* 05/24/2015 1930   UROBILINOGEN 0.2 10/25/2014 0021   NITRITE NEGATIVE 05/24/2015  1930   LEUKOCYTESUR TRACE* 05/24/2015 1930   Sepsis Labs: @LABRCNTIP (procalcitonin:4,lacticidven:4) ) Recent Results (from the past 240 hour(s))  TECHNOLOGIST REVIEW     Status: None   Collection Time: 08/02/15 10:17 AM  Result Value Ref Range Status   Technologist Review few targets  Final  Surgical pcr screen     Status: None   Collection Time: 08/07/15  7:46 AM  Result Value Ref Range Status   MRSA, PCR NEGATIVE NEGATIVE Final   Staphylococcus aureus NEGATIVE NEGATIVE Final    Comment:        The Xpert SA Assay (FDA approved for NASAL specimens in patients over 86 years of age),  is one component of a comprehensive surveillance program.  Test performance has been validated by [facility] for patients greater than or equal to 34 year old. It is not intended to diagnose infection nor to guide or monitor treatment.          Radiology Studies: No results found.    Scheduled Meds: . antiseptic oral rinse  7 mL Mouth Rinse q12n4p  . chlorhexidine  15 mL Mouth Rinse BID  . feeding supplement (JEVITY 1.2 CAL)  355 mL Per Tube TID WC & HS  . hydrocortisone sod succinate (SOLU-CORTEF) inj  100 mg Intravenous Q8H  . pantoprazole sodium  40 mg Per Tube BID  . sodium chloride flush  10-40 mL Intracatheter Q12H  . sodium chloride flush  3 mL Intravenous Q12H  . sucralfate  1 g Oral Q6H   Continuous Infusions:     LOS: 3 days    Time spent in minutes: 69    [place], MD [facility] Pager: [email or web address] Password [facility] 08/10/2015, 5:29 PM

## 2015-08-10 NOTE — Progress Notes (Signed)
Patient's left hip dressing was changed.  Dressing was pooled with blood.  Upon assessment of incision, both incisions are approximated with staples; however, there is a steady drip of blood coming from both incisions.  Multiple ABD pads applied to left hip. Will continue to monitor for bleeding and changed dressing as needed.

## 2015-08-10 NOTE — Clinical Social Work Placement (Signed)
Patient has a bed at [facility]. CSW has completed FL2 & will continue to follow and assist with discharge when ready.    Raynaldo Opitz, [facility] Clinical Social Worker cell #: [phone]     CLINICAL SOCIAL WORK PLACEMENT  NOTE  Date:  08/10/2015  Patient Details  Name: Megan Sims MRN: [id number] Date of Birth: [birth year]  Clinical Social Work is seeking post-discharge placement for this patient at the [place] level of care (*CSW will initial, date and re-position this form in  chart as items are completed):  Yes   Patient/family provided with [place] Work Department's list of facilities offering this level of care within the geographic area requested by the patient (or if unable, by the patient's family).  Yes   Patient/family informed of their freedom to choose among providers that offer the needed level of care, that participate in Medicare, Medicaid or managed care program needed by the patient, have an available bed and are willing to accept the patient.  Yes   Patient/family informed of [facility]'s ownership interest in [facility] and [facility], as well as of the fact that they are under no obligation to receive care at these facilities.  PASRR submitted to EDS on 08/07/15     PASRR number received on 08/07/15     Existing PASRR number confirmed on       FL2 transmitted to all facilities in geographic area requested by pt/family on 08/07/15     FL2 transmitted to all facilities within larger geographic area on       Patient informed that his/her managed care company has contracts with or will negotiate with certain facilities, including the following:        Yes   Patient/family informed of bed offers received.  Patient chooses bed at [facility]     Physician recommends and patient chooses bed at      Patient to be transferred to [facility] on  .  Patient to be transferred to facility by       Patient family notified on   of transfer.  Name of family member notified:        PHYSICIAN Please prepare priority discharge summary, including medications, Please prepare prescriptions, Please sign FL2     Additional Comment:    _______________________________________________ Standley Brooking, LCSW 08/10/2015, 9:57 AM

## 2015-08-10 NOTE — Progress Notes (Signed)
Megan Sims   DOB:Oct 26, 1955   YK#:[id number]    Identifying statement: 60 year old woman with stage IIIB lung cancer diagnosed in September 2016. She is status post therapy utilizing carboplatin and Taxol concomitantly with radiation therapy and 2016. Patient presented with weakness and sustained a fall on 08/07/2015. She is status post left subtrochanteric femur fracture and fixation surgically done on 08/07/2015.  Patient tolerated the procedure well and have been recovering reasonably well. Patient seen by Dr. Alvy Bimler on 08/09/2015. I was asked to evaluate the patient today after she started developing oozing from heparin injection sites as well as from her surgical incision. Her hemoglobin on 08/09/2015 was 7.1 and on 08/10/2015 was 8.1 and later in the day at 11:50 am dropped to 7.1. Her platelet count is 166 which had dropped from 227 in the last 24 hours. Her PTT is elevated at 59 with a normal PT and INR. Her PTT was normal in March 2017.   Subjective: She reports feeling reasonably fair. She has not moved her bowels but does report increased gas. She does not report any other bleeding symptoms. She denied any hemoptysis or hematemesis. She denied any mucosal bleeding. She does report thigh pain and swelling since her surgery. She noted bruising on injection site in her lower abdomen as well as on the lateral aspect of her left thigh.      Objective:  Filed Vitals:   08/10/15 1125 08/10/15 1339  BP: 127/67 127/64  Pulse: 98 92  Temp: 97.9 F (36.6 C) 98.1 F (36.7 C)  Resp: 18 16     Intake/Output Summary (Last 24 hours) at 08/10/15 1413 Last data filed at 08/10/15 1300  Gross per 24 hour  Intake    900 ml  Output    500 ml  Net    400 ml    GENERAL:alert, Awake woman appeared without distress. SKIN: Oozing noted from injection sites at the lower abdomen. EYES: normal, Conjunctiva are pink and non-injected, sclera clear Oromucosa: Showed no blood blisters or thrush. Heart:  Regular rate and rhythm without murmurs rubs or gallops. Lungs: Clear to auscultation without wheezes or rhonchi. Musculoskeletal: Tense left thigh tender to touch. Dressing on the lateral side of her thigh appeared to be blood soaked. NEURO: alert & oriented x 3 with fluent speech, no focal motor/sensory deficits   Labs:  Lab Results  Component Value Date   WBC 13.4* 08/10/2015   HGB 7.1* 08/10/2015   HCT 21.0* 08/10/2015   MCV 88.6 08/10/2015   PLT 166 08/10/2015   NEUTROABS 6.6 08/06/2015    Lab Results  Component Value Date   NA 137 08/10/2015   K 4.0 08/10/2015   CL 99* 08/10/2015   CO2 31 08/10/2015   Current Facility-Administered Medications  Medication Dose Route Frequency Provider Last Rate Last Dose  . acetaminophen (TYLENOL) tablet 650 mg  650 mg Oral Q6H PRN Loni Dolly, PA-C       Or  . acetaminophen (TYLENOL) suppository 650 mg  650 mg Rectal Q6H PRN Loni Dolly, PA-C      . antiseptic oral rinse (CPC / CETYLPYRIDINIUM CHLORIDE 0.05%) solution 7 mL  7 mL Mouth Rinse q12n4p Reubin Milan, MD   7 mL at 08/10/15 1200  . benzonatate (TESSALON) capsule 100 mg  100 mg Oral TID PRN Debbe Odea, MD      . chlorhexidine (PERIDEX) 0.12 % solution 15 mL  15 mL Mouth Rinse BID Reubin Milan, MD   15 mL  at 08/10/15 1046  . feeding supplement (JEVITY 1.2 CAL) liquid 355 mL  355 mL Per Tube TID WC & HS Debbe Odea, MD   355 mL at 08/10/15 1207  . HYDROcodone-acetaminophen (NORCO/VICODIN) 5-325 MG per tablet 1-2 tablet  1-2 tablet Oral Q6H PRN Loni Dolly, PA-C   2 tablet at 08/09/15 0840  . hydrocortisone sodium succinate (SOLU-CORTEF) 100 MG injection 100 mg  100 mg Intravenous Q8H Saima Rizwan, MD   100 mg at 08/10/15 1409  . HYDROmorphone (DILAUDID) injection 1 mg  1 mg Intravenous Q3H PRN Reubin Milan, MD   1 mg at 08/10/15 1207  . menthol-cetylpyridinium (CEPACOL) lozenge 3 mg  1 lozenge Oral PRN Loni Dolly, PA-C       Or  . phenol (CHLORASEPTIC) mouth  spray 1 spray  1 spray Mouth/Throat PRN Loni Dolly, PA-C      . methocarbamol (ROBAXIN) tablet 500 mg  500 mg Oral Q6H PRN Loni Dolly, PA-C   500 mg at 08/08/15 2153   Or  . methocarbamol (ROBAXIN) 500 mg in dextrose 5 % 50 mL IVPB  500 mg Intravenous Q6H PRN Loni Dolly, PA-C   500 mg at 08/07/15 1939  . metoCLOPramide (REGLAN) tablet 5-10 mg  5-10 mg Oral Q8H PRN Loni Dolly, PA-C       Or  . metoCLOPramide (REGLAN) injection 5-10 mg  5-10 mg Intravenous Q8H PRN Loni Dolly, PA-C      . ondansetron [facility]) tablet 4 mg  4 mg Oral Q6H PRN Loni Dolly, PA-C       Or  . ondansetron [facility]) injection 4 mg  4 mg Intravenous Q6H PRN Loni Dolly, PA-C      . pantoprazole sodium (PROTONIX) 40 mg/20 mL oral suspension 40 mg  40 mg Per Tube BID Debbe Odea, MD   40 mg at 08/10/15 1046  . sodium chloride flush (NS) 0.9 % injection 10-40 mL  10-40 mL Intracatheter Q12H Debbe Odea, MD   10 mL at 08/10/15 0405  . sodium chloride flush (NS) 0.9 % injection 10-40 mL  10-40 mL Intracatheter PRN Debbe Odea, MD   10 mL at 08/08/15 0501  . sodium chloride flush (NS) 0.9 % injection 3 mL  3 mL Intravenous Q12H Reubin Milan, MD   3 mL at 08/08/15 2220  . sucralfate (CARAFATE) 1 GM/10ML suspension 1 g  1 g Oral Q6H Debbe Odea, MD   1 g at 08/10/15 1207     Assessment & Plan: 60 year old woman with the following issues:  1. Cutaneous bleeding and possible coagulopathy: This was noted that postoperatively after starting subcutaneous heparin injections. She was noted to have significant amount of bleeding coming out of the wound dressing as well as slow bleeding from the abdominal site where she received subcutaneous heparin.  Her PTT is elevated indicating possible heparin effect versus an inhibitor which could be contributing to her slow bleeding process. I agree with stopping heparin at this time.  For management standpoint, I have recommended continue supportive management as you're doing. Mixing  studies are currently pending to determine whether we're dealing with heparin effect versus an inhibitor. I will also obtain an inhibitor titers for further characterization.    2. Recent fall with hip fracture status post ORIF. I would recommend orthopedic evaluation to rule out any active bleeding around the wound site.  3. Anemia: Her hemoglobin have been fluctuating but certainly agree with the transfusion at this time given the possibility of active  bleeding.   [facility], MD 08/10/2015  2:13 PM

## 2015-08-10 NOTE — Progress Notes (Signed)
Subjective: 3 Days Post-Op Procedure(s) (LRB): INTRAMEDULLARY (IM) NAIL INTERTROCHANTRIC (Left)    Patient is having new bleeding from surgical sites today. The nurse appropriately changed and reinforced the dressing. Heparin likely cause of bleeding from surgical sites.   Activity level:  Partial WB left leg Diet tolerance:  PEG Voiding:  Foley Patient reports pain as moderate.    Objective: Vital signs in last 24 hours: Temp:  [97.7 F (36.5 C)-98.8 F (37.1 C)] 98.4 F (36.9 C) (07/18 1440) Pulse Rate:  [92-110] 92 (07/18 1440) Resp:  [16-18] 16 (07/18 1440) BP: (125-162)/(56-81) 125/62 mmHg (07/18 1440) SpO2:  [92 %-100 %] 100 % (07/18 1440)  Labs:  Recent Labs  08/08/15 0500 08/09/15 0545 08/10/15 0405 08/10/15 1150  HGB 8.3* 7.1* 8.8* 7.1*    Recent Labs  08/10/15 0405 08/10/15 1150  WBC 14.7* 13.4*  RBC 2.95* 2.37*  HCT 26.0* 21.0*  PLT 206 166    Recent Labs  08/09/15 0545 08/10/15 0405  NA 137 137  K 4.4 4.0  CL 102 99*  CO2 31 31  BUN 34* 31*  CREATININE 0.67 0.62  GLUCOSE 116* 128*  CALCIUM 8.0* 8.0*    Recent Labs  08/10/15 1150  INR 1.07    Physical Exam:  Neurologically intact ABD soft Neurovascular intact Sensation intact distally Intact pulses distally Dorsiflexion/Plantar flexion intact Incision: dressing C/D/I and moderate drainage No cellulitis present Compartment soft  Assessment/Plan:  3 Days Post-Op Procedure(s) (LRB): INTRAMEDULLARY (IM) NAIL INTERTROCHANTRIC (Left) Up with therapy  Continue on ASA '325mg'$  BID x 4 weeks post op for DVT prevention. Continue partial WB left leg. Follow up in office 2 weeks post op. Okay to reinforce and change dressing as needed. Hopefully stopping the heparin will stop surgical site bleeding. We greatly appreciate medical management.   Labrenda Lasky, [place] Sachs 08/10/2015, 3:42 PM

## 2015-08-10 NOTE — Progress Notes (Signed)
OT Cancellation Note  Patient Details Name: Megan Sims MRN: [id number] DOB: [birth year]   Cancelled Treatment:    Reason Eval/Treat Not Completed: Medical issues which prohibited therapy.  Pt is bleeding from incision at this time.  Will hold OT today.  Rashan Rounsaville 08/10/2015, 3:00 PM  Lesle Chris, OTR/L [phone] 08/10/2015

## 2015-08-10 NOTE — Progress Notes (Signed)
Per report at shift change, patient has been having bleeding from heparin injection sites and patient told RN that "this started last night."  Med was given prior to shift change and 2x2 stacked gauze dressing was saturated.  Changed dressing and noted that patient had slow but steady bleed coming from injection site.  Pharmacist made aware.  Will inform MD.  Will continue to monitor.

## 2015-08-10 NOTE — Progress Notes (Addendum)
PT Cancellation Note  Patient Details Name: Megan Sims MRN: [id number] DOB: Jan 19, 1956   Cancelled Treatment:    Reason Eval/Treat Not Completed: Medical issues which prohibited therapy. Nursing requested to hold PT at this time due to bleeding.  Will check back as schedule permits.  ADDENDUM: Spoke with nurse after lunch and pt still with bleeding at incision site.  Will check back tomorrow.  Mery Guadalupe LUBECK 08/10/2015, 11:06 AM

## 2015-08-10 NOTE — Progress Notes (Signed)
NT notified RN of significant amount of bleeding coming from patient's left hip dressing.  RN assessed patient and noted a moderate amount of blood on bed pad and hip dressing 100% saturated with a large amount of blood pooled in the dressing.  Upon assessment of patient at 0830, RN noted that left large hip dressing was 50% saturated with old drainage, no active bleeding was noted on either dressings on left leg.  However, patient did have an active slow bleed from abdominal site where patient received SQ Heparin injections prior to shift change.  Abdominal 2X2 dressing was changed at 0830 and was saturated again at 1030. VS were obtained. VS stable with the exception of HR elevated at 110. Pharmacist and MD were notified.  New orders received.  Will continue to monitor bleeding at this time.

## 2015-08-11 DIAGNOSIS — S72302A Unspecified fracture of shaft of left femur, initial encounter for closed fracture: Secondary | ICD-10-CM

## 2015-08-11 LAB — PT FACTOR INHIBITOR (MIXING STUDY)
PT 11NP: 10.3 s (ref 9.6–11.5)
PT: 10.4 s (ref 9.6–11.5)

## 2015-08-11 LAB — BASIC METABOLIC PANEL
ANION GAP: 6 (ref 5–15)
BUN: 29 mg/dL — AB (ref 6–20)
CALCIUM: 7.8 mg/dL — AB (ref 8.9–10.3)
CO2: 32 mmol/L (ref 22–32)
Chloride: 100 mmol/L — ABNORMAL LOW (ref 101–111)
Creatinine, Ser: 0.59 mg/dL (ref 0.44–1.00)
GFR calc Af Amer: >60 mL/min (ref >60)
Glucose, Bld: 137 mg/dL — ABNORMAL HIGH (ref 65–99)
POTASSIUM: 3.7 mmol/L (ref 3.5–5.1)
SODIUM: 138 mmol/L (ref 135–145)

## 2015-08-11 LAB — PREPARE FRESH FROZEN PLASMA
UNIT DIVISION: 0
Unit division: 0

## 2015-08-11 LAB — CBC
HCT: 21 % — ABNORMAL LOW (ref 36.0–46.0)
Hemoglobin: 7.4 g/dL — ABNORMAL LOW (ref 12.0–15.0)
MCH: 29.7 pg (ref 26.0–34.0)
MCHC: 35.2 g/dL (ref 30.0–36.0)
MCV: 84.3 fL (ref 78.0–100.0)
PLATELETS: 110 10*3/uL — AB (ref 150–400)
RBC: 2.49 MIL/uL — AB (ref 3.87–5.11)
RDW: 17.5 % — ABNORMAL HIGH (ref 11.5–15.5)
WBC: 10.2 10*3/uL (ref 4.0–10.5)

## 2015-08-11 NOTE — Progress Notes (Signed)
Nutrition Follow-up  INTERVENTION:   Recommend SLP consult  TF recommendation: Please consult RD for Tube feeding management Provide 2 cans of Jevity 1.2 every 4 hours. (0800, 1200, 1600) Per pt request, provide 1 can, then 1 hour later provide second can.  Recommend 60 ml before and after each bolus feeds. Tube feeding regimen provides 1710 kcal, 79 g protein and 1506 ml H2O.  RD to continue to monitor  NUTRITION DIAGNOSIS:   Increased nutrient needs related to cancer and cancer related treatments as evidenced by estimated needs.  Ongoing.  GOAL:   Patient will meet greater than or equal to 90% of their needs  Meeting.  MONITOR:   Labs, Weight trends, TF tolerance, I & O's  ASSESSMENT:   60 y.o. female with lung cancer and Paget's among other issues. Fell yesterday at home and now has extreme pain and a broken femur. Has been wheelchair dependent for a couple of weeks  Patient reports no issues with tube feeds at this time, tolerating well. Per chart review, 7/17 pt is followed by outpatient SLP which recommends a SLP consult while pt is hospitalized. Pt inquiring about PO diet and when she can eat PO, currently NPO. Wt is stable.   Labs reviewed. Medications reviewed.  Diet Order:  Diet NPO time specified  Skin:  Wound (see comment) (Hip incision from 7/15)  Last BM:  7/18  Height:   Ht Readings from Last 1 Encounters:  08/07/15 '5\' 1"'$  (1.549 m)    Weight:   Wt Readings from Last 1 Encounters:  08/07/15 119 lb (53.978 kg)    Ideal Body Weight:  47.7 kg  BMI:  Body mass index is 22.5 kg/(m^2).  Estimated Nutritional Needs:   Kcal:  1600-1800  Protein:  70-80g  Fluid:  1.8L/day  EDUCATION NEEDS:   No education needs identified at this time  Clayton Bibles, MS, RD, LDN Pager: [phone] After Hours Pager: [phone]

## 2015-08-11 NOTE — Progress Notes (Signed)
There is a progress note to transfuse 2 units of PRBCs but there is no order to transfuse 2 units of PRBCs. The hemoglobin is 7.4. PCP on call was notified.

## 2015-08-11 NOTE — Progress Notes (Signed)
Megan Sims   DOB:1955/05/03   PJ#:[id number]     Subjective: No major changes in her health noted. She tolerated pack red cell transfusion without any issues. She still have slight oozing on her left thigh on the incision sites. No other complaints noted.    Objective:  Filed Vitals:   08/11/15 0325 08/11/15 0538  BP: 149/68 151/73  Pulse: 98 89  Temp: 97.8 F (36.6 C) 98.4 F (36.9 C)  Resp: 16 16     Intake/Output Summary (Last 24 hours) at 08/11/15 1204 Last data filed at 08/11/15 0325  Gross per 24 hour  Intake   1370 ml  Output    300 ml  Net   1070 ml    GENERAL:alert, Awake woman appeared without distress. SKIN: Oozing noted from injection sites at the lower abdomen. EYES: normal, Conjunctiva are pink and non-injected, sclera clear Oromucosa: Showed no blood blisters or thrush. Heart: Regular rate and rhythm without murmurs rubs or gallops. Lungs: Clear to auscultation without wheezes or rhonchi. Musculoskeletal: left thigh tender to touch. Dressing on the lateral side of her thigh appear dry with some blood noted externally. NEURO: alert & oriented x 3 with fluent speech, no focal motor/sensory deficits   Labs:  Lab Results  Component Value Date   WBC 10.2 08/11/2015   HGB 7.4* 08/11/2015   HCT 21.0* 08/11/2015   MCV 84.3 08/11/2015   PLT 110* 08/11/2015   NEUTROABS 6.6 08/06/2015    Lab Results  Component Value Date   NA 138 08/11/2015   K 3.7 08/11/2015   CL 100* 08/11/2015   CO2 32 08/11/2015   Current Facility-Administered Medications  Medication Dose Route Frequency Provider Last Rate Last Dose  . 0.9 %  sodium chloride infusion   Intravenous Once Debbe Odea, MD      . acetaminophen (TYLENOL) tablet 650 mg  650 mg Oral Q6H PRN Loni Dolly, PA-C       Or  . acetaminophen (TYLENOL) suppository 650 mg  650 mg Rectal Q6H PRN Loni Dolly, PA-C      . antiseptic oral rinse (CPC / CETYLPYRIDINIUM CHLORIDE 0.05%) solution 7 mL  7 mL Mouth Rinse q12n4p  Reubin Milan, MD   7 mL at 08/10/15 1600  . benzonatate (TESSALON) capsule 100 mg  100 mg Oral TID PRN Debbe Odea, MD      . chlorhexidine (PERIDEX) 0.12 % solution 15 mL  15 mL Mouth Rinse BID Reubin Milan, MD   15 mL at 08/10/15 2005  . feeding supplement (JEVITY 1.2 CAL) liquid 355 mL  355 mL Per Tube TID WC & HS Debbe Odea, MD   355 mL at 08/11/15 0952  . HYDROcodone-acetaminophen (NORCO/VICODIN) 5-325 MG per tablet 1-2 tablet  1-2 tablet Oral Q6H PRN Loni Dolly, PA-C   2 tablet at 08/09/15 0840  . hydrocortisone sodium succinate (SOLU-CORTEF) 100 MG injection 100 mg  100 mg Intravenous Q8H Debbe Odea, MD   100 mg at 08/11/15 0536  . HYDROmorphone (DILAUDID) injection 1 mg  1 mg Intravenous Q3H PRN Reubin Milan, MD   1 mg at 08/11/15 0914  . menthol-cetylpyridinium (CEPACOL) lozenge 3 mg  1 lozenge Oral PRN Loni Dolly, PA-C       Or  . phenol (CHLORASEPTIC) mouth spray 1 spray  1 spray Mouth/Throat PRN Loni Dolly, PA-C      . methocarbamol (ROBAXIN) tablet 500 mg  500 mg Oral Q6H PRN Loni Dolly, PA-C   500  mg at 08/08/15 2153   Or  . methocarbamol (ROBAXIN) 500 mg in dextrose 5 % 50 mL IVPB  500 mg Intravenous Q6H PRN Loni Dolly, PA-C   500 mg at 08/07/15 1939  . metoCLOPramide (REGLAN) tablet 5-10 mg  5-10 mg Oral Q8H PRN Loni Dolly, PA-C       Or  . metoCLOPramide (REGLAN) injection 5-10 mg  5-10 mg Intravenous Q8H PRN Loni Dolly, PA-C      . ondansetron [facility]) tablet 4 mg  4 mg Oral Q6H PRN Loni Dolly, PA-C       Or  . ondansetron [facility]) injection 4 mg  4 mg Intravenous Q6H PRN Loni Dolly, PA-C      . pantoprazole sodium (PROTONIX) 40 mg/20 mL oral suspension 40 mg  40 mg Per Tube BID Debbe Odea, MD   40 mg at 08/11/15 0952  . sodium chloride flush (NS) 0.9 % injection 10-40 mL  10-40 mL Intracatheter Q12H Debbe Odea, MD   10 mL at 08/10/15 0405  . sodium chloride flush (NS) 0.9 % injection 10-40 mL  10-40 mL Intracatheter PRN Debbe Odea, MD   10  mL at 08/08/15 0501  . sodium chloride flush (NS) 0.9 % injection 3 mL  3 mL Intravenous Q12H Reubin Milan, MD   3 mL at 08/10/15 2200  . sucralfate (CARAFATE) 1 GM/10ML suspension 1 g  1 g Oral Q6H Debbe Odea, MD   1 g at 08/11/15 0536   Results for [name], [name] (MRN [id number]) as of 08/11/2015 12:03  Ref. Range 08/10/2015 12:55  PT 1:1NP Latest Ref Range: 9.6-11.5 sec 10.3    Assessment & Plan: 60 year old woman with the following issues:  1. Cutaneous bleeding and possible coagulopathy: This was noted that postoperatively after starting subcutaneous heparin injections. She was noted to have significant amount of bleeding coming out of the wound dressing as well as slow bleeding from the abdominal site where she received subcutaneous heparin.  Her PTT is elevated indicating possible heparin effect versus an inhibitor which could be contributing to her slow bleeding process. I agree with stopping heparin at this time.  Her bleeding appears to be minimal at this time and likely related to heparin effect. Her PTT mixing studies as well as factor VIII inhibitor titers are pending.  I have recommended continue supportive measures as you are doing.   2. Recent fall with hip fracture status post ORIF. No evidence of intra-compartmental bleed. Her compartments appeared soft and followed by orthopedics.  3. Anemia: Her hemoglobin improved slightly with transfusion. I recommend continued follow her hemoglobin closely.   [facility], MD 08/11/2015  12:04 PM

## 2015-08-11 NOTE — Progress Notes (Signed)
OT Cancellation Note  Patient Details Name: SRUTI AYLLON MRN: [id number] DOB: [birth year]   Cancelled Treatment:    Reason Eval/Treat Not Completed: Other (comment).  Noted pt has declined PT twice (once within the hour). Will check back tomorrow as schedule permits.  Gerold Sar 08/11/2015, 3:50 PM  Lesle Chris, OTR/L [phone] 08/11/2015

## 2015-08-11 NOTE — Progress Notes (Signed)
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 Found to have a fracture of left femur. Is s/p left hip intertroch nail on 7/15.   Subjective: Nursing reports the patient is oozing less from wounds.  Assessment & Plan:   Principal Problem:   Left femoral shaft fracture - wound oozing but reportedly less - ortho called to eval wound - per ortho- partial weight bearing - stop aspirin and heparin - SNF referral  Active Problems:  Bleeding - constant oozing from heparin sites and from hip wounds starting overnight  - coags show PTT elevated- mixing study ordered - oncology consulted- will re-call Dr Alen Blew now to see if she needs FFP - stop heparin and Aspirin    Anemia of chronic disease with acute blood loss   - transfused 2 u PRBC yesterday - 2 more U PRBC now    Essential hypertension - Norvasc on hold    Bilateral lung cancer  - management per Heme/onc- her case was to be discussed this week at ENT tumor board this week    Paget's disease of bone - asymptomatic with normal alk phos - continue to follow   Dermatomyositis - this is the cause of her fall and fracture - on Prednisone which was recently started- dose was increased to 60 mg daily - she has taken 3 days at this dose - stress dose steroids for now - PT eval>>  will need SNF  Dysphagia - cont PEG feeds/ free water- tolerating well  PUD - PPI and Sucralfate- avoid NSAIDs  Chronic neck pain - Hycet PRN- she takes it anywhere from 2- 4 x day as needed   DVT prophylaxis: per ortho Code Status: full code Family  Communication:  Disposition Plan:  Consultants:   ortho Procedures:   Intertroch nail- left hip  Antimicrobials:  Anti-infectives    Start     Dose/Rate Route Frequency Ordered Stop   08/07/15 2000  ceFAZolin (ANCEF) IVPB 2g/100 mL premix     2 g 200 mL/hr over 30 Minutes Intravenous Every 6 hours 08/07/15 1608 08/07/15 2134       Objective: Filed Vitals:   08/11/15 0105 08/11/15 0325 08/11/15 0538 08/11/15 1502  BP: 141/67 149/68 151/73 115/57  Pulse: 92 98 89 98  Temp: 98.5 F (36.9 C) 97.8 F (36.6 C) 98.4 F (36.9 C) 98.6 F (37 C)  TempSrc: Oral Oral Oral Oral  Resp: 16 16 16 20   Height:      Weight:      SpO2: 98% 99% 98% 97%    Intake/Output Summary (Last 24 hours) at 08/11/15 1556 Last data filed at 08/11/15 0952  Gross per 24 hour  Intake   1310 ml  Output    375 ml  Net    935 ml   Filed Weights   08/07/15 0243  Weight: 53.978 kg (119 lb)    Examination: General exam: Awake and alert, in NAD HEENT: PERRLA, oral mucosa moist, no sclera icterus or thrush Respiratory system: Clear to auscultation. Respiratory effort normal. Cardiovascular system: S1 & S2 heard, RRR.  No murmurs  Gastrointestinal system: Abdomen soft, non-tender,  nondistended. Normal bowel sound. No organomegaly Central nervous system: Alert and oriented. No focal neurological deficits. Extremities: No cyanosis, clubbing or edema- has swelling of left thigh Skin: erythema and swelling in various areas of the body  Psychiatry:  Mood & affect appropriate.     Data Reviewed: I have personally reviewed following labs and imaging studies  CBC:  Recent Labs Lab 08/06/15 2121  08/08/15 0500 08/09/15 0545 08/10/15 0405 08/10/15 1150 08/11/15 0530  WBC 8.6  < > 9.6 11.4* 14.7* 13.4* 10.2  NEUTROABS 6.6  --   --   --   --   --   --   HGB 9.5*  < > 8.3* 7.1* 8.8* 7.1* 7.4*  HCT 29.2*  < > 26.1* 22.3* 26.0* 21.0* 21.0*  MCV 92.4  < > 94.2 92.5 88.1 88.6 84.3  PLT 321  < > 253  227 206 166 110*  < > = values in this interval not displayed. Basic Metabolic Panel:  Recent Labs Lab 08/07/15 0924 08/08/15 0500 08/09/15 0545 08/10/15 0405 08/11/15 0530  NA 137 135 137 137 138  K 4.2 4.7 4.4 4.0 3.7  CL 103 101 102 99* 100*  CO2 29 29 31 31  32  GLUCOSE 100* 128* 116* 128* 137*  BUN 32* 33* 34* 31* 29*  CREATININE 0.75 0.85 0.67 0.62 0.59  CALCIUM 8.7* 8.3* 8.0* 8.0* 7.8*   GFR: Estimated Creatinine Clearance: 56.4 mL/min (by C-G formula based on Cr of 0.59). Liver Function Tests:  Recent Labs Lab 08/07/15 0924  AST 54*  ALT 21  ALKPHOS 68  BILITOT 0.4  PROT 6.0*  ALBUMIN 3.0*   No results for input(s): LIPASE, AMYLASE in the last 168 hours. No results for input(s): AMMONIA in the last 168 hours. Coagulation Profile:  Recent Labs Lab 08/06/15 2121 08/10/15 1150  INR 0.99 1.07   Cardiac Enzymes: No results for input(s): CKTOTAL, CKMB, CKMBINDEX, TROPONINI in the last 168 hours. BNP (last 3 results) No results for input(s): PROBNP in the last 8760 hours. HbA1C: No results for input(s): HGBA1C in the last 72 hours. CBG: No results for input(s): GLUCAP in the last 168 hours. Lipid Profile: No results for input(s): CHOL, HDL, LDLCALC, TRIG, CHOLHDL, LDLDIRECT in the last 72 hours. Thyroid Function Tests: No results for input(s): TSH, T4TOTAL, FREET4, T3FREE, THYROIDAB in the last 72 hours. Anemia Panel: No results for input(s): VITAMINB12, FOLATE, FERRITIN, TIBC, IRON, RETICCTPCT in the last 72 hours. Urine analysis:    Component Value Date/Time   COLORURINE YELLOW 05/24/2015 1930   APPEARANCEUR CLEAR 05/24/2015 1930   LABSPEC >1.030* 05/24/2015 1930   PHURINE 5.0 05/24/2015 1930   GLUCOSEU NEGATIVE 05/24/2015 1930   HGBUR NEGATIVE 05/24/2015 1930   BILIRUBINUR NEGATIVE 05/24/2015 1930   KETONESUR NEGATIVE 05/24/2015 1930   PROTEINUR TRACE* 05/24/2015 1930   UROBILINOGEN 0.2 10/25/2014 0021   NITRITE NEGATIVE 05/24/2015 1930    LEUKOCYTESUR TRACE* 05/24/2015 1930   Sepsis Labs: @LABRCNTIP (procalcitonin:4,lacticidven:4) ) Recent Results (from the past 240 hour(s))  TECHNOLOGIST REVIEW     Status: None   Collection Time: 08/02/15 10:17 AM  Result Value Ref Range Status   Technologist Review few targets  Final  Surgical pcr screen     Status: None   Collection Time: 08/07/15  7:46 AM  Result Value Ref Range Status   MRSA, PCR NEGATIVE NEGATIVE Final   Staphylococcus aureus NEGATIVE NEGATIVE Final    Comment:        The Xpert SA Assay (FDA  approved for NASAL specimens in patients over 40 years of age), is one component of a comprehensive surveillance program.  Test performance has been validated by [facility] for patients greater than or equal to 21 year old. It is not intended to diagnose infection nor to guide or monitor treatment.          Radiology Studies: No results found.    Scheduled Meds: . sodium chloride   Intravenous Once  . antiseptic oral rinse  7 mL Mouth Rinse q12n4p  . chlorhexidine  15 mL Mouth Rinse BID  . feeding supplement (JEVITY 1.2 CAL)  355 mL Per Tube TID WC & HS  . hydrocortisone sod succinate (SOLU-CORTEF) inj  100 mg Intravenous Q8H  . pantoprazole sodium  40 mg Per Tube BID  . sodium chloride flush  10-40 mL Intracatheter Q12H  . sodium chloride flush  3 mL Intravenous Q12H  . sucralfate  1 g Oral Q6H   Continuous Infusions:     LOS: 4 days    Time spent in minutes: 35    Velvet Bathe, MD [facility] Pager: [email or web address] Password [facility] 08/11/2015, 3:56 PM

## 2015-08-11 NOTE — Progress Notes (Signed)
Subjective: 4 Days Post-Op Procedure(s) (LRB): INTRAMEDULLARY (IM) NAIL INTERTROCHANTRIC (Left)  Activity level:  Partial WB left leg. Diet tolerance:  PEG Voiding:Foley  Patient reports pain as mild and moderate.    Objective: Vital signs in last 24 hours: Temp:  [97.8 F (36.6 C)-98.9 F (37.2 C)] 98.4 F (36.9 C) (07/19 0538) Pulse Rate:  [89-110] 89 (07/19 0538) Resp:  [16-18] 16 (07/19 0538) BP: (125-164)/(60-79) 151/73 mmHg (07/19 0538) SpO2:  [96 %-100 %] 98 % (07/19 0538)  Labs:  Recent Labs  08/09/15 0545 08/10/15 0405 08/10/15 1150 08/11/15 0530  HGB 7.1* 8.8* 7.1* 7.4*    Recent Labs  08/10/15 1150 08/11/15 0530  WBC 13.4* 10.2  RBC 2.37* 2.49*  HCT 21.0* 21.0*  PLT 166 110*    Recent Labs  08/10/15 0405 08/11/15 0530  NA 137 138  K 4.0 3.7  CL 99* 100*  CO2 31 32  BUN 31* 29*  CREATININE 0.62 0.59  GLUCOSE 128* 137*  CALCIUM 8.0* 7.8*    Recent Labs  08/10/15 1150 08/10/15 1255  LABPT  --  10.4  INR 1.07  --     Physical Exam:  Neurologically intact ABD soft Neurovascular intact Sensation intact distally Intact pulses distally Dorsiflexion/Plantar flexion intact Incision: dressing C/D/I and moderate drainage No cellulitis present Compartment soft  Assessment/Plan:  4 Days Post-Op Procedure(s) (LRB): INTRAMEDULLARY (IM) NAIL INTERTROCHANTRIC (Left) Up with therapy  Continue on ASA '325mg'$  BID x 4 weeks post op for DVT prevention. Continue partial WB left leg. Follow up in office 2 weeks post op. I changed the bandage this morning. Okay to reinforce and change dressing as needed. Hopefully stopping the heparin will stop surgical site bleeding. We greatly appreciate medical management    Derik Fults, Larwance Sachs 08/11/2015, 8:23 AM

## 2015-08-11 NOTE — Progress Notes (Signed)
PT Cancellation Note  Patient Details Name: Megan Sims MRN: [id number] DOB: 1955-02-27   Cancelled Treatment:    Reason Eval/Treat Not Completed: Attempted tx session-pt declined to participate. Will check back if schedule allows.    Weston Anna, MPT Pager: [phone]

## 2015-08-11 NOTE — Progress Notes (Signed)
PT Cancellation Note  Patient Details Name: Megan Sims MRN: [id number] DOB: 01/28/1955   Cancelled Treatment:    Reason Eval/Treat Not Completed: 2nd attempt for PT tx session. Pt declined to participate. Will check back another day.   Weston Anna, MPT Pager: [phone]

## 2015-08-12 DIAGNOSIS — G893 Neoplasm related pain (acute) (chronic): Secondary | ICD-10-CM

## 2015-08-12 DIAGNOSIS — R58 Hemorrhage, not elsewhere classified: Secondary | ICD-10-CM

## 2015-08-12 LAB — MISC LABCORP TEST (SEND OUT)

## 2015-08-12 LAB — APTT: aPTT: 26 seconds (ref 24–37)

## 2015-08-12 LAB — CBC WITH DIFFERENTIAL/PLATELET
BASOS ABS: 0 10*3/uL (ref 0.0–0.1)
Basophils Relative: 0 %
EOS ABS: 0 10*3/uL (ref 0.0–0.7)
Eosinophils Relative: 0 %
HCT: 27.7 % — ABNORMAL LOW (ref 36.0–46.0)
Hemoglobin: 9.7 g/dL — ABNORMAL LOW (ref 12.0–15.0)
LYMPHS PCT: 15 %
Lymphs Abs: 2 10*3/uL (ref 0.7–4.0)
MCH: 31.2 pg (ref 26.0–34.0)
MCHC: 35 g/dL (ref 30.0–36.0)
MCV: 89.1 fL (ref 78.0–100.0)
MONO ABS: 2 10*3/uL — AB (ref 0.1–1.0)
MONOS PCT: 15 %
NEUTROS PCT: 70 %
Neutro Abs: 9.4 10*3/uL — ABNORMAL HIGH (ref 1.7–7.7)
Platelets: 104 10*3/uL — ABNORMAL LOW (ref 150–400)
RBC: 3.11 MIL/uL — AB (ref 3.87–5.11)
RDW: 16.2 % — AB (ref 11.5–15.5)
WBC: 13.4 10*3/uL — ABNORMAL HIGH (ref 4.0–10.5)
nRBC: 16 /100 WBC — ABNORMAL HIGH

## 2015-08-12 LAB — PREPARE RBC (CROSSMATCH)

## 2015-08-12 LAB — CBC
HCT: 29.7 % — ABNORMAL LOW (ref 36.0–46.0)
HEMOGLOBIN: 10.6 g/dL — AB (ref 12.0–15.0)
MCH: 31.3 pg (ref 26.0–34.0)
MCHC: 35.7 g/dL (ref 30.0–36.0)
MCV: 87.6 fL (ref 78.0–100.0)
Platelets: 103 10*3/uL — ABNORMAL LOW (ref 150–400)
RBC: 3.39 MIL/uL — AB (ref 3.87–5.11)
RDW: 16.1 % — ABNORMAL HIGH (ref 11.5–15.5)
WBC: 13.3 10*3/uL — ABNORMAL HIGH (ref 4.0–10.5)

## 2015-08-12 LAB — PROTIME-INR
INR: 1.06 (ref 0.00–1.49)
PROTHROMBIN TIME: 14 s (ref 11.6–15.2)

## 2015-08-12 LAB — HEMOGLOBIN AND HEMATOCRIT, BLOOD
HEMATOCRIT: 15.3 % — AB (ref 36.0–46.0)
Hemoglobin: 5.3 g/dL — CL (ref 12.0–15.0)

## 2015-08-12 LAB — FIBRINOGEN: Fibrinogen: 212 mg/dL (ref 204–475)

## 2015-08-12 MED ORDER — FUROSEMIDE 10 MG/ML IJ SOLN
20.0000 mg | Freq: Once | INTRAMUSCULAR | Status: AC
  Administered 2015-08-12: 20 mg via INTRAVENOUS
  Filled 2015-08-12: qty 2

## 2015-08-12 MED ORDER — PROCHLORPERAZINE EDISYLATE 5 MG/ML IJ SOLN: 10.0000 mg | Freq: Once | INTRAMUSCULAR | Status: DC

## 2015-08-12 MED ORDER — SODIUM CHLORIDE 0.9 % IV SOLN
Freq: Once | INTRAVENOUS | Status: AC
  Administered 2015-08-12: 08:00:00 via INTRAVENOUS

## 2015-08-12 NOTE — Progress Notes (Signed)
Subjective: 5 Days Post-Op Procedure(s) (LRB): INTRAMEDULLARY (IM) NAIL INTERTROCHANTRIC (Left)  Activity level: Partial WB left leg. Diet tolerance: PEG Voiding:Foley Patient reports pain as mild and moderate.    Objective: Vital signs in last 24 hours: Temp:  [97.7 F (36.5 C)-98.6 F (37 C)] 97.9 F (36.6 C) (07/20 1414) Pulse Rate:  [83-98] 91 (07/20 1414) Resp:  [14-20] 20 (07/20 1414) BP: (113-180)/(49-75) 157/75 mmHg (07/20 1414) SpO2:  [97 %-100 %] 98 % (07/20 1414)  Labs:  Recent Labs  08/10/15 0405 08/10/15 1150 08/11/15 0530 08/11/15 2015 08/12/15 0830  HGB 8.8* 7.1* 7.4* 5.3* 9.7*    Recent Labs  08/11/15 0530 08/11/15 2015 08/12/15 0830  WBC 10.2  --  13.4*  RBC 2.49*  --  3.11*  HCT 21.0* 15.3* 27.7*  PLT 110*  --  104*    Recent Labs  08/10/15 0405 08/11/15 0530  NA 137 138  K 4.0 3.7  CL 99* 100*  CO2 31 32  BUN 31* 29*  CREATININE 0.62 0.59  GLUCOSE 128* 137*  CALCIUM 8.0* 7.8*    Recent Labs  08/10/15 1150 08/10/15 1255 08/12/15 0830  LABPT  --  10.4  --   INR 1.07  --  1.06    Physical Exam:  Neurologically intact ABD soft Neurovascular intact Sensation intact distally Intact pulses distally Dorsiflexion/Plantar flexion intact Incision: dressing C/D/I and no drainage No cellulitis present Compartment soft  Assessment/Plan:  5 Days Post-Op Procedure(s) (LRB): INTRAMEDULLARY (IM) NAIL INTERTROCHANTRIC (Left) Continue partial WB left leg. Follow up in office 2 weeks post op. Okay to reinforce and change dressing as needed. It appears that the bleeding is slowing down and or stopped. The bandage has not been changed since last night. We greatly appreciate medical management. From an orthopaedic stand point she is good to go for d/c once bleeding stays well controlled.  Evelio Rueda, Larwance Sachs 08/12/2015, 2:33 PM

## 2015-08-12 NOTE — Progress Notes (Signed)
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 Found to have a fracture of left femur. Is s/p left hip intertroch nail on 7/15.   Subjective: Nursing reports the patient is oozing less from wounds.  Assessment & Plan:   Principal Problem:   Left femoral shaft fracture - wound oozing but reportedly less - ortho on board - per ortho- partial weight bearing - stop aspirin and heparin - SNF referral  Active Problems:  Bleeding - constant oozing from heparin sites and from hip wounds starting overnight  - coags show PTT elevated- mixing study ordered - oncology assisting. Suspects buildup of heparin in her system. Currently off heparin.  - stop heparin and Aspirin    Anemia of chronic disease with acute blood loss   - transfused 2 u PRBC  Overnight.  - Will assess cbc q 8 hours if hgb less than 8.0 transfuse.     Essential hypertension - Norvasc on hold    Bilateral lung cancer  - management per Heme/onc- her case was to be discussed this week at ENT tumor board this week    Paget's disease of bone - asymptomatic with normal alk phos - continue to follow  Dermatomyositis - this is the cause of her fall and fracture - on Prednisone which was recently started- dose was increased to 60 mg daily - she has taken 3 days at this dose - stress dose steroids for now - PT eval>>  will need SNF  Dysphagia - cont PEG feeds/ free water- tolerating well  PUD - PPI and Sucralfate- avoid NSAIDs  Chronic neck pain - Hycet PRN- she takes it anywhere from 2- 4 x day as needed   DVT prophylaxis:  per ortho Code Status: full code Family Communication:  Disposition Plan:  Consultants:   ortho Procedures:   Intertroch nail- left hip  Antimicrobials:  Anti-infectives    Start     Dose/Rate Route Frequency Ordered Stop   08/07/15 2000  ceFAZolin (ANCEF) IVPB 2g/100 mL premix     2 g 200 mL/hr over 30 Minutes Intravenous Every 6 hours 08/07/15 1608 08/07/15 2134       Objective: Filed Vitals:   08/12/15 1010 08/12/15 1036 08/12/15 1309 08/12/15 1414  BP: 138/66 144/65 180/70 157/75  Pulse: 85 87 98 91  Temp: 98.4 F (36.9 C) 97.7 F (36.5 C) 98.2 F (36.8 C) 97.9 F (36.6 C)  TempSrc: Oral Oral Oral Oral  Resp: 16 16 20 20  Height:      Weight:      SpO2: 98% 98% 100% 98%    Intake/Output Summary (Last 24 hours) at 08/12/15 1735 Last data filed at 08/12/15 1716  Gross per 24 hour  Intake   2088 ml  Output    550 ml  Net   1538 ml   Filed Weights   08/07/15 0243  Weight: 53.978 kg (119 lb)    Examination: General exam: Awake and alert, in NAD HEENT: PERRLA, oral mucosa moist, no sclera icterus or thrush Respiratory system: Clear to auscultation. Respiratory effort normal. Cardiovascular system: S1 & S2 heard, RRR.  No murmurs    Gastrointestinal system: Abdomen soft, non-tender, nondistended. Normal bowel sound. No organomegaly Central nervous system: Alert and oriented. No focal neurological deficits. Extremities: No cyanosis, clubbing or edema- has swelling of left thigh Skin: erythema and swelling in various areas of the body  Psychiatry:  Mood & affect appropriate.     Data Reviewed: I have personally reviewed following labs and imaging studies  CBC:  Recent Labs Lab 08/06/15 2121  08/09/15 0545 08/10/15 0405 08/10/15 1150 08/11/15 0530 08/11/15 2015 08/12/15 0830  WBC 8.6  < > 11.4* 14.7* 13.4* 10.2  --  13.4*  NEUTROABS 6.6  --   --   --   --   --   --  9.4*  HGB 9.5*  < > 7.1* 8.8* 7.1* 7.4* 5.3* 9.7*  HCT 29.2*  < > 22.3* 26.0*  21.0* 21.0* 15.3* 27.7*  MCV 92.4  < > 92.5 88.1 88.6 84.3  --  89.1  PLT 321  < > 227 206 166 110*  --  104*  < > = values in this interval not displayed. Basic Metabolic Panel:  Recent Labs Lab 08/07/15 0924 08/08/15 0500 08/09/15 0545 08/10/15 0405 08/11/15 0530  NA 137 135 137 137 138  K 4.2 4.7 4.4 4.0 3.7  CL 103 101 102 99* 100*  CO2 _0 32  GLUCOSE 100* 128* 116* 128* 137*  BUN 32* 33* 34* 31* 29*  CREATININE 0.75 0.85 0.67 0.62 0.59  CALCIUM 8.7* 8.3* 8.0* 8.0* 7.8*   GFR: Estimated Creatinine Clearance: 56.4 mL/min (by C-G formula based on Cr of 0.59). Liver Function Tests:  Recent Labs Lab 08/07/15 0924  AST 54*  ALT 21  ALKPHOS 68  BILITOT 0.4  PROT 6.0*  ALBUMIN 3.0*   No results for input(s): LIPASE, AMYLASE in the last 168 hours. No results for input(s): AMMONIA in the last 168 hours. Coagulation Profile:  Recent Labs Lab 08/06/15 2121 08/10/15 1150 08/12/15 0830  INR 0.99 1.07 1.06   Cardiac Enzymes: No results for input(s): CKTOTAL, CKMB, CKMBINDEX, TROPONINI in the last 168 hours. BNP (last 3 results) No results for input(s): PROBNP in the last 8760 hours. HbA1C: No results for input(s): HGBA1C in the last 72 hours. CBG: No results for input(s): GLUCAP in the last 168 hours. Lipid Profile: No results for input(s): CHOL, HDL, LDLCALC, TRIG, CHOLHDL, LDLDIRECT in the last 72 hours. Thyroid Function Tests: No results for input(s): TSH, T4TOTAL, FREET4, T3FREE, THYROIDAB in the last 72 hours. Anemia Panel: No results for input(s): VITAMINB12, FOLATE, FERRITIN, TIBC, IRON, RETICCTPCT in the last 72 hours. Urine analysis:    Component Value Date/Time   COLORURINE YELLOW 05/24/2015 1930   APPEARANCEUR CLEAR 05/24/2015 1930   LABSPEC >1.030* 05/24/2015 1930   PHURINE 5.0 05/24/2015 1930   GLUCOSEU NEGATIVE 05/24/2015 1930   HGBUR NEGATIVE 05/24/2015 1930   BILIRUBINUR NEGATIVE 05/24/2015 1930   KETONESUR NEGATIVE 05/24/2015 1930    PROTEINUR TRACE* 05/24/2015 1930   UROBILINOGEN 0.2 10/25/2014 0021   NITRITE NEGATIVE 05/24/2015 1930   LEUKOCYTESUR TRACE* 05/24/2015 1930   Sepsis Labs: _1 (procalcitonin:4,lacticidven:4) ) Recent Results (from the past 240 hour(s))  Surgical pcr screen     Status: None   Collection Time: 08/07/15  7:46 AM  Result Value Ref Range Status   MRSA, PCR NEGATIVE NEGATIVE Final   Staphylococcus aureus NEGATIVE NEGATIVE Final    Comment:        The Xpert SA Assay (FDA approved for NASAL specimens in patients over 21  years of age), is one component of a comprehensive surveillance program.  Test performance has been validated by [facility] for patients greater than or equal to 1 year old. It is not intended to diagnose infection nor to guide or monitor treatment.          Radiology Studies: No results found.    Scheduled Meds: . sodium chloride   Intravenous Once  . antiseptic oral rinse  7 mL Mouth Rinse q12n4p  . chlorhexidine  15 mL Mouth Rinse BID  . feeding supplement (JEVITY 1.2 CAL)  355 mL Per Tube TID WC & HS  . hydrocortisone sod succinate (SOLU-CORTEF) inj  100 mg Intravenous Q8H  . pantoprazole sodium  40 mg Per Tube BID  . sodium chloride flush  10-40 mL Intracatheter Q12H  . sodium chloride flush  3 mL Intravenous Q12H  . sucralfate  1 g Oral Q6H   Continuous Infusions:     LOS: 5 days    Time spent in minutes: 35   , , MD [facility] Pager: [email or web address] Password [email or web address] 08/12/2015, 5:35 PM    

## 2015-08-12 NOTE — Progress Notes (Signed)
Occupational Therapy Treatment Patient Details Name: Megan Sims MRN: [id number] DOB: 06-28-1955 Today's Date: 08/12/2015    History of present illness 60 yo female admitted with Left femoral shaft fx. S/P L hip trochanteric nail 08/07/15.  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    OT comments  Pt tolerated eob today; increased assistance from evaluation. She has had several medical cancellations recently. Motivated to participate today  Follow Up Recommendations  SNF    Equipment Recommendations  None recommended by OT    Recommendations for Other Services      Precautions / Restrictions Precautions Precautions: Fall Restrictions LLE Weight Bearing: Partial weight bearing       Mobility Bed Mobility     Rolling: Mod assist   Supine to sit: Max assist;+2 for physical assistance        Transfers                      Balance                                   ADL Overall ADL's : Needs assistance/impaired     Grooming: Set up;Wash/dry hands;Sitting (unsupported)                       Toileting- Clothing Manipulation and Hygiene: Total assistance;Bed level         General ADL Comments: pt rolled to bil sides to use bedpan and for repositioning.  Able to sit eob x 20 minutes with min guard for safey and assist to reposition for comfort.  Pt felt dizzy and has not sat up for several days.  Unable to assist with laterally scooting in bed.  She is not interested in AE as she has assistance for adls at baseline.  She is also not interested in theraband for strengthening at this time, but states that she has some at home.      Vision                     Perception     Praxis      Cognition   Behavior During Therapy: WFL for tasks assessed/performed Overall Cognitive Status: Within Functional Limits for tasks assessed                        Extremity/Trunk Assessment               Exercises     Shoulder Instructions       General Comments      Pertinent Vitals/ Pain       Pain Assessment: 0-10 Pain Score: 9  Pain Location: L hip Pain Descriptors / Indicators: Aching Pain Intervention(s): Limited activity within patient's tolerance;Monitored during session;Repositioned;Ice applied;Patient requesting pain meds-RN notified (did not want to be premedicated)  Home Living                                          Prior Functioning/Environment              Frequency Min 2X/week     Progress Toward Goals  OT Goals(current goals can now be found in the care plan section)  Progress towards OT  goals: Progressing toward goals (slowly, several medical cancellations recently)  Acute Rehab OT Goals Time For Goal Achievement: 08/22/15 Potential to Achieve Goals: Good  Plan      Co-evaluation                 End of Session     Activity Tolerance Patient tolerated treatment well   Patient Left in bed;with call bell/phone within reach;with bed alarm set   Nurse Communication          Time: 7867-5449 OT Time Calculation (min): 42 min  Charges: OT General Charges $OT Visit: 1 Procedure OT Treatments $Self Care/Home Management : 8-22 mins $Therapeutic Activity: 23-37 mins  Zebulan Hinshaw 08/12/2015, 1:52 PM Lesle Chris, OTR/L [phone] 08/12/2015

## 2015-08-12 NOTE — Progress Notes (Signed)
Patient's Hgb up to 9 after 2units of PRBC, informed Dr. Wendee Beavers and asked him about giving the 3rd unit of blood and he stated to give all 3units as ordered.

## 2015-08-12 NOTE — Progress Notes (Signed)
Megan Sims   DOB:Jun 29, 1955   YW#:[id number]    Subjective: The patient feels well. She denies dizziness, chest pain or shortness of breath. She was receiving blood transfusion when I walked into the room. The night shift nursing staff reported the need to change her dressing at the postoperative site twice last night due to active bleeding. The patient denies any recent signs or symptoms of bleeding such as spontaneous epistaxis, hematuria or hematochezia.   Objective:  Filed Vitals:   08/12/15 0526 08/12/15 0658  BP: 147/58 148/72  Pulse: 88 98  Temp: 98.1 F (36.7 C) 98.2 F (36.8 C)  Resp: 14 18     Intake/Output Summary (Last 24 hours) at 08/12/15 4854 Last data filed at 08/12/15 6270  Gross per 24 hour  Intake    768 ml  Output    175 ml  Net    593 ml    GENERAL:alert, no distress and comfortable SKIN: The skin rash on her face has improved EYES: normal, Conjunctiva are pink and non-injected, sclera clear OROPHARYNX:no exudate, no erythema and lips, buccal mucosa, and tongue normal  NECK: supple, thyroid normal size, non-tender, without nodularity LYMPH:  no palpable lymphadenopathy in the cervical, axillary or inguinal LUNGS: clear to auscultation and percussion with normal breathing effort HEART: regular rate & rhythm and no murmurs and no lower extremity edema ABDOMEN:abdomen soft, non-tender and normal bowel sounds. Feeding tube site looks okay Musculoskeletal:no cyanosis of digits and no clubbing . There is no active oozing at the postop site over her left hip NEURO: alert & oriented x 3 with fluent speech, no focal motor/sensory deficits   Labs:  Lab Results  Component Value Date   WBC 13.4* 08/12/2015   HGB 9.7* 08/12/2015   HCT 27.7* 08/12/2015   MCV 89.1 08/12/2015   PLT 104* 08/12/2015   NEUTROABS 9.4* 08/12/2015    Lab Results  Component Value Date   NA 138 08/11/2015   K 3.7 08/11/2015   CL 100* 08/11/2015   CO2 32 08/11/2015    Assessment &  Plan:   Bilateral lung cancer ([place]) Given those CT scan showed no definitive signs of cancer recurrence, I'm concerned about the complete right middle lobe collapse. I discussed her case yesterday at the ENT tumor board and we did not see any signs to suggest definitive cancer recurrence. I will repeat serial imaging study with CT scan of the chest in 3 months  Recent fall with hip fracture status post ORIF Continue postoperative care  Recent bleeding Elevated APTT, resolved She had recent active bleeding. She had elevated APTT but it resolved and corrected with mixing study. This is not compatible with inhibitor. It could be related to accumulation of heparin effect, reduced clotting factor production due to synthetic liver dysfunction or consumption. Heparin subcutaneous injection has been stopped. I have ordered coagulation study today which appeared within normal limits. I recommend close monitoring of incision site and transfuse as needed to keep hemoglobin greater than 8  Chronic neck pain Her pain is currently under control.  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 Overall, I do not believe she Stevens-Johnson syndrome. Rather, I suspect she may have dermatomyositis. In the short-term, I will put her on low-dose prednisone therapy I have requested rheumatology consult as an outpatient  Dysphagia She has chronic dysphagia with possible risk of aspiration. The cause is unknown but that could be related to esophageal stricture. Neuromuscular involvement  of dermatomyositis cannot be excluded She is dependent on feeding tube.  S/P gastrostomy ([place]) The feeding tube site looks okay without signs of infection. She will continue nutritional supplement as directed  Discharge planning She will need to be placed on skilled facility after discharge  [facility], [place], MD 08/12/2015  9:27 AM

## 2015-08-12 NOTE — Progress Notes (Signed)
CRITICAL VALUE ALERT  Critical value received:  Hemoglobin 5.3  Date of notification:  08/11/15  Time of notification:  0039  Critical value read back:yes  Nurse who received alert: Dellie Catholic  MD notified (1st page):  yes  Time of first page:  0040  :

## 2015-08-13 ENCOUNTER — Other Ambulatory Visit: Payer: Self-pay | Admitting: Hematology and Oncology

## 2015-08-13 DIAGNOSIS — D62 Acute posthemorrhagic anemia: Secondary | ICD-10-CM

## 2015-08-13 LAB — CBC WITH DIFFERENTIAL/PLATELET
BASOS ABS: 0 10*3/uL (ref 0.0–0.1)
BASOS PCT: 0 %
EOS PCT: 0 %
Eosinophils Absolute: 0 10*3/uL (ref 0.0–0.7)
HEMATOCRIT: 30.4 % — AB (ref 36.0–46.0)
Hemoglobin: 10.6 g/dL — ABNORMAL LOW (ref 12.0–15.0)
LYMPHS ABS: 1 10*3/uL (ref 0.7–4.0)
LYMPHS PCT: 8 %
MCH: 30.8 pg (ref 26.0–34.0)
MCHC: 34.9 g/dL (ref 30.0–36.0)
MCV: 88.4 fL (ref 78.0–100.0)
Monocytes Absolute: 2.3 10*3/uL — ABNORMAL HIGH (ref 0.1–1.0)
Monocytes Relative: 18 %
NEUTROS PCT: 74 %
Neutro Abs: 9.5 10*3/uL — ABNORMAL HIGH (ref 1.7–7.7)
PLATELETS: 118 10*3/uL — AB (ref 150–400)
RBC: 3.44 MIL/uL — AB (ref 3.87–5.11)
RDW: 16.4 % — AB (ref 11.5–15.5)
WBC: 12.8 10*3/uL — AB (ref 4.0–10.5)
nRBC: 22 /100 WBC — ABNORMAL HIGH

## 2015-08-13 LAB — TYPE AND SCREEN
ABO/RH(D): O POS
Antibody Screen: NEGATIVE
UNIT DIVISION: 0
UNIT DIVISION: 0
Unit division: 0
Unit division: 0
Unit division: 0

## 2015-08-13 LAB — CBC
HCT: 31.4 % — ABNORMAL LOW (ref 36.0–46.0)
HEMOGLOBIN: 10.7 g/dL — AB (ref 12.0–15.0)
MCH: 30.3 pg (ref 26.0–34.0)
MCHC: 34.1 g/dL (ref 30.0–36.0)
MCV: 89 fL (ref 78.0–100.0)
PLATELETS: 123 10*3/uL — AB (ref 150–400)
RBC: 3.53 MIL/uL — AB (ref 3.87–5.11)
RDW: 16.5 % — ABNORMAL HIGH (ref 11.5–15.5)
WBC: 13.7 10*3/uL — AB (ref 4.0–10.5)

## 2015-08-13 LAB — APTT: aPTT: 25 seconds (ref 24–37)

## 2015-08-13 MED ORDER — HEPARIN SOD (PORK) LOCK FLUSH 100 UNIT/ML IV SOLN
500.0000 [IU] | INTRAVENOUS | Status: AC | PRN
  Administered 2015-08-13: 500 [IU]

## 2015-08-13 MED ORDER — PREDNISONE 20 MG PO TABS
20.0000 mg | ORAL_TABLET | Freq: Every day | ORAL | Status: DC
  Administered 2015-08-13: 20 mg via ORAL
  Filled 2015-08-13: qty 1

## 2015-08-13 MED ORDER — PREDNISONE 20 MG PO TABS: 20.0000 mg | ORAL_TABLET | Freq: Every day | ORAL | Status: DC

## 2015-08-13 MED ORDER — HYDROCODONE-ACETAMINOPHEN 5-325 MG PO TABS: 1.0000 | ORAL_TABLET | Freq: Four times a day (QID) | ORAL | Status: DC | PRN

## 2015-08-13 MED ORDER — SODIUM CHLORIDE 0.9% FLUSH: 10.0000 mL | INTRAVENOUS | Status: DC | PRN

## 2015-08-13 NOTE — Progress Notes (Signed)
Subjective: 6 Days Post-Op Procedure(s) (LRB): INTRAMEDULLARY (IM) NAIL INTERTROCHANTRIC (Left)  Activity level: Partial WB left leg. Diet tolerance: PEG Voiding:Foley Patient reports pain as mild and moderate.   Objective: Vital signs in last 24 hours: Temp:  [97.9 F (36.6 C)-98.4 F (36.9 C)] 98.4 F (36.9 C) (07/21 1233) Pulse Rate:  [86-98] 98 (07/21 1233) Resp:  [20] 20 (07/21 1233) BP: (157-188)/(52-86) 188/74 mmHg (07/21 1233) SpO2:  [98 %-100 %] 98 % (07/21 1233)  Labs:  Recent Labs  08/11/15 2015 08/12/15 0830 08/12/15 2153 08/13/15 0355 08/13/15 1050  HGB 5.3* 9.7* 10.6* 10.6* 10.7*    Recent Labs  08/13/15 0355 08/13/15 1050  WBC 12.8* 13.7*  RBC 3.44* 3.53*  HCT 30.4* 31.4*  PLT 118* 123*    Recent Labs  08/11/15 0530  NA 138  K 3.7  CL 100*  CO2 32  BUN 29*  CREATININE 0.59  GLUCOSE 137*  CALCIUM 7.8*    Recent Labs  08/12/15 0830  INR 1.06    Physical Exam:  Neurologically intact ABD soft Neurovascular intact Sensation intact distally Intact pulses distally Dorsiflexion/Plantar flexion intact Incision: dressing C/D/I and no drainage No cellulitis present Compartment soft  Assessment/Plan:  6 Days Post-Op Procedure(s) (LRB): INTRAMEDULLARY (IM) NAIL INTERTROCHANTRIC (Left) Continue partial WB left leg. Follow up in office 2 weeks post op. Okay to reinforce and change dressing as needed. It appears that the bleeding has stopped. We greatly appreciate medical management. From an orthopaedic stand point she is good to go for d/c to SNF.  Chao Blazejewski, Larwance Sachs 08/13/2015, 2:08 PM

## 2015-08-13 NOTE — Clinical Social Work Placement (Signed)
Patient is set to discharge to [facility] today. Patient & patient's son, [name] made aware. Discharge packet given to RN, Marolyn Hammock. [facility] called for transport.     Raynaldo Opitz, [facility] Clinical Social Worker cell #: [phone]    CLINICAL SOCIAL WORK PLACEMENT  NOTE  Date:  08/13/2015  Patient Details  Name: Megan Sims MRN: [id number] Date of Birth: 07-Jun-1955  Clinical Social Work is seeking post-discharge placement for this patient at the [place] level of care (*CSW will initial, date and re-position this form in  chart as items are completed):  Yes   Patient/family provided with [place] Work Department's list of facilities offering this level of care within the geographic area requested by the patient (or if unable, by the patient's family).  Yes   Patient/family informed of their freedom to choose among providers that offer the needed level of care, that participate in Medicare, Medicaid or managed care program needed by the patient, have an available bed and are willing to accept the patient.  Yes   Patient/family informed of [facility]'s ownership interest in [facility] and [facility], as well as of the fact that they are under no obligation to receive care at these facilities.  PASRR submitted to EDS on 08/07/15     PASRR number received on 08/07/15     Existing PASRR number confirmed on       FL2 transmitted to all facilities in geographic area requested by pt/family on 08/07/15     FL2 transmitted to all facilities within larger geographic area on       Patient informed that his/her managed care company has contracts with or will negotiate with certain facilities, including the following:        Yes   Patient/family informed of bed offers received.  Patient chooses bed at [facility]     Physician recommends and patient chooses bed at      Patient to  be transferred to [facility] on 08/13/15.  Patient to be transferred to facility by [facility]     Patient family notified on 08/13/15 of transfer.  Name of family member notified:  patient's son, [name] via phone     PHYSICIAN       Additional Comment:    _______________________________________________ Standley Brooking, LCSW 08/13/2015, 2:54 PM

## 2015-08-13 NOTE — Progress Notes (Signed)
Physical Therapy Treatment Patient Details Name: Megan Sims MRN: [id number] DOB: 05-Nov-1955 Today's Date: September 08, 2015    History of Present Illness 60 yo female admitted with Left femoral shaft fx. S/P L hip trochanteric nail 08/07/15.  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     PT Comments    Pt agreeable to attempt OOB to [facility] today however unable to tolerate mobility due to pain.    Follow Up Recommendations  SNF     Equipment Recommendations  None recommended by PT (defer to SNF)    Recommendations for Other Services       Precautions / Restrictions Precautions Precautions: Fall Restrictions Weight Bearing Restrictions: Yes LLE Weight Bearing: Partial weight bearing    Mobility  Bed Mobility Overal bed mobility: Needs Assistance;+2 for physical assistance Bed Mobility: Supine to Sit;Sit to Supine Rolling: Mod assist   Supine to sit: Max assist;+2 for physical assistance Sit to supine: Max assist;+2 for physical assistance   General bed mobility comments: attempted to get to EOB however pt unable to tolerate full upright positioning and requiring increased assist, pt reports pain untolerable and declined further OOB activity (RN notified), pt was requesting trying to get to [facility] upon arrival so pt assisted with rolling to place/remove bed pan  Transfers                    Ambulation/Gait                 Stairs            Wheelchair Mobility    Modified Rankin (Stroke Patients Only)       Balance                                    Cognition Arousal/Alertness: Awake/alert Behavior During Therapy: WFL for tasks assessed/performed Overall Cognitive Status: Within Functional Limits for tasks assessed                      Exercises      General Comments        Pertinent Vitals/Pain Pain Assessment: 0-10 Pain Score: 10-Worst pain ever Pain  Location: L hip Pain Descriptors / Indicators: Sore;Aching;Guarding;Grimacing Pain Intervention(s): Limited activity within patient's tolerance;Monitored during session;Patient requesting pain meds-RN notified;Premedicated before session;Repositioned    Home Living                      Prior Function            PT Goals (current goals can now be found in the care plan section) Progress towards PT goals: Progressing toward goals    Frequency  Min 3X/week    PT Plan Current plan remains appropriate    Co-evaluation             End of Session   Activity Tolerance: Patient limited by pain Patient left: with call bell/phone within reach;in bed;with bed alarm set     Time: 6759-1638 PT Time Calculation (min) (ACUTE ONLY): 22 min  Charges:  $Therapeutic Activity: 8-22 mins                    G Codes:      Anya Murphey,KATHrine E 09/08/15, 1:09 PM Carmelia Bake, PT, DPT 09-08-2015 Pager: [phone]

## 2015-08-13 NOTE — Clinical Social Work Placement (Signed)
Patient has a bed at [facility]. Anticipating discharge today, patient's son, [name] aware.    Raynaldo Opitz, [facility] Clinical Social Worker cell #: [phone]     CLINICAL SOCIAL WORK PLACEMENT  NOTE  Date:  08/13/2015  Patient Details  Name: Megan Sims MRN: [id number] Date of Birth: 1956/01/17  Clinical Social Work is seeking post-discharge placement for this patient at the [place] level of care (*CSW will initial, date and re-position this form in  chart as items are completed):  Yes   Patient/family provided with [place] Work Department's list of facilities offering this level of care within the geographic area requested by the patient (or if unable, by the patient's family).  Yes   Patient/family informed of their freedom to choose among providers that offer the needed level of care, that participate in Medicare, Medicaid or managed care program needed by the patient, have an available bed and are willing to accept the patient.  Yes   Patient/family informed of [facility]'s ownership interest in [facility] and [facility], as well as of the fact that they are under no obligation to receive care at these facilities.  PASRR submitted to EDS on 08/07/15     PASRR number received on 08/07/15     Existing PASRR number confirmed on       FL2 transmitted to all facilities in geographic area requested by pt/family on 08/07/15     FL2 transmitted to all facilities within larger geographic area on       Patient informed that his/her managed care company has contracts with or will negotiate with certain facilities, including the following:        Yes   Patient/family informed of bed offers received.  Patient chooses bed at [facility]     Physician recommends and patient chooses bed at      Patient to be transferred to [facility] on   .  Patient to be transferred to facility by       Patient family notified on   of transfer.  Name of family member notified:        PHYSICIAN Please prepare priority discharge summary, including medications, Please prepare prescriptions, Please sign FL2     Additional Comment:    _______________________________________________ Standley Brooking, LCSW 08/13/2015, 12:16 PM

## 2015-08-13 NOTE — Progress Notes (Signed)
Megan Sims   DOB:Sep 12, 1955   SH#:[id number]    Subjective: She feels well. She has no further bleeding. Her skin rash has improved. Overall, she has no complaints this morning  Objective:  Filed Vitals:   08/12/15 2136 08/13/15 0453  BP: 167/52 188/86  Pulse: 86 90  Temp: 98 F (36.7 C) 98.2 F (36.8 C)  Resp: 20 20     Intake/Output Summary (Last 24 hours) at 08/13/15 0819 Last data filed at 08/12/15 2300  Gross per 24 hour  Intake    965 ml  Output    525 ml  Net    440 ml    GENERAL:alert, no distress and comfortable SKIN: skin color, texture, turgor are normal, no rashes or significant lesions. Noted crusting around her face but the erythema has resolved EYES: normal, Conjunctiva are pink and non-injected, sclera clear Musculoskeletal:no cyanosis of digits and no clubbing  NEURO: alert & oriented x 3 with fluent speech, no focal motor/sensory deficits   Labs:  Lab Results  Component Value Date   WBC 12.8* 08/13/2015   HGB 10.6* 08/13/2015   HCT 30.4* 08/13/2015   MCV 88.4 08/13/2015   PLT 118* 08/13/2015   NEUTROABS 9.5* 08/13/2015    Lab Results  Component Value Date   NA 138 08/11/2015   K 3.7 08/11/2015   CL 100* 08/11/2015   CO2 32 08/11/2015   Assessment & Plan:   Bilateral lung cancer ([place]) Given those CT scan showed no definitive signs of cancer recurrence, I'm concerned about the complete right middle lobe collapse. I discussed her case yesterday at the ENT tumor board on 08/11/15 and we did not see any signs to suggest definitive cancer recurrence. I will repeat serial imaging study with CT scan of the chest in 3 months  Recent fall with hip fracture status post ORIF Continue postoperative care  Recent bleeding, resolved Elevated APTT, resolved She had recent active bleeding. She had elevated APTT but it resolved and corrected with mixing study. This is not compatible with inhibitor. It could be related to accumulation of heparin effect, reduced  clotting factor production due to synthetic liver dysfunction or consumption. Heparin subcutaneous injection has been stopped. I have ordered coagulation study on 08/12/15 which appeared within normal limits. I recommend close monitoring of incision site and transfuse as needed to keep hemoglobin greater than 8  Chronic neck pain Her pain is currently under control.  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 Overall, I do not believe she Stevens-Johnson syndrome. Rather, I suspect she may have dermatomyositis. In the short-term, I will put her on low-dose prednisone therapy I have requested rheumatology consult as an outpatient I have discontinue hydrocortisone and switch her to prednisone 20 mg daily.  Dysphagia She has chronic dysphagia with possible risk of aspiration. The cause is unknown but that could be related to esophageal stricture. Neuromuscular involvement of dermatomyositis cannot be excluded She is dependent on feeding tube.  S/P gastrostomy ([place]) The feeding tube site looks okay without signs of infection. She will continue nutritional supplement as directed  Discharge planning She can be discharged from my standpoint.  I reviewed the plan of care with her son today. I have placed appointment to see her back next month on 09/22/2015 I will sign off. Call if questions arise  [facility], Sheli Dorin, MD 08/13/2015  8:19 AM

## 2015-08-13 NOTE — Progress Notes (Signed)
Report called to Althea Charon, nurse Olu. All questions answered. Patient alert and ready, awaiting transportation. Family at bedside.

## 2015-08-13 NOTE — Discharge Summary (Signed)
Physician Discharge Summary  Megan Sims IPJ:[id number] DOB: 1955-11-28 DOA: 08/06/2015  PCP: Rosita Fire, MD  Admit date: 08/06/2015 Discharge date: 08/13/2015  Time spent: > 35 minutes  Recommendations for Outpatient Follow-up:  1. Monitor blood pressures. Were elevated as such will continue amlodipine on discharge. 2. Pt to follow up with orthopaedic surgery group after discharge   Discharge Diagnoses:  Principal Problem:   Left femoral shaft fracture ([place]) Active Problems:   Essential hypertension   Bilateral lung cancer ([place])   Paget's disease of bone   Anemia due to antineoplastic chemotherapy   Emphysema of lung ([place])   Fall from slip, trip, or stumble   Acute blood loss anemia   Closed fracture of left femur ([place])   Bleeding   Discharge Condition: stable  Diet recommendation: continue tube feeds  Filed Weights   08/07/15 0243  Weight: 53.978 kg (119 lb)    History of present illness:  60 with Pagets disease, Lung CA stage 3b (diagnosed 8/16) s/p chemoradiation including radiation to neck, chronic dysphagia, esophageal stricture s/p PEG in 07/06/15, PUD, dermatomyositis on prednisone, right middle lobe collapse on CT last week, severe weakness of legs who presents after a fall while climbing stairs. Found to have a fracture of left femur. Is s/p left hip intertroch nail on 7/15.  Hospital Course:  Principal Problem:  Left femoral shaft fracture - wound oozing but reportedly less - ortho managing while patient in house - per ortho- partial weight bearing - stop aspirin and heparin - SNF referral  Active Problems:  Bleeding - constant oozing from heparin sites and from hip wounds starting overnight  - coags show PTT elevated- mixing study ordered - oncology assisting. Suspects buildup of heparin in her system. Currently off heparin.  - stopped heparin and Aspirin   Anemia of chronic disease with acute blood loss  - improved after cessation of bleeding  and transfusion - hgb stable at 10.7   Essential hypertension - Norvasc on hold   Bilateral lung cancer  - management per Heme/onc- her case was to be discussed this week at ENT tumor board this week   Paget's disease of bone - asymptomatic with normal alk phos - continue to follow  Dermatomyositis - this is the cause of her fall and fracture - continue current prednisone dose and regimen.   Dysphagia - cont PEG feeds/ free water- tolerating well  PUD - PPI and Sucralfate- avoid NSAIDs  Chronic neck pain - Hycet PRN- she takes it anywhere from 2- 4 x day as needed  Procedures:  None  Consultations:  none  Discharge Exam: Filed Vitals:   08/13/15 0453 08/13/15 1233  BP: 188/86 188/74  Pulse: 90 98  Temp: 98.2 F (36.8 C) 98.4 F (36.9 C)  Resp: 20 20    General: Pt in nad, alert and awake Cardiovascular: rrr, no rubs Respiratory: no increased wob, no wheezes  Discharge Instructions   Discharge Instructions    Call MD for:  extreme fatigue    Complete by:  As directed      Call MD for:  severe uncontrolled pain    Complete by:  As directed      Call MD for:  temperature >100.4    Complete by:  As directed      Diet - low sodium heart healthy    Complete by:  As directed      Discharge instructions    Complete by:  As directed   Please be sure  to follow up with your primary care physician.  Please monitor cbc within the next week after hospital discharge.     Increase activity slowly    Complete by:  As directed           Current Discharge Medication List    START taking these medications   Details  HYDROcodone-acetaminophen (NORCO/VICODIN) 5-325 MG tablet Take 1-2 tablets by mouth every 6 (six) hours as needed for moderate pain. Qty: 40 tablet, Refills: 0      CONTINUE these medications which have CHANGED   Details  predniSONE (DELTASONE) 20 MG tablet Take 1 tablet (20 mg total) by mouth daily with breakfast.   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 Qty: 237 mL, Refills: 3    amLODipine (NORVASC) 10 MG tablet Take 1 tablet (10 mg total) by mouth daily. Qty: 30 tablet, Refills: 6    omeprazole (PRILOSEC) 20 MG capsule Liquid Form 2 mg/ml.  Take 10 ml (20 mg) twice daily. Qty: 60 capsule, Refills: 0    sucralfate (CARAFATE) 1 GM/10ML suspension Take 10 mLs (1 g total) by mouth 4 (four) times daily -  with meals and at bedtime. Qty: 420 mL, Refills: 9      STOP taking these medications     HYDROcodone-acetaminophen (HYCET) 7.5-325 mg/15 ml solution        No Known Allergies Follow-up Information    Follow up with DALLDORF,PETER G, MD. Schedule an appointment as soon as possible for a visit in 2 weeks.   Specialty:  Orthopedic Surgery   Contact information:   [facility] [id number] [phone]        The results of significant diagnostics from this hospitalization (including imaging, microbiology, ancillary and laboratory) are listed below for reference.    Significant Diagnostic Studies: Dg Lumbar Spine Complete  07/27/2015  CLINICAL DATA:  Paget's disease. History of lung cancer. Lumbar pain extending to the left pelvis and leg. EXAM: LUMBAR SPINE - COMPLETE 4+ VIEW COMPARISON:  CT 01/28/2015 FINDINGS: No evidence of regional fracture. Chronic changes of Paget's disease affecting T11, L1, L4, L5 and the sacrum. Lower lumbar facet arthropathy could also contribute to pain. Also noted is aortic atherosclerosis. IMPRESSION: No acute finding. Chronic Paget's disease affecting T11, L1, L4, L5 and the sacrum. Lower lumbar facet arthropathy could also contribute to pain. Aortic atherosclerosis. Electronically Signed   By: Nelson Chimes M.D.   On: 07/27/2015 20:55   Dg Pelvis  1-2 Views  08/06/2015  CLINICAL DATA:  Left leg pain after a fall today. EXAM: PELVIS - 1-2 VIEW COMPARISON:  None. FINDINGS: There is a transverse minimally comminuted fracture of the proximal left femoral shaft below the level of lesser trochanter. There is impaction and medial angulation of the distal fracture fragment. Diffuse coarsening of trabecular pattern in the pelvis bilaterally consistent with Paget's disease. Patches changes also probably involving the sacrum although limited visualization due to overlying bowel. Calcifications in the pelvis consistent with phleboliths. IMPRESSION: Transverse mildly comminuted fracture of the proximal left femoral shaft with medial angulation and impaction of the fracture fragments. Changes of Paget's disease in the pelvis and sacrum. Electronically Signed   By: Lucienne Capers M.D.   On: 08/06/2015 22:36   Dg Tibia/fibula Left  08/06/2015  CLINICAL DATA:  60 year old female with fall and left leg pain. EXAM: LEFT TIBIA AND FIBULA - 2 VIEW; LEFT FOOT - COMPLETE 3+ VIEW; LEFT FEMUR 2 VIEWS COMPARISON:  Pelvic radiograph dated 08/06/2015 FINDINGS: There is a transverse fracture of the left femoral proximal diaphysis with mild medial angulation and impaction. No other acute fracture identified. There is no dislocation. There is bony expansion of the pelvis with trabecular coarsening compatible with Paget's disease. There is also pagetoid changes of the tibia with cortical thickening and bowing of the bone. The soft tissues appear unremarkable IMPRESSION: Mildly angulated and impacted fracture of the proximal left femoral diaphysis. Pagetoid changes of the pelvic bone and tibia. Electronically Signed   By: Anner Crete M.D.   On: 08/06/2015 22:40   Dg Ankle Complete Left  07/27/2015  CLINICAL DATA:  Lower extremity pain without injury. History of Paget's disease and lung cancer. EXAM: LEFT ANKLE COMPLETE - 3+ VIEW COMPARISON:  08/29/2009 FINDINGS: no abnormality  at the ankle joint itself. Paget's disease of the tibia again seen with thickened well of an appearance of the cortex. No evidence of fracture or focal lesion. IMPRESSION: No acute finding.  Chronic changes of Paget's disease of the tibia. Electronically Signed   By: Nelson Chimes M.D.   On: 07/27/2015 20:51   Ct Chest W Contrast  08/02/2015  CLINICAL DATA:  History of moderately to poorly differentiated carcinoma of likely lung primary. Status post radiation to right chest supraclavicular area. EXAM: CT CHEST WITH CONTRAST TECHNIQUE: Multidetector CT imaging of the chest was performed during intravenous contrast administration. CONTRAST:  81m ISOVUE-300 IOPAMIDOL (ISOVUE-300) INJECTION 61% COMPARISON:  06/14/2015 FINDINGS: Mediastinum / Lymph Nodes: Prominent axillary lymph nodes bilaterally demonstrate central fatty hila but have progressed slightly in the interval. Right axillary lymph node measures up to 10 mm short axis. No mediastinal or hilar lymphadenopathy. Heart size is normal. Coronary artery calcification is noted. The esophagus has normal imaging features. There is some edema in the soft tissues of the lower neck and upper chest anteriorly. No definite supraclavicular lesion is identified. Lungs / Pleura: Lung windows again demonstrate emphysema. Interstitial and irregular confluent airspace opacity in the posterior right upper lobe is stable in the interval. The patient has developed progressive right middle lobe collapse since prior CT without a discrete centrally obstructing lesion evident. 4 mm left lower lobe pulmonary nodule is stable. No pulmonary edema or pleural effusion. Upper Abdomen: Gastrostomy tube again noted. Atherosclerotic calcification noted in the wall of the incompletely visualized abdominal aorta. 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 IMPRESSION: 1. Interval development of complete right middle lobe collapse without a  discrete central obstructing lesion evident. 2. The irregular ill-defined central airspace opacity identified previously in the posterior right upper lobe is not substantially changed in the interval. 3. Coronary artery and abdominal aortic atherosclerosis. 4. 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 Electronically Signed   By: EMisty StanleyM.D.   On: 08/02/2015 15:28   [place]Venous Img Lower Unilateral Left  07/28/2015  CLINICAL DATA:  Left lower extremity pain for 1 week. Evaluate for DVT. EXAM: Left LOWER EXTREMITY VENOUS DOPPLER ULTRASOUND TECHNIQUE: Gray-scale sonography with graded compression, as well as color Doppler and duplex ultrasound were performed to evaluate the lower extremity deep venous systems from the level of the common femoral vein and including the common femoral, femoral, profunda femoral, popliteal and calf  veins including the posterior tibial, peroneal and gastrocnemius veins when visible. The superficial great saphenous vein was also interrogated. Spectral Doppler was utilized to evaluate flow at rest and with distal augmentation maneuvers in the common femoral, femoral and popliteal veins. COMPARISON:  08/29/2009 FINDINGS: Contralateral Common Femoral Vein: Respiratory phasicity is normal and symmetric with the symptomatic side. No evidence of thrombus. Normal compressibility. Common Femoral Vein: No evidence of thrombus. Saphenofemoral Junction: No evidence of thrombus. Profunda Femoral Vein: No evidence of thrombus. Femoral Vein: No evidence of thrombus. Popliteal Vein: No evidence of thrombus. Calf Veins: No evidence of thrombus. Superficial Great Saphenous Vein: No evidence of thrombus. IMPRESSION: No evidence of left lower extremity deep venous thrombosis. Electronically Signed   By: Monte Fantasia M.D.   On: 07/28/2015 10:49   Dg Chest Port 1 View  08/07/2015  CLINICAL DATA:  Preoperative. History of lung abnormality, hypertension, emphysema,  former smoker. EXAM: PORTABLE CHEST 1 VIEW COMPARISON:  CT chest 08/02/2015.  Chest 06/14/2015. FINDINGS: 11 mm diameter spiculated nodule in the right mid lung. This is smaller than the opacity seen on prior chest radiograph. Decreasing size may indicate that this is resolving inflammatory process. However, based on morphology the lesion is indeterminate and malignancy should be excluded. Left lung is clear. Power port type central venous catheter with tip over the mid SVC region. Normal heart size and pulmonary vascularity. Calcified and tortuous aorta. IMPRESSION: 11 mm spiculated nodule in the right mid lung. The opacity is smaller than on prior study, possibly indicating resolving inflammatory process. Malignancy should still be excluded however. Follow-up until resolution is recommended. Electronically Signed   By: Lucienne Capers M.D.   On: 08/07/2015 00:21   Dg Knee Complete 4 Views Left  07/27/2015  CLINICAL DATA:  History of Paget's disease. Pain at the left lower extremity. No known injury. EXAM: LEFT KNEE - COMPLETE 4+ VIEW COMPARISON:  08/29/2009 FINDINGS: No joint effusion. No fracture, dislocation or degenerative change. No focal bone lesion in this region. IMPRESSION: Negative. Electronically Signed   By: Nelson Chimes M.D.   On: 07/27/2015 20:53   Dg Foot Complete Left  08/06/2015  CLINICAL DATA:  60 year old female with fall and left leg pain. EXAM: LEFT TIBIA AND FIBULA - 2 VIEW; LEFT FOOT - COMPLETE 3+ VIEW; LEFT FEMUR 2 VIEWS COMPARISON:  Pelvic radiograph dated 08/06/2015 FINDINGS: There is a transverse fracture of the left femoral proximal diaphysis with mild medial angulation and impaction. No other acute fracture identified. There is no dislocation. There is bony expansion of the pelvis with trabecular coarsening compatible with Paget's disease. There is also pagetoid changes of the tibia with cortical thickening and bowing of the bone. The soft tissues appear unremarkable IMPRESSION:  Mildly angulated and impacted fracture of the proximal left femoral diaphysis. Pagetoid changes of the pelvic bone and tibia. Electronically Signed   By: Anner Crete M.D.   On: 08/06/2015 22:40   Dg C-arm 1-60 Min-no Report  08/07/2015  CLINICAL DATA: left hip fracture C-ARM 1-60 MINUTES Fluoroscopy was utilized by the requesting physician.  No radiographic interpretation.   Dg Hip Operative Unilat With Pelvis Left  08/07/2015  CLINICAL DATA:  IM nail of left leg. EXAM: DG C-ARM 1-60 MIN-NO REPORT; OPERATIVE LEFT HIP WITH PELVIS COMPARISON:  07/27/2015. FINDINGS: Four intraoperative spot fluoro film show placement of a dynamic hip screw with a long femoral intra medullary nail. Single distal interlocking screw evident. Nail crosses a proximal femur fracture with marked improvement in  alignment compared to pre reduction films. IMPRESSION: Intraoperative assessment during ORIF for proximal femur fracture. No evidence for immediate hardware complications. Electronically Signed   By: Misty Stanley M.D.   On: 08/07/2015 15:35   Dg Hip Unilat With Pelvis 2-3 Views Left  07/27/2015  CLINICAL DATA:  Paget's disease. Pain of the left lower extremity. History of lung cancer. EXAM: DG HIP (WITH OR WITHOUT PELVIS) 2-3V LEFT COMPARISON:  None. FINDINGS: Paget's disease diffusely involving the pelvis an the sacrum. No involvement of the proximal femurs. No evidence of fracture or focal destructive lesion. IMPRESSION: Paget's disease diffusely and chronically affecting the bones of the pelvis and the sacrum. No acute finding. Hip joint appears unremarkable. Electronically Signed   By: Nelson Chimes M.D.   On: 07/27/2015 20:52   Dg Femur Min 2 Views Left  08/06/2015  CLINICAL DATA:  60 year old female with fall and left leg pain. EXAM: LEFT TIBIA AND FIBULA - 2 VIEW; LEFT FOOT - COMPLETE 3+ VIEW; LEFT FEMUR 2 VIEWS COMPARISON:  Pelvic radiograph dated 08/06/2015 FINDINGS: There is a transverse fracture of the left  femoral proximal diaphysis with mild medial angulation and impaction. No other acute fracture identified. There is no dislocation. There is bony expansion of the pelvis with trabecular coarsening compatible with Paget's disease. There is also pagetoid changes of the tibia with cortical thickening and bowing of the bone. The soft tissues appear unremarkable IMPRESSION: Mildly angulated and impacted fracture of the proximal left femoral diaphysis. Pagetoid changes of the pelvic bone and tibia. Electronically Signed   By: Anner Crete M.D.   On: 08/06/2015 22:40    Microbiology: Recent Results (from the past 240 hour(s))  Surgical pcr screen     Status: None   Collection Time: 08/07/15  7:46 AM  Result Value Ref Range Status   MRSA, PCR NEGATIVE NEGATIVE Final   Staphylococcus aureus NEGATIVE NEGATIVE Final    Comment:        The Xpert SA Assay (FDA approved for NASAL specimens in patients over 10 years of age), is one component of a comprehensive surveillance program.  Test performance has been validated by [facility] for patients greater than or equal to 6 year old. It is not intended to diagnose infection nor to guide or monitor treatment.      Labs: Basic Metabolic Panel:  Recent Labs Lab 08/07/15 0924 08/08/15 0500 08/09/15 0545 08/10/15 0405 08/11/15 0530  NA 137 135 137 137 138  K 4.2 4.7 4.4 4.0 3.7  CL 103 101 102 99* 100*  CO2 29 29 31 31  32  GLUCOSE 100* 128* 116* 128* 137*  BUN 32* 33* 34* 31* 29*  CREATININE 0.75 0.85 0.67 0.62 0.59  CALCIUM 8.7* 8.3* 8.0* 8.0* 7.8*   Liver Function Tests:  Recent Labs Lab 08/07/15 0924  AST 54*  ALT 21  ALKPHOS 68  BILITOT 0.4  PROT 6.0*  ALBUMIN 3.0*   No results for input(s): LIPASE, AMYLASE in the last 168 hours. No results for input(s): AMMONIA in the last 168 hours. CBC:  Recent Labs Lab 08/06/15 2121  08/11/15 0530 08/11/15 2015 08/12/15 0830 08/12/15 2153 08/13/15 0355 08/13/15 1050  WBC 8.6   < > 10.2  --  13.4* 13.3* 12.8* 13.7*  NEUTROABS 6.6  --   --   --  9.4*  --  9.5*  --   HGB 9.5*  < > 7.4* 5.3* 9.7* 10.6* 10.6* 10.7*  HCT 29.2*  < > 21.0* 15.3*  27.7* 29.7* 30.4* 31.4*  MCV 92.4  < > 84.3  --  89.1 87.6 88.4 89.0  PLT 321  < > 110*  --  104* 103* 118* 123*  < > = values in this interval not displayed. Cardiac Enzymes: No results for input(s): CKTOTAL, CKMB, CKMBINDEX, TROPONINI in the last 168 hours. BNP: BNP (last 3 results) No results for input(s): BNP in the last 8760 hours.  ProBNP (last 3 results) No results for input(s): PROBNP in the last 8760 hours.  CBG: No results for input(s): GLUCAP in the last 168 hours.   Signed:  Velvet Bathe MD.  [facility] 08/13/2015, 2:31 PM

## 2015-08-14 ENCOUNTER — Telehealth: Payer: Self-pay | Admitting: Hematology and Oncology

## 2015-08-14 NOTE — Telephone Encounter (Signed)
Lvm advising appt 8/30 @ 9.30am.

## 2015-08-16 ENCOUNTER — Other Ambulatory Visit: Payer: Self-pay

## 2015-08-16 MED ORDER — HYDROCODONE-ACETAMINOPHEN 5-325 MG PO TABS: 1.0000 | ORAL_TABLET | Freq: Four times a day (QID) | ORAL | 0 refills | Status: DC | PRN

## 2015-08-16 NOTE — Telephone Encounter (Signed)
RX faxed to [facility] fax # [phone] Phone #[phone] 

## 2015-08-19 ENCOUNTER — Emergency Department (HOSPITAL_COMMUNITY)
Admission: EM | Admit: 2015-08-19 | Discharge: 2015-08-19 | Disposition: A | Discharge status: Home / Self Care | Payer: Medicaid Other | Attending: Emergency Medicine | Admitting: Emergency Medicine

## 2015-08-19 ENCOUNTER — Encounter (HOSPITAL_COMMUNITY): Payer: Self-pay | Admitting: Emergency Medicine

## 2015-08-19 ENCOUNTER — Telehealth: Payer: Self-pay | Admitting: *Deleted

## 2015-08-19 ENCOUNTER — Emergency Department (HOSPITAL_COMMUNITY): Payer: Medicaid Other

## 2015-08-19 DIAGNOSIS — R609 Edema, unspecified: Secondary | ICD-10-CM | POA: Diagnosis not present

## 2015-08-19 DIAGNOSIS — M7989 Other specified soft tissue disorders: Secondary | ICD-10-CM | POA: Diagnosis present

## 2015-08-19 DIAGNOSIS — I10 Essential (primary) hypertension: Secondary | ICD-10-CM | POA: Insufficient documentation

## 2015-08-19 DIAGNOSIS — Z87891 Personal history of nicotine dependence: Secondary | ICD-10-CM | POA: Insufficient documentation

## 2015-08-19 DIAGNOSIS — M360 Dermato(poly)myositis in neoplastic disease: Secondary | ICD-10-CM

## 2015-08-19 DIAGNOSIS — Z85118 Personal history of other malignant neoplasm of bronchus and lung: Secondary | ICD-10-CM | POA: Insufficient documentation

## 2015-08-19 DIAGNOSIS — Z79899 Other long term (current) drug therapy: Secondary | ICD-10-CM | POA: Insufficient documentation

## 2015-08-19 LAB — CBC WITH DIFFERENTIAL/PLATELET
Basophils Absolute: 0 10*3/uL (ref 0.0–0.1)
Basophils Relative: 0 %
EOS PCT: 0 %
Eosinophils Absolute: 0 10*3/uL (ref 0.0–0.7)
HCT: 29 % — ABNORMAL LOW (ref 36.0–46.0)
Hemoglobin: 9.5 g/dL — ABNORMAL LOW (ref 12.0–15.0)
LYMPHS ABS: 0.3 10*3/uL — AB (ref 0.7–4.0)
LYMPHS PCT: 3 %
MCH: 31.3 pg (ref 26.0–34.0)
MCHC: 32.8 g/dL (ref 30.0–36.0)
MCV: 95.4 fL (ref 78.0–100.0)
MONO ABS: 1.5 10*3/uL — AB (ref 0.1–1.0)
MONOS PCT: 11 %
Neutro Abs: 11.4 10*3/uL — ABNORMAL HIGH (ref 1.7–7.7)
Neutrophils Relative %: 86 %
PLATELETS: 231 10*3/uL (ref 150–400)
RBC: 3.04 MIL/uL — AB (ref 3.87–5.11)
RDW: 18.1 % — ABNORMAL HIGH (ref 11.5–15.5)
WBC: 13.2 10*3/uL — AB (ref 4.0–10.5)

## 2015-08-19 LAB — COMPREHENSIVE METABOLIC PANEL
ALK PHOS: 71 U/L (ref 38–126)
ALT: 23 U/L (ref 14–54)
AST: 57 U/L — AB (ref 15–41)
Albumin: 3.1 g/dL — ABNORMAL LOW (ref 3.5–5.0)
Anion gap: 8 (ref 5–15)
BUN: 24 mg/dL — AB (ref 6–20)
CHLORIDE: 101 mmol/L (ref 101–111)
CO2: 29 mmol/L (ref 22–32)
Calcium: 8.5 mg/dL — ABNORMAL LOW (ref 8.9–10.3)
Creatinine, Ser: 0.6 mg/dL (ref 0.44–1.00)
GFR calc Af Amer: >60 mL/min (ref >60)
GFR calc non Af Amer: >60 mL/min (ref >60)
GLUCOSE: 132 mg/dL — AB (ref 65–99)
POTASSIUM: 3.7 mmol/L (ref 3.5–5.1)
SODIUM: 138 mmol/L (ref 135–145)
Total Bilirubin: 1.1 mg/dL (ref 0.3–1.2)
Total Protein: 5.9 g/dL — ABNORMAL LOW (ref 6.5–8.1)

## 2015-08-19 MED ORDER — PREDNISONE 20 MG PO TABS: 20.0000 mg | ORAL_TABLET | Freq: Every day | ORAL | 0 refills | Status: DC

## 2015-08-19 NOTE — ED Notes (Signed)
Patient with c/o bilateral leg swelling. Left > Right. Pedal pulses present. Patient states left leg surgery earlier this month. States she left her 30 day rehab early because "they weren't doing anything for me." Has not been getting rehab since leaving facility.

## 2015-08-19 NOTE — Telephone Encounter (Signed)
The swelling is not new She should be taking prednisone 20 mg daily (I reduced her dose in the hospital)

## 2015-08-19 NOTE — Telephone Encounter (Signed)
Daughter left VM states pt came home from Rehab because they were not treating her right.   She says pt does not know what medications she is supposed to be taking and family does not know what her medications are supposed to be either.   Pt is having some swelling in her feet and legs.

## 2015-08-19 NOTE — ED Triage Notes (Signed)
Pt family reports pt had femur repair on 08/07/15 of left leg. Pt family reports for last several days BLE swelling and pain. Pt denies any new or recent injury. Moderate swelling noted to LLE.

## 2015-08-19 NOTE — Discharge Instructions (Signed)
The swelling will improve with both elevation, and ambulation.  Elevate your feet above your heart, as much as possible.  Walk using your walker, as tolerated, to improve the swelling.

## 2015-08-19 NOTE — ED Provider Notes (Signed)
[facility] [facility] DEPT Provider Note   CSN: [id number] Arrival date & time: 08/19/15  1537  First Provider Contact:  First MD Initiated Contact with Patient 08/19/15 1642        History   Chief Complaint Chief Complaint  Patient presents with  . Leg Swelling    HPI Megan Sims is a 60 y.o. female.  She presents for evaluation of bilateral lower extremity swelling, left greater than right, which she states started today. She's recovering at home from a recent left femur fracture. She had been in assisted living until 4 days ago but left because she didn't feel like she was getting proper care. She is here with family members, from home. She is taking her prescribed medications. She denies fever, chills, nausea, vomiting, cough, shortness of breath or chest pain. There's been no nausea, vomiting. There are no other known modifying factors.  HPI  Past Medical History:  Diagnosis Date  . Cancer [facility])    malignant neoplasm of unknown origin  . Cerebral aneurysm 2 brain surgeries 96 or 97  . Chronic neck pain 09/24/2014  . Diarrhea   . Emphysema of lung ([place]) 05/05/2015  . Hypertension   . Left knee pain   . Left leg weakness 06/2015  . Lung abnormality   . Malignant neoplasm of unknown origin ([place])   . Migraine   . Nicotine dependence 09/24/2014  . Oral thrush 09/24/2014  . Paget disease of bone   . Pleurisy   . Renal insufficiency    Patient states " no kidney problems  . S/P biopsy    of throat per patient.  . Skin ulcer ([place]) 11/18/2014    Patient Active Problem List   Diagnosis Date Noted  . Closed fracture of left femur ([place])   . Bleeding   . Acute blood loss anemia   . Fall from slip, trip, or stumble 08/07/2015  . Left femoral shaft fracture ([place]) 08/07/2015  . 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 S/P gastrostomy ([place]) 08/04/2015  . Deficiency anemia 08/03/2015  . Dehydration 07/02/2015  . Candida esophagitis ([place])   . Anemia due  to other cause   . Mucosal abnormality of stomach   . Pill esophagitis due to tetracycline 05/24/2015  . UTI (lower urinary tract infection) 05/24/2015  . Emphysema of lung ([place]) 05/05/2015  . Rash, skin 05/05/2015  . Severe sepsis ([place]) 04/10/2015  . AKI (acute kidney injury) ([place]) 04/10/2015  . Hyponatremia 04/10/2015  . Sepsis due to pneumonia ([place])   . CAP (community acquired pneumonia) 04/09/2015  . Skin ulcer ([place]) 11/18/2014  . Musculoskeletal chest pain 10/25/2014  . Pain in the chest   . Tachycardia   . Anemia due to antineoplastic chemotherapy 10/21/2014  . Cancer of upper lobe of right lung ([place]) 10/07/2014  . Oral thrush 09/24/2014  . Nicotine dependence 09/24/2014  . Chronic neck pain 09/24/2014  . Excessive weight loss 09/24/2014  . 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 Bilateral lung cancer ([place])   . Paget's disease of bone   . Dysphagia 09/05/2014  . Essential hypertension 09/05/2014    Past Surgical History:  Procedure Laterality Date  . CEREBRAL ANEURYSM REPAIR  96 or 97  . CHEST TUBE INSERTION    . COLONOSCOPY     10 years ago in [place]   . ESOPHAGOGASTRODUODENOSCOPY  06/16/2015   Dr. Gala Romney: edentulous cricopharyngeus, esophageal plaques, biopsy consistent with candida  . ESOPHAGOGASTRODUODENOSCOPY N/A 06/16/2015   Procedure: ESOPHAGOGASTRODUODENOSCOPY (EGD);  Surgeon: Daneil Dolin, MD;  Location[facility];  Service: Endoscopy;  Laterality: N/A;  . ESOPHAGOGASTRODUODENOSCOPY N/A 07/06/2015   Procedure: ESOPHAGOGASTRODUODENOSCOPY (EGD);  Surgeon: Danie Binder, MD;  Location: [facility] ENDO SUITE;  Service: Endoscopy;  Laterality: N/A;  . INTRAMEDULLARY (IM) NAIL INTERTROCHANTERIC Left 08/07/2015   Procedure: INTRAMEDULLARY (IM) NAIL INTERTROCHANTRIC;  Surgeon: Melrose Nakayama, MD;  Location: [facility] ORS;  Service: Orthopedics;  Laterality: Left;  . LARYNGOSCOPY  Jun 22, 2015   [facility], Dr. Erik Obey: normal oropharynx, no lesions, mobile vocal cords with  good airway.  . PEG PLACEMENT N/A 07/06/2015   Procedure: PERCUTANEOUS ENDOSCOPIC GASTROSTOMY (PEG) PLACEMENT;  Surgeon: Danie Binder, MD;  Location: [facility] END[facility];  Service: Endoscopy;  Laterality: N/A;  . TUBAL LIGATION      OB History    Gravida Para Term Preterm AB Living             3   SAB TAB Ectopic Multiple Live Births                   Home Medications    Prior to Admission medications   Medication Sig Start Date End Date Taking? Authorizing Provider  Nutritional Supplements (FEEDING SUPPLEMENT, JEVITY 1.2 CAL,) LIQD Place 237 mLs into feeding tube every 3 (three) hours. 07/08/15  Yes Rosita Fire, MD  predniSONE (DELTASONE) 20 MG tablet Take 1 tablet (20 mg total) by mouth daily with breakfast. 08/19/15  Yes Heath Lark, MD  HYDROcodone-acetaminophen (NORCO/VICODIN) 5-325 MG tablet Take 1-2 tablets by mouth every 6 (six) hours as needed for moderate pain. 08/16/15   Tiffany L Reed, DO  omeprazole (PRILOSEC) 20 MG capsule Liquid Form 2 mg/ml.  Take 10 ml (20 mg) twice daily. 08/06/15   Heath Lark, MD  sucralfate (CARAFATE) 1 GM/10ML suspension Take 10 mLs (1 g total) by mouth 4 (four) times daily -  with meals and at bedtime. 08/06/15   Heath Lark, MD    Family History Family History  Problem Relation Age of Onset  . Hypertension    . Diabetes    . Kidney disease    . Cancer Mother     throat ca  . Cancer Maternal Grandmother     thyroid ca  . Colon cancer Neg Hx     Social History Social History  Substance Use Topics  . Smoking status: Former Smoker    Packs/day: 0.50    Years: 34.00    Types: Cigarettes  . Smokeless tobacco: Never Used     Comment: HOWEVER, will smoke 1 cigarette if "tragic" event (death), or if she needs it for her nerves   . Alcohol use No     Allergies   Review of patient's allergies indicates no known allergies.   Review of Systems Review of Systems  All other systems reviewed and are negative.    Physical Exam Updated Vital  Signs BP 162/80   Pulse 102   Temp 98.1 F (36.7 C) (Oral)   Resp 18   Ht '5\' 1"'$  (1.549 m)   Wt 119 lb (54 kg)   SpO2 100%   BMI 22.48 kg/m   Physical Exam  Constitutional: She appears well-developed. No distress.  She appears undernourished.  HENT:  Head: Normocephalic and atraumatic.  Eyes: Conjunctivae are normal.  Neck: Neck supple.  Cardiovascular: Normal rate and regular rhythm.   No murmur heard. Normal perfusion feet bilaterally.  Pulmonary/Chest: Effort normal and breath sounds normal. No respiratory distress.  Abdominal:  Soft. There is no tenderness.  Musculoskeletal: She exhibits edema (Left leg, moderate, from thigh down.).  Healing surgical wounds, left lateral thigh, both proximal and distal. No associated drainage, redness, or dehiscence. Staples intact in the wounds.  Neurological: She is alert. No cranial nerve deficit. She exhibits normal muscle tone.  Skin: Skin is warm and dry.  Psychiatric: She has a normal mood and affect.  Nursing note and vitals reviewed.    ED Treatments / Results  Labs (all labs ordered are listed, but only abnormal results are displayed) Labs Reviewed  COMPREHENSIVE METABOLIC PANEL - Abnormal; Notable for the following:       Result Value   Glucose, Bld 132 (*)    BUN 24 (*)    Calcium 8.5 (*)    Total Protein 5.9 (*)    Albumin 3.1 (*)    AST 57 (*)    All other components within normal limits  CBC WITH DIFFERENTIAL/PLATELET - Abnormal; Notable for the following:    WBC 13.2 (*)    RBC 3.04 (*)    Hemoglobin 9.5 (*)    HCT 29.0 (*)    RDW 18.1 (*)    Neutro Abs 11.4 (*)    Lymphs Abs 0.3 (*)    Monocytes Absolute 1.5 (*)    All other components within normal limits    EKG  EKG Interpretation  Date/Time:  Thursday August 19 2015 17:48:05 EDT Ventricular Rate:  102 PR Interval:    QRS Duration: 82 QT Interval:  371 QTC Calculation: 484 R Axis:   27 Text Interpretation:  Sinus tachycardia Probable left atrial  enlargement Anterior infarct, old Baseline wander in lead(s) V2 since last tracing no significant change Confirmed by Eulis Foster  MD, Yarexi Pawlicki [phone]) on 08/19/2015 5:52:30 PM       Radiology Dg Chest 2 View  Result Date: 08/19/2015 CLINICAL DATA:  Lung cancer with COPD.  Lower extremity swelling. EXAM: CHEST  2 VIEW COMPARISON:  08/06/2015. CT scan 08/02/2015. Chest x-ray 06/14/2015. FINDINGS: Mild asymmetric elevation of the right hemidiaphragm is stable. Scarring in the right mid lung is again noted. Likely a residual component of right middle lobe collapse. Left lung is clear. Nodular density overlying the left lung base suggests nipple shadow, as seen previously. Left Port-A-Cath tip is positioned at the proximal SVC level. IMPRESSION: Stable.  No new or progressive findings. Electronically Signed   By: Misty Stanley M.D.   On: 08/19/2015 17:56  US Venous Img Lower Unilateral Left  Result Date: 08/19/2015 CLINICAL DATA:  Left femur surgery on 07/2015, now with pain and edema. History of smoking. Evaluate for DVT. EXAM: LEFT LOWER EXTREMITY VENOUS DOPPLER ULTRASOUND TECHNIQUE: Gray-scale sonography with graded compression, as well as color Doppler and duplex ultrasound were performed to evaluate the lower extremity deep venous systems from the level of the common femoral vein and including the common femoral, femoral, profunda femoral, popliteal and calf veins including the posterior tibial, peroneal and gastrocnemius veins when visible. The superficial great saphenous vein was also interrogated. Spectral Doppler was utilized to evaluate flow at rest and with distal augmentation maneuvers in the common femoral, femoral and popliteal veins. COMPARISON:  None. FINDINGS: Contralateral Common Femoral Vein: Respiratory phasicity is normal and symmetric with the symptomatic side. No evidence of thrombus. Normal compressibility. Common Femoral Vein: No evidence of thrombus. Normal compressibility, respiratory  phasicity and response to augmentation. Saphenofemoral Junction: No evidence of thrombus. Normal compressibility and flow on color Doppler imaging. Profunda Femoral Vein:  No evidence of thrombus. Normal compressibility and flow on color Doppler imaging. Femoral Vein: No evidence of thrombus. Normal compressibility, respiratory phasicity and response to augmentation. Popliteal Vein: No evidence of thrombus. Normal compressibility, respiratory phasicity and response to augmentation. Calf Veins: No evidence of thrombus. Normal compressibility and flow on color Doppler imaging. Superficial Great Saphenous Vein: No evidence of thrombus. Normal compressibility and flow on color Doppler imaging. Venous Reflux:  None. Other Findings: There is a minimal amount of subcutaneous edema noted at the level of the left lower leg and calf. IMPRESSION: No evidence of DVT within the left lower extremity. Electronically Signed   By: Sandi Mariscal M.D.   On: 08/19/2015 18:03   Procedures Procedures (including critical care time)  Medications Ordered in ED Medications - No data to display   Initial Impression / Assessment and Plan / ED Course  I have reviewed the triage vital signs and the nursing notes.  Pertinent labs & imaging results that were available during my care of the patient were reviewed by me and considered in my medical decision making (see chart for details).  Clinical Course  Value Comment By Time  US Venous Img Lower Unilateral Left (Reviewed) Daleen Bo, MD 07/27 1822  US Venous Img Lower Unilateral Left (Reviewed) Daleen Bo, MD 07/27 1822  US Venous Img Lower Unilateral Left (Reviewed) Daleen Bo, MD 07/27 1823  US Venous Img Lower Unilateral Left (Reviewed) Daleen Bo, MD 07/27 1823    Medications - No data to display  Patient Vitals for the past 24 hrs:  BP Temp Temp src Pulse Resp SpO2 Height Weight  08/19/15 1800 162/80 - - 102 18 100 % - -  08/19/15 1549 152/83 98.1 F (36.7  C) Oral 116 18 99 % '5\' 1"'$  (1.549 m) 119 lb (54 kg)    6:28 PM Reevaluation with update and discussion. After initial assessment and treatment, an updated evaluation reveals She is comfortable and has no further complaints. Findings discussed with patient, all questions were answered. Lark Langenfeld L    Final Clinical Impressions(s) / ED Diagnoses   Final diagnoses:  Peripheral edema  Leg swelling   Peripheral edema related to inactivity, and postoperative status. No evidence for DVT, acute congestive heart failure or wound infection.  Nursing Notes Reviewed/ Care Coordinated Applicable Imaging Reviewed Interpretation of Laboratory Data incorporated into ED treatment  The patient appears reasonably screened and/or stabilized for discharge and I doubt any other medical condition or other [facility] requiring further screening, evaluation, or treatment in the ED at this time prior to discharge.  Plan: Home Medications- continue; Home Treatments- rest; return here if the recommended treatment, does not improve the symptoms; Recommended follow up- PCP prn. Ortho next week as scheduled.   New Prescriptions New Prescriptions   No medications on file     Daleen Bo, MD 08/19/15 1840

## 2015-08-19 NOTE — ED Notes (Signed)
Dressings replaced on left leg incision sites. Staple intact. Wounds well approximated, no noted drainage on previous bandages. No s/s of infection.

## 2015-08-19 NOTE — Telephone Encounter (Signed)
Reviewed Med list w/ Megan Sims's daughter in law.  She says Dr. Alvy Bimler had instructed Megan Sims to stop Amlodipine.   So Megan Sims should be on Prednisone 20 mg daily,  Prilosec and Carafate as prescribed.  She verbalized understanding and states Megan Sims has about 2 weeks left of prednisone but does not see Dr. Alvy Bimler again til end of August.  Refill sent to her pharmacy.   Also instructed daughter in law to call Megan Sims's PCP,  Dr. Maudie Mercury, to make appt to assess the swelling in Megan Sims's legs.  She says swelling was more in same leg as hip surgery but now both legs are about equally swollen.  Also instructed to Have Megan Sims keep legs elevated as much as possible.  Dtr in law will call Dr. Julianne Rice office for appt..  Megan Sims has appt to f/u w/ Surgeon next week.

## 2015-08-19 NOTE — ED Notes (Signed)
Patient with no complaints at this time. Respirations even and unlabored. Skin warm/dry. Discharge instructions reviewed with patient at this time. Patient given opportunity to voice concerns/ask questions. Patient discharged at this time and left Emergency Department via wheelchair. 

## 2015-08-19 NOTE — Telephone Encounter (Signed)
Pt's dtr in law Left VM reporting pt's BP is 174/98 and asks if Dr. Alvy Bimler thinks it would be ok for pt to resume her Amlodipine?   Called back to tell her that Dr. Alvy Bimler is ok w/ Amlodipine. Pt's son answered and informed pt actually in ED now.  They brought pt to ED because her legs and feet are swollen.  Son says he will call us back tomorrow to let Dr. Alvy Bimler know what happened.

## 2015-08-19 NOTE — ED Notes (Signed)
Assisted pt with BSC. No distress.

## 2015-08-27 ENCOUNTER — Encounter (HOSPITAL_COMMUNITY): Payer: Self-pay

## 2015-08-27 ENCOUNTER — Emergency Department (HOSPITAL_COMMUNITY): Payer: Medicaid Other

## 2015-08-27 ENCOUNTER — Emergency Department (HOSPITAL_COMMUNITY)
Admission: EM | Admit: 2015-08-27 | Discharge: 2015-08-27 | Disposition: A | Discharge status: Home / Self Care | Payer: Medicaid Other | Attending: Emergency Medicine | Admitting: Emergency Medicine

## 2015-08-27 DIAGNOSIS — R0602 Shortness of breath: Secondary | ICD-10-CM | POA: Diagnosis not present

## 2015-08-27 DIAGNOSIS — R002 Palpitations: Secondary | ICD-10-CM

## 2015-08-27 DIAGNOSIS — R000 Tachycardia, unspecified: Secondary | ICD-10-CM | POA: Insufficient documentation

## 2015-08-27 DIAGNOSIS — I10 Essential (primary) hypertension: Secondary | ICD-10-CM | POA: Diagnosis not present

## 2015-08-27 DIAGNOSIS — R05 Cough: Secondary | ICD-10-CM | POA: Diagnosis not present

## 2015-08-27 DIAGNOSIS — Z87891 Personal history of nicotine dependence: Secondary | ICD-10-CM | POA: Insufficient documentation

## 2015-08-27 DIAGNOSIS — Z79899 Other long term (current) drug therapy: Secondary | ICD-10-CM | POA: Diagnosis not present

## 2015-08-27 DIAGNOSIS — R079 Chest pain, unspecified: Secondary | ICD-10-CM | POA: Diagnosis not present

## 2015-08-27 DIAGNOSIS — R21 Rash and other nonspecific skin eruption: Secondary | ICD-10-CM | POA: Diagnosis not present

## 2015-08-27 DIAGNOSIS — R109 Unspecified abdominal pain: Secondary | ICD-10-CM | POA: Diagnosis not present

## 2015-08-27 DIAGNOSIS — R197 Diarrhea, unspecified: Secondary | ICD-10-CM | POA: Insufficient documentation

## 2015-08-27 DIAGNOSIS — M7989 Other specified soft tissue disorders: Secondary | ICD-10-CM | POA: Diagnosis not present

## 2015-08-27 LAB — URINE MICROSCOPIC-ADD ON
Squamous Epithelial / LPF: NONE SEEN
WBC, UA: NONE SEEN WBC/hpf (ref 0–5)

## 2015-08-27 LAB — CBC WITH DIFFERENTIAL/PLATELET
BASOS ABS: 0 10*3/uL (ref 0.0–0.1)
BASOS PCT: 0 %
EOS ABS: 0.1 10*3/uL (ref 0.0–0.7)
EOS PCT: 1 %
HEMATOCRIT: 29.1 % — AB (ref 36.0–46.0)
Hemoglobin: 9.5 g/dL — ABNORMAL LOW (ref 12.0–15.0)
Lymphocytes Relative: 6 %
Lymphs Abs: 0.5 10*3/uL — ABNORMAL LOW (ref 0.7–4.0)
MCH: 31.4 pg (ref 26.0–34.0)
MCHC: 32.6 g/dL (ref 30.0–36.0)
MCV: 96 fL (ref 78.0–100.0)
MONO ABS: 1.4 10*3/uL — AB (ref 0.1–1.0)
MONOS PCT: 18 %
NEUTROS ABS: 5.9 10*3/uL (ref 1.7–7.7)
Neutrophils Relative %: 75 %
PLATELETS: 159 10*3/uL (ref 150–400)
RBC: 3.03 MIL/uL — ABNORMAL LOW (ref 3.87–5.11)
RDW: 18.2 % — AB (ref 11.5–15.5)
WBC: 7.8 10*3/uL (ref 4.0–10.5)

## 2015-08-27 LAB — COMPREHENSIVE METABOLIC PANEL
ALBUMIN: 3.2 g/dL — AB (ref 3.5–5.0)
ALK PHOS: 69 U/L (ref 38–126)
ALT: 15 U/L (ref 14–54)
AST: 41 U/L (ref 15–41)
Anion gap: 6 (ref 5–15)
BILIRUBIN TOTAL: 0.9 mg/dL (ref 0.3–1.2)
BUN: 25 mg/dL — AB (ref 6–20)
CALCIUM: 8.6 mg/dL — AB (ref 8.9–10.3)
CO2: 27 mmol/L (ref 22–32)
CREATININE: 0.62 mg/dL (ref 0.44–1.00)
Chloride: 106 mmol/L (ref 101–111)
GFR calc Af Amer: >60 mL/min (ref >60)
GFR calc non Af Amer: >60 mL/min (ref >60)
GLUCOSE: 87 mg/dL (ref 65–99)
Potassium: 3.4 mmol/L — ABNORMAL LOW (ref 3.5–5.1)
SODIUM: 139 mmol/L (ref 135–145)
TOTAL PROTEIN: 6.1 g/dL — AB (ref 6.5–8.1)

## 2015-08-27 LAB — URINALYSIS, ROUTINE W REFLEX MICROSCOPIC
BILIRUBIN URINE: NEGATIVE
Glucose, UA: NEGATIVE mg/dL
KETONES UR: NEGATIVE mg/dL
Leukocytes, UA: NEGATIVE
NITRITE: NEGATIVE
PH: 7.5 (ref 5.0–8.0)
PROTEIN: NEGATIVE mg/dL
Specific Gravity, Urine: 1.01 (ref 1.005–1.030)

## 2015-08-27 LAB — TROPONIN I
TROPONIN I: 0.03 ng/mL — AB (ref <0.03)
TROPONIN I: 0.04 ng/mL — AB (ref <0.03)
TROPONIN I: 0.04 ng/mL — AB (ref <0.03)

## 2015-08-27 LAB — LIPASE, BLOOD: Lipase: 24 U/L (ref 11–51)

## 2015-08-27 LAB — D-DIMER, QUANTITATIVE: D-Dimer, Quant: 9.87 ug/mL-FEU — ABNORMAL HIGH (ref 0.00–0.50)

## 2015-08-27 MED ORDER — IOPAMIDOL (ISOVUE-370) INJECTION 76%
100.0000 mL | Freq: Once | INTRAVENOUS | Status: AC | PRN
  Administered 2015-08-27: 100 mL via INTRAVENOUS

## 2015-08-27 MED ORDER — HEPARIN SOD (PORK) LOCK FLUSH 100 UNIT/ML IV SOLN
500.0000 [IU] | Freq: Once | INTRAVENOUS | Status: AC
  Administered 2015-08-27: 500 [IU]
  Filled 2015-08-27: qty 5

## 2015-08-27 MED ORDER — SODIUM CHLORIDE 0.9 % IV SOLN
INTRAVENOUS | Status: DC
  Administered 2015-08-27: 10:00:00 via INTRAVENOUS

## 2015-08-27 MED ORDER — SODIUM CHLORIDE 0.9 % IV BOLUS (SEPSIS)
250.0000 mL | Freq: Once | INTRAVENOUS | Status: AC
  Administered 2015-08-27: 250 mL via INTRAVENOUS

## 2015-08-27 NOTE — ED Notes (Signed)
CRITICAL VALUE ALERT  Critical value received:  Troponin 0.03  Date of notification:  08/27/2015  Time of notification:  Now  Critical value read back:: Yes  Nurse who received alert:  [employer]

## 2015-08-27 NOTE — ED Provider Notes (Signed)
Blood pressure 158/92, pulse 98, temperature 97.5 F (36.4 C), temperature source Oral, resp. rate 20, height '5\' 1"'$  (1.549 m), weight 120 lb (54.4 kg), SpO2 100 %.  Assuming care from Dr. Rogene Houston.  In short, Megan Sims is a 60 y.o. female with a chief complaint of Tachycardia .  Refer to the original H&P for additional details.  The current plan of care is to follow 3rd troponin and CTA chest to evaluate for PE.  06:17 PM CT resulted with limited study but no obvious pulmonary embolism. I discussed this with the patient. I also discussed the finding of the Reece size and changing shape of the right upper lobe infiltrate. Patient with no active signs of infection. She has follow-up with her physician and he manages her lung cancer in late August. I advised that she call on Monday to discuss the change in her scan since May 2017 and asked if they would like to see her sooner. Discussed that this is not definitely a worsening neoplastic lesion but she should discuss further with her provider.   At this time, I do not feel there is any life-threatening condition present. I have reviewed and discussed all results (EKG, imaging, lab, urine as appropriate), exam findings with patient. I have reviewed nursing notes and appropriate previous records.  I feel the patient is safe to be discharged home without further emergent workup. Discussed usual and customary return precautions. Patient and family (if present) verbalize understanding and are comfortable with this plan.  Patient will follow-up with their primary care provider. If they do not have a primary care provider, information for follow-up has been provided to them. All questions have been answered.   Megan Quinton, MD   Megan Fast, MD 08/27/15 [phone]

## 2015-08-27 NOTE — ED Notes (Signed)
Patient states that she has to take medication through her peg tube.

## 2015-08-27 NOTE — ED Notes (Signed)
Patient transported to CT 

## 2015-08-27 NOTE — ED Notes (Signed)
Patient placed on bedpan to void.

## 2015-08-27 NOTE — ED Notes (Signed)
CRITICAL VALUE ALERT   Critical value received:  Troponin 0.04  Date of notification: 08/27/2015  Time of notification:  1338  Critical value read back: Yes  Nurse who received alert:  Hinton Rao, RN   MD notified (1st page):  Dr. Rogene Houston

## 2015-08-27 NOTE — ED Triage Notes (Signed)
Patient states she got up to go to the bathroom around 0730 this morning and she noticed her heart was beating fast. States she lay back down but her heart continued to beat fast. Denies chest pain at any time but does complain of a headache

## 2015-08-27 NOTE — ED Provider Notes (Signed)
[facility] DEPT Provider Note   CSN: [id number] Arrival date & time: 08/27/15  7893  First Provider Contact:  First MD Initiated Contact with Patient 08/27/15 [phone]   By signing my name below, I, Georgette Shell, attest that this documentation has been prepared under the direction and in the presence of Fredia Sorrow, MD. Electronically Signed: Georgette Shell, ED Scribe. 08/27/15. 9:24 AM.  History   Chief Complaint Chief Complaint  Patient presents with  . Tachycardia   HPI Comments: Megan Sims is a 60 y.o. female with h/o stage 3 lung cancer, HTN, and migraines who presents to the Emergency Department by EMS complaining of sudden onset, constant, sensation of heart palpations onset 7:30 am this morning. Pt got up to use the bathroom and noticed her heart was beating fast. Pt also has associated 5/10 headache and productive cough. She notes she had a "weird feeling". Pt states she went to lay back down but her heart rate was still fast. Pt states she has a home nurse that started today. She reports normal appetite. Pt has had mild intermittent chest pain onset one day ago. Pt did not try to alleviate the pain PTA. Pt is not on oxygen at home. She is not on blood thinners. Pt is not on chemotherapy at this time. Pt denies fever, congestion, rhinorrhea, sore throat, visual disturbance, hematochezia, nausea, vomiting, dysuria, hematuria, back pain, neck pain, and bruising easily.  The history is provided by the patient. No language interpreter was used.    Past Medical History:  Diagnosis Date  . Cancer [facility])    malignant neoplasm of unknown origin  . Cerebral aneurysm 2 brain surgeries 96 or 97  . Chronic neck pain 09/24/2014  . Diarrhea   . Emphysema of lung ([place]) 05/05/2015  . Hypertension   . Left knee pain   . Left leg weakness 06/2015  . Lung abnormality   . Malignant neoplasm of unknown origin ([place])   . Migraine   . Nicotine dependence 09/24/2014  . Oral thrush 09/24/2014  . Paget disease  of bone   . Pleurisy   . Renal insufficiency    Patient states " no kidney problems  . S/P biopsy    of throat per patient.  . Skin ulcer ([place]) 11/18/2014    Patient Active Problem List   Diagnosis Date Noted  . Closed fracture of left femur ([place])   . Bleeding   . Acute blood loss anemia   . Fall from slip, trip, or stumble 08/07/2015  . Left femoral shaft fracture ([place]) 08/07/2015  . 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 S/P gastrostomy ([place]) 08/04/2015  . Deficiency anemia 08/03/2015  . Dehydration 07/02/2015  . Candida esophagitis ([place])   . Anemia due to other cause   . Mucosal abnormality of stomach   . Pill esophagitis due to tetracycline 05/24/2015  . UTI (lower urinary tract infection) 05/24/2015  . Emphysema of lung ([place]) 05/05/2015  . Rash, skin 05/05/2015  . Severe sepsis ([place]) 04/10/2015  . AKI (acute kidney injury) ([place]) 04/10/2015  . Hyponatremia 04/10/2015  . Sepsis due to pneumonia ([place])   . CAP (community acquired pneumonia) 04/09/2015  . Skin ulcer ([place]) 11/18/2014  . Musculoskeletal chest pain 10/25/2014  . Pain in the chest   . Tachycardia   . Anemia due to antineoplastic chemotherapy 10/21/2014  . Cancer of upper lobe of right lung ([place]) 10/07/2014  . Oral thrush 09/24/2014  . Nicotine dependence 09/24/2014  .  Chronic neck pain 09/24/2014  . Excessive weight loss 09/24/2014  . 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 Bilateral lung cancer ([place])   . Paget's disease of bone   . Dysphagia 09/05/2014  . Essential hypertension 09/05/2014    Past Surgical History:  Procedure Laterality Date  . CEREBRAL ANEURYSM REPAIR  96 or 97  . CHEST TUBE INSERTION    . COLONOSCOPY     10 years ago in [place]   . ESOPHAGOGASTRODUODENOSCOPY  06/16/2015   Dr. Gala Romney: edentulous cricopharyngeus, esophageal plaques, biopsy consistent with candida  . ESOPHAGOGASTRODUODENOSCOPY N/A 06/16/2015   Procedure:  ESOPHAGOGASTRODUODENOSCOPY (EGD);  Surgeon: Daneil Dolin, MD;  Location: [facility] END[facility] SUITE;  Service: Endoscopy;  Laterality: N/A;  . ESOPHAGOGASTRODUODENOSCOPY N/A 07/06/2015   Procedure: ESOPHAGOGASTRODUODENOSCOPY (EGD);  Surgeon: Danie Binder, MD;  Location: [facility] ENDO SUITE;  Service: Endoscopy;  Laterality: N/A;  . INTRAMEDULLARY (IM) NAIL INTERTROCHANTERIC Left 08/07/2015   Procedure: INTRAMEDULLARY (IM) NAIL INTERTROCHANTRIC;  Surgeon: Melrose Nakayama, MD;  Location: [facility] ORS;  Service: Orthopedics;  Laterality: Left;  . LARYNGOSCOPY  Jun 22, 2015   [facility], Dr. Erik Obey: normal oropharynx, no lesions, mobile vocal cords with good airway.  . PEG PLACEMENT N/A 07/06/2015   Procedure: PERCUTANEOUS ENDOSCOPIC GASTROSTOMY (PEG) PLACEMENT;  Surgeon: Danie Binder, MD;  Location: [facility];  Service: Endoscopy;  Laterality: N/A;  . TUBAL LIGATION      OB History    Gravida Para Term Preterm AB Living             3   SAB TAB Ectopic Multiple Live Births                   Home Medications    Prior to Admission medications   Medication Sig Start Date End Date Taking? Authorizing Provider  HYDROcodone-acetaminophen (HYCET) 7.5-325 mg/15 ml solution Take 15 mLs by mouth every 6 (six) hours as needed. 07/28/15  Yes Historical Provider, MD  Nutritional Supplements (FEEDING SUPPLEMENT, JEVITY 1.2 CAL,) LIQD Place 237 mLs into feeding tube every 3 (three) hours. 07/08/15  Yes Rosita Fire, MD  omeprazole (PRILOSEC) 20 MG capsule Liquid Form 2 mg/ml.  Take 10 ml (20 mg) twice daily. Patient taking differently: Take 20 mg by mouth daily. Liquid Form 2 mg/ml.  Take 10 ml (20 mg) twice daily. 08/06/15  Yes Heath Lark, MD  predniSONE (DELTASONE) 20 MG tablet Take 1 tablet (20 mg total) by mouth daily with breakfast. 08/19/15  Yes Heath Lark, MD  zolpidem (AMBIEN) 10 MG tablet Take 10 mg by mouth at bedtime as needed for sleep.   Yes Historical Provider, MD  HYDROcodone-acetaminophen (NORCO/VICODIN) 5-325 MG  tablet Take 1-2 tablets by mouth every 6 (six) hours as needed for moderate pain. Patient not taking: Reported on 08/27/2015 08/16/15   Tiffany L Reed, DO  sucralfate (CARAFATE) 1 GM/10ML suspension Take 10 mLs (1 g total) by mouth 4 (four) times daily -  with meals and at bedtime. Patient not taking: Reported on 08/27/2015 08/06/15   Heath Lark, MD    Family History Family History  Problem Relation Age of Onset  . Hypertension    . Diabetes    . Kidney disease    . Cancer Mother     throat ca  . Cancer Maternal Grandmother     thyroid ca  . Colon cancer Neg Hx     Social History Social History  Substance Use Topics  . Smoking status: Former Smoker  Packs/day: 0.50    Years: 34.00    Types: Cigarettes  . Smokeless tobacco: Never Used     Comment: HOWEVER, will smoke 1 cigarette if "tragic" event (death), or if she needs it for her nerves   . Alcohol use No     Allergies   Review of patient's allergies indicates no known allergies.   Review of Systems Review of Systems  Constitutional: Negative for fever.  HENT: Negative for congestion, rhinorrhea and sore throat.   Eyes: Negative for visual disturbance.  Respiratory: Positive for cough and shortness of breath.   Cardiovascular: Positive for chest pain, palpitations and leg swelling.  Gastrointestinal: Positive for abdominal pain and diarrhea. Negative for nausea and vomiting.  Genitourinary: Negative for dysuria and hematuria.  Musculoskeletal: Negative for back pain and neck pain.  Skin: Positive for rash and wound.  Neurological: Positive for headaches.  Hematological: Does not bruise/bleed easily.  Psychiatric/Behavioral: Negative for confusion.     Physical Exam Updated Vital Signs BP 145/79   Pulse 100   Temp 97.5 F (36.4 C) (Oral)   Resp (!) 31   Ht '5\' 1"'$  (1.549 m)   Wt 54.4 kg   SpO2 99%   BMI 22.67 kg/m   Physical Exam  Constitutional: She is oriented to person, place, and time. She appears  well-developed and well-nourished.  HENT:  Head: Normocephalic.  Mucous membranes slightly dry  Eyes: Conjunctivae and EOM are normal. Pupils are equal, round, and reactive to light.  Cardiovascular: Regular rhythm.  Tachycardia present.   Slightly tachycardic.  Pulmonary/Chest: Effort normal and breath sounds normal. No respiratory distress. She has no wheezes. She has no rales.  Lungs clear bilaterally. Access port in left anterior chest.  Abdominal: She exhibits no distension.  Musculoskeletal: Normal range of motion.  Neurological: She is alert and oriented to person, place, and time. No cranial nerve deficit. She exhibits normal muscle tone. Coordination normal.  Skin: Skin is warm and dry.  Psychiatric: She has a normal mood and affect. Her behavior is normal.  Nursing note and vitals reviewed.  ED Treatments / Results  DIAGNOSTIC STUDIES: Oxygen Saturation is 100% on [place], normal by my interpretation.    COORDINATION OF CARE: 9:20 AM Discussed treatment plan with pt at bedside which includes IVF and cardiac monitoring and pt agreed to plan.  Labs (all labs ordered are listed, but only abnormal results are displayed) Labs Reviewed  COMPREHENSIVE METABOLIC PANEL - Abnormal; Notable for the following:       Result Value   Potassium 3.4 (*)    BUN 25 (*)    Calcium 8.6 (*)    Total Protein 6.1 (*)    Albumin 3.2 (*)    All other components within normal limits  CBC WITH DIFFERENTIAL/PLATELET - Abnormal; Notable for the following:    RBC 3.03 (*)    Hemoglobin 9.5 (*)    HCT 29.1 (*)    RDW 18.2 (*)    Lymphs Abs 0.5 (*)    Monocytes Absolute 1.4 (*)    All other components within normal limits  URINALYSIS, ROUTINE W REFLEX MICROSCOPIC (NOT AT [facility]) - Abnormal; Notable for the following:    Hgb urine dipstick MODERATE (*)    All other components within normal limits  TROPONIN I - Abnormal; Notable for the following:    Troponin I 0.03 (*)    All other components within  normal limits  URINE MICROSCOPIC-ADD ON - Abnormal; Notable for the following:  Bacteria, UA FEW (*)    All other components within normal limits  TROPONIN I - Abnormal; Notable for the following:    Troponin I 0.04 (*)    All other components within normal limits  D-DIMER, QUANTITATIVE (NOT AT [facility]) - Abnormal; Notable for the following:    D-Dimer, Quant 9.87 (*)    All other components within normal limits  URINE CULTURE  LIPASE, BLOOD  TROPONIN I   Results for orders placed or performed during the hospital encounter of 08/27/15  Comprehensive metabolic panel  Result Value Ref Range   Sodium 139 135 - 145 mmol/L   Potassium 3.4 (L) 3.5 - 5.1 mmol/L   Chloride 106 101 - 111 mmol/L   CO2 27 22 - 32 mmol/L   Glucose, Bld 87 65 - 99 mg/dL   BUN 25 (H) 6 - 20 mg/dL   Creatinine, Ser 0.62 0.44 - 1.00 mg/dL   Calcium 8.6 (L) 8.9 - 10.3 mg/dL   Total Protein 6.1 (L) 6.5 - 8.1 g/dL   Albumin 3.2 (L) 3.5 - 5.0 g/dL   AST 41 15 - 41 U/L   ALT 15 14 - 54 U/L   Alkaline Phosphatase 69 38 - 126 U/L   Total Bilirubin 0.9 0.3 - 1.2 mg/dL   GFR calc non Af Amer >60 >60 mL/min   GFR calc Af Amer >60 >60 mL/min   Anion gap 6 5 - 15  CBC with Differential/Platelet  Result Value Ref Range   WBC 7.8 4.0 - 10.5 K/uL   RBC 3.03 (L) 3.87 - 5.11 MIL/uL   Hemoglobin 9.5 (L) 12.0 - 15.0 g/dL   HCT 29.1 (L) 36.0 - 46.0 %   MCV 96.0 78.0 - 100.0 fL   MCH 31.4 26.0 - 34.0 pg   MCHC 32.6 30.0 - 36.0 g/dL   RDW 18.2 (H) 11.5 - 15.5 %   Platelets 159 150 - 400 K/uL   Neutrophils Relative % 75 %   Neutro Abs 5.9 1.7 - 7.7 K/uL   Lymphocytes Relative 6 %   Lymphs Abs 0.5 (L) 0.7 - 4.0 K/uL   Monocytes Relative 18 %   Monocytes Absolute 1.4 (H) 0.1 - 1.0 K/uL   Eosinophils Relative 1 %   Eosinophils Absolute 0.1 0.0 - 0.7 K/uL   Basophils Relative 0 %   Basophils Absolute 0.0 0.0 - 0.1 K/uL  Urinalysis, Routine w reflex microscopic (not at [facility])  Result Value Ref Range   Color, Urine YELLOW  YELLOW   APPearance CLEAR CLEAR   Specific Gravity, Urine 1.010 1.005 - 1.030   pH 7.5 5.0 - 8.0   Glucose, UA NEGATIVE NEGATIVE mg/dL   Hgb urine dipstick MODERATE (A) NEGATIVE   Bilirubin Urine NEGATIVE NEGATIVE   Ketones, ur NEGATIVE NEGATIVE mg/dL   Protein, ur NEGATIVE NEGATIVE mg/dL   Nitrite NEGATIVE NEGATIVE   Leukocytes, UA NEGATIVE NEGATIVE  Lipase, blood  Result Value Ref Range   Lipase 24 11 - 51 U/L  Troponin I  Result Value Ref Range   Troponin I 0.03 (HH) <0.03 ng/mL  Urine microscopic-add on  Result Value Ref Range   Squamous Epithelial / LPF NONE SEEN NONE SEEN   WBC, UA NONE SEEN 0 - 5 WBC/hpf   RBC / HPF 6-30 0 - 5 RBC/hpf   Bacteria, UA FEW (A) NONE SEEN  Troponin I  Result Value Ref Range   Troponin I 0.04 (HH) <0.03 ng/mL  D-dimer, quantitative (not at [facility])  Result Value Ref Range   D-Dimer, Quant 9.87 (H) 0.00 - 0.50 ug/mL-FEU     EKG  EKG Interpretation  Date/Time:  Friday August 27 2015 08:48:28 EDT Ventricular Rate:  127 PR Interval:    QRS Duration: 73 QT Interval:  315 QTC Calculation: 458 R Axis:   36 Text Interpretation:  Sinus tachycardia Anteroseptal infarct, old increased tachycardia  Otherwise no significant change Confirmed by Rogene [place]  MD, Jermone Geister [phone]) on 08/27/2015 8:59:50 AM       Radiology Dg Chest 2 View  Result Date: 08/27/2015 CLINICAL DATA:  Productive cough.  History of lung carcinoma EXAM: CHEST  2 VIEW COMPARISON:  August 09, 2015 FINDINGS: Port-A-Cath tip is in the superior vena cava. No pneumothorax. Scarring in the right mid lung is stable. Apparent nipple shadow on the left is stable. There is no frank edema or consolidation. No new opacity. Heart size and pulmonary vascularity are normal. No adenopathy. There is atherosclerotic calcification in the aorta. No bone lesions. IMPRESSION: Scarring right mid lung. No edema or consolidation. No adenopathy evident by radiography. Nipple shadow on the left. Port-A-Cath tip in  superior vena cava without pneumothorax. There is aortic atherosclerosis. Electronically Signed   By: Lowella Grip III M.D.   On: 08/27/2015 10:37    Procedures Procedures (including critical care time)  Medications Ordered in ED Medications  0.9 %  sodium chloride infusion ( Intravenous New Bag/Given 08/27/15 0932)  sodium chloride 0.9 % bolus 250 mL (0 mLs Intravenous Stopped 08/27/15 1350)     Initial Impression / Assessment and Plan / ED Course  I have reviewed the triage vital signs and the nursing notes.  Pertinent labs & imaging results that were available during my care of the patient were reviewed by me and considered in my medical decision making (see chart for details).  Clinical Course   Patient with history of lung cancer. Came in for rapid heart rate. Shortly after arrival with quickly settled down and was normal. Upper 90s to 100. Patient's previous visit shown that her heart rate is usually in this range. Was as high as the 125 shortly after arrival but then resolved. Patient is followed by [place] [facility] long. Based on this patient is risk for pulmonary embolus. Patient also talked about some intermittent brief chest pain on and off overnight. Patient's first troponin was slightly abnormal at 0.03. Was repeated at 3 hour mark and that was 0.04. Based on these troponins discussed with internal medicine but they felt that it wasn't strong enough for admission at this time. Patient's EKG without any acute changes. Patient's chest x-ray without any acute abnormalities. However patient's d-dimer was markedly elevated. Patient will undergo CT angios. If that's positive for pulmonary embolus patient will require admission. If negative patient will undergo a third troponin 3 hours from the second one. If that is increasing significantly patient will require admission if it's remaining quite low like the other 2 patient is safe for discharge home. Patient understands the game  plan. Patient did receive hydration here and overall patient feels much better despite the lab abnormalities.   Final Clinical Impressions(s) / ED Diagnoses   Final diagnoses:  Palpitations    New Prescriptions New Prescriptions   No medications on file  I personally performed the services described in this documentation, which was scribed in my presence. The recorded information has been reviewed and is accurate.       Fredia Sorrow, MD 08/27/15 [phone]

## 2015-08-27 NOTE — Discharge Instructions (Signed)
You have been seen in the Emergency Department (ED) today for chest pain.  As we have discussed today?s test results are normal, but you may require further testing.  The lung lesion on the right upper lobe was found to be slightly larger compared to the May 2017 CT scan. You should discuss this with your outpatient lung cancer provider on Monday to advise further follow up or evaluation.   Please follow up with the recommended doctor as instructed above in these documents regarding today?s emergent visit and your recent symptoms to discuss further management.  Continue to take your regular medications. If you are not doing so already, please also take a daily baby aspirin (81 mg), at least until you follow up with your doctor.  Return to the Emergency Department (ED) if you experience any further chest pain/pressure/tightness, difficulty breathing, or sudden sweating, or other symptoms that concern you.   Chest Pain (Nonspecific) It is often hard to give a specific diagnosis for the cause of chest pain. There is always a chance that your pain could be related to something serious, such as a heart attack or a blood clot in the lungs. You need to follow up with your health care provider for further evaluation. CAUSES  Heartburn. Pneumonia or bronchitis. Anxiety or stress. Inflammation around your heart (pericarditis) or lung (pleuritis or pleurisy). A blood clot in the lung. A collapsed lung (pneumothorax). It can develop suddenly on its own (spontaneous pneumothorax) or from trauma to the chest. Shingles infection (herpes zoster virus). The chest wall is composed of bones, muscles, and cartilage. Any of these can be the source of the pain. The bones can be bruised by injury. The muscles or cartilage can be strained by coughing or overwork. The cartilage can be affected by inflammation and become sore (costochondritis). DIAGNOSIS  Lab tests or other studies may be needed to find the cause of  your pain. Your health care provider may have you take a test called an ambulatory electrocardiogram (ECG). An ECG records your heartbeat patterns over a 24-hour period. You may also have other tests, such as: Transthoracic echocardiogram (TTE). During echocardiography, sound waves are used to evaluate how blood flows through your heart. Transesophageal echocardiogram (TEE). Cardiac monitoring. This allows your health care provider to monitor your heart rate and rhythm in real time. Holter monitor. This is a portable device that records your heartbeat and can help diagnose heart arrhythmias. It allows your health care provider to track your heart activity for several days, if needed. Stress tests by exercise or by giving medicine that makes the heart beat faster. TREATMENT  Treatment depends on what may be causing your chest pain. Treatment may include: Acid blockers for heartburn. Anti-inflammatory medicine. Pain medicine for inflammatory conditions. Antibiotics if an infection is present. You may be advised to change lifestyle habits. This includes stopping smoking and avoiding alcohol, caffeine, and chocolate. You may be advised to keep your head raised (elevated) when sleeping. This reduces the chance of acid going backward from your stomach into your esophagus. Most of the time, nonspecific chest pain will improve within 2-3 days with rest and mild pain medicine.  HOME CARE INSTRUCTIONS  If antibiotics were prescribed, take them as directed. Finish them even if you start to feel better. For the next few days, avoid physical activities that bring on chest pain. Continue physical activities as directed. Do not use any tobacco products, including cigarettes, chewing tobacco, or electronic cigarettes. Avoid drinking alcohol. Only take medicine  as directed by your health care provider. Follow your health care provider's suggestions for further testing if your chest pain does not go away. Keep  any follow-up appointments you made. If you do not go to an appointment, you could develop lasting (chronic) problems with pain. If there is any problem keeping an appointment, call to reschedule. SEEK MEDICAL CARE IF:  Your chest pain does not go away, even after treatment. You have a rash with blisters on your chest. You have a fever. SEEK IMMEDIATE MEDICAL CARE IF:  You have increased chest pain or pain that spreads to your arm, neck, jaw, back, or abdomen. You have shortness of breath. You have an increasing cough, or you cough up blood. You have severe back or abdominal pain. You feel nauseous or vomit. You have severe weakness. You faint. You have chills. This is an emergency. Do not wait to see if the pain will go away. Get medical help at once. Call your local emergency services (911 in [place] Do not drive yourself to the hospital. MAKE SURE YOU:  Understand these instructions. Will watch your condition. Will get help right away if you are not doing well or get worse. Document Released: 10/19/2004 Document Revised: 01/14/2013 Document Reviewed: 08/15/2007 [facility] Patient Information 2015 [place], [place]. This information is not intended to replace advice given to you by your health care provider. Make sure you discuss any questions you have with your health care provider.

## 2015-08-28 LAB — URINE CULTURE

## 2015-08-30 ENCOUNTER — Other Ambulatory Visit: Payer: Self-pay | Admitting: Hematology and Oncology

## 2015-08-30 ENCOUNTER — Telehealth: Payer: Self-pay | Admitting: *Deleted

## 2015-08-30 DIAGNOSIS — C3491 Malignant neoplasm of unspecified part of right bronchus or lung: Secondary | ICD-10-CM

## 2015-08-30 DIAGNOSIS — C3492 Malignant neoplasm of unspecified part of left bronchus or lung: Principal | ICD-10-CM

## 2015-08-30 MED ORDER — AMLODIPINE BESYLATE 10 MG PO TABS: 10.0000 mg | ORAL_TABLET | Freq: Every day | ORAL | Status: AC

## 2015-08-30 NOTE — Telephone Encounter (Signed)
Returned call to pt's son and informed him of Dr. Calton Dach reply regarding CT scan.  Instructed to take pt for CXR prior to appt on 8/30.  Also instructed to resume Amlodipine.   He verbalized understanding.

## 2015-08-30 NOTE — Telephone Encounter (Signed)
Looks like inflammation I would just watch it and repeat CXR when I see her on 8/30

## 2015-08-30 NOTE — Telephone Encounter (Signed)
Daughter- in- law left VM informing pt seen in ED on Friday for elevated heart rate.  Pt had CT scan done and ED Physician instructed pt to ask Dr. Alvy Bimler to review CT scan for some "lymph nodes" in pt's right lung.  She asks if Dr. Alvy Bimler needs to see her about results of CT scan?  Pt also continues to have elevated BP 152/90 yesterday by [facility] RN.  They ask if pt should resume Amlodipine?   This RN had instructed pt's son previously to resume Amlodipine and will instruct again to resume when I call them back about CT Scan.

## 2015-08-31 ENCOUNTER — Telehealth: Payer: Self-pay | Admitting: *Deleted

## 2015-08-31 ENCOUNTER — Other Ambulatory Visit: Payer: Self-pay | Admitting: Hematology and Oncology

## 2015-08-31 DIAGNOSIS — C3491 Malignant neoplasm of unspecified part of right bronchus or lung: Secondary | ICD-10-CM

## 2015-08-31 DIAGNOSIS — C3492 Malignant neoplasm of unspecified part of left bronchus or lung: Principal | ICD-10-CM

## 2015-08-31 MED ORDER — MORPHINE SULFATE (CONCENTRATE) 20 MG/ML PO SOLN: 10.0000 mg | ORAL | 0 refills | Status: DC | PRN

## 2015-08-31 NOTE — Telephone Encounter (Signed)
Daughter called to say patient needs a refill of liquid pain medicine. Please call when ready

## 2015-08-31 NOTE — Telephone Encounter (Signed)
I only saw Liquid Hycet I typically do not refill Hycet so someone must have written to prescription [facility] I order either Roxanol or Dilaudid If the patient or son insisted on Hycet, she has to get it from another provider I prefer Roxanol or Dilaudid due to lack of tylenol in those preparation

## 2015-09-01 ENCOUNTER — Telehealth: Payer: Self-pay | Admitting: *Deleted

## 2015-09-01 NOTE — Telephone Encounter (Signed)
Pt's son called to ask if Dr. Alvy Bimler wants pt to resume her Amlodipine?  Her BP is 140/80 now at her doctor's office.  Son says pt at PCP now.  Instructed son to let PCP manage her BP medication as that is most appropriate.   He understands but thought since Dr. Alvy Bimler told them to hold it that they needed to check w/ her to resume.  Informed son that this nurse instructed his wife earlier this week to resume amlodipine and also informed him a few weeks ago the same.  So it is ok w/ Dr. Alvy Bimler to resume Amlodipine, but defer to pt's PCP and allow PCP to manage BP.  He verbalized understanding.   He also says they have not picked up Rx for liquid morphine yet here and are going to see if they can get Rx from PCP instead.  Informed Rx ready to pick up if pt needs it.  He verbalized understanding.

## 2015-09-02 ENCOUNTER — Encounter: Payer: Self-pay | Admitting: Gastroenterology

## 2015-09-02 ENCOUNTER — Other Ambulatory Visit: Payer: Self-pay

## 2015-09-02 ENCOUNTER — Other Ambulatory Visit: Payer: Self-pay | Admitting: Gastroenterology

## 2015-09-02 ENCOUNTER — Ambulatory Visit (INDEPENDENT_AMBULATORY_CARE_PROVIDER_SITE_OTHER): Payer: Medicaid Other | Admitting: Gastroenterology

## 2015-09-02 VITALS — BP 102/59 | HR 125 | Temp 97.7°F | Ht 61.0 in | Wt 114.8 lb

## 2015-09-02 DIAGNOSIS — R1310 Dysphagia, unspecified: Secondary | ICD-10-CM | POA: Diagnosis not present

## 2015-09-02 DIAGNOSIS — K2990 Gastroduodenitis, unspecified, without bleeding: Secondary | ICD-10-CM

## 2015-09-02 DIAGNOSIS — K2970 Gastritis, unspecified, without bleeding: Secondary | ICD-10-CM

## 2015-09-02 DIAGNOSIS — R197 Diarrhea, unspecified: Secondary | ICD-10-CM

## 2015-09-02 MED ORDER — OMEPRAZOLE 2 MG/ML ORAL SUSPENSION: 40.0000 mg | Freq: Every day | ORAL | 11 refills | Status: DC

## 2015-09-02 MED ORDER — ENSURE ENLIVE PO LIQD: 237.0000 mL | Freq: Four times a day (QID) | ORAL | 12 refills | Status: DC

## 2015-09-02 MED ORDER — ENSURE ENLIVE PO LIQD: 237.0000 mL | Freq: Four times a day (QID) | ORAL | Status: DC

## 2015-09-02 MED ORDER — OMEPRAZOLE 2 MG/ML ORAL SUSPENSION: 40.0000 mg | Freq: Every day | ORAL | Status: DC

## 2015-09-02 NOTE — Assessment & Plan Note (Signed)
NEW IN ONSET. TOLERATING JEVITY 1.2 PRIOR TO HOSPITAL ADMISSION.   CHECK FOR C DIFF.

## 2015-09-02 NOTE — Progress Notes (Signed)
ON RECALL  °

## 2015-09-02 NOTE — Progress Notes (Signed)
Subjective:    Patient ID: Megan Sims, female    DOB: [birth year], 60 y.o.   MRN: [id number]  Jani Gravel, MD  HPI NOT TAKING ANY PILLS BY MOUTH. OCCASIONALLY TAKES PO AND DOES NOT COUGH AFTERWARDS. TROUBLE WITH DIARRHEA AFTER TUBE FEEDS. FEET STAY COLD/SWEATY. CONCERNED ABOUT BLACK STUFF AROUND TUBE YESTERDAY. COUGHS UP PHLEGM OFTEN. BMs: 1-4(WATERY/LOOSE). MILD ABDOMINAL PAIN:  UPPER CENTER ~3-4 DAYS AGO, A LITTLE TWINGE.   PT DENIES FEVER, CHILLS, HEMATOCHEZIA, HEMATEMESIS, nausea, vomiting, melena, CHEST PAIN, SHORTNESS OF BREATH,  CHANGE IN BOWEL IN HABITS, constipation, problems swallowing, OR heartburn or indigestion.   Past Medical History:  Diagnosis Date  . Cancer [facility])    malignant neoplasm of unknown origin  . Cerebral aneurysm 2 brain surgeries 96 or 97  . Chronic neck pain 09/24/2014  . Diarrhea   . Emphysema of lung ([place]) 05/05/2015  . Hypertension   . Left knee pain   . Left leg weakness 06/2015  . Lung abnormality   . Malignant neoplasm of unknown origin ([place])   . Migraine   . Nicotine dependence 09/24/2014  . Oral thrush 09/24/2014  . Paget disease of bone   . Pleurisy   . Renal insufficiency    Patient states " no kidney problems  . S/P biopsy    of throat per patient.  . Skin ulcer ([place]) 11/18/2014   Past Surgical History:  Procedure Laterality Date  . CEREBRAL ANEURYSM REPAIR  96 or 97  . CHEST TUBE INSERTION    . COLONOSCOPY     10 years ago in [place]   . ESOPHAGOGASTRODUODENOSCOPY  06/16/2015   Dr. Gala Romney: edentulous cricopharyngeus, esophageal plaques, biopsy consistent with candida  . ESOPHAGOGASTRODUODENOSCOPY N/A 06/16/2015   Procedure: ESOPHAGOGASTRODUODENOSCOPY (EGD);  Surgeon: Daneil Dolin, MD;  [facility];  Service: Endoscopy;  Laterality: N/A;  . ESOPHAGOGASTRODUODENOSCOPY N/A 07/06/2015   Procedure: ESOPHAGOGASTRODUODENOSCOPY (EGD);  Surgeon: Danie Binder, MD;  Location[facility];  Service: Endoscopy;  Laterality: N/A;  .  INTRAMEDULLARY (IM) NAIL INTERTROCHANTERIC Left 08/07/2015   Procedure: INTRAMEDULLARY (IM) NAIL INTERTROCHANTRIC;  Surgeon: Melrose Nakayama, MD;  Location: [facility] ORS;  Service: Orthopedics;  Laterality: Left;  . LARYNGOSCOPY  Jun 22, 2015   [facility], Dr. Erik Obey: normal oropharynx, no lesions, mobile vocal cords with good airway.  . PEG PLACEMENT N/A 07/06/2015   Procedure: PERCUTANEOUS ENDOSCOPIC GASTROSTOMY (PEG) PLACEMENT;  Surgeon: Danie Binder, MD;  [facility];  Service: Endoscopy;  Laterality: N/A;  . TUBAL LIGATION      No Known Allergies  Current Outpatient Prescriptions  Medication Sig Dispense Refill  . amLODipine (NORVASC) 10 MG tablet Take 1 tablet (10 mg total) by mouth daily. (Patient taking differently: Take 5 mg by mouth daily. )    .  (HYCET) 7.5-325 mg/15 ml solution Take 15 mLs by mouth every 6 (six) hours as needed.    . Nutritional Supplements (FEEDING SUPPLEMENT, JEVITY 1.2 CAL,) LIQD Place 237 mLs into feeding tube every 3 (three) hours.     . DELTASONE 20 MG tablet Take 1 tablet (20 mg total) by mouth daily with breakfast.    . zolpidem (AMBIEN) 10 MG tablet Take 10 mg by mouth at bedtime as needed for sleep.    .      .      .      .       Review of Systems PER HPI OTHERWISE ALL SYSTEMS ARE NEGATIVE.  Objective:   Physical Exam  Constitutional: She is oriented to person, place, and time. She appears well-developed and well-nourished. No distress.  HENT:  Head: Normocephalic and atraumatic.  Mouth/Throat: Oropharynx is clear and moist. No oropharyngeal exudate.  Eyes: Pupils are equal, round, and reactive to light. No scleral icterus.  Neck: Normal range of motion. Neck supple.  Cardiovascular: Normal rate, regular rhythm and normal heart sounds.   Pulmonary/Chest: Effort normal and breath sounds normal. No respiratory distress.  Abdominal: Soft. Bowel sounds are normal. She exhibits no distension. There is no tenderness.  PEG TUBE SITE CLEAN.  DRY, SMALL AMOUNT OF DRIED DRAINAGE AROUND TUBE, NO ERYTHEMA OR PURULENT DRAINAGE. EXAM LIMITED-PT IN [facility]   Musculoskeletal: She exhibits no edema.  MOBILE ASSIST WITH A WHEEL CHAIR  Lymphadenopathy:    She has no cervical adenopathy.  Neurological: She is alert and oriented to person, place, and time.  NO  NEW FOCAL DEFICITS  Psychiatric:  FLAT AFFECT, NORMAL MOOD   Vitals reviewed.     Assessment & Plan:

## 2015-09-02 NOTE — Progress Notes (Signed)
CC'ED TO PCP 

## 2015-09-02 NOTE — Patient Instructions (Signed)
ENSURE OR BOOST FOUR TIMES A DAY. IF NOT COVERED, USE CARNATION INSTANT BREAKFAST  IN 1 CUP OF ALMOND MILK FOUR TIMES A DAY.  DISCUSS TUBE FEEDS BEING IN SUN WITH [facility].  SUBMIT STOOL SAMPLE TO CHECK FOR C DIFF.  SEE SPEECH PATHOLOGY TO ASSESS IF YOU CAN EAT.  PLEASE CALL WITH QUESTIONS OR CONCERNS.  FOLLOW UP IN 2 MOS.

## 2015-09-02 NOTE — Assessment & Plan Note (Signed)
CLINICALLY IMPROVED. WANTS TO EAT. WEIGHT STABLE 113-114 SINCE MAY 2017.  ENSURE OR BOOST FOUR TIMES A DAY. IF NOT COVERED, USE CARNATION INSTANT BREAKFAST  IN 1 CUP OF ALMOND MILK FOUR TIMES A DAY. DISCUSS TUBE FEEDS BEING IN SUN WITH [facility]. SUBMIT STOOL SAMPLE TO CHECK FOR C DIFF. SEE SPEECH PATHOLOGY TO ASSESS IF YOU CAN EAT. PLEASE CALL WITH QUESTIONS OR CONCERNS.  FOLLOW UP IN 2 MOS.

## 2015-09-03 ENCOUNTER — Telehealth: Payer: Self-pay

## 2015-09-03 NOTE — Telephone Encounter (Signed)
T/C from Rema Fendt from [facility], asking about the [place].  They need to know if the method to give should be bolus gravity or pump.   Please advise!

## 2015-09-03 NOTE — Telephone Encounter (Signed)
RECEIVED A CALL FROM [facility] [phone]. WANTED TO CLARIFY HOW JEVITY INFUSED. EXPLAINED I DID NOT GIVE ORIGINAL ORDER, BUT PT HAVING TROUBLE WITH FEEDS VIA JEVITY BECAUSE SHE IS OUT AT [facility]. PT TO BE RE-EVALUATED BY SPEECH NEXT WEEK. ADDED ENSURE QID DUE TO NOT MEETING CALORIC NEEDS/MISSING FEEDS WHILE OUT AT [facility].

## 2015-09-03 NOTE — Telephone Encounter (Signed)
Called TO DISCUSS CONCERNS. LEFT MESSAGE FOR HER TO CALL [phone] BECAUSE THEY ARE UNABLE TO GIVE OUT PHONE NUMBERS.

## 2015-09-06 NOTE — Telephone Encounter (Signed)
Noted  

## 2015-09-07 ENCOUNTER — Emergency Department (HOSPITAL_COMMUNITY)
Admission: EM | Admit: 2015-09-07 | Discharge: 2015-09-07 | Disposition: A | Discharge status: Home / Self Care | Payer: Medicaid Other | Source: Home / Self Care | Attending: Emergency Medicine | Admitting: Emergency Medicine

## 2015-09-07 ENCOUNTER — Encounter (HOSPITAL_COMMUNITY): Payer: Self-pay | Admitting: Emergency Medicine

## 2015-09-07 ENCOUNTER — Emergency Department (HOSPITAL_COMMUNITY): Payer: Medicaid Other

## 2015-09-07 DIAGNOSIS — E860 Dehydration: Secondary | ICD-10-CM | POA: Insufficient documentation

## 2015-09-07 DIAGNOSIS — Z79899 Other long term (current) drug therapy: Secondary | ICD-10-CM | POA: Insufficient documentation

## 2015-09-07 DIAGNOSIS — F17210 Nicotine dependence, cigarettes, uncomplicated: Secondary | ICD-10-CM | POA: Insufficient documentation

## 2015-09-07 DIAGNOSIS — Z85828 Personal history of other malignant neoplasm of skin: Secondary | ICD-10-CM | POA: Insufficient documentation

## 2015-09-07 DIAGNOSIS — I10 Essential (primary) hypertension: Secondary | ICD-10-CM | POA: Insufficient documentation

## 2015-09-07 LAB — CBC WITH DIFFERENTIAL/PLATELET
Basophils Absolute: 0 10*3/uL (ref 0.0–0.1)
Basophils Relative: 0 %
Eosinophils Absolute: 0 10*3/uL (ref 0.0–0.7)
Eosinophils Relative: 0 %
HCT: 31.2 % — ABNORMAL LOW (ref 36.0–46.0)
HEMOGLOBIN: 10 g/dL — AB (ref 12.0–15.0)
LYMPHS ABS: 0.4 10*3/uL — AB (ref 0.7–4.0)
LYMPHS PCT: 3 %
MCH: 30.6 pg (ref 26.0–34.0)
MCHC: 32.1 g/dL (ref 30.0–36.0)
MCV: 95.4 fL (ref 78.0–100.0)
Monocytes Absolute: 1.2 10*3/uL — ABNORMAL HIGH (ref 0.1–1.0)
Monocytes Relative: 11 %
NEUTROS PCT: 86 %
Neutro Abs: 9.6 10*3/uL — ABNORMAL HIGH (ref 1.7–7.7)
Platelets: 295 10*3/uL (ref 150–400)
RBC: 3.27 MIL/uL — AB (ref 3.87–5.11)
RDW: 18.2 % — ABNORMAL HIGH (ref 11.5–15.5)
WBC: 11.1 10*3/uL — AB (ref 4.0–10.5)

## 2015-09-07 LAB — COMPREHENSIVE METABOLIC PANEL
ALK PHOS: 64 U/L (ref 38–126)
ALT: 15 U/L (ref 14–54)
AST: 40 U/L (ref 15–41)
Albumin: 3 g/dL — ABNORMAL LOW (ref 3.5–5.0)
Anion gap: 8 (ref 5–15)
BUN: 38 mg/dL — ABNORMAL HIGH (ref 6–20)
CALCIUM: 8.5 mg/dL — AB (ref 8.9–10.3)
CO2: 27 mmol/L (ref 22–32)
CREATININE: 0.89 mg/dL (ref 0.44–1.00)
Chloride: 99 mmol/L — ABNORMAL LOW (ref 101–111)
Glucose, Bld: 145 mg/dL — ABNORMAL HIGH (ref 65–99)
Potassium: 4.5 mmol/L (ref 3.5–5.1)
Sodium: 134 mmol/L — ABNORMAL LOW (ref 135–145)
Total Bilirubin: 0.6 mg/dL (ref 0.3–1.2)
Total Protein: 5.8 g/dL — ABNORMAL LOW (ref 6.5–8.1)

## 2015-09-07 LAB — I-STAT TROPONIN, ED: Troponin i, poc: 0.01 ng/mL (ref 0.00–0.08)

## 2015-09-07 MED ORDER — SODIUM CHLORIDE 0.9 % IV BOLUS (SEPSIS)
1000.0000 mL | Freq: Once | INTRAVENOUS | Status: AC
  Administered 2015-09-07: 1000 mL via INTRAVENOUS

## 2015-09-07 NOTE — Discharge Instructions (Signed)
Drink more fluids and follow up with your md as planned the end of the month

## 2015-09-07 NOTE — ED Provider Notes (Addendum)
[facility] DEPT Provider Note   CSN: [id number] Arrival date & time: 09/07/15  1316  By signing my name below, I, Evelene Croon, attest that this documentation has been prepared under the direction and in the presence of [name], MD . Electronically Signed: Evelene Croon, Scribe. 09/07/2015. 1:46 PM.   History   Chief Complaint Chief Complaint  Patient presents with  . Tachycardia    Patient states that she was sent over here by her nurse because her heart rate was fast. Patient has no complaint except mild weakness   The history is provided by the patient and a relative. No language interpreter was used.  Weakness  This is a new problem. The current episode started 12 to 24 hours ago. The problem occurs constantly. The problem has not changed since onset.Associated symptoms include headaches. Pertinent negatives include no chest pain and no abdominal pain. Nothing aggravates the symptoms. Nothing relieves the symptoms. She has tried nothing for the symptoms. The treatment provided no relief.    HPI Comments:  Megan Sims is a 60 y.o. female who presents to the Emergency Department complaining of palpitations today. Her home health nurse checked her HR today and it was 125bpm. They called her PCP who advised to come to the ED for evaluation. She notes associated HA and generalized weakness when she attempts to stand. Pt states on July 14th, 2017 she was diagnosed with a  femur fracture. She is currently trying to get into PT. She denies abdominal pain. Per son pt was  restarted on Amlodipine ~ 1 weeks ago.    PCP- Dr. Maudie Mercury; Wynetta Emery ([place])  Past Medical History:  Diagnosis Date  . Cancer [facility])    malignant neoplasm of unknown origin  . Cerebral aneurysm 2 brain surgeries 96 or 97  . Chronic neck pain 09/24/2014  . Diarrhea   . Emphysema of lung ([place]) 05/05/2015  . Hypertension   . Left knee pain   . Left leg weakness 06/2015  . Lung abnormality   . Malignant neoplasm of  unknown origin ([place])   . Migraine   . Nicotine dependence 09/24/2014  . Oral thrush 09/24/2014  . Paget disease of bone   . Pleurisy   . Renal insufficiency    Patient states " no kidney problems  . S/P biopsy    of throat per patient.  . Skin ulcer ([place]) 11/18/2014    Patient Active Problem List   Diagnosis Date Noted  . Diarrhea 09/02/2015  . Closed fracture of left femur ([place])   . Bleeding   . Acute blood loss anemia   . Fall from slip, trip, or stumble 08/07/2015  . Left femoral shaft fracture ([place]) 08/07/2015  . 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 S/P gastrostomy ([place]) 08/04/2015  . Deficiency anemia 08/03/2015  . Dehydration 07/02/2015  . Candida esophagitis ([place])   . Anemia due to other cause   . Mucosal abnormality of stomach   . Pill esophagitis due to tetracycline 05/24/2015  . UTI (lower urinary tract infection) 05/24/2015  . Emphysema of lung ([place]) 05/05/2015  . Rash, skin 05/05/2015  . Severe sepsis ([place]) 04/10/2015  . AKI (acute kidney injury) ([place]) 04/10/2015  . Hyponatremia 04/10/2015  . Sepsis due to pneumonia ([place])   . CAP (community acquired pneumonia) 04/09/2015  . Skin ulcer ([place]) 11/18/2014  . Musculoskeletal chest pain 10/25/2014  . Pain in the chest   . Tachycardia   . Anemia due to antineoplastic chemotherapy  10/21/2014  . Cancer of upper lobe of right lung ([place]) 10/07/2014  . Oral thrush 09/24/2014  . Nicotine dependence 09/24/2014  . Chronic neck pain 09/24/2014  . Excessive weight loss 09/24/2014  . 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 Bilateral lung cancer ([place])   . Paget's disease of bone   . Dysphagia 09/05/2014  . Essential hypertension 09/05/2014    Past Surgical History:  Procedure Laterality Date  . CEREBRAL ANEURYSM REPAIR  96 or 97  . CHEST TUBE INSERTION    . COLONOSCOPY     10 years ago in [place]   . ESOPHAGOGASTRODUODENOSCOPY  06/16/2015   Dr. Gala Romney: edentulous cricopharyngeus,  esophageal plaques, biopsy consistent with candida  . ESOPHAGOGASTRODUODENOSCOPY N/A 06/16/2015   Procedure: ESOPHAGOGASTRODUODENOSCOPY (EGD);  Surgeon: Daneil Dolin, MD;  [facility];  Service: Endoscopy;  Laterality: N/A;  . ESOPHAGOGASTRODUODENOSCOPY N/A 07/06/2015   Procedure: ESOPHAGOGASTRODUODENOSCOPY (EGD);  Surgeon: Danie Binder, MD;  Location[facility];  Service: Endoscopy;  Laterality: N/A;  . INTRAMEDULLARY (IM) NAIL INTERTROCHANTERIC Left 08/07/2015   Procedure: INTRAMEDULLARY (IM) NAIL INTERTROCHANTRIC;  Surgeon: Melrose Nakayama, MD;  Location: [facility];  Service: Orthopedics;  Laterality: Left;  . LARYNGOSCOPY  Jun 22, 2015   [facility], Dr. Erik Obey: normal oropharynx, no lesions, mobile vocal cords with good airway.  . PEG PLACEMENT N/A 07/06/2015   Procedure: PERCUTANEOUS ENDOSCOPIC GASTROSTOMY (PEG) PLACEMENT;  Surgeon: Danie Binder, MD;  [facility];  Service: Endoscopy;  Laterality: N/A;  . TUBAL LIGATION      OB History    Gravida Para Term Preterm AB Living             3   SAB TAB Ectopic Multiple Live Births                   Home Medications    Prior to Admission medications   Medication Sig Start Date End Date Taking? Authorizing Provider  amLODipine (NORVASC) 10 MG tablet Take 1 tablet (10 mg total) by mouth daily. Patient taking differently: Take 5 mg by mouth daily.  08/30/15   Heath Lark, MD  feeding supplement, ENSURE ENLIVE, (ENSURE ENLIVE) LIQD Place 237 mLs into feeding tube 4 (four) times daily. 09/02/15   Danie Binder, MD  HYDROcodone-acetaminophen (HYCET) 7.5-325 mg/15 ml solution Take 15 mLs by mouth every 6 (six) hours as needed. 07/28/15   Historical Provider, MD  HYDROcodone-acetaminophen (NORCO/VICODIN) 5-325 MG tablet Take 1-2 tablets by mouth every 6 (six) hours as needed for moderate pain. Patient not taking: Reported on 08/27/2015 08/16/15   Tiffany L Reed, DO  morphine (ROXANOL) 20 MG/ML concentrated solution Place 0.5  mLs (10 mg total) into feeding tube every 2 (two) hours as needed for severe pain. Patient not taking: Reported on 09/02/2015 08/31/15   Heath Lark, MD  Nutritional Supplements (FEEDING SUPPLEMENT, JEVITY 1.2 CAL,) LIQD Place 237 mLs into feeding tube every 3 (three) hours. Patient taking differently: Place 360 mLs into feeding tube every 4 (four) hours.  07/08/15   Rosita Fire, MD  omeprazole (PRILOSEC) 2 mg/mL SUSP Place 20 mLs (40 mg total) into feeding tube daily. 09/02/15   Danie Binder, MD  omeprazole (PRILOSEC) 20 MG capsule Liquid Form 2 mg/ml.  Take 10 ml (20 mg) twice daily. Patient not taking: Reported on 09/02/2015 08/06/15   Heath Lark, MD  predniSONE (DELTASONE) 20 MG tablet Take 1 tablet (20 mg total) by mouth daily with breakfast. 08/19/15   Ni  Alvy Bimler, MD  sucralfate (CARAFATE) 1 GM/10ML suspension Take 10 mLs (1 g total) by mouth 4 (four) times daily -  with meals and at bedtime. Patient not taking: Reported on 08/27/2015 08/06/15   Heath Lark, MD  zolpidem (AMBIEN) 10 MG tablet Take 10 mg by mouth at bedtime as needed for sleep.    Historical Provider, MD    Family History Family History  Problem Relation Age of Onset  . Hypertension    . Diabetes    . Kidney disease    . Cancer Mother     throat ca  . Cancer Maternal Grandmother     thyroid ca  . Colon cancer Neg Hx     Social History Social History  Substance Use Topics  . Smoking status: Current Every Day Smoker    Packs/day: 0.50    Years: 34.00    Types: Cigarettes  . Smokeless tobacco: Never Used     Comment: HOWEVER, will smoke 1 cigarette if "tragic" event (death), or if she needs it for her nerves   . Alcohol use No     Allergies   Review of patient's allergies indicates no known allergies.   Review of Systems Review of Systems  Constitutional: Negative for appetite change and fatigue.  HENT: Negative for congestion, ear discharge and sinus pressure.   Eyes: Negative for discharge.  Respiratory:  Negative for cough.   Cardiovascular: Positive for palpitations. Negative for chest pain.  Gastrointestinal: Negative for abdominal pain and diarrhea.  Genitourinary: Negative for frequency and hematuria.  Musculoskeletal: Negative for back pain.  Skin: Negative for rash.  Neurological: Positive for weakness (generalized) and headaches. Negative for seizures.  Psychiatric/Behavioral: Negative for hallucinations.     Physical Exam Updated Vital Signs BP 119/61 (BP Location: Right Arm)   Pulse 108   Temp 98.3 F (36.8 C) (Oral)   Resp 12   Ht '5\' 1"'$  (1.549 m)   Wt 114 lb (51.7 kg)   SpO2 98%   BMI 21.54 kg/m   Physical Exam  Constitutional: She is oriented to person, place, and time. She appears well-developed.  HENT:  Head: Normocephalic.  Eyes: Conjunctivae and EOM are normal. No scleral icterus.  Neck: Neck supple. No thyromegaly present.  Cardiovascular: Tachycardia present.  Exam reveals no gallop and no friction rub.   No murmur heard. Tachycardia  Pulmonary/Chest: No stridor. She has no wheezes. She has no rales. She exhibits no tenderness.  Abdominal: She exhibits no distension. There is no tenderness. There is no rebound.  PEG tube in place  Musculoskeletal: Normal range of motion. She exhibits tenderness. She exhibits no edema.  Tenderness noted to left femur (site of fracture)  Lymphadenopathy:    She has no cervical adenopathy.  Neurological: She is oriented to person, place, and time. She exhibits normal muscle tone. Coordination normal.  Skin: There is erythema.  Rash over bilateral eyes Dry scaly skin  Psychiatric: She has a normal mood and affect. Her behavior is normal.  Nursing note and vitals reviewed.    ED Treatments / Results   DIAGNOSTIC STUDIES:  Oxygen Saturation is 98% on RA, normal by my interpretation.    COORDINATION OF CARE:  1:39 PM Discussed treatment plan with pt at bedside and pt agreed to plan. Labs (all labs ordered are  listed, but only abnormal results are displayed) Labs Reviewed  CBC WITH DIFFERENTIAL/PLATELET  COMPREHENSIVE METABOLIC PANEL  I-STAT TROPOININ, ED    EKG  EKG Interpretation None  Radiology No results found.  Procedures Procedures (including critical care time)  Medications Ordered in ED Medications  sodium chloride 0.9 % bolus 1,000 mL (not administered)     Initial Impression / Assessment and Plan / ED Course  I have reviewed the triage vital signs and the nursing notes.  Pertinent labs & imaging results that were available during my care of the patient were reviewed by me and considered in my medical decision making (see chart for details).  Clinical Course    Patient's labs show that she's dehydrated. Patient was given a liter of fluids and her tachycardia resolved. She is instructed to take more fluids in and follow-up with her doctor  Final Clinical Impressions(s) / ED Diagnoses   Final diagnoses:  None    New Prescriptions New Prescriptions   No medications on file   The chart was scribed for me under my direct supervision.  I personally performed the history, physical, and medical decision making and all procedures in the evaluation of this patient.[name], MD 09/07/15 1611    [name], MD 09/07/15 [phone]

## 2015-09-07 NOTE — ED Triage Notes (Signed)
Patient brought in by EMS states home health nurse sent patient to ER due to heart rate of 125. Per EMS patient heart rate 110-115. Patient complaining of dizziness starting this morning upon awakening at 0500.

## 2015-09-08 ENCOUNTER — Encounter (HOSPITAL_COMMUNITY): Payer: Self-pay

## 2015-09-08 ENCOUNTER — Inpatient Hospital Stay (HOSPITAL_COMMUNITY)
Admission: EM | Admit: 2015-09-08 | Discharge: 2015-09-10 | DRG: 194 | Disposition: A | Discharge status: Home Health | Payer: Medicaid Other | Attending: Internal Medicine | Admitting: Internal Medicine

## 2015-09-08 DIAGNOSIS — Z808 Family history of malignant neoplasm of other organs or systems: Secondary | ICD-10-CM

## 2015-09-08 DIAGNOSIS — F17210 Nicotine dependence, cigarettes, uncomplicated: Secondary | ICD-10-CM | POA: Diagnosis present

## 2015-09-08 DIAGNOSIS — J189 Pneumonia, unspecified organism: Principal | ICD-10-CM | POA: Diagnosis present

## 2015-09-08 DIAGNOSIS — E860 Dehydration: Secondary | ICD-10-CM

## 2015-09-08 DIAGNOSIS — I10 Essential (primary) hypertension: Secondary | ICD-10-CM | POA: Diagnosis not present

## 2015-09-08 DIAGNOSIS — Z931 Gastrostomy status: Secondary | ICD-10-CM

## 2015-09-08 DIAGNOSIS — J440 Chronic obstructive pulmonary disease with acute lower respiratory infection: Secondary | ICD-10-CM | POA: Diagnosis present

## 2015-09-08 DIAGNOSIS — A419 Sepsis, unspecified organism: Secondary | ICD-10-CM | POA: Diagnosis not present

## 2015-09-08 DIAGNOSIS — R1310 Dysphagia, unspecified: Secondary | ICD-10-CM | POA: Diagnosis present

## 2015-09-08 DIAGNOSIS — Z79899 Other long term (current) drug therapy: Secondary | ICD-10-CM

## 2015-09-08 DIAGNOSIS — Z8249 Family history of ischemic heart disease and other diseases of the circulatory system: Secondary | ICD-10-CM | POA: Diagnosis not present

## 2015-09-08 DIAGNOSIS — X58XXXD Exposure to other specified factors, subsequent encounter: Secondary | ICD-10-CM | POA: Diagnosis present

## 2015-09-08 DIAGNOSIS — S7292XD Unspecified fracture of left femur, subsequent encounter for closed fracture with routine healing: Secondary | ICD-10-CM

## 2015-09-08 DIAGNOSIS — Z85118 Personal history of other malignant neoplasm of bronchus and lung: Secondary | ICD-10-CM

## 2015-09-08 DIAGNOSIS — R000 Tachycardia, unspecified: Secondary | ICD-10-CM | POA: Diagnosis not present

## 2015-09-08 DIAGNOSIS — Z833 Family history of diabetes mellitus: Secondary | ICD-10-CM | POA: Diagnosis not present

## 2015-09-08 DIAGNOSIS — S7292XA Unspecified fracture of left femur, initial encounter for closed fracture: Secondary | ICD-10-CM | POA: Diagnosis present

## 2015-09-08 DIAGNOSIS — Y95 Nosocomial condition: Secondary | ICD-10-CM | POA: Diagnosis present

## 2015-09-08 HISTORY — DX: Malignant neoplasm of unspecified part of unspecified bronchus or lung: C34.90

## 2015-09-08 HISTORY — DX: Anemia, unspecified: D64.9

## 2015-09-08 HISTORY — DX: Hypo-osmolality and hyponatremia: E87.1

## 2015-09-08 HISTORY — DX: Stevens-Johnson syndrome: L51.1

## 2015-09-08 LAB — URINE MICROSCOPIC-ADD ON

## 2015-09-08 LAB — BASIC METABOLIC PANEL
ANION GAP: 11 (ref 5–15)
BUN: 35 mg/dL — AB (ref 6–20)
CHLORIDE: 98 mmol/L — AB (ref 101–111)
CO2: 25 mmol/L (ref 22–32)
Calcium: 8.5 mg/dL — ABNORMAL LOW (ref 8.9–10.3)
Creatinine, Ser: 0.87 mg/dL (ref 0.44–1.00)
GFR calc Af Amer: >60 mL/min (ref >60)
GFR calc non Af Amer: >60 mL/min (ref >60)
Glucose, Bld: 116 mg/dL — ABNORMAL HIGH (ref 65–99)
POTASSIUM: 4.3 mmol/L (ref 3.5–5.1)
SODIUM: 134 mmol/L — AB (ref 135–145)

## 2015-09-08 LAB — CBC WITH DIFFERENTIAL/PLATELET
BASOS ABS: 0 10*3/uL (ref 0.0–0.1)
Basophils Relative: 0 %
Eosinophils Absolute: 0 10*3/uL (ref 0.0–0.7)
Eosinophils Relative: 0 %
HEMATOCRIT: 31.2 % — AB (ref 36.0–46.0)
HEMOGLOBIN: 10 g/dL — AB (ref 12.0–15.0)
LYMPHS ABS: 0.6 10*3/uL — AB (ref 0.7–4.0)
LYMPHS PCT: 4 %
MCH: 31.1 pg (ref 26.0–34.0)
MCHC: 32.1 g/dL (ref 30.0–36.0)
MCV: 96.9 fL (ref 78.0–100.0)
MONO ABS: 1.3 10*3/uL — AB (ref 0.1–1.0)
Monocytes Relative: 9 %
NEUTROS ABS: 12.7 10*3/uL — AB (ref 1.7–7.7)
NEUTROS PCT: 87 %
Platelets: ADEQUATE 10*3/uL (ref 150–400)
RBC: 3.22 MIL/uL — ABNORMAL LOW (ref 3.87–5.11)
RDW: 18.4 % — ABNORMAL HIGH (ref 11.5–15.5)
WBC: 14.6 10*3/uL — AB (ref 4.0–10.5)

## 2015-09-08 LAB — URINALYSIS, ROUTINE W REFLEX MICROSCOPIC
Bilirubin Urine: NEGATIVE
Glucose, UA: NEGATIVE mg/dL
HGB URINE DIPSTICK: NEGATIVE
Ketones, ur: NEGATIVE mg/dL
Leukocytes, UA: NEGATIVE
Nitrite: NEGATIVE
Protein, ur: 30 mg/dL — AB
SPECIFIC GRAVITY, URINE: 1.015 (ref 1.005–1.030)
pH: 6 (ref 5.0–8.0)

## 2015-09-08 LAB — PROTIME-INR
INR: 0.97
Prothrombin Time: 12.9 seconds (ref 11.4–15.2)

## 2015-09-08 LAB — APTT: APTT: 27 s (ref 24–36)

## 2015-09-08 LAB — PROCALCITONIN: PROCALCITONIN: 1.12 ng/mL

## 2015-09-08 MED ORDER — VANCOMYCIN HCL IN DEXTROSE 1-5 GM/200ML-% IV SOLN
1000.0000 mg | Freq: Once | INTRAVENOUS | Status: AC
  Administered 2015-09-08: 1000 mg via INTRAVENOUS
  Filled 2015-09-08: qty 200

## 2015-09-08 MED ORDER — ZOLPIDEM TARTRATE 5 MG PO TABS
5.0000 mg | ORAL_TABLET | Freq: Every evening | ORAL | Status: DC | PRN
  Administered 2015-09-08 – 2015-09-09 (×2): 5 mg via ORAL
  Filled 2015-09-08 (×2): qty 1

## 2015-09-08 MED ORDER — DEXTROSE 5 % IV SOLN
INTRAVENOUS | Status: AC
  Filled 2015-09-08 (×2): qty 1

## 2015-09-08 MED ORDER — JEVITY 1.2 CAL PO LIQD: 237.0000 mL | ORAL | Status: DC

## 2015-09-08 MED ORDER — ACETAMINOPHEN 325 MG PO TABS
650.0000 mg | ORAL_TABLET | Freq: Four times a day (QID) | ORAL | Status: DC | PRN
  Administered 2015-09-08 – 2015-09-09 (×3): 650 mg via ORAL
  Filled 2015-09-08 (×4): qty 2

## 2015-09-08 MED ORDER — HYDROCODONE-ACETAMINOPHEN 5-325 MG PO TABS: 1.0000 | ORAL_TABLET | Freq: Four times a day (QID) | ORAL | Status: DC | PRN

## 2015-09-08 MED ORDER — VANCOMYCIN HCL 500 MG IV SOLR
500.0000 mg | Freq: Two times a day (BID) | INTRAVENOUS | Status: DC
Start: 2015-09-09 — End: 2015-09-10
  Administered 2015-09-09 – 2015-09-10 (×3): 500 mg via INTRAVENOUS
  Filled 2015-09-08 (×9): qty 500

## 2015-09-08 MED ORDER — ACETAMINOPHEN 325 MG PO TABS
650.0000 mg | ORAL_TABLET | Freq: Once | ORAL | Status: DC
  Filled 2015-09-08: qty 2

## 2015-09-08 MED ORDER — PIPERACILLIN-TAZOBACTAM 3.375 G IVPB 30 MIN
3.3750 g | Freq: Once | INTRAVENOUS | Status: DC
  Filled 2015-09-08: qty 50

## 2015-09-08 MED ORDER — ALBUTEROL SULFATE (2.5 MG/3ML) 0.083% IN NEBU: 3.0000 mL | INHALATION_SOLUTION | Freq: Four times a day (QID) | RESPIRATORY_TRACT | Status: DC | PRN

## 2015-09-08 MED ORDER — SODIUM CHLORIDE 0.9 % IV BOLUS (SEPSIS)
500.0000 mL | Freq: Once | INTRAVENOUS | Status: AC
  Administered 2015-09-08: 500 mL via INTRAVENOUS

## 2015-09-08 MED ORDER — SODIUM CHLORIDE 0.9 % IV SOLN
INTRAVENOUS | Status: DC
  Administered 2015-09-08 – 2015-09-09 (×2): via INTRAVENOUS

## 2015-09-08 MED ORDER — DEXTROSE 5 % IV SOLN
1.0000 g | INTRAVENOUS | Status: DC
  Filled 2015-09-08: qty 10

## 2015-09-08 MED ORDER — ACETAMINOPHEN 650 MG RE SUPP: 650.0000 mg | Freq: Four times a day (QID) | RECTAL | Status: DC | PRN

## 2015-09-08 MED ORDER — DEXTROSE 5 % IV SOLN
500.0000 mg | INTRAVENOUS | Status: DC
  Filled 2015-09-08: qty 500

## 2015-09-08 MED ORDER — PREDNISONE 20 MG PO TABS
20.0000 mg | ORAL_TABLET | Freq: Every day | ORAL | Status: DC
  Administered 2015-09-09 – 2015-09-10 (×2): 20 mg via ORAL
  Filled 2015-09-08 (×3): qty 1

## 2015-09-08 MED ORDER — ENSURE ENLIVE PO LIQD
237.0000 mL | Freq: Four times a day (QID) | ORAL | Status: DC
  Administered 2015-09-08 – 2015-09-09 (×2): 237 mL

## 2015-09-08 MED ORDER — HEPARIN SODIUM (PORCINE) 5000 UNIT/ML IJ SOLN
5000.0000 [IU] | Freq: Three times a day (TID) | INTRAMUSCULAR | Status: DC
  Administered 2015-09-08 – 2015-09-09 (×4): 5000 [IU] via SUBCUTANEOUS
  Filled 2015-09-08 (×4): qty 1

## 2015-09-08 MED ORDER — VANCOMYCIN HCL IN DEXTROSE 1-5 GM/200ML-% IV SOLN: 1000.0000 mg | Freq: Once | INTRAVENOUS | Status: DC

## 2015-09-08 MED ORDER — DEXTROSE 5 % IV SOLN
1.0000 g | Freq: Three times a day (TID) | INTRAVENOUS | Status: DC
  Administered 2015-09-08 – 2015-09-10 (×5): 1 g via INTRAVENOUS
  Filled 2015-09-08 (×15): qty 1

## 2015-09-08 MED ORDER — SODIUM CHLORIDE 0.9% FLUSH
3.0000 mL | Freq: Two times a day (BID) | INTRAVENOUS | Status: DC
  Administered 2015-09-08 – 2015-09-10 (×3): 3 mL via INTRAVENOUS

## 2015-09-08 NOTE — Progress Notes (Signed)
Pt came up to unit with PEG tube. No orders forfeedings. MD notified that patient brought her own feedings and in room. MD stated that pt can do their own feedings for tonight and nutrition will be consulted tomorrow and have feedings ordered. [facility] called me as well and stated that Jevity that was ordered was a feeding she doesn't not take and stated "okay ill just D/C it then" Oswald Hillock, RN

## 2015-09-08 NOTE — Progress Notes (Signed)
Pharmacy Antibiotic Note  Megan Sims is a 60 y.o. female admitted on 09/08/2015 with pneumonia.  Pharmacy has been consulted for Vancomycin and Cefepime dosing.  Initial doses given in ED.  Plan: Vancomycin '500mg'$   IV every 12 hours.  Goal trough 15-20 mcg/mL.  Cefepime 1gm IV every 8 hours. Monitor labs, micro and vitals.   Height: '5\' 1"'$  (154.9 cm) Weight: 114 lb (51.7 kg) IBW/kg (Calculated) : 47.8  Temp (24hrs), Avg:98.7 F (37.1 C), Min:98.7 F (37.1 C), Max:98.7 F (37.1 C)   Recent Labs Lab 09/07/15 1406 09/08/15 1230  WBC 11.1* 14.6*  CREATININE 0.89 0.87    Estimated Creatinine Clearance: 51.9 mL/min (by C-G formula based on SCr of 0.87 mg/dL).    No Known Allergies  Antimicrobials this admission: Vanc 8/16 >>  Cefepime 8/16 >>   Dose adjustments this admission: n/a  Microbiology results: 8/16 BCx: pending 8/16 Sputum: pending  8/19 Strep urinary antigen:  pending  Thank you for allowing pharmacy to be a part of this patient's care.  Pricilla Larsson 09/08/2015 3:27 PM

## 2015-09-08 NOTE — ED Notes (Addendum)
No answer to nurse phone. Called desk and then transfer and no answer . Pt becoming upset and cussing staff and threatening to leave if not moved upstairs

## 2015-09-08 NOTE — H&P (Signed)
History and Physical    Megan Sims WGY:[id number] DOB: Jan 14, 1956 DOA: 09/08/2015  PCP: Jani Gravel, MD , previously seen Dr. Legrand Rams Patient coming from: Home  Chief Complaint: Tachycardia  HPI: Megan Sims is a 60 y.o. female with medical history significant of lung cancer followed by Dr.Gorsuch, reportedly in remission, recent left femoral fracture in July 2017, Paget's disease of bone, hypertension, emphysema who presents emergency department with complaints of tachycardia. Patient was seen in the emergency department one day prior to this hospital admission where she had heart rate into the 120s. Patient reports having elevated heart rate for the past few weeks prior to hospital admission. One day prior to this admission, patient was given IV fluids in the emergency department and was subsequently discharged home. Patient reported increased upper extremity swelling status post IV fluid hydration.  ED Course: In the emergency department, patient was noted have chest x-ray findings worrisome for possible pneumonia. Patient was also noted to have a white blood count of 14,000 with heart rate as high as 116 bpm. Patient was started on empiric antibiotics. Hospital service was consulted for consideration for admission  Review of Systems:  Review of Systems  Constitutional: Negative for chills and malaise/fatigue.  HENT: Negative for hearing loss and tinnitus.   Eyes: Negative for pain and discharge.  Respiratory: Positive for cough and sputum production.   Cardiovascular: Positive for palpitations. Negative for claudication.  Gastrointestinal: Negative for abdominal pain and blood in stool.  Genitourinary: Negative for frequency and urgency.  Musculoskeletal: Negative for back pain and myalgias.  Neurological: Negative for tremors and loss of consciousness.  Psychiatric/Behavioral: Negative for hallucinations and memory loss.    Past Medical History:  Diagnosis Date  . Anemia 08/03/2015  .  Cancer [facility])    malignant neoplasm of unknown origin  . Cerebral aneurysm 2 brain surgeries 96 or 97  . Chronic neck pain 09/24/2014  . Diarrhea   . Emphysema of lung ([place]) 05/05/2015  . Hypertension   . Hyponatremia   . Left knee pain   . Left leg weakness 06/2015  . Lung abnormality   . Lung cancer ([place]) 10/07/2014  . Malignant neoplasm of unknown origin ([place])   . Migraine   . Nicotine dependence 09/24/2014  . Oral thrush 09/24/2014  . Paget disease of bone   . Pleurisy   . Renal insufficiency    Patient states " no kidney problems  . S/P biopsy    of throat per patient.  . Skin ulcer ([place]) 11/18/2014  . 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 CEREBRAL ANEURYSM REPAIR  96 or 97  . CHEST TUBE INSERTION    . COLONOSCOPY     10 years ago in [place]   . ESOPHAGOGASTRODUODENOSCOPY  06/16/2015   Dr. Gala Romney: edentulous cricopharyngeus, esophageal plaques, biopsy consistent with candida  . ESOPHAGOGASTRODUODENOSCOPY N/A 06/16/2015   Procedure: ESOPHAGOGASTRODUODENOSCOPY (EGD);  Surgeon: Daneil Dolin, MD;  Location: [facility] ENDO SU[facility];  Service: Endoscopy;  Laterality: N/A;  . ESOPHAGOGASTRODUODENOSCOPY N/A 07/06/2015   Procedure: ESOPHAGOGASTRODUODENOSCOPY (EGD);  Surgeon: Danie Binder, MD;  Location: [facility] ENDO SUITE;  Service: Endoscopy;  Laterality: N/A;  . INTRAMEDULLARY (IM) NAIL INTERTROCHANTERIC Left 08/07/2015   Procedure: INTRAMEDULLARY (IM) NAIL INTERTROCHANTRIC;  Surgeon: Melrose Nakayama, MD;  Location: [facility] ORS;  Service: Orthopedics;  Laterality: Left;  . LARYNGOSCOPY  Jun 22, 2015   [facility], Dr. Erik Obey: normal oropharynx, no lesions, mobile vocal cords with  good airway.  . PEG PLACEMENT N/A 07/06/2015   Procedure: PERCUTANEOUS ENDOSCOPIC GASTROSTOMY (PEG) PLACEMENT;  Surgeon: Danie Binder, MD;  Location: [facility] SU[facility];  Service: Endoscopy;  Laterality: N/A;  . TUBAL LIGATION       reports that she has been smoking  Cigarettes.  She has a 17.00 pack-year smoking history. She has never used smokeless tobacco. She reports that she does not drink alcohol or use drugs.  No Known Allergies  Family History  Problem Relation Age of Onset  . Hypertension    . Diabetes    . Kidney disease    . Cancer Mother     throat ca  . Cancer Maternal Grandmother     thyroid ca  . Colon cancer Neg Hx     Prior to Admission medications   Medication Sig Start Date End Date Taking? Authorizing Provider  albuterol (PROVENTIL HFA;VENTOLIN HFA) 108 (90 Base) MCG/ACT inhaler Inhale 1-2 puffs into the lungs every 6 (six) hours as needed for wheezing or shortness of breath.    Historical Provider, MD  amLODipine (NORVASC) 10 MG tablet Take 1 tablet (10 mg total) by mouth daily. Patient taking differently: Take 5 mg by mouth daily.  08/30/15   Heath Lark, MD  feeding supplement, ENSURE ENLIVE, (ENSURE ENLIVE) LIQD Place 237 mLs into feeding tube 4 (four) times daily. 09/02/15   Danie Binder, MD  HYDROcodone-acetaminophen (HYCET) 7.5-325 mg/15 ml solution Take 15 mLs by mouth every 6 (six) hours as needed. 07/28/15   Historical Provider, MD  HYDROcodone-acetaminophen (NORCO/VICODIN) 5-325 MG tablet Take 1-2 tablets by mouth every 6 (six) hours as needed for moderate pain. Patient not taking: Reported on 08/27/2015 08/16/15   Tiffany L Reed, DO  morphine (ROXANOL) 20 MG/ML concentrated solution Place 0.5 mLs (10 mg total) into feeding tube every 2 (two) hours as needed for severe pain. Patient not taking: Reported on 09/02/2015 08/31/15   Heath Lark, MD  Nutritional Supplements (FEEDING SUPPLEMENT, JEVITY 1.2 CAL,) LIQD Place 237 mLs into feeding tube every 3 (three) hours. Patient not taking: Reported on 09/07/2015 07/08/15   Rosita Fire, MD  omeprazole (PRILOSEC) 2 mg/mL SUSP Place 20 mLs (40 mg total) into feeding tube daily. Patient not taking: Reported on 09/07/2015 09/02/15   Danie Binder, MD  predniSONE (DELTASONE) 20 MG tablet  Take 1 tablet (20 mg total) by mouth daily with breakfast. 08/19/15   Heath Lark, MD  sucralfate (CARAFATE) 1 GM/10ML suspension Take 10 mLs (1 g total) by mouth 4 (four) times daily -  with meals and at bedtime. Patient not taking: Reported on 08/27/2015 08/06/15   Heath Lark, MD  zolpidem (AMBIEN) 10 MG tablet Take 10 mg by mouth at bedtime as needed for sleep.    Historical Provider, MD    Physical Exam: Vitals:   09/08/15 1300 09/08/15 1330 09/08/15 1400 09/08/15 1430  BP: 122/61 107/57 122/57 114/56  Pulse:      Resp: '20 16 18 24  '$ Temp:      TempSrc:      SpO2:      Weight:      Height:        Constitutional: NAD, calm, comfortable Vitals:   09/08/15 1300 09/08/15 1330 09/08/15 1400 09/08/15 1430  BP: 122/61 107/57 122/57 114/56  Pulse:      Resp: '20 16 18 24  '$ Temp:      TempSrc:      SpO2:      Weight:  Height:       Eyes: PERRL, lids and conjunctivae normal ENMT: Mucous membranes are moist. Posterior pharynx clear of any exudate or lesions.Normal dentition.  Neck: normal, supple, no masses, no thyromegaly Respiratory: Coarse breath sounds bilaterally, right greater than left, no audible wheezing, normal respiratory effort  Cardiovascular: Regular rate and rhythm  Abdomen: no tenderness, no masses palpated. No hepatosplenomegaly. Bowel sounds positive.  Musculoskeletal: no clubbing / cyanosis. No joint deformity upper and lower extremities. Good ROM, no contractures. Normal muscle tone.  Skin: no rashes, lesions, ulcers. No induration Neurologic: CN 2-12 grossly intact. Sensation intact, DTR normal. Strength 5/5 in all 4.  Psychiatric: Normal judgment and insight. Alert and oriented x 3. Normal mood.    Labs on Admission: I have personally reviewed following labs and imaging studies  CBC:  Recent Labs Lab 09/07/15 1406 09/08/15 1230  WBC 11.1* 14.6*  NEUTROABS 9.6* 12.7*  HGB 10.0* 10.0*  HCT 31.2* 31.2*  MCV 95.4 96.9  PLT 295 PLATELET CLUMPS NOTED ON  SMEAR, COUNT APPEARS ADEQUATE   Basic Metabolic Panel:  Recent Labs Lab 09/07/15 1406 09/08/15 1230  NA 134* 134*  K 4.5 4.3  CL 99* 98*  CO2 27 25  GLUCOSE 145* 116*  BUN 38* 35*  CREATININE 0.89 0.87  CALCIUM 8.5* 8.5*   GFR: Estimated Creatinine Clearance: 51.9 mL/min (by C-G formula based on SCr of 0.87 mg/dL). Liver Function Tests:  Recent Labs Lab 09/07/15 1406  AST 40  ALT 15  ALKPHOS 64  BILITOT 0.6  PROT 5.8*  ALBUMIN 3.0*   No results for input(s): LIPASE, AMYLASE in the last 168 hours. No results for input(s): AMMONIA in the last 168 hours. Coagulation Profile: No results for input(s): INR, PROTIME in the last 168 hours. Cardiac Enzymes: No results for input(s): CKTOTAL, CKMB, CKMBINDEX, TROPONINI in the last 168 hours. BNP (last 3 results) No results for input(s): PROBNP in the last 8760 hours. HbA1C: No results for input(s): HGBA1C in the last 72 hours. CBG: No results for input(s): GLUCAP in the last 168 hours. Lipid Profile: No results for input(s): CHOL, HDL, LDLCALC, TRIG, CHOLHDL, LDLDIRECT in the last 72 hours. Thyroid Function Tests: No results for input(s): TSH, T4TOTAL, FREET4, T3FREE, THYROIDAB in the last 72 hours. Anemia Panel: No results for input(s): VITAMINB12, FOLATE, FERRITIN, TIBC, IRON, RETICCTPCT in the last 72 hours. Urine analysis:    Component Value Date/Time   COLORURINE YELLOW 09/08/2015 [place] 09/08/2015 1237   LABSPEC 1.015 09/08/2015 1237   PHURINE 6.0 09/08/2015 1237   GLUCOSEU NEGATIVE 09/08/2015 1237   HGBUR NEGATIVE 09/08/2015 1237   BILIRUBINUR NEGATIVE 09/08/2015 1237   KETONESUR NEGATIVE 09/08/2015 1237   PROTEINUR 30 (A) 09/08/2015 1237   UROBILINOGEN 0.2 10/25/2014 0021   NITRITE NEGATIVE 09/08/2015 1237   LEUKOCYTESUR NEGATIVE 09/08/2015 1237   Sepsis Labs: !!!!!!!!!!!!!!!!!!!!!!!!!!!!!!!!!!!!!!!!!!!! '@LABRCNTIP'$ (procalcitonin:4,lacticidven:4) )No results found for this or any  previous visit (from the past 240 hour(s)).   Radiological Exams on Admission: Dg Chest 2 View  Result Date: 09/07/2015 CLINICAL DATA:  Tachycardia.  Cancer of unknown origin. EXAM: CHEST  2 VIEW COMPARISON:  Chest x-ray and chest CT 08/27/2015 FINDINGS: Heart size within normal limits.  Negative for heart failure. Port-A-Cath tip in the proximal SVC unchanged. Progressive right middle lobe consolidation with volume loss. Right perihilar infiltrate seen on the prior studies slightly more prominent. Possible pneumonia. No pleural effusion. IMPRESSION: Progressive right middle lobe consolidation and volume loss. Progressive right perihilar airspace  disease, possible pneumonia. Electronically Signed   By: Franchot Gallo M.D.   On: 09/07/2015 14:50    EKG: Independently reviewed. Sinus tachycardia  Assessment/Plan Principal Problem:   Sepsis due to pneumonia [facility]) Active Problems:   Essential hypertension   Tachycardia   Closed fracture of left femur (HCC)   HCAP (healthcare-associated pneumonia)   1. Sepsis with pneumonia, sepsis present on admission 1. Patient presents with tachycardia, leukocytosis, chest x-ray findings worrisome for pneumonia 2. Recent CTA chest reviewed. Patient has a masslike density noted in the right lung, possibly infectious however cannot rule out malignancy. 3. For now, will continue empiric antibiotics as per below 4. Patient is already scheduled to follow-up with Dr. Alvy Bimler on 09/23/2015 5. Recommend reimaging at that time. If masslike density persists, then would be more concerned of a malignant process. Otherwise, if lesion improves following antibiotics, it would likely infectious in etiology. 2. History of lung cancer 1. Per above, patient is followed by Dr. Alvy Bimler 2. Reportedly in remission. 3. See above, patient's recent CTA chest has a masslike density, cannot rule out malignant process 3. Hypertension 1. Blood pressure currently stable  controlled 2. Continue to monitor 4. Recent left femoral neck fracture 1. Recently admitted in July 2017. Patient was seen by orthopedic surgery at that time. Recommendations during that admission for partial weightbearing. 2. We'll request physical therapy while admitted. 5. Healthcare associated pneumonia 1. Per above, patient was recently admitted in July 2017. As such, will treat for healthcare associated pneumonia.  DVT prophylaxis: Heparin subcutaneous  Code Status: Full code Family Communication: Patient in room, family at bedside  Disposition Plan: Uncertain at this time  Consults called:  Admission status: Admission, observation   CHIU, Orpah Melter MD [facility] Pager [phone]  If 7PM-7AM, please contact night-coverage [email or web address] Password [email or web address]  09/08/2015, 3:15 PM

## 2015-09-08 NOTE — ED Provider Notes (Signed)
[facility] DEPT Provider Note   CSN: [id number] Arrival date & time: 09/08/15  1144     History   Chief Complaint Chief Complaint  Patient presents with  . Tachycardia    HPI Megan Sims is a 60 y.o. female.  HPI Patient was brought in for fast heart rate. His had heart rates up to around 120. States she's had it for the last few weeks. She was seen in the ER for the same yesterday. She's been seen by her oncologist also. Seen by her primary care doctor. Yesterday he thought was that she may be dehydrated. She's had a little bit of a cough with some mild sputum production. No fevers. Has had fatigue. She was given fluids yesterday states her arms now feel swollen. She has had CT angiography of same. No dysuria. No abdominal pain. She has reported stage III lung cancer. She has a new physical therapist who is not seen before and sent her into the ER today. She states that her arms feel more swollen today. She has a PEG tube.    Past Medical History:  Diagnosis Date  . Anemia 08/03/2015  . Cancer [facility])    malignant neoplasm of unknown origin  . Cerebral aneurysm 2 brain surgeries 96 or 97  . Chronic neck pain 09/24/2014  . Diarrhea   . Emphysema of lung ([place]) 05/05/2015  . Hypertension   . Hyponatremia   . Left knee pain   . Left leg weakness 06/2015  . Lung abnormality   . Lung cancer ([place]) 10/07/2014  . Malignant neoplasm of unknown origin ([place])   . Migraine   . Nicotine dependence 09/24/2014  . Oral thrush 09/24/2014  . Paget disease of bone   . Pleurisy   . Renal insufficiency    Patient states " no kidney problems  . S/P biopsy    of throat per patient.  . Skin ulcer ([place]) 11/18/2014  . 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 HCAP (healthcare-associated pneumonia) 09/08/2015  . Diarrhea 09/02/2015  . Closed fracture of left femur ([place])   . Bleeding   . Acute blood loss anemia   . Fall from slip, trip,  or stumble 08/07/2015  . Left femoral shaft fracture ([place]) 08/07/2015  . 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 S/P gastrostomy ([place]) 08/04/2015  . Deficiency anemia 08/03/2015  . Dehydration 07/02/2015  . Candida esophagitis ([place])   . Anemia due to other cause   . Mucosal abnormality of stomach   . Pill esophagitis due to tetracycline 05/24/2015  . UTI (lower urinary tract infection) 05/24/2015  . Emphysema of lung ([place]) 05/05/2015  . Rash, skin 05/05/2015  . Severe sepsis ([place]) 04/10/2015  . AKI (acute kidney injury) ([place]) 04/10/2015  . Hyponatremia 04/10/2015  . Sepsis due to pneumonia ([place])   . CAP (community acquired pneumonia) 04/09/2015  . Skin ulcer ([place]) 11/18/2014  . Musculoskeletal chest pain 10/25/2014  . Pain in the chest   . Tachycardia   . Anemia due to antineoplastic chemotherapy 10/21/2014  . Cancer of upper lobe of right lung ([place]) 10/07/2014  . Oral thrush 09/24/2014  . Nicotine dependence 09/24/2014  . Chronic neck pain 09/24/2014  . Excessive weight loss 09/24/2014  . 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 Bilateral lung cancer ([place])   . Paget's disease of bone   . Dysphagia 09/05/2014  . Essential hypertension 09/05/2014    Past  Surgical History:  Procedure Laterality Date  . CEREBRAL ANEURYSM REPAIR  96 or 97  . CHEST TUBE INSERTION    . COLONOSCOPY     10 years ago in [place]   . ESOPHAGOGASTRODUODENOSCOPY  06/16/2015   Dr. Gala Romney: edentulous cricopharyngeus, esophageal plaques, biopsy consistent with candida  . ESOPHAGOGASTRODUODENOSCOPY N/A 06/16/2015   Procedure: ESOPHAGOGASTRODUODENOSCOPY (EGD);  Surgeon: Daneil Dolin, MD;  Location: [facility] END[facility] SU[facility];  Service: Endoscopy;  Laterality: N/A;  . ESOPHAGOGASTRODUODENOSCOPY N/A 07/06/2015   Procedure: ESOPHAGOGASTRODUODENOSCOPY (EGD);  Surgeon: Danie Binder, MD;  Location: [facility] ENDO SUITE;  Service: Endoscopy;  Laterality: N/A;  . INTRAMEDULLARY (IM) NAIL  INTERTROCHANTERIC Left 08/07/2015   Procedure: INTRAMEDULLARY (IM) NAIL INTERTROCHANTRIC;  Surgeon: Melrose Nakayama, MD;  Location: [facility] ORS;  Service: Orthopedics;  Laterality: Left;  . LARYNGOSCOPY  Jun 22, 2015   [facility], Dr. Erik Obey: normal oropharynx, no lesions, mobile vocal cords with good airway.  . PEG PLACEMENT N/A 07/06/2015   Procedure: PERCUTANEOUS ENDOSCOPIC GASTROSTOMY (PEG) PLACEMENT;  Surgeon: Danie Binder, MD;  Location: [facility] END[facility] SU[facility];  Service: Endoscopy;  Laterality: N/A;  . TUBAL LIGATION      OB History    Gravida Para Term Preterm AB Living             3   SAB TAB Ectopic Multiple Live Births                   Home Medications    Prior to Admission medications   Medication Sig Start Date End Date Taking? Authorizing Provider  albuterol (PROVENTIL HFA;VENTOLIN HFA) 108 (90 Base) MCG/ACT inhaler Inhale 1-2 puffs into the lungs every 6 (six) hours as needed for wheezing or shortness of breath.   Yes Historical Provider, MD  amLODipine (NORVASC) 10 MG tablet Take 1 tablet (10 mg total) by mouth daily. Patient taking differently: Take 5 mg by mouth daily.  08/30/15  Yes Heath Lark, MD  HYDROcodone-acetaminophen (HYCET) 7.5-325 mg/15 ml solution Take 15 mLs by mouth every 6 (six) hours as needed. 07/28/15  Yes Historical Provider, MD  predniSONE (DELTASONE) 20 MG tablet Take 1 tablet (20 mg total) by mouth daily with breakfast. 08/19/15  Yes Heath Lark, MD  zolpidem (AMBIEN) 10 MG tablet Take 10 mg by mouth at bedtime as needed for sleep.   Yes Historical Provider, MD  amoxicillin-clavulanate (AUGMENTIN) 250-62.5 MG/5ML suspension Place 10 mLs (500 mg total) into feeding tube 3 (three) times daily. 09/10/15 09/17/15  Donne Hazel, MD  HYDROcodone-acetaminophen (NORCO/VICODIN) 5-325 MG tablet Take 1-2 tablets by mouth every 6 (six) hours as needed for moderate pain. Patient not taking: Reported on 08/27/2015 08/16/15   Tiffany L Reed, DO  metoprolol tartrate (LOPRESSOR) 25 MG  tablet Take 0.5 tablets (12.5 mg total) by mouth 2 (two) times daily. 09/10/15   Donne Hazel, MD  Nutritional Supplements (FEEDING SUPPLEMENT, JEVITY 1.2 CAL,) LIQD Place 237 mLs into feeding tube every 3 (three) hours. Patient not taking: Reported on 09/07/2015 07/08/15   Rosita Fire, MD  sucralfate (CARAFATE) 1 GM/10ML suspension Take 10 mLs (1 g total) by mouth 4 (four) times daily -  with meals and at bedtime. Patient not taking: Reported on 08/27/2015 08/06/15   Heath Lark, MD    Family History Family History  Problem Relation Age of Onset  . Hypertension    . Diabetes    . Kidney disease    . Cancer Mother     throat ca  . Cancer  Maternal Grandmother     thyroid ca  . Colon cancer Neg Hx     Social History Social History  Substance Use Topics  . Smoking status: Current Every Day Smoker    Packs/day: 0.50    Years: 34.00    Types: Cigarettes  . Smokeless tobacco: Never Used     Comment: HOWEVER, will smoke 1 cigarette if "tragic" event (death), or if she needs it for her nerves   . Alcohol use No     Allergies   Hydrocodone-acetaminophen; Tetracyclines & related; and Tramadol   Review of Systems Review of Systems  Constitutional: Positive for appetite change and fatigue. Negative for fever.  Respiratory: Positive for cough.   Cardiovascular: Negative for chest pain.  Gastrointestinal: Negative for abdominal pain.  Genitourinary: Negative for difficulty urinating and dyspareunia.  Musculoskeletal: Negative for back pain.  Neurological: Negative for light-headedness.  Psychiatric/Behavioral: Negative for behavioral problems.     Physical Exam Updated Vital Signs BP (!) 159/79 (BP Location: Right Arm)   Pulse 97   Temp 98.3 F (36.8 C) (Oral)   Resp 20   Ht '5\' 1"'$  (1.549 m)   Wt 117 lb 8 oz (53.3 kg)   SpO2 100%   BMI 22.20 kg/m   Physical Exam  Constitutional: She appears well-developed.  HENT:  Head: Atraumatic.  Neck: Neck supple.    Cardiovascular:  Mild tachycardia  Pulmonary/Chest:  Mildly harsh breath sounds.  Abdominal: Soft. There is no tenderness.  Musculoskeletal: She exhibits no edema.  Neurological: She is alert.  Skin: Skin is warm.  Chronic skin changes  Psychiatric: She has a normal mood and affect.     ED Treatments / Results  Labs (all labs ordered are listed, but only abnormal results are displayed) Labs Reviewed  URINALYSIS, ROUTINE W REFLEX MICROSCOPIC (NOT AT [facility]) - Abnormal; Notable for the following:       Result Value   Protein, ur 30 (*)    All other components within normal limits  BASIC METABOLIC PANEL - Abnormal; Notable for the following:    Sodium 134 (*)    Chloride 98 (*)    Glucose, Bld 116 (*)    BUN 35 (*)    Calcium 8.5 (*)    All other components within normal limits  CBC WITH DIFFERENTIAL/PLATELET - Abnormal; Notable for the following:    WBC 14.6 (*)    RBC 3.22 (*)    Hemoglobin 10.0 (*)    HCT 31.2 (*)    RDW 18.4 (*)    Neutro Abs 12.7 (*)    Lymphs Abs 0.6 (*)    Monocytes Absolute 1.3 (*)    All other components within normal limits  URINE MICROSCOPIC-ADD ON - Abnormal; Notable for the following:    Squamous Epithelial / LPF 6-30 (*)    Bacteria, UA FEW (*)    All other components within normal limits  COMPREHENSIVE METABOLIC PANEL - Abnormal; Notable for the following:    BUN 29 (*)    Calcium 8.4 (*)    Total Protein 5.1 (*)    Albumin 2.7 (*)    ALT 13 (*)    All other components within normal limits  CBC - Abnormal; Notable for the following:    RBC 3.17 (*)    Hemoglobin 9.8 (*)    HCT 30.5 (*)    RDW 18.4 (*)    All other components within normal limits  COMPREHENSIVE METABOLIC PANEL - Abnormal; Notable for the following:  BUN 25 (*)    Calcium 8.3 (*)    Total Protein 4.9 (*)    Albumin 2.6 (*)    ALT 12 (*)    All other components within normal limits  CULTURE, BLOOD (ROUTINE X 2)  CULTURE, BLOOD (ROUTINE X 2)  CULTURE,  EXPECTORATED SPUTUM-ASSESSMENT  GRAM STAIN  HIV ANTIBODY (ROUTINE TESTING)  STREP PNEUMONIAE URINARY ANTIGEN  PROCALCITONIN  APTT  PROTIME-INR    EKG  EKG Interpretation  Date/Time:  Wednesday September 08 2015 11:53:13 EDT Ventricular Rate:  106 PR Interval:    QRS Duration: 80 QT Interval:  327 QTC Calculation: 435 R Axis:   71 Text Interpretation:  Sinus tachycardia LAE, consider biatrial enlargement Anterior infarct, old Baseline wander in lead(s) V3 Confirmed by Alvino Chapel  MD, Ovid Curd [phone]) on 09/08/2015 12:12:25 PM       Radiology No results found.  Procedures Procedures (including critical care time)  Medications Ordered in ED Medications  sodium chloride 0.9 % bolus 500 mL (0 mLs Intravenous Stopped 09/08/15 1618)  vancomycin (VANCOCIN) IVPB 1000 mg/200 mL premix (1,000 mg Intravenous New Bag/Given 09/08/15 1537)     Initial Impression / Assessment and Plan / ED Course  I have reviewed the triage vital signs and the nursing notes.  Pertinent labs & imaging results that were available during my care of the patient were reviewed by me and considered in my medical decision making (see chart for details).  Clinical Course    Patient presents again with some generalized weakness and persistent tachycardia. Has had a cough with some sputum production. Chest x-ray done yesterday is worsened prior. Possible pneumonia. White count is increased. Lab work similar to yesterday with some dehydration possible with it. Will admit to internal medicine for treatment of pneumonia. Has previously been worked up for pulmonary embolism with a similar symptoms.  Final Clinical Impressions(s) / ED Diagnoses   Final diagnoses:  HCAP (healthcare-associated pneumonia)  Tachycardia  Dehydration    New Prescriptions Discharge Medication List as of 09/10/2015  1:18 PM    START taking these medications   Details  amoxicillin-clavulanate (AUGMENTIN) 250-62.5 MG/5ML suspension Place 10  mLs (500 mg total) into feeding tube 3 (three) times daily., Starting Fri 09/10/2015, Until Fri 09/17/2015, No Print    metoprolol tartrate (LOPRESSOR) 25 MG tablet Take 0.5 tablets (12.5 mg total) by mouth 2 (two) times daily., Starting Fri 09/10/2015, No Print         Davonna Belling, MD 09/13/15 [phone]

## 2015-09-08 NOTE — ED Triage Notes (Signed)
Pt brought in by EMS due to elevated HR 110-112. Physical therapy went to see pt today and HR was elevated. No complaints from residents. Pt was seen yesterday for same reason, sent by home health nurse yesterday. Reports both arms feel swollen

## 2015-09-09 ENCOUNTER — Telehealth: Payer: Self-pay

## 2015-09-09 LAB — COMPREHENSIVE METABOLIC PANEL
ALBUMIN: 2.7 g/dL — AB (ref 3.5–5.0)
ALK PHOS: 55 U/L (ref 38–126)
ALT: 13 U/L — AB (ref 14–54)
ANION GAP: 7 (ref 5–15)
AST: 37 U/L (ref 15–41)
BUN: 29 mg/dL — ABNORMAL HIGH (ref 6–20)
CALCIUM: 8.4 mg/dL — AB (ref 8.9–10.3)
CHLORIDE: 103 mmol/L (ref 101–111)
CO2: 25 mmol/L (ref 22–32)
CREATININE: 0.75 mg/dL (ref 0.44–1.00)
GFR calc non Af Amer: >60 mL/min (ref >60)
Glucose, Bld: 99 mg/dL (ref 65–99)
Potassium: 3.7 mmol/L (ref 3.5–5.1)
SODIUM: 135 mmol/L (ref 135–145)
Total Bilirubin: 0.8 mg/dL (ref 0.3–1.2)
Total Protein: 5.1 g/dL — ABNORMAL LOW (ref 6.5–8.1)

## 2015-09-09 LAB — HIV ANTIBODY (ROUTINE TESTING W REFLEX): HIV Screen 4th Generation wRfx: NONREACTIVE

## 2015-09-09 LAB — CBC
HCT: 30.5 % — ABNORMAL LOW (ref 36.0–46.0)
HEMOGLOBIN: 9.8 g/dL — AB (ref 12.0–15.0)
MCH: 30.9 pg (ref 26.0–34.0)
MCHC: 32.1 g/dL (ref 30.0–36.0)
MCV: 96.2 fL (ref 78.0–100.0)
PLATELETS: 260 10*3/uL (ref 150–400)
RBC: 3.17 MIL/uL — AB (ref 3.87–5.11)
RDW: 18.4 % — ABNORMAL HIGH (ref 11.5–15.5)
WBC: 10.5 10*3/uL (ref 4.0–10.5)

## 2015-09-09 LAB — STREP PNEUMONIAE URINARY ANTIGEN: STREP PNEUMO URINARY ANTIGEN: NEGATIVE

## 2015-09-09 MED ORDER — METOPROLOL TARTRATE 25 MG PO TABS
12.5000 mg | ORAL_TABLET | Freq: Two times a day (BID) | ORAL | Status: DC
  Administered 2015-09-09 – 2015-09-10 (×3): 12.5 mg via ORAL
  Filled 2015-09-09 (×4): qty 1

## 2015-09-09 MED ORDER — JEVITY 1.2 CAL PO LIQD
360.0000 mL | Freq: Four times a day (QID) | ORAL | Status: DC
  Administered 2015-09-09 – 2015-09-10 (×3): 360 mL
  Filled 2015-09-09 (×17): qty 1000

## 2015-09-09 MED ORDER — JEVITY 1.2 CAL PO LIQD: 1000.0000 mL | ORAL | Status: DC

## 2015-09-09 MED ORDER — JEVITY 1.2 CAL PO LIQD
360.0000 mL | Freq: Four times a day (QID) | ORAL | Status: DC
  Filled 2015-09-09 (×5): qty 474

## 2015-09-09 MED ORDER — JEVITY 1.2 CAL PO LIQD
360.0000 mL | Freq: Four times a day (QID) | ORAL | Status: DC
  Filled 2015-09-09 (×4): qty 474

## 2015-09-09 NOTE — Clinical Social Work Note (Signed)
Clinical Social Work Assessment  Patient Details  Name: Megan Sims MRN: [id number] Date of Birth: [birth year]  Date of referral:  09/09/15               Reason for consult:  Discharge Planning                Permission sought to share information with:    Permission granted to share information::     Name::        Agency::     Relationship::     Contact Information:     Housing/Transportation Living arrangements for the past 2 months:  Single Family Home Source of Information:  Patient Patient Interpreter Needed:  None Criminal Activity/Legal Involvement Pertinent to Current Situation/Hospitalization:  No - Comment as needed Significant Relationships:  Significant Other Lives with:  Significant Other Do you feel safe going back to the place where you live?  Yes Need for family participation in patient care:  Yes (Comment)  Care giving concerns:  PT recommending SNF, but pt refuses.    Social Worker assessment / plan:  CSW met with pt at bedside. Pt alert and oriented and reports that she lives with her significant other of 18 years. Pt states that she had a leg fracture last month and went to SNF for 3 days before signing herself out. She indicates that she did not like it and will not go again. Pt reports having her chair, bed, and bedside commode within transfer distance at home. She is able to transfer independently. Pt is aware of PT recommendation for SNF, but feels she is at baseline and does not feel this is necessary. She has [facility] home health services and is agreeable to resume this at d/c. CM notified.   Employment status:  Disabled (Comment on whether or not currently receiving Disability) Insurance information:  Medicaid In [place] PT Recommendations:  [place] / Referral to community resources:  [place]  Patient/Family's Response to care:  Pt is adamant that she is not going to SNF at d/c and shares that this was an awful  experience for her last month.   Patient/Family's Understanding of and Emotional Response to Diagnosis, Current Treatment, and Prognosis:  Pt is aware of admission diagnosis and treatment plan. She states that she will return home with home health services when medically stable.   Emotional Assessment Appearance:  Appears stated age Attitude/Demeanor/Rapport:  Other (Cooperative) Affect (typically observed):  Accepting Orientation:  Oriented to Self, Oriented to Place, Oriented to  Time, Oriented to Situation Alcohol / Substance use:  Not Applicable Psych involvement (Current and /or in the community):  No (Comment)  Discharge Needs  Concerns to be addressed:  Discharge Planning Concerns Readmission within the last 30 days:  Yes Current discharge risk:  Physical Impairment Barriers to Discharge:  No Barriers Identified   Salome Arnt, [place] 09/09/2015, 12:58 PM [phone]

## 2015-09-09 NOTE — Care Management Note (Addendum)
Case Management Note  Patient Details  Name: Megan Sims MRN: [id number] Date of Birth: 06-04-1955  Subjective/Objective:                  Pt admitted with sepsis/pneumonia. She is from home, lives with her son and was ind with ADL's at baseline. She recently suffered from femoral fx. She is active with [facility] for nursing and therapy. She states she has all of the DME she requires at home. She has tube feedings and gets tube feed [facility]. PT has recommended SNF, pt refuses and anticipates returning home with resumption of HH servvices. Abby, of [facility], aware of admission and will be updated on DC date. DC plan discussed with son Montine Circle) per pt's request.  Action/Plan: Will cont to follow.   Expected Discharge Date:  09/10/15               Expected Discharge Plan:  [place]  In-House Referral:  Clinical Social Work  Discharge planning Services  CM Consult  Post Acute Care Choice:  Home Health, Resumption of Svcs/PTA Provider Choice offered to:  Patient  DME Arranged:    DME Agency:     HH Arranged:  RN, PT, OT HH Agency:  [place]  Status of Service:  In process, will continue to follow  If discussed at Long Length of Stay Meetings, dates discussed:    Additional Comments:  Sherald Barge, RN 09/09/2015, 1:14 PM

## 2015-09-09 NOTE — Telephone Encounter (Signed)
REVIEWED. AGREE. NO ADDITIONAL RECOMMENDATIONS. 

## 2015-09-09 NOTE — Progress Notes (Signed)
PROGRESS NOTE    KALANY DIEKMANN  ZOX:[id number] DOB: 07-27-1955 DOA: 09/08/2015 PCP: Jani Gravel, MD    Brief Narrative:  60 y.o. female with medical history significant of lung cancer followed by Dr.Gorsuch, reportedly in remission, recent left femoral fracture in July 2017, Paget's disease of bone, hypertension, emphysema who presents emergency department with complaints of tachycardia. Patient was seen in the emergency department one day prior to this hospital admission where she had heart rate into the 120s. Patient reports having elevated heart rate for the past few weeks prior to hospital admission. One day prior to this admission, patient was given IV fluids in the emergency department and was subsequently discharged home. Patient reported increased upper extremity swelling status post IV fluid hydration.  Assessment & Plan:   Principal Problem:   Sepsis due to pneumonia [facility]) Active Problems:   Essential hypertension   Tachycardia   Closed fracture of left femur (HCC)   HCAP (healthcare-associated pneumonia)   1. Sepsis secondary to pneumonia, sepsis present on admission 1. Patient no longer meeting sepsis criteria 2. Leukocytosis has normalized 3. Patient reports feeling much better this morning. 4. If patient continues to improve, consider transition to by mouth antibiotics and discharged tomorrow 2. Hypertension 1. Blood pressure currently stable and controlled 2. Tolerating metoprolol (see below) 3. Tachycardia 1. Possibly related to sepsis versus history of malignancy 2. Have started patient on low-dose of metoprolol, seems to tolerate thus far 3. Continue to monitor 4. History of close fracture of the left femur 1. Seen by physical therapy with recommendations for skilled level of care. Patient declines skilled nursing 5. History of lung cancer 1. CTA chest from 08/27/2015 reviewed. Patient was noted to have worsening opacity of the right upper lobe with a somewhat  masslike portion of opacity that has increased in size since May 2017. There were other regions of more ill-defined less masslike opacities seen in the posterior right upper lobe and superior segment of the right lower lobe. Component of these may be infectious, however neoplasm cannot be excluded. 2. Per above, patient has shown clinical improvement with empiric coverage for pneumonia. 3. Have updated patient's primary oncologist. Patient is already scheduled to follow with her oncologist in the near future. Recommend repeat imaging at that time. If suspicious lesions persist or worsen at that time despite antibiotics, patient may warrant further malignancy workup. Patient and family updated and agree with plan 6. Dysphagia 1. Patient is presently dependent on tube feeds. 2. Continue tube feeds per nutrition recommendations 3. Appreciate input by speech pathology. Presently, patient remains nothing by mouth  DVT prophylaxis: Heparin subcutaneous Code Status: Full code Family Communication: Patient in room, patient's son at bedside Disposition Plan: Possible discharge in 24 hours  Consultants:     Procedures:     Antimicrobials: Anti-infectives    Start     Dose/Rate Route Frequency Ordered Stop   09/09/15 0500  vancomycin (VANCOCIN) 500 mg in sodium chloride 0.9 % 100 mL IVPB     500 mg 100 mL/hr over 60 Minutes Intravenous Every 12 hours 09/08/15 1536     09/08/15 1530  ceFEPIme (MAXIPIME) 1 g in dextrose 5 % 50 mL IVPB     1 g 100 mL/hr over 30 Minutes Intravenous Every 8 hours 09/08/15 1517 09/16/15 1359   09/08/15 1530  vancomycin (VANCOCIN) IVPB 1000 mg/200 mL premix     1,000 mg 200 mL/hr over 60 Minutes Intravenous  Once 09/08/15 1522 09/08/15 1637   09/08/15  1515  cefTRIAXone (ROCEPHIN) 1 g in dextrose 5 % 50 mL IVPB  Status:  Discontinued     1 g 100 mL/hr over 30 Minutes Intravenous Every 24 hours 09/08/15 1506 09/08/15 1517   09/08/15 1515  azithromycin (ZITHROMAX) 500  mg in dextrose 5 % 250 mL IVPB  Status:  Discontinued     500 mg 250 mL/hr over 60 Minutes Intravenous Every 24 hours 09/08/15 1506 09/08/15 1517   09/08/15 1500  vancomycin (VANCOCIN) IVPB 1000 mg/200 mL premix  Status:  Discontinued     1,000 mg 200 mL/hr over 60 Minutes Intravenous  Once 09/08/15 1458 09/08/15 1506   09/08/15 1445  piperacillin-tazobactam (ZOSYN) IVPB 3.375 g  Status:  Discontinued     3.375 g 100 mL/hr over 30 Minutes Intravenous  Once 09/08/15 1443 09/08/15 1506       Subjective: Reports feeling better this morning  Objective: Vitals:   09/09/15 1000 09/09/15 1118 09/09/15 1136 09/09/15 1323  BP: (!) 139/58 (!) 121/53 (!) 159/62 128/63  Pulse: (!) 105 97 (!) 105 95  Resp:    18  Temp:    98.2 F (36.8 C)  TempSrc:      SpO2:  97% 99% 100%  Weight:      Height:        Intake/Output Summary (Last 24 hours) at 09/09/15 1630 Last data filed at 09/09/15 1225  Gross per 24 hour  Intake                0 ml  Output              402 ml  Net             -402 ml   Filed Weights   09/08/15 1149 09/08/15 1822  Weight: 51.7 kg (114 lb) 53.1 kg (117 lb 1.6 oz)    Examination:  General exam: Appears calm and comfortable, Sitting in chair  Respiratory system: Clear to auscultation. Respiratory effort normal. Cardiovascular system: S1 & S2 heard, RRR Gastrointestinal system: Abdomen is nondistended, soft and nontender. No organomegaly or masses felt. Normal bowel sounds heard. Central nervous system: Alert and oriented. No focal neurological deficits. Extremities: Symmetric 5 x 5 power. Skin: Diffuse rash across face, perfused Psychiatry: Judgement and insight appear normal. Mood & affect appropriate.   Data Reviewed: I have personally reviewed following labs and imaging studies  CBC:  Recent Labs Lab 09/07/15 1406 09/08/15 1230 09/09/15 0700  WBC 11.1* 14.6* 10.5  NEUTROABS 9.6* 12.7*  --   HGB 10.0* 10.0* 9.8*  HCT 31.2* 31.2* 30.5*  MCV 95.4  96.9 96.2  PLT 295 PLATELET CLUMPS NOTED ON SMEAR, COUNT APPEARS ADEQUATE 778   Basic Metabolic Panel:  Recent Labs Lab 09/07/15 1406 09/08/15 1230 09/09/15 0700  NA 134* 134* 135  K 4.5 4.3 3.7  CL 99* 98* 103  CO2 '27 25 25  '$ GLUCOSE 145* 116* 99  BUN 38* 35* 29*  CREATININE 0.89 0.87 0.75  CALCIUM 8.5* 8.5* 8.4*   GFR: Estimated Creatinine Clearance: 56.4 mL/min (by C-G formula based on SCr of 0.8 mg/dL). Liver Function Tests:  Recent Labs Lab 09/07/15 1406 09/09/15 0700  AST 40 37  ALT 15 13*  ALKPHOS 64 55  BILITOT 0.6 0.8  PROT 5.8* 5.1*  ALBUMIN 3.0* 2.7*   No results for input(s): LIPASE, AMYLASE in the last 168 hours. No results for input(s): AMMONIA in the last 168 hours. Coagulation Profile:  Recent Labs Lab 09/08/15  1230  INR 0.97   Cardiac Enzymes: No results for input(s): CKTOTAL, CKMB, CKMBINDEX, TROPONINI in the last 168 hours. BNP (last 3 results) No results for input(s): PROBNP in the last 8760 hours. HbA1C: No results for input(s): HGBA1C in the last 72 hours. CBG: No results for input(s): GLUCAP in the last 168 hours. Lipid Profile: No results for input(s): CHOL, HDL, LDLCALC, TRIG, CHOLHDL, LDLDIRECT in the last 72 hours. Thyroid Function Tests: No results for input(s): TSH, T4TOTAL, FREET4, T3FREE, THYROIDAB in the last 72 hours. Anemia Panel: No results for input(s): VITAMINB12, FOLATE, FERRITIN, TIBC, IRON, RETICCTPCT in the last 72 hours. Sepsis Labs:  Recent Labs Lab 09/08/15 1230  PROCALCITON 1.12    Recent Results (from the past 240 hour(s))  Culture, blood (routine x 2) Call MD if unable to obtain prior to antibiotics being given     Status: None (Preliminary result)   Collection Time: 09/08/15  3:23 PM  Result Value Ref Range Status   Specimen Description PORTA CATH  Final   Special Requests BOTTLES DRAWN AEROBIC AND ANAEROBIC 6CC  Final   Culture NO GROWTH < 24 HOURS  Final   Report Status PENDING  Incomplete    Culture, blood (routine x 2) Call MD if unable to obtain prior to antibiotics being given     Status: None (Preliminary result)   Collection Time: 09/08/15  3:31 PM  Result Value Ref Range Status   Specimen Description BLOOD LEFT WRIST  Final   Special Requests BOTTLES DRAWN AEROBIC ONLY 6CC  Final   Culture NO GROWTH < 24 HOURS  Final   Report Status PENDING  Incomplete     Radiology Studies: No results found.  Scheduled Meds: . acetaminophen  650 mg Oral Once  . ceFEPime (MAXIPIME) IV  1 g Intravenous Q8H  . feeding supplement (JEVITY 1.2 CAL)  360 mL Per Tube QID  . heparin  5,000 Units Subcutaneous Q8H  . metoprolol tartrate  12.5 mg Oral BID  . predniSONE  20 mg Oral Q breakfast  . sodium chloride flush  3 mL Intravenous Q12H  . vancomycin  500 mg Intravenous Q12H   Continuous Infusions: . sodium chloride 75 mL/hr at 09/09/15 1340     LOS: 1 day   Ruble Pumphrey, Orpah Melter, MD [facility] Pager [phone]  If 7PM-7AM, please contact night-coverage [email or web address] Password [email or web address] 09/09/2015, 4:30 PM

## 2015-09-09 NOTE — Evaluation (Signed)
Clinical/Bedside Swallow Evaluation Patient Details  Name: Megan Sims MRN: [id number] Date of Birth: 10/26/1955  Today's Date: 09/09/2015 Time: SLP Start Time (ACUTE ONLY): 9924 SLP Stop Time (ACUTE ONLY): 1326 SLP Time Calculation (min) (ACUTE ONLY): 21 min  Past Medical History:  Past Medical History:  Diagnosis Date  . Anemia 08/03/2015  . Cancer [facility])    malignant neoplasm of unknown origin  . Cerebral aneurysm 2 brain surgeries 96 or 97  . Chronic neck pain 09/24/2014  . Diarrhea   . Emphysema of lung ([place]) 05/05/2015  . Hypertension   . Hyponatremia   . Left knee pain   . Left leg weakness 06/2015  . Lung abnormality   . Lung cancer ([place]) 10/07/2014  . Malignant neoplasm of unknown origin ([place])   . Migraine   . Nicotine dependence 09/24/2014  . Oral thrush 09/24/2014  . Paget disease of bone   . Pleurisy   . Renal insufficiency    Patient states " no kidney problems  . S/P biopsy    of throat per patient.  . Skin ulcer ([employer]) 11/18/2014  . 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 CEREBRAL ANEURYSM REPAIR  96 or 97  . CHEST TUBE INSERTION    . COLONOSCOPY     10 years ago in [place]   . ESOPHAGOGASTRODUODENOSCOPY  06/16/2015   Dr. Gala Romney: edentulous cricopharyngeus, esophageal plaques, biopsy consistent with candida  . ESOPHAGOGASTRODUODENOSCOPY N/A 06/16/2015   Procedure: ESOPHAGOGASTRODUODENOSCOPY (EGD);  Surgeon: Daneil Dolin, MD;  Location[facility];  Service: Endoscopy;  Laterality: N/A;  . ESOPHAGOGASTRODUODENOSCOPY N/A 07/06/2015   Procedure: ESOPHAGOGASTRODUODENOSCOPY (EGD);  Surgeon: Danie Binder, MD;  Location: [facility] ENDO SU[facility];  Service: Endoscopy;  Laterality: N/A;  . INTRAMEDULLARY (IM) NAIL INTERTROCHANTERIC Left 08/07/2015   Procedure: INTRAMEDULLARY (IM) NAIL INTERTROCHANTRIC;  Surgeon: Melrose Nakayama, MD;  Location: [facility] ORS;  Service: Orthopedics;  Laterality: Left;  .  LARYNGOSCOPY  Jun 22, 2015   [facility], Dr. Erik Obey: normal oropharynx, no lesions, mobile vocal cords with good airway.  . PEG PLACEMENT N/A 07/06/2015   Procedure: PERCUTANEOUS ENDOSCOPIC GASTROSTOMY (PEG) PLACEMENT;  Surgeon: Danie Binder, MD;  Location: [facility];  Service: Endoscopy;  Laterality: N/A;  . TUBAL LIGATION     HPI:  Pt is a 60 y.o.femalewith medical history significant of lung cancer followed by Dr.Gorsuch, reportedly in remission, recent left femoral fracture in July 2017, Paget's disease of bone, hypertension, emphysema who presents emergency department with complaints of tachycardia. Patient was seen in the emergency department one day prior to this hospital admission where she had heart rate into the 120s. Patient reports having elevated heart rate for the past few weeks prior to hospital admission. Pt with extensive hx of dysphagia s/p PEG placement. ST to evaluate swallow function    Assessment / Plan / Recommendation Clinical Impression  Of note, pt with extensive hx of dysphagia s/p PEG placement. Pt has undergone flexible laryngoscopy showing normal oropharynx, no lesions, mobile vocal folds with good airway. Most recent MBS (07/05/15): "severe pharyngo-cervical esophageal dysphagia with eventual moderate aspiration of all PO which was inconsistently sensed. Question of contribution of radiation fibrosis to pt's severe dysphagia causing significant exacerbation of some baseline deficits *diagnosed before chemoradiation completed in Sept and November 2016."  This date pt continues with clinical signs and symptoms of poor airway protection. Trialed ice chips following oral care. Pt with delayed throat clearing,  coughing, and wet vocal quality. RN reports family noncompliance with NPO measures.  SLP educated to pt regarding severity of swallow function and significant risk for recurrent aspiration PNA. Recommend continue NPO with tube feeds and medicines via PEG. Pt okay for  ice chips following oral care. ST to continue to monitor and educate for laryngeal strength exercises.     Aspiration Risk  Severe aspiration risk    Diet Recommendation     Medication Administration: Via alternative means    Other  Recommendations Oral Care Recommendations: Oral care prior to ice chip/H20;Oral care QID   Follow up Recommendations  24 hour supervision/assistance    Frequency and Duration min 1 x/week  1 week       Prognosis Prognosis for Safe Diet Advancement: Guarded Barriers to Reach Goals: Severity of deficits      Swallow Study   General Date of Onset: 09/08/15 HPI: Pt is a 60 y.o.femalewith medical history significant of lung cancer followed by Dr.Gorsuch, reportedly in remission, recent left femoral fracture in July 2017, Paget's disease of bone, hypertension, emphysema who presents emergency department with complaints of tachycardia. Patient was seen in the emergency department one day prior to this hospital admission where she had heart rate into the 120s. Patient reports having elevated heart rate for the past few weeks prior to hospital admission. Pt with extensive hx of dysphagia s/p PEG placement. ST to evaluate swallow function  Type of Study: Bedside Swallow Evaluation Previous Swallow Assessment: MBS  Diet Prior to this Study: NPO;PEG tube Temperature Spikes Noted: No Respiratory Status: Room air History of Recent Intubation: No Behavior/Cognition: Alert;Pleasant mood Oral Cavity Assessment: Dry Oral Care Completed by SLP: Yes Oral Cavity - Dentition: Missing dentition Vision: Functional for self-feeding Self-Feeding Abilities: Able to feed self Patient Positioning: Upright in chair Baseline Vocal Quality: Low vocal intensity Volitional Cough: Weak;Congested Volitional Swallow: Able to elicit    Oral/Motor/Sensory Function Overall Oral Motor/Sensory Function: Within functional limits   Ice Chips Ice chips: Impaired Presentation:  Spoon Pharyngeal Phase Impairments: Suspected delayed Swallow;Multiple swallows;Wet Vocal Quality;Decreased hyoid-laryngeal movement;Throat Clearing - Immediate;Throat Clearing - Delayed;Cough - Immediate;Cough - Delayed   Thin Liquid Thin Liquid: Not tested    Nectar Thick Nectar Thick Liquid: Not tested   Honey Thick Honey Thick Liquid: Not tested   Puree Puree: Not tested   Solid   GO   Solid: Not tested       Arvil Chaco MA, CCC-SLP Acute Care Speech Language Pathologist    Arvil Chaco E 09/09/2015,1:42 PM

## 2015-09-09 NOTE — Telephone Encounter (Signed)
Called TO DISCUSS CONCERNS. ENSURE BOLUS FEEDS. D/C JEVITY DUE TO LOOSE STOOLS.

## 2015-09-09 NOTE — Evaluation (Signed)
Physical Therapy Evaluation Patient Details Name: Megan Sims MRN: [id number] DOB: Oct 20, 1955 Today's Date: 09/09/2015   History of Present Illness  60 y.o. female with medical history significant of lung cancer followed by Dr.Gorsuch, reportedly in remission, recent left femoral fracture in July 2017 [facility]), Paget's disease of bone, hypertension, emphysema who presents emergency department with complaints of tachycardia. Patient was seen in the emergency department one day prior to this hospital admission where she had heart rate into the 120s. Patient reports having elevated heart rate for the past few weeks prior to hospital admission. One day prior to this admission, patient was given IV fluids in the emergency department and was subsequently discharged home. Patient reported increased upper extremity swelling status post IV fluid hydration.  In the emergency department, patient was noted have chest x-ray findings worrisome for possible pneumonia. Patient was also noted to have a white blood count of 14,000 with heart rate as high as 116 bpm. Patient was started on empiric antibiotics.  Dx: sepsis with PNA.  PMH: Anemia, CA, cerebral aneurysm s/p repair in in 96 or 97, chronic neck pain, emphysema of lung, HTN, hyponatremia, L knee pain, L LE weakness, Lung CA, Paget disease of bone, renal insufficiency, Stevens-Johnson disease, IM nail of L LE, PEG placement 07/06/2015, tobacco abuse.   Clinical Impression  Pt received sitting up in the chair, son present, and pt is agreeable to PT evaluation.  Pt states that she requires assistance for ADL's, but can ambulate short distances in her room with her RW - however, it is very effortful.  During She remains PWB on the L LE due to recent L LE fx, and during PT eval today, she require Mod A for sit<>stand, and stand pivot transfers due to weakness, as well as poor safety awareness.  Pt was able to ambulate 46f with RW with Min A, however this was limited due to  fatigue.  She has had 6 admissions within the last 6 months including one due to a fall with resultant L LE fx.  Therefore, at this point, she is recommended to d/c to SNF, which I was not able to discuss with them today.      Follow Up Recommendations SNF    Equipment Recommendations  None recommended by PT    Recommendations for Other Services       Precautions / Restrictions Precautions Precautions: Fall Precaution Comments: Recent fall with L LE fx and repair on 08/06/2015 Restrictions Weight Bearing Restrictions: Yes LLE Weight Bearing: Partial weight bearing      Mobility  Bed Mobility Overal bed mobility:  (Not assessed, pt sitting up in the chair)                Transfers Overall transfer level: Needs assistance Equipment used: Rolling walker (2 wheeled) Transfers: Sit to/from SOmnicareSit to Stand: Mod assist (vc's for technique, and assistance for power up to stand. ) Stand pivot transfers: Mod assist (Pt becomes anxious, and requires vc's for safety with hand placement and getting lined up with the chair prior to sitting . )          Ambulation/Gait Ambulation/Gait assistance: Min assist Ambulation Distance (Feet): 10 Feet Assistive device: Rolling walker (2 wheeled) Gait Pattern/deviations: Step-to pattern;Trunk flexed   Gait velocity interpretation: <1.8 ft/sec, indicative of risk for recurrent falls General Gait Details: PWB on L LE due to recent fx - pt states she has not been back to the orthopedist to know if  she can lift the restriction.  Distance limited due to UE fatigue.   Stairs            Wheelchair Mobility    Modified Rankin (Stroke Patients Only)       Balance Overall balance assessment: Needs assistance Sitting-balance support: Bilateral upper extremity supported Sitting balance-Leahy Scale: Good     Standing balance support: Bilateral upper extremity supported Standing balance-Leahy Scale: Fair                                Pertinent Vitals/Pain Pain Assessment: 0-10 Pain Score: 7  Pain Location: L LE Pain Descriptors / Indicators: Dull Pain Intervention(s): Limited activity within patient's tolerance    Home Living   Living Arrangements: Spouse/significant other Available Help at Discharge: Available PRN/intermittently (mother in law, son, aunt, and sister in law.  Family assists with ADL's and housekeeping) Type of Home: House Home Access: Ramped entrance     Home Layout: One level Home Equipment: Wheelchair - [employer] - 2 wheels;Bedside commode;Hospital bed      Prior Function     Gait / Transfers Assistance Needed: Pt states she uses the RW to ambulaed a few feet (~79f) but she can do it mod (I).  ADL's / Homemaking Assistance Needed: Mother in law assists with dressing, and bathing.          Hand Dominance   Dominant Hand: Right    Extremity/Trunk Assessment   Upper Extremity Assessment: Generalized weakness           Lower Extremity Assessment: Generalized weakness         Communication   Communication: No difficulties  Cognition Arousal/Alertness: Awake/alert Behavior During Therapy: WFL for tasks assessed/performed Overall Cognitive Status: Within Functional Limits for tasks assessed                      General Comments      Exercises        Assessment/Plan    PT Assessment Patient needs continued PT services  PT Diagnosis Difficulty walking;Abnormality of gait;Generalized weakness   PT Problem List Decreased strength;Decreased activity tolerance;Decreased balance;Decreased mobility;Decreased knowledge of use of DME;Decreased safety awareness;Decreased knowledge of precautions;Cardiopulmonary status limiting activity;Decreased range of motion  PT Treatment Interventions DME instruction;Gait training;Functional mobility training;Therapeutic activities;Therapeutic exercise;Balance training;Patient/family  education   PT Goals (Current goals can be found in the Care Plan section) Acute Rehab PT Goals Patient Stated Goal: To get stronger PT Goal Formulation: With patient Time For Goal Achievement: 09/16/15 Potential to Achieve Goals: Fair    Frequency Min 5X/week   Barriers to discharge Decreased caregiver support Pt states that she is at home alone from 1pm-3pm during the day.     Co-evaluation               End of Session Equipment Utilized During Treatment: Gait belt Activity Tolerance: Patient limited by fatigue Patient left: in chair;with family/visitor present Nurse Communication: Mobility status    Functional Assessment Tool Used: [employer]"6-clicks"  Functional Limitation: Mobility: Walking and moving around Mobility: Walking and Moving Around Current Status ([phone]: At least 40 percent but less than 60 percent impaired, limited or restricted Mobility: Walking and Moving Around Goal Status ([phone]: At least 20 percent but less than 40 percent impaired, limited or restricted    Time: 1107-1130 PT Time Calculation (min) (ACUTE ONLY): 23 min   Charges:   PT  Evaluation $PT Eval Moderate Complexity: 1 Procedure PT Treatments $Gait Training: 8-22 mins   PT G Codes:   PT G-Codes **NOT FOR INPATIENT CLASS** Functional Assessment Tool Used: [employer] "6-clicks"  Functional Limitation: Mobility: Walking and moving around Mobility: Walking and Moving Around Current Status [phone]): At least 40 percent but less than 60 percent impaired, limited or restricted Mobility: Walking and Moving Around Goal Status [phone]): At least 20 percent but less than 40 percent impaired, limited or restricted    Beth Adhrit Krenz, PT, DPT X: [phone]

## 2015-09-09 NOTE — Telephone Encounter (Signed)
VM from [place], stating pt does not want to change formula to Ensure so at this point they will keep her on Jevity 1.2.

## 2015-09-09 NOTE — Progress Notes (Addendum)
Initial Nutrition Assessment  DOCUMENTATION CODES:  Not applicable  INTERVENTION:  Recommend diet downgrade NPO with swallow/SLP eval. Son (Megan Sims)  requests being called and being present for eval. Apparently he lives <5 minutes away. #from chart:[phone]  TF regimen: 360 cc Jevity 1.2. 8:00 am, 1200 pm, 4: pm 8 pm  Tube feeding regimen provides 1728 kcal (102% of needs), 80 grams of protein, and 1162 ml of H2O.   Receivng ~10 kcals from d5 infusion  Minimal flushes for tube patency is 30 cc before and after, however if pt is not on IVF Flush with 90 ccs before and after each feed.   NUTRITION DIAGNOSIS:  Swallowing difficulty related to Unknown etiology as evidenced by  ST eval, PEG tube reliance.  GOAL:  Patient will meet greater than or equal to 90% of their needs  MONITOR:  Diet advancement, Labs, TF tolerance, I & O's, Swallow eval  REASON FOR ASSESSMENT:  Consult Enteral/tube feeding initiation and management  ASSESSMENT:  60 y/o female PMHx anemia, dysphagia s/p PEG, HTN, Emphysema, Lung cancer. Presented with tachycardia. Worked up for PNA and met sepsis criteria. CTA of chest has masslike density.   Pt was seen by this RD 2 months ago when her PEG was placed (6/13). The indication at that time was pharyngoesophageal dysphagia of unknown etiology. Son in room today states it may have been related to prior cancer treatment.   Since that time, pt reports being MAINLY NPO. Pt initially reported chewing on foods to get the flavor and then spitting them out. Son later stated that he told his mother to chew and swallow small amounts apparently because he didn't want her to lose her swallow function, but told her not to "over indulge" until she could be re-evaluated by ST. Pt admits to eating small amounts of food.   Pt has been trying to be reevaluated by ST for a while now as noted on 7/12 Heme/ONC md wanted to get her re-evaluated. However this was not completed due to  femur fracture and brief rehab stint  Pt's TF regimen was 1.5 cans of Jevity 1.2 4x a day for total of 6 cans. Conflicting reports of flushes were given. Last statement was that pt was flushing with 60 cc before and after each feed for extra 480 ccs. Pt reports recently she has not been able to infuse the 4x due to her health complications. Son reports that he was told by a physician recently to put Ensure/Boost through tube if needed. A different physician told him to put juice/soda in tube. Reiterated this is NOT what should be done as this can easily cause clog. He says that is what he thought too. RD will order TF recs to mirror home regimen  Home tf regimen provided: 1706 kcals, 79 g Pro, 1146 ml fluid w/ additional 480 from flushes. This would meet estimated Pro/kcal needs, but not fluid needs, which coincides with her reported issues with dehydration   Son reports she has a SLP eval scheduled on the 30th, but asks RD if it could be moved up. Given pt's po intake w/o being cleared by ST, question pna of aspiration origin.  Pt should likely be evaluated while in hospital.   Son requests to be called and being present for eval. Apparently he lives <5 minutes away.  He says pt "does not really know all that is going on" and he takes care of her and is a far better point of contact as to what  is truly taking place.   Wt wise, pt appears to have been able to maintain wt. On current weight. She was 115 at time of peg placement. Pt/Son report she was 140-145 lbs before her cancer dx ~ 1 year ago. Per EMR documentation, at this time last year she was weighed at 128-131 lbs. This is not significant for time interval  Pt denies any n/v/c/d. She did have diarrhea a few days after her tf was changed to jev 1.5 because the snf did not have the 1.2 available. Diarrhea resolved when placed back on 1.2.   Medications: Ensure Enlive (will d/c, should be npo), prednisone, Vanc, NS, Cef in D5. NFPE: Appears WDL, no  wasting Labs reviewed: Albumin: 10.7, WBC: 10.5, CBGs have decreased since arrival.   Recent Labs Lab 09/07/15 1406 09/08/15 1230 09/09/15 0700  NA 134* 134* 135  K 4.5 4.3 3.7  CL 99* 98* 103  CO2 _0 BUN 38* 35* 29*  CREATININE 0.89 0.87 0.75  CALCIUM 8.5* 8.5* 8.4*  GLUCOSE 145* 116* 99   Diet Order:  Diet NPO time specified  Skin: Rash  Last BM:  8/16  Height:  Ht Readings from Last 1 Encounters:  09/08/15 _1  (1.549 m)   Weight:  Wt Readings from Last 1 Encounters:  09/08/15 117 lb 1.6 oz (53.1 kg)   Wt Readings from Last 10 Encounters:  09/08/15 117 lb 1.6 oz (53.1 kg)  09/07/15 114 lb (51.7 kg)  09/02/15 114 lb 12.8 oz (52.1 kg)  08/27/15 120 lb (54.4 kg)  08/19/15 119 lb (54 kg)  08/07/15 119 lb (54 kg)  08/06/15 120 lb 3.2 oz (54.5 kg)  08/03/15 121 lb (54.9 kg)  07/30/15 120 lb (54.4 kg)  07/27/15 119 lb (54 kg)   Ideal Body Weight:  47.72 kg  BMI:  Body mass index is 22.13 kg/m.  Estimated Nutritional Needs:  Kcal:  1500-1700 kcals (28-32 kcal/kg bw) Protein:  58-74 g Pro (1.1-1.4 g/kg bw)) Fluid:  >1.7 liters (30 ml/kg bw)  EDUCATION NEEDS:  No education needs identified at this time  Burtis Junes RD, LDN, [facility] Pager: [phone] 09/09/2015 11:52 AM

## 2015-09-09 NOTE — Telephone Encounter (Signed)
T/C from [place] at [facility] and she asked how did Dr. Oneida Alar want the Ensure given.  I spoke to Dr. Oneida Alar who said Bolus is fine.  They are asking for an order.   I told them Dr. Oneida Alar said she will talk with them and she said number to call is [phone].

## 2015-09-10 ENCOUNTER — Telehealth: Payer: Self-pay | Admitting: *Deleted

## 2015-09-10 ENCOUNTER — Other Ambulatory Visit: Payer: Self-pay | Admitting: Hematology and Oncology

## 2015-09-10 DIAGNOSIS — C3491 Malignant neoplasm of unspecified part of right bronchus or lung: Secondary | ICD-10-CM

## 2015-09-10 DIAGNOSIS — J438 Other emphysema: Secondary | ICD-10-CM

## 2015-09-10 DIAGNOSIS — C3492 Malignant neoplasm of unspecified part of left bronchus or lung: Principal | ICD-10-CM

## 2015-09-10 LAB — COMPREHENSIVE METABOLIC PANEL
ALBUMIN: 2.6 g/dL — AB (ref 3.5–5.0)
ALT: 12 U/L — ABNORMAL LOW (ref 14–54)
ANION GAP: 5 (ref 5–15)
AST: 37 U/L (ref 15–41)
Alkaline Phosphatase: 56 U/L (ref 38–126)
BILIRUBIN TOTAL: 0.8 mg/dL (ref 0.3–1.2)
BUN: 25 mg/dL — AB (ref 6–20)
CHLORIDE: 105 mmol/L (ref 101–111)
CO2: 26 mmol/L (ref 22–32)
Calcium: 8.3 mg/dL — ABNORMAL LOW (ref 8.9–10.3)
Creatinine, Ser: 0.68 mg/dL (ref 0.44–1.00)
GFR calc Af Amer: >60 mL/min (ref >60)
GFR calc non Af Amer: >60 mL/min (ref >60)
GLUCOSE: 95 mg/dL (ref 65–99)
POTASSIUM: 3.8 mmol/L (ref 3.5–5.1)
SODIUM: 136 mmol/L (ref 135–145)
TOTAL PROTEIN: 4.9 g/dL — AB (ref 6.5–8.1)

## 2015-09-10 MED ORDER — METOPROLOL TARTRATE 25 MG PO TABS: 12.5000 mg | ORAL_TABLET | Freq: Two times a day (BID) | ORAL | 0 refills | Status: AC

## 2015-09-10 MED ORDER — HEPARIN SOD (PORK) LOCK FLUSH 100 UNIT/ML IV SOLN
500.0000 [IU] | INTRAVENOUS | Status: DC | PRN
  Filled 2015-09-10: qty 5

## 2015-09-10 MED ORDER — AMOXICILLIN-POT CLAVULANATE 250-62.5 MG/5ML PO SUSR
500.0000 mg | Freq: Three times a day (TID) | ORAL | Status: AC
Start: 2015-09-10 — End: 2015-09-17

## 2015-09-10 NOTE — Discharge Summary (Signed)
Physician Discharge Summary  AGUEDA HOUPT WCB:[id number] DOB: May 28, 1955 DOA: 09/08/2015  PCP: Jani Gravel, MD  Admit date: 09/08/2015 Discharge date: 09/10/2015  Admitted From: Home Disposition:  Home  Recommendations for Outpatient Follow-up:  1. Follow up with PCP in 1-2 weeks 2. Follow up with Dr. Alvy Bimler as scheduled 3. Recommend repeat cxr or chest CT on follow up with Oncology  Discharge Condition:Improved CODE STATUS:Full Diet recommendation: NPO - PEG tube feeds   Brief/Interim Summary: 60 y.o.femalewith medical history significant of lung cancer followed by Dr.Gorsuch, reportedly in remission, recent left femoral fracture in July 2017, Paget's disease of bone, hypertension, emphysema who presents emergency department with complaints of tachycardia. Patient was seen in the emergency department one day prior to this hospital admission where she had heart rate into the 120s. Patient reports having elevated heart rate for the past few weeks prior to hospital admission. One day prior to this admission, patient was given IV fluids in the emergency department and was subsequently discharged home. Patient reported increased upper extremity swelling status post IV fluid hydration.  1. Sepsis secondary to pneumonia, sepsis present on admission 1. Sepsis resolved 2. Leukocytosis has normalized 3. Patient reports feeling much better by time of discharge. 4. Will transition pt to augmentin per tube on discharge x 7 more days of tx 2. Hypertension 1. Blood pressure remained overall stable and controlled 2. Tolerating metoprolol (see below) 3. Tachycardia 1. Possibly related to sepsis versus history of malignancy 2. Have started patient on low-dose of metoprolol, seems to tolerate thus far 4. History of close fracture of the left femur 1. Seen by physical therapy with recommendations for skilled level of care. Patient declines skilled nursing 5. History of lung cancer 1. CTA chest from  08/27/2015 reviewed. Patient was noted to have worsening opacity of the right upper lobe with a somewhat masslike portion of opacity that has increased in size since May 2017. There were other regions of more ill-defined less masslike opacities seen in the posterior right upper lobe and superior segment of the right lower lobe. Component of these may be infectious, however neoplasm cannot be excluded. 2. Per above, patient has shown clinical improvement with empiric coverage for pneumonia. 3. Have updated patient's primary oncologist. Patient is already scheduled to follow with her oncologist in the near future. Recommend repeat imaging at that time. If suspicious lesions persist or worsen at that time despite antibiotics, patient may warrant further malignancy workup. Patient and family updated and agree with plan 6. Dysphagia 1. Patient is presently dependent on tube feeds. 2. Continue tube feeds per nutrition recommendations 3. Appreciate input by speech pathology. Presently, patient remains nothing by mouth  Discharge Diagnoses:  Principal Problem:   Sepsis due to pneumonia [facility]) Active Problems:   Essential hypertension   Tachycardia   Closed fracture of left femur ([place])   HCAP (healthcare-associated pneumonia)    Discharge Instructions     Medication List    STOP taking these medications   morphine 20 MG/ML concentrated solution Commonly known as:  ROXANOL   omeprazole 2 mg/mL Susp Commonly known as:  PRILOSEC     TAKE these medications   albuterol 108 (90 Base) MCG/ACT inhaler Commonly known as:  PROVENTIL HFA;VENTOLIN HFA Inhale 1-2 puffs into the lungs every 6 (six) hours as needed for wheezing or shortness of breath.   amLODipine 10 MG tablet Commonly known as:  NORVASC Take 1 tablet (10 mg total) by mouth daily. What changed:  how much to  take   amoxicillin-clavulanate 250-62.5 MG/5ML suspension Commonly known as:  AUGMENTIN Place 10 mLs (500 mg total) into  feeding tube 3 (three) times daily.   feeding supplement (JEVITY 1.2 CAL) Liqd Place 237 mLs into feeding tube every 3 (three) hours. What changed:  Another medication with the same name was removed. Continue taking this medication, and follow the directions you see here.   HYDROcodone-acetaminophen 7.5-325 mg/15 ml solution Commonly known as:  HYCET Take 15 mLs by mouth every 6 (six) hours as needed.   HYDROcodone-acetaminophen 5-325 MG tablet Commonly known as:  NORCO/VICODIN Take 1-2 tablets by mouth every 6 (six) hours as needed for moderate pain.   metoprolol tartrate 25 MG tablet Commonly known as:  LOPRESSOR Take 0.5 tablets (12.5 mg total) by mouth 2 (two) times daily.   predniSONE 20 MG tablet Commonly known as:  DELTASONE Take 1 tablet (20 mg total) by mouth daily with breakfast.   sucralfate 1 GM/10ML suspension Commonly known as:  CARAFATE Take 10 mLs (1 g total) by mouth 4 (four) times daily -  with meals and at bedtime.   zolpidem 10 MG tablet Commonly known as:  AMBIEN Take 10 mg by mouth at bedtime as needed for sleep.      Follow-up Information    Jani Gravel, MD. Schedule an appointment as soon as possible for a visit in 1 week(s).   Specialty:  Internal Medicine Contact information: [address] [place] [address] [phone]        Heath Lark, MD Follow up on 09/22/2015.   Specialty:  Hematology and Oncology Why:  As already scheduled Contact information: [place] [address] [phone]          Allergies  Allergen Reactions  . Hydrocodone-Acetaminophen Swelling    Increases secretions and sensation of throat swelling  . Tetracyclines & Related     PILLESOPHAGITIS  . Tramadol     SWELLING    Consultations:  Updated Dr. Alvy Bimler  Procedures/Studies: Dg Chest 2 View  Result Date: 09/07/2015 CLINICAL DATA:  Tachycardia.  Cancer of unknown origin. EXAM: CHEST  2 VIEW COMPARISON:  Chest x-ray  and chest CT 08/27/2015 FINDINGS: Heart size within normal limits.  Negative for heart failure. Port-A-Cath tip in the proximal SVC unchanged. Progressive right middle lobe consolidation with volume loss. Right perihilar infiltrate seen on the prior studies slightly more prominent. Possible pneumonia. No pleural effusion. IMPRESSION: Progressive right middle lobe consolidation and volume loss. Progressive right perihilar airspace disease, possible pneumonia. Electronically Signed   By: Franchot Gallo M.D.   On: 09/07/2015 14:50   Dg Chest 2 View  Result Date: 08/27/2015 CLINICAL DATA:  Productive cough.  History of lung carcinoma EXAM: CHEST  2 VIEW COMPARISON:  August 09, 2015 FINDINGS: Port-A-Cath tip is in the superior vena cava. No pneumothorax. Scarring in the right mid lung is stable. Apparent nipple shadow on the left is stable. There is no frank edema or consolidation. No new opacity. Heart size and pulmonary vascularity are normal. No adenopathy. There is atherosclerotic calcification in the aorta. No bone lesions. IMPRESSION: Scarring right mid lung. No edema or consolidation. No adenopathy evident by radiography. Nipple shadow on the left. Port-A-Cath tip in superior vena cava without pneumothorax. There is aortic atherosclerosis. Electronically Signed   By: Lowella Grip III M.D.   On: 08/27/2015 10:37   Dg Chest 2 View  Result Date: 08/19/2015 CLINICAL DATA:  Lung cancer with COPD.  Lower  extremity swelling. EXAM: CHEST  2 VIEW COMPARISON:  08/06/2015. CT scan 08/02/2015. Chest x-ray 06/14/2015. FINDINGS: Mild asymmetric elevation of the right hemidiaphragm is stable. Scarring in the right mid lung is again noted. Likely a residual component of right middle lobe collapse. Left lung is clear. Nodular density overlying the left lung base suggests nipple shadow, as seen previously. Left Port-A-Cath tip is positioned at the proximal SVC level. IMPRESSION: Stable.  No new or progressive findings.  Electronically Signed   By: Misty Stanley M.D.   On: 08/19/2015 17:56  Ct Angio Chest Pe W Or Wo Contrast  Result Date: 08/27/2015 CLINICAL DATA:  Tachycardia.  No chest pain. EXAM: CT ANGIOGRAPHY CHEST WITH CONTRAST TECHNIQUE: Multidetector CT imaging of the chest was performed using the standard protocol during bolus administration of intravenous contrast. Multiplanar CT image reconstructions and MIPs were obtained to evaluate the vascular anatomy. CONTRAST:  100 mL of Isovue 370 COMPARISON:  August 02, 2015 FINDINGS: The trachea and mainstem bronchi are normal. No pneumothorax. The right middle collapse on the previous study has resolved. There is worsening opacity in the central right upper lobe. This has been present since May of 2017 but is worsened in appearance in the interval. The most confluent portion of the opacity measures at least 3.4 by 1.6 cm today versus 1.6 by 1.0 cm previously. There are also some patchy opacities more peripherally some of which are nodular such as on series 7, image 62 and image 60. There is increasing opacity posteriorly in the right upper lobe on image 46, not seen previously. There is also patchy opacity in the superior segment of the right lower lobe such as on series 7, image 55 with a nodular component. A nodule in the anterior right upper lobe on image 45 is a little larger in the interval. No suspicious nodules, masses, or infiltrates on the left. A left Port-A-Cath is identified. No adenopathy. The thoracic aorta is non aneurysmal with no dissection. Coronary artery calcifications are identified. The heart is unchanged. No effusions. Evaluation of the pulmonary arteries in the right lower lobe is limited due to respiratory motion. Within this limitation, no emboli are identified. Evaluation of the upper abdomen is limited with no acute interval changes. A PEG tube is identified. Paget's disease seen in the spine. No other bony changes. Review of the MIP images confirms  the above findings. IMPRESSION: 1. Evaluation for emboli is limited in the right lower lobe due to respiratory motion. Within this limitation, no pulmonary emboli are identified. 2. Worsening opacity in the right upper lobe. A confluent somewhat masslike portion of opacity as seen on axial image 56 is larger in the interval and has been present since at least May of 2017. Other regions of more ill defined less masslike opacity are seen in the posterior right upper lobe and superior segment of the right lower lobe. There is also some scattered nodularity. While a component of the interval worsening could be infectious, neoplasm is not excluded, given the interval enlargement of the right upper lobe masslike component as well as multiple small nodular components described above. Electronically Signed   By: Dorise Bullion III M.D   On: 08/27/2015 18:06   US Venous Img Lower Unilateral Left  Result Date: 08/19/2015 CLINICAL DATA:  Left femur surgery on 07/2015, now with pain and edema. History of smoking. Evaluate for DVT. EXAM: LEFT LOWER EXTREMITY VENOUS DOPPLER ULTRASOUND TECHNIQUE: Gray-scale sonography with graded compression, as well as color Doppler and  duplex ultrasound were performed to evaluate the lower extremity deep venous systems from the level of the common femoral vein and including the common femoral, femoral, profunda femoral, popliteal and calf veins including the posterior tibial, peroneal and gastrocnemius veins when visible. The superficial great saphenous vein was also interrogated. Spectral Doppler was utilized to evaluate flow at rest and with distal augmentation maneuvers in the common femoral, femoral and popliteal veins. COMPARISON:  None. FINDINGS: Contralateral Common Femoral Vein: Respiratory phasicity is normal and symmetric with the symptomatic side. No evidence of thrombus. Normal compressibility. Common Femoral Vein: No evidence of thrombus. Normal compressibility, respiratory  phasicity and response to augmentation. Saphenofemoral Junction: No evidence of thrombus. Normal compressibility and flow on color Doppler imaging. Profunda Femoral Vein: No evidence of thrombus. Normal compressibility and flow on color Doppler imaging. Femoral Vein: No evidence of thrombus. Normal compressibility, respiratory phasicity and response to augmentation. Popliteal Vein: No evidence of thrombus. Normal compressibility, respiratory phasicity and response to augmentation. Calf Veins: No evidence of thrombus. Normal compressibility and flow on color Doppler imaging. Superficial Great Saphenous Vein: No evidence of thrombus. Normal compressibility and flow on color Doppler imaging. Venous Reflux:  None. Other Findings: There is a minimal amount of subcutaneous edema noted at the level of the left lower leg and calf. IMPRESSION: No evidence of DVT within the left lower extremity. Electronically Signed   By: Sandi Mariscal M.D.   On: 08/19/2015 18:03    Subjective: Eager to go home today  Discharge Exam: Vitals:   09/09/15 1954 09/10/15 0614  BP: 140/73 (!) 159/79  Pulse: (!) 107 97  Resp: 20 20  Temp: 98.5 F (36.9 C) 98.3 F (36.8 C)   Vitals:   09/09/15 1136 09/09/15 1323 09/09/15 1954 09/10/15 0614  BP: (!) 159/62 128/63 140/73 (!) 159/79  Pulse: (!) 105 95 (!) 107 97  Resp:  '18 20 20  '$ Temp:  98.2 F (36.8 C) 98.5 F (36.9 C) 98.3 F (36.8 C)  TempSrc:   Oral Oral  SpO2: 99% 100% 100% 100%  Weight:    53.3 kg (117 lb 8 oz)  Height:        General: Pt is alert, awake, not in acute distress Cardiovascular: RRR, S1/S2 +, Respiratory: CTA bilaterally, no wheezing, no rhonchi Abdominal: Soft, NT, ND, bowel sounds + Extremities: no edema, no cyanosis   The results of significant diagnostics from this hospitalization (including imaging, microbiology, ancillary and laboratory) are listed below for reference.     Microbiology: Recent Results (from the past 240 hour(s))   Culture, blood (routine x 2) Call MD if unable to obtain prior to antibiotics being given     Status: None (Preliminary result)   Collection Time: 09/08/15  3:23 PM  Result Value Ref Range Status   Specimen Description BLOOD PORTA CATH  Final   Special Requests BOTTLES DRAWN AEROBIC AND ANAEROBIC 6CC  Final   Culture NO GROWTH 2 DAYS  Final   Report Status PENDING  Incomplete  Culture, blood (routine x 2) Call MD if unable to obtain prior to antibiotics being given     Status: None (Preliminary result)   Collection Time: 09/08/15  3:31 PM  Result Value Ref Range Status   Specimen Description BLOOD LEFT WRIST  Final   Special Requests BOTTLES DRAWN AEROBIC ONLY [place]  Final   Culture NO GROWTH 2 DAYS  Final   Report Status PENDING  Incomplete     Labs: BNP (last 3 results) No  results for input(s): BNP in the last 8760 hours. Basic Metabolic Panel:  Recent Labs Lab 09/07/15 1406 09/08/15 1230 09/09/15 0700 09/10/15 0915  NA 134* 134* 135 136  K 4.5 4.3 3.7 3.8  CL 99* 98* 103 105  CO2 '27 25 25 26  '$ GLUCOSE 145* 116* 99 95  BUN 38* 35* 29* 25*  CREATININE 0.89 0.87 0.75 0.68  CALCIUM 8.5* 8.5* 8.4* 8.3*   Liver Function Tests:  Recent Labs Lab 09/07/15 1406 09/09/15 0700 09/10/15 0915  AST 40 37 37  ALT 15 13* 12*  ALKPHOS 64 55 56  BILITOT 0.6 0.8 0.8  PROT 5.8* 5.1* 4.9*  ALBUMIN 3.0* 2.7* 2.6*   No results for input(s): LIPASE, AMYLASE in the last 168 hours. No results for input(s): AMMONIA in the last 168 hours. CBC:  Recent Labs Lab 09/07/15 1406 09/08/15 1230 09/09/15 0700  WBC 11.1* 14.6* 10.5  NEUTROABS 9.6* 12.7*  --   HGB 10.0* 10.0* 9.8*  HCT 31.2* 31.2* 30.5*  MCV 95.4 96.9 96.2  PLT 295 PLATELET CLUMPS NOTED ON SMEAR, COUNT APPEARS ADEQUATE 260   Cardiac Enzymes: No results for input(s): CKTOTAL, CKMB, CKMBINDEX, TROPONINI in the last 168 hours. BNP: Invalid input(s): POCBNP CBG: No results for input(s): GLUCAP in the last 168  hours. D-Dimer No results for input(s): DDIMER in the last 72 hours. Hgb A1c No results for input(s): HGBA1C in the last 72 hours. Lipid Profile No results for input(s): CHOL, HDL, LDLCALC, TRIG, CHOLHDL, LDLDIRECT in the last 72 hours. Thyroid function studies No results for input(s): TSH, T4TOTAL, T3FREE, THYROIDAB in the last 72 hours.  Invalid input(s): FREET3 Anemia work up No results for input(s): VITAMINB12, FOLATE, FERRITIN, TIBC, IRON, RETICCTPCT in the last 72 hours. Urinalysis    Component Value Date/Time   COLORURINE YELLOW 09/08/2015 [place] 09/08/2015 1237   LABSPEC 1.015 09/08/2015 1237   PHURINE 6.0 09/08/2015 1237   GLUCOSEU NEGATIVE 09/08/2015 1237   HGBUR NEGATIVE 09/08/2015 [place] 09/08/2015 1237   KETONESUR NEGATIVE 09/08/2015 1237   PROTEINUR 30 (A) 09/08/2015 1237   UROBILINOGEN 0.2 10/25/2014 0021   NITRITE NEGATIVE 09/08/2015 1237   LEUKOCYTESUR NEGATIVE 09/08/2015 1237   Sepsis Labs Invalid input(s): PROCALCITONIN,  WBC,  LACTICIDVEN Microbiology Recent Results (from the past 240 hour(s))  Culture, blood (routine x 2) Call MD if unable to obtain prior to antibiotics being given     Status: None (Preliminary result)   Collection Time: 09/08/15  3:23 PM  Result Value Ref Range Status   Specimen Description BLOOD PORTA CATH  Final   Special Requests BOTTLES DRAWN AEROBIC AND ANAEROBIC 6CC  Final   Culture NO GROWTH 2 DAYS  Final   Report Status PENDING  Incomplete  Culture, blood (routine x 2) Call MD if unable to obtain prior to antibiotics being given     Status: None (Preliminary result)   Collection Time: 09/08/15  3:31 PM  Result Value Ref Range Status   Specimen Description BLOOD LEFT WRIST  Final   Special Requests BOTTLES DRAWN AEROBIC ONLY [place]  Final   Culture NO GROWTH 2 DAYS  Final   Report Status PENDING  Incomplete     SIGNED:   Apphia Cropley, Orpah Melter, MD  [facility] 09/10/2015, 12:55  PM  If 7PM-7AM, please contact night-coverage [email or web address] Password [email or web address]

## 2015-09-10 NOTE — Telephone Encounter (Signed)
Received call from son [name]  requested refill of medication.  Spoke with  [name] and was informed that Dr. Alvy Bimler wanted to switch pt's pain med from Hydrocodone to  Hydromorphone when pt was discharged from the hospital.  [name] would like to pick up script today before 4 pm. [facility]     Phone     [phone].

## 2015-09-10 NOTE — Progress Notes (Signed)
Notified Dr. Earlie Counts of the patients questions regarding the patients discharge medications.  Clarified with MD the patients pain medication and the Norvasc dose.  MD stated to follow her primary care MD orders for her pain management and that it was ok for the patient to take 5 mg of the Norvasc versus the 5 mg.  Discussed with the family and patient who verbalized understanding.  Patient discharged with instructions, prescription, and care notes.  Verbalized understanding via teach back.  IV was removed and the site was WNL. Patient voiced no further complaints or concerns at the time of discharge.  Appointments scheduled per instructions.  Patient left the floor via w/c family and staff in stable condition.

## 2015-09-10 NOTE — Care Management Note (Signed)
Case Management Note  Patient Details  Name: Megan Sims MRN: [id number] Date of Birth: August 05, 1955  Expected Discharge Date:  09/10/15               Expected Discharge Plan:  [place]  In-House Referral:  Clinical Social Work  Discharge planning Services  CM Consult  Post Acute Care Choice:  Home Health, Resumption of Svcs/PTA Provider Choice offered to:  Patient  DME Arranged:    DME Agency:     HH Arranged:  RN, PT, OT HH Agency:  [facility]  Status of Service:  Completed, signed off  If discussed at [place] of Stay Meetings, dates discussed:    Additional Comments: Patient returning home today with resumption of [facility] services. Pt is aware that [facility] has 48hrs to resume services. Abby, with [facility], is aware of DC and will obtain pt info from chart.   Sherald Barge, RN 09/10/2015, 12:37 PM

## 2015-09-10 NOTE — Telephone Encounter (Signed)
[facility] will give them the prescription

## 2015-09-10 NOTE — Telephone Encounter (Signed)
Pt's son says pt d/c'd from [facility] w/ Rx for Hydrocodone w/ tylenol.  He tried to get them to prescribe dilaudid because he knows Dr. Alvy Bimler didn't want pt to have pain medication w/ tylenol.  Son says they never picked up Rx for pain medication that Dr. Alvy Bimler wrote a few weeks ago.  Informed son that Rx should still be here ready to pick up and he needs to be here before 4 pm.  He verbalized understanding.

## 2015-09-13 LAB — CULTURE, BLOOD (ROUTINE X 2)
CULTURE: NO GROWTH
Culture: NO GROWTH

## 2015-09-14 ENCOUNTER — Other Ambulatory Visit: Payer: Self-pay | Admitting: *Deleted

## 2015-09-14 ENCOUNTER — Telehealth (HOSPITAL_COMMUNITY): Payer: Self-pay

## 2015-09-14 ENCOUNTER — Telehealth: Payer: Self-pay | Admitting: *Deleted

## 2015-09-14 DIAGNOSIS — M360 Dermato(poly)myositis in neoplastic disease: Secondary | ICD-10-CM

## 2015-09-14 MED ORDER — PREDNISONE 20 MG PO TABS: 20.0000 mg | ORAL_TABLET | Freq: Every day | ORAL | 0 refills | Status: DC

## 2015-09-14 NOTE — Telephone Encounter (Signed)
Pls call in 20 mg prednisone daily, 30 tabs, 1 refills

## 2015-09-14 NOTE — Telephone Encounter (Signed)
8/22 left a message to reschedule the SP appt on 8/30 to 8/29 at either 9:45 or 11:15

## 2015-09-14 NOTE — Telephone Encounter (Signed)
Pt states she needs refill on prednisone. Was told by pharmacy that refill was declined by Dr Alvy Bimler and pt needed to call us.

## 2015-09-14 NOTE — Telephone Encounter (Signed)
"  I was told by my pharmacy of a note to call provider about the prednisone refill."  Call transferred ext [phone].  Received voicemail.

## 2015-09-15 ENCOUNTER — Emergency Department (HOSPITAL_COMMUNITY)
Admission: EM | Admit: 2015-09-15 | Discharge: 2015-09-15 | Disposition: A | Discharge status: Home / Self Care | Payer: Medicaid Other | Attending: Emergency Medicine | Admitting: Emergency Medicine

## 2015-09-15 ENCOUNTER — Emergency Department (HOSPITAL_COMMUNITY): Payer: Medicaid Other

## 2015-09-15 ENCOUNTER — Encounter (HOSPITAL_COMMUNITY): Payer: Self-pay | Admitting: Emergency Medicine

## 2015-09-15 DIAGNOSIS — I10 Essential (primary) hypertension: Secondary | ICD-10-CM | POA: Diagnosis not present

## 2015-09-15 DIAGNOSIS — R5383 Other fatigue: Secondary | ICD-10-CM | POA: Insufficient documentation

## 2015-09-15 DIAGNOSIS — Z85118 Personal history of other malignant neoplasm of bronchus and lung: Secondary | ICD-10-CM | POA: Diagnosis not present

## 2015-09-15 DIAGNOSIS — R600 Localized edema: Secondary | ICD-10-CM | POA: Diagnosis present

## 2015-09-15 DIAGNOSIS — F17210 Nicotine dependence, cigarettes, uncomplicated: Secondary | ICD-10-CM | POA: Insufficient documentation

## 2015-09-15 DIAGNOSIS — R05 Cough: Secondary | ICD-10-CM | POA: Diagnosis not present

## 2015-09-15 DIAGNOSIS — Z79899 Other long term (current) drug therapy: Secondary | ICD-10-CM | POA: Insufficient documentation

## 2015-09-15 DIAGNOSIS — R21 Rash and other nonspecific skin eruption: Secondary | ICD-10-CM | POA: Diagnosis not present

## 2015-09-15 DIAGNOSIS — M7989 Other specified soft tissue disorders: Secondary | ICD-10-CM | POA: Insufficient documentation

## 2015-09-15 LAB — BASIC METABOLIC PANEL
Anion gap: 4 — ABNORMAL LOW (ref 5–15)
BUN: 43 mg/dL — AB (ref 6–20)
CALCIUM: 8 mg/dL — AB (ref 8.9–10.3)
CO2: 25 mmol/L (ref 22–32)
CREATININE: 0.8 mg/dL (ref 0.44–1.00)
Chloride: 102 mmol/L (ref 101–111)
GFR calc Af Amer: >60 mL/min (ref >60)
GLUCOSE: 115 mg/dL — AB (ref 65–99)
Potassium: 4.7 mmol/L (ref 3.5–5.1)
Sodium: 131 mmol/L — ABNORMAL LOW (ref 135–145)

## 2015-09-15 LAB — CBC WITH DIFFERENTIAL/PLATELET
Basophils Absolute: 0 10*3/uL (ref 0.0–0.1)
Basophils Relative: 0 %
EOS PCT: 0 %
Eosinophils Absolute: 0 10*3/uL (ref 0.0–0.7)
HCT: 30.2 % — ABNORMAL LOW (ref 36.0–46.0)
Hemoglobin: 9.6 g/dL — ABNORMAL LOW (ref 12.0–15.0)
LYMPHS ABS: 0.2 10*3/uL — AB (ref 0.7–4.0)
LYMPHS PCT: 1 %
MCH: 30.9 pg (ref 26.0–34.0)
MCHC: 31.8 g/dL (ref 30.0–36.0)
MCV: 97.1 fL (ref 78.0–100.0)
MONO ABS: 1.2 10*3/uL — AB (ref 0.1–1.0)
Monocytes Relative: 9 %
Neutro Abs: 12.6 10*3/uL — ABNORMAL HIGH (ref 1.7–7.7)
Neutrophils Relative %: 90 %
PLATELETS: 179 10*3/uL (ref 150–400)
RBC: 3.11 MIL/uL — ABNORMAL LOW (ref 3.87–5.11)
RDW: 18.9 % — AB (ref 11.5–15.5)
WBC: 14 10*3/uL — ABNORMAL HIGH (ref 4.0–10.5)

## 2015-09-15 LAB — URINALYSIS, ROUTINE W REFLEX MICROSCOPIC
BILIRUBIN URINE: NEGATIVE
GLUCOSE, UA: NEGATIVE mg/dL
KETONES UR: NEGATIVE mg/dL
Leukocytes, UA: NEGATIVE
Nitrite: NEGATIVE
PH: 6 (ref 5.0–8.0)
Protein, ur: 30 mg/dL — AB
Specific Gravity, Urine: 1.02 (ref 1.005–1.030)

## 2015-09-15 LAB — URINE MICROSCOPIC-ADD ON

## 2015-09-15 LAB — LIPASE, BLOOD: Lipase: 24 U/L (ref 11–51)

## 2015-09-15 NOTE — ED Provider Notes (Signed)
[facility] DEPT Provider Note   CSN: [id number] Arrival date & time: 09/15/15  1330     History   Chief Complaint Chief Complaint  Patient presents with  . Edema    HPI Megan Sims is a 60 y.o. female.  Patient recent discharge from the hospital on Friday. Was admitted August 16 for pneumonia possible sepsis. Patient has a known history of right-sided lung cancer stage III nonoperable. Patient followed by hematology oncology at [facility] long cancer center. Patient presents today with concern for left arm swelling in bilateral leg swelling left greater than right. Patient states that the pneumonia in the breathing problems seem to be improved. Patient states that the left arm was having evidence of swelling when she was in the hospital. Patient denies any chest pain or any increased shortness of breath. Patient is still on Augmentin for the pneumonia.      Past Medical History:  Diagnosis Date  . Anemia 08/03/2015  . Cancer [facility])    malignant neoplasm of unknown origin  . Cerebral aneurysm 2 brain surgeries 96 or 97  . Chronic neck pain 09/24/2014  . Diarrhea   . Emphysema of lung ([place]) 05/05/2015  . Hypertension   . Hyponatremia   . Left knee pain   . Left leg weakness 06/2015  . Lung abnormality   . Lung cancer ([place]) 10/07/2014  . Malignant neoplasm of unknown origin ([place])   . Migraine   . Nicotine dependence 09/24/2014  . Oral thrush 09/24/2014  . Paget disease of bone   . Pleurisy   . Renal insufficiency    Patient states " no kidney problems  . S/P biopsy    of throat per patient.  . Skin ulcer ([place]) 11/18/2014  . 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 HCAP (healthcare-associated pneumonia) 09/08/2015  . Diarrhea 09/02/2015  . Closed fracture of left femur ([place])   . Bleeding   . Acute blood loss anemia   . Fall from slip, trip, or stumble 08/07/2015  . Left femoral shaft fracture ([place]) 08/07/2015  .  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 S/P gastrostomy ([place]) 08/04/2015  . Deficiency anemia 08/03/2015  . Dehydration 07/02/2015  . Candida esophagitis ([place])   . Anemia due to other cause   . Mucosal abnormality of stomach   . Pill esophagitis due to tetracycline 05/24/2015  . UTI (lower urinary tract infection) 05/24/2015  . Emphysema of lung ([place]) 05/05/2015  . Rash, skin 05/05/2015  . Severe sepsis ([place]) 04/10/2015  . AKI (acute kidney injury) ([place]) 04/10/2015  . Hyponatremia 04/10/2015  . Sepsis due to pneumonia ([place])   . CAP (community acquired pneumonia) 04/09/2015  . Skin ulcer ([place]) 11/18/2014  . Musculoskeletal chest pain 10/25/2014  . Pain in the chest   . Tachycardia   . Anemia due to antineoplastic chemotherapy 10/21/2014  . Cancer of upper lobe of right lung ([place]) 10/07/2014  . Oral thrush 09/24/2014  . Nicotine dependence 09/24/2014  . Chronic neck pain 09/24/2014  . Excessive weight loss 09/24/2014  . 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 Bilateral lung cancer ([place])   . Paget's disease of bone   . Dysphagia 09/05/2014  . Essential hypertension 09/05/2014    Past Surgical History:  Procedure Laterality Date  . CEREBRAL ANEURYSM REPAIR  96 or 97  . CHEST TUBE INSERTION    . COLONOSCOPY     10 years ago  in [place]   . ESOPHAGOGASTRODUODENOSCOPY  06/16/2015   Dr. Gala Romney: edentulous cricopharyngeus, esophageal plaques, biopsy consistent with candida  . ESOPHAGOGASTRODUODENOSCOPY N/A 06/16/2015   Procedure: ESOPHAGOGASTRODUODENOSCOPY (EGD);  Surgeon: Daneil Dolin, MD;  Location: [facility];  Service: Endoscopy;  Laterality: N/A;  . ESOPHAGOGASTRODUODENOSCOPY N/A 07/06/2015   Procedure: ESOPHAGOGASTRODUODENOSCOPY (EGD);  Surgeon: Danie Binder, MD;  Location: [facility] ENDO SUITE;  Service: Endoscopy;  Laterality: N/A;  . INTRAMEDULLARY (IM) NAIL INTERTROCHANTERIC Left 08/07/2015   Procedure: INTRAMEDULLARY (IM) NAIL  INTERTROCHANTRIC;  Surgeon: Melrose Nakayama, MD;  Location: [facility] ORS;  Service: Orthopedics;  Laterality: Left;  . LARYNGOSCOPY  Jun 22, 2015   [facility], Dr. Erik Obey: normal oropharynx, no lesions, mobile vocal cords with good airway.  . PEG PLACEMENT N/A 07/06/2015   Procedure: PERCUTANEOUS ENDOSCOPIC GASTROSTOMY (PEG) PLACEMENT;  Surgeon: Danie Binder, MD;  Location: [facility];  Service: Endoscopy;  Laterality: N/A;  . TUBAL LIGATION      OB History    Gravida Para Term Preterm AB Living             3   SAB TAB Ectopic Multiple Live Births                   Home Medications    Prior to Admission medications   Medication Sig Start Date End Date Taking? Authorizing Provider  albuterol (PROVENTIL HFA;VENTOLIN HFA) 108 (90 Base) MCG/ACT inhaler Inhale 1-2 puffs into the lungs every 6 (six) hours as needed for wheezing or shortness of breath.    Historical Provider, MD  amLODipine (NORVASC) 10 MG tablet Take 1 tablet (10 mg total) by mouth daily. Patient taking differently: Take 5 mg by mouth daily.  08/30/15   Heath Lark, MD  amoxicillin-clavulanate (AUGMENTIN) 250-62.5 MG/5ML suspension Place 10 mLs (500 mg total) into feeding tube 3 (three) times daily. 09/10/15 09/17/15  Donne Hazel, MD  HYDROcodone-acetaminophen (HYCET) 7.5-325 mg/15 ml solution Take 15 mLs by mouth every 6 (six) hours as needed. 07/28/15   Historical Provider, MD  HYDROcodone-acetaminophen (NORCO/VICODIN) 5-325 MG tablet Take 1-2 tablets by mouth every 6 (six) hours as needed for moderate pain. Patient not taking: Reported on 08/27/2015 08/16/15   Tiffany L Reed, DO  metoprolol tartrate (LOPRESSOR) 25 MG tablet Take 0.5 tablets (12.5 mg total) by mouth 2 (two) times daily. 09/10/15   Donne Hazel, MD  Nutritional Supplements (FEEDING SUPPLEMENT, JEVITY 1.2 CAL,) LIQD Place 237 mLs into feeding tube every 3 (three) hours. Patient not taking: Reported on 09/07/2015 07/08/15   Rosita Fire, MD  predniSONE (DELTASONE) 20 MG  tablet Take 1 tablet (20 mg total) by mouth daily with breakfast. 09/14/15   Heath Lark, MD  sucralfate (CARAFATE) 1 GM/10ML suspension Take 10 mLs (1 g total) by mouth 4 (four) times daily -  with meals and at bedtime. Patient not taking: Reported on 08/27/2015 08/06/15   Heath Lark, MD  zolpidem (AMBIEN) 10 MG tablet Take 10 mg by mouth at bedtime as needed for sleep.    Historical Provider, MD    Family History Family History  Problem Relation Age of Onset  . Hypertension    . Diabetes    . Kidney disease    . Cancer Mother     throat ca  . Cancer Maternal Grandmother     thyroid ca  . Colon cancer Neg Hx     Social History Social History  Substance Use Topics  . Smoking status: Current Every  Day Smoker    Packs/day: 0.50    Years: 34.00    Types: Cigarettes  . Smokeless tobacco: Never Used     Comment: HOWEVER, will smoke 1 cigarette if "tragic" event (death), or if she needs it for her nerves   . Alcohol use No     Allergies   Hydrocodone-acetaminophen; Tetracyclines & related; and Tramadol   Review of Systems Review of Systems  Constitutional: Positive for fatigue. Negative for fever.  HENT: Negative for congestion.   Eyes: Negative for visual disturbance.  Respiratory: Positive for cough. Negative for shortness of breath.   Cardiovascular: Positive for leg swelling. Negative for chest pain.  Gastrointestinal: Negative for abdominal pain.  Musculoskeletal: Negative for back pain and neck pain.  Skin: Positive for rash.  Allergic/Immunologic: Positive for immunocompromised state.  Neurological: Negative for headaches.  Hematological: Does not bruise/bleed easily.  Psychiatric/Behavioral: Negative for confusion.     Physical Exam Updated Vital Signs BP 117/93   Pulse 93   Temp 98.1 F (36.7 C) (Oral)   Resp 18   Ht '5\' 1"'$  (1.549 m)   Wt 53.1 kg   SpO2 99%   BMI 22.11 kg/m   Physical Exam  Constitutional: She is oriented to person, place, and time. She  appears well-developed and well-nourished. No distress.  HENT:  Head: Normocephalic and atraumatic.  Mouth/Throat: Oropharynx is clear and moist.  Eyes: EOM are normal. Pupils are equal, round, and reactive to light.  Neck: Normal range of motion. Neck supple.  Cardiovascular: Normal rate, regular rhythm and normal heart sounds.   No murmur heard. Pulmonary/Chest: Effort normal and breath sounds normal. No respiratory distress. She has no wheezes.  Abdominal: Soft. Bowel sounds are normal. She exhibits no distension.  G-tube in the epigastric area.  Musculoskeletal: Normal range of motion. She exhibits edema. She exhibits no tenderness.  Patient was significant swelling to the left arm. Radial pulses 2+. Nontender. No erythema. No increased warmth. Slight pitting edema to the right leg increased pitting edema to the left leg. Also no erythema nontender. Good cap refill.  Neurological: She is alert and oriented to person, place, and time. No cranial nerve deficit. She exhibits normal muscle tone. Coordination normal.  Skin: Skin is warm. Capillary refill takes less than 2 seconds. Rash noted.  Patient with dermatitis.type rash to the face is been present in the past.     ED Treatments / Results  Labs (all labs ordered are listed, but only abnormal results are displayed) Labs Reviewed  CBC WITH DIFFERENTIAL/PLATELET - Abnormal; Notable for the following:       Result Value   WBC 14.0 (*)    RBC 3.11 (*)    Hemoglobin 9.6 (*)    HCT 30.2 (*)    RDW 18.9 (*)    Neutro Abs 12.6 (*)    Lymphs Abs 0.2 (*)    Monocytes Absolute 1.2 (*)    All other components within normal limits  BASIC METABOLIC PANEL - Abnormal; Notable for the following:    Sodium 131 (*)    Glucose, Bld 115 (*)    BUN 43 (*)    Calcium 8.0 (*)    Anion gap 4 (*)    All other components within normal limits  URINALYSIS, ROUTINE W REFLEX MICROSCOPIC (NOT AT [facility]) - Abnormal; Notable for the following:    Hgb urine  dipstick SMALL (*)    Protein, ur 30 (*)    All other components within normal limits  URINE MICROSCOPIC-ADD ON - Abnormal; Notable for the following:    Squamous Epithelial / LPF 0-5 (*)    Bacteria, UA RARE (*)    All other components within normal limits  LIPASE, BLOOD    EKG  EKG Interpretation None       Radiology Dg Chest 2 View  Result Date: 09/15/2015 CLINICAL DATA:  Pneumonia. Cough. Emphysema. Lung cancer. Current smoker. EXAM: CHEST  2 VIEW COMPARISON:  09/07/2015 FINDINGS: A left Port-A-Cath which terminates at the mid SVC. Midline trachea. Normal heart size. Atherosclerosis in the transverse aorta. Right hemidiaphragm elevation. No pleural effusion or pneumothorax. Right middle lobe collapse/ consolidative change is similar to the prior exam but new since 08/27/2015 Clear left lung. IMPRESSION: Similar right middle lobe collapse/consolidative change. Given interval development since 08/27/2015, most consistent with persistent pneumonia. Electronically Signed   By: Abigail Miyamoto M.D.   On: 09/15/2015 18:29    Procedures Procedures (including critical care time)  Medications Ordered in ED Medications - No data to display   Initial Impression / Assessment and Plan / ED Course  I have reviewed the triage vital signs and the nursing notes.  Pertinent labs & imaging results that were available during my care of the patient were reviewed by me and considered in my medical decision making (see chart for details).  Clinical Course    Patient with history of lung cancer. Stage III involving the right lung. Followed by hematology oncology at the cancer center at [facility] long. Patient with recent admission on August 16 for presumed pneumonia. Patient had CT scan August for the raise some concerns for recurrent or active disease related to the cancer in the right lung area. Patient improved in the hospital with treatment for hospital-acquired pneumonia. Patient discharged on  Augmentin. Patient doing well from that. Patient with 2 concerns here today. One is swelling in the left arm which was present at the time of admission but she feels it's gotten worse. And also swelling in the left leg greater than right leg.  X-ray shows improvement in the pneumonia. Patient's labs without significant changes other than a slight increase in her leukocytosis. Other labs without significant abnormalities for this patient.  Patient does not have follow-up with hematology oncology until the end of August.  Doppler studies not available to evaluate her left arm arranged as an outpatient to be done tomorrow. Also CT scan is down 7 not able to do a CT of her chest for more close evaluation particularly of the left lung area. Do not have a good explanation for the swelling of the left arm. It was not evaluated during her most recent hospitalization. Patient just discharged on Friday.  Today patient clinically without any concerns for sepsis. Patient is not febrile no significant blood pressure heart rate abnormalities.  Patient clinically stable for discharge home and outpatient workup. Patient will return for any new or worse symptoms.  Final Clinical Impressions(s) / ED Diagnoses   Final diagnoses:  Left arm swelling    New Prescriptions New Prescriptions   No medications on file     Fredia Sorrow, MD 09/15/15 2032

## 2015-09-15 NOTE — ED Notes (Signed)
MD at bedside. 

## 2015-09-15 NOTE — ED Triage Notes (Addendum)
Pt reports LT upper and lower extremity edema and diarrhea. Pt states when she was d/c from the hospital a few days ago, her hand was swollen, but it has not improved. States last night her leg and foot began swelling. Pt hx of recent hip fracture. Pt is a cancer pt receiving tx for Stage III lung cancer.

## 2015-09-15 NOTE — Discharge Instructions (Signed)
Make an appointment to follow-up with your hematology oncology Dr. recommend calling them for them to review your labs. Return for any new or worse symptoms. Radiology department here at [facility] will contact you tomorrow when to come in for the ultrasound study of your left arm to rule out a blood clot.

## 2015-09-15 NOTE — ED Notes (Signed)
Assisted pt on bedpan and turned on right side

## 2015-09-16 ENCOUNTER — Ambulatory Visit (HOSPITAL_COMMUNITY)
Admit: 2015-09-16 | Discharge: 2015-09-16 | Disposition: A | Discharge status: Home / Self Care | Payer: Medicaid Other | Attending: Emergency Medicine | Admitting: Emergency Medicine

## 2015-09-16 DIAGNOSIS — M7989 Other specified soft tissue disorders: Secondary | ICD-10-CM | POA: Diagnosis present

## 2015-09-16 NOTE — ED Notes (Signed)
Notified pt of ultrasound results

## 2015-09-21 ENCOUNTER — Encounter: Payer: Self-pay | Admitting: Hematology and Oncology

## 2015-09-21 ENCOUNTER — Emergency Department (HOSPITAL_COMMUNITY): Payer: Medicaid Other

## 2015-09-21 ENCOUNTER — Ambulatory Visit (HOSPITAL_COMMUNITY): Payer: Medicaid Other | Admitting: Speech Pathology

## 2015-09-21 ENCOUNTER — Telehealth: Payer: Self-pay | Admitting: Hematology and Oncology

## 2015-09-21 ENCOUNTER — Telehealth (HOSPITAL_COMMUNITY): Payer: Self-pay

## 2015-09-21 ENCOUNTER — Ambulatory Visit (HOSPITAL_BASED_OUTPATIENT_CLINIC_OR_DEPARTMENT_OTHER): Payer: Medicaid Other | Admitting: Hematology and Oncology

## 2015-09-21 ENCOUNTER — Telehealth: Payer: Self-pay | Admitting: *Deleted

## 2015-09-21 ENCOUNTER — Emergency Department (HOSPITAL_COMMUNITY)
Admission: EM | Admit: 2015-09-21 | Discharge: 2015-09-21 | Disposition: A | Discharge status: Home / Self Care | Payer: Medicaid Other | Attending: Emergency Medicine | Admitting: Emergency Medicine

## 2015-09-21 ENCOUNTER — Ambulatory Visit (HOSPITAL_BASED_OUTPATIENT_CLINIC_OR_DEPARTMENT_OTHER): Payer: Medicaid Other

## 2015-09-21 ENCOUNTER — Encounter (HOSPITAL_COMMUNITY): Payer: Self-pay | Admitting: Emergency Medicine

## 2015-09-21 ENCOUNTER — Other Ambulatory Visit: Payer: Self-pay | Admitting: Hematology and Oncology

## 2015-09-21 VITALS — BP 149/71 | HR 110 | Temp 98.4°F | Resp 17 | Ht 61.0 in | Wt 121.0 lb

## 2015-09-21 DIAGNOSIS — D539 Nutritional anemia, unspecified: Secondary | ICD-10-CM | POA: Diagnosis not present

## 2015-09-21 DIAGNOSIS — Z79899 Other long term (current) drug therapy: Secondary | ICD-10-CM | POA: Diagnosis not present

## 2015-09-21 DIAGNOSIS — M7989 Other specified soft tissue disorders: Secondary | ICD-10-CM | POA: Diagnosis not present

## 2015-09-21 DIAGNOSIS — R1310 Dysphagia, unspecified: Secondary | ICD-10-CM

## 2015-09-21 DIAGNOSIS — R079 Chest pain, unspecified: Secondary | ICD-10-CM | POA: Diagnosis not present

## 2015-09-21 DIAGNOSIS — C3492 Malignant neoplasm of unspecified part of left bronchus or lung: Secondary | ICD-10-CM

## 2015-09-21 DIAGNOSIS — G8929 Other chronic pain: Secondary | ICD-10-CM

## 2015-09-21 DIAGNOSIS — Z85118 Personal history of other malignant neoplasm of bronchus and lung: Secondary | ICD-10-CM | POA: Insufficient documentation

## 2015-09-21 DIAGNOSIS — M360 Dermato(poly)myositis in neoplastic disease: Secondary | ICD-10-CM | POA: Diagnosis not present

## 2015-09-21 DIAGNOSIS — I10 Essential (primary) hypertension: Secondary | ICD-10-CM | POA: Diagnosis not present

## 2015-09-21 DIAGNOSIS — M542 Cervicalgia: Secondary | ICD-10-CM

## 2015-09-21 DIAGNOSIS — E860 Dehydration: Secondary | ICD-10-CM | POA: Diagnosis not present

## 2015-09-21 DIAGNOSIS — Z7951 Long term (current) use of inhaled steroids: Secondary | ICD-10-CM | POA: Insufficient documentation

## 2015-09-21 DIAGNOSIS — R05 Cough: Secondary | ICD-10-CM | POA: Diagnosis not present

## 2015-09-21 DIAGNOSIS — R0602 Shortness of breath: Secondary | ICD-10-CM | POA: Diagnosis present

## 2015-09-21 DIAGNOSIS — F17210 Nicotine dependence, cigarettes, uncomplicated: Secondary | ICD-10-CM | POA: Diagnosis not present

## 2015-09-21 DIAGNOSIS — C3491 Malignant neoplasm of unspecified part of right bronchus or lung: Secondary | ICD-10-CM

## 2015-09-21 DIAGNOSIS — Z931 Gastrostomy status: Secondary | ICD-10-CM

## 2015-09-21 DIAGNOSIS — R0600 Dyspnea, unspecified: Secondary | ICD-10-CM

## 2015-09-21 LAB — I-STAT TROPONIN, ED: Troponin i, poc: 0.01 ng/mL (ref 0.00–0.08)

## 2015-09-21 LAB — COMPREHENSIVE METABOLIC PANEL
ALBUMIN: 3 g/dL — AB (ref 3.5–5.0)
ALK PHOS: 65 U/L (ref 38–126)
ALT: 17 U/L (ref 14–54)
ANION GAP: 9 (ref 5–15)
AST: 51 U/L — AB (ref 15–41)
BILIRUBIN TOTAL: 0.9 mg/dL (ref 0.3–1.2)
BUN: 39 mg/dL — AB (ref 6–20)
CALCIUM: 8.7 mg/dL — AB (ref 8.9–10.3)
CO2: 25 mmol/L (ref 22–32)
CREATININE: 0.78 mg/dL (ref 0.44–1.00)
Chloride: 101 mmol/L (ref 101–111)
GFR calc Af Amer: >60 mL/min (ref >60)
GFR calc non Af Amer: >60 mL/min (ref >60)
GLUCOSE: 98 mg/dL (ref 65–99)
Potassium: 4 mmol/L (ref 3.5–5.1)
Sodium: 135 mmol/L (ref 135–145)
TOTAL PROTEIN: 5.8 g/dL — AB (ref 6.5–8.1)

## 2015-09-21 LAB — CBC WITH DIFFERENTIAL/PLATELET
BASOS ABS: 0 10*3/uL (ref 0.0–0.1)
Basophils Relative: 0 %
EOS PCT: 0 %
Eosinophils Absolute: 0 10*3/uL (ref 0.0–0.7)
HEMATOCRIT: 30 % — AB (ref 36.0–46.0)
HEMOGLOBIN: 9.6 g/dL — AB (ref 12.0–15.0)
Lymphocytes Relative: 5 %
Lymphs Abs: 0.6 10*3/uL — ABNORMAL LOW (ref 0.7–4.0)
MCH: 30.4 pg (ref 26.0–34.0)
MCHC: 32 g/dL (ref 30.0–36.0)
MCV: 94.9 fL (ref 78.0–100.0)
MONO ABS: 1.5 10*3/uL — AB (ref 0.1–1.0)
MONOS PCT: 13 %
Neutro Abs: 9.1 10*3/uL — ABNORMAL HIGH (ref 1.7–7.7)
Neutrophils Relative %: 82 %
Platelets: 183 10*3/uL (ref 150–400)
RBC: 3.16 MIL/uL — AB (ref 3.87–5.11)
RDW: 19 % — ABNORMAL HIGH (ref 11.5–15.5)
WBC: 11.2 10*3/uL — ABNORMAL HIGH (ref 4.0–10.5)

## 2015-09-21 LAB — I-STAT CG4 LACTIC ACID, ED: LACTIC ACID, VENOUS: 2.98 mmol/L — AB (ref 0.5–1.9)

## 2015-09-21 MED ORDER — HYDROMORPHONE HCL 4 MG/ML IJ SOLN
2.0000 mg | INTRAMUSCULAR | Status: DC | PRN
  Administered 2015-09-21: 2 mg via INTRAVENOUS

## 2015-09-21 MED ORDER — HEPARIN SOD (PORK) LOCK FLUSH 100 UNIT/ML IV SOLN
500.0000 [IU] | Freq: Once | INTRAVENOUS | Status: AC | PRN
  Administered 2015-09-21: 500 [IU] via INTRAVENOUS
  Filled 2015-09-21: qty 5

## 2015-09-21 MED ORDER — HYDROMORPHONE HCL 4 MG/ML IJ SOLN
INTRAMUSCULAR | Status: AC
  Filled 2015-09-21: qty 1

## 2015-09-21 MED ORDER — HEPARIN SOD (PORK) LOCK FLUSH 100 UNIT/ML IV SOLN
500.0000 [IU] | Freq: Once | INTRAVENOUS | Status: AC
  Administered 2015-09-21: 500 [IU]
  Filled 2015-09-21: qty 5

## 2015-09-21 MED ORDER — MORPHINE SULFATE (CONCENTRATE) 20 MG/ML PO SOLN: 20.0000 mg | ORAL | 0 refills | Status: AC | PRN

## 2015-09-21 MED ORDER — SODIUM CHLORIDE 0.9 % IV BOLUS (SEPSIS)
500.0000 mL | Freq: Once | INTRAVENOUS | Status: AC
  Administered 2015-09-21: 500 mL via INTRAVENOUS

## 2015-09-21 MED ORDER — FENTANYL 25 MCG/HR TD PT72: 25.0000 ug | MEDICATED_PATCH | TRANSDERMAL | 0 refills | Status: AC

## 2015-09-21 MED ORDER — SODIUM CHLORIDE 0.9 % IV SOLN
Freq: Once | INTRAVENOUS | Status: AC
  Administered 2015-09-21: 15:00:00 via INTRAVENOUS

## 2015-09-21 MED ORDER — SODIUM CHLORIDE 0.9 % IJ SOLN
10.0000 mL | INTRAMUSCULAR | Status: DC | PRN
  Administered 2015-09-21: 10 mL via INTRAVENOUS
  Filled 2015-09-21: qty 10

## 2015-09-21 MED ORDER — HYDROMORPHONE HCL 1 MG/ML IJ SOLN
1.0000 mg | Freq: Once | INTRAMUSCULAR | Status: AC
  Administered 2015-09-21: 1 mg via INTRAVENOUS
  Filled 2015-09-21: qty 1

## 2015-09-21 NOTE — Progress Notes (Signed)
Pt for d/c  ED RN came to see ED Cm about discussing pt options with her and family AS CM entering the room pt and family were being seen by Liliane Channel of [place] center and pt is going to see Dr Maurilio Lovely after leaving [facility] ED  CM spoke with pt, daughter and female son in law (who works days) at bedside and one, "DJ" on dtr cell speaker phone CM reviewed in details medicare guidelines, Choices of home health [facility]) (length of stay in home, types of [facility] staff available, coverage, primary caregiver, up to 24 hrs before services may be started) and choices of Private duty nursing (PDN-coverage, length of stay in the home types of staff available). CM reviewed availability of [facility] to assist pcp to get pt to snf (if desired disposition) from the community level. CM provided pt/family with a list of [facility], choice connections and PDN. Daughter very familiar with PDN & CAPs. Discussed differences in [place] and medicare, the processing time 30-45 days for each Pt only with medicaid at age 15 Discussed medicare for ager 30 or if disability Pt reports spoken with someone on 09/20/15 about medicare for disability Discuss need for sw in community or [place] to assist with medicare disability processing and pcp for CAP services Discussed with medicaid RN and PT can not be in home together, either RN or PT in the home, not both Family confirms there was not found a reason for admission as inpatient per EDP/NP/PA but family wanting pt in a facility or to have sitter at home Pt voiced concern about her not being able to get to bedside commode without the need of 2+ assistance.  Wanting  Someone to stay with her.  Goals discussed 1) getting HHRN changed to HHPT, 2) finding PDN agency 3) getting assist from pcp for CAPs 1445 ED Cm spoke with Abby of [facility] about conversation with pt/family and their preference of having [place] vs HHRN plus wanting CAPs, etc Abby to assist with changes  Initiated [place]  orders in EPIC with Dr Stark Jock permission

## 2015-09-21 NOTE — ED Provider Notes (Signed)
[facility] DEPT Provider Note   CSN: [id number] Arrival date & time: 09/21/15  4098     History   Chief Complaint Chief Complaint  Patient presents with  . Shortness of Breath  . Chest Pain    HPI Megan Sims is a 60 y.o. female.  Patient is a 60 year old female with past medical history of lung cancer status post chemotherapy and radiation last year. She also has a history of Paget's disease, hypertension, and chronic renal insufficiency. She presents today for evaluation of multiple complaints. She reports "pain all over", pain in her chest, shortness of breath, pain in her right hip, and swelling to her left arm and leg. She has been seen here multiple times for similar complaints during the past month. On one occasion, she was admitted for apparent pneumonia. She completed a course of Augmentin, however continues to have a productive cough. She denies any fevers or chills.   The history is provided by the patient.  Shortness of Breath  This is a recurrent problem. The problem occurs continuously.The problem has been gradually worsening. Associated symptoms include cough, sputum production and chest pain. Pertinent negatives include no fever. She has tried nothing for the symptoms. She has had prior hospitalizations. She has had prior ED visits.  Chest Pain   Associated symptoms include cough, shortness of breath and sputum production. Pertinent negatives include no fever.    Past Medical History:  Diagnosis Date  . Anemia 08/03/2015  . Cancer [facility])    malignant neoplasm of unknown origin  . Cerebral aneurysm 2 brain surgeries 96 or 97  . Chronic neck pain 09/24/2014  . Diarrhea   . Emphysema of lung ([place]) 05/05/2015  . Hypertension   . Hyponatremia   . Left knee pain   . Left leg weakness 06/2015  . Lung abnormality   . Lung cancer ([place]) 10/07/2014  . Malignant neoplasm of unknown origin ([place])   . Migraine   . Nicotine dependence 09/24/2014  . Oral thrush 09/24/2014  .  Paget disease of bone   . Pleurisy   . Renal insufficiency    Patient states " no kidney problems  . S/P biopsy    of throat per patient.  . Skin ulcer ([place]) 11/18/2014  . 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 HCAP (healthcare-associated pneumonia) 09/08/2015  . Diarrhea 09/02/2015  . Closed fracture of left femur ([place])   . Bleeding   . Acute blood loss anemia   . Fall from slip, trip, or stumble 08/07/2015  . Left femoral shaft fracture ([place]) 08/07/2015  . 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 S/P gastrostomy ([place]) 08/04/2015  . Deficiency anemia 08/03/2015  . Dehydration 07/02/2015  . Candida esophagitis ([place])   . Anemia due to other cause   . Mucosal abnormality of stomach   . Pill esophagitis due to tetracycline 05/24/2015  . UTI (lower urinary tract infection) 05/24/2015  . Emphysema of lung ([place]) 05/05/2015  . Rash, skin 05/05/2015  . Severe sepsis ([place]) 04/10/2015  . AKI (acute kidney injury) ([place]) 04/10/2015  . Hyponatremia 04/10/2015  . Sepsis due to pneumonia ([place])   . CAP (community acquired pneumonia) 04/09/2015  . Skin ulcer ([place]) 11/18/2014  . Musculoskeletal chest pain 10/25/2014  . Pain in the chest   . Tachycardia   . Anemia due to antineoplastic chemotherapy 10/21/2014  . Cancer of upper lobe of right lung ([place]) 10/07/2014  .  Oral thrush 09/24/2014  . Nicotine dependence 09/24/2014  . Chronic neck pain 09/24/2014  . Excessive weight loss 09/24/2014  . Stevens-Johnson syndrome ([place]) 09/24/2014  . Bilateral lung cancer ([place])   . Paget's disease of bone   . Dysphagia 09/05/2014  . Essential hypertension 09/05/2014    Past Surgical History:  Procedure Laterality Date  . CEREBRAL ANEURYSM REPAIR  96 or 97  . CHEST TUBE INSERTION    . COLONOSCOPY     10 years ago in [place]   . ESOPHAGOGASTRODUODENOSCOPY  06/16/2015   Dr. Gala Romney: edentulous  cricopharyngeus, esophageal plaques, biopsy consistent with candida  . ESOPHAGOGASTRODUODENOSCOPY N/A 06/16/2015   Procedure: ESOPHAGOGASTRODUODENOSCOPY (EGD);  Surgeon: Daneil Dolin, MD;  Location[facility];  Service: Endoscopy;  Laterality: N/A;  . ESOPHAGOGASTRODUODENOSCOPY N/A 07/06/2015   Procedure: ESOPHAGOGASTRODUODENOSCOPY (EGD);  Surgeon: Danie Binder, MD;  Location: [facility] ENDO SUITE;  Service: Endoscopy;  Laterality: N/A;  . INTRAMEDULLARY (IM) NAIL INTERTROCHANTERIC Left 08/07/2015   Procedure: INTRAMEDULLARY (IM) NAIL INTERTROCHANTRIC;  Surgeon: Melrose Nakayama, MD;  Location: [facility] ORS;  Service: Orthopedics;  Laterality: Left;  . LARYNGOSCOPY  Jun 22, 2015   [facility], Dr. Erik Obey: normal oropharynx, no lesions, mobile vocal cords with good airway.  . PEG PLACEMENT N/A 07/06/2015   Procedure: PERCUTANEOUS ENDOSCOPIC GASTROSTOMY (PEG) PLACEMENT;  Surgeon: Danie Binder, MD;  Location: [facility] END[facility] SU[facility];  Service: Endoscopy;  Laterality: N/A;  . TUBAL LIGATION      OB History    Gravida Para Term Preterm AB Living             3   SAB TAB Ectopic Multiple Live Births                   Home Medications    Prior to Admission medications   Medication Sig Start Date End Date Taking? Authorizing Provider  albuterol (PROVENTIL HFA;VENTOLIN HFA) 108 (90 Base) MCG/ACT inhaler Inhale 1-2 puffs into the lungs every 6 (six) hours as needed for wheezing or shortness of breath.    Historical Provider, MD  amLODipine (NORVASC) 10 MG tablet Take 1 tablet (10 mg total) by mouth daily. Patient taking differently: Take 5 mg by mouth daily.  08/30/15   Heath Lark, MD  HYDROcodone-acetaminophen (HYCET) 7.5-325 mg/15 ml solution Take 15 mLs by mouth every 6 (six) hours as needed. 07/28/15   Historical Provider, MD  HYDROcodone-acetaminophen (NORCO/VICODIN) 5-325 MG tablet Take 1-2 tablets by mouth every 6 (six) hours as needed for moderate pain. Patient not taking: Reported on 08/27/2015 08/16/15   Tiffany L  Reed, DO  metoprolol tartrate (LOPRESSOR) 25 MG tablet Take 0.5 tablets (12.5 mg total) by mouth 2 (two) times daily. 09/10/15   Donne Hazel, MD  Nutritional Supplements (FEEDING SUPPLEMENT, JEVITY 1.2 CAL,) LIQD Place 237 mLs into feeding tube every 3 (three) hours. Patient not taking: Reported on 09/07/2015 07/08/15   Rosita Fire, MD  predniSONE (DELTASONE) 20 MG tablet Take 1 tablet (20 mg total) by mouth daily with breakfast. 09/14/15   Heath Lark, MD  sucralfate (CARAFATE) 1 GM/10ML suspension Take 10 mLs (1 g total) by mouth 4 (four) times daily -  with meals and at bedtime. Patient not taking: Reported on 08/27/2015 08/06/15   Heath Lark, MD  zolpidem (AMBIEN) 10 MG tablet Take 10 mg by mouth at bedtime as needed for sleep.    Historical Provider, MD    Family History Family History  Problem Relation Age of Onset  .  Hypertension    . Diabetes    . Kidney disease    . Cancer Mother     throat ca  . Cancer Maternal Grandmother     thyroid ca  . Colon cancer Neg Hx     Social History Social History  Substance Use Topics  . Smoking status: Current Every Day Smoker    Packs/day: 0.50    Years: 34.00    Types: Cigarettes  . Smokeless tobacco: Never Used     Comment: HOWEVER, will smoke 1 cigarette if "tragic" event (death), or if she needs it for her nerves   . Alcohol use No     Allergies   Hydrocodone-acetaminophen; Tetracyclines & related; and Tramadol   Review of Systems Review of Systems  Constitutional: Negative for fever.  Respiratory: Positive for cough, sputum production and shortness of breath.   Cardiovascular: Positive for chest pain.  All other systems reviewed and are negative.    Physical Exam Updated Vital Signs BP 127/68 (BP Location: Right Arm)   Pulse 99   Temp 97.8 F (36.6 C) (Oral)   Resp 18   Ht '5\' 1"'$  (1.549 m)   Wt 124 lb (56.2 kg)   SpO2 100%   BMI 23.43 kg/m   Physical Exam  Constitutional: She is oriented to person, place, and  time. No distress.  Patient is a chronically ill appearing 60 year old female. She is in no acute distress.  HENT:  Head: Normocephalic and atraumatic.  Mouth/Throat: Oropharynx is clear and moist.  Neck: Normal range of motion. Neck supple.  Cardiovascular: Normal rate and regular rhythm.  Exam reveals no gallop and no friction rub.   No murmur heard. Pulmonary/Chest: Effort normal. No respiratory distress. She has no wheezes. She has rales.  There are rales in the bases bilaterally.  Abdominal: Soft. Bowel sounds are normal. She exhibits no distension. There is no tenderness.  Musculoskeletal: Normal range of motion.  Neurological: She is alert and oriented to person, place, and time.  Skin: Skin is warm and dry. She is not diaphoretic.  Nursing note and vitals reviewed.    ED Treatments / Results  Labs (all labs ordered are listed, but only abnormal results are displayed) Labs Reviewed  COMPREHENSIVE METABOLIC PANEL  CBC WITH DIFFERENTIAL/PLATELET  Randolm Idol, ED  I-STAT CG4 LACTIC ACID, ED    EKG  EKG Interpretation  Date/Time:  Tuesday September 21 2015 10:25:23 EDT Ventricular Rate:  106 PR Interval:    QRS Duration: 78 QT Interval:  335 QTC Calculation: 445 R Axis:   55 Text Interpretation:  Sinus tachycardia Left atrial enlargement Anterior infarct, old Minimal ST depression, lateral leads Confirmed by Lindsey Demonte  MD, Dusty Raczkowski ([id number]) on 09/21/2015 10:38:42 AM Also confirmed by Stark Jock  MD, Swan Zayed ([id number]), editor Rolla Plate, Joelene Millin [phone])  on 09/21/2015 10:44:54 AM       Radiology No results found.  Procedures Procedures (including critical care time)  Medications Ordered in ED Medications  HYDROmorphone (DILAUDID) injection 1 mg (not administered)  sodium chloride 0.9 % bolus 500 mL (not administered)     Initial Impression / Assessment and Plan / ED Course  I have reviewed the triage vital signs and the nursing notes.  Pertinent labs & imaging results that  were available during my care of the patient were reviewed by me and considered in my medical decision making (see chart for details).  Clinical Course    Laboratory studies essentially unchanged and chest x-ray shows no new findings. The  patient has multiple complaints, all of which are difficult to explain. I've discussed her care with Dr. Alvy Bimler from oncology who does not feel as though admission is indicated. The patient has an appointment with her oncologist tomorrow and I have advised her to keep this appointment.  She was initially telling me that she was too weak to go home. Consult to social services was obtained who gave the family members information regarding extended care facilities.  Final Clinical Impressions(s) / ED Diagnoses   Final diagnoses:  None    New Prescriptions New Prescriptions   No medications on file     Veryl Speak, MD 09/21/15 1300

## 2015-09-21 NOTE — ED Triage Notes (Signed)
Pt from home reports shortness of breath and chest pain x 1 day. Ca pt , receives treatment , last chemo 11/16 per pt. Alert and oriented x 4.

## 2015-09-21 NOTE — Assessment & Plan Note (Signed)
She has recent skin biopsy at [facility] at, from diagnosis of dermatomyositis. For now, I recommend she continues taking prednisone. I recommend his son to call rheumatologist at [facility] for further treatment recommendation

## 2015-09-21 NOTE — Assessment & Plan Note (Signed)
The feeding tube site looks okay without signs of infection. She will continue nutritional supplement as directed

## 2015-09-21 NOTE — Assessment & Plan Note (Signed)
She has chronic dysphagia with possible risk of aspiration. The cause is unknown but that could be related to esophageal stricture. Neuromuscular involvement of dermatomyositis cannot be excluded She is dependent on feeding tube.

## 2015-09-21 NOTE — Assessment & Plan Note (Signed)
The patient has frequent visits to the emergency department for many reasons. Her last CT angiogram early this month showed no definitive signs of cancer recurrence. The abnormal changes that was described on the CT imaging appears to be focal atelectasis/infection of which she is adequately treated with antibiotic therapy. I recommend repeat imaging study again in 3 months for further assessment

## 2015-09-21 NOTE — Discharge Instructions (Signed)
Follow-up with Dr. Alvy Bimler tomorrow as scheduled.

## 2015-09-21 NOTE — Telephone Encounter (Signed)
LVM for son on his cell phone informing Dr. Alvy Bimler aware of pt's ED visit.   Dr. Alvy Bimler can see pt in office today if pt is released from ED.  Please call nurse back to confirm if pt is released from ED she can come see Dr. Alvy Bimler at [facility].  VM from Triage that Montine Circle called back.  I tried to call his cell phone again and there is no answer.

## 2015-09-21 NOTE — Patient Instructions (Signed)

## 2015-09-21 NOTE — Assessment & Plan Note (Signed)
This is likely anemia of chronic disease. The patient denies recent history of bleeding such as epistaxis, hematuria or hematochezia. She is asymptomatic from the anemia. We will observe for now.  She does not require transfusion now.   

## 2015-09-21 NOTE — Telephone Encounter (Signed)
8/29 daughter left a  Message that they needed to cx appt - mom is sick and they are taking her to the ED

## 2015-09-21 NOTE — Telephone Encounter (Signed)
Gave patient avs report and appointments for September.  °

## 2015-09-21 NOTE — Assessment & Plan Note (Signed)
She have diffuse muscle pain and neck pain. I suspect part of the poorly controlled pain could be due to a flare of dermatomyositis from recent prednisone taper. I recommend starting her on fentanyl patch and increasing the dose of liquid morphine. I will reassess pain control when I see her back in 2 weeks

## 2015-09-21 NOTE — Telephone Encounter (Signed)
Son had called back but by this time Gayleen Orem had gone over to ED and informed family of appt here w/ Dr. Alvy Bimler.   Son is not w/ pt in ED.  He verbalized understanding.

## 2015-09-21 NOTE — Progress Notes (Signed)
CSW spoke with patient at bedside with husband Megan Sims present. Patient reports she had different people coming in and out to care for her but she no longer has the assistance. Patient's husband states he has to work and is unable to care for patient from day to day. CSW spoke with patient and husband regarding resources for facilities. They were receptive to have the information. CSW provided resources for SNF and ALF and informed patient, husband, and patient's daughter if they chose to place patient to connect with patient's PCP to facilitate the placement. No questions noted for CSW at this time. Staffed this information with EDP and Nurse.    Megan Sims [phone] ED CSW 09/21/2015 3:15 PM

## 2015-09-22 ENCOUNTER — Ambulatory Visit (HOSPITAL_COMMUNITY): Payer: Medicaid Other | Admitting: Speech Pathology

## 2015-09-22 ENCOUNTER — Other Ambulatory Visit: Payer: Medicaid Other

## 2015-09-22 ENCOUNTER — Ambulatory Visit: Payer: Medicaid Other | Admitting: Hematology and Oncology

## 2015-09-22 ENCOUNTER — Encounter: Payer: Self-pay | Admitting: Gastroenterology

## 2015-09-22 NOTE — Assessment & Plan Note (Signed)
She has clinical signs of dehydration due to recent diarrhea. I recommend Imodium for diarrhea and I will give her some IV fluids before discharging her from the clinic

## 2015-09-22 NOTE — Progress Notes (Signed)
[place] OFFICE PROGRESS NOTE  Patient Care Team: Jani Gravel, MD as PCP - General (Internal Medicine) Heath Lark, MD as Consulting Physician (Hematology and Oncology) Jodi Marble, MD as Consulting Physician (Otolaryngology) Eppie Gibson, MD as Attending Physician (Radiation Oncology)  SUMMARY OF ONCOLOGIC HISTORY: Oncology History   Bilateral lung cancer   Staging form: Lung, AJCC 6th Edition     Clinical stage from 10/01/2014: Stage IIIB (T4(m), N3, M0) - Signed by Heath Lark, MD on 10/01/2014 Cancer of upper lobe of right lung   Staging form: Lung, AJCC 7th Edition     Clinical: Stage Unknown (TX, N3, M0) - Signed by Eppie Gibson, MD on 10/07/2014       Bilateral lung cancer ([place])   09/05/2014 - 09/12/2014 Hospital Admission    The patient was admitted to the hospital for further management of possible skin reaction, dysphagia and supraclavicular lymphadenopathy      09/06/2014 Imaging    CT scan of the neck showed malignant appearing superior mediastinal and right thoracic inlet lymphadenopathy      09/10/2014 Imaging    CT scan of the abdomen and pelvis confirmed right supraclavicular and mediastinal lymphadenopathy, as described above but no other evidence of metastatic cancer       09/14/2014 Procedure    Flexible endoscopy in the ENT office failed to reveal any primary in the head and neck region      09/16/2014 Imaging    She failed barium swallow. 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      01/28/2015 Imaging    CT scan showed no active disease      04/30/2015 Imaging    Mild patchy/ground-glass opacity in the right upper lung lobe, favored to reflect mild residual pneumonia. Associated 10 x 15 mm irregular nodular opacity in the right upper lobe, new, likely infectious.       06/14/2015 Imaging    CT neck nonspecific inflammation. CT chest showed Small focal opacities along the right minor fissure and at the right upper lobe are similar in appearance to the prior CTA, though less hazy      06/16/2015 Procedure    She had EGD and biopsy. EGD showed edematous cricopharyngeus      07/07/2015 Surgery    She has PEG tube placement      07/07/2015 Procedure    EGD showed moderate duodenitis and ulcer      08/02/2015 Imaging    HF:SFSELTRV development of complete right middle lobe collapse without  a discrete central obstructing lesion evident. The irregular ill-defined central airspace opacity identified previously in the posterior right upper lobe is not substantially changed       INTERVAL HISTORY: Please see below for problem oriented charting. She is seen urgently today after discharge from the emergency department. She complained of weakness, dysphagia, recent diarrhea and felt weak. She have diffuse swelling but recent ultrasound Doppler excluded DVT. She is dependent on feeding tube. She had numerous imaging studies done. 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  She denies fever or chills. She had mild  nonproductive cough. She has diffuse muscle and bone pain, poorly controlled with current prescription Roxanol  REVIEW OF SYSTEMS:   Constitutional: Denies fevers, chills or abnormal weight loss Eyes: Denies blurriness of vision Ears, nose, mouth, throat, and face: Denies mucositis or sore throat Lymphatics: Denies new lymphadenopathy or easy bruising Behavioral/Psych: Mood is stable, no new changes  All other systems were reviewed with the patient and are negative.  I have reviewed the past medical history, past surgical history, social history and family history with the patient and they are unchanged from previous note.  ALLERGIES:  is allergic to hydrocodone-acetaminophen; demeclocycline; tetracyclines & related; and tramadol.  MEDICATIONS:  Current Outpatient Prescriptions  Medication Sig Dispense Refill  . albuterol (PROVENTIL HFA;VENTOLIN HFA) 108 (90 Base) MCG/ACT inhaler Inhale 1-2 puffs into the lungs every 6 (six) hours as needed for wheezing or shortness of breath.    Marland Kitchen amLODipine (NORVASC) 10 MG tablet Take 1 tablet (10 mg total) by mouth daily. (Patient taking differently: Take 5 mg by mouth daily. )    . fentaNYL (DURAGESIC - DOSED MCG/HR) 25 MCG/HR patch Place 1 patch (25 mcg total) onto the skin every 3 (three) days. 5 patch 0  . HYDROcodone-acetaminophen (NORCO/VICODIN) 5-325 MG tablet Take 1-2 tablets by mouth every 6 (six) hours as needed for moderate pain. (Patient not taking: Reported on 08/27/2015) 240 tablet 0  . hydrocortisone 2.5 % ointment Apply 1 application topically 2 (two) times daily. To face  3  . metoprolol tartrate (LOPRESSOR) 25 MG tablet Take 0.5 tablets (12.5 mg total) by mouth 2 (two) times daily. 60 tablet 0  . morphine (ROXANOL) 20 MG/ML concentrated solution Place 1 mL (20 mg total) into feeding tube every 2 (two) hours as needed for severe pain. 240 mL 0  . Nutritional Supplements (FEEDING SUPPLEMENT, JEVITY 1.2 CAL,) LIQD Place 237 mLs into feeding  tube every 3 (three) hours. (Patient taking differently: Place 391.5 mLs into feeding tube every 3 (three) hours. ) 237 mL 3  . nystatin (MYCOSTATIN) 100000 UNIT/ML suspension Take 5 mLs by mouth 4 (four) times daily. Swish and spit for thrush    . predniSONE (DELTASONE) 20 MG tablet Take 1 tablet (20 mg total) by mouth daily with breakfast. (Patient not taking: Reported on 09/21/2015) 30 tablet 0  . predniSONE (DELTASONE) 5 MG tablet Take 5 mg by mouth daily with breakfast. Take by mouth once daily as directed: take '15mg'$  for 3 days, then take '10mg'$  for 3 days then take '5mg'$  for 3 days then take 2.'5mg'$  for 3 days then stop..    . sucralfate (CARAFATE) 1 GM/10ML suspension Take 10 mLs (1 g total) by mouth 4 (four) times daily -  with meals and at bedtime. (Patient not taking: Reported on 08/27/2015) 420 mL 9  . triamcinolone ointment (KENALOG) 0.1 % Apply  1 application topically 2 (two) times daily. From neck down. Do not use on face..  11  . zolpidem (AMBIEN) 10 MG tablet Take 10 mg by mouth at bedtime as needed for sleep.     Current Facility-Administered Medications  Medication Dose Route Frequency Provider Last Rate Last Dose  . HYDROmorphone (DILAUDID) injection 2 mg  2 mg Intravenous Q2H PRN Heath Lark, MD   2 mg at 09/21/15 1430  . sodium chloride 0.9 % injection 10 mL  10 mL Intravenous PRN Heath Lark, MD   10 mL at 09/21/15 1655    PHYSICAL EXAMINATION: ECOG PERFORMANCE STATUS: 2 - Symptomatic, <50% confined to bed  Vitals:   09/21/15 1411  BP: (!) 149/71  Pulse: (!) 110  Resp: 17  Temp: 98.4 F (36.9 C)   Filed Weights   09/21/15 1411  Weight: 121 lb (54.9 kg)    GENERAL:alert, no distress and comfortable. She looks debilitated SKIN: She has significant rash throughout EYES: normal, Conjunctiva are pink and non-injected, sclera clear OROPHARYNX: She has dry mucous membrane  NECK: supple, thyroid normal size, non-tender, without nodularity LYMPH:  no palpable lymphadenopathy in  the cervical, axillary or inguinal LUNGS: clear to auscultation and percussion with normal breathing effort HEART: Tachycardia, no murmurs, noted left upper extremity swelling and mild bilateral ankle swelling ABDOMEN:abdomen soft, non-tender and normal bowel sounds. Feeding tube site looks okay Musculoskeletal:no cyanosis of digits and no clubbing  NEURO: alert & oriented x 3 with fluent speech, no focal motor/sensory deficits  LABORATORY DATA:  I have reviewed the data as listed    Component Value Date/Time   NA 135 09/21/2015 1045   NA 139 08/02/2015 1017   K 4.0 09/21/2015 1045   K 4.2 08/02/2015 1017   CL 101 09/21/2015 1045   CO2 25 09/21/2015 1045   CO2 27 08/02/2015 1017   GLUCOSE 98 09/21/2015 1045   GLUCOSE 97 08/02/2015 1017   BUN 39 (H) 09/21/2015 1045   BUN 25.4 08/02/2015 1017   CREATININE 0.78 09/21/2015 1045   CREATININE 0.9 08/02/2015 1017   CALCIUM 8.7 (L) 09/21/2015 1045   CALCIUM 8.9 08/02/2015 1017   PROT 5.8 (L) 09/21/2015 1045   PROT 6.0 (L) 08/02/2015 1017   ALBUMIN 3.0 (L) 09/21/2015 1045   ALBUMIN 2.8 (L) 08/02/2015 1017   AST 51 (H) 09/21/2015 1045   AST 56 (H) 08/02/2015 1017   ALT 17 09/21/2015 1045   ALT 16 08/02/2015 1017   ALKPHOS 65 09/21/2015 1045   ALKPHOS 78 08/02/2015 1017   BILITOT 0.9 09/21/2015 1045   BILITOT 0.37 08/02/2015 1017   GFRNONAA >60 09/21/2015 1045   GFRAA >60 09/21/2015 1045    No results found for: SPEP, UPEP  Lab Results  Component Value Date   WBC 11.2 (H) 09/21/2015   NEUTROABS 9.1 (H) 09/21/2015   HGB 9.6 (L) 09/21/2015   HCT 30.0 (L) 09/21/2015   MCV 94.9 09/21/2015   PLT 183 09/21/2015      Chemistry      Component Value Date/Time   NA 135 09/21/2015 1045   NA 139 08/02/2015 1017   K 4.0 09/21/2015 1045   K 4.2 08/02/2015 1017   CL 101 09/21/2015 1045   CO2 25 09/21/2015 1045   CO2 27 08/02/2015 1017   BUN 39 (H) 09/21/2015 1045   BUN 25.4 08/02/2015 1017   CREATININE 0.78 09/21/2015 1045    CREATININE 0.9 08/02/2015 1017      Component Value Date/Time  CALCIUM 8.7 (L) 09/21/2015 1045   CALCIUM 8.9 08/02/2015 1017   ALKPHOS 65 09/21/2015 1045   ALKPHOS 78 08/02/2015 1017   AST 51 (H) 09/21/2015 1045   AST 56 (H) 08/02/2015 1017   ALT 17 09/21/2015 1045   ALT 16 08/02/2015 1017   BILITOT 0.9 09/21/2015 1045   BILITOT 0.37 08/02/2015 1017       RADIOGRAPHIC STUDIES: I have personally reviewed the radiological images as listed and agreed with the findings in the report. Dg Chest 2 View  Result Date: 09/21/2015 CLINICAL DATA:  Pt takes HTN meds. Not diabetic. Current long time smoker. Coughing. Congestion. Nausea and vomiting. Central chest pains for a few days. EXAM: CHEST  2 VIEW COMPARISON:  09/15/2015 FINDINGS: Cardiac silhouette is normal in size and configuration. No mediastinal or hilar masses or evidence of adenopathy. There is opacity along the oblique fissure projecting within the right middle lobe, stable the prior study. Lungs otherwise clear. No pleural effusion or pneumothorax. Left anterior chest wall Port-A-Cath is stable. Skeletal structures are intact. IMPRESSION: 1. Right middle lobe consolidation/atelectasis bordering the oblique fissure is stable from the prior study. 2. Remainder of the lungs is clear. Overall no change from the prior study. Electronically Signed   By: Lajean Manes M.D.   On: 09/21/2015 11:19     ASSESSMENT & PLAN:  Bilateral lung cancer [facility]) The patient has frequent visits to the emergency department for many reasons. Her last CT angiogram early this month showed no definitive signs of cancer recurrence. The abnormal changes that was described on the CT imaging appears to be focal atelectasis/infection of which she is adequately treated with antibiotic therapy. I recommend repeat imaging study again in 3 months for further assessment  Dermatomyositis associated with neoplastic disease [facility]) She has recent skin biopsy at [facility]  at, from diagnosis of dermatomyositis. For now, I recommend she continues taking prednisone. I recommend his son to call rheumatologist at [facility] for further treatment recommendation  Chronic neck pain She have diffuse muscle pain and neck pain. I suspect part of the poorly controlled pain could be due to a flare of dermatomyositis from recent prednisone taper. I recommend starting her on fentanyl patch and increasing the dose of liquid morphine. I will reassess pain control when I see her back in 2 weeks  Dysphagia She has chronic dysphagia with possible risk of aspiration. The cause is unknown but that could be related to esophageal stricture. Neuromuscular involvement of dermatomyositis cannot be excluded She is dependent on feeding tube.  S/P gastrostomy ([place]) The feeding tube site looks okay without signs of infection. She will continue nutritional supplement as directed  Deficiency anemia This is likely anemia of chronic disease. The patient denies recent history of bleeding such as epistaxis, hematuria or hematochezia. She is asymptomatic from the anemia. We will observe for now.  She does not require transfusion now.   Dehydration She has clinical signs of dehydration due to recent diarrhea. I recommend Imodium for diarrhea and I will give her some IV fluids before discharging her from the clinic   Orders Placed This Encounter  Procedures  . CBC with Differential/Platelet    Standing Status:   Future    Standing Expiration Date:   10/25/2016  . Comprehensive metabolic panel    Standing Status:   Future    Standing Expiration Date:   10/25/2016  . Lactate dehydrogenase    Standing Status:   Future    Standing Expiration  Date:   10/25/2016  . CK, total    Standing Status:   Future    Standing Expiration Date:   10/25/2016   All questions were answered. The patient knows to call the clinic with any problems, questions or concerns. No barriers to learning was detected. I  spent 40 minutes counseling the patient face to face. The total time spent in the appointment was 55 minutes and more than 50% was on counseling and review of test results     [facility], [place], MD 09/22/2015 10:29 AM

## 2015-09-23 ENCOUNTER — Telehealth: Payer: Self-pay | Admitting: *Deleted

## 2015-09-23 NOTE — Telephone Encounter (Signed)
"  I need to know what will be done for my mother's appointments she has two infusion appointments."  Advised the first is for lab draw due to flush nurse overfill.  No further questions.

## 2015-09-24 ENCOUNTER — Telehealth: Payer: Self-pay | Admitting: *Deleted

## 2015-09-24 ENCOUNTER — Telehealth: Payer: Self-pay

## 2015-09-24 DIAGNOSIS — M360 Dermato(poly)myositis in neoplastic disease: Secondary | ICD-10-CM

## 2015-09-24 DIAGNOSIS — C3492 Malignant neoplasm of unspecified part of left bronchus or lung: Principal | ICD-10-CM

## 2015-09-24 DIAGNOSIS — I10 Essential (primary) hypertension: Secondary | ICD-10-CM

## 2015-09-24 DIAGNOSIS — C3491 Malignant neoplasm of unspecified part of right bronchus or lung: Secondary | ICD-10-CM

## 2015-09-24 MED ORDER — PREDNISONE 5 MG PO TABS: ORAL_TABLET | ORAL | 0 refills | Status: AC

## 2015-09-24 NOTE — Telephone Encounter (Signed)
Pt currently has [facility] for Nursing home care. Called [facility] to order/add Physical Therapy services. S/w Antony Madura, RN,  Who says they have evaluated pt for PT and OT already and Medicaid did not approve any PT or OT services.  She recommends we send a order with the new diagnosis of Dermatomyositis with Dr. Calton Dach signature and most recent office note to support need.  They will send PT out to re evaluate and see if they can get Medicaid approval considering her new diagnosis.   [facility] phone number is [phone].  Fax number is [phone].

## 2015-09-24 NOTE — Telephone Encounter (Signed)
Instructed son that Dr. Alvy Bimler s/w Rheumatologist at [facility].  They instructed pt go back up to 15 mg of Prednisone daily for 3 days then down to 10 mg daily until pt sees Rheumatologist.  New Rx Prednisone 5 mg tablets sent to pt's pharmacy.  Son understands pt to take 3 tablets for 3 days then 2 daily until further instructions from Rheumatology.  He says pt has appt on 9/13 or 9/14.

## 2015-09-24 NOTE — Telephone Encounter (Signed)
Daughter Maudie Mercury called to let Dr Alvy Bimler know that another doctor prescribed a fluid pill for swelling in ankles and feet and L arm.

## 2015-09-28 ENCOUNTER — Inpatient Hospital Stay (HOSPITAL_COMMUNITY)
Admission: EM | Admit: 2015-09-28 | Discharge: 2015-09-30 | DRG: 383 | Disposition: A | Discharge status: Home / Self Care | Payer: Medicaid Other | Attending: Internal Medicine | Admitting: Internal Medicine

## 2015-09-28 ENCOUNTER — Encounter (HOSPITAL_COMMUNITY): Payer: Self-pay

## 2015-09-28 ENCOUNTER — Telehealth: Payer: Self-pay | Admitting: *Deleted

## 2015-09-28 ENCOUNTER — Emergency Department (HOSPITAL_COMMUNITY): Payer: Medicaid Other

## 2015-09-28 ENCOUNTER — Other Ambulatory Visit: Payer: Self-pay

## 2015-09-28 DIAGNOSIS — K222 Esophageal obstruction: Secondary | ICD-10-CM | POA: Diagnosis present

## 2015-09-28 DIAGNOSIS — Z808 Family history of malignant neoplasm of other organs or systems: Secondary | ICD-10-CM | POA: Diagnosis not present

## 2015-09-28 DIAGNOSIS — K210 Gastro-esophageal reflux disease with esophagitis: Secondary | ICD-10-CM | POA: Diagnosis present

## 2015-09-28 DIAGNOSIS — D62 Acute posthemorrhagic anemia: Secondary | ICD-10-CM | POA: Diagnosis present

## 2015-09-28 DIAGNOSIS — C3490 Malignant neoplasm of unspecified part of unspecified bronchus or lung: Secondary | ICD-10-CM | POA: Diagnosis present

## 2015-09-28 DIAGNOSIS — J439 Emphysema, unspecified: Secondary | ICD-10-CM | POA: Diagnosis present

## 2015-09-28 DIAGNOSIS — N179 Acute kidney failure, unspecified: Secondary | ICD-10-CM | POA: Diagnosis not present

## 2015-09-28 DIAGNOSIS — L89312 Pressure ulcer of right buttock, stage 2: Secondary | ICD-10-CM | POA: Diagnosis present

## 2015-09-28 DIAGNOSIS — Z9221 Personal history of antineoplastic chemotherapy: Secondary | ICD-10-CM

## 2015-09-28 DIAGNOSIS — L8990 Pressure ulcer of unspecified site, unspecified stage: Secondary | ICD-10-CM | POA: Insufficient documentation

## 2015-09-28 DIAGNOSIS — M360 Dermato(poly)myositis in neoplastic disease: Secondary | ICD-10-CM | POA: Diagnosis present

## 2015-09-28 DIAGNOSIS — I10 Essential (primary) hypertension: Secondary | ICD-10-CM | POA: Diagnosis present

## 2015-09-28 DIAGNOSIS — R0600 Dyspnea, unspecified: Secondary | ICD-10-CM | POA: Diagnosis not present

## 2015-09-28 DIAGNOSIS — Z8249 Family history of ischemic heart disease and other diseases of the circulatory system: Secondary | ICD-10-CM | POA: Diagnosis not present

## 2015-09-28 DIAGNOSIS — Z85118 Personal history of other malignant neoplasm of bronchus and lung: Secondary | ICD-10-CM

## 2015-09-28 DIAGNOSIS — Z881 Allergy status to other antibiotic agents status: Secondary | ICD-10-CM

## 2015-09-28 DIAGNOSIS — R1313 Dysphagia, pharyngeal phase: Secondary | ICD-10-CM | POA: Diagnosis present

## 2015-09-28 DIAGNOSIS — K263 Acute duodenal ulcer without hemorrhage or perforation: Principal | ICD-10-CM | POA: Diagnosis present

## 2015-09-28 DIAGNOSIS — Y95 Nosocomial condition: Secondary | ICD-10-CM | POA: Diagnosis present

## 2015-09-28 DIAGNOSIS — A419 Sepsis, unspecified organism: Secondary | ICD-10-CM | POA: Diagnosis not present

## 2015-09-28 DIAGNOSIS — K922 Gastrointestinal hemorrhage, unspecified: Secondary | ICD-10-CM | POA: Diagnosis present

## 2015-09-28 DIAGNOSIS — R579 Shock, unspecified: Secondary | ICD-10-CM | POA: Diagnosis not present

## 2015-09-28 DIAGNOSIS — J189 Pneumonia, unspecified organism: Secondary | ICD-10-CM

## 2015-09-28 DIAGNOSIS — Z923 Personal history of irradiation: Secondary | ICD-10-CM | POA: Diagnosis not present

## 2015-09-28 DIAGNOSIS — Z833 Family history of diabetes mellitus: Secondary | ICD-10-CM

## 2015-09-28 DIAGNOSIS — K921 Melena: Secondary | ICD-10-CM | POA: Diagnosis present

## 2015-09-28 DIAGNOSIS — K269 Duodenal ulcer, unspecified as acute or chronic, without hemorrhage or perforation: Secondary | ICD-10-CM | POA: Diagnosis not present

## 2015-09-28 DIAGNOSIS — F17210 Nicotine dependence, cigarettes, uncomplicated: Secondary | ICD-10-CM | POA: Diagnosis present

## 2015-09-28 DIAGNOSIS — Z931 Gastrostomy status: Secondary | ICD-10-CM

## 2015-09-28 DIAGNOSIS — R571 Hypovolemic shock: Secondary | ICD-10-CM | POA: Diagnosis not present

## 2015-09-28 DIAGNOSIS — K264 Chronic or unspecified duodenal ulcer with hemorrhage: Secondary | ICD-10-CM | POA: Diagnosis not present

## 2015-09-28 DIAGNOSIS — J9600 Acute respiratory failure, unspecified whether with hypoxia or hypercapnia: Secondary | ICD-10-CM | POA: Diagnosis not present

## 2015-09-28 DIAGNOSIS — Z79899 Other long term (current) drug therapy: Secondary | ICD-10-CM

## 2015-09-28 DIAGNOSIS — Z7952 Long term (current) use of systemic steroids: Secondary | ICD-10-CM

## 2015-09-28 DIAGNOSIS — Z885 Allergy status to narcotic agent status: Secondary | ICD-10-CM

## 2015-09-28 DIAGNOSIS — D539 Nutritional anemia, unspecified: Secondary | ICD-10-CM | POA: Diagnosis not present

## 2015-09-28 DIAGNOSIS — J9601 Acute respiratory failure with hypoxia: Secondary | ICD-10-CM | POA: Diagnosis not present

## 2015-09-28 DIAGNOSIS — R531 Weakness: Secondary | ICD-10-CM | POA: Diagnosis present

## 2015-09-28 DIAGNOSIS — C3411 Malignant neoplasm of upper lobe, right bronchus or lung: Secondary | ICD-10-CM | POA: Diagnosis not present

## 2015-09-28 DIAGNOSIS — D649 Anemia, unspecified: Secondary | ICD-10-CM

## 2015-09-28 DIAGNOSIS — C3491 Malignant neoplasm of unspecified part of right bronchus or lung: Secondary | ICD-10-CM | POA: Diagnosis not present

## 2015-09-28 LAB — URINALYSIS, ROUTINE W REFLEX MICROSCOPIC
BILIRUBIN URINE: NEGATIVE
Glucose, UA: NEGATIVE mg/dL
KETONES UR: NEGATIVE mg/dL
Leukocytes, UA: NEGATIVE
NITRITE: NEGATIVE
PROTEIN: NEGATIVE mg/dL
Specific Gravity, Urine: 1.014 (ref 1.005–1.030)
pH: 5.5 (ref 5.0–8.0)

## 2015-09-28 LAB — COMPREHENSIVE METABOLIC PANEL
ALT: 14 U/L (ref 14–54)
AST: 50 U/L — AB (ref 15–41)
Albumin: 2.8 g/dL — ABNORMAL LOW (ref 3.5–5.0)
Alkaline Phosphatase: 49 U/L (ref 38–126)
Anion gap: 11 (ref 5–15)
BILIRUBIN TOTAL: 0.2 mg/dL — AB (ref 0.3–1.2)
BUN: 82 mg/dL — AB (ref 6–20)
CHLORIDE: 98 mmol/L — AB (ref 101–111)
CO2: 26 mmol/L (ref 22–32)
CREATININE: 0.97 mg/dL (ref 0.44–1.00)
Calcium: 8.2 mg/dL — ABNORMAL LOW (ref 8.9–10.3)
GFR calc Af Amer: >60 mL/min (ref >60)
Glucose, Bld: 145 mg/dL — ABNORMAL HIGH (ref 65–99)
Potassium: 4.5 mmol/L (ref 3.5–5.1)
Sodium: 135 mmol/L (ref 135–145)
Total Protein: 5.3 g/dL — ABNORMAL LOW (ref 6.5–8.1)

## 2015-09-28 LAB — CBC WITH DIFFERENTIAL/PLATELET
BASOS ABS: 0 10*3/uL (ref 0.0–0.1)
Basophils Relative: 0 %
EOS ABS: 0 10*3/uL (ref 0.0–0.7)
EOS PCT: 0 %
HCT: 13 % — ABNORMAL LOW (ref 36.0–46.0)
HEMOGLOBIN: 4.1 g/dL — AB (ref 12.0–15.0)
Lymphocytes Relative: 4 %
Lymphs Abs: 0.6 10*3/uL — ABNORMAL LOW (ref 0.7–4.0)
MCH: 29.7 pg (ref 26.0–34.0)
MCHC: 31.5 g/dL (ref 30.0–36.0)
MCV: 94.2 fL (ref 78.0–100.0)
MONO ABS: 1 10*3/uL (ref 0.1–1.0)
MONOS PCT: 7 %
MYELOCYTES: 3 %
Metamyelocytes Relative: 1 %
Neutro Abs: 13.2 10*3/uL — ABNORMAL HIGH (ref 1.7–7.7)
Neutrophils Relative %: 85 %
PLATELETS: 197 10*3/uL (ref 150–400)
RBC: 1.38 MIL/uL — AB (ref 3.87–5.11)
RDW: 19 % — AB (ref 11.5–15.5)
WBC: 14.8 10*3/uL — AB (ref 4.0–10.5)
nRBC: 30 /100 WBC — ABNORMAL HIGH

## 2015-09-28 LAB — URINE MICROSCOPIC-ADD ON: WBC UA: NONE SEEN WBC/hpf (ref 0–5)

## 2015-09-28 LAB — POC OCCULT BLOOD, ED: FECAL OCCULT BLD: POSITIVE — AB

## 2015-09-28 LAB — APTT: aPTT: 27 seconds (ref 24–36)

## 2015-09-28 LAB — PROTIME-INR
INR: 1.07
Prothrombin Time: 13.9 seconds (ref 11.4–15.2)

## 2015-09-28 LAB — I-STAT CG4 LACTIC ACID, ED: Lactic Acid, Venous: 4.83 mmol/L (ref 0.5–1.9)

## 2015-09-28 LAB — MRSA PCR SCREENING: MRSA BY PCR: NEGATIVE

## 2015-09-28 LAB — I-STAT BETA HCG BLOOD, ED (MC, WL, AP ONLY)

## 2015-09-28 LAB — PREPARE RBC (CROSSMATCH)

## 2015-09-28 MED ORDER — VANCOMYCIN HCL 500 MG IV SOLR
500.0000 mg | Freq: Two times a day (BID) | INTRAVENOUS | Status: DC
  Administered 2015-09-29 – 2015-09-30 (×2): 500 mg via INTRAVENOUS
  Filled 2015-09-28 (×3): qty 500

## 2015-09-28 MED ORDER — FENTANYL 25 MCG/HR TD PT72
25.0000 ug | MEDICATED_PATCH | TRANSDERMAL | Status: DC
  Administered 2015-09-28: 25 ug via TRANSDERMAL
  Filled 2015-09-28: qty 1

## 2015-09-28 MED ORDER — HYDROCORTISONE NA SUCCINATE PF 100 MG IJ SOLR
100.0000 mg | Freq: Once | INTRAMUSCULAR | Status: AC
  Administered 2015-09-28: 100 mg via INTRAVENOUS
  Filled 2015-09-28: qty 2

## 2015-09-28 MED ORDER — MORPHINE SULFATE (CONCENTRATE) 10 MG/0.5ML PO SOLN
20.0000 mg | ORAL | Status: DC | PRN
Start: 2015-09-28 — End: 2015-09-30
  Administered 2015-09-29 (×2): 20 mg
  Filled 2015-09-28 (×2): qty 1

## 2015-09-28 MED ORDER — ONDANSETRON HCL 4 MG PO TABS: 4.0000 mg | ORAL_TABLET | Freq: Four times a day (QID) | ORAL | Status: DC | PRN

## 2015-09-28 MED ORDER — DEXTROSE 5 % IV SOLN
1.0000 g | Freq: Once | INTRAVENOUS | Status: AC
  Administered 2015-09-28: 1 g via INTRAVENOUS
  Filled 2015-09-28: qty 1

## 2015-09-28 MED ORDER — SODIUM CHLORIDE 0.9 % IV SOLN: 10.0000 mL/h | Freq: Once | INTRAVENOUS | Status: DC

## 2015-09-28 MED ORDER — ORAL CARE MOUTH RINSE
15.0000 mL | Freq: Two times a day (BID) | OROMUCOSAL | Status: DC
  Administered 2015-09-28 – 2015-09-29 (×2): 15 mL via OROMUCOSAL

## 2015-09-28 MED ORDER — ALBUTEROL SULFATE (2.5 MG/3ML) 0.083% IN NEBU: 3.0000 mL | INHALATION_SOLUTION | Freq: Four times a day (QID) | RESPIRATORY_TRACT | Status: DC | PRN

## 2015-09-28 MED ORDER — DEXTROSE 5 % IV SOLN: 1.0000 g | Freq: Three times a day (TID) | INTRAVENOUS | Status: DC

## 2015-09-28 MED ORDER — TRIAMCINOLONE ACETONIDE 0.1 % EX OINT
1.0000 "application " | TOPICAL_OINTMENT | Freq: Two times a day (BID) | CUTANEOUS | Status: DC
  Administered 2015-09-29 – 2015-09-30 (×3): 1 via TOPICAL
  Filled 2015-09-28: qty 15

## 2015-09-28 MED ORDER — DEXTROSE 5 % IV SOLN
1.0000 g | Freq: Once | INTRAVENOUS | Status: AC
  Filled 2015-09-28: qty 1

## 2015-09-28 MED ORDER — PANTOPRAZOLE SODIUM 40 MG IV SOLR
40.0000 mg | Freq: Once | INTRAVENOUS | Status: AC
  Administered 2015-09-28: 40 mg via INTRAVENOUS

## 2015-09-28 MED ORDER — VANCOMYCIN HCL IN DEXTROSE 1-5 GM/200ML-% IV SOLN
1000.0000 mg | Freq: Once | INTRAVENOUS | Status: AC
  Administered 2015-09-28: 1000 mg via INTRAVENOUS
  Filled 2015-09-28: qty 200

## 2015-09-28 MED ORDER — SODIUM CHLORIDE 0.9 % IV SOLN
INTRAVENOUS | Status: AC
  Administered 2015-09-28 – 2015-09-29 (×2): via INTRAVENOUS

## 2015-09-28 MED ORDER — HYDROCORTISONE NA SUCCINATE PF 100 MG IJ SOLR
50.0000 mg | Freq: Three times a day (TID) | INTRAMUSCULAR | Status: DC
  Administered 2015-09-28 – 2015-09-30 (×5): 50 mg via INTRAVENOUS
  Filled 2015-09-28 (×5): qty 2

## 2015-09-28 MED ORDER — SODIUM CHLORIDE 0.9 % IV BOLUS (SEPSIS)
1000.0000 mL | Freq: Once | INTRAVENOUS | Status: DC
  Administered 2015-09-28: 1000 mL via INTRAVENOUS

## 2015-09-28 MED ORDER — PANTOPRAZOLE SODIUM 40 MG IV SOLR
40.0000 mg | Freq: Once | INTRAVENOUS | Status: DC
  Filled 2015-09-28: qty 40

## 2015-09-28 MED ORDER — SODIUM CHLORIDE 0.9 % IV BOLUS (SEPSIS)
1000.0000 mL | Freq: Once | INTRAVENOUS | Status: AC
  Administered 2015-09-28: 1000 mL via INTRAVENOUS

## 2015-09-28 MED ORDER — ONDANSETRON HCL 4 MG/2ML IJ SOLN: 4.0000 mg | Freq: Four times a day (QID) | INTRAMUSCULAR | Status: DC | PRN

## 2015-09-28 MED ORDER — SODIUM CHLORIDE 0.9 % IV SOLN
8.0000 mg/h | INTRAVENOUS | Status: DC
  Administered 2015-09-28 – 2015-09-29 (×2): 8 mg/h via INTRAVENOUS
  Filled 2015-09-28 (×4): qty 80

## 2015-09-28 MED ORDER — DEXTROSE 5 % IV SOLN
2.0000 g | Freq: Two times a day (BID) | INTRAVENOUS | Status: DC
  Administered 2015-09-29 – 2015-09-30 (×3): 2 g via INTRAVENOUS
  Filled 2015-09-28 (×3): qty 2

## 2015-09-28 NOTE — ED Triage Notes (Addendum)
Pt sent in by home health nurse due to fever of 100.7. Pt c/o of increased lethargy. Family endorses bloody sputum with cough. Denies N/V/D. Family also endorses altered mental status. Pt appears possibly septic. MD notified.

## 2015-09-28 NOTE — ED Notes (Signed)
Primary RN made aware of critical lab value.

## 2015-09-28 NOTE — Telephone Encounter (Signed)
Signed forms faxed to [facility]

## 2015-09-28 NOTE — ED Provider Notes (Signed)
[facility] DEPT Provider Note   CSN: [id number] Arrival date & time: 09/28/15  1705     History   Chief Complaint Chief Complaint  Patient presents with  . Fatigue  . Altered Mental Status    HPI Megan Sims is a 60 y.o. female.  The history is provided by the patient.  Altered Mental Status   This is a recurrent problem. Associated symptoms include weakness.  Patient presents with generalized weakness and confusion. Found to have a fever with her home health nurse. Has been more pale. Reportedly also had black stools. Patient states she just feels bad all over. Has tachycardia. Has had lung cancer is not currently on chemotherapy. 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 Fevers above 100.7 at home. 99.5 here. States she just feels bad all over. Denies dysuria. She has a PEG tube. She has swelling in her arms and legs which is not unusual for her. Patient's family member states she's been much more sleepy yesterday and today.  Past Medical History:  Diagnosis Date  . Anemia 08/03/2015  . Cancer [facility])    malignant neoplasm of unknown origin  . Cerebral aneurysm 2 brain surgeries 96 or 97  . Chronic neck pain 09/24/2014  . Diarrhea   . Emphysema of lung ([place]) 05/05/2015  . Hypertension   . Hyponatremia   . Left knee pain   . Left leg weakness 06/2015  . Lung abnormality   . Lung cancer ([place]) 10/07/2014  . Malignant neoplasm of unknown origin ([place])   . Migraine   . Nicotine dependence 09/24/2014  . Oral thrush 09/24/2014  . Paget disease of bone   . Pleurisy   . Renal insufficiency    Patient states " no kidney problems  . S/P biopsy    of throat per patient.  . Skin ulcer ([place]) 11/18/2014  . 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 HCAP (healthcare-associated pneumonia) 09/08/2015  . Diarrhea 09/02/2015  . Closed fracture of left femur ([place])   . Bleeding   . Acute blood loss anemia   . Fall from slip,  trip, or stumble 08/07/2015  . Left femoral shaft fracture ([place]) 08/07/2015  . 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 S/P gastrostomy ([place]) 08/04/2015  . Deficiency anemia 08/03/2015  . Dehydration 07/02/2015  . Candida esophagitis ([place])   . Anemia due to other cause   . Mucosal abnormality of stomach   . Pill esophagitis due to tetracycline 05/24/2015  . UTI (lower urinary tract infection) 05/24/2015  . Emphysema of lung ([place]) 05/05/2015  . Rash, skin 05/05/2015  . Severe sepsis ([place]) 04/10/2015  . AKI (acute kidney injury) ([place]) 04/10/2015  . Hyponatremia 04/10/2015  . Sepsis due to pneumonia ([place])   . CAP (community acquired pneumonia) 04/09/2015  . Skin ulcer ([place]) 11/18/2014  . Musculoskeletal chest pain 10/25/2014  . Pain in the chest   . Tachycardia   . Anemia due to antineoplastic chemotherapy 10/21/2014  . Cancer of upper lobe of right lung ([place]) 10/07/2014  . Oral thrush 09/24/2014  . Nicotine dependence 09/24/2014  . Chronic neck pain 09/24/2014  . Excessive weight loss 09/24/2014  . 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 Bilateral lung cancer ([place])   . Paget's disease of bone   . Dysphagia 09/05/2014  . Essential hypertension 09/05/2014    Past Surgical History:  Procedure Laterality Date  . CEREBRAL ANEURYSM REPAIR  96  or 97  . CHEST TUBE INSERTION    . COLONOSCOPY     10 years ago in [place]   . ESOPHAGOGASTRODUODENOSCOPY  06/16/2015   Dr. Gala Romney: edentulous cricopharyngeus, esophageal plaques, biopsy consistent with candida  . ESOPHAGOGASTRODUODENOSCOPY N/A 06/16/2015   Procedure: ESOPHAGOGASTRODUODENOSCOPY (EGD);  Surgeon: Daneil Dolin, MD;  [facility];  Service: Endoscopy;  Laterality: N/A;  . ESOPHAGOGASTRODUODENOSCOPY N/A 07/06/2015   Procedure: ESOPHAGOGASTRODUODENOSCOPY (EGD);  Surgeon: Danie Binder, MD;  Location: [facility] END[facility];  Service: Endoscopy;  Laterality: N/A;  . INTRAMEDULLARY (IM) NAIL  INTERTROCHANTERIC Left 08/07/2015   Procedure: INTRAMEDULLARY (IM) NAIL INTERTROCHANTRIC;  Surgeon: Melrose Nakayama, MD;  Location: [facility] ORS;  Service: Orthopedics;  Laterality: Left;  . LARYNGOSCOPY  Jun 22, 2015   [facility], Dr. Erik Obey: normal oropharynx, no lesions, mobile vocal cords with good airway.  . PEG PLACEMENT N/A 07/06/2015   Procedure: PERCUTANEOUS ENDOSCOPIC GASTROSTOMY (PEG) PLACEMENT;  Surgeon: Danie Binder, MD;  Location: [facility];  Service: Endoscopy;  Laterality: N/A;  . TUBAL LIGATION      OB History    Gravida Para Term Preterm AB Living             3   SAB TAB Ectopic Multiple Live Births                   Home Medications    Prior to Admission medications   Medication Sig Start Date End Date Taking? Authorizing Provider  albuterol (PROVENTIL HFA;VENTOLIN HFA) 108 (90 Base) MCG/ACT inhaler Inhale 1-2 puffs into the lungs every 6 (six) hours as needed for wheezing or shortness of breath.   Yes Historical Provider, MD  amLODipine (NORVASC) 10 MG tablet Take 1 tablet (10 mg total) by mouth daily. 08/30/15  Yes Heath Lark, MD  fentaNYL (DURAGESIC - DOSED MCG/HR) 25 MCG/HR patch Place 1 patch (25 mcg total) onto the skin every 3 (three) days. 09/21/15  Yes Heath Lark, MD  furosemide (LASIX) 20 MG tablet Place 20 mg into feeding tube daily.   Yes Historical Provider, MD  hydrocortisone 2.5 % ointment Apply 1 application topically 2 (two) times daily. To face 09/20/15  Yes Historical Provider, MD  metoprolol tartrate (LOPRESSOR) 25 MG tablet Take 0.5 tablets (12.5 mg total) by mouth 2 (two) times daily. 09/10/15  Yes Donne Hazel, MD  morphine (ROXANOL) 20 MG/ML concentrated solution Place 1 mL (20 mg total) into feeding tube every 2 (two) hours as needed for severe pain. 09/21/15  Yes Heath Lark, MD  Nutritional Supplements (FEEDING SUPPLEMENT, JEVITY 1.2 CAL,) LIQD Place 237 mLs into feeding tube every 3 (three) hours. Patient taking differently: Place 391.5 mLs into  feeding tube every 3 (three) hours.  07/08/15  Yes Rosita Fire, MD  nystatin (MYCOSTATIN) 100000 UNIT/ML suspension Take 5 mLs by mouth 4 (four) times daily. Swish and spit for thrush 09/20/15  Yes Historical Provider, MD  predniSONE (DELTASONE) 5 MG tablet Take by mouth once daily as directed: 15 mg daily for 3 days the 10 mg daily. Patient taking differently: Take 10 mg by mouth daily with breakfast.  09/24/15  Yes Heath Lark, MD  triamcinolone ointment (KENALOG) 0.1 % Apply 1 application topically 2 (two) times daily. From neck down. Do not use on face.. 09/20/15  Yes Historical Provider, MD  zolpidem (AMBIEN) 10 MG tablet Take 10 mg by mouth at bedtime as needed for sleep.   Yes Historical Provider, MD  HYDROcodone-acetaminophen (NORCO/VICODIN) 5-325 MG tablet  Take 1-2 tablets by mouth every 6 (six) hours as needed for moderate pain. Patient not taking: Reported on 08/27/2015 08/16/15   Tiffany L Reed, DO  sucralfate (CARAFATE) 1 GM/10ML suspension Take 10 mLs (1 g total) by mouth 4 (four) times daily -  with meals and at bedtime. Patient not taking: Reported on 08/27/2015 08/06/15   Heath Lark, MD    Family History Family History  Problem Relation Age of Onset  . Hypertension    . Diabetes    . Kidney disease    . Cancer Mother     throat ca  . Cancer Maternal Grandmother     thyroid ca  . Colon cancer Neg Hx     Social History Social History  Substance Use Topics  . Smoking status: Current Every Day Smoker    Packs/day: 0.50    Years: 34.00    Types: Cigarettes  . Smokeless tobacco: Never Used     Comment: HOWEVER, will smoke 1 cigarette if "tragic" event (death), or if she needs it for her nerves   . Alcohol use No     Allergies   Hydrocodone-acetaminophen; Demeclocycline; Tetracyclines & related; and Tramadol   Review of Systems Review of Systems  Constitutional: Positive for appetite change, fatigue and fever.  Respiratory: Positive for cough.   Cardiovascular: Negative  for chest pain.  Gastrointestinal: Negative for abdominal pain.  Genitourinary: Negative for dysuria.  Musculoskeletal: Positive for myalgias.  Skin: Positive for rash.  Neurological: Positive for weakness.     Physical Exam Updated Vital Signs BP 115/56   Pulse 110   Temp 99.8 F (37.7 C) (Oral)   Resp 25   Ht '5\' 1"'$  (1.549 m)   Wt 121 lb (54.9 kg)   SpO2 100%   BMI 22.86 kg/m   Physical Exam  Constitutional: She appears well-developed.  HENT:  Head: Atraumatic.  Eyes: Right eye exhibits no discharge. Left eye exhibits no discharge.  Neck: Neck supple.  Cardiovascular: Regular rhythm.   Tachycardia  Pulmonary/Chest: Effort normal.  Abdominal: Soft. There is no tenderness.  Musculoskeletal:  Some edema of left upper extremity greater than right. Some mild edema bilateral lower extremities.  Neurological: She is alert.  Skin: Skin is warm. Capillary refill takes less than 2 seconds. There is pallor.  Chronic skin changes of dermatomyositis.     ED Treatments / Results  Labs (all labs ordered are listed, but only abnormal results are displayed) Labs Reviewed  COMPREHENSIVE METABOLIC PANEL - Abnormal; Notable for the following:       Result Value   Chloride 98 (*)    Glucose, Bld 145 (*)    BUN 82 (*)    Calcium 8.2 (*)    Total Protein 5.3 (*)    Albumin 2.8 (*)    AST 50 (*)    Total Bilirubin 0.2 (*)    All other components within normal limits  CBC WITH DIFFERENTIAL/PLATELET - Abnormal; Notable for the following:    WBC 14.8 (*)    RBC 1.38 (*)    Hemoglobin 4.1 (*)    HCT 13.0 (*)    RDW 19.0 (*)    All other components within normal limits  I-STAT CG4 LACTIC ACID, ED - Abnormal; Notable for the following:    Lactic Acid, Venous 4.83 (*)    All other components within normal limits  CULTURE, BLOOD (ROUTINE X 2)  CULTURE, BLOOD (ROUTINE X 2)  URINE CULTURE  URINALYSIS, ROUTINE W REFLEX MICROSCOPIC (  NOT AT [facility])  I-STAT BETA HCG BLOOD, ED ([facility], [facility],  [facility] ONLY)  POC OCCULT BLOOD, ED  TYPE AND SCREEN  PREPARE RBC (CROSSMATCH)    EKG  EKG Interpretation  Date/Time:  Tuesday September 28 2015 18:00:21 EDT Ventricular Rate:  115 PR Interval:    QRS Duration: 86 QT Interval:  319 QTC Calculation: 442 R Axis:   56 Text Interpretation:  Sinus tachycardia Low voltage, extremity leads Baseline wander in lead(s) V3 No significant change since last tracing Confirmed by Alvino Chapel  MD, Dontario Evetts [phone]) on 09/28/2015 6:17:48 PM       Radiology Dg Chest 2 View  Result Date: 09/28/2015 CLINICAL DATA:  Fever. EXAM: CHEST  2 VIEW COMPARISON:  Radiographs of September 21, 2015. FINDINGS: The heart size and mediastinal contours are within normal limits. No pneumothorax or pleural effusion is noted. Left internal jugular Port-A-Cath is unchanged in position. Left lung is clear. Increased right upper lobe opacity is noted concerning for worsening pneumonia or atelectasis. Atherosclerosis of thoracic aorta is noted. The visualized skeletal structures are unremarkable. IMPRESSION: Aortic atherosclerosis. Mildly increased right upper lobe opacity concerning for worsening atelectasis or pneumonia. Electronically Signed   By: Marijo Conception, M.D.   On: 09/28/2015 17:42    Procedures Procedures (including critical care time)  Medications Ordered in ED Medications  vancomycin (VANCOCIN) IVPB 1000 mg/200 mL premix (not administered)  sodium chloride 0.9 % bolus 1,000 mL (not administered)  pantoprazole (PROTONIX) injection 40 mg (not administered)  0.9 %  sodium chloride infusion (not administered)  ceFEPIme (MAXIPIME) 1 g in dextrose 5 % 50 mL IVPB (1 g Intravenous New Bag/Given 09/28/15 1846)     Initial Impression / Assessment and Plan / ED Course  I have reviewed the triage vital signs and the nursing notes.  Pertinent labs & imaging results that were available during my care of the patient were reviewed by me and considered in my medical decision making  (see chart for details).  Clinical Course    Patient presents with generalized weakness and fever. Also tachycardic. Has had black stools for the last 3 or 4 days. More pale. Patient has borderline blood pressures. She is tachycardic. Lactic acid is elevated however hemoglobin was also found to be 4.4. She is guaiac positive with black stool. Initial 1 L fluid bolus given. She was not given a full 30 mL per kilo less because she was going to be getting blood. Also is chronic edema. Blood pressure has improved. Will admit to hospitalist.  CRITICAL CARE Performed by: Mackie Pai Total critical care time: 30 minutes Critical care time was exclusive of separately billable procedures and treating other patients. Critical care was necessary to treat or prevent imminent or life-threatening deterioration. Critical care was time spent personally by me on the following activities: development of treatment plan with patient and/or surrogate as well as nursing, discussions with consultants, evaluation of patient's response to treatment, examination of patient, obtaining history from patient or surrogate, ordering and performing treatments and interventions, ordering and review of laboratory studies, ordering and review of radiographic studies, pulse oximetry and re-evaluation of patient's condition.   Final Clinical Impressions(s) / ED Diagnoses   Final diagnoses:  HCAP (healthcare-associated pneumonia)  Anemia, unspecified anemia type  Upper GI bleed    New Prescriptions New Prescriptions   No medications on file     Davonna Belling, MD 09/28/15 1919

## 2015-09-28 NOTE — ED Notes (Addendum)
Patient requested to have bp cuff taken off, this tech notified patient that we were monitoring her bp and had it rotating every 35mn. Patient stated that we "were not going to be doing tha"t and said to" turn it off".

## 2015-09-28 NOTE — Progress Notes (Signed)
Pharmacy Antibiotic Note  Megan Sims is a 60 y.o. female with lung cancer presented to the ED from [facility] facility for fever.  CXR with suspicious for PNA.  To start vancomycin for PNA  Plan: - cefepime 1gm IV x1 per MD--> give another 1gm x1 to get 2gm total, then 2gm q12h - vancomycin 1gm IV x1, then 500 mg IV q12h  ____________________  Height: '5\' 1"'$  (154.9 cm) Weight: 121 lb (54.9 kg) IBW/kg (Calculated) : 47.8  Temp (24hrs), Avg:99.5 F (37.5 C), Min:99.5 F (37.5 C), Max:99.5 F (37.5 C)  No results for input(s): WBC, CREATININE, LATICACIDVEN, VANCOTROUGH, VANCOPEAK, VANCORANDOM, GENTTROUGH, GENTPEAK, GENTRANDOM, TOBRATROUGH, TOBRAPEAK, TOBRARND, AMIKACINPEAK, AMIKACINTROU, AMIKACIN in the last 168 hours.  Estimated Creatinine Clearance: 56.4 mL/min (by C-G formula based on SCr of 0.8 mg/dL).    Allergies  Allergen Reactions  . Hydrocodone-Acetaminophen Swelling    Increases secretions and sensation of throat swelling  . Demeclocycline Other (See Comments)    PILLESOPHAGITIS  . Tetracyclines & Related     PILLESOPHAGITIS  . Tramadol     SWELLING     Thank you for allowing pharmacy to be a part of this patient's care.  Lynelle Doctor 09/28/2015 6:23 PM

## 2015-09-28 NOTE — H&P (Addendum)
History and Physical    Megan Sims TXM:[id number] DOB: March 08, 1955 DOA: 09/28/2015  PCP: Jani Gravel, MD  Patient coming from: Home.  Chief Complaint: Weakness.  HPI: Megan Sims is a 60 y.o. female with lung cancer in remission, hypertension, diastolic dysfunction, recently admitted for pneumonia presents to the ER because of fatigue. Patient states over the last few days patient has been getting increasingly weak and noticed some black stools and chest pressure on walking. In the ER patient's hemoglobin is found to be around 4 with stool for occult blood positive. ER physician had discussed with on-call gastroenterologist for [place]. At this time patient is being admitted for blood transfusion and further management of acute GI bleed. Patient denies taking any NSAIDs or Goody powder. Patient is on prednisone for dermatomyositis.   ED Course: Patient was given fluid bolus for mild low normal blood pressure and being tachycardic. Stress dose steroids. Was started on Protonix infusion and 2 units of packed red blood cell transfusion has been ordered.  Review of Systems: As per HPI, rest all negative.   Past Medical History:  Diagnosis Date  . Anemia 08/03/2015  . Cancer [facility])    malignant neoplasm of unknown origin  . Cerebral aneurysm 2 brain surgeries 96 or 97  . Chronic neck pain 09/24/2014  . Diarrhea   . Emphysema of lung ([place]) 05/05/2015  . Hypertension   . Hyponatremia   . Left knee pain   . Left leg weakness 06/2015  . Lung abnormality   . Lung cancer ([place]) 10/07/2014  . Malignant neoplasm of unknown origin ([place])   . Migraine   . Nicotine dependence 09/24/2014  . Oral thrush 09/24/2014  . Paget disease of bone   . Pleurisy   . Renal insufficiency    Patient states " no kidney problems  . S/P biopsy    of throat per patient.  . Skin ulcer ([place]) 11/18/2014  . 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 CEREBRAL ANEURYSM  REPAIR  96 or 97  . CHEST TUBE INSERTION    . COLONOSCOPY     10 years ago in [place]   . ESOPHAGOGASTRODUODENOSCOPY  06/16/2015   Dr. Gala Romney: edentulous cricopharyngeus, esophageal plaques, biopsy consistent with candida  . ESOPHAGOGASTRODUODENOSCOPY N/A 06/16/2015   Procedure: ESOPHAGOGASTRODUODENOSCOPY (EGD);  Surgeon: Daneil Dolin, MD;  Location[facility];  Service: Endoscopy;  Laterality: N/A;  . ESOPHAGOGASTRODUODENOSCOPY N/A 07/06/2015   Procedure: ESOPHAGOGASTRODUODENOSCOPY (EGD);  Surgeon: Danie Binder, MD;  Location: [facility] ENDO SUITE;  Service: Endoscopy;  Laterality: N/A;  . INTRAMEDULLARY (IM) NAIL INTERTROCHANTERIC Left 08/07/2015   Procedure: INTRAMEDULLARY (IM) NAIL INTERTROCHANTRIC;  Surgeon: Melrose Nakayama, MD;  Location: [facility] ORS;  Service: Orthopedics;  Laterality: Left;  . LARYNGOSCOPY  Jun 22, 2015   [facility], Dr. Erik Obey: normal oropharynx, no lesions, mobile vocal cords with good airway.  . PEG PLACEMENT N/A 07/06/2015   Procedure: PERCUTANEOUS ENDOSCOPIC GASTROSTOMY (PEG) PLACEMENT;  Surgeon: Danie Binder, MD;  Location: [facility];  Service: Endoscopy;  Laterality: N/A;  . TUBAL LIGATION       reports that she has been smoking Cigarettes.  She has a 17.00 pack-year smoking history. She has never used smokeless tobacco. She reports that she does not drink alcohol or use drugs.  Allergies  Allergen Reactions  . Hydrocodone-Acetaminophen Swelling    Increases secretions and sensation of throat swelling  . Demeclocycline Other (See Comments)  PILLESOPHAGITIS  . Tetracyclines & Related     PILLESOPHAGITIS  . Tramadol     SWELLING    Family History  Problem Relation Age of Onset  . Hypertension    . Diabetes    . Kidney disease    . Cancer Mother     throat ca  . Cancer Maternal Grandmother     thyroid ca  . Colon cancer Neg Hx     Prior to Admission medications   Medication Sig Start Date End Date Taking? Authorizing Provider  albuterol  (PROVENTIL HFA;VENTOLIN HFA) 108 (90 Base) MCG/ACT inhaler Inhale 1-2 puffs into the lungs every 6 (six) hours as needed for wheezing or shortness of breath.   Yes Historical Provider, MD  amLODipine (NORVASC) 10 MG tablet Take 1 tablet (10 mg total) by mouth daily. 08/30/15  Yes Heath Lark, MD  fentaNYL (DURAGESIC - DOSED MCG/HR) 25 MCG/HR patch Place 1 patch (25 mcg total) onto the skin every 3 (three) days. 09/21/15  Yes Heath Lark, MD  furosemide (LASIX) 20 MG tablet Place 20 mg into feeding tube daily.   Yes Historical Provider, MD  hydrocortisone 2.5 % ointment Apply 1 application topically 2 (two) times daily. To face 09/20/15  Yes Historical Provider, MD  metoprolol tartrate (LOPRESSOR) 25 MG tablet Take 0.5 tablets (12.5 mg total) by mouth 2 (two) times daily. 09/10/15  Yes Donne Hazel, MD  morphine (ROXANOL) 20 MG/ML concentrated solution Place 1 mL (20 mg total) into feeding tube every 2 (two) hours as needed for severe pain. 09/21/15  Yes Heath Lark, MD  Nutritional Supplements (FEEDING SUPPLEMENT, JEVITY 1.2 CAL,) LIQD Place 237 mLs into feeding tube every 3 (three) hours. Patient taking differently: Place 391.5 mLs into feeding tube every 3 (three) hours.  07/08/15  Yes Rosita Fire, MD  nystatin (MYCOSTATIN) 100000 UNIT/ML suspension Take 5 mLs by mouth 4 (four) times daily. Swish and spit for thrush 09/20/15  Yes Historical Provider, MD  predniSONE (DELTASONE) 5 MG tablet Take by mouth once daily as directed: 15 mg daily for 3 days the 10 mg daily. Patient taking differently: Take 10 mg by mouth daily with breakfast.  09/24/15  Yes Heath Lark, MD  triamcinolone ointment (KENALOG) 0.1 % Apply 1 application topically 2 (two) times daily. From neck down. Do not use on face.. 09/20/15  Yes Historical Provider, MD  zolpidem (AMBIEN) 10 MG tablet Take 10 mg by mouth at bedtime as needed for sleep.   Yes Historical Provider, MD  HYDROcodone-acetaminophen (NORCO/VICODIN) 5-325 MG tablet Take 1-2  tablets by mouth every 6 (six) hours as needed for moderate pain. Patient not taking: Reported on 08/27/2015 08/16/15   Tiffany L Reed, DO  sucralfate (CARAFATE) 1 GM/10ML suspension Take 10 mLs (1 g total) by mouth 4 (four) times daily -  with meals and at bedtime. Patient not taking: Reported on 08/27/2015 08/06/15   Heath Lark, MD    Physical Exam: Vitals:   09/28/15 2057 09/28/15 2100 09/28/15 2130 09/28/15 2145  BP:   (!) 125/53 (!) 112/36  Pulse:  (!) 108 (!) 114 (!) 101  Resp:  (!) 22 (!) 22 16  Temp: 99.6 F (37.6 C)  99.1 F (37.3 C) 99.2 F (37.3 C)  TempSrc: Oral  Oral Oral  SpO2:  100% 100% (!) 89%  Weight: 126 lb 12.2 oz (57.5 kg)     Height: '5\' 1"'$  (1.549 m)         Constitutional: Not in distress. Vitals:  09/28/15 2057 09/28/15 2100 09/28/15 2130 09/28/15 2145  BP:   (!) 125/53 (!) 112/36  Pulse:  (!) 108 (!) 114 (!) 101  Resp:  (!) 22 (!) 22 16  Temp: 99.6 F (37.6 C)  99.1 F (37.3 C) 99.2 F (37.3 C)  TempSrc: Oral  Oral Oral  SpO2:  100% 100% (!) 89%  Weight: 126 lb 12.2 oz (57.5 kg)     Height: '5\' 1"'$  (1.549 m)      Eyes: Anicteric mild pallor. ENMT: No discharge from the ears eyes nose or mouth. Neck: No mass felt. No JVD appreciated. Respiratory: No rhonchi or crepitations. Cardiovascular: S1-S2 heard. Abdomen: Soft nontender bowel sounds present. No guarding or rigidity. Musculoskeletal: Bilateral lower extremity edema. Skin: No rash. Neurologic: Alert awake oriented to time place and person. Moves all extremities. Psychiatric: Appears normal.   Labs on Admission: I have personally reviewed following labs and imaging studies  CBC:  Recent Labs Lab 09/28/15 1835  WBC 14.8*  NEUTROABS 13.2*  HGB 4.1*  HCT 13.0*  MCV 94.2  PLT 401   Basic Metabolic Panel:  Recent Labs Lab 09/28/15 1835  NA 135  K 4.5  CL 98*  CO2 26  GLUCOSE 145*  BUN 82*  CREATININE 0.97  CALCIUM 8.2*   GFR: Estimated Creatinine Clearance: 50.3 mL/min (by  C-G formula based on SCr of 0.97 mg/dL). Liver Function Tests:  Recent Labs Lab 09/28/15 1835  AST 50*  ALT 14  ALKPHOS 49  BILITOT 0.2*  PROT 5.3*  ALBUMIN 2.8*   No results for input(s): LIPASE, AMYLASE in the last 168 hours. No results for input(s): AMMONIA in the last 168 hours. Coagulation Profile:  Recent Labs Lab 09/28/15 1831  INR 1.07   Cardiac Enzymes: No results for input(s): CKTOTAL, CKMB, CKMBINDEX, TROPONINI in the last 168 hours. BNP (last 3 results) No results for input(s): PROBNP in the last 8760 hours. HbA1C: No results for input(s): HGBA1C in the last 72 hours. CBG: No results for input(s): GLUCAP in the last 168 hours. Lipid Profile: No results for input(s): CHOL, HDL, LDLCALC, TRIG, CHOLHDL, LDLDIRECT in the last 72 hours. Thyroid Function Tests: No results for input(s): TSH, T4TOTAL, FREET4, T3FREE, THYROIDAB in the last 72 hours. Anemia Panel: No results for input(s): VITAMINB12, FOLATE, FERRITIN, TIBC, IRON, RETICCTPCT in the last 72 hours. Urine analysis:    Component Value Date/Time   COLORURINE YELLOW 09/28/2015 1959   APPEARANCEUR CLEAR 09/28/2015 1959   LABSPEC 1.014 09/28/2015 1959   PHURINE 5.5 09/28/2015 1959   GLUCOSEU NEGATIVE 09/28/2015 1959   HGBUR SMALL (A) 09/28/2015 1959   BILIRUBINUR NEGATIVE 09/28/2015 1959   KETONESUR NEGATIVE 09/28/2015 1959   PROTEINUR NEGATIVE 09/28/2015 1959   UROBILINOGEN 0.2 10/25/2014 0021   NITRITE NEGATIVE 09/28/2015 1959   LEUKOCYTESUR NEGATIVE 09/28/2015 1959   Sepsis Labs: '@LABRCNTIP'$ (procalcitonin:4,lacticidven:4) )No results found for this or any previous visit (from the past 240 hour(s)).   Radiological Exams on Admission: Dg Chest 2 View  Result Date: 09/28/2015 CLINICAL DATA:  Fever. EXAM: CHEST  2 VIEW COMPARISON:  Radiographs of September 21, 2015. FINDINGS: The heart size and mediastinal contours are within normal limits. No pneumothorax or pleural effusion is noted. Left internal  jugular Port-A-Cath is unchanged in position. Left lung is clear. Increased right upper lobe opacity is noted concerning for worsening pneumonia or atelectasis. Atherosclerosis of thoracic aorta is noted. The visualized skeletal structures are unremarkable. IMPRESSION: Aortic atherosclerosis. Mildly increased right upper lobe opacity concerning for  worsening atelectasis or pneumonia. Electronically Signed   By: Marijo Conception, M.D.   On: 09/28/2015 17:42    EKG: Independently reviewed. Sinus tachycardia.  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 Acute GI bleed - likely upper GI. On-call gastroenterologist for [facility] was notified by ER physician. Patient will be kept in in by mouth in anticipation of EGD. Continue Protonix infusion. 2 units of packed red blood cell transfusion has been ordered. Repeat CBC after transfusion. Recheck lactate after transfusion. Since patient also had exertional chest discomfort probably from anemia check troponin. 2. Acute blood loss anemia - see #1. Follow CBC after transfusion. 3. Possible pneumonia with chest x-ray showing worsening infiltrate in the right upper lobe - patient is placed on empiric antibiotics for healthcare associated pneumonia. Note that patient has lung cancer and will need follow-up with radiological imaging until complete resolution. 4. Hypertension - since patient's blood pressure initially in the low-normal will hold off antihypertensives for now. 5. Diastolic dysfunction last EF measured and October 2016 was 55% with grade 1 diastolic dysfunction - patient does have bilateral lower extremity edema. At this time we will hold off any Lasix since patient has elevated lactate and blood pressure is being in the low-normal. 6. History of dermatomyositis on prednisone -  presently on stress dose steroids. 7. Bilateral lung cancer in remission. Being followed by oncologist. 8. Gastrostomy tube feeding due to dysphagia.   DVT prophylaxis: SCDs. Code Status: Full code.  Family Communication: Discussed with patient.  Disposition Plan: Home.  Consults called:  gastroenterologist was notified by ER physician.  Admission status: Inpatient. Stepdown.    Rise Patience MD [facility] Pager [phone].  If 7PM-7AM, please contact night-coverage [email or web address] Password [email or web address]  09/28/2015, 9:50 PM

## 2015-09-29 ENCOUNTER — Telehealth (HOSPITAL_COMMUNITY): Payer: Self-pay

## 2015-09-29 ENCOUNTER — Inpatient Hospital Stay (HOSPITAL_COMMUNITY): Payer: Medicaid Other | Admitting: Certified Registered"

## 2015-09-29 ENCOUNTER — Encounter (HOSPITAL_COMMUNITY)
Admission: EM | Disposition: A | Discharge status: Home / Self Care | Payer: Self-pay | Source: Home / Self Care | Attending: Internal Medicine

## 2015-09-29 ENCOUNTER — Encounter (HOSPITAL_COMMUNITY): Payer: Self-pay | Admitting: *Deleted

## 2015-09-29 DIAGNOSIS — L8990 Pressure ulcer of unspecified site, unspecified stage: Secondary | ICD-10-CM | POA: Insufficient documentation

## 2015-09-29 DIAGNOSIS — I10 Essential (primary) hypertension: Secondary | ICD-10-CM

## 2015-09-29 DIAGNOSIS — K921 Melena: Secondary | ICD-10-CM

## 2015-09-29 DIAGNOSIS — K264 Chronic or unspecified duodenal ulcer with hemorrhage: Secondary | ICD-10-CM

## 2015-09-29 DIAGNOSIS — M360 Dermato(poly)myositis in neoplastic disease: Secondary | ICD-10-CM

## 2015-09-29 DIAGNOSIS — D62 Acute posthemorrhagic anemia: Secondary | ICD-10-CM

## 2015-09-29 DIAGNOSIS — K222 Esophageal obstruction: Secondary | ICD-10-CM

## 2015-09-29 HISTORY — PX: ESOPHAGOGASTRODUODENOSCOPY: SHX5428

## 2015-09-29 LAB — LACTIC ACID, PLASMA: Lactic Acid, Venous: 1.2 mmol/L (ref 0.5–1.9)

## 2015-09-29 LAB — COMPREHENSIVE METABOLIC PANEL
ALBUMIN: 2.4 g/dL — AB (ref 3.5–5.0)
ALK PHOS: 42 U/L (ref 38–126)
ALT: 14 U/L (ref 14–54)
AST: 42 U/L — AB (ref 15–41)
Anion gap: 6 (ref 5–15)
BILIRUBIN TOTAL: 0.5 mg/dL (ref 0.3–1.2)
BUN: 67 mg/dL — AB (ref 6–20)
CALCIUM: 7.8 mg/dL — AB (ref 8.9–10.3)
CO2: 28 mmol/L (ref 22–32)
CREATININE: 0.86 mg/dL (ref 0.44–1.00)
Chloride: 103 mmol/L (ref 101–111)
GFR calc Af Amer: >60 mL/min (ref >60)
GFR calc non Af Amer: >60 mL/min (ref >60)
GLUCOSE: 147 mg/dL — AB (ref 65–99)
Potassium: 4.1 mmol/L (ref 3.5–5.1)
SODIUM: 137 mmol/L (ref 135–145)
TOTAL PROTEIN: 4.7 g/dL — AB (ref 6.5–8.1)

## 2015-09-29 LAB — CBC
HEMATOCRIT: 21.2 % — AB (ref 36.0–46.0)
HEMOGLOBIN: 7.2 g/dL — AB (ref 12.0–15.0)
MCH: 30.5 pg (ref 26.0–34.0)
MCHC: 34 g/dL (ref 30.0–36.0)
MCV: 89.8 fL (ref 78.0–100.0)
Platelets: 139 10*3/uL — ABNORMAL LOW (ref 150–400)
RBC: 2.36 MIL/uL — AB (ref 3.87–5.11)
RDW: 17.2 % — ABNORMAL HIGH (ref 11.5–15.5)
WBC: 11.8 10*3/uL — AB (ref 4.0–10.5)

## 2015-09-29 LAB — GLUCOSE, CAPILLARY
GLUCOSE-CAPILLARY: 122 mg/dL — AB (ref 65–99)
GLUCOSE-CAPILLARY: 134 mg/dL — AB (ref 65–99)
GLUCOSE-CAPILLARY: 149 mg/dL — AB (ref 65–99)
GLUCOSE-CAPILLARY: 163 mg/dL — AB (ref 65–99)
Glucose-Capillary: 102 mg/dL — ABNORMAL HIGH (ref 65–99)
Glucose-Capillary: 142 mg/dL — ABNORMAL HIGH (ref 65–99)

## 2015-09-29 LAB — TROPONIN I: Troponin I: 0.04 ng/mL (ref <0.03)

## 2015-09-29 SURGERY — EGD (ESOPHAGOGASTRODUODENOSCOPY)
Anesthesia: Monitor Anesthesia Care

## 2015-09-29 MED ORDER — PROPOFOL 10 MG/ML IV BOLUS
INTRAVENOUS | Status: DC | PRN
  Administered 2015-09-29 (×6): 20 mg via INTRAVENOUS

## 2015-09-29 MED ORDER — PROPOFOL 10 MG/ML IV BOLUS
INTRAVENOUS | Status: AC
  Filled 2015-09-29: qty 20

## 2015-09-29 MED ORDER — PANTOPRAZOLE SODIUM 40 MG PO PACK
40.0000 mg | PACK | Freq: Two times a day (BID) | ORAL | Status: DC
  Administered 2015-09-29 – 2015-09-30 (×2): 40 mg
  Filled 2015-09-29 (×2): qty 20

## 2015-09-29 MED ORDER — PHENYLEPHRINE HCL 10 MG/ML IJ SOLN
INTRAMUSCULAR | Status: DC | PRN
  Administered 2015-09-29 (×4): 80 ug via INTRAVENOUS

## 2015-09-29 MED ORDER — JEVITY 1.2 CAL PO LIQD
1000.0000 mL | ORAL | Status: DC
  Filled 2015-09-29 (×3): qty 1000

## 2015-09-29 MED ORDER — PRO-STAT SUGAR FREE PO LIQD
30.0000 mL | Freq: Every day | ORAL | Status: DC
  Administered 2015-09-29: 30 mL
  Administered 2015-09-30: 11:00:00
  Filled 2015-09-29 (×2): qty 30

## 2015-09-29 MED ORDER — FREE WATER
100.0000 mL | Freq: Four times a day (QID) | Status: DC
  Administered 2015-09-29 – 2015-09-30 (×4): 100 mL

## 2015-09-29 MED ORDER — JEVITY 1.5 CAL/FIBER PO LIQD
1000.0000 mL | ORAL | Status: DC
  Administered 2015-09-29: 1000 mL
  Filled 2015-09-29 (×2): qty 1000

## 2015-09-29 MED ORDER — CHLORHEXIDINE GLUCONATE 0.12 % MT SOLN
15.0000 mL | Freq: Two times a day (BID) | OROMUCOSAL | Status: DC
  Administered 2015-09-29 – 2015-09-30 (×3): 15 mL via OROMUCOSAL
  Filled 2015-09-29 (×2): qty 15

## 2015-09-29 MED ORDER — ORAL CARE MOUTH RINSE
15.0000 mL | Freq: Two times a day (BID) | OROMUCOSAL | Status: DC
  Administered 2015-09-29: 15 mL via OROMUCOSAL

## 2015-09-29 NOTE — Op Note (Signed)
[facility] Patient Name: Megan Sims Procedure Date: 09/29/2015 MRN: [id number] Attending MD: Docia Chuck. Henrene Pastor , MD Date of Birth: [birth year] CSN: [id number] Age: 60 Admit Type: Inpatient Procedure:                Upper GI endoscopy, with biopsy Indications:              [place] Providers:                Docia Chuck. Henrene Pastor, MD, Elspeth Cho Tech.,                            Technician, Glenis Smoker, CRNA, Elmer Ramp. Tilden Dome,                            RN Referring MD:              Medicines:                Monitored Anesthesia Care Complications:            No immediate complications. Estimated Blood Loss:     Estimated blood loss: none. Procedure:                Pre-Anesthesia Assessment:                           - Prior to the procedure, a History and Physical                            was performed, and patient medications and                            allergies were reviewed. The patient's tolerance of                            previous anesthesia was also reviewed. The risks                            and benefits of the procedure and the sedation                            options and risks were discussed with the patient.                            All questions were answered, and informed consent                            was obtained. Prior Anticoagulants: The patient has                            taken no previous anticoagulant or antiplatelet                            agents. ASA Grade Assessment: III - A patient with  severe systemic disease. After reviewing the risks                            and benefits, the patient was deemed in                            satisfactory condition to undergo the procedure.                           After obtaining informed consent, the endoscope was                            passed under direct vision. Throughout the                            procedure, the patient's blood pressure, pulse, and                           oxygen saturations were monitored continuously. The                            [id number] ([id number]) scope was introduced through the                            mouth, and advanced to the second part of duodenum.                            The upper GI endoscopy was accomplished without                            difficulty. The patient tolerated the procedure                            well. Scope In: Scope Out: Findings:      LA Grade A (one or more mucosal breaks less than 5 mm, not extending       between tops of 2 mucosal folds) esophagitis was found.      One mild benign-appearing, intrinsic stenosis was found. This measured       1.6 cm (inner diameter).      There was evidence of an intact gastrostomy with a patent G-tube present       in the gastric body.      The exam of the stomach was otherwise normal.      One non-bleeding cratered duodenal ulcer with no stigmata of bleeding       was found in the duodenal bulb. The lesion was 12 mm in largest       dimension. Biopsies were taken with a cold forceps for Helicobacter       pylori testing using CLOtest.      The exam of the duodenum was otherwise normal. Impression:               1. Acute duodenal ulcer without stigmata or active                            bleeding  2. Mild esophagitis and large caliber distal                            esophageal stricture                           3. Otherwise unremarkable exam post gastrostomy                            tube placement. Moderate Sedation:      none Recommendation:           1. Pantoprazole 40 mg twice daily for 4 weeks then                            once daily indefinitely                           2. Treat for Helicobacter pylori if CLO test                            positive. She can follow-up with her local GI in                            this regard if needed                           3. Avoid NSAIDs                            4. Return to hospital ward. Transfuse to desired                            hemoglobin. If stable, could return home tomorrow                            needs to be compliant with PPI therapy                           GI will sign off. Discussed with patient's son and                            report provided.                           - Resume previous diet.                           - Continue present medications. Procedure Code(s):        --- Professional ---                           [phone], Esophagogastroduodenoscopy, flexible,                            transoral; with biopsy, single or multiple Diagnosis Code(s):        --- Professional ---  K21.0, Gastro-esophageal reflux disease with                            esophagitis                           K22.2, Esophageal obstruction                           Z93.1, Gastrostomy status                           K26.9, Duodenal ulcer, unspecified as acute or                            chronic, without hemorrhage or perforation                           K92.1, Melena (includes Hematochezia) CPT copyright 2016 American Medical Association. All rights reserved. The codes documented in this report are preliminary and upon coder review may  be revised to meet current compliance requirements. Docia Chuck. Henrene Pastor, MD 09/29/2015 12:51:17 PM This report has been signed electronically. Number of Addenda: 0

## 2015-09-29 NOTE — Care Management Note (Signed)
Case Management Note  Patient Details  Name: Megan Sims MRN: [id number] Date of Birth: Jan 23, 1956  Subjective/Objective:      [place] with hypotension              Action/Plan: home  Expected Discharge Date:      [id number]            Expected Discharge Plan:  Home/Self Care  In-House Referral:  NA  Discharge planning Services  NA  Post Acute Care Choice:  NA Choice offered to:     DME Arranged:  N/A DME Agency:  NA  HH Arranged:  NA HH Agency:  NA  Status of Service:  In process, will continue to follow  If discussed at Long Length of Stay Meetings, dates discussed:    Additional Comments:Date:  September 29, 2015 Chart reviewed for concurrent status and case management needs. Will continue to follow the patient for status change: Discharge Planning: following for needs Expected discharge date: [id number] Velva Harman, BSN, [place], [place] Leeroy Cha, RN 09/29/2015, 10:25 AM

## 2015-09-29 NOTE — Progress Notes (Signed)
CRITICAL VALUE ALERT  Critical value received: troponin= 0.04  Date of notification:  09/29/15  Time of notification:  0825   Critical value read back:Yes.    Nurse who received alert:  Nadina Fomby, RN  MD notified (1st page):  Dr.Vann  Time of first page:  0830  Responding MD:  Dr. Eliseo Squires  Time MD responded:  Face to face conversation with Dr. Eliseo Squires at [phone]. No new order received.

## 2015-09-29 NOTE — Progress Notes (Signed)
PROGRESS NOTE    Megan Sims  LYY:[id number] DOB: [birth year] DOA: 09/28/2015 PCP: Jani Gravel, MD   Outpatient Specialists:     Brief Narrative:  Megan Sims is a 60 y.o. female with lung cancer in remission, hypertension, diastolic dysfunction, recently admitted for pneumonia presents to the ER because of fatigue. Patient states over the last few days patient has been getting increasingly weak and noticed some black stools and chest pressure on walking. In the ER patient's hemoglobin is found to be around 4 with stool for occult blood positive. ER physician had discussed with on-call gastroenterologist for [place]. At this time patient is being admitted for blood transfusion and further management of acute GI bleed. Patient denies taking any NSAIDs or Goody powder. Patient is on prednisone for dermatomyositis.    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 Pantoprazole 40 mg twice daily for 4 weeks then  once daily indefinitely 2. Treat for Helicobacter pylori if CLO test  positive. She can follow-up with her local GI in this regard if needed 3. Avoid NSAIDs  4. Return to hospital ward. Transfuse to desired hemoglobin. If stable, could return home tomorrow needs to be compliant with PPI therapy  Acute blood loss anemia  -H/H improved -check Fe in AM as well as CBC  Possible pneumonia with chest x-ray showing worsening infiltrate in the right upper lobe - patient is placed on empiric antibiotics for healthcare associated pneumonia.  -will need follow-up with radiological imaging until complete resolution.  Hypertension -  since patient's blood pressure initially in the low-normal will hold off antihypertensives for now.  Diastolic dysfunction last EF measured and October 2016 was 55% with grade 1 diastolic dysfunction   History of dermatomyositis on prednisone - presently on stress dose steroids.  Bilateral lung cancer in remission. Being followed by oncologist.  Gastrostomy tube feeding due to dysphagia.  -resume home tube feeds   DVT prophylaxis:  SCD's  Code Status: Full Code   Family Communication: patient  Disposition Plan:     Consultants:   GI  Procedures:   EGD     Subjective: Cough up mucous-- not new  Objective: Vitals:   09/29/15 0800 09/29/15 0830 09/29/15 1141 09/29/15 1256  BP:  (!) 108/40 (!) 128/50 (!) 99/36  Pulse:  88  81  Resp:  17 (!) 28 15  Temp: 98.4 F (36.9 C)   98.1 F (36.7 C)  TempSrc: Oral   Oral  SpO2:  98% 94% 100%  Weight:      Height:        Intake/Output Summary (Last 24 hours) at 09/29/15 1259 Last data filed at 09/29/15 1249  Gross per 24 hour  Intake          1068.25 ml  Output              775 ml  Net           293.25 ml   Filed Weights   09/28/15 1716 09/28/15 2057 09/28/15 2130  Weight: 54.9 kg (121 lb) 57.5 kg (126 lb 12.2 oz) 57.5 kg (126 lb 12.2 oz)  Examination:  General exam: chronically ill appearing Respiratory system: Clear to auscultation. Respiratory effort normal. Cardiovascular system: S1 & S2 heard, RRR. No JVD, murmurs, rubs, gallops or clicks. No pedal edema. Gastrointestinal system: Abdomen is nondistended, soft and nontender. No organomegaly or masses felt. Normal bowel sounds heard. Central nervous system: Alert and oriented. No focal neurological deficits. Extremities: Symmetric 5 x 5 power. Skin: No rashes, lesions or ulcers Psychiatry: Judgement and insight appear normal. Mood & affect appropriate.     Data Reviewed: I have personally reviewed following labs and imaging  studies  CBC:  Recent Labs Lab 09/28/15 1835 09/29/15 0415  WBC 14.8* 11.8*  NEUTROABS 13.2*  --   HGB 4.1* 7.2*  HCT 13.0* 21.2*  MCV 94.2 89.8  PLT 197 952*   Basic Metabolic Panel:  Recent Labs Lab 09/28/15 1835 09/29/15 0415  NA 135 137  K 4.5 4.1  CL 98* 103  CO2 26 28  GLUCOSE 145* 147*  BUN 82* 67*  CREATININE 0.97 0.86  CALCIUM 8.2* 7.8*   GFR: Estimated Creatinine Clearance: 56.8 mL/min (by C-G formula based on SCr of 0.86 mg/dL). Liver Function Tests:  Recent Labs Lab 09/28/15 1835 09/29/15 0415  AST 50* 42*  ALT 14 14  ALKPHOS 49 42  BILITOT 0.2* 0.5  PROT 5.3* 4.7*  ALBUMIN 2.8* 2.4*   No results for input(s): LIPASE, AMYLASE in the last 168 hours. No results for input(s): AMMONIA in the last 168 hours. Coagulation Profile:  Recent Labs Lab 09/28/15 1831  INR 1.07   Cardiac Enzymes:  Recent Labs Lab 09/29/15 0700  TROPONINI 0.04*   BNP (last 3 results) No results for input(s): PROBNP in the last 8760 hours. HbA1C: No results for input(s): HGBA1C in the last 72 hours. CBG:  Recent Labs Lab 09/29/15 0001 09/29/15 0734 09/29/15 1118  GLUCAP 142* 134* 122*   Lipid Profile: No results for input(s): CHOL, HDL, LDLCALC, TRIG, CHOLHDL, LDLDIRECT in the last 72 hours. Thyroid Function Tests: No results for input(s): TSH, T4TOTAL, FREET4, T3FREE, THYROIDAB in the last 72 hours. Anemia Panel: No results for input(s): VITAMINB12, FOLATE, FERRITIN, TIBC, IRON, RETICCTPCT in the last 72 hours. Urine analysis:    Component Value Date/Time   COLORURINE YELLOW 09/28/2015 1959   APPEARANCEUR CLEAR 09/28/2015 1959   LABSPEC 1.014 09/28/2015 1959   PHURINE 5.5 09/28/2015 1959   GLUCOSEU NEGATIVE 09/28/2015 1959   HGBUR SMALL (A) 09/28/2015 1959   BILIRUBINUR NEGATIVE 09/28/2015 1959   KETONESUR NEGATIVE 09/28/2015 1959   PROTEINUR NEGATIVE 09/28/2015 1959   UROBILINOGEN 0.2 10/25/2014 0021   NITRITE NEGATIVE 09/28/2015 1959    LEUKOCYTESUR NEGATIVE 09/28/2015 1959    ) Recent Results (from the past 240 hour(s))  MRSA PCR Screening     Status: None   Collection Time: 09/28/15  9:08 PM  Result Value Ref Range Status   MRSA by PCR NEGATIVE NEGATIVE Final    Comment:        The GeneXpert MRSA Assay (FDA approved for NASAL specimens only), is one component of a comprehensive MRSA colonization surveillance program. It is not intended to diagnose MRSA infection nor to guide or monitor treatment for MRSA infections.       Anti-infectives    Start     Dose/Rate Route Frequency Ordered Stop   09/29/15 1200  [MAR Hold]  vancomycin (VANCOCIN) 500 mg in sodium chloride 0.9 % 100 mL IVPB     (MAR Hold since 09/29/15 1137)   500 mg 100  mL/hr over 60 Minutes Intravenous Every 12 hours 09/28/15 1917     09/29/15 0800  [MAR Hold]  ceFEPIme (MAXIPIME) 2 g in dextrose 5 % 50 mL IVPB     (MAR Hold since 09/29/15 1137)   2 g 100 mL/hr over 30 Minutes Intravenous Every 12 hours 09/28/15 1917     09/28/15 2200  ceFEPIme (MAXIPIME) 1 g in dextrose 5 % 50 mL IVPB  Status:  Discontinued     1 g 100 mL/hr over 30 Minutes Intravenous Every 8 hours 09/28/15 2149 09/28/15 2155   09/28/15 1930  ceFEPIme (MAXIPIME) 1 g in dextrose 5 % 50 mL IVPB     1 g 100 mL/hr over 30 Minutes Intravenous  Once 09/28/15 1917 09/28/15 2252   09/28/15 1830  vancomycin (VANCOCIN) IVPB 1000 mg/200 mL premix     1,000 mg 200 mL/hr over 60 Minutes Intravenous  Once 09/28/15 1828 09/28/15 2020   09/28/15 1815  ceFEPIme (MAXIPIME) 1 g in dextrose 5 % 50 mL IVPB     1 g 100 mL/hr over 30 Minutes Intravenous  Once 09/28/15 1803 09/28/15 1920       Radiology Studies: Dg Chest 2 View  Result Date: 09/28/2015 CLINICAL DATA:  Fever. EXAM: CHEST  2 VIEW COMPARISON:  Radiographs of September 21, 2015. FINDINGS: The heart size and mediastinal contours are within normal limits. No pneumothorax or pleural effusion is noted. Left internal jugular Port-A-Cath  is unchanged in position. Left lung is clear. Increased right upper lobe opacity is noted concerning for worsening pneumonia or atelectasis. Atherosclerosis of thoracic aorta is noted. The visualized skeletal structures are unremarkable. IMPRESSION: Aortic atherosclerosis. Mildly increased right upper lobe opacity concerning for worsening atelectasis or pneumonia. Electronically Signed   By: Marijo Conception, M.D.   On: 09/28/2015 17:42        Scheduled Meds: . [MAR Hold] ceFEPime (MAXIPIME) IV  2 g Intravenous Q12H  . [MAR Hold] fentaNYL  25 mcg Transdermal Q72H  . [MAR Hold] hydrocortisone sod succinate (SOLU-CORTEF) inj  50 mg Intravenous Q8H  . [MAR Hold] mouth rinse  15 mL Mouth Rinse BID  . [MAR Hold] pantoprazole  40 mg Intravenous Once  . [MAR Hold] triamcinolone ointment  1 application Topical BID  . [MAR Hold] vancomycin  500 mg Intravenous Q12H   Continuous Infusions: . sodium chloride 10 mL/hr at 09/28/15 2200  . pantoprozole (PROTONIX) infusion 8 mg/hr (09/29/15 0749)     LOS: 1 day    Time spent: 85 min    [place], DO [facility] Pager [phone]  If 7PM-7AM, please contact night-coverage [email or web address] Password [email or web address] 09/29/2015, 12:59 PM

## 2015-09-29 NOTE — Anesthesia Postprocedure Evaluation (Signed)
Anesthesia Post Note  Patient: Mittie Bodo  Procedure(s) Performed: Procedure(s) (LRB): ESOPHAGOGASTRODUODENOSCOPY (EGD) (N/A)  Patient location during evaluation: PACU Anesthesia Type: MAC Level of consciousness: awake and alert Pain management: pain level controlled Vital Signs Assessment: post-procedure vital signs reviewed and stable Respiratory status: spontaneous breathing, nonlabored ventilation, respiratory function stable and patient connected to nasal cannula oxygen Cardiovascular status: stable and blood pressure returned to baseline Anesthetic complications: no    Last Vitals:  Vitals:   09/29/15 1300 09/29/15 1310  BP: (!) 93/41 (!) 113/52  Pulse: 78 87  Resp: (!) 22 14  Temp:      Last Pain:  Vitals:   09/29/15 1256  TempSrc: Oral  PainSc:                  Adriyanna Christians J

## 2015-09-29 NOTE — Progress Notes (Signed)
Initial Nutrition Assessment  DOCUMENTATION CODES:   Not applicable  INTERVENTION:  - Will order Jevity 1.5 @ 45 mL/hr with 30 mL Prostat once/day and 100 mL free water QID. This regimen will provide 1720 kcal, 84 grams of protein, and 1221 mL free water.  - RD will follow-up 9/8.  NUTRITION DIAGNOSIS:   Inadequate oral intake related to inability to eat, dysphagia as evidenced by NPO status.  GOAL:   Patient will meet greater than or equal to 90% of their needs  MONITOR:   TF tolerance, Weight trends, Labs, Skin, I & O's  REASON FOR ASSESSMENT:   Malnutrition Screening Tool, Consult Enteral/tube feeding initiation and management  ASSESSMENT:   60 y.o. female with lung cancer in remission, hypertension, diastolic dysfunction, recently admitted for pneumonia presents to the ER because of fatigue. Patient states over the last few days patient has been getting increasingly weak and noticed some black stools and chest pressure on walking. In the ER patient's hemoglobin is found to be around 4 with stool for occult blood positive. ER physician had discussed with on-call gastroenterologist for [place]. At this time patient is being admitted for blood transfusion and further management of acute GI bleed. Patient denies taking any NSAIDs or Goody powder. Patient is on prednisone for dermatomyositis.   Pt seen for MST and TF consult. BMI indicates normal weight. Pt out of room for in EGD for several hours and returned about 20 minutes ago. GI note from earlier today, before EGD, stated mild gastritis and moderate duodenitis.   Pt still drowsy from procedure and all information obtained from son, who is at bedside. At home, pt was receiving 1.5 cans of Jevity 1.2 every 4 hours QID. This regimen (6 cans total) provides 1710 kcal, 79 grams of protein, and 1146 mL free water/day. Son reports that pt sometimes does 2 cans at a time or that timing of feeding might be spaced further apart if pt is  feeling full.   Physical assessment not performed at this time with respect to pt's comfort following procedure; will perform at follow-up. Physical assessment performed by RD on 09/09/15 showed no muscle or fat wasting at that time. Per chart review, pt's weight has been mainly stable/slight weight gain since July.   Son reports that pt does small sips of water and ice chips but no other PO. Will order TF regimen as outlined above and follow-up 9/8. Will order continuous TF at this time d/t gastritis and duodenitis and transition back to bolus feeds per home regimen when medically feasible. Will utilize Jevity 1.5 as to keep rate lower than it would be with Jevity 1.2.   Medications reviewed; 50 mg IV Solu-Cortef TID, MEDLINE mouth rinse BID, PRN Zofran, 80 mg Protonix/day.  Labs reviewed; CBGs: 122-142 mg/dL, BUN: 67 mg/dL, Ca: 7.8 mg/dL, AST elevated but trending down.    Diet Order:  Diet NPO time specified Except for: Sips with Meds  Skin:  Wound (see comment) (Stage 2 R buttocks pressure injury)  Last BM:  9/5  Height:   Ht Readings from Last 1 Encounters:  09/28/15 '5\' 1"'$  (1.549 m)    Weight:   Wt Readings from Last 1 Encounters:  09/28/15 126 lb 12.2 oz (57.5 kg)    Ideal Body Weight:  47.73 kg  BMI:  Body mass index is 23.95 kg/m.  Estimated Nutritional Needs:   Kcal:  1600-1800  Protein:  75-85 grams  Fluid:  1.6-1.8 L/day  EDUCATION NEEDS:  No education needs identified at this time    Jarome Matin, MS, RD, LDN Inpatient Clinical Dietitian Pager # [phone] After hours/weekend pager # [phone]

## 2015-09-29 NOTE — Anesthesia Preprocedure Evaluation (Addendum)
Anesthesia Evaluation  Patient identified by MRN, date of birth, ID band Patient awake  General Assessment Comment:Past Medical History: Diagnosis Date . Anemia 08/03/2015 . Cancer [facility])   malignant neoplasm of unknown origin . Cerebral aneurysm 2 brain surgeries 96 or 97 . Chronic neck pain 09/24/2014 . Diarrhea  . Emphysema of lung ([place]) 05/05/2015 . Hypertension  . Hyponatremia  . Left knee pain  . Left leg weakness 06/2015 . Lung abnormality  . Lung cancer ([place]) 10/07/2014 . Malignant neoplasm of unknown origin ([place])  . Migraine  . Nicotine dependence 09/24/2014 . Oral thrush 09/24/2014 . Paget disease of bone  . Pleurisy  . Renal insufficiency   Patient states " no kidney problems . S/P biopsy   of throat per patient. . Skin ulcer ([place]) 11/18/2014 . 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    Pulmonary pneumonia, resolved, COPD, Current Smoker,  Lung cancer   Pulmonary exam normal breath sounds clear to auscultation       Cardiovascular hypertension, + Peripheral Vascular Disease  Normal cardiovascular exam+ dysrhythmias  Rhythm:Regular Rate:Normal  H/o tachycardia.   Neuro/Psych  Headaches,  Neuromuscular disease negative psych ROS   GI/Hepatic Neg liver ROS, PUD,   Endo/Other  negative endocrine ROS  Renal/GU Renal disease  negative genitourinary   Musculoskeletal negative musculoskeletal ROS (+)   Abdominal   Peds negative pediatric ROS (+)  Hematology  (+) anemia ,   Anesthesia Other Findings   Reproductive/Obstetrics negative OB ROS                            Anesthesia Physical Anesthesia Plan  ASA: IV  Anesthesia Plan: MAC   Post-op Pain Management:     Induction: Intravenous  Airway Management Planned: Natural Airway  Additional Equipment:   Intra-op Plan:   Post-operative Plan:   Informed Consent: I have reviewed the patients History and Physical, chart, labs and discussed the procedure including the risks, benefits and alternatives for the proposed anesthesia with the patient or authorized representative who has indicated his/her understanding and acceptance.   Dental advisory given  Plan Discussed with: CRNA  Anesthesia Plan Comments:         Anesthesia Quick Evaluation

## 2015-09-29 NOTE — Consult Note (Signed)
Referring Provider:  Dr. Hal Hope Primary Care Physician:  Jani Gravel, MD Primary Gastroenterologist:  Dr. Augusto Gamble  Reason for Consultation:  Gastrointestinal bleeding  HPI: Megan Sims is a 60 y.o. female with lung cancer in remission (follows with Dr. Alvy Bimler), hypertension, diastolic dysfunction, recently admitted for pneumonia.  She presented to the ER last night because of fatigue. Patient states over the last few days she has been getting increasingly weak and noticed some black stools for one day. In the ER patient's hemoglobin is found to be around 4 grams (down from 9.6 grams just one week ago) with positive stool for occult blood.  BUN elevated at 82.  Patient denies taking any NSAIDs or Goody powder, but she is on prednisone for dermatomyositis.   Just had EGD by Dr. Oneida Alar on 07/07/2015 at which time a PEG was placed as well, findings as below:  - PATENT PEPTIC STRICTURE - MILD Gastritis. Biopsied. - One non-bleeding duodenal ulcer with no stigmata of bleeding. - MODERATE Duodenitis.  Dysphagia suspected to be secondary to radiation for lung cancer.  Has been transfused with 2 units PRBC's and Hgb in creased to 7.2 grams.  No BM since admission.  Is on PPI gtt.  Past Medical History:  Diagnosis Date  . Anemia 08/03/2015  . Cancer [facility])    malignant neoplasm of unknown origin  . Cerebral aneurysm 2 brain surgeries 96 or 97  . Chronic neck pain 09/24/2014  . Diarrhea   . Emphysema of lung ([place]) 05/05/2015  . Hypertension   . Hyponatremia   . Left knee pain   . Left leg weakness 06/2015  . Lung abnormality   . Lung cancer ([place]) 10/07/2014  . Malignant neoplasm of unknown origin ([place])   . Migraine   . Nicotine dependence 09/24/2014  . Oral thrush 09/24/2014  . Paget disease of bone   . Pleurisy   . Renal insufficiency    Patient states " no kidney problems  . S/P biopsy    of throat per patient.  . Skin ulcer ([place]) 11/18/2014  . 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 CEREBRAL ANEURYSM REPAIR  96 or 97  . CHEST TUBE INSERTION    . COLONOSCOPY     10 years ago in [place]   . ESOPHAGOGASTRODUODENOSCOPY  06/16/2015   Dr. Gala Romney: edentulous cricopharyngeus, esophageal plaques, biopsy consistent with candida  . ESOPHAGOGASTRODUODENOSCOPY N/A 06/16/2015   Procedure: ESOPHAGOGASTRODUODENOSCOPY (EGD);  Surgeon: Daneil Dolin, MD;  Location: [facility];  Service: Endoscopy;  Laterality: N/A;  . ESOPHAGOGASTRODUODENOSCOPY N/A 07/06/2015   Procedure: ESOPHAGOGASTRODUODENOSCOPY (EGD);  Surgeon: Danie Binder, MD;  Location: [facility];  Service: Endoscopy;  Laterality: N/A;  . INTRAMEDULLARY (IM) NAIL INTERTROCHANTERIC Left 08/07/2015   Procedure: INTRAMEDULLARY (IM) NAIL INTERTROCHANTRIC;  Surgeon: Melrose Nakayama, MD;  Location: [facility];  Service: Orthopedics;  Laterality: Left;  . LARYNGOSCOPY  Jun 22, 2015   [facility], Dr. Erik Obey: normal oropharynx, no lesions, mobile vocal cords with good airway.  . PEG PLACEMENT N/A 07/06/2015   Procedure: PERCUTANEOUS ENDOSCOPIC GASTROSTOMY (PEG) PLACEMENT;  Surgeon: Danie Binder, MD;  Location: [facility];  Service: Endoscopy;  Laterality: N/A;  . TUBAL LIGATION      Prior to Admission medications   Medication Sig Start Date End Date Taking? Authorizing Provider  albuterol (PROVENTIL HFA;VENTOLIN HFA) 108 (90 Base) MCG/ACT inhaler Inhale 1-2 puffs into the lungs every 6 (six) hours as  needed for wheezing or shortness of breath.   Yes Historical Provider, MD  amLODipine (NORVASC) 10 MG tablet Take 1 tablet (10 mg total) by mouth daily. 08/30/15  Yes Heath Lark, MD  fentaNYL (DURAGESIC - DOSED MCG/HR) 25 MCG/HR patch Place 1 patch (25 mcg total) onto the skin every 3 (three) days. 09/21/15  Yes Heath Lark, MD  furosemide (LASIX) 20 MG tablet Place 20 mg into feeding tube daily.   Yes Historical Provider, MD  hydrocortisone 2.5 % ointment Apply 1  application topically 2 (two) times daily. To face 09/20/15  Yes Historical Provider, MD  metoprolol tartrate (LOPRESSOR) 25 MG tablet Take 0.5 tablets (12.5 mg total) by mouth 2 (two) times daily. 09/10/15  Yes Donne Hazel, MD  morphine (ROXANOL) 20 MG/ML concentrated solution Place 1 mL (20 mg total) into feeding tube every 2 (two) hours as needed for severe pain. 09/21/15  Yes Heath Lark, MD  Nutritional Supplements (FEEDING SUPPLEMENT, JEVITY 1.2 CAL,) LIQD Place 237 mLs into feeding tube every 3 (three) hours. Patient taking differently: Place 391.5 mLs into feeding tube every 3 (three) hours.  07/08/15  Yes Rosita Fire, MD  nystatin (MYCOSTATIN) 100000 UNIT/ML suspension Take 5 mLs by mouth 4 (four) times daily. Swish and spit for thrush 09/20/15  Yes Historical Provider, MD  predniSONE (DELTASONE) 5 MG tablet Take by mouth once daily as directed: 15 mg daily for 3 days the 10 mg daily. Patient taking differently: Take 10 mg by mouth daily with breakfast.  09/24/15  Yes Heath Lark, MD  triamcinolone ointment (KENALOG) 0.1 % Apply 1 application topically 2 (two) times daily. From neck down. Do not use on face.. 09/20/15  Yes Historical Provider, MD  zolpidem (AMBIEN) 10 MG tablet Take 10 mg by mouth at bedtime as needed for sleep.   Yes Historical Provider, MD  HYDROcodone-acetaminophen (NORCO/VICODIN) 5-325 MG tablet Take 1-2 tablets by mouth every 6 (six) hours as needed for moderate pain. Patient not taking: Reported on 08/27/2015 08/16/15   Tiffany L Reed, DO  sucralfate (CARAFATE) 1 GM/10ML suspension Take 10 mLs (1 g total) by mouth 4 (four) times daily -  with meals and at bedtime. Patient not taking: Reported on 08/27/2015 08/06/15   Heath Lark, MD    Current Facility-Administered Medications  Medication Dose Route Frequency Provider Last Rate Last Dose  . 0.9 %  sodium chloride infusion   Intravenous Continuous Rise Patience, MD 10 mL/hr at 09/28/15 2200    . albuterol (PROVENTIL) (2.5  MG/3ML) 0.083% nebulizer solution 3 mL  3 mL Inhalation Q6H PRN Rise Patience, MD      . ceFEPIme (MAXIPIME) 2 g in dextrose 5 % 50 mL IVPB  2 g Intravenous Q12H Anh P Pham, RPH   2 g at 09/29/15 0751  . fentaNYL (DURAGESIC - dosed mcg/hr) patch 25 mcg  25 mcg Transdermal Q72H Rise Patience, MD   25 mcg at 09/28/15 2324  . hydrocortisone sodium succinate (SOLU-CORTEF) 100 MG injection 50 mg  50 mg Intravenous Q8H Rise Patience, MD   50 mg at 09/29/15 0600  . MEDLINE mouth rinse  15 mL Mouth Rinse BID Rise Patience, MD   15 mL at 09/28/15 2356  . morphine CONCENTRATE 10 MG/0.5ML oral solution 20 mg  20 mg Per Tube Q2H PRN Rise Patience, MD      . ondansetron [facility]) tablet 4 mg  4 mg Oral Q6H PRN Rise Patience, MD  Or  . ondansetron (ZOFRAN) injection 4 mg  4 mg Intravenous Q6H PRN Rise Patience, MD      . pantoprazole (PROTONIX) 80 mg in sodium chloride 0.9 % 250 mL (0.32 mg/mL) infusion  8 mg/hr Intravenous Continuous Davonna Belling, MD 25 mL/hr at 09/29/15 0749 8 mg/hr at 09/29/15 0749  . pantoprazole (PROTONIX) injection 40 mg  40 mg Intravenous Once Davonna Belling, MD      . triamcinolone ointment (KENALOG) 0.1 % 1 application  1 application Topical BID Rise Patience, MD   1 application at 26/37/85 0011  . vancomycin (VANCOCIN) 500 mg in sodium chloride 0.9 % 100 mL IVPB  500 mg Intravenous Q12H Anh P Pham, RPH        Allergies as of 09/28/2015 - Review Complete 09/28/2015  Allergen Reaction Noted  . Hydrocodone-acetaminophen Swelling 09/17/2014  . Demeclocycline Other (See Comments) 09/08/2015  . Tetracyclines & related  09/08/2015  . Tramadol  09/08/2015    Family History  Problem Relation Age of Onset  . Hypertension    . Diabetes    . Kidney disease    . Cancer Mother     throat ca  . Cancer Maternal Grandmother     thyroid ca  . Colon cancer Neg Hx     Social History   Social History  . Marital status: Married     Spouse name: N/A  . Number of children: N/A  . Years of education: N/A   Occupational History  . Not on file.   Social History Main Topics  . Smoking status: Current Every Day Smoker    Packs/day: 0.50    Years: 34.00    Types: Cigarettes  . Smokeless tobacco: Never Used     Comment: HOWEVER, will smoke 1 cigarette if "tragic" event (death), or if she needs it for her nerves   . Alcohol use No  . Drug use: No  . Sexual activity: Not on file   Other Topics Concern  . Not on file   Social History Narrative  . No narrative on file    Review of Systems: Ten point ROS is O/W negative except as mentioned in HPI.  Physical Exam: Vital signs in last 24 hours: Temp:  [98.4 F (36.9 C)-99.8 F (37.7 C)] 98.4 F (36.9 C) (09/06 0800) Pulse Rate:  [88-120] 88 (09/06 0830) Resp:  [16-25] 17 (09/06 0830) BP: (77-125)/(30-66) 108/40 (09/06 0830) SpO2:  [89 %-100 %] 98 % (09/06 0830) Weight:  [121 lb (54.9 kg)-126 lb 12.2 oz (57.5 kg)] 126 lb 12.2 oz (57.5 kg) (09/05 2130) Last BM Date: 09/28/15 General:  Alert, Well-developed, well-nourished, pleasant and cooperative in NAD Head:  Normocephalic and atraumatic. Eyes:  Sclera clear, no icterus.  Conjunctiva pink. Ears:  Normal auditory acuity. Mouth:  No deformity or lesions.  Mucus membranes dry. Lungs:  Clear throughout to auscultation.  No wheezes, crackles, or rhonchi.  Heart:  Regular rate and rhythm; no murmurs, clicks, rubs, or gallops. Abdomen:  Soft, non-distended.  BS present.  Tender near PEG site but site looks good with no sign of infection. Rectal:  Deferred  Msk:  Symmetrical without gross deformities. Pulses:  Normal pulses noted. Extremities:  Without clubbing or edema. Neurologic:  Alert and oriented x 4;  grossly normal neurologically. Skin:  Intact without significant lesions or rashes. Psych:  Alert and cooperative. Normal mood and affect.  Intake/Output from previous day: 09/05 0701 - 09/06 0700 In:  718.3 [I.V.:181.3; Blood:537] Out:  [place] Results:  Recent Labs  09/28/15 1835 09/29/15 0415  WBC 14.8* 11.8*  HGB 4.1* 7.2*  HCT 13.0* 21.2*  PLT 197 139*   BMET  Recent Labs  09/28/15 1835 09/29/15 0415  NA 135 137  K 4.5 4.1  CL 98* 103  CO2 26 28  GLUCOSE 145* 147*  BUN 82* 67*  CREATININE 0.97 0.86  CALCIUM 8.2* 7.8*   LFT  Recent Labs  09/29/15 0415  PROT 4.7*  ALBUMIN 2.4*  AST 42*  ALT 14  ALKPHOS 42  BILITOT 0.5   PT/INR  Recent Labs  09/28/15 1831  LABPROT 13.9  INR 1.07   Studies/Results: Dg Chest 2 View  Result Date: 09/28/2015 CLINICAL DATA:  Fever. EXAM: CHEST  2 VIEW COMPARISON:  Radiographs of September 21, 2015. FINDINGS: The heart size and mediastinal contours are within normal limits. No pneumothorax or pleural effusion is noted. Left internal jugular Port-A-Cath is unchanged in position. Left lung is clear. Increased right upper lobe opacity is noted concerning for worsening pneumonia or atelectasis. Atherosclerosis of thoracic aorta is noted. The visualized skeletal structures are unremarkable. IMPRESSION: Aortic atherosclerosis. Mildly increased right upper lobe opacity concerning for worsening atelectasis or pneumonia. Electronically Signed   By: Marijo Conception, M.D.   On: 09/28/2015 17:42   IMPRESSION:  -60 year old female with 5.5 gram drop in Hgb over the past week.  Reports one day of black stools at home and is heme positive here but no BM since admission.  BUN elevated.  No NSAID use but is on chronic prednisone.  S/p EGD in June with PEG placement and non-bleeding duodenal ulcer seen at that time.  Hgb improved to 7.2 grams s/p 2 units PRBC's.  Is on PPI gtt.  Rule out UGIB from ulcer, etc. -PNA:  On abx. -History of lung cancer s/p chemo and radiation:  In remission.  Follows with Dr. Alvy Bimler.  -Dysphagia:  Thought related to radiation.  S/p PEG placement in 06/2015. -Dermatomyositis:  On chronic prednisone, currently  on stress dose steroids.  PLAN: -Continue PPI gtt. -EGD today around noon. -Monitor Hgb and transfuse further prn.  ZEHR, JESSICA D.  09/29/2015, 9:10 AM  Pager number [phone]  GI ATTENDING  History, laboratories, x-rays, prior endoscopy report reviewed. Patient personally seen and examined. Agree with comprehensive consultation note as outlined above. Complicated patient with history of advanced lung cancer and dermatomyositis. Status post PEG for oral pharyngeal dysphagia. Noted to have duodenal ulcers. Has not been on PPI at home. Denies NSAIDs. Admitted with transient upper GI bleed. Stable. Upper endoscopy today. Son at bedside. Discussed.The nature of the procedure, as well as the risks, benefits, and alternatives were carefully and thoroughly reviewed with the patient. Ample time for discussion and questions allowed. The patient understood, was satisfied, and agreed to proceed.  Docia Chuck. Geri [place]., M.D. [facility] Division of Gastroenterology

## 2015-09-29 NOTE — Transfer of Care (Signed)
Immediate Anesthesia Transfer of Care Note  Patient: Megan Sims  Procedure(s) Performed: Procedure(s): ESOPHAGOGASTRODUODENOSCOPY (EGD) (N/A)  Patient Location: PACU and Endoscopy Unit  Anesthesia Type:MAC  Level of Consciousness: awake and alert   Airway & Oxygen Therapy: Patient Spontanous Breathing and Patient connected to nasal cannula oxygen  Post-op Assessment: Report given to RN and Post -op Vital signs reviewed and stable  Post vital signs: Reviewed and stable  Last Vitals:  Vitals:   09/29/15 0830 09/29/15 1141  BP: (!) 108/40 (!) 128/50  Pulse: 88   Resp: 17 (!) 28  Temp:      Last Pain:  Vitals:   09/29/15 1141  TempSrc:   PainSc: 10-Worst pain ever      Patients Stated Pain Goal: 2 (45/80/99 8338)  Complications: No apparent anesthesia complications

## 2015-09-29 NOTE — Telephone Encounter (Signed)
/  6/17 left a message to see if we need to reschedule patient for SP eval.

## 2015-09-30 ENCOUNTER — Telehealth: Payer: Self-pay | Admitting: *Deleted

## 2015-09-30 ENCOUNTER — Encounter: Payer: Medicaid Other | Admitting: Adult Health

## 2015-09-30 ENCOUNTER — Encounter (HOSPITAL_COMMUNITY): Payer: Self-pay | Admitting: Internal Medicine

## 2015-09-30 LAB — BASIC METABOLIC PANEL
ANION GAP: 8 (ref 5–15)
BUN: 53 mg/dL — ABNORMAL HIGH (ref 6–20)
CHLORIDE: 105 mmol/L (ref 101–111)
CO2: 28 mmol/L (ref 22–32)
CREATININE: 0.83 mg/dL (ref 0.44–1.00)
Calcium: 6.7 mg/dL — ABNORMAL LOW (ref 8.9–10.3)
GFR calc non Af Amer: >60 mL/min (ref >60)
Glucose, Bld: 187 mg/dL — ABNORMAL HIGH (ref 65–99)
Potassium: 4.4 mmol/L (ref 3.5–5.1)
Sodium: 141 mmol/L (ref 135–145)

## 2015-09-30 LAB — PREPARE RBC (CROSSMATCH)

## 2015-09-30 LAB — CBC
HEMATOCRIT: 21.5 % — AB (ref 36.0–46.0)
HEMOGLOBIN: 7.2 g/dL — AB (ref 12.0–15.0)
MCH: 30.5 pg (ref 26.0–34.0)
MCHC: 33.5 g/dL (ref 30.0–36.0)
MCV: 91.1 fL (ref 78.0–100.0)
Platelets: 183 10*3/uL (ref 150–400)
RBC: 2.36 MIL/uL — ABNORMAL LOW (ref 3.87–5.11)
RDW: 18.7 % — ABNORMAL HIGH (ref 11.5–15.5)
WBC: 13.4 10*3/uL — ABNORMAL HIGH (ref 4.0–10.5)

## 2015-09-30 LAB — GLUCOSE, CAPILLARY
GLUCOSE-CAPILLARY: 163 mg/dL — AB (ref 65–99)
Glucose-Capillary: 159 mg/dL — ABNORMAL HIGH (ref 65–99)
Glucose-Capillary: 184 mg/dL — ABNORMAL HIGH (ref 65–99)

## 2015-09-30 LAB — HEMOGLOBIN AND HEMATOCRIT, BLOOD
HCT: 26.2 % — ABNORMAL LOW (ref 36.0–46.0)
Hemoglobin: 8.7 g/dL — ABNORMAL LOW (ref 12.0–15.0)

## 2015-09-30 LAB — IRON AND TIBC
IRON: 39 ug/dL (ref 28–170)
Saturation Ratios: 17 % (ref 10.4–31.8)
TIBC: 231 ug/dL — ABNORMAL LOW (ref 250–450)
UIBC: 192 ug/dL

## 2015-09-30 LAB — FERRITIN: FERRITIN: 730 ng/mL — AB (ref 11–307)

## 2015-09-30 LAB — CLOTEST (H. PYLORI), BIOPSY: Helicobacter screen: NEGATIVE

## 2015-09-30 MED ORDER — PANTOPRAZOLE SODIUM 40 MG PO PACK: 40.0000 mg | PACK | Freq: Two times a day (BID) | ORAL | 1 refills | Status: AC

## 2015-09-30 MED ORDER — PRO-STAT SUGAR FREE PO LIQD: 30.0000 mL | Freq: Every day | ORAL | 0 refills | Status: AC

## 2015-09-30 MED ORDER — JEVITY 1.2 CAL PO LIQD: 391.5000 mL | ORAL | Status: AC

## 2015-09-30 MED ORDER — LEVOFLOXACIN 750 MG PO TABS
750.0000 mg | ORAL_TABLET | Freq: Every day | ORAL | Status: DC
  Administered 2015-09-30: 750 mg
  Filled 2015-09-30: qty 1

## 2015-09-30 MED ORDER — LEVOFLOXACIN 750 MG PO TABS: 750.0000 mg | ORAL_TABLET | Freq: Every day | ORAL | 0 refills | Status: AC

## 2015-09-30 MED ORDER — SODIUM CHLORIDE 0.9 % IV SOLN
Freq: Once | INTRAVENOUS | Status: AC
  Administered 2015-09-30: 10 mL/h via INTRAVENOUS

## 2015-09-30 MED ORDER — FREE WATER: 100.0000 mL | Freq: Four times a day (QID) | Status: AC

## 2015-09-30 MED ORDER — PREDNISONE 10 MG PO TABS: 10.0000 mg | ORAL_TABLET | Freq: Every day | ORAL | Status: DC

## 2015-09-30 MED ORDER — VITAMINS A & D EX OINT
TOPICAL_OINTMENT | CUTANEOUS | Status: AC
  Administered 2015-09-30: 08:00:00
  Filled 2015-09-30: qty 5

## 2015-09-30 MED ORDER — SODIUM CHLORIDE 0.9% FLUSH
10.0000 mL | Freq: Two times a day (BID) | INTRAVENOUS | Status: DC
  Administered 2015-09-30 (×2): 10 mL

## 2015-09-30 MED ORDER — HEPARIN SOD (PORK) LOCK FLUSH 100 UNIT/ML IV SOLN
500.0000 [IU] | INTRAVENOUS | Status: AC | PRN
  Administered 2015-09-30: 500 [IU]

## 2015-09-30 MED ORDER — LEVOFLOXACIN 750 MG PO TABS: 750.0000 mg | ORAL_TABLET | Freq: Every day | ORAL | Status: DC

## 2015-09-30 MED ORDER — SODIUM CHLORIDE 0.9% FLUSH: 10.0000 mL | INTRAVENOUS | Status: DC | PRN

## 2015-09-30 NOTE — Telephone Encounter (Signed)
VM from [place] at [facility] [place].  States they received another referral for Hospice from PCP. Previously we notified them that pt's Lung Cancer is not terminal.  She asks if pt's condition has changed?  Is her cancer terminal now?

## 2015-09-30 NOTE — Telephone Encounter (Signed)
Dermatomyositis is a very debilitating disorder; may not be terminal but can be associated with significant morbidities I can discuss with her and family further next week when she returns

## 2015-09-30 NOTE — Progress Notes (Signed)
Patient verbalized understanding of discharge instructions. Patient is stable at discharge. Patient's son is at bedside and nursing staff accompanied patient to lobby.

## 2015-09-30 NOTE — Telephone Encounter (Signed)
Called back and left VM for Beth at [facility].  Informed her of Lung Cancer still not considered terminal by Dr. Alvy Bimler.  Pt does have new diagnosis of Dermatomyositis being seen by Rheumatologist at [facility].  This diagnosis is very debilitating but not necessarily terminal.  Dr. Alvy Bimler will discuss w/ pt and family on her next visit regarding plan of care and possible Hospice care.

## 2015-09-30 NOTE — Discharge Summary (Signed)
Physician Discharge Summary  Megan Sims WNU:[id number] DOB: [birth year] DOA: 09/28/2015  PCP: Jani Gravel, MD  Admit date: 09/28/2015 Discharge date: 09/30/2015   Recommendations for Outpatient Follow-Up:   1. Biopsy from EGD pending-- h.pylori 2. 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 With home health  Discharge Condition: Improved.  Diet recommendation: tube feedings  Wound care: None.   History of Present Illness:   Megan Sims is a 60 y.o. female with lung cancer in remission, hypertension, diastolic dysfunction, recently admitted for pneumonia presents to the ER because of fatigue. Patient states over the last few days patient has been getting increasingly weak and noticed some black stools and chest pressure on walking. In the ER patient's hemoglobin is found to be around 4 with stool for occult blood positive. ER physician had discussed with on-call gastroenterologist for [place]. At this time patient is being admitted for blood transfusion and further management of acute GI bleed. Patient denies taking any NSAIDs or Goody powder. Patient is on prednisone for dermatomyositis.    Hospital Course by Problem:   Acute GI bleed EGD shows:  Acute duodenal ulcer without stigmata or active bleeding, Mild esophagitis and large caliber distal esophageal stricture Recs:1. Pantoprazole 40 mg twice daily for 4 weeks then once daily indefinitely2. Treat for Helicobacter pylori if CLO test positive. She can follow-up with her local GI in this regard if needed3. Avoid NSAIDs    Acute blood loss anemia -H/H improved  Possible pneumonia with chest  x-ray showing worsening infiltrate in the right upper lobe - patient is placed on empiric antibiotics for healthcare associated pneumonia.  -will need follow-up with radiological imaging until complete resolution.  Hypertension- resume home BP meds  Diastolic dysfunction last EF measured and October 2016 was 55% with grade 1 diastolic dysfunction   History of dermatomyositis on prednisone - resume  Bilateral lung cancerin remission. Being followed by oncologist-- will need radiographic follow up from PNA  Gastrostomy tube feedingdue to dysphagia.             -resume home tube feeds    Medical Consultants:    GI   Discharge Exam:   Vitals:   09/30/15 1115 09/30/15 1214  BP: (!) 163/51   Pulse: (!) 106   Resp: (!) 24   Temp: 97.8 F (36.6 C) 97.9 F (36.6 C)   Vitals:   09/30/15 1015 09/30/15 1100 09/30/15 1115 09/30/15 1214  BP:  (!) 172/64 (!) 163/51   Pulse: 82 (!) 109 (!) 106   Resp: 13 (!) 24 (!) 24   Temp:  98.1 F (36.7 C) 97.8 F (36.6 C) 97.9 F (36.6 C)  TempSrc:  Oral Oral Oral  SpO2: 95% 95% 94%   Weight:      Height:        Gen:  NAD-- improved and wanting to go home--- declined SNF placement   The results of significant diagnostics from this hospitalization (including imaging, microbiology, ancillary and laboratory) are listed below for reference.     Procedures and Diagnostic Studies:   Dg Chest 2 View  Result Date: 09/28/2015 CLINICAL DATA:  Fever. EXAM: CHEST  2 VIEW COMPARISON:  Radiographs of September 21, 2015. FINDINGS: The heart size and mediastinal contours are within normal limits. No pneumothorax or pleural effusion is noted. Left internal jugular Port-A-Cath is unchanged in position. Left lung is clear. Increased right upper lobe opacity is noted concerning for worsening pneumonia or atelectasis. Atherosclerosis of thoracic aorta is noted. The visualized skeletal structures are unremarkable. IMPRESSION: Aortic atherosclerosis.  Mildly increased right upper lobe opacity concerning for worsening atelectasis or pneumonia. Electronically Signed   By: Marijo Conception, M.D.   On: 09/28/2015 17:42     Labs:   Basic Metabolic Panel:  Recent Labs Lab 09/28/15 1835 09/29/15 0415 09/30/15 0511  NA 135 137 141  K 4.5 4.1 4.4  CL 98* 103 105  CO2 '26 28 28  '$ GLUCOSE 145* 147* 187*  BUN 82* 67* 53*  CREATININE 0.97 0.86 0.83  CALCIUM 8.2* 7.8* 6.7*   GFR Estimated Creatinine Clearance: 58.9 mL/min (by C-G formula based on SCr of 0.83 mg/dL). Liver Function Tests:  Recent Labs Lab 09/28/15 1835 09/29/15 0415  AST 50* 42*  ALT 14 14  ALKPHOS 49 42  BILITOT 0.2* 0.5  PROT 5.3* 4.7*  ALBUMIN 2.8* 2.4*   No results for input(s): LIPASE, AMYLASE in the last 168 hours. No results for input(s): AMMONIA in the last 168 hours. Coagulation profile  Recent Labs Lab 09/28/15 1831  INR 1.07    CBC:  Recent Labs Lab 09/28/15 1835 09/29/15 0415 09/30/15 0511 09/30/15 1220  WBC 14.8* 11.8* 13.4*  --   NEUTROABS 13.2*  --   --   --   HGB 4.1* 7.2* 7.2* 8.7*  HCT 13.0* 21.2* 21.5* 26.2*  MCV 94.2 89.8 91.1  --   PLT 197 139* 183  --    Cardiac Enzymes:  Recent Labs Lab 09/29/15 0700  TROPONINI 0.04*   BNP: Invalid input(s): POCBNP CBG:  Recent Labs Lab 09/29/15 2031 09/29/15 2333 09/30/15 0423 09/30/15 0741 09/30/15 1127  GLUCAP 149* 163* 163* 159* 184*   D-Dimer No results for input(s): DDIMER in the last 72 hours. Hgb A1c No results for input(s): HGBA1C in the last 72 hours. Lipid Profile No results for input(s): CHOL, HDL, LDLCALC, TRIG, CHOLHDL, LDLDIRECT in the last 72 hours. Thyroid function studies No results for input(s): TSH, T4TOTAL, T3FREE, THYROIDAB in the last 72 hours.  Invalid input(s): FREET3 Anemia work up  Recent Labs  09/30/15 0511  FERRITIN 730*  TIBC 231*  IRON 39   Microbiology Recent Results (from the past 240 hour(s))  Culture, blood (Routine x 2)      Status: None (Preliminary result)   Collection Time: 09/28/15  6:31 PM  Result Value Ref Range Status   Specimen Description BLOOD LEFT ARM  Final   Special Requests IN PEDIATRIC BOTTLE 3 CC  Final   Culture   Final    NO GROWTH < 24 HOURS Performed at [facility]    Report Status PENDING  Incomplete  Culture, blood (Routine x 2)     Status: None (Preliminary result)   Collection Time: 09/28/15  6:35 PM  Result Value Ref Range Status   Specimen Description BLOOD RIGHT ARM  Final   Special Requests BOTTLES DRAWN AEROBIC AND ANAEROBIC 5 CC [facility]  Final   Culture   Final    NO GROWTH < 24 HOURS Performed at [facility]    Report Status PENDING  Incomplete  Urine culture     Status: None (Preliminary result)   Collection  Time: 09/28/15  7:58 PM  Result Value Ref Range Status   Specimen Description URINE, RANDOM  Final   Special Requests NONE  Final   Culture   Final    CULTURE REINCUBATED FOR BETTER GROWTH Performed at [facility]    Report Status PENDING  Incomplete  MRSA PCR Screening     Status: None   Collection Time: 09/28/15  9:08 PM  Result Value Ref Range Status   MRSA by PCR NEGATIVE NEGATIVE Final    Comment:        The GeneXpert MRSA Assay (FDA approved for NASAL specimens only), is one component of a comprehensive MRSA colonization surveillance program. It is not intended to diagnose MRSA infection nor to guide or monitor treatment for MRSA infections.      Discharge Instructions:   Discharge Instructions    Discharge instructions    Complete by:  As directed   Pantoprazole 40 mg twice daily for 4 weeks then once daily indefinitely Follow up on H pylori biopsy Cbc 1 week Home health   Increase activity slowly    Complete by:  As directed       Medication List    STOP taking these medications   HYDROcodone-acetaminophen 5-325 MG tablet Commonly known as:  NORCO/VICODIN   sucralfate 1 GM/10ML suspension Commonly known  as:  CARAFATE   zolpidem 10 MG tablet Commonly known as:  AMBIEN     TAKE these medications   albuterol 108 (90 Base) MCG/ACT inhaler Commonly known as:  PROVENTIL HFA;VENTOLIN HFA Inhale 1-2 puffs into the lungs every 6 (six) hours as needed for wheezing or shortness of breath.   amLODipine 10 MG tablet Commonly known as:  NORVASC Take 1 tablet (10 mg total) by mouth daily.   feeding supplement (JEVITY 1.2 CAL) Liqd Place 392 mLs into feeding tube every 3 (three) hours.   feeding supplement (PRO-STAT SUGAR FREE 64) Liqd Place 30 mLs into feeding tube daily.   fentaNYL 25 MCG/HR patch Commonly known as:  DURAGESIC - dosed mcg/hr Place 1 patch (25 mcg total) onto the skin every 3 (three) days.   free water Soln Place 100 mLs into feeding tube every 6 (six) hours.   furosemide 20 MG tablet Commonly known as:  LASIX Place 20 mg into feeding tube daily.   hydrocortisone 2.5 % ointment Apply 1 application topically 2 (two) times daily. To face   levofloxacin 750 MG tablet Commonly known as:  LEVAQUIN Place 1 tablet (750 mg total) into feeding tube daily.   metoprolol tartrate 25 MG tablet Commonly known as:  LOPRESSOR Take 0.5 tablets (12.5 mg total) by mouth 2 (two) times daily.   morphine 20 MG/ML concentrated solution Commonly known as:  ROXANOL Place 1 mL (20 mg total) into feeding tube every 2 (two) hours as needed for severe pain.   nystatin 100000 UNIT/ML suspension Commonly known as:  MYCOSTATIN Take 5 mLs by mouth 4 (four) times daily. Swish and spit for thrush   pantoprazole sodium 40 mg/20 mL Pack Commonly known as:  PROTONIX Place 20 mLs (40 mg total) into feeding tube 2 (two) times daily.   predniSONE 5 MG tablet Commonly known as:  DELTASONE Take by mouth once daily as directed: 15 mg daily for 3 days the 10 mg daily. What changed:  how much to take  how to take this  when to take this  additional instructions   triamcinolone ointment 0.1  % Commonly known as:  KENALOG  Apply 1 application topically 2 (two) times daily. From neck down. Do not use on face..         Time coordinating discharge: 35 min  Signed:  Jaber Dunlow U Evelyne Makepeace   [facility] 09/30/2015, 1:28 PM

## 2015-10-01 ENCOUNTER — Other Ambulatory Visit: Payer: Self-pay

## 2015-10-01 ENCOUNTER — Inpatient Hospital Stay (HOSPITAL_COMMUNITY)
Admission: EM | Admit: 2015-10-01 | Discharge: 2015-10-24 | DRG: 870 | Disposition: E | Payer: Medicaid Other | Attending: Internal Medicine | Admitting: Internal Medicine

## 2015-10-01 ENCOUNTER — Emergency Department (HOSPITAL_COMMUNITY): Payer: Medicaid Other

## 2015-10-01 ENCOUNTER — Encounter (HOSPITAL_COMMUNITY): Payer: Self-pay | Admitting: Emergency Medicine

## 2015-10-01 DIAGNOSIS — Z66 Do not resuscitate: Secondary | ICD-10-CM | POA: Diagnosis present

## 2015-10-01 DIAGNOSIS — Z923 Personal history of irradiation: Secondary | ICD-10-CM

## 2015-10-01 DIAGNOSIS — R0603 Acute respiratory distress: Secondary | ICD-10-CM

## 2015-10-01 DIAGNOSIS — C3492 Malignant neoplasm of unspecified part of left bronchus or lung: Secondary | ICD-10-CM | POA: Diagnosis not present

## 2015-10-01 DIAGNOSIS — R1310 Dysphagia, unspecified: Secondary | ICD-10-CM | POA: Diagnosis present

## 2015-10-01 DIAGNOSIS — Z515 Encounter for palliative care: Secondary | ICD-10-CM | POA: Diagnosis not present

## 2015-10-01 DIAGNOSIS — Y95 Nosocomial condition: Secondary | ICD-10-CM | POA: Diagnosis present

## 2015-10-01 DIAGNOSIS — I5032 Chronic diastolic (congestive) heart failure: Secondary | ICD-10-CM | POA: Diagnosis present

## 2015-10-01 DIAGNOSIS — Z85118 Personal history of other malignant neoplasm of bronchus and lung: Secondary | ICD-10-CM

## 2015-10-01 DIAGNOSIS — R571 Hypovolemic shock: Secondary | ICD-10-CM | POA: Diagnosis present

## 2015-10-01 DIAGNOSIS — J9601 Acute respiratory failure with hypoxia: Secondary | ICD-10-CM | POA: Diagnosis not present

## 2015-10-01 DIAGNOSIS — D696 Thrombocytopenia, unspecified: Secondary | ICD-10-CM | POA: Diagnosis present

## 2015-10-01 DIAGNOSIS — C3411 Malignant neoplasm of upper lobe, right bronchus or lung: Secondary | ICD-10-CM | POA: Diagnosis not present

## 2015-10-01 DIAGNOSIS — D539 Nutritional anemia, unspecified: Secondary | ICD-10-CM | POA: Diagnosis not present

## 2015-10-01 DIAGNOSIS — D6489 Other specified anemias: Secondary | ICD-10-CM | POA: Diagnosis present

## 2015-10-01 DIAGNOSIS — G8929 Other chronic pain: Secondary | ICD-10-CM | POA: Diagnosis present

## 2015-10-01 DIAGNOSIS — M542 Cervicalgia: Secondary | ICD-10-CM

## 2015-10-01 DIAGNOSIS — D62 Acute posthemorrhagic anemia: Secondary | ICD-10-CM | POA: Diagnosis present

## 2015-10-01 DIAGNOSIS — K922 Gastrointestinal hemorrhage, unspecified: Secondary | ICD-10-CM

## 2015-10-01 DIAGNOSIS — Z452 Encounter for adjustment and management of vascular access device: Secondary | ICD-10-CM

## 2015-10-01 DIAGNOSIS — M6281 Muscle weakness (generalized): Secondary | ICD-10-CM

## 2015-10-01 DIAGNOSIS — I110 Hypertensive heart disease with heart failure: Secondary | ICD-10-CM | POA: Diagnosis present

## 2015-10-01 DIAGNOSIS — K264 Chronic or unspecified duodenal ulcer with hemorrhage: Secondary | ICD-10-CM | POA: Diagnosis present

## 2015-10-01 DIAGNOSIS — M889 Osteitis deformans of unspecified bone: Secondary | ICD-10-CM | POA: Diagnosis present

## 2015-10-01 DIAGNOSIS — E876 Hypokalemia: Secondary | ICD-10-CM | POA: Diagnosis not present

## 2015-10-01 DIAGNOSIS — N179 Acute kidney failure, unspecified: Secondary | ICD-10-CM | POA: Diagnosis present

## 2015-10-01 DIAGNOSIS — B370 Candidal stomatitis: Secondary | ICD-10-CM | POA: Diagnosis present

## 2015-10-01 DIAGNOSIS — G9340 Encephalopathy, unspecified: Secondary | ICD-10-CM | POA: Diagnosis not present

## 2015-10-01 DIAGNOSIS — K3182 Dieulafoy lesion (hemorrhagic) of stomach and duodenum: Secondary | ICD-10-CM

## 2015-10-01 DIAGNOSIS — D500 Iron deficiency anemia secondary to blood loss (chronic): Secondary | ICD-10-CM

## 2015-10-01 DIAGNOSIS — J189 Pneumonia, unspecified organism: Secondary | ICD-10-CM | POA: Diagnosis present

## 2015-10-01 DIAGNOSIS — Z931 Gastrostomy status: Secondary | ICD-10-CM

## 2015-10-01 DIAGNOSIS — R0600 Dyspnea, unspecified: Secondary | ICD-10-CM | POA: Diagnosis not present

## 2015-10-01 DIAGNOSIS — F17210 Nicotine dependence, cigarettes, uncomplicated: Secondary | ICD-10-CM | POA: Diagnosis present

## 2015-10-01 DIAGNOSIS — R739 Hyperglycemia, unspecified: Secondary | ICD-10-CM | POA: Diagnosis not present

## 2015-10-01 DIAGNOSIS — K921 Melena: Secondary | ICD-10-CM | POA: Diagnosis not present

## 2015-10-01 DIAGNOSIS — Z9221 Personal history of antineoplastic chemotherapy: Secondary | ICD-10-CM

## 2015-10-01 DIAGNOSIS — E872 Acidosis: Secondary | ICD-10-CM | POA: Diagnosis present

## 2015-10-01 DIAGNOSIS — R579 Shock, unspecified: Secondary | ICD-10-CM | POA: Diagnosis not present

## 2015-10-01 DIAGNOSIS — E860 Dehydration: Secondary | ICD-10-CM | POA: Diagnosis present

## 2015-10-01 DIAGNOSIS — E870 Hyperosmolality and hypernatremia: Secondary | ICD-10-CM | POA: Diagnosis not present

## 2015-10-01 DIAGNOSIS — A419 Sepsis, unspecified organism: Secondary | ICD-10-CM | POA: Diagnosis present

## 2015-10-01 DIAGNOSIS — C3491 Malignant neoplasm of unspecified part of right bronchus or lung: Secondary | ICD-10-CM | POA: Diagnosis present

## 2015-10-01 DIAGNOSIS — Z01818 Encounter for other preprocedural examination: Secondary | ICD-10-CM

## 2015-10-01 DIAGNOSIS — J9690 Respiratory failure, unspecified, unspecified whether with hypoxia or hypercapnia: Secondary | ICD-10-CM

## 2015-10-01 DIAGNOSIS — R6521 Severe sepsis with septic shock: Secondary | ICD-10-CM | POA: Diagnosis present

## 2015-10-01 DIAGNOSIS — K269 Duodenal ulcer, unspecified as acute or chronic, without hemorrhage or perforation: Secondary | ICD-10-CM | POA: Diagnosis not present

## 2015-10-01 DIAGNOSIS — T380X5A Adverse effect of glucocorticoids and synthetic analogues, initial encounter: Secondary | ICD-10-CM | POA: Diagnosis not present

## 2015-10-01 DIAGNOSIS — D649 Anemia, unspecified: Secondary | ICD-10-CM | POA: Diagnosis not present

## 2015-10-01 DIAGNOSIS — M360 Dermato(poly)myositis in neoplastic disease: Secondary | ICD-10-CM | POA: Diagnosis not present

## 2015-10-01 DIAGNOSIS — D72829 Elevated white blood cell count, unspecified: Secondary | ICD-10-CM | POA: Diagnosis not present

## 2015-10-01 DIAGNOSIS — Z7952 Long term (current) use of systemic steroids: Secondary | ICD-10-CM

## 2015-10-01 DIAGNOSIS — J9600 Acute respiratory failure, unspecified whether with hypoxia or hypercapnia: Secondary | ICD-10-CM | POA: Diagnosis not present

## 2015-10-01 DIAGNOSIS — Z7189 Other specified counseling: Secondary | ICD-10-CM

## 2015-10-01 DIAGNOSIS — R578 Other shock: Secondary | ICD-10-CM | POA: Diagnosis present

## 2015-10-01 DIAGNOSIS — F17200 Nicotine dependence, unspecified, uncomplicated: Secondary | ICD-10-CM | POA: Diagnosis present

## 2015-10-01 DIAGNOSIS — I10 Essential (primary) hypertension: Secondary | ICD-10-CM | POA: Diagnosis present

## 2015-10-01 LAB — TYPE AND SCREEN
ABO/RH(D): O POS
ANTIBODY SCREEN: NEGATIVE
Unit division: 0
Unit division: 0
Unit division: 0

## 2015-10-01 LAB — CBC
HEMATOCRIT: 27.2 % — AB (ref 36.0–46.0)
Hemoglobin: 8.4 g/dL — ABNORMAL LOW (ref 12.0–15.0)
MCH: 28.8 pg (ref 26.0–34.0)
MCHC: 30.9 g/dL (ref 30.0–36.0)
MCV: 93.2 fL (ref 78.0–100.0)
Platelets: 128 10*3/uL — ABNORMAL LOW (ref 150–400)
RBC: 2.92 MIL/uL — ABNORMAL LOW (ref 3.87–5.11)
RDW: 20.1 % — AB (ref 11.5–15.5)
WBC: 13.4 10*3/uL — ABNORMAL HIGH (ref 4.0–10.5)

## 2015-10-01 LAB — URINALYSIS, ROUTINE W REFLEX MICROSCOPIC
BILIRUBIN URINE: NEGATIVE
GLUCOSE, UA: NEGATIVE mg/dL
KETONES UR: NEGATIVE mg/dL
Nitrite: NEGATIVE
PH: 6 (ref 5.0–8.0)
PROTEIN: NEGATIVE mg/dL
Specific Gravity, Urine: 1.016 (ref 1.005–1.030)

## 2015-10-01 LAB — COMPREHENSIVE METABOLIC PANEL
ALBUMIN: 2 g/dL — AB (ref 3.5–5.0)
ALT: 14 U/L (ref 14–54)
AST: 47 U/L — AB (ref 15–41)
Alkaline Phosphatase: 48 U/L (ref 38–126)
Anion gap: 10 (ref 5–15)
BUN: 62 mg/dL — AB (ref 6–20)
CHLORIDE: 108 mmol/L (ref 101–111)
CO2: 24 mmol/L (ref 22–32)
Calcium: 8.2 mg/dL — ABNORMAL LOW (ref 8.9–10.3)
Creatinine, Ser: 0.85 mg/dL (ref 0.44–1.00)
GFR calc Af Amer: >60 mL/min (ref >60)
GFR calc non Af Amer: >60 mL/min (ref >60)
GLUCOSE: 99 mg/dL (ref 65–99)
POTASSIUM: 3.5 mmol/L (ref 3.5–5.1)
Sodium: 142 mmol/L (ref 135–145)
Total Bilirubin: 0.8 mg/dL (ref 0.3–1.2)
Total Protein: 4.2 g/dL — ABNORMAL LOW (ref 6.5–8.1)

## 2015-10-01 LAB — PROCALCITONIN: PROCALCITONIN: 2.37 ng/mL

## 2015-10-01 LAB — BRAIN NATRIURETIC PEPTIDE: B NATRIURETIC PEPTIDE 5: 402.4 pg/mL — AB (ref 0.0–100.0)

## 2015-10-01 LAB — URINE CULTURE: Culture: 40000 — AB

## 2015-10-01 LAB — TROPONIN I: Troponin I: 0.1 ng/mL (ref <0.03)

## 2015-10-01 LAB — LIPASE, BLOOD: LIPASE: 17 U/L (ref 11–51)

## 2015-10-01 LAB — LACTIC ACID, PLASMA: LACTIC ACID, VENOUS: 4.8 mmol/L — AB (ref 0.5–1.9)

## 2015-10-01 LAB — URINE MICROSCOPIC-ADD ON

## 2015-10-01 LAB — I-STAT CG4 LACTIC ACID, ED: Lactic Acid, Venous: 3.84 mmol/L (ref 0.5–1.9)

## 2015-10-01 LAB — PROTIME-INR
INR: 1.27
PROTHROMBIN TIME: 16 s — AB (ref 11.4–15.2)

## 2015-10-01 LAB — STREP PNEUMONIAE URINARY ANTIGEN: Strep Pneumo Urinary Antigen: NEGATIVE

## 2015-10-01 MED ORDER — FREE WATER
100.0000 mL | Freq: Four times a day (QID) | Status: DC
  Administered 2015-10-01: 100 mL

## 2015-10-01 MED ORDER — MORPHINE SULFATE (CONCENTRATE) 10 MG/0.5ML PO SOLN
20.0000 mg | ORAL | Status: DC | PRN
  Administered 2015-10-13 – 2015-10-15 (×8): 20 mg
  Filled 2015-10-01 (×9): qty 1

## 2015-10-01 MED ORDER — SODIUM CHLORIDE 0.9 % IV BOLUS (SEPSIS): 250.0000 mL | Freq: Once | INTRAVENOUS | Status: DC

## 2015-10-01 MED ORDER — DEXTROSE 5 % IV SOLN
1.0000 g | Freq: Three times a day (TID) | INTRAVENOUS | Status: DC
  Administered 2015-10-01 – 2015-10-03 (×5): 1 g via INTRAVENOUS
  Filled 2015-10-01 (×10): qty 1

## 2015-10-01 MED ORDER — FUROSEMIDE 20 MG PO TABS
20.0000 mg | ORAL_TABLET | Freq: Every day | ORAL | Status: DC
Start: 2015-10-01 — End: 2015-10-02
  Filled 2015-10-01: qty 1

## 2015-10-01 MED ORDER — SODIUM CHLORIDE 0.9 % IV BOLUS (SEPSIS)
1000.0000 mL | Freq: Once | INTRAVENOUS | Status: AC
  Administered 2015-10-01: 1000 mL via INTRAVENOUS

## 2015-10-01 MED ORDER — SODIUM CHLORIDE 0.9 % IV BOLUS (SEPSIS): 1000.0000 mL | Freq: Once | INTRAVENOUS | Status: DC

## 2015-10-01 MED ORDER — PREDNISONE 10 MG PO TABS
10.0000 mg | ORAL_TABLET | Freq: Every day | ORAL | Status: DC
  Filled 2015-10-01: qty 1

## 2015-10-01 MED ORDER — VANCOMYCIN HCL IN DEXTROSE 1-5 GM/200ML-% IV SOLN
1000.0000 mg | Freq: Once | INTRAVENOUS | Status: AC
  Administered 2015-10-01: 1000 mg via INTRAVENOUS
  Filled 2015-10-01: qty 200

## 2015-10-01 MED ORDER — PANTOPRAZOLE SODIUM 40 MG PO PACK
40.0000 mg | PACK | Freq: Two times a day (BID) | ORAL | Status: DC
Start: 2015-10-01 — End: 2015-10-02
  Administered 2015-10-01: 40 mg
  Filled 2015-10-01 (×2): qty 20

## 2015-10-01 MED ORDER — JEVITY 1.2 CAL PO LIQD
391.5000 mL | ORAL | Status: DC
Start: 2015-10-01 — End: 2015-10-03
  Administered 2015-10-01: 391.5 mL
  Filled 2015-10-01: qty 474
  Filled 2015-10-01 (×2): qty 1000
  Filled 2015-10-01: qty 474
  Filled 2015-10-01 (×2): qty 1000
  Filled 2015-10-01 (×2): qty 474
  Filled 2015-10-01 (×5): qty 1000
  Filled 2015-10-01 (×2): qty 474
  Filled 2015-10-01 (×2): qty 1000
  Filled 2015-10-01: qty 474
  Filled 2015-10-01 (×5): qty 1000

## 2015-10-01 MED ORDER — VANCOMYCIN HCL 500 MG IV SOLR
500.0000 mg | Freq: Two times a day (BID) | INTRAVENOUS | Status: DC
  Administered 2015-10-02 – 2015-10-04 (×5): 500 mg via INTRAVENOUS
  Filled 2015-10-01 (×8): qty 500

## 2015-10-01 MED ORDER — AMLODIPINE BESYLATE 10 MG PO TABS
10.0000 mg | ORAL_TABLET | Freq: Every day | ORAL | Status: DC
  Filled 2015-10-01: qty 1

## 2015-10-01 MED ORDER — CEFEPIME HCL 1 G IJ SOLR
1.0000 g | Freq: Once | INTRAMUSCULAR | Status: AC
  Administered 2015-10-01: 1 g via INTRAVENOUS
  Filled 2015-10-01: qty 1

## 2015-10-01 MED ORDER — SODIUM CHLORIDE 0.9% FLUSH
10.0000 mL | INTRAVENOUS | Status: DC | PRN
  Administered 2015-10-15: 30 mL
  Filled 2015-10-01: qty 40

## 2015-10-01 MED ORDER — SODIUM CHLORIDE 0.9 % IV BOLUS (SEPSIS): 500.0000 mL | Freq: Once | INTRAVENOUS | Status: DC

## 2015-10-01 MED ORDER — SODIUM CHLORIDE 0.9 % IV SOLN
INTRAVENOUS | Status: DC
  Administered 2015-10-01 – 2015-10-03 (×4): via INTRAVENOUS

## 2015-10-01 MED ORDER — PRO-STAT SUGAR FREE PO LIQD
30.0000 mL | Freq: Every day | ORAL | Status: DC
  Administered 2015-10-01: 30 mL
  Filled 2015-10-01 (×2): qty 30

## 2015-10-01 MED ORDER — METOPROLOL TARTRATE 12.5 MG HALF TABLET
12.5000 mg | ORAL_TABLET | Freq: Two times a day (BID) | ORAL | Status: DC
  Filled 2015-10-01: qty 1

## 2015-10-01 NOTE — ED Notes (Signed)
Pt helped on the bedpan, sheets wet, changed linens.  Pt refuses bed bath or new gown.  Call to floor, RN to call me back

## 2015-10-01 NOTE — ED Provider Notes (Signed)
[facility] DEPT Provider Note   CSN: [id number] Arrival date & time: 10/05/2015  1057     History   Chief Complaint Chief Complaint  Patient presents with  . multiple complaints    HPI Megan Sims is a 60 y.o. female.  HPI  60 y.o. female with a hx of Anemia, Lung Cancer, Dermatomyostis, Diastolic Dysfunction, presents to the Emergency Department today complaining of nausea, vomiting and abdominal pain since DC from admission last night due to anemia from GI bleed. Admitted on 09-28-15. EGD done on 09-29-15 which found a acute duodenal ulcer without stigmata or active bleeding. She was given pantoprazole with H. Pylori biopsy pending. Pt also with possible HCAP and treated with empiric ABX (levaquin), but pt unsure of taking abx at home. Pt notes CP as well as SOB due to mucous in throat that she cannot seem to get ride of. Denies home oxygen use. No N/V/D. No diaphoresis. No headaches. No fevers that she reports at home, but states she feels warm. No other symptoms noted.    CT Angio 08-27-15- No PE [place] LUE 09-16-15- No DVT  Past Medical History:  Diagnosis Date  . Anemia 08/03/2015  . Cancer [facility]    malignant neoplasm of unknown origin  . Cerebral aneurysm 2 brain surgeries 96 or 97  . Chronic neck pain 09/24/2014  . Diarrhea   . Emphysema of lung ([place]) 05/05/2015  . Hypertension   . Hyponatremia   . Left knee pain   . Left leg weakness 06/2015  . Lung abnormality   . Lung cancer ([place]) 10/07/2014  . Malignant neoplasm of unknown origin ([place])   . Migraine   . Nicotine dependence 09/24/2014  . Oral thrush 09/24/2014  . Paget disease of bone   . Pleurisy   . Renal insufficiency    Patient states " no kidney problems  . S/P biopsy    of throat per patient.  . Skin ulcer ([place]) 11/18/2014  . 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 Pressure ulcer 09/29/2015  . Duodenal ulcer hemorrhagic   . Acute GI bleeding 09/28/2015    . HCAP (healthcare-associated pneumonia) 09/08/2015  . Diarrhea 09/02/2015  . Closed fracture of left femur ([place])   . Bleeding   . Acute blood loss anemia   . Fall from slip, trip, or stumble 08/07/2015  . Left femoral shaft fracture ([place]) 08/07/2015  . 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 S/P gastrostomy ([place]) 08/04/2015  . Deficiency anemia 08/03/2015  . Dehydration 07/02/2015  . Candida esophagitis ([place])   . Anemia due to other cause   . Mucosal abnormality of stomach   . Pill esophagitis due to tetracycline 05/24/2015  . UTI (lower urinary tract infection) 05/24/2015  . Emphysema of lung ([place]) 05/05/2015  . Rash, skin 05/05/2015  . Severe sepsis ([place]) 04/10/2015  . AKI (acute kidney injury) ([place]) 04/10/2015  . Hyponatremia 04/10/2015  . Sepsis due to pneumonia ([place])   . CAP (community acquired pneumonia) 04/09/2015  . Skin ulcer ([place]) 11/18/2014  . Musculoskeletal chest pain 10/25/2014  . Pain in the chest   . Tachycardia   . Anemia due to antineoplastic chemotherapy 10/21/2014  . Cancer of upper lobe of right lung ([place]) 10/07/2014  . Oral thrush 09/24/2014  . Nicotine dependence 09/24/2014  . Chronic neck pain 09/24/2014  . Excessive weight loss 09/24/2014  . Stevens-Johnson syndrome ([place]) 09/24/2014  . Bilateral  lung cancer ([place])   . Paget's disease of bone   . Dysphagia 09/05/2014  . Essential hypertension 09/05/2014    Past Surgical History:  Procedure Laterality Date  . CEREBRAL ANEURYSM REPAIR  96 or 97  . CHEST TUBE INSERTION    . COLONOSCOPY     10 years ago in [place]   . ESOPHAGOGASTRODUODENOSCOPY  06/16/2015   Dr. Gala Romney: edentulous cricopharyngeus, esophageal plaques, biopsy consistent with candida  . ESOPHAGOGASTRODUODENOSCOPY N/A 06/16/2015   Procedure: ESOPHAGOGASTRODUODENOSCOPY (EGD);  Surgeon: Daneil Dolin, MD;  Location[facility];  Service: Endoscopy;  Laterality: N/A;  . ESOPHAGOGASTRODUODENOSCOPY N/A  07/06/2015   Procedure: ESOPHAGOGASTRODUODENOSCOPY (EGD);  Surgeon: Danie Binder, MD;  Location: [facility];  Service: Endoscopy;  Laterality: N/A;  . ESOPHAGOGASTRODUODENOSCOPY N/A 09/29/2015   Procedure: ESOPHAGOGASTRODUODENOSCOPY (EGD);  Surgeon: Irene Shipper, MD;  Location: [facility];  Service: Endoscopy;  Laterality: N/A;  . INTRAMEDULLARY (IM) NAIL INTERTROCHANTERIC Left 08/07/2015   Procedure: INTRAMEDULLARY (IM) NAIL INTERTROCHANTRIC;  Surgeon: Melrose Nakayama, MD;  Location: [facility] ORS;  Service: Orthopedics;  Laterality: Left;  . LARYNGOSCOPY  Jun 22, 2015   [facility], Dr. Erik Obey: normal oropharynx, no lesions, mobile vocal cords with good airway.  . PEG PLACEMENT N/A 07/06/2015   Procedure: PERCUTANEOUS ENDOSCOPIC GASTROSTOMY (PEG) PLACEMENT;  Surgeon: Danie Binder, MD;  Location: [facility];  Service: Endoscopy;  Laterality: N/A;  . TUBAL LIGATION      OB History    Gravida Para Term Preterm AB Living             3   SAB TAB Ectopic Multiple Live Births                   Home Medications    Prior to Admission medications   Medication Sig Start Date End Date Taking? Authorizing Provider  albuterol (PROVENTIL HFA;VENTOLIN HFA) 108 (90 Base) MCG/ACT inhaler Inhale 1-2 puffs into the lungs every 6 (six) hours as needed for wheezing or shortness of breath.    Historical Provider, MD  Amino Acids-Protein Hydrolys (FEEDING SUPPLEMENT, PRO-STAT SUGAR FREE 64,) LIQD Place 30 mLs into feeding tube daily. 09/30/15   Geradine Girt, DO  amLODipine (NORVASC) 10 MG tablet Take 1 tablet (10 mg total) by mouth daily. 08/30/15   Heath Lark, MD  fentaNYL (DURAGESIC - DOSED MCG/HR) 25 MCG/HR patch Place 1 patch (25 mcg total) onto the skin every 3 (three) days. 09/21/15   Heath Lark, MD  furosemide (LASIX) 20 MG tablet Place 20 mg into feeding tube daily.    Historical Provider, MD  hydrocortisone 2.5 % ointment Apply 1 application topically 2 (two) times daily. To face 09/20/15   Historical  Provider, MD  levofloxacin (LEVAQUIN) 750 MG tablet Place 1 tablet (750 mg total) into feeding tube daily. 09/30/15   Geradine Girt, DO  metoprolol tartrate (LOPRESSOR) 25 MG tablet Take 0.5 tablets (12.5 mg total) by mouth 2 (two) times daily. 09/10/15   Donne Hazel, MD  morphine (ROXANOL) 20 MG/ML concentrated solution Place 1 mL (20 mg total) into feeding tube every 2 (two) hours as needed for severe pain. 09/21/15   Heath Lark, MD  Nutritional Supplements (FEEDING SUPPLEMENT, JEVITY 1.2 CAL,) LIQD Place 392 mLs into feeding tube every 3 (three) hours. 09/30/15   Geradine Girt, DO  nystatin (MYCOSTATIN) 100000 UNIT/ML suspension Take 5 mLs by mouth 4 (four) times daily. Swish and spit for thrush 09/20/15   Historical Provider, MD  pantoprazole sodium (PROTONIX) 40 mg/20 mL PACK Place 20 mLs (40 mg total) into feeding tube 2 (two) times daily. 09/30/15   Geradine Girt, DO  predniSONE (DELTASONE) 5 MG tablet Take by mouth once daily as directed: 15 mg daily for 3 days the 10 mg daily. Patient taking differently: Take 10 mg by mouth daily with breakfast.  09/24/15   Heath Lark, MD  triamcinolone ointment (KENALOG) 0.1 % Apply 1 application topically 2 (two) times daily. From neck down. Do not use on face.. 09/20/15   Historical Provider, MD  Water For Irrigation, Sterile (FREE WATER) SOLN Place 100 mLs into feeding tube every 6 (six) hours. 09/30/15   Geradine Girt, DO    Family History Family History  Problem Relation Age of Onset  . Hypertension    . Diabetes    . Kidney disease    . Cancer Mother     throat ca  . Cancer Maternal Grandmother     thyroid ca  . Colon cancer Neg Hx     Social History Social History  Substance Use Topics  . Smoking status: Current Every Day Smoker    Packs/day: 0.50    Years: 34.00    Types: Cigarettes  . Smokeless tobacco: Never Used     Comment: HOWEVER, will smoke 1 cigarette if "tragic" event (death), or if she needs it for her nerves   . Alcohol use No      Allergies   Hydrocodone-acetaminophen; Demeclocycline; Tetracyclines & related; and Tramadol   Review of Systems Review of Systems ROS reviewed and all are negative for acute change except as noted in the HPI.  Physical Exam Updated Vital Signs BP (!) 83/61 (BP Location: Left Arm)   Temp 98.6 F (37 C) (Oral)   Resp 22   SpO2 93%   Physical Exam  Constitutional: She is oriented to person, place, and time. Vital signs are normal. She appears well-developed and well-nourished.  HENT:  Head: Normocephalic and atraumatic.  Right Ear: Hearing normal.  Left Ear: Hearing normal.  Eyes: Conjunctivae and EOM are normal. Pupils are equal, round, and reactive to light.  Neck: Normal range of motion. Neck supple.  Cardiovascular: Regular rhythm, normal heart sounds, intact distal pulses and normal pulses.  Tachycardia present.   Pulmonary/Chest: Effort normal. No accessory muscle usage. No tachypnea. No respiratory distress. She has rales in the right upper field, the right lower field, the left upper field and the left lower field.  Abdominal: Soft. Normal appearance and bowel sounds are normal. There is tenderness in the epigastric area. There is no rigidity, no rebound, no guarding, no CVA tenderness, no tenderness at McBurney's point and negative Murphy's sign.  Peg Tube noted  Musculoskeletal:  BLE pitting edema +2 to proximal tibia. LUE pitting edema +2. NVI. Motor/senastion intact. Distal pulses appreciated.   Neurological: She is alert and oriented to person, place, and time.  Skin: Skin is warm and dry.  Chronic skin changes from Dermatomyositis. Healed decubitus ulcer. Non erythematous. No purulence. No induration.   Psychiatric: She has a normal mood and affect. Her speech is normal and behavior is normal. Thought content normal.  Nursing note and vitals reviewed.  ED Treatments / Results  Labs (all labs ordered are listed, but only abnormal results are displayed) Labs  Reviewed  COMPREHENSIVE METABOLIC PANEL - Abnormal; Notable for the following:       Result Value   BUN 62 (*)    Calcium 8.2 (*)  Total Protein 4.2 (*)    Albumin 2.0 (*)    AST 47 (*)    All other components within normal limits  CBC - Abnormal; Notable for the following:    WBC 13.4 (*)    RBC 2.92 (*)    Hemoglobin 8.4 (*)    HCT 27.2 (*)    RDW 20.1 (*)    Platelets 128 (*)    All other components within normal limits  I-STAT CG4 LACTIC ACID, ED - Abnormal; Notable for the following:    Lactic Acid, Venous 3.84 (*)    All other components within normal limits  CULTURE, BLOOD (ROUTINE X 2)  CULTURE, BLOOD (ROUTINE X 2)  URINE CULTURE  LIPASE, BLOOD  URINALYSIS, ROUTINE W REFLEX MICROSCOPIC (NOT AT [facility])  BRAIN NATRIURETIC PEPTIDE  TROPONIN I   EKG  EKG Interpretation  Date/Time:  Friday October 01 2015 11:15:31 EDT Ventricular Rate:  127 PR Interval:    QRS Duration: 98 QT Interval:  332 QTC Calculation: 483 R Axis:   65 Text Interpretation:  Sinus tachycardia Probable left atrial enlargement Anteroseptal infarct, age indeterminate Minimal ST elevation, inferior leads Baseline wander in lead(s) V1 Rate faster Confirmed by Wyvonnia Dusky  MD, STEPHEN [phone]) on 10/18/2015 11:20:22 AM      Radiology Dg Chest Portable 1 View  Result Date: 10/20/2015 CLINICAL DATA:  Abdominal pain.  Nausea.  Fever .  Lung cancer. EXAM: PORTABLE CHEST 1 VIEW COMPARISON:  09/28/2015.  CT 08/27/2015. FINDINGS: PowerPort catheter in stable position. Heart size stable. Worsening infiltrate in the the right upper lobe. Underlying mass cannot be excluded. Mild left base infiltrate noted. Small left pleural effusion. No pneumothorax . IMPRESSION: 1. PowerPort catheter noted in good anatomic position. 2. Progress infiltrate right upper lobe with possible underlying mass in this patient with known history of lung cancer. 3. Mild left base infiltrate consistent pneumonia. Small left pleural effusion. 4.  Stable mild cardiomegaly. Electronically Signed   By: Marcello Moores  Register   On: 10/17/2015 11:54   Procedures Procedures (including critical care time)  Medications Ordered in ED Medications  ceFEPIme (MAXIPIME) 1 g in dextrose 5 % 50 mL IVPB (1 g Intravenous New Bag/Given 09/24/2015 1259)  vancomycin (VANCOCIN) IVPB 1000 mg/200 mL premix (1,000 mg Intravenous New Bag/Given 10/02/2015 1304)  vancomycin (VANCOCIN) 500 mg in sodium chloride 0.9 % 100 mL IVPB (not administered)  sodium chloride 0.9 % bolus 1,000 mL (not administered)  sodium chloride 0.9 % bolus 1,000 mL (1,000 mLs Intravenous New Bag/Given 10/03/2015 1250)   Initial Impression / Assessment and Plan / ED Course  I have reviewed the triage vital signs and the nursing notes.  Pertinent labs & imaging results that were available during my care of the patient were reviewed by me and considered in my medical decision making (see chart for details).  Clinical Course    Final Clinical Impressions(s) / ED Diagnoses  I have reviewed and evaluated the relevant laboratory valuesI have reviewed and evaluated the relevant imaging studies. I have interpreted the relevant EKG.I have reviewed the relevant previous healthcare records.I have reviewed EMS Documentation.I obtained HPI from historian. Patient discussed with supervising physician  ED Course:  Assessment: Pt is a 60yF with hx  Anemia, Lung Cancer, Dermatomyostis on prednisone, Diastolic Dysfunction who presents with N/V, abdominal pain as well as CP/SOB since DC from hospital yesterday due to anemia from duodenal ulcer. Admitted on 09-28-15. EGD done 09-29-15. H. Pylori biopsy negative. Pt with possible HCAP and treated  with ABX, but pt unsure if taking. Blood cultures from previous admission negative. On exam, pt in NAD. VS with tachycardia and hypotensive initially with 03X sytolic. Rectal Temp 101.37F. Lungs bilateral rales. Abdomen nontender soft. iStat Lactate 3.84. CBC with leukocytosis 13.4 and  improving Hgb to 8.4 from 7.2 yesterday. CMP unremarkable. Lipase unremarkable. UA pending. Trop pending, but likely will be elevated as previous admission notes being elevated as well as x40monthago. EKG with tachycardia and no acute abnormalities. CXR showed PNA, given Cefepime and Vanc. Code sepsis initiated. Made NPO. Given 2L NS bolus in ED, pt lactate <4 and BP >>28systolic. Pt with pitting edema and hx diastolic dysfunction and on Lasix and will withhold more fluids. Repeat sepsis assessment completed. Admit to medicine.   Disposition/Plan:  Admit to Medicine Pt acknowledges and agrees with plan  Supervising Physician SEzequiel Essex MD   Final diagnoses:  HCAP (healthcare-associated pneumonia)    New Prescriptions New Prescriptions   No medications on file       TShary Decamp PA-C 10/12/2015 1[place] MD 10/02/15 0900

## 2015-10-01 NOTE — Progress Notes (Signed)
Critical lab called for troponin of 0.10 and lactic acid 4.8. On call NP made aware. No new orders received. Will continue to monitor.

## 2015-10-01 NOTE — ED Notes (Signed)
Changed pt sheets and repositioned pt after helping her on bedpan.  Pt refuses BP cuff in place at this time.  SIgnificant edema, most in left arm.  Await bed assignment

## 2015-10-01 NOTE — ED Notes (Signed)
Doctor at bedside.

## 2015-10-01 NOTE — ED Notes (Signed)
Pt appears asleep, laying on her side.  Family members are at the bedside.  Spoke with admitting about IV fluids and POC discussed with family.  Await bed assignment

## 2015-10-01 NOTE — Progress Notes (Signed)
Pt arrived from the ED. Pt very confused, family states this is not normal for patient. Pt situated, bed alarm on.

## 2015-10-01 NOTE — ED Notes (Signed)
EKG completed at 1115

## 2015-10-01 NOTE — H&P (Signed)
History and Physical    Megan Sims [id number] DOB: 12-12-1955 DOA: 10/20/2015   PCP: Jani Gravel, MD   Patient coming from:  Home  Chief Complaint: Shortness of breath and cough  HPI: Megan Sims is a 60 y.o. female with medical history significant for Lung cancer, not currently on treatment, history of dermatomyositis, CHF,and a recent admission for GI bleed due to duodenal ulcer on PPI (H pylori by US biopsy pending) from 9/5 through 09/30/2015,at which time, she was diagnosed with HCAP, sent home with Levaquin, failing to take the medicine, now presenting with worsening respiratory symptoms.she reports increasing shortness of breath, increased mucus production in the setting of chronic hemoptysis, as well as generalized weakness. The patient denies fevers, chills or diaphoresis. She denies any headaches. She reports chest pain with cough, but no cardiac complaints. She denies any nausea vomiting or diarrhea. She denies any dysuria or gross hematuria.  ED Course:  BP 110/66   Temp 101.5 F (38.6 C) (Rectal)   Resp 20   Ht '5\' 1"'$  (1.549 m)   Wt 57.6 kg (127 lb)   SpO2 94%   BMI 24.00 kg/m    white count 13.4 hemoglobin 8.4 platelets 128 lactic acid 3.84 Blood culture pending EKG with sinus tachycardia, no new acute coronary changes Received IV vancomycin and Maxipime, 2 L of IV fluids.  Review of Systems: As per HPI otherwise 10 point review of systems negative.   Past Medical History:  Diagnosis Date  . Anemia 08/03/2015  . Cancer [facility])    malignant neoplasm of unknown origin  . Cerebral aneurysm 2 brain surgeries 96 or 97  . Chronic neck pain 09/24/2014  . Diarrhea   . Emphysema of lung ([place]) 05/05/2015  . Hypertension   . Hyponatremia   . Left knee pain   . Left leg weakness 06/2015  . Lung abnormality   . Lung cancer ([place]) 10/07/2014  . Malignant neoplasm of unknown origin ([place])   . Migraine   . Nicotine dependence 09/24/2014  . Oral thrush 09/24/2014  . Paget disease of  bone   . Pleurisy   . Renal insufficiency    Patient states " no kidney problems  . S/P biopsy    of throat per patient.  . Skin ulcer ([place]) 11/18/2014  . 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 CEREBRAL ANEURYSM REPAIR  96 or 97  . CHEST TUBE INSERTION    . COLONOSCOPY     10 years ago in [place]   . ESOPHAGOGASTRODUODENOSCOPY  06/16/2015   Dr. Gala Romney: edentulous cricopharyngeus, esophageal plaques, biopsy consistent with candida  . ESOPHAGOGASTRODUODENOSCOPY N/A 06/16/2015   Procedure: ESOPHAGOGASTRODUODENOSCOPY (EGD);  Surgeon: Daneil Dolin, MD;  [facility];  Service: Endoscopy;  Laterality: N/A;  . ESOPHAGOGASTRODUODENOSCOPY N/A 07/06/2015   Procedure: ESOPHAGOGASTRODUODENOSCOPY (EGD);  Surgeon: Danie Binder, MD;  Location: [facility];  Service: Endoscopy;  Laterality: N/A;  . ESOPHAGOGASTRODUODENOSCOPY N/A 09/29/2015   Procedure: ESOPHAGOGASTRODUODENOSCOPY (EGD);  Surgeon: Irene Shipper, MD;  Location: [facility];  Service: Endoscopy;  Laterality: N/A;  . INTRAMEDULLARY (IM) NAIL INTERTROCHANTERIC Left 08/07/2015   Procedure: INTRAMEDULLARY (IM) NAIL INTERTROCHANTRIC;  Surgeon: Melrose Nakayama, MD;  Location: [facility];  Service: Orthopedics;  Laterality: Left;  . LARYNGOSCOPY  Jun 22, 2015   [facility], Dr. Erik Obey: normal oropharynx, no lesions, mobile vocal cords with good airway.  . PEG PLACEMENT N/A 07/06/2015   Procedure: PERCUTANEOUS ENDOSCOPIC  GASTROSTOMY (PEG) PLACEMENT;  Surgeon: Danie Binder, MD;  Location: [facility];  Service: Endoscopy;  Laterality: N/A;  . TUBAL LIGATION      Social History Social History   Social History  . Marital status: Married    Spouse name: N/A  . Number of children: N/A  . Years of education: N/A   Occupational History  . Not on file.   Social History Main Topics  . Smoking status: Current Every Day Smoker    Packs/day: 0.50    Years: 34.00    Types:  Cigarettes  . Smokeless tobacco: Never Used     Comment: HOWEVER, will smoke 1 cigarette if "tragic" event (death), or if she needs it for her nerves   . Alcohol use No  . Drug use: No  . Sexual activity: Not on file   Other Topics Concern  . Not on file   Social History Narrative  . No narrative on file     Allergies  Allergen Reactions  . Hydrocodone-Acetaminophen Swelling    Increases secretions and sensation of throat swelling  . Demeclocycline Other (See Comments)    PILLESOPHAGITIS  . Tetracyclines & Related     PILLESOPHAGITIS  . Tramadol     SWELLING    Family History  Problem Relation Age of Onset  . Hypertension    . Diabetes    . Kidney disease    . Cancer Mother     throat ca  . Cancer Maternal Grandmother     thyroid ca  . Colon cancer Neg Hx       Prior to Admission medications   Medication Sig Start Date End Date Taking? Authorizing Provider  albuterol (PROVENTIL HFA;VENTOLIN HFA) 108 (90 Base) MCG/ACT inhaler Inhale 1-2 puffs into the lungs every 6 (six) hours as needed for wheezing or shortness of breath.    Historical Provider, MD  Amino Acids-Protein Hydrolys (FEEDING SUPPLEMENT, PRO-STAT SUGAR FREE 64,) LIQD Place 30 mLs into feeding tube daily. 09/30/15   Geradine Girt, DO  amLODipine (NORVASC) 10 MG tablet Take 1 tablet (10 mg total) by mouth daily. 08/30/15   Heath Lark, MD  fentaNYL (DURAGESIC - DOSED MCG/HR) 25 MCG/HR patch Place 1 patch (25 mcg total) onto the skin every 3 (three) days. 09/21/15   Heath Lark, MD  furosemide (LASIX) 20 MG tablet Place 20 mg into feeding tube daily.    Historical Provider, MD  hydrocortisone 2.5 % ointment Apply 1 application topically 2 (two) times daily. To face 09/20/15   Historical Provider, MD  levofloxacin (LEVAQUIN) 750 MG tablet Place 1 tablet (750 mg total) into feeding tube daily. 09/30/15   Geradine Girt, DO  metoprolol tartrate (LOPRESSOR) 25 MG tablet Take 0.5 tablets (12.5 mg total) by mouth 2 (two)  times daily. 09/10/15   Donne Hazel, MD  morphine (ROXANOL) 20 MG/ML concentrated solution Place 1 mL (20 mg total) into feeding tube every 2 (two) hours as needed for severe pain. 09/21/15   Heath Lark, MD  Nutritional Supplements (FEEDING SUPPLEMENT, JEVITY 1.2 CAL,) LIQD Place 392 mLs into feeding tube every 3 (three) hours. 09/30/15   Geradine Girt, DO  nystatin (MYCOSTATIN) 100000 UNIT/ML suspension Take 5 mLs by mouth 4 (four) times daily. Swish and spit for thrush 09/20/15   Historical Provider, MD  pantoprazole sodium (PROTONIX) 40 mg/20 mL PACK Place 20 mLs (40 mg total) into feeding tube 2 (two) times daily. 09/30/15   Geradine Girt, DO  predniSONE (DELTASONE) 5 MG tablet Take by mouth once daily as directed: 15 mg daily for 3 days the 10 mg daily. Patient taking differently: Take 10 mg by mouth daily with breakfast.  09/24/15   Heath Lark, MD  triamcinolone ointment (KENALOG) 0.1 % Apply 1 application topically 2 (two) times daily. From neck down. Do not use on face.. 09/20/15   Historical Provider, MD  Water For Irrigation, Sterile (FREE WATER) SOLN Place 100 mLs into feeding tube every 6 (six) hours. 09/30/15   Geradine Girt, DO    Physical Exam:    Vitals:   10/22/2015 1250 10/03/2015 1310 10/03/2015 1325 10/15/2015 1330  BP: 93/71  97/85 110/66  Resp: '22  19 20  '$ Temp:      TempSrc:      SpO2:   94%   Weight:  57.6 kg (127 lb)    Height:  '5\' 1"'$  (1.549 m)         Constitutional: Moderate degree of discomfort, ill appearing, anxious.  Vitals:   09/28/2015 1250 09/29/2015 1310 09/25/2015 1325 10/17/2015 1330  BP: 93/71  97/85 110/66  Resp: '22  19 20  '$ Temp:      TempSrc:      SpO2:   94%   Weight:  57.6 kg (127 lb)    Height:  '5\' 1"'$  (1.549 m)     Eyes: PERRL, lids and conjunctivae normal ENMT: Mucous membranes are moist. Posterior pharynx dry with resolving white coating , no open lesionslesions.Normal dentition.  Neck: normal, supple, no masses, no thyromegaly Respiratory: clear to  auscultation bilaterally, no wheezing, bibasilar rales, r>L , no rhonchi or wheezing, decreased breath sounds on the right. Slightly increased respiratory effort. No accessory muscle use.  Cardiovascular:tachycardic, no murmurs / rubs / gallops.bliateral 1-2+ L>R lowerextremity edema.  LUE edema 2+ pedal pulses. No carotid bruits. Left port non tender Abdomen: PeG tube noted, non tender , no masses palpated. No hepatosplenomegaly. Bowel sounds positive.  Musculoskeletal: no clubbing / cyanosis. No joint deformity upper and lower extremities. Good ROM, no contractures. Normal muscle tone.  Skin:chronic changes from dermatomyosytis  Neurologic: CN 2-12 grossly intact. Sensation intact, DTR normal. Strength 5/5 in all 4.  Psychiatric: Normal judgment and insight. Alert and oriented x 3. Normal mood.     Labs on Admission: I have personally reviewed following labs and imaging studies  CBC:  Recent Labs Lab 09/28/15 1835 09/29/15 0415 09/30/15 0511 09/30/15 1220 10/17/2015 1235  WBC 14.8* 11.8* 13.4*  --  13.4*  NEUTROABS 13.2*  --   --   --   --   HGB 4.1* 7.2* 7.2* 8.7* 8.4*  HCT 13.0* 21.2* 21.5* 26.2* 27.2*  MCV 94.2 89.8 91.1  --  93.2  PLT 197 139* 183  --  128*    Basic Metabolic Panel:  Recent Labs Lab 09/28/15 1835 09/29/15 0415 09/30/15 0511 09/28/2015 1235  NA 135 137 141 142  K 4.5 4.1 4.4 3.5  CL 98* 103 105 108  CO2 '26 28 28 24  '$ GLUCOSE 145* 147* 187* 99  BUN 82* 67* 53* 62*  CREATININE 0.97 0.86 0.83 0.85  CALCIUM 8.2* 7.8* 6.7* 8.2*    GFR: Estimated Creatinine Clearance: 57.4 mL/min (by C-G formula based on SCr of 0.85 mg/dL).  Liver Function Tests:  Recent Labs Lab 09/28/15 1835 09/29/15 0415 10/10/2015 1235  AST 50* 42* 47*  ALT '14 14 14  '$ ALKPHOS 49 42 48  BILITOT 0.2* 0.5 0.8  PROT 5.3* 4.7*  4.2*  ALBUMIN 2.8* 2.4* 2.0*    Recent Labs Lab 10/03/2015 1235  LIPASE 17   No results for input(s): AMMONIA in the last 168 hours.  Coagulation  Profile:  Recent Labs Lab 09/28/15 1831  INR 1.07    Cardiac Enzymes:  Recent Labs Lab 09/29/15 0700  TROPONINI 0.04*    BNP (last 3 results) No results for input(s): PROBNP in the last 8760 hours.  HbA1C: No results for input(s): HGBA1C in the last 72 hours.  CBG:  Recent Labs Lab 09/29/15 2031 09/29/15 2333 09/30/15 0423 09/30/15 0741 09/30/15 1127  GLUCAP 149* 163* 163* 159* 184*    Lipid Profile: No results for input(s): CHOL, HDL, LDLCALC, TRIG, CHOLHDL, LDLDIRECT in the last 72 hours.  Thyroid Function Tests: No results for input(s): TSH, T4TOTAL, FREET4, T3FREE, THYROIDAB in the last 72 hours.  Anemia Panel:  Recent Labs  09/30/15 0511  FERRITIN 730*  TIBC 231*  IRON 39    Urine analysis:    Component Value Date/Time   COLORURINE YELLOW 09/28/2015 1959   APPEARANCEUR CLEAR 09/28/2015 1959   LABSPEC 1.014 09/28/2015 1959   PHURINE 5.5 09/28/2015 1959   GLUCOSEU NEGATIVE 09/28/2015 1959   HGBUR SMALL (A) 09/28/2015 1959   BILIRUBINUR NEGATIVE 09/28/2015 1959   KETONESUR NEGATIVE 09/28/2015 1959   PROTEINUR NEGATIVE 09/28/2015 1959   UROBILINOGEN 0.2 10/25/2014 0021   NITRITE NEGATIVE 09/28/2015 1959   LEUKOCYTESUR NEGATIVE 09/28/2015 1959    Sepsis Labs: '@LABRCNTIP'$ (procalcitonin:4,lacticidven:4) ) Recent Results (from the past 240 hour(s))  Culture, blood (Routine x 2)     Status: None (Preliminary result)   Collection Time: 09/28/15  6:31 PM  Result Value Ref Range Status   Specimen Description BLOOD LEFT ARM  Final   Special Requests IN PEDIATRIC BOTTLE 3 CC  Final   Culture   Final    NO GROWTH 3 DAYS Performed at [facility]    Report Status PENDING  Incomplete  Culture, blood (Routine x 2)     Status: None (Preliminary result)   Collection Time: 09/28/15  6:35 PM  Result Value Ref Range Status   Specimen Description BLOOD RIGHT ARM  Final   Special Requests BOTTLES DRAWN AEROBIC AND ANAEROBIC 5 CC [facility]  Final    Culture   Final    NO GROWTH 3 DAYS Performed at [facility]    Report Status PENDING  Incomplete  Urine culture     Status: Abnormal   Collection Time: 09/28/15  7:58 PM  Result Value Ref Range Status   Specimen Description URINE, RANDOM  Final   Special Requests NONE  Final   Culture 40,000 COLONIES/mL ENTEROCOCCUS FAECALIS (A)  Final   Report Status 09/30/2015 FINAL  Final   Organism ID, Bacteria ENTEROCOCCUS FAECALIS (A)  Final      Susceptibility   Enterococcus faecalis - MIC*    AMPICILLIN <=2 SENSITIVE Sensitive     LEVOFLOXACIN 1 SENSITIVE Sensitive     NITROFURANTOIN <=16 SENSITIVE Sensitive     VANCOMYCIN 1 SENSITIVE Sensitive     * 40,000 COLONIES/mL ENTEROCOCCUS FAECALIS  MRSA PCR Screening     Status: None   Collection Time: 09/28/15  9:08 PM  Result Value Ref Range Status   MRSA by PCR NEGATIVE NEGATIVE Final    Comment:        The GeneXpert MRSA Assay (FDA approved for NASAL specimens only), is one component of a comprehensive MRSA colonization surveillance program. It is not intended to  diagnose MRSA infection nor to guide or monitor treatment for MRSA infections.   Blood culture (routine x 2)     Status: None (Preliminary result)   Collection Time: 10/23/2015  1:00 PM  Result Value Ref Range Status   Specimen Description BLOOD RIGHT HAND  Final   Special Requests IN PEDIATRIC BOTTLE  1CC  Final   Culture PENDING  Incomplete   Report Status PENDING  Incomplete     Radiological Exams on Admission: Dg Chest Portable 1 View  Result Date: 10/18/2015 CLINICAL DATA:  Abdominal pain.  Nausea.  Fever .  Lung cancer. EXAM: PORTABLE CHEST 1 VIEW COMPARISON:  09/28/2015.  CT 08/27/2015. FINDINGS: PowerPort catheter in stable position. Heart size stable. Worsening infiltrate in the the right upper lobe. Underlying mass cannot be excluded. Mild left base infiltrate noted. Small left pleural effusion. No pneumothorax . IMPRESSION: 1. PowerPort catheter noted in  good anatomic position. 2. Progress infiltrate right upper lobe with possible underlying mass in this patient with known history of lung cancer. 3. Mild left base infiltrate consistent pneumonia. Small left pleural effusion. 4. Stable mild cardiomegaly. Electronically Signed   By: Marcello Moores  Register   On: 09/30/2015 11:54    EKG: Independently reviewed.  Assessment/Plan Active Problems:   Dysphagia   Essential hypertension   Bilateral lung cancer (HCC)   Paget's disease of bone   Oral thrush   Nicotine dependence   Chronic neck pain   Cancer of upper lobe of right lung (HCC)   Sepsis due to pneumonia ([place])   Anemia due to other cause   Dehydration   Deficiency anemia   HCAP (healthcare-associated pneumonia)   Duodenal ulcer hemorrhagic   Healthcare associated pneumonia, worsening. Patient  dc'd on Levaquin on 9/7 but did not take.   Chest x-ray  mild left base infiltrate consistent pneumonia. Small left pleural effusion. Known RUL mass.  lactic acid 3.84. WBC 13.4 (infectious, reactive in the setting pf chronic prednisone)   maximum temperature 101.5  Oxygen saturation level 90s on room air.. BP 110/66   Temp 101.5 F (38.6 C) (Rectal)   Resp 20  white count 13.4  lactic acid 3.84  Blood culture pending. Received IV vancomycin and Maxipime, 2 L of IV fluids. Admit to stepdown  HCAP order set   -Antibiotics per protocol with Levaquin, Cefepime  -Blood cultures Procalcitonin -Strep pneumo urine antigen -Legionella urine antigen -Oxygen supplementation and wean as able -Nebulizers every 6 hours, q 4 prn   Hypertension BP 110/66   Temp 101.5 F (38.6 C) (Rectal)   Resp 20   Ht '5\' 1"'$  (1.549 m)   Wt 57.6 kg (127 lb)   SpO2 94%   BMI 24.00 kg/m  Hold  home anti-hypertensive medications for now  May resume when hemodynamically stable  Anemia of chronic disease Hemoglobin  on admission. 8.4  -Transfuse 1 unit packed red blood cells if Hb less than 8 or acutely bleeding Repeat  CBC in am   Thrombocytopenia Likely due to malignancy, dilution, infection,  recentacute blood loss  No transfusion is indicated at this time Monitor counts closely Transfuse 1 unit of platelets if count is less or equal than 10,000 or 20,000 if the patient is acutely bleeding Hold blood thiners at this time     History of Gastro Intestinal Bleed due to duodenal ulcer. H Pilori neg. Hb 8.4 Continue PPI  IVF Hold BP meds  Chronic  diastolic CHF.  Last EF 10/2014 EF 55 % w  Grade 1 DD  Continue meds  -monitor I/Os -daily weights -prn 02  History of dermatomyositis, on prednisone. Hold for now   Lung cancer, stage 3 B, not currently on treatment , followed by Dr. Alvy Bimler at the [facility]  History of dysphagia, on G tube feeds,   DVT prophylaxis:   SCD's  Code Status:   Full    Family Communication:  Discussed with patient Disposition Plan: Expect patient to be discharged to home after condition improves Consults called:    None Admission status:  Inpatient stepdown    [facility] E, PA-C [facility]   If 7PM-7AM, please contact night-coverage [email or web address] Password [email or web address]  09/27/2015, 2:24 PM

## 2015-10-01 NOTE — ED Triage Notes (Signed)
Pt here from home with c/o abd pain and nausea since last night, pt also has stage lung cancer, stage 3 decub ulcer, fevers, extremity swelling. Pt a/o x 4. Pt has think m,ucous that she cannot clear on her own since last night. Pt had recent dx of pneumonia.

## 2015-10-01 NOTE — Progress Notes (Addendum)
Pharmacy Antibiotic Note  Megan Sims is a 60 y.o. female admitted on 10/15/2015 with abdominal pain and nausea. Per provider note on admission, has recent diagnosis of PNA. Pharmacy has been consulted for vancomycin dosing. Temp 101.5, WBC 13.4.   Plan: Vancomycin 1g x1 in the ED, then 500 mg IV every 12 hours Goal vancomycin trough 15-20 mcg/mL  Cefepime 1g x1 in ED - dosing per provider  Monitor renal function, cultures, clinical status, and vancomycin trough as needed Follow-up antibiotic length of therapy   Temp (24hrs), Avg:100.1 F (37.8 C), Min:98.6 F (37 C), Max:101.5 F (38.6 C)   Recent Labs Lab 09/28/15 1834 09/28/15 1835 09/29/15 0415 09/29/15 0700 09/30/15 0511  WBC  --  14.8* 11.8*  --  13.4*  CREATININE  --  0.97 0.86  --  0.83  LATICACIDVEN 4.83*  --   --  1.2  --     Estimated Creatinine Clearance: 58.9 mL/min (by C-G formula based on SCr of 0.83 mg/dL).    Allergies  Allergen Reactions  . Hydrocodone-Acetaminophen Swelling    Increases secretions and sensation of throat swelling  . Demeclocycline Other (See Comments)    PILLESOPHAGITIS  . Tetracyclines & Related     PILLESOPHAGITIS  . Tramadol     SWELLING    Antimicrobials this admission: 9/8 cefepime x1 in ED  9/8 vanc  >>   Dose adjustments this admission: N/A  Microbiology results: 9/8 BCx: not yet collected   Thank you for allowing pharmacy to be a part of this patient's care.  Argie Ramming, PharmD Pharmacy Resident  Pager [phone] 10/10/2015 12:32 PM  Addendum: Continuing cefepime per pharmacy.   Plan: Cefepime 1gm IV Q8H F/u renal fxn, C&S, clinical status   Salome Arnt, PharmD, BCPS Pager # [phone] 10/15/2015 2:41 PM

## 2015-10-02 ENCOUNTER — Inpatient Hospital Stay (HOSPITAL_COMMUNITY): Payer: Medicaid Other

## 2015-10-02 DIAGNOSIS — A419 Sepsis, unspecified organism: Principal | ICD-10-CM

## 2015-10-02 DIAGNOSIS — R571 Hypovolemic shock: Secondary | ICD-10-CM

## 2015-10-02 DIAGNOSIS — J189 Pneumonia, unspecified organism: Secondary | ICD-10-CM

## 2015-10-02 DIAGNOSIS — M360 Dermato(poly)myositis in neoplastic disease: Secondary | ICD-10-CM

## 2015-10-02 DIAGNOSIS — R578 Other shock: Secondary | ICD-10-CM | POA: Diagnosis present

## 2015-10-02 DIAGNOSIS — D696 Thrombocytopenia, unspecified: Secondary | ICD-10-CM

## 2015-10-02 DIAGNOSIS — K264 Chronic or unspecified duodenal ulcer with hemorrhage: Secondary | ICD-10-CM

## 2015-10-02 DIAGNOSIS — D649 Anemia, unspecified: Secondary | ICD-10-CM

## 2015-10-02 LAB — GLUCOSE, CAPILLARY
GLUCOSE-CAPILLARY: 187 mg/dL — AB (ref 65–99)
GLUCOSE-CAPILLARY: 196 mg/dL — AB (ref 65–99)
Glucose-Capillary: 188 mg/dL — ABNORMAL HIGH (ref 65–99)
Glucose-Capillary: 222 mg/dL — ABNORMAL HIGH (ref 65–99)

## 2015-10-02 LAB — BLOOD GAS, ARTERIAL
ACID-BASE DEFICIT: 0.3 mmol/L (ref 0.0–2.0)
ACID-BASE EXCESS: 0.5 mmol/L (ref 0.0–2.0)
Acid-base deficit: 0.2 mmol/L (ref 0.0–2.0)
BICARBONATE: 23.4 mmol/L (ref 20.0–28.0)
BICARBONATE: 23.1 mmol/L (ref 20.0–28.0)
Bicarbonate: 22 mmol/L (ref 20.0–28.0)
DRAWN BY: 39899
Drawn by: 460981
Drawn by: 270221
FIO2: 21
FIO2: 0.7
FIO2: 40
LHR: 20 {breaths}/min
LHR: 15 {breaths}/min
MECHVT: 390 mL
MECHVT: 390 mL
O2 SAT: 99.6 %
O2 Saturation: 85.9 %
O2 Saturation: 98.4 %
PATIENT TEMPERATURE: 98.6
PCO2 ART: 25.3 mmHg — AB (ref 32.0–48.0)
PCO2 ART: 32.1 mmHg (ref 32.0–48.0)
PEEP/CPAP: 5 cmH2O
PEEP: 5 cmH2O
PH ART: 7.549 — AB (ref 7.350–7.450)
PH ART: 7.506 — AB (ref 7.350–7.450)
PO2 ART: 47.5 mmHg — AB (ref 83.0–108.0)
PO2 ART: 96.7 mmHg (ref 83.0–108.0)
Patient temperature: 98.6
Patient temperature: 98.6
pCO2 arterial: 29.8 mmHg — ABNORMAL LOW (ref 32.0–48.0)
pH, Arterial: 7.471 — ABNORMAL HIGH (ref 7.350–7.450)
pO2, Arterial: 317 mmHg — ABNORMAL HIGH (ref 83.0–108.0)

## 2015-10-02 LAB — URINALYSIS, ROUTINE W REFLEX MICROSCOPIC
BILIRUBIN URINE: NEGATIVE
GLUCOSE, UA: 100 mg/dL — AB
KETONES UR: NEGATIVE mg/dL
LEUKOCYTES UA: NEGATIVE
NITRITE: NEGATIVE
PROTEIN: 30 mg/dL — AB
Specific Gravity, Urine: 1.017 (ref 1.005–1.030)
pH: 6 (ref 5.0–8.0)

## 2015-10-02 LAB — COMPREHENSIVE METABOLIC PANEL
ALBUMIN: 1.4 g/dL — AB (ref 3.5–5.0)
ALK PHOS: 32 U/L — AB (ref 38–126)
ALT: 13 U/L — AB (ref 14–54)
AST: 48 U/L — AB (ref 15–41)
Anion gap: 15 (ref 5–15)
BILIRUBIN TOTAL: 0.5 mg/dL (ref 0.3–1.2)
BUN: 56 mg/dL — AB (ref 6–20)
CO2: 17 mmol/L — ABNORMAL LOW (ref 22–32)
CREATININE: 0.75 mg/dL (ref 0.44–1.00)
Calcium: 7.3 mg/dL — ABNORMAL LOW (ref 8.9–10.3)
Chloride: 116 mmol/L — ABNORMAL HIGH (ref 101–111)
GFR calc Af Amer: >60 mL/min (ref >60)
Glucose, Bld: 123 mg/dL — ABNORMAL HIGH (ref 65–99)
POTASSIUM: 3.5 mmol/L (ref 3.5–5.1)
Sodium: 148 mmol/L — ABNORMAL HIGH (ref 135–145)
TOTAL PROTEIN: 3.2 g/dL — AB (ref 6.5–8.1)

## 2015-10-02 LAB — CBC
HCT: 10.3 % — ABNORMAL LOW (ref 36.0–46.0)
HCT: 20 % — ABNORMAL LOW (ref 36.0–46.0)
HEMATOCRIT: 11.8 % — AB (ref 36.0–46.0)
HEMATOCRIT: 23.8 % — AB (ref 36.0–46.0)
HEMOGLOBIN: 7.8 g/dL — AB (ref 12.0–15.0)
Hemoglobin: 3.5 g/dL — CL (ref 12.0–15.0)
Hemoglobin: 3.2 g/dL — CL (ref 12.0–15.0)
Hemoglobin: 6.8 g/dL — CL (ref 12.0–15.0)
MCH: 28.7 pg (ref 26.0–34.0)
MCH: 29.9 pg (ref 26.0–34.0)
MCH: 28.8 pg (ref 26.0–34.0)
MCH: 28.9 pg (ref 26.0–34.0)
MCHC: 29.7 g/dL — ABNORMAL LOW (ref 30.0–36.0)
MCHC: 31.1 g/dL (ref 30.0–36.0)
MCHC: 32.8 g/dL (ref 30.0–36.0)
MCHC: 34 g/dL (ref 30.0–36.0)
MCV: 96.7 fL (ref 78.0–100.0)
MCV: 96.3 fL (ref 78.0–100.0)
MCV: 87.8 fL (ref 78.0–100.0)
MCV: 85.1 fL (ref 78.0–100.0)
PLATELETS: 70 10*3/uL — AB (ref 150–400)
PLATELETS: 60 10*3/uL — AB (ref 150–400)
Platelets: UNDETERMINED 10*3/uL (ref 150–400)
Platelets: 67 10*3/uL — ABNORMAL LOW (ref 150–400)
RBC: 1.22 MIL/uL — AB (ref 3.87–5.11)
RBC: 1.07 MIL/uL — ABNORMAL LOW (ref 3.87–5.11)
RBC: 2.71 MIL/uL — ABNORMAL LOW (ref 3.87–5.11)
RBC: 2.35 MIL/uL — AB (ref 3.87–5.11)
RDW: 20.9 % — AB (ref 11.5–15.5)
RDW: 21.2 % — ABNORMAL HIGH (ref 11.5–15.5)
RDW: 17.1 % — AB (ref 11.5–15.5)
RDW: 17.8 % — ABNORMAL HIGH (ref 11.5–15.5)
WBC: 12.2 10*3/uL — AB (ref 4.0–10.5)
WBC: 18.7 10*3/uL — ABNORMAL HIGH (ref 4.0–10.5)
WBC: 14 10*3/uL — ABNORMAL HIGH (ref 4.0–10.5)
WBC: 13.6 10*3/uL — ABNORMAL HIGH (ref 4.0–10.5)

## 2015-10-02 LAB — PREPARE RBC (CROSSMATCH)

## 2015-10-02 LAB — URINE MICROSCOPIC-ADD ON

## 2015-10-02 LAB — LACTIC ACID, PLASMA
Lactic Acid, Venous: 10.3 mmol/L (ref 0.5–1.9)
Lactic Acid, Venous: 2.6 mmol/L (ref 0.5–1.9)

## 2015-10-02 LAB — ABO/RH: ABO/RH(D): O POS

## 2015-10-02 LAB — URINE CULTURE

## 2015-10-02 LAB — TROPONIN I: Troponin I: 0.36 ng/mL (ref <0.03)

## 2015-10-02 MED ORDER — MIDAZOLAM HCL 2 MG/2ML IJ SOLN
2.0000 mg | INTRAMUSCULAR | Status: DC | PRN
  Administered 2015-10-02 – 2015-10-06 (×8): 2 mg via INTRAVENOUS
  Filled 2015-10-02 (×7): qty 2

## 2015-10-02 MED ORDER — SODIUM CHLORIDE 0.9 % IV SOLN: Freq: Once | INTRAVENOUS | Status: DC

## 2015-10-02 MED ORDER — SODIUM CHLORIDE 0.9 % IV BOLUS (SEPSIS)
1000.0000 mL | Freq: Once | INTRAVENOUS | Status: AC
  Administered 2015-10-02: 1000 mL via INTRAVENOUS

## 2015-10-02 MED ORDER — SODIUM CHLORIDE 0.9 % IV SOLN: 250.0000 mL | INTRAVENOUS | Status: DC | PRN

## 2015-10-02 MED ORDER — SODIUM CHLORIDE 0.9 % IV SOLN: Freq: Once | INTRAVENOUS | Status: AC

## 2015-10-02 MED ORDER — CHLORHEXIDINE GLUCONATE 0.12 % MT SOLN
15.0000 mL | Freq: Two times a day (BID) | OROMUCOSAL | Status: DC
  Administered 2015-10-02 – 2015-10-09 (×14): 15 mL via OROMUCOSAL
  Filled 2015-10-02: qty 15

## 2015-10-02 MED ORDER — FENTANYL CITRATE (PF) 100 MCG/2ML IJ SOLN
100.0000 ug | INTRAMUSCULAR | Status: DC | PRN
  Administered 2015-10-02 – 2015-10-04 (×5): 100 ug via INTRAVENOUS
  Administered 2015-10-04 (×2): 50 ug via INTRAVENOUS
  Administered 2015-10-04 – 2015-10-09 (×22): 100 ug via INTRAVENOUS
  Administered 2015-10-09: 12.5 ug via INTRAVENOUS
  Administered 2015-10-09: 100 ug via INTRAVENOUS
  Filled 2015-10-02 (×32): qty 2

## 2015-10-02 MED ORDER — ORAL CARE MOUTH RINSE
15.0000 mL | Freq: Four times a day (QID) | OROMUCOSAL | Status: DC
  Administered 2015-10-02 – 2015-10-09 (×27): 15 mL via OROMUCOSAL

## 2015-10-02 MED ORDER — FENTANYL CITRATE (PF) 100 MCG/2ML IJ SOLN
100.0000 ug | INTRAMUSCULAR | Status: AC | PRN
  Administered 2015-10-03 (×3): 100 ug via INTRAVENOUS
  Filled 2015-10-02 (×2): qty 2

## 2015-10-02 MED ORDER — IPRATROPIUM-ALBUTEROL 0.5-2.5 (3) MG/3ML IN SOLN
RESPIRATORY_TRACT | Status: AC
  Filled 2015-10-02: qty 3

## 2015-10-02 MED ORDER — BUDESONIDE 0.5 MG/2ML IN SUSP
0.5000 mg | Freq: Two times a day (BID) | RESPIRATORY_TRACT | Status: DC
  Administered 2015-10-02 – 2015-10-16 (×29): 0.5 mg via RESPIRATORY_TRACT
  Filled 2015-10-02 (×31): qty 2

## 2015-10-02 MED ORDER — HYDROCORTISONE NA SUCCINATE PF 100 MG IJ SOLR
100.0000 mg | Freq: Four times a day (QID) | INTRAMUSCULAR | Status: DC
  Administered 2015-10-02 – 2015-10-05 (×12): 100 mg via INTRAVENOUS
  Filled 2015-10-02 (×14): qty 2

## 2015-10-02 MED ORDER — MIDAZOLAM HCL 2 MG/2ML IJ SOLN
2.0000 mg | INTRAMUSCULAR | Status: DC | PRN
  Filled 2015-10-02: qty 2

## 2015-10-02 MED ORDER — IPRATROPIUM-ALBUTEROL 0.5-2.5 (3) MG/3ML IN SOLN
3.0000 mL | Freq: Four times a day (QID) | RESPIRATORY_TRACT | Status: DC
  Administered 2015-10-02 – 2015-10-06 (×17): 3 mL via RESPIRATORY_TRACT
  Filled 2015-10-02 (×16): qty 3

## 2015-10-02 MED ORDER — PANTOPRAZOLE SODIUM 40 MG IV SOLR: 40.0000 mg | Freq: Once | INTRAVENOUS | Status: DC

## 2015-10-02 MED ORDER — SODIUM BICARBONATE 8.4 % IV SOLN
INTRAVENOUS | Status: AC
  Filled 2015-10-02: qty 100

## 2015-10-02 MED ORDER — NOREPINEPHRINE BITARTRATE 1 MG/ML IV SOLN
2.0000 ug/min | INTRAVENOUS | Status: DC
  Administered 2015-10-02: 10 ug/min via INTRAVENOUS
  Administered 2015-10-02: 14 ug/min via INTRAVENOUS
  Filled 2015-10-02 (×4): qty 4

## 2015-10-02 MED ORDER — DEXTROSE 5 % IV SOLN
0.0000 ug/min | INTRAVENOUS | Status: DC
  Filled 2015-10-02: qty 1

## 2015-10-02 MED ORDER — SODIUM BICARBONATE 8.4 % IV SOLN
100.0000 meq | INTRAVENOUS | Status: AC
  Administered 2015-10-02: 100 meq via INTRAVENOUS
  Filled 2015-10-02: qty 100

## 2015-10-02 MED ORDER — INSULIN ASPART 100 UNIT/ML ~~LOC~~ SOLN
0.0000 [IU] | SUBCUTANEOUS | Status: DC
  Administered 2015-10-02: 2 [IU] via SUBCUTANEOUS
  Administered 2015-10-02: 3 [IU] via SUBCUTANEOUS
  Administered 2015-10-02 – 2015-10-04 (×9): 2 [IU] via SUBCUTANEOUS
  Administered 2015-10-05 – 2015-10-08 (×5): 1 [IU] via SUBCUTANEOUS
  Administered 2015-10-08 (×2): 2 [IU] via SUBCUTANEOUS
  Administered 2015-10-08 – 2015-10-10 (×2): 1 [IU] via SUBCUTANEOUS
  Administered 2015-10-10: 2 [IU] via SUBCUTANEOUS
  Administered 2015-10-10: 1 [IU] via SUBCUTANEOUS
  Administered 2015-10-10 – 2015-10-11 (×6): 2 [IU] via SUBCUTANEOUS
  Administered 2015-10-11: 3 [IU] via SUBCUTANEOUS
  Administered 2015-10-11 – 2015-10-12 (×4): 2 [IU] via SUBCUTANEOUS

## 2015-10-02 MED ORDER — SODIUM CHLORIDE 0.9 % IV SOLN
8.0000 mg/h | INTRAVENOUS | Status: DC
  Administered 2015-10-02 – 2015-10-04 (×5): 8 mg/h via INTRAVENOUS
  Filled 2015-10-02 (×12): qty 80

## 2015-10-02 NOTE — Progress Notes (Addendum)
eLink Physician-Brief Progress Note Patient Name: Megan Sims DOB: 1955-09-19 MRN: [id number]   Date of Service  10/02/2015  HPI/Events of Note  Multiple issues: 1. Lactic Acid = 13.6 >> 2.6 (Lactic Acid is clearing)  and 2. Troponin = 0.36. GI bleeding, therefore, can't put on Heparin and/or ASA.  eICU Interventions  Will order: 1. 12 Lead EKG STAT.     Intervention Category Intermediate Interventions: Diagnostic test evaluation  Sommer,Steven Eugene 10/02/2015, 7:15 PM

## 2015-10-02 NOTE — Procedures (Signed)
Central Venous Catheter Insertion Procedure Note Megan Sims [id number] 12-10-1955  Procedure: Insertion of Central Venous Catheter Indications: Assessment of intravascular volume, Drug and/or fluid administration and Frequent blood sampling  Procedure Details Consent: Unable to obtain consent because of emergent medical necessity. Time Out: Verified patient identification, verified procedure, site/side was marked, verified correct patient position, special equipment/implants available, medications/allergies/relevent history reviewed, required imaging and test results available.  Performed  Maximum sterile technique was used including antiseptics, cap, gloves, gown, hand hygiene, mask and sheet. Skin prep: Chlorhexidine; local anesthetic administered A antimicrobial bonded/coated triple lumen catheter was placed in the right internal jugular vein using the Seldinger technique. Ultrasound guidance used.Yes.   Catheter placed to 17 cm. Blood aspirated via all 3 ports and then flushed x 3. Line sutured x 2 and dressing applied.  Evaluation Blood flow good Complications: No apparent complications Patient did tolerate procedure well. Chest X-ray ordered to verify placement.  CXR: pending.  Richardson Landry Aadam Zhen ACNP Maryanna Shape PCCM Pager [phone] till 3 pm If no answer page [phone] 10/02/2015, 9:12 AM

## 2015-10-02 NOTE — Progress Notes (Signed)
Initial Nutrition Assessment/Follow Up   DOCUMENTATION CODES:  Not applicable  INTERVENTION:  TF not indicated, pt likely expire to soon.   NUTRITION DIAGNOSIS:  Inadequate oral intake related to inability to eat as evidenced by NPO status.  GOAL:  Patient will meet greater than or equal to 90% of their needs  MONITOR:  Vent status, Labs, Weight trends, I & O's, TF tolerance  REASON FOR ASSESSMENT:  Home tube feeder + ventilator, intubated in midst of assessment  ASSESSMENT:  60 y/o female PMHx lung cancer, dermatomyositis, CHF, dysphagia (PEG dependant) , HTN. Has had several recent admissions in the past month. First for Sepsis 2/2 PNA and then Fatigue/GIB and now this admission for again sepsis 2/2 HCAP VS postobstructive PNA. Coded early on 11/01/22 and subsequently intubated, transferred to 42M.   Multitude of family seen outside room, very emotional.  Door closed for privacy.   Spoke with RN, pt likely to be transitioned to comfort Care.   No nutritional assessment conducted  Patient is currently intubated on ventilator support MV: N/A L/min Temp (24hrs), Avg:99.6 F (37.6 C), Min:98.1 F (36.7 C), Max:101.5 F (38.6 C)  Propofol: n/a   Pt has been continuously hospitalized this past month and she had interruptions in her TF. Likely most accurate dosing weight is from her first admission on 8/17. Will use.    Medications:  Labs:   Recent Labs Lab 09/30/15 0511 10/17/2015 1235 November 01, 2015 0545  NA 141 142 148*  K 4.4 3.5 3.5  CL 105 108 116*  CO2 28 24 17*  BUN 53* 62* 56*  CREATININE 0.83 0.85 0.75  CALCIUM 6.7* 8.2* 7.3*  GLUCOSE 187* 99 123*   Diet Order:  Diet NPO time specified  Skin:PU stage 2 to buttocks  Last BM:  11/01/22  Height:  Ht Readings from Last 1 Encounters:  10/11/2015 '5\' 1"'$  (1.549 m)   Weight:  Wt Readings from Last 1 Encounters:  10/21/2015 126 lb 3.2 oz (57.2 kg)   Wt Readings from Last 10 Encounters:  10/12/2015 126 lb 3.2 oz (57.2 kg)   09/30/15 127 lb 10.3 oz (57.9 kg)  09/21/15 124 lb (56.2 kg)  09/21/15 121 lb (54.9 kg)  09/15/15 117 lb (53.1 kg)  09/10/15 117 lb 8 oz (53.3 kg)  09/07/15 114 lb (51.7 kg)  09/02/15 114 lb 12.8 oz (52.1 kg)  08/27/15 120 lb (54.4 kg)  08/19/15 119 lb (54 kg)  Dosing weight: 117.13 lbs (53.29 kg)  Ideal Body Weight:  47.73 kg  BMI:  Body mass index is 23.85 kg/m.  Estimated Nutritional Needs:  Kcal: n/a Protein: n/a Fluid: n/a  EDUCATION NEEDS:  No education needs identified at this time  Burtis Junes RD, LDN, [place] [facility] Pager: [phone] 11-01-15 11:35 AM

## 2015-10-02 NOTE — Progress Notes (Signed)
Pt unresponsive this morning. Bps systolic 07E. Rapid Called, Code Called.

## 2015-10-02 NOTE — Progress Notes (Signed)
eLink Physician-Brief Progress Note Patient Name: Megan Sims DOB: [birth year] MRN: [id number]   Date of Service  10/02/2015  HPI/Events of Note  ABG on 70%/PRVC 20/TV 290/P 5 = 7.50/29/317/23.  eICU Interventions  Will order: 1. Decrease PRVC rate to 15.  2. ABG at 5 PM.     Intervention Category Major Interventions: Adrenal insufficiency - evaluation and management;Respiratory failure - evaluation and management  Sommer,Steven Cornelia Copa 10/02/2015, 3:51 PM

## 2015-10-02 NOTE — Consult Note (Addendum)
CROSS COVER FOR [facility] Reason for Consult: Melenic stools/rectal bleeding/hypotensive shock. Referring Physician: CCM.  Megan Sims is an 60 y.o. female.  HPI:  60 year old black female with multiple medical issues listed below, just moved to the ICU after she had a massive GI bleed on the floor and was found to be unresponsive. She had a recent EGD on 09/29/15 done by Dr. Scarlette Shorts when she was found to have a  duodenal ulcer without any stigmata of acute bleed. She was discharged on 09/07 and  was re-hospitalized on 10/09/2015 with high fevers, worsening respiratory symptoms, chest pain, cough, shortness of breath, with chronic hemoptysis and generalized weakness. I was unable to get any history from the patient as she was intubated; the history has been procured from the patient's husband and daughter and from review of her records. She was moved to the ICU from the floor and placed on pressure support for cardiac instability.   Past Medical History:  Diagnosis Date  . Anemia 08/03/2015  . Cancer [facility])    malignant neoplasm of unknown origin  . Cerebral aneurysm 2 brain surgeries 96 or 97  . Chronic neck pain 09/24/2014  . Diarrhea   . Emphysema of lung ([place]) 05/05/2015  . Hypertension   . Hyponatremia   . Left knee pain   . Left leg weakness 06/2015  . Lung abnormality   . Lung cancer ([place]) 10/07/2014  . Malignant neoplasm of unknown origin ([place])   . Migraine   . Nicotine dependence 09/24/2014  . Oral thrush 09/24/2014  . Paget disease of bone   . Pleurisy   . Renal insufficiency    Patient states " no kidney problems  . S/P biopsy    of throat per patient.  . Skin ulcer ([place]) 11/18/2014  . 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 CEREBRAL ANEURYSM REPAIR  96 or 97  . CHEST TUBE INSERTION    . COLONOSCOPY     10 years ago in [place]   . ESOPHAGOGASTRODUODENOSCOPY  06/16/2015   Dr. Gala Romney: edentulous cricopharyngeus,  esophageal plaques, biopsy consistent with candida  . ESOPHAGOGASTRODUODENOSCOPY N/A 06/16/2015   Procedure: ESOPHAGOGASTRODUODENOSCOPY (EGD);  Surgeon: Daneil Dolin, MD;  Location: [facility];  Service: Endoscopy;  Laterality: N/A;  . ESOPHAGOGASTRODUODENOSCOPY N/A 07/06/2015   Procedure: ESOPHAGOGASTRODUODENOSCOPY (EGD);  Surgeon: Danie Binder, MD;  Location: [facility];  Service: Endoscopy;  Laterality: N/A;  . ESOPHAGOGASTRODUODENOSCOPY N/A 09/29/2015   Procedure: ESOPHAGOGASTRODUODENOSCOPY (EGD);  Surgeon: Irene Shipper, MD;  Location: [facility];  Service: Endoscopy;  Laterality: N/A;  . INTRAMEDULLARY (IM) NAIL INTERTROCHANTERIC Left 08/07/2015   Procedure: INTRAMEDULLARY (IM) NAIL INTERTROCHANTRIC;  Surgeon: Melrose Nakayama, MD;  Location: [facility];  Service: Orthopedics;  Laterality: Left;  . LARYNGOSCOPY  Jun 22, 2015   [facility], Dr. Erik Obey: normal oropharynx, no lesions, mobile vocal cords with good airway.  . PEG PLACEMENT N/A 07/06/2015   Procedure: PERCUTANEOUS ENDOSCOPIC GASTROSTOMY (PEG) PLACEMENT;  Surgeon: Danie Binder, MD;  [facility]: [facility];  Service: Endoscopy;  Laterality: N/A;  . TUBAL LIGATION     Family History  Problem Relation Age of Onset  . Hypertension    . Diabetes    . Kidney disease    . Cancer Mother     throat ca  . Cancer Maternal Grandmother     thyroid ca  . Colon cancer Neg Hx    Social History:  reports  that she has been smoking Cigarettes.  She has a 17.00 pack-year smoking history. She has never used smokeless tobacco. She reports that she does not drink alcohol or use drugs.  Allergies:  Allergies  Allergen Reactions  . Hydrocodone-Acetaminophen Swelling    Increases secretions and sensation of throat swelling  . Demeclocycline Other (See Comments)    PILLESOPHAGITIS  . Tetracyclines & Related     PILLESOPHAGITIS  . Tramadol     SWELLING   Medications: I have reviewed the patient's current medications.  Results for orders  placed or performed during the hospital encounter of 10/05/2015 (from the past 48 hour(s))  Urinalysis, Routine w reflex microscopic     Status: Abnormal   Collection Time: 10/11/2015 11:22 AM  Result Value Ref Range   Color, Urine YELLOW YELLOW   APPearance CLOUDY (A) CLEAR   Specific Gravity, Urine 1.016 1.005 - 1.030   pH 6.0 5.0 - 8.0   Glucose, UA NEGATIVE NEGATIVE mg/dL   Hgb urine dipstick LARGE (A) NEGATIVE   Bilirubin Urine NEGATIVE NEGATIVE   Ketones, ur NEGATIVE NEGATIVE mg/dL   Protein, ur NEGATIVE NEGATIVE mg/dL   Nitrite NEGATIVE NEGATIVE   Leukocytes, UA SMALL (A) NEGATIVE  Strep pneumoniae urinary antigen     Status: None   Collection Time: 10/18/2015 11:22 AM  Result Value Ref Range   Strep Pneumo Urinary Antigen NEGATIVE NEGATIVE    Comment:        Infection due to S. pneumoniae cannot be absolutely ruled out since the antigen present may be below the detection limit of the test.   Urine microscopic-add on     Status: Abnormal   Collection Time: 10/08/2015 11:22 AM  Result Value Ref Range   Squamous Epithelial / LPF 0-5 (A) NONE SEEN   WBC, UA 0-5 0 - 5 WBC/hpf   RBC / HPF 0-5 0 - 5 RBC/hpf   Bacteria, UA FEW (A) NONE SEEN   Casts GRANULAR CAST (A) NEGATIVE  Lipase, blood     Status: None   Collection Time: 10/19/2015 12:35 PM  Result Value Ref Range   Lipase 17 11 - 51 U/L  Comprehensive metabolic panel     Status: Abnormal   Collection Time: 09/27/2015 12:35 PM  Result Value Ref Range   Sodium 142 135 - 145 mmol/L   Potassium 3.5 3.5 - 5.1 mmol/L   Chloride 108 101 - 111 mmol/L   CO2 24 22 - 32 mmol/L   Glucose, Bld 99 65 - 99 mg/dL   BUN 62 (H) 6 - 20 mg/dL   Creatinine, Ser 0.85 0.44 - 1.00 mg/dL   Calcium 8.2 (L) 8.9 - 10.3 mg/dL   Total Protein 4.2 (L) 6.5 - 8.1 g/dL   Albumin 2.0 (L) 3.5 - 5.0 g/dL   AST 47 (H) 15 - 41 U/L   ALT 14 14 - 54 U/L   Alkaline Phosphatase 48 38 - 126 U/L   Total Bilirubin 0.8 0.3 - 1.2 mg/dL   GFR calc non Af Amer >60 >60  mL/min   GFR calc Af Amer >60 >60 mL/min    Comment: (NOTE) The eGFR has been calculated using the CKD EPI equation. This calculation has not been validated in all clinical situations. eGFR's persistently <60 mL/min signify possible Chronic Kidney Disease.    Anion gap 10 5 - 15  CBC     Status: Abnormal   Collection Time: 10/20/2015 12:35 PM  Result Value Ref Range   WBC 13.4 (  H) 4.0 - 10.5 K/uL   RBC 2.92 (L) 3.87 - 5.11 MIL/uL   Hemoglobin 8.4 (L) 12.0 - 15.0 g/dL   HCT 27.2 (L) 36.0 - 46.0 %   MCV 93.2 78.0 - 100.0 fL   MCH 28.8 26.0 - 34.0 pg   MCHC 30.9 30.0 - 36.0 g/dL   RDW 20.1 (H) 11.5 - 15.5 %   Platelets 128 (L) 150 - 400 K/uL  Brain natriuretic peptide     Status: Abnormal   Collection Time: 10/17/2015 12:35 PM  Result Value Ref Range   B Natriuretic Peptide 402.4 (H) 0.0 - 100.0 pg/mL  Blood culture (routine x 2)     Status: None (Preliminary result)   Collection Time: 09/30/2015 12:50 PM  Result Value Ref Range   Specimen Description BLOOD LEFT PORTA CATH    Special Requests IN PEDIATRIC BOTTLE  4CC    Culture NO GROWTH < 24 HOURS    Report Status PENDING   Blood culture (routine x 2)     Status: None (Preliminary result)   Collection Time: 10/10/2015  1:00 PM  Result Value Ref Range   Specimen Description BLOOD RIGHT HAND    Special Requests IN PEDIATRIC BOTTLE  1CC    Culture NO GROWTH < 24 HOURS    Report Status PENDING   I-Stat CG4 Lactic Acid, ED     Status: Abnormal   Collection Time: 10/23/2015  1:04 PM  Result Value Ref Range   Lactic Acid, Venous 3.84 (HH) 0.5 - 1.9 mmol/L   Comment NOTIFIED PHYSICIAN   Troponin I     Status: Abnormal   Collection Time: 10/17/2015  7:11 PM  Result Value Ref Range   Troponin I 0.10 (HH) <0.03 ng/mL    Comment: CRITICAL RESULT CALLED TO, READ BACK BY AND VERIFIED WITH: Waneta Martins 2013 10/15/2015 WBOND   Procalcitonin     Status: None   Collection Time: 10/20/2015  7:11 PM  Result Value Ref Range   Procalcitonin 2.37  ng/mL    Comment:        Interpretation: PCT > 2 ng/mL: Systemic infection (sepsis) is likely, unless other causes are known. (NOTE)         ICU PCT Algorithm               Non ICU PCT Algorithm    ----------------------------     ------------------------------         PCT < 0.25 ng/mL                 PCT < 0.1 ng/mL     Stopping of antibiotics            Stopping of antibiotics       strongly encouraged.               strongly encouraged.    ----------------------------     ------------------------------       PCT level decrease by               PCT < 0.25 ng/mL       >= 80% from peak PCT       OR PCT 0.25 - 0.5 ng/mL          Stopping of antibiotics  encouraged.     Stopping of antibiotics           encouraged.    ----------------------------     ------------------------------       PCT level decrease by              PCT >= 0.25 ng/mL       < 80% from peak PCT        AND PCT >= 0.5 ng/mL            Continuing antibiotics                                               encouraged.       Continuing antibiotics            encouraged.    ----------------------------     ------------------------------     PCT level increase compared          PCT > 0.5 ng/mL         with peak PCT AND          PCT >= 0.5 ng/mL             Escalation of antibiotics                                          strongly encouraged.      Escalation of antibiotics        strongly encouraged.   Protime-INR     Status: Abnormal   Collection Time: 10/07/2015  7:11 PM  Result Value Ref Range   Prothrombin Time 16.0 (H) 11.4 - 15.2 seconds   INR 1.27   Lactic acid, plasma     Status: Abnormal   Collection Time: 10/21/2015  7:11 PM  Result Value Ref Range   Lactic Acid, Venous 4.8 (HH) 0.5 - 1.9 mmol/L    Comment: CRITICAL RESULT CALLED TO, READ BACK BY AND VERIFIED WITH: Waneta Martins 2000 10/22/2015 WBOND   Lactic acid, plasma     Status: Abnormal   Collection Time:  10/02/15 12:00 AM  Result Value Ref Range   Lactic Acid, Venous 6.9 (HH) 0.5 - 1.9 mmol/L    Comment: CRITICAL RESULT CALLED TO, READ BACK BY AND VERIFIED WITH: M.CRAVEN,RN 10/02/15 G.MCADOO   Blood gas, arterial     Status: Abnormal   Collection Time: 10/02/15  2:00 AM  Result Value Ref Range   FIO2 21.00    Delivery systems ROOM AIR    pH, Arterial 7.549 (H) 7.350 - 7.450   pCO2 arterial 25.3 (L) 32.0 - 48.0 mmHg   pO2, Arterial 47.5 (L) 83.0 - 108.0 mmHg   Bicarbonate 22.0 20.0 - 28.0 mmol/L   Acid-base deficit 0.3 0.0 - 2.0 mmol/L   O2 Saturation 85.9 %   Patient temperature 98.6    Collection site RIGHT RADIAL    Drawn by [id number]    Sample type ARTERIAL DRAW    Allens test (pass/fail) PASS PASS  Lactic acid, plasma     Status: Abnormal   Collection Time: 10/02/15  5:40 AM  Result Value Ref Range   Lactic Acid, Venous 10.3 (HH) 0.5 - 1.9 mmol/L    Comment: CRITICAL RESULT CALLED TO, READ BACK BY AND VERIFIED WITH: M.CRAVEN,RN  0725 10/02/15 G.MCADOO   Comprehensive metabolic panel     Status: Abnormal   Collection Time: 10/02/15  5:45 AM  Result Value Ref Range   Sodium 148 (H) 135 - 145 mmol/L   Potassium 3.5 3.5 - 5.1 mmol/L   Chloride 116 (H) 101 - 111 mmol/L   CO2 17 (L) 22 - 32 mmol/L   Glucose, Bld 123 (H) 65 - 99 mg/dL   BUN 56 (H) 6 - 20 mg/dL   Creatinine, Ser 0.75 0.44 - 1.00 mg/dL   Calcium 7.3 (L) 8.9 - 10.3 mg/dL   Total Protein 3.2 (L) 6.5 - 8.1 g/dL   Albumin 1.4 (L) 3.5 - 5.0 g/dL   AST 48 (H) 15 - 41 U/L   ALT 13 (L) 14 - 54 U/L   Alkaline Phosphatase 32 (L) 38 - 126 U/L   Total Bilirubin 0.5 0.3 - 1.2 mg/dL   GFR calc non Af Amer >60 >60 mL/min   GFR calc Af Amer >60 >60 mL/min    Comment: (NOTE) The eGFR has been calculated using the CKD EPI equation. This calculation has not been validated in all clinical situations. eGFR's persistently <60 mL/min signify possible Chronic Kidney Disease.    Anion gap 15 5 - 15  CBC     Status: Abnormal    Collection Time: 10/02/15  8:00 AM  Result Value Ref Range   WBC 12.2 (H) 4.0 - 10.5 K/uL    Comment: WHITE COUNT CONFIRMED ON SMEAR   RBC 1.22 (L) 3.87 - 5.11 MIL/uL   Hemoglobin 3.5 (LL) 12.0 - 15.0 g/dL    Comment: RESULTS VERIFIED VIA RECOLLECT REPEATED TO VERIFY CRITICAL RESULT CALLED TO, READ BACK BY AND VERIFIED WITH: J DRAPERS,RN 10/02/15 0837 RHOLMES    HCT 11.8 (L) 36.0 - 46.0 %   MCV 96.7 78.0 - 100.0 fL   MCH 28.7 26.0 - 34.0 pg   MCHC 29.7 (L) 30.0 - 36.0 g/dL   RDW 20.9 (H) 11.5 - 15.5 %   Platelets PLATELET CLUMPS NOTED ON SMEAR, UNABLE TO ESTIMATE 150 - 400 K/uL  Lactic acid, plasma     Status: Abnormal   Collection Time: 10/02/15  8:00 AM  Result Value Ref Range   Lactic Acid, Venous 13.6 (HH) 0.5 - 1.9 mmol/L    Comment: RESULTS CONFIRMED BY MANUAL DILUTION CRITICAL RESULT CALLED TO, READ BACK BY AND VERIFIED WITH: RN DRAPER,J AT 0901 MARTINB   Type and screen [place]     Status: None (Preliminary result)   Collection Time: 10/02/15  8:20 AM  Result Value Ref Range   ABO/RH(D) PENDING    Antibody Screen PENDING    Sample Expiration 10/05/2015    Unit Number [id number]    Blood Component Type RED CELLS,LR    Unit division 00    Status of Unit ISSUED    Unit tag comment VERBAL ORDERS PER DR DE DIOS    Transfusion Status OK TO TRANSFUSE    Crossmatch Result PENDING    Unit Number [id number]    Blood Component Type RED CELLS,LR    Unit division 00    Status of Unit ISSUED    Unit tag comment VERBAL ORDERS PER DR DE DIOS    Transfusion Status OK TO TRANSFUSE    Crossmatch Result PENDING   I-STAT 3, arterial blood gas (G3+)     Status: Abnormal   Collection Time: 10/02/15  9:54 AM  Result Value Ref Range   pH,  Arterial 7.180 (LL) 7.350 - 7.450   pCO2 arterial 37.2 32.0 - 48.0 mmHg   pO2, Arterial 34.0 (LL) 83.0 - 108.0 mmHg   Bicarbonate 13.9 (L) 20.0 - 28.0 mmol/L   TCO2 15 0 - 100 mmol/L   O2 Saturation 53.0 %   Acid-base  deficit 13.0 (H) 0.0 - 2.0 mmol/L   Patient temperature 98.1 F    Collection site RADIAL, ALLEN'S TEST ACCEPTABLE    Drawn by RT    Sample type ARTERIAL    Comment NOTIFIED PHYSICIAN    Dg Chest Portable 1 View  Result Date: 10/07/2015 CLINICAL DATA:  Abdominal pain.  Nausea.  Fever .  Lung cancer. EXAM: PORTABLE CHEST 1 VIEW COMPARISON:  09/28/2015.  CT 08/27/2015. FINDINGS: PowerPort catheter in stable position. Heart size stable. Worsening infiltrate in the the right upper lobe. Underlying mass cannot be excluded. Mild left base infiltrate noted. Small left pleural effusion. No pneumothorax . IMPRESSION: 1. PowerPort catheter noted in good anatomic position. 2. Progress infiltrate right upper lobe with possible underlying mass in this patient with known history of lung cancer. 3. Mild left base infiltrate consistent pneumonia. Small left pleural effusion. 4. Stable mild cardiomegaly. Electronically Signed   By: Marcello Moores  Register   On: 10/05/2015 11:54   Review of Systems  Unable to perform ROS: Intubated   Blood pressure 114/69, pulse (!) 137, temperature 98.1 F (36.7 C), resp. rate (!) 25, height 5' 1"  (1.549 m), weight 57.2 kg (126 lb 3.2 oz), SpO2 100 %. Physical Exam  Constitutional: She is intubated.  Emaciated black female lying in a pool of bloody stool   HENT:  Head: Normocephalic and atraumatic.  Cardiovascular: Tachycardia present.   Respiratory: She is intubated.  GI: Soft. Bowel sounds are normal.  Skin: Skin is warm and dry.  Patchy depigmented rash on the face    Assessment/Plan: 1) Acute GI bleed with severe anemia probably from the duodenal ulcer or from PUD due to steroids or bleeding from the PEG site. Considering her multiple co-morbidities and her severe hypotension with ongoing GI bleeding, and poor prognosis overall, the patient was made DNR after extensive discussion with th patient's husband Megan Sims and all her 3 children. Also discussed this with CCM. 2)  History of lung cancer followed by Dr. Alvy Bimler.   3) Dermatomyositis followed at [facility]   Ellana Kawa 10/02/2015, 10:06 AM

## 2015-10-02 NOTE — Progress Notes (Signed)
CRITICAL VALUE ALERT  Critical value received:  Lactic Acid 13.6  Date of notification:  10/02/15  Time of notification:  0916  Critical value read back:Yes.    Nurse who received alert:  Ellard Artis  MD notified (1st page):  Richardson Landry Minor  Time of first page:  [phone]  MD notified (2nd page):  Time of second page:  Responding MD:  Minor  Time MD responded:  [phone]

## 2015-10-02 NOTE — Consult Note (Signed)
PULMONARY / CRITICAL CARE MEDICINE   Name: Megan Sims MRN: [id number] DOB: 1955/05/28    ADMISSION DATE:  09/30/2015 CONSULTATION DATE:  10/02/2015  REFERRING MD:  [facility] Dr. Erlinda Hong  CHIEF COMPLAINT:  Unresponsiveness. Hypovolemic shock  HISTORY OF PRESENT ILLNESS:    Pt is a 60 y.o. female with medical history significant for Lung cancer, not currently on treatment, history of dermatomyositis, CHF,and a recent admission for GI bleed due to duodenal ulcer on PPI (H pylori by US biopsy pending) from 9/5 through 09/30/2015,at which time, she was diagnosed with HCAP, sent home with Levaquin, failing to take the medicine, now presenting with worsening respiratory symptoms.she reports increasing shortness of breath, increased mucus production in the setting of chronic hemoptysis, as well as generalized weakness. The patient denies fevers, chills or diaphoresis. She denies any headaches. She reports chest pain with cough, but no cardiac complaints. She denies any nausea vomiting or diarrhea. She denies any dysuria or gross hematuria.  At the emergency room, she was hemodynamically stable. Her hemoglobin was 8.4. She was started on broad-spectrum antibiotics. She received 2 L of IV fluids for possible sepsis. She was transferred to the floors. She had 2-3 large "dark colored" bowel movements overnight. This morning, the nurse walked in and patient was unresponsive. She was intubated for airway protection. Transferred to the ICU. Her hemoglobin dropped to 3.5 from 8.4.  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  PAST SURGICAL HISTORY: She  has a past surgical history that includes Chest tube insertion; Tubal ligation; Cerebral aneurysm repair (96 or 97); Colonoscopy; Esophagogastroduodenoscopy (06/16/2015); Laryngoscopy (Jun 22, 2015); Esophagogastroduodenoscopy (N/A, 06/16/2015); PEG placement (N/A, 07/06/2015); Esophagogastroduodenoscopy (N/A, 07/06/2015); Intramedullary (im) nail intertrochanteric (Left, 08/07/2015); and Esophagogastroduodenoscopy (N/A, 09/29/2015).  Allergies  Allergen Reactions  . Hydrocodone-Acetaminophen Swelling    Increases secretions and sensation of throat swelling  . Demeclocycline Other (See Comments)    PILLESOPHAGITIS  . Tetracyclines & Related     PILLESOPHAGITIS  . Tramadol     SWELLING    No current facility-administered medications on file prior to encounter.    Current Outpatient Prescriptions on File Prior to Encounter  Medication Sig  . albuterol (PROVENTIL HFA;VENTOLIN HFA) 108 (90 Base) MCG/ACT inhaler Inhale 1-2 puffs into the lungs every 6 (six) hours as needed for wheezing or shortness of breath.  . Amino Acids-Protein Hydrolys (FEEDING SUPPLEMENT, PRO-STAT SUGAR FREE 64,) LIQD Place 30 mLs into feeding tube daily.  Marland Kitchen amLODipine (NORVASC) 10 MG tablet Take 1 tablet (10 mg total) by mouth daily.  . fentaNYL (DURAGESIC - DOSED MCG/HR) 25 MCG/HR patch Place 1 patch (25 mcg total) onto the skin every 3 (three) days.  . furosemide (LASIX) 20 MG tablet Place 20 mg into feeding tube daily.  . hydrocortisone 2.5 % ointment Apply 1 application topically 2 (two) times daily. To face  . metoprolol tartrate (LOPRESSOR) 25 MG tablet Take 0.5 tablets (12.5 mg total) by mouth 2 (two) times daily.  Marland Kitchen morphine (ROXANOL) 20 MG/ML concentrated solution Place 1 mL (20 mg total) into feeding tube every  2 (two) hours as needed for severe  pain.  . Nutritional Supplements (FEEDING SUPPLEMENT, JEVITY 1.2 CAL,) LIQD Place 392 mLs into feeding tube every 3 (three) hours.  Marland Kitchen nystatin (MYCOSTATIN) 100000 UNIT/ML suspension Take 5 mLs by mouth 4 (four) times daily. Swish and spit for thrush  . pantoprazole sodium (PROTONIX) 40 mg/20 mL PACK Place 20 mLs (40 mg total) into feeding tube 2 (two) times daily.  . predniSONE (DELTASONE) 5 MG tablet Take by mouth once daily as directed: 15 mg daily for 3 days the 10 mg daily. (Patient taking differently: Take 10 mg by mouth daily with breakfast. )  . triamcinolone ointment (KENALOG) 0.1 % Apply 1 application topically 2 (two) times daily. From neck down. Do not use on face..  . Water For Irrigation, Sterile (FREE WATER) SOLN Place 100 mLs into feeding tube every 6 (six) hours.  Marland Kitchen levofloxacin (LEVAQUIN) 750 MG tablet Place 1 tablet (750 mg total) into feeding tube daily.    FAMILY HISTORY:  Her '@FAMSTP'$ (<SUBSCRIPT> error)@  SOCIAL HISTORY: She  reports that she has been smoking Cigarettes.  She has a 17.00 pack-year smoking history. She has never used smokeless tobacco. She reports that she does not drink alcohol or use drugs.  REVIEW OF SYSTEMS:   Unable to obtain.  SUBJECTIVE:  Unable to obtain. No family around. Patient is intubated.  VITAL SIGNS: BP 114/69   Pulse (!) 137   Temp 98.1 F (36.7 C)   Resp (!) 29   Ht '5\' 1"'$  (1.549 m)   Wt 57.2 kg (126 lb 3.2 oz)   SpO2 93%   BMI 23.85 kg/m   HEMODYNAMICS:    VENTILATOR SETTINGS:    INTAKE / OUTPUT: I/O last 3 completed shifts: In: 6759 [I.V.:3072.5; NG/GT:391.5; IV Piggyback:150] Out: -   PHYSICAL EXAMINATION: General:  Minimally responsive. Maybe grimaces on deep sternal rub. Neuro:  CN grossly intact. No lateralizing signs noted. Supple neck. HEENT:  PERLA. (-) NVD. Unable to examine airway. ET tube in place. Cardiovascular:  Tachycardic. No S3, rub, gallop, murmur.  Lungs:  Fair air entry. Rhonchi in the right  upper and mid lung zones. (-)Wheezing. Abdomen:  Dec BS. Soft abdomen. (-) masses/tenderness.  Musculoskeletal:  Gr 1 edema. Cool distal extremities.  Skin:  (-) clubbing/cyanosi/rash  LABS:  BMET  Recent Labs Lab 09/30/15 0511 10/17/2015 1235 10/02/15 0545  NA 141 142 148*  K 4.4 3.5 3.5  CL 105 108 116*  CO2 28 24 17*  BUN 53* 62* 56*  CREATININE 0.83 0.85 0.75  GLUCOSE 187* 99 123*    Electrolytes  Recent Labs Lab 09/30/15 0511 10/11/2015 1235 10/02/15 0545  CALCIUM 6.7* 8.2* 7.3*    CBC  Recent Labs Lab 09/30/15 0511 09/30/15 1220 10/10/2015 1235 10/02/15 0800  WBC 13.4*  --  13.4* PENDING  HGB 7.2* 8.7* 8.4* 3.5*  HCT 21.5* 26.2* 27.2* 11.8*  PLT 183  --  128* PENDING    Coag's  Recent Labs Lab 09/28/15 1831 09/30/2015 1911  APTT 27  --   INR 1.07 1.27    Sepsis Markers  Recent Labs Lab 09/25/2015 1911 10/02/15 0000 10/02/15 0540  LATICACIDVEN 4.8* 6.9* 10.3*  PROCALCITON 2.37  --   --     ABG  Recent Labs Lab 10/02/15 0200  PHART 7.549*  PCO2ART 25.3*  PO2ART 47.5*    Liver Enzymes  Recent Labs Lab 09/29/15 0415 10/03/2015 1235 10/02/15 0545  AST 42* 47* 48*  ALT 14 14 13*  ALKPHOS 42 48 32*  BILITOT 0.5 0.8 0.5  ALBUMIN 2.4* 2.0* 1.4*    Cardiac Enzymes  Recent Labs Lab 09/29/15 0700 10/09/2015 1911  TROPONINI 0.04* 0.10*    Glucose  Recent Labs Lab 09/29/15 1701 09/29/15 2031 09/29/15 2333 09/30/15 0423 09/30/15 0741 09/30/15 1127  GLUCAP 102* 149* 163* 163* 159* 184*    Imaging Dg Chest Portable 1 View  Result Date: 10/20/2015 CLINICAL DATA:  Abdominal pain.  Nausea.  Fever .  Lung cancer. EXAM: PORTABLE CHEST 1 VIEW COMPARISON:  09/28/2015.  CT 08/27/2015. FINDINGS: PowerPort catheter in stable position. Heart size stable. Worsening infiltrate in the the right upper lobe. Underlying mass cannot be excluded. Mild left base infiltrate noted. Small left pleural effusion. No pneumothorax . IMPRESSION: 1.  PowerPort catheter noted in good anatomic position. 2. Progress infiltrate right upper lobe with possible underlying mass in this patient with known history of lung cancer. 3. Mild left base infiltrate consistent pneumonia. Small left pleural effusion. 4. Stable mild cardiomegaly. Electronically Signed   By: Marcello Moores  Register   On: 10/21/2015 11:54     STUDIES:  Echo 9/9 >   CULTURES: Blood 9/9 > Trache 9/9 > MRSA 9/9 >   ANTIBIOTICS: Vanc 9/8 > Cefepime 9/8 >   SIGNIFICANT EVENTS: 9/8 admitted for possible HCAP.  9/9 intubated for respiratory failure. Transferred to ICU.  LINES/TUBES: L port R IJ 9/9 >   DISCUSSION: 30 female, history of lung cancer, recent admit for healthcare associated pneumonia and GI bleed secondary to duodenal ulcer, admitted for altered mental status, sepsis, possible healthcare associated pneumonia again. Intubated for respiratory failure on 9/9 and profoundly anemic likely from GI bleed again.   ASSESSMENT / PLAN:  PULMONARY A: Acute hypoxemic respiratory failure secondary to unable to protect airway, healthcare associated pneumonia right upper lung and right middle lung, concern for aspiration pneumonia. H/O Lung CA P:   Keep on a ventilator. Not ready for weaning. Check ABG after an hour. Just intubated. Repeat chest x-ray after central line insertion. Neb Treatments. Broad-spectrum antibiotics.  CARDIOVASCULAR A:  Hypovolemic shock 2/2 GI bleed (bleeding DU), component of septic shock as well.  Sinus tachycardia R/O ACS P:  Trend  troponin, check echo  RENAL A:   Azotemia. Metabolic acidosis. Lactic acidosis. P:   We'll emergently transfuse 3 units packed red blood cells. Continue IV hydration.  GASTROINTESTINAL A:   History of duodenal ulcer. S/P EGD on 9/6 P:   We'll consult GI. Transfuse pRBC. Will order 3 u pRBC now emergently. Check hb and hct q6.  Protonix drip  HEMATOLOGIC A:   Severe anemia. Hemorrhagic and  hypovolemic shock secondary to GI bleed. P:  Transfuse packed red blood cells as ordered.  INFECTIOUS A:   HCAP, R mid-lower. Possible aspiration pneumonia. P:   Cont vanc and cefepime.  Panculture.   ENDOCRINE A:   No issues.  P:   cbg q 4 SSI  NEUROLOGIC A:   AMS likely 2/2 hypovolemic shock  P:   RASS goal: 0 - -1 Off sedation now. May need when necessary fentanyl and versed.    FAMILY  - Updates: Spoke to daughter at length (Katrina Bade)   - Inter-disciplinary family meet or Palliative Care meeting due by:  Sept 16  Critical care time with this patient today: 35 minutes   J. Shirl Harris, MD Pulmonary and [place] Pager: [phone] After 3 pm or if no response, call  [phone]  10/02/2015, 8:59 AM

## 2015-10-02 NOTE — Progress Notes (Signed)
CRITICAL VALUE ALERT  Critical value received:  Lactic Acid 2.6  Date of notification:  10/02/15  Time of notification:  8677  Critical value read back:Yes.    Nurse who received alert:  Ellard Artis, RN  MD notified (1st page):  Dr. Emmit Alexanders  Time of first page:  [phone]  MD notified (2nd page):  Time of second page:  Responding MD:  Dr. Emmit Alexanders  Time MD responded:  [phone]

## 2015-10-02 NOTE — Progress Notes (Signed)
eLink Physician-Brief Progress Note Patient Name: Megan Sims DOB: November 14, 1955 MRN: [id number]   Date of Service  10/02/2015  HPI/Events of Note  Anemia - Hgb = 6.8.   eICU Interventions  Will order: 1. Transfuse 1 unit PRBC when available.      Intervention Category Major Interventions: Other:  Megan Sims,Megan Sims 10/02/2015, 10:18 PM

## 2015-10-02 NOTE — Code Documentation (Signed)
CODE BLUE NOTE  Patient Name: Megan Sims   MRN: [id number]   Date of Birth/ Sex: Jun 18, 1955 , female      Admission Date: 09/26/2015  Attending Provider: Florencia Reasons, MD  Primary Diagnosis: Sepsis due to pneumonia [facility])    Indication: Pt was in her usual state of health until this AM, when she was noted to be unarousable to sternal rub and agonal breathing. Code blue was subsequently called. At the time of arrival on scene, ACLS protocol was underway.    Technical Description:  - CPR performance duration:  None, pulse >60 throughout, verified by myself with femoral pulse doppler  - Was defibrillation or cardioversion used? No   - Was external pacer placed? No  - Was patient intubated pre/post CPR? Yes > bagged until anesthesia arrived, intubated successfully     Medications Administered: Y = Yes; Blank = No Amiodarone    Atropine    Calcium    Epinephrine    Lidocaine    Magnesium    Norepinephrine    Phenylephrine    Sodium bicarbonate    Vasopressin      Post CPR evaluation:  - Final Status - Was patient successfully resuscitated ? Yes - What is current rhythm? Sinus tach - What is current hemodynamic status? Initial BP 55/35, repeat 77/55, fluids running wide open, levophed started at 5, transferred to [place]:  - Labs sent, including: Repeat Hgb > initial this am was 3.0  - Primary team notified?  Yes  - Family Notified? No  - Additional notes/ transfer status: Recent history of GI bleed, melenotic discharge in bed; lactate 10, 2 units PRBC ordered        Sela Hilding, MD  10/02/2015, 8:26 AM

## 2015-10-02 NOTE — Progress Notes (Signed)
LB PCCM  I discussed the case with GI Dr. Collene Mares. She is aware of patient's critical condition.  Monica Becton, MD 10/02/2015, 10:01 AM [place] Pulmonary and Critical Care Pager [phone] After 3 pm or if no answer, call [phone]

## 2015-10-02 NOTE — Progress Notes (Signed)
eLink Physician-Brief Progress Note Patient Name: Megan Sims DOB: [birth year] MRN: [id number]   Date of Service  10/02/2015  HPI/Events of Note  Elevated HR - Sinus Tachycardia with rate = 131.   eICU Interventions  Will order: 1. Bolus with 0.9 NaCl 1 liter IV over 1 hour now. 2. Monitor CVP.     Intervention Category Intermediate Interventions: Arrhythmia - evaluation and management  Jay Haskew Eugene 10/02/2015, 8:18 PM

## 2015-10-02 NOTE — Significant Event (Signed)
Rapid Response Event Note  Overview:  Called STAT for patient unresponsive Time Called: 0804 Arrival Time: 0805 Event Type: Respiratory  Initial Focused Assessment:  Called STAT for patient noted to be unresponsive.  En route to room, CODE BLUE called.  On arrival to patients room, patient being bagged with 100% oxygen.  SBP 50's, HR 60's.  Patient had faint thready pulse.     Interventions:  See code Blue sheet.  PAtient intubated, and levophed drip started at 5 mcg/min.  SBP 90's.  MD at bedside.  Lab unable to obtain blood.    Plan of Care (if not transferred):  Event Summary:  Transported to 2 m 6   at      at          [facility], Harlin Rain

## 2015-10-02 NOTE — Progress Notes (Signed)
Megan Sims   DOB:02-22-1955   NW#:[id number]    Oncology History   Bilateral lung cancer   Staging form: Lung, AJCC 6th Edition     Clinical stage from 10/01/2014: Stage IIIB (T4(m), N3, M0) - Signed by Heath Lark, MD on 10/01/2014 Cancer of upper lobe of right lung   Staging form: Lung, AJCC 7th Edition     Clinical: Stage Unknown (TX, N3, M0) - Signed by Eppie Gibson, MD on 10/07/2014       Bilateral lung cancer ([place])   09/05/2014 - 09/12/2014 Hospital Admission    The patient was admitted to the hospital for further management of possible skin reaction, dysphagia and supraclavicular lymphadenopathy      09/06/2014 Imaging    CT scan of the neck showed malignant appearing superior mediastinal and right thoracic inlet lymphadenopathy      09/10/2014 Imaging    CT scan of the abdomen and pelvis confirmed right supraclavicular and mediastinal lymphadenopathy, as described above but no other evidence of metastatic cancer       09/14/2014 Procedure    Flexible endoscopy in the ENT office failed to reveal any primary in the head and neck region      09/16/2014 Imaging    She failed barium swallow. 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      01/28/2015 Imaging    CT scan showed no active disease      04/30/2015 Imaging    Mild patchy/ground-glass opacity in the right upper lung lobe, favored to reflect mild residual pneumonia. Associated 10 x 15 mm irregular nodular opacity in the right upper lobe, new, likely infectious.       06/14/2015 Imaging    CT neck nonspecific inflammation. CT chest showed Small focal opacities along the right minor fissure and at the right upper lobe are similar in appearance to the prior CTA, though less hazy      06/16/2015 Procedure    She had EGD and biopsy. EGD showed edematous cricopharyngeus      07/07/2015 Surgery    She has PEG tube placement      07/07/2015 Procedure    EGD showed moderate duodenitis and ulcer      08/02/2015 Imaging    BM:[id number] development of complete right middle lobe collapse without a discrete central obstructing lesion evident. The irregular ill-defined central airspace opacity identified previously in the posterior right upper lobe is not substantially changed      I was requested by the patient's family to  see the patient urgently as she was readmitted to the hospital after presentation with high-grade fever and GI bleed. She was admitted to the stepdown unit with broad-spectrum IV antibiotics. That is a code blue event today and the patient was subsequently found to have severe GI hemorrhage requiring intubation  Objective:  Vitals:   10/02/15 1445 10/02/15 1607  BP: 140/83   Pulse: (!) 126   Resp: (!) 30   Temp:  98.6 F (37 C)     Intake/Output Summary (Last 24 hours) at 10/02/15 1651 Last data filed at 10/02/15 1400  Gross per 24 hour  Intake          3168.13 ml  Output                0 ml  Net          3168.13 ml    GENERAL:sedated and intubated SKIN: skin thickening on her face is noted EYES: closed OROPHARYNX:intubated NEURO:sedated and ventilated   Labs:  Lab Results   Component Value Date   WBC 14.0 (H) 10/02/2015   HGB 7.8 (L) 10/02/2015   HCT 23.8 (L) 10/02/2015   MCV 87.8 10/02/2015   PLT 67 (L) 10/02/2015   NEUTROABS 13.2 (H) 09/28/2015    Lab Results  Component Value Date   NA 148 (H) 10/02/2015   K 3.5 10/02/2015   CL 116 (H) 10/02/2015   CO2 17 (L) 10/02/2015    Studies:  Dg Chest Port 1 View  Result Date: 10/02/2015 CLINICAL DATA:  Central line and endotracheal tube placement. EXAM: PORTABLE CHEST 1 VIEW COMPARISON:  10/02/2015, earlier the same day. FINDINGS: 0925 hours. Endotracheal tube tip is approximately 4.2 cm above the base of the carina. Right IJ central line tip is obscured by defibrillator device, but is in the region of the SVC/RA junction. Left Port-A-Cath remains in place. Persistent right suprahilar opacity compatible with pneumonia or neoplasm. Telemetry leads overlie the chest. IMPRESSION: 1. Endotracheal tube tip 4.2 cm above the base the chronic. 2. Right IJ central line tip obscured by defibrillator pad, but positioned over the region of the distal SVC near the RA junction. Electronically Signed   By: Misty Stanley M.D.   On: 10/02/2015 10:21   Dg Chest Port 1 View  Result Date: 10/02/2015 CLINICAL DATA:  Sepsis. EXAM: PORTABLE CHEST 1 VIEW COMPARISON:  Radiograph of October 01, 2015. FINDINGS: Stable cardiomediastinal silhouette. Left internal jugular Port-A-Cath is unchanged in position. No pneumothorax is noted. Left lung is clear. No pleural effusion is noted. Stable right upper lobe opacity is noted in right hilar prominence is noted consistent with history of lung cancer and possible associated atelectasis or pneumonia. Bony thorax is unremarkable. IMPRESSION: Stable right upper lobe opacity is described above. Electronically Signed   By: Marijo Conception, M.D.   On: 10/02/2015 10:11   Dg Chest Portable 1 View  Result Date: 10/11/2015 CLINICAL DATA:  Abdominal pain.  Nausea.  Fever .  Lung cancer. EXAM: PORTABLE CHEST 1  VIEW COMPARISON:  09/28/2015.  CT 08/27/2015. FINDINGS: PowerPort catheter in stable position. Heart size stable. Worsening infiltrate in the the right upper lobe. Underlying mass cannot be excluded. Mild left base infiltrate noted. Small left pleural effusion. No pneumothorax . IMPRESSION: 1. PowerPort catheter noted in good anatomic position. 2. Progress infiltrate right upper lobe with possible underlying mass in this patient with known history of lung cancer. 3. Mild left base infiltrate consistent pneumonia. Small left pleural effusion. 4.  Stable mild cardiomegaly. Electronically Signed   By: Marcello Moores  Register   On: 10/15/2015 11:54    Assessment & Plan:   Bilateral lung cancer [facility]) The patient has frequent visits to the emergency department for many reasons. Her last CT angiogram early this month showed no definitive signs of cancer recurrence. With poor performance status, even if there are signs of cancer recurrence, the patient would not be a candidate for further chemotherapy  Dermatomyositis associated with neoplastic disease [facility]) She has recent skin biopsy at [facility] at, from diagnosis of dermatomyositis. The patient is on prednisone for this. Dermatomyositis can be associated with very poor prognosis.  Severe anemia due to GI bleed Thrombocytopenia likely due to consumption Appreciated GI review. This is related to bleeding ulcer. She is on high-dose IV protonix. She has received blood transfusion. The cause is multifactorial, could be related to severe gastritis from chronic steroid therapy versus pressure sore from presence of feeding tube.  Goals of care The patient is currently intubated. She is on pressor support with cardiovascular instability I have long discussion with patient and family members. Dermatomyositis is associated with very poor prognosis in general. The patient have recurrent hospitalization, progressive decline in performance status and poor  quality of life. I recommend transitioning her care to comfort care with possible terminal sedation plus minus extubation Family members are appreciated of my visit today  [place], [place], MD 10/02/2015  4:51 PM

## 2015-10-02 NOTE — Progress Notes (Signed)
   LB PCCM  Dr. Collene Mares spoke with family and discussed with them code status. Family had lots of questions and we discussed code status again. They said they are OK with DNR, no HD, no surgery, no escalation of care.  Not ready for extubation or comfort care.  Need to talk to husband re: blood transfusion if she ends up requiring repeated transfusion with active GI bleed. He will likely say no to blood transfusion. Will check cbc now and go from there.   Monica Becton, MD 10/02/2015, 12:16 PM  Pulmonary and Critical Care Pager [phone] After 3 pm or if no answer, call [phone]

## 2015-10-02 NOTE — Progress Notes (Signed)
CRITICAL VALUE ALERT  Critical value received:  Hgb 6.8  Date of notification:  10/02/2015  Time of notification:  20:15   Critical value read back:Yes.    Nurse who received alert:  Tollie Eth, RN   MD notified (1st page):  Warren Lacy MD   Time of first page:  20:15  Responding MD: Oletta Darter, MD   Time MD responded: 20:18

## 2015-10-02 NOTE — Progress Notes (Signed)
PROGRESS NOTE  Megan Sims UDJ:[id number] DOB: 22-Dec-1955 DOA: 10/10/2015 PCP: Jani Gravel, MD  HPI/Recap of past 24 hours:   Assessment/Plan: Principal Problem:   Sepsis due to pneumonia [facility]) Active Problems:   Dysphagia   Essential hypertension   Bilateral lung cancer ([place])   Paget's disease of bone   Oral thrush   Nicotine dependence   Chronic neck pain   Cancer of upper lobe of right lung ([place])   Anemia due to other cause   Dehydration   Deficiency anemia   HCAP (healthcare-associated pneumonia)   Duodenal ulcer hemorrhagic   Hypovolemic shock and septic shock:  Per RN, patient has three large bloody bm last night, Patient was unresponsive with agonal breathing this am, sbp dropped to the 50's on 9/9 early am, patient was bagged with 100% oxygen saturation, code blue called, patient is intubated for airway protection, she did not loose her pulse through out, no cpr or code blue meds given, stat cbc showed hgb 3.5, stat prbc transfusion 2units ordered initially, another 2untis ordered later /pressor/stress dose steroid ordered, ICU attending notified, patient is to transfer to ICU.  I have called patient's husband  Mr Lamarr Lulas and patient's daughter Nykira Reddix and provided update, family expressed understanding and appreciation.    Healthcare associated pneumonia, worsening. Patient  dc'd on Levaquin on 9/7 but did not take.   Chest x-ray  mild left base infiltrate consistent pneumonia. Small left pleural effusion. Known RUL mass.  -Antibiotics per protocol with vanc, Cefepime  -Blood cultures Procalcitonin -Strep pneumo urine antigen -Legionella urine antigen     History of Gastro Intestinal Bleed due to duodenal ulcer. H [place] neg. Hb 8.4 Continue PPI  IVF Hold BP meds  Chronic  diastolic CHF.  Last EF 10/2014 EF 55 % w Grade 1 DD  Continue meds  -monitor I/Os -daily weights -prn 02  History of dermatomyositis, on prednisone. On stress dose  steroids  Lung cancer, stage 3 B, not currently on treatment , followed by Dr. Alvy Bimler at the [facility]  History of dysphagia, on G tube feeds,   DVT prophylaxis:   SCD's  Code Status:   Full    Family Communication:  Discussed with patient Disposition Plan:  Procedures:  Code blue called on 9/9 am, patient is intubated on 9/9.  prbc transfusion on 9/9  Antibiotics:  vanc/cefepime   Objective: BP 114/69   Pulse (!) 137   Temp 98.1 F (36.7 C)   Resp (!) 29   Ht '5\' 1"'$  (1.549 m)   Wt 57.2 kg (126 lb 3.2 oz)   SpO2 93%   BMI 23.85 kg/m   Intake/Output Summary (Last 24 hours) at 10/02/15 0840 Last data filed at 10/02/15 0600  Gross per 24 hour  Intake             3614 ml  Output                0 ml  Net             3614 ml   Filed Weights   10/11/2015 1310 10/15/2015 1818  Weight: 57.6 kg (127 lb) 57.2 kg (126 lb 3.2 oz)    Exam:   General:  intubated  Cardiovascular: sinus tachycardia  Respiratory: intubated and ventilated, no significant wheezing appreciated  Abdomen: Soft/ND/NT, positive BS  Musculoskeletal: +Edema  Neuro: intubated  Data Reviewed: Basic Metabolic Panel:  Recent Labs Lab 09/28/15 1835 09/29/15 0415 09/30/15 0511 10/09/2015  1235 10/02/15 0545  NA 135 137 141 142 148*  K 4.5 4.1 4.4 3.5 3.5  CL 98* 103 105 108 116*  CO2 '26 28 28 24 '$ 17*  GLUCOSE 145* 147* 187* 99 123*  BUN 82* 67* 53* 62* 56*  CREATININE 0.97 0.86 0.83 0.85 0.75  CALCIUM 8.2* 7.8* 6.7* 8.2* 7.3*   Liver Function Tests:  Recent Labs Lab 09/28/15 1835 09/29/15 0415 10/23/2015 1235 10/02/15 0545  AST 50* 42* 47* 48*  ALT '14 14 14 '$ 13*  ALKPHOS 49 42 48 32*  BILITOT 0.2* 0.5 0.8 0.5  PROT 5.3* 4.7* 4.2* 3.2*  ALBUMIN 2.8* 2.4* 2.0* 1.4*    Recent Labs Lab 10/23/2015 1235  LIPASE 17   No results for input(s): AMMONIA in the last 168 hours. CBC:  Recent Labs Lab 09/28/15 1835 09/29/15 0415 09/30/15 0511 09/30/15 1220 10/10/2015 1235  10/02/15 0800  WBC 14.8* 11.8* 13.4*  --  13.4* PENDING  NEUTROABS 13.2*  --   --   --   --   --   HGB 4.1* 7.2* 7.2* 8.7* 8.4* 3.5*  HCT 13.0* 21.2* 21.5* 26.2* 27.2* 11.8*  MCV 94.2 89.8 91.1  --  93.2 96.7  PLT 197 139* 183  --  128* PENDING   Cardiac Enzymes:    Recent Labs Lab 09/29/15 0700 09/27/2015 1911  TROPONINI 0.04* 0.10*   BNP (last 3 results)  Recent Labs  10/09/2015 1235  BNP 402.4*    ProBNP (last 3 results) No results for input(s): PROBNP in the last 8760 hours.  CBG:  Recent Labs Lab 09/29/15 2031 09/29/15 2333 09/30/15 0423 09/30/15 0741 09/30/15 1127  GLUCAP 149* 163* 163* 159* 184*    Recent Results (from the past 240 hour(s))  Culture, blood (Routine x 2)     Status: None (Preliminary result)   Collection Time: 09/28/15  6:31 PM  Result Value Ref Range Status   Specimen Description BLOOD LEFT ARM  Final   Special Requests IN PEDIATRIC BOTTLE 3 CC  Final   Culture   Final    NO GROWTH 3 DAYS Performed at [facility]    Report Status PENDING  Incomplete  Culture, blood (Routine x 2)     Status: None (Preliminary result)   Collection Time: 09/28/15  6:35 PM  Result Value Ref Range Status   Specimen Description BLOOD RIGHT ARM  Final   Special Requests BOTTLES DRAWN AEROBIC AND ANAEROBIC 5 CC [facility]  Final   Culture   Final    NO GROWTH 3 DAYS Performed at [facility]    Report Status PENDING  Incomplete  Urine culture     Status: Abnormal   Collection Time: 09/28/15  7:58 PM  Result Value Ref Range Status   Specimen Description URINE, RANDOM  Final   Special Requests NONE  Final   Culture 40,000 COLONIES/mL ENTEROCOCCUS FAECALIS (A)  Final   Report Status 09/27/2015 FINAL  Final   Organism ID, Bacteria ENTEROCOCCUS FAECALIS (A)  Final      Susceptibility   Enterococcus faecalis - MIC*    AMPICILLIN <=2 SENSITIVE Sensitive     LEVOFLOXACIN 1 SENSITIVE Sensitive     NITROFURANTOIN <=16 SENSITIVE Sensitive      VANCOMYCIN 1 SENSITIVE Sensitive     * 40,000 COLONIES/mL ENTEROCOCCUS FAECALIS  MRSA PCR Screening     Status: None   Collection Time: 09/28/15  9:08 PM  Result Value Ref Range Status   MRSA by PCR NEGATIVE  NEGATIVE Final    Comment:        The GeneXpert MRSA Assay (FDA approved for NASAL specimens only), is one component of a comprehensive MRSA colonization surveillance program. It is not intended to diagnose MRSA infection nor to guide or monitor treatment for MRSA infections.   Blood culture (routine x 2)     Status: None (Preliminary result)   Collection Time: 10/05/2015  1:00 PM  Result Value Ref Range Status   Specimen Description BLOOD RIGHT HAND  Final   Special Requests IN PEDIATRIC BOTTLE  1CC  Final   Culture PENDING  Incomplete   Report Status PENDING  Incomplete     Studies: Dg Chest Portable 1 View  Result Date: 10/05/2015 CLINICAL DATA:  Abdominal pain.  Nausea.  Fever .  Lung cancer. EXAM: PORTABLE CHEST 1 VIEW COMPARISON:  09/28/2015.  CT 08/27/2015. FINDINGS: PowerPort catheter in stable position. Heart size stable. Worsening infiltrate in the the right upper lobe. Underlying mass cannot be excluded. Mild left base infiltrate noted. Small left pleural effusion. No pneumothorax . IMPRESSION: 1. PowerPort catheter noted in good anatomic position. 2. Progress infiltrate right upper lobe with possible underlying mass in this patient with known history of lung cancer. 3. Mild left base infiltrate consistent pneumonia. Small left pleural effusion. 4. Stable mild cardiomegaly. Electronically Signed   By: [place]   On: 10/11/2015 11:54    Scheduled Meds: . sodium chloride   Intravenous Once  . ceFEPime (MAXIPIME) IV  1 g Intravenous Q8H  . feeding supplement (JEVITY 1.2 CAL)  391.5 mL Per Tube Q3H  . feeding supplement (PRO-STAT SUGAR FREE 64)  30 mL Per Tube Daily  . free water  100 mL Per Tube Q6H  . pantoprazole sodium  40 mg Per Tube BID  . predniSONE  10  mg Oral Q breakfast  . sodium chloride  1,000 mL Intravenous Once   And  . sodium chloride  500 mL Intravenous Once   And  . sodium chloride  250 mL Intravenous Once  . vancomycin  500 mg Intravenous Q12H    Continuous Infusions: . sodium chloride 75 mL/hr at 10/08/2015 1542  . phenylephrine (NEO-SYNEPHRINE) Adult infusion       Time spent: 58mns  Tarun Patchell MD, PhD  [facility] Pager [phone] If 7PM-7AM, please contact night-coverage at [email or web address], password [email or web address]9/09/2015, 8:40 AM  LOS: 1 day

## 2015-10-02 NOTE — Progress Notes (Signed)
CRITICAL VALUE ALERT  Critical value received:  Hgb 3.5  Date of notification:  10/02/15  Time of notification:  0916  Critical value read back:Yes.    Nurse who received alert:  Ellard Artis, RN  MD notified (1st page):  Minor  Time of first page:  [phone]  MD notified (2nd page):  Time of second page:  Responding MD:  Minor  Time MD responded:  [phone]

## 2015-10-02 NOTE — Progress Notes (Signed)
CRITICAL VALUE ALERT  Critical value received: Lactic Acid 6.9   Date of notification:  10/02/15  Time of notification:  0110  Critical value read back: yes   Nurse who received alert:  Ailene Ravel RN   MD notified (1st page):  Donnal Debar   Time of first page:  0120   Responding MD:  Donnal Debar  Time MD responded:  [email or web address]  Order for ABG and NS Bolus given

## 2015-10-03 ENCOUNTER — Inpatient Hospital Stay (HOSPITAL_COMMUNITY): Payer: Medicaid Other

## 2015-10-03 DIAGNOSIS — R0600 Dyspnea, unspecified: Secondary | ICD-10-CM

## 2015-10-03 DIAGNOSIS — K922 Gastrointestinal hemorrhage, unspecified: Secondary | ICD-10-CM

## 2015-10-03 LAB — ECHOCARDIOGRAM COMPLETE
CHL CUP MV DEC (S): 174
CHL CUP MV M VEL: 87.7
CHL CUP RV SYS PRESS: 25 mmHg
CHL CUP TV REG PEAK VELOCITY: 235 cm/s
E/e' ratio: 13.95
EWDT: 174 ms
FS: 26 % — AB (ref 28–44)
Height: 61 in
IV/PV OW: 0.99
LA diam index: 1.45 cm/m2
LA vol A4C: 44.1 ml
LA vol: 36.4 mL
LASIZE: 25 mm
LAVOLIN: 21.1 mL/m2
LDCA: 2.84 cm2
LEFT ATRIUM END SYS DIAM: 25 mm
LV E/e' medial: 13.95
LV E/e'average: 13.95
LV PW d: 11.5 mm — AB (ref 0.6–1.1)
LVELAT: 5.44 cm/s
LVOTD: 19 mm
MV Annulus VTI: 19.9 cm
MV Peak grad: 2 mmHg
MV pk E vel: 75.9 m/s
MVAP: 3.93 cm2
MVG: 4 mmHg
MVPKAVEL: 129 m/s
P 1/2 time: 56 ms
TDI e' lateral: 5.44
TDI e' medial: 3.7
TRMAXVEL: 235 cm/s
Weight: 2380.97 oz

## 2015-10-03 LAB — CBC
HCT: 21.2 % — ABNORMAL LOW (ref 36.0–46.0)
HEMATOCRIT: 23.2 % — AB (ref 36.0–46.0)
HEMATOCRIT: 14 % — AB (ref 36.0–46.0)
HEMOGLOBIN: 4.7 g/dL — AB (ref 12.0–15.0)
Hemoglobin: 7.7 g/dL — ABNORMAL LOW (ref 12.0–15.0)
Hemoglobin: 7 g/dL — ABNORMAL LOW (ref 12.0–15.0)
MCH: 28.6 pg (ref 26.0–34.0)
MCH: 28.7 pg (ref 26.0–34.0)
MCH: 28.3 pg (ref 26.0–34.0)
MCHC: 33.2 g/dL (ref 30.0–36.0)
MCHC: 33.6 g/dL (ref 30.0–36.0)
MCHC: 33 g/dL (ref 30.0–36.0)
MCV: 86.2 fL (ref 78.0–100.0)
MCV: 85.4 fL (ref 78.0–100.0)
MCV: 85.8 fL (ref 78.0–100.0)
PLATELETS: 38 10*3/uL — AB (ref 150–400)
Platelets: 53 10*3/uL — ABNORMAL LOW (ref 150–400)
Platelets: 48 10*3/uL — ABNORMAL LOW (ref 150–400)
RBC: 2.69 MIL/uL — ABNORMAL LOW (ref 3.87–5.11)
RBC: 1.64 MIL/uL — ABNORMAL LOW (ref 3.87–5.11)
RBC: 2.47 MIL/uL — ABNORMAL LOW (ref 3.87–5.11)
RDW: 16.7 % — AB (ref 11.5–15.5)
RDW: 16.8 % — ABNORMAL HIGH (ref 11.5–15.5)
RDW: 15.3 % (ref 11.5–15.5)
WBC: 13.7 10*3/uL — ABNORMAL HIGH (ref 4.0–10.5)
WBC: 16.7 10*3/uL — AB (ref 4.0–10.5)
WBC: 14.4 10*3/uL — ABNORMAL HIGH (ref 4.0–10.5)

## 2015-10-03 LAB — BASIC METABOLIC PANEL
Anion gap: 8 (ref 5–15)
BUN: 64 mg/dL — ABNORMAL HIGH (ref 6–20)
CALCIUM: 6.9 mg/dL — AB (ref 8.9–10.3)
CO2: 24 mmol/L (ref 22–32)
CREATININE: 1.06 mg/dL — AB (ref 0.44–1.00)
Chloride: 117 mmol/L — ABNORMAL HIGH (ref 101–111)
GFR calc non Af Amer: 56 mL/min — ABNORMAL LOW (ref >60)
GLUCOSE: 185 mg/dL — AB (ref 65–99)
Potassium: 2.9 mmol/L — ABNORMAL LOW (ref 3.5–5.1)
Sodium: 149 mmol/L — ABNORMAL HIGH (ref 135–145)

## 2015-10-03 LAB — LACTIC ACID, PLASMA
Lactic Acid, Venous: 6.9 mmol/L (ref 0.5–1.9)
Lactic Acid, Venous: 13.6 mmol/L (ref 0.5–1.9)

## 2015-10-03 LAB — GLUCOSE, CAPILLARY
GLUCOSE-CAPILLARY: 151 mg/dL — AB (ref 65–99)
GLUCOSE-CAPILLARY: 152 mg/dL — AB (ref 65–99)
GLUCOSE-CAPILLARY: 161 mg/dL — AB (ref 65–99)
Glucose-Capillary: 172 mg/dL — ABNORMAL HIGH (ref 65–99)
Glucose-Capillary: 153 mg/dL — ABNORMAL HIGH (ref 65–99)
Glucose-Capillary: 154 mg/dL — ABNORMAL HIGH (ref 65–99)
Glucose-Capillary: 193 mg/dL — ABNORMAL HIGH (ref 65–99)

## 2015-10-03 LAB — CULTURE, BLOOD (ROUTINE X 2)
Culture: NO GROWTH
Culture: NO GROWTH

## 2015-10-03 LAB — PREPARE RBC (CROSSMATCH)

## 2015-10-03 LAB — PHOSPHORUS: PHOSPHORUS: 2.7 mg/dL (ref 2.5–4.6)

## 2015-10-03 LAB — MAGNESIUM: Magnesium: 1.7 mg/dL (ref 1.7–2.4)

## 2015-10-03 LAB — TROPONIN I: Troponin I: 0.22 ng/mL (ref <0.03)

## 2015-10-03 MED ORDER — DEXTROSE 5 % IV SOLN
1.0000 g | Freq: Two times a day (BID) | INTRAVENOUS | Status: DC
  Administered 2015-10-03 – 2015-10-04 (×2): 1 g via INTRAVENOUS
  Filled 2015-10-03 (×3): qty 1

## 2015-10-03 MED ORDER — WHITE PETROLATUM GEL
Status: AC
  Administered 2015-10-03: 11:00:00
  Filled 2015-10-03: qty 1

## 2015-10-03 MED ORDER — POTASSIUM CHLORIDE 20 MEQ/15ML (10%) PO SOLN
40.0000 meq | Freq: Once | ORAL | Status: AC
  Administered 2015-10-03: 40 meq
  Filled 2015-10-03: qty 30

## 2015-10-03 MED ORDER — WHITE PETROLATUM GEL
Status: DC | PRN
Start: 2015-10-03 — End: 2015-10-17
  Filled 2015-10-03 (×2): qty 1

## 2015-10-03 MED ORDER — MAGNESIUM SULFATE 2 GM/50ML IV SOLN
2.0000 g | Freq: Once | INTRAVENOUS | Status: AC
  Administered 2015-10-03: 2 g via INTRAVENOUS
  Filled 2015-10-03 (×2): qty 50

## 2015-10-03 MED ORDER — POTASSIUM CHLORIDE 10 MEQ/50ML IV SOLN
10.0000 meq | INTRAVENOUS | Status: AC
  Administered 2015-10-03 (×4): 10 meq via INTRAVENOUS
  Filled 2015-10-03 (×4): qty 50

## 2015-10-03 MED ORDER — SODIUM CHLORIDE 0.9 % IV SOLN
Freq: Once | INTRAVENOUS | Status: AC
  Administered 2015-10-03: 17:00:00 via INTRAVENOUS

## 2015-10-03 MED FILL — Medication: Qty: 1 | Status: AC

## 2015-10-03 NOTE — Progress Notes (Signed)
Echocardiogram 2D Echocardiogram has been performed.  Megan Sims 10/03/2015, 3:17 PM

## 2015-10-03 NOTE — Progress Notes (Signed)
CRITICAL VALUE ALERT  Critical value received:  Hgb 4.7  Date of notification:  10/03/15  Time of notification:  1602  Critical value read back:Yes.    Nurse who received alert:  Ellard Artis, RN   MD notified (1st page):  Corrie Dandy  Time of first page:  1602  MD notified (2nd page):  Time of second page:  Responding MD:  Corrie Dandy  Time MD responded:  [phone]

## 2015-10-03 NOTE — Progress Notes (Signed)
While spiking 1 unit of PRBC's, spike went through the blood bag. Had to reorder a new unit of PRBC's.

## 2015-10-03 NOTE — Progress Notes (Addendum)
Patient had a 6 beat run of vtach. Hgb from 2245 came back@ 7.0. Halford Chessman, MD notified. BMET sent per orders.

## 2015-10-03 NOTE — Progress Notes (Signed)
eLink Physician-Brief Progress Note Patient Name: Megan Sims DOB: March 15, 1955 MRN: [id number]   Date of Service  10/03/2015  HPI/Events of Note  K 2.9 & Mag 1.7. Has CVL & PEG. Creatinine 1.06.  eICU Interventions  1. MgSO4 2gm IV 2. KCl 27mq IV x4 runs 3. KCl 475m VT x1     Intervention Category Intermediate Interventions: Electrolyte abnormality - evaluation and management  JeTera Partridge/10/2015, 4:55 AM

## 2015-10-03 NOTE — Progress Notes (Signed)
PULMONARY / CRITICAL CARE MEDICINE   Name: Megan Sims MRN: [id number] DOB: 31-May-1955    ADMISSION DATE:  10/22/2015 CONSULTATION DATE:  10/02/2015  REFERRING MD:  [facility] Dr. Erlinda Hong  CHIEF COMPLAINT:  Unresponsiveness. Hypovolemic shock  HISTORY OF PRESENT ILLNESS:    Pt is a 60 y.o. female with medical history significant for Lung cancer, not currently on treatment, history of dermatomyositis, CHF,and a recent admission for GI bleed due to duodenal ulcer on PPI (H pylori by US biopsy pending) from 9/5 through 09/30/2015,at which time, she was diagnosed with HCAP, sent home with Levaquin, failing to take the medicine, now presenting with worsening respiratory symptoms.she reports increasing shortness of breath, increased mucus production in the setting of chronic hemoptysis, as well as generalized weakness. The patient denies fevers, chills or diaphoresis. She denies any headaches. She reports chest pain with cough, but no cardiac complaints. She denies any nausea vomiting or diarrhea. She denies any dysuria or gross hematuria.  At the emergency room, she was hemodynamically stable. Her hemoglobin was 8.4. She was started on broad-spectrum antibiotics. She received 2 L of IV fluids for possible sepsis. She was transferred to the floors. She had 2-3 large "dark colored" bowel movements overnight. This morning, the nurse walked in and patient was unresponsive. She was intubated for airway protection. Transferred to the ICU. Her hemoglobin dropped to 3.5 from 8.4.  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  PAST SURGICAL HISTORY: She  has a past surgical history that includes Chest tube insertion; Tubal ligation; Cerebral aneurysm repair (96 or 97); Colonoscopy; Esophagogastroduodenoscopy (06/16/2015); Laryngoscopy (Jun 22, 2015); Esophagogastroduodenoscopy (N/A, 06/16/2015); PEG placement (N/A, 07/06/2015); Esophagogastroduodenoscopy (N/A, 07/06/2015); Intramedullary (im) nail intertrochanteric (Left, 08/07/2015); and Esophagogastroduodenoscopy (N/A, 09/29/2015).  Allergies  Allergen Reactions  . Hydrocodone-Acetaminophen Swelling    Increases secretions and sensation of throat swelling  . Demeclocycline Other (See Comments)    PILLESOPHAGITIS  . Tetracyclines & Related     PILLESOPHAGITIS  . Tramadol     SWELLING    No current facility-administered medications on file prior to encounter.    Current Outpatient Prescriptions on File Prior to Encounter  Medication Sig  . albuterol (PROVENTIL HFA;VENTOLIN HFA) 108 (90 Base) MCG/ACT inhaler Inhale 1-2 puffs into the lungs every 6 (six) hours as needed for wheezing or shortness of breath.  . Amino Acids-Protein Hydrolys (FEEDING SUPPLEMENT, PRO-STAT SUGAR FREE 64,) LIQD Place 30 mLs into feeding tube daily.  Marland Kitchen amLODipine (NORVASC) 10 MG tablet Take 1 tablet (10 mg total) by mouth daily.  . fentaNYL (DURAGESIC - DOSED MCG/HR) 25 MCG/HR patch Place 1 patch (25 mcg total) onto the skin every 3 (three) days.  . furosemide (LASIX) 20 MG tablet Place 20 mg into feeding tube daily.  . hydrocortisone 2.5 % ointment Apply 1 application topically 2 (two) times daily. To face  . metoprolol tartrate (LOPRESSOR) 25 MG tablet Take 0.5 tablets (12.5 mg total) by mouth 2 (two) times daily.  Marland Kitchen morphine (ROXANOL) 20 MG/ML concentrated solution Place 1 mL (20 mg total) into feeding tube every  2 (two) hours as needed for severe  pain.  . Nutritional Supplements (FEEDING SUPPLEMENT, JEVITY 1.2 CAL,) LIQD Place 392 mLs into feeding tube every 3 (three) hours.  Marland Kitchen nystatin (MYCOSTATIN) 100000 UNIT/ML suspension Take 5 mLs by mouth 4 (four) times daily. Swish and spit for thrush  . pantoprazole sodium (PROTONIX) 40 mg/20 mL PACK Place 20 mLs (40 mg total) into feeding tube 2 (two) times daily.  . predniSONE (DELTASONE) 5 MG tablet Take by mouth once daily as directed: 15 mg daily for 3 days the 10 mg daily. (Patient taking differently: Take 10 mg by mouth daily with breakfast. )  . triamcinolone ointment (KENALOG) 0.1 % Apply 1 application topically 2 (two) times daily. From neck down. Do not use on face..  . Water For Irrigation, Sterile (FREE WATER) SOLN Place 100 mLs into feeding tube every 6 (six) hours.  Marland Kitchen levofloxacin (LEVAQUIN) 750 MG tablet Place 1 tablet (750 mg total) into feeding tube daily.    FAMILY HISTORY:  Her '@FAMSTP'$ (<SUBSCRIPT> error)@  SOCIAL HISTORY: She  reports that she has been smoking Cigarettes.  She has a 17.00 pack-year smoking history. She has never used smokeless tobacco. She reports that she does not drink alcohol or use drugs.  REVIEW OF SYSTEMS:   Unable to obtain.  SUBJECTIVE:  Unable to obtain. No family around. Patient is intubated.  VITAL SIGNS: BP 109/66 (BP Location: Right Arm)   Pulse (!) 111   Temp 97.7 F (36.5 C) (Oral)   Resp 17   Ht '5\' 1"'$  (1.549 m)   Wt 67.5 kg (148 lb 13 oz)   SpO2 100%   BMI 28.12 kg/m   HEMODYNAMICS: CVP:  [0 mmHg-7 mmHg] 6 mmHg  VENTILATOR SETTINGS: Vent Mode: PSV;CPAP FiO2 (%):  [40 %] 40 % Set Rate:  [15 bmp] 15 bmp Vt Set:  [390 mL] 390 mL PEEP:  [5 cmH20] 5 cmH20 Pressure Support:  [5 cmH20] 5 cmH20 Plateau Pressure:  [12 cmH20] 12 cmH20  INTAKE / OUTPUT: I/O last 3 completed shifts: In: 6258.3 [I.V.:4497; Blood:919.8; NG/GT:391.5; IV Piggyback:450] Out: 650 [Urine:650]  PHYSICAL EXAMINATION: General:  Minimally responsive.  Maybe grimaces on deep sternal rub. Neuro:  CN grossly intact. No lateralizing signs noted. Supple neck. HEENT:  PERLA. (-) NVD. Unable to examine airway. ET tube in place. Cardiovascular:  Tachycardic. No S3, rub, gallop, murmur.  Lungs:  Fair air entry. Rhonchi in the right upper and mid lung zones. (-)Wheezing. Abdomen:  Dec BS. Soft abdomen. (-) masses/tenderness.  Musculoskeletal:  Gr 1 edema. Cool distal extremities.  Skin:  (-) clubbing/cyanosi/rash  LABS:  BMET  Recent Labs Lab 09/25/2015 1235 10/02/15 0545 10/03/15 0254  NA 142 148* 149*  K 3.5 3.5 2.9*  CL 108 116* 117*  CO2 24 17* 24  BUN 62* 56* 64*  CREATININE 0.85 0.75 1.06*  GLUCOSE 99 123* 185*    Electrolytes  Recent Labs Lab 10/18/2015 1235 10/02/15 0545 10/03/15 0254  CALCIUM 8.2* 7.3* 6.9*  MG  --   --  1.7  PHOS  --   --  2.7    CBC  Recent Labs Lab 10/02/15 1500 10/02/15 2000 10/03/15 0254  WBC 14.0* 13.6* 13.7*  HGB 7.8* 6.8* 7.7*  HCT 23.8* 20.0* 23.2*  PLT 67* 60* 53*    Coag's  Recent Labs Lab 09/28/15 1831 10/19/2015 1911  APTT 27  --   INR 1.07 1.27    Sepsis Markers  Recent Labs Lab 10/14/2015 1911  10/02/15  0540 10/02/15 0800 10/02/15 1735  LATICACIDVEN 4.8*  < > 10.3* 13.6* 2.6*  PROCALCITON 2.37  --   --   --   --   < > = values in this interval not displayed.  ABG  Recent Labs Lab 10/02/15 0954 10/02/15 1520 10/02/15 1700  PHART 7.180* 7.506* 7.471*  PCO2ART 37.2 29.8* 32.1  PO2ART 34.0* 317* 96.7    Liver Enzymes  Recent Labs Lab 09/29/15 0415 09/24/2015 1235 10/02/15 0545  AST 42* 47* 48*  ALT 14 14 13*  ALKPHOS 42 48 32*  BILITOT 0.5 0.8 0.5  ALBUMIN 2.4* 2.0* 1.4*    Cardiac Enzymes  Recent Labs Lab 10/05/2015 1911 10/02/15 1736 10/03/15 0254  TROPONINI 0.10* 0.36* 0.22*    Glucose  Recent Labs Lab 10/02/15 2014 10/02/15 2343 10/03/15 0327 10/03/15 0419 10/03/15 0807 10/03/15 1238  GLUCAP 222* 196* 172* 161* 153* 154*     Imaging Dg Chest Port 1 View  Result Date: 10/03/2015 CLINICAL DATA:  Respiratory failure. EXAM: PORTABLE CHEST 1 VIEW COMPARISON:  10/02/2015 FINDINGS: Tracheal tube tip is 4.1 cm above the base of the carina. Right IJ central line tip overlies the mid to distal SVC. The cardiopericardial silhouette is within normal limits for size. Right parahilar opacity persists. Telemetry leads overlie the chest. IMPRESSION: No substantial change. Electronically Signed   By: Misty Stanley M.D.   On: 10/03/2015 08:11     STUDIES:  Echo 9/9 >   CULTURES: Blood 9/9 > Trache 9/9 > MRSA 9/9 >   ANTIBIOTICS: Vanc 9/8 > Cefepime 9/8 >   SIGNIFICANT EVENTS: 9/8 admitted for possible HCAP.  9/9 intubated for respiratory failure. Transferred to ICU.  LINES/TUBES: L port R IJ 9/9 >   DISCUSSION: 35 female, history of lung cancer, recent admit for healthcare associated pneumonia and GI bleed secondary to duodenal ulcer, admitted for altered mental status, sepsis, possible healthcare associated pneumonia again. Intubated for respiratory failure on 9/9 and profoundly anemic likely from GI bleed again.   ASSESSMENT / PLAN:  PULMONARY A: Acute hypoxemic respiratory failure secondary to unable to protect airway, healthcare associated pneumonia right upper lung and right middle lung, concern for aspiration pneumonia. H/O Lung CA P:   Keep on a ventilator. Wean as tolerated Check ABG after an hour. Just intubated. Neb Treatments. Broad-spectrum antibiotics.  CARDIOVASCULAR A:  Hypovolemic shock 2/2 GI bleed (bleeding DU), component of septic shock as well.  Sinus tachycardia R/O ACS P:  tele  RENAL A:   Azotemia. Metabolic acidosis. Lactic acidosis. P:   Hb dropped again today > spoke with husband and family > they want to transfuse.   GASTROINTESTINAL A:   History of duodenal ulcer. S/P EGD on 9/6 P:   Protonix drip Need to reconsult GI in am.   HEMATOLOGIC A:   Severe  anemia. Hemorrhagic and hypovolemic shock secondary to GI bleed. P:  Transfuse packed red blood cells as ordered.  INFECTIOUS A:   HCAP, R mid-lower. Possible aspiration pneumonia. P:   Cont vanc and cefepime.  Panculture.   ENDOCRINE A:   No issues.  P:   cbg q 4 SSI  NEUROLOGIC A:   AMS likely 2/2 hypovolemic shock  P:   RASS goal: 0 - -1 Off sedation now. May need when necessary fentanyl and versed.    FAMILY  - Updates: Spoke to husband and family today.  - Inter-disciplinary family meet or Palliative Care meeting due by:  Sept 16  Critical care time  with this patient today: 35 minutes   J. Shirl Harris, MD Pulmonary and [place] Pager: [phone] After 3 pm or if no response, call [phone]  10/03/2015, 1:10 PM

## 2015-10-03 NOTE — Progress Notes (Signed)
Pharmacy Antibiotic Note  Megan Sims is a 60 y.o. female admitted on 10/23/2015 with abdominal pain and nausea.   Plan: Adjust cefepime to q12h based on renal fx Continue vanc  Temp (24hrs), Avg:98.2 F (36.8 C), Min:97.7 F (36.5 C), Max:98.6 F (37 C)   Recent Labs Lab 09/29/15 0415  09/30/15 0511 10/18/2015 1235  10/10/2015 1911 10/02/15 0000 10/02/15 0540 10/02/15 0545 10/02/15 0800 10/02/15 0847 10/02/15 1500 10/02/15 1735 10/02/15 2000 10/03/15 0254  WBC 11.8*  --  13.4* 13.4*  --   --   --   --   --  12.2* 18.7* 14.0*  --  13.6* 13.7*  CREATININE 0.86  --  0.83 0.85  --   --   --   --  0.75  --   --   --   --   --  1.06*  LATICACIDVEN  --   < >  --   --   < > 4.8* 6.9* 10.3*  --  13.6*  --   --  2.6*  --   --   < > = values in this interval not displayed.  Estimated Creatinine Clearance: 49.6 mL/min (by C-G formula based on SCr of 1.06 mg/dL).    Allergies  Allergen Reactions  . Hydrocodone-Acetaminophen Swelling    Increases secretions and sensation of throat swelling  . Demeclocycline Other (See Comments)    PILLESOPHAGITIS  . Tetracyclines & Related     PILLESOPHAGITIS  . Tramadol     SWELLING    Levester Fresh, PharmD, BCPS, [facility] Clinical Pharmacist Pager [phone] 10/03/2015 12:45 PM

## 2015-10-04 ENCOUNTER — Encounter (HOSPITAL_COMMUNITY): Admission: EM | Disposition: E | Payer: Self-pay | Source: Home / Self Care | Attending: Pulmonary Disease

## 2015-10-04 DIAGNOSIS — K922 Gastrointestinal hemorrhage, unspecified: Secondary | ICD-10-CM

## 2015-10-04 DIAGNOSIS — K269 Duodenal ulcer, unspecified as acute or chronic, without hemorrhage or perforation: Secondary | ICD-10-CM

## 2015-10-04 HISTORY — PX: ESOPHAGOGASTRODUODENOSCOPY: SHX5428

## 2015-10-04 LAB — BASIC METABOLIC PANEL
ANION GAP: 7 (ref 5–15)
Anion gap: 4 — ABNORMAL LOW (ref 5–15)
BUN: 70 mg/dL — ABNORMAL HIGH (ref 6–20)
BUN: 74 mg/dL — ABNORMAL HIGH (ref 6–20)
CALCIUM: 6.6 mg/dL — AB (ref 8.9–10.3)
CO2: 20 mmol/L — AB (ref 22–32)
CO2: 22 mmol/L (ref 22–32)
CREATININE: 1.37 mg/dL — AB (ref 0.44–1.00)
Calcium: 6.4 mg/dL — CL (ref 8.9–10.3)
Chloride: 123 mmol/L — ABNORMAL HIGH (ref 101–111)
Chloride: 124 mmol/L — ABNORMAL HIGH (ref 101–111)
Creatinine, Ser: 1.55 mg/dL — ABNORMAL HIGH (ref 0.44–1.00)
GFR calc Af Amer: 48 mL/min — ABNORMAL LOW (ref >60)
GFR, EST AFRICAN AMERICAN: 41 mL/min — AB (ref >60)
GFR, EST NON AFRICAN AMERICAN: 35 mL/min — AB (ref >60)
GFR, EST NON AFRICAN AMERICAN: 41 mL/min — AB (ref >60)
Glucose, Bld: 119 mg/dL — ABNORMAL HIGH (ref 65–99)
Glucose, Bld: 152 mg/dL — ABNORMAL HIGH (ref 65–99)
Potassium: 4.1 mmol/L (ref 3.5–5.1)
Potassium: 4.6 mmol/L (ref 3.5–5.1)
SODIUM: 149 mmol/L — AB (ref 135–145)
Sodium: 151 mmol/L — ABNORMAL HIGH (ref 135–145)

## 2015-10-04 LAB — CBC WITH DIFFERENTIAL/PLATELET
BASOS PCT: 1 %
Basophils Absolute: 0.2 10*3/uL — ABNORMAL HIGH (ref 0.0–0.1)
Eosinophils Absolute: 0 10*3/uL (ref 0.0–0.7)
Eosinophils Relative: 0 %
HCT: 26.3 % — ABNORMAL LOW (ref 36.0–46.0)
HEMOGLOBIN: 9.1 g/dL — AB (ref 12.0–15.0)
LYMPHS ABS: 0.5 10*3/uL — AB (ref 0.7–4.0)
LYMPHS PCT: 3 %
MCH: 29.8 pg (ref 26.0–34.0)
MCHC: 34.6 g/dL (ref 30.0–36.0)
MCV: 86.2 fL (ref 78.0–100.0)
MONOS PCT: 8 %
Monocytes Absolute: 1.4 10*3/uL — ABNORMAL HIGH (ref 0.1–1.0)
NEUTROS ABS: 15.6 10*3/uL — AB (ref 1.7–7.7)
Neutrophils Relative %: 88 %
Platelets: 115 10*3/uL — ABNORMAL LOW (ref 150–400)
RBC: 3.05 MIL/uL — ABNORMAL LOW (ref 3.87–5.11)
RDW: 14.1 % (ref 11.5–15.5)
WBC: 17.7 10*3/uL — ABNORMAL HIGH (ref 4.0–10.5)

## 2015-10-04 LAB — POCT I-STAT 3, ART BLOOD GAS (G3+)
ACID-BASE DEFICIT: 13 mmol/L — AB (ref 0.0–2.0)
BICARBONATE: 13.9 mmol/L — AB (ref 20.0–28.0)
O2 SAT: 53 %
PO2 ART: 34 mmHg — AB (ref 83.0–108.0)
TCO2: 15 mmol/L (ref 0–100)
pCO2 arterial: 37.2 mmHg (ref 32.0–48.0)
pH, Arterial: 7.18 — CL (ref 7.350–7.450)

## 2015-10-04 LAB — CULTURE, RESPIRATORY: CULTURE: NORMAL

## 2015-10-04 LAB — HEMOGLOBIN AND HEMATOCRIT, BLOOD
HCT: 15.8 % — ABNORMAL LOW (ref 36.0–46.0)
HCT: 24.3 % — ABNORMAL LOW (ref 36.0–46.0)
HEMOGLOBIN: 8.4 g/dL — AB (ref 12.0–15.0)
Hemoglobin: 5.4 g/dL — CL (ref 12.0–15.0)

## 2015-10-04 LAB — MAGNESIUM
Magnesium: 2 mg/dL (ref 1.7–2.4)
Magnesium: 2 mg/dL (ref 1.7–2.4)

## 2015-10-04 LAB — LEGIONELLA PNEUMOPHILA SEROGP 1 UR AG: L. PNEUMOPHILA SEROGP 1 UR AG: NEGATIVE

## 2015-10-04 LAB — PHOSPHORUS: PHOSPHORUS: 3.6 mg/dL (ref 2.5–4.6)

## 2015-10-04 LAB — GLUCOSE, CAPILLARY
GLUCOSE-CAPILLARY: 117 mg/dL — AB (ref 65–99)
GLUCOSE-CAPILLARY: 112 mg/dL — AB (ref 65–99)
GLUCOSE-CAPILLARY: 103 mg/dL — AB (ref 65–99)
Glucose-Capillary: 164 mg/dL — ABNORMAL HIGH (ref 65–99)

## 2015-10-04 LAB — CBC
HCT: 20.9 % — ABNORMAL LOW (ref 36.0–46.0)
Hemoglobin: 7 g/dL — ABNORMAL LOW (ref 12.0–15.0)
MCH: 29.3 pg (ref 26.0–34.0)
MCHC: 33.5 g/dL (ref 30.0–36.0)
MCV: 87.4 fL (ref 78.0–100.0)
PLATELETS: 35 10*3/uL — AB (ref 150–400)
RBC: 2.39 MIL/uL — AB (ref 3.87–5.11)
RDW: 15.4 % (ref 11.5–15.5)
WBC: 16 10*3/uL — AB (ref 4.0–10.5)

## 2015-10-04 LAB — PREPARE RBC (CROSSMATCH)

## 2015-10-04 LAB — CULTURE, RESPIRATORY W GRAM STAIN

## 2015-10-04 SURGERY — EGD (ESOPHAGOGASTRODUODENOSCOPY)
Anesthesia: Moderate Sedation

## 2015-10-04 MED ORDER — DEXTROSE 5 % IV SOLN
1.0000 g | INTRAVENOUS | Status: AC
  Administered 2015-10-05 – 2015-10-08 (×4): 1 g via INTRAVENOUS
  Filled 2015-10-04 (×4): qty 1

## 2015-10-04 MED ORDER — DIPHENHYDRAMINE HCL 50 MG/ML IJ SOLN
INTRAMUSCULAR | Status: DC | PRN
  Administered 2015-10-04: 25 mg via INTRAVENOUS

## 2015-10-04 MED ORDER — HEPARIN SOD (PORK) LOCK FLUSH 100 UNIT/ML IV SOLN
500.0000 [IU] | INTRAVENOUS | Status: AC | PRN
  Administered 2015-10-04: 500 [IU]

## 2015-10-04 MED ORDER — FENTANYL CITRATE (PF) 100 MCG/2ML IJ SOLN
INTRAMUSCULAR | Status: DC | PRN
  Administered 2015-10-04: 25 ug via INTRAVENOUS

## 2015-10-04 MED ORDER — SODIUM CHLORIDE 0.9 % IV SOLN: Freq: Once | INTRAVENOUS | Status: AC

## 2015-10-04 MED ORDER — SODIUM CHLORIDE 0.9 % IV SOLN: INTRAVENOUS | Status: DC

## 2015-10-04 MED ORDER — MIDAZOLAM HCL 10 MG/2ML IJ SOLN
INTRAMUSCULAR | Status: DC | PRN
  Administered 2015-10-04 (×2): 2 mg via INTRAVENOUS

## 2015-10-04 MED ORDER — SODIUM CHLORIDE 0.9 % IJ SOLN
PREFILLED_SYRINGE | INTRAMUSCULAR | Status: DC | PRN
  Administered 2015-10-04: 5 mL

## 2015-10-04 MED ORDER — PANTOPRAZOLE SODIUM 40 MG IV SOLR
40.0000 mg | Freq: Two times a day (BID) | INTRAVENOUS | Status: DC
  Administered 2015-10-04 – 2015-10-16 (×24): 40 mg via INTRAVENOUS
  Filled 2015-10-04 (×27): qty 40

## 2015-10-04 MED ORDER — MIDAZOLAM HCL 5 MG/ML IJ SOLN
INTRAMUSCULAR | Status: AC
  Filled 2015-10-04: qty 2

## 2015-10-04 MED ORDER — DEXTROSE 5 % IV SOLN
INTRAVENOUS | Status: DC
  Administered 2015-10-04 – 2015-10-05 (×2): via INTRAVENOUS

## 2015-10-04 MED ORDER — SPOT INK MARKER SYRINGE KIT
PACK | SUBMUCOSAL | Status: AC
  Filled 2015-10-04: qty 5

## 2015-10-04 MED ORDER — FENTANYL CITRATE (PF) 100 MCG/2ML IJ SOLN
INTRAMUSCULAR | Status: AC
  Filled 2015-10-04: qty 4

## 2015-10-04 MED ORDER — DIPHENHYDRAMINE HCL 50 MG/ML IJ SOLN
INTRAMUSCULAR | Status: AC
  Filled 2015-10-04: qty 1

## 2015-10-04 MED ORDER — EPINEPHRINE HCL 0.1 MG/ML IJ SOSY
PREFILLED_SYRINGE | INTRAMUSCULAR | Status: AC
  Filled 2015-10-04: qty 10

## 2015-10-04 MED ORDER — SODIUM CHLORIDE 0.9 % IV SOLN: Freq: Once | INTRAVENOUS | Status: DC

## 2015-10-04 NOTE — Progress Notes (Signed)
eLink Physician-Brief Progress Note Patient Name: Megan Sims DOB: 1955-12-30 MRN: [id number]   Date of Service  10/20/2015  HPI/Events of Note  CBC Latest Ref Rng & Units 09/24/2015 10/03/2015 10/03/2015  WBC 4.0 - 10.5 K/uL PENDING 14.4(H) 16.7(H)  Hemoglobin 12.0 - 15.0 g/dL 5.5(LL) 7.0(L) 4.7(LL)  Hematocrit 36.0 - 46.0 % 16.5(L) 21.2(L) 14.0(L)  Platelets 150 - 400 K/uL 35(L) 38(L) 48(L)     eICU Interventions  Transfuse PRBC.     Intervention Category Major Interventions: Other:  Chattie Greeson 10/12/2015, 3:39 AM

## 2015-10-04 NOTE — Progress Notes (Signed)
CRITICAL VALUE ALERT  Critical value received:  Calcium 6.4  Date of notification:  10/13/2015  Time of notification:  6226  Critical value read back:Yes.    Nurse who received alert:  Vic Blackbird RN  MD notified (1st page):  eLink MD  Time of first page: [phone]

## 2015-10-04 NOTE — Progress Notes (Signed)
Pharmacy Antibiotic Note  Megan Sims is a 60 y.o. female admitted on 10/09/2015 with pneumonia.  Pharmacy has been consulted for vanc/cefepime dosing. Currently afebrile. Creatinine has doubled over last 48 hours. Decrease dose of cefepime and hold vancomycin for now. Missed window for trough level by 4 hours, accounting for 33% of the interval. Random level in process. If therapeutic or slightly subtherapeutic, then switch to 24 hour dosing. If supratherapeutic, then draw second random level and calculate patient specific kinetics.   Plan: Cefepime 1g IV q24 hours Hold vancomycin Vanc random stat  Adjust dose based on level Monitor s/sx of clinical improvement, LOT, and ability to de-escalate   Height: '5\' 1"'$  (154.9 cm) Weight: 146 lb 13.2 oz (66.6 kg) IBW/kg (Calculated) : 47.8  Temp (24hrs), Avg:97.6 F (36.4 C), Min:96.7 F (35.9 C), Max:98.4 F (36.9 C)   Recent Labs Lab 10/04/2015 1235  10/02/2015 1911 10/02/15 0000 10/02/15 0540 10/02/15 0545 10/02/15 0800  10/02/15 1735  10/03/15 0254 10/03/15 1537 10/03/15 2224 10/03/15 2345 10/21/2015 0248 10/02/2015 0500 10/05/2015 0745  WBC 13.4*  --   --   --   --   --  12.2*  < >  --   < > 13.7* 16.7* 14.4*  --  15.5*  --  16.0*  CREATININE 0.85  --   --   --   --  0.75  --   --   --   --  1.06*  --   --  1.37*  --  1.55*  --   LATICACIDVEN  --   < > 4.8* 6.9* 10.3*  --  13.6*  --  2.6*  --   --   --   --   --   --   --   --   < > = values in this interval not displayed.  Estimated Creatinine Clearance: 33.7 mL/min (by C-G formula based on SCr of 1.55 mg/dL).    Allergies  Allergen Reactions  . Hydrocodone-Acetaminophen Swelling    Increases secretions and sensation of throat swelling  . Demeclocycline Other (See Comments)    PILLESOPHAGITIS  . Tetracyclines & Related     PILLESOPHAGITIS  . Tramadol     SWELLING    Cefepime 9/8>> Vancomycin 9/8>>  9/8 Urine: suggest recollection. 9/8 Blood x 2: NGTD x 2 days 9/9 Resp:  Few gram variable rods, rare gram positive cocci in pairs, consistent with normal respiratory flora.   Thank you for allowing pharmacy to be a part of this patient's care.  Dierdre Harness, Cain Sieve, PharmD Clinical Pharmacy Resident [phone] (Pager) 09/27/2015 4:06 PM

## 2015-10-04 NOTE — Progress Notes (Addendum)
PULMONARY / CRITICAL CARE MEDICINE   Name: Megan Sims MRN: [id number] DOB: September 24, 1955    ADMISSION DATE:  09/25/2015 CONSULTATION DATE:  10/02/2015  REFERRING MD:  [facility] Dr. Erlinda Hong  CHIEF COMPLAINT:  Unresponsiveness. Hypovolemic shock  HISTORY OF PRESENT ILLNESS:    Pt is a 60 y.o. female with medical history significant for Lung cancer, not currently on treatment, history of dermatomyositis, CHF,and a recent admission for GI bleed due to duodenal ulcer on PPI (H pylori by US biopsy pending) from 9/5 through 09/30/2015,at which time, she was diagnosed with HCAP, sent home with Levaquin, failing to take the medicine, now presenting with worsening respiratory symptoms.she reports increasing shortness of breath, increased mucus production in the setting of chronic hemoptysis, as well as generalized weakness. The patient denies fevers, chills or diaphoresis. She denies any headaches. She reports chest pain with cough, but no cardiac complaints. She denies any nausea vomiting or diarrhea. She denies any dysuria or gross hematuria.  At the emergency room, she was hemodynamically stable. Her hemoglobin was 8.4. She was started on broad-spectrum antibiotics. She received 2 L of IV fluids for possible sepsis. She was transferred to the floors. She had 2-3 large "dark colored" bowel movements overnight. This morning, the nurse walked in and patient was unresponsive. She was intubated for airway protection. Transferred to the ICU. Her hemoglobin dropped to 3.5 from 8.4.   SUBJECTIVE:  Continues to have BRBPR and transfuse 3 u. Decreasing uo.   VITAL SIGNS: BP 102/61   Pulse (!) 117   Temp 97.6 F (36.4 C) (Oral)   Resp 16   Ht '5\' 1"'$  (1.549 m)   Wt 66.6 kg (146 lb 13.2 oz)   SpO2 99%   BMI 27.74 kg/m   HEMODYNAMICS: CVP:  [0 mmHg-6 mmHg] 3 mmHg  VENTILATOR SETTINGS: Vent Mode: PSV;CPAP FiO2 (%):  [40 %] 40 % Set Rate:  [15 bmp] 15 bmp Vt Set:  [390 mL] 390 mL PEEP:  [5 cmH20] 5 cmH20 Pressure  Support:  [5 cmH20-8 cmH20] 5 cmH20 Plateau Pressure:  [15 cmH20] 15 cmH20  INTAKE / OUTPUT: I/O last 3 completed shifts: In: 23762 [I.V.:4920.1; GBTDV:7616; NG/GT:8030.8; IV Piggyback:400] Out: 373 [Urine:373]  PHYSICAL EXAMINATION: General:  Arousable. Answers simple questions.  Neuro:  CN grossly intact. No lateralizing signs noted. Supple neck. HEENT:  PERLA. (-) NVD. Unable to examine airway. ET tube in place. Cardiovascular:  Tachycardic. No S3, rub, gallop, murmur.  Lungs:  Fair air entry. Rhonchi in the right upper and mid lung zones. (-)Wheezing. Abdomen:  Dec BS. Soft abdomen. (-) masses/tenderness.  Musculoskeletal:  Gr 2 edema. Cool distal extremities.  Skin:  (-) clubbing/cyanosi/rash  LABS:  BMET  Recent Labs Lab 10/03/15 0254 10/03/15 2345 10/21/2015 0500  NA 149* 149* 151*  K 2.9* 4.1 4.6  CL 117* 123* 124*  CO2 24 22 20*  BUN 64* 70* 74*  CREATININE 1.06* 1.37* 1.55*  GLUCOSE 185* 152* 119*    Electrolytes  Recent Labs Lab 10/03/15 0254 10/03/15 2345 10/22/2015 0500  CALCIUM 6.9* 6.4* 6.6*  MG 1.7 2.0 2.0  PHOS 2.7  --  3.6    CBC  Recent Labs Lab 10/03/15 2224 10/10/2015 0248 10/23/2015 0745  WBC 14.4* 15.5* 16.0*  HGB 7.0* 5.5* 7.0*  HCT 21.2* 16.5* 20.9*  PLT 38* 35* PENDING    Coag's  Recent Labs Lab 09/28/15 1831 10/23/2015 1911  APTT 27  --   INR 1.07 1.27    Sepsis Markers  Recent Labs Lab 10/03/2015 1911  10/02/15 0540 10/02/15 0800 10/02/15 1735  LATICACIDVEN 4.8*  < > 10.3* 13.6* 2.6*  PROCALCITON 2.37  --   --   --   --   < > = values in this interval not displayed.  ABG  Recent Labs Lab 10/02/15 0954 10/02/15 1520 10/02/15 1700  PHART 7.180* 7.506* 7.471*  PCO2ART 37.2 29.8* 32.1  PO2ART 34.0* 317* 96.7    Liver Enzymes  Recent Labs Lab 09/29/15 0415 10/18/2015 1235 10/02/15 0545  AST 42* 47* 48*  ALT 14 14 13*  ALKPHOS 42 48 32*  BILITOT 0.5 0.8 0.5  ALBUMIN 2.4* 2.0* 1.4*    Cardiac  Enzymes  Recent Labs Lab 10/21/2015 1911 10/02/15 1736 10/03/15 0254  TROPONINI 0.10* 0.36* 0.22*    Glucose  Recent Labs Lab 10/03/15 1238 10/03/15 1647 10/03/15 1941 10/03/15 2359 10/09/2015 0402 10/17/2015 0806  GLUCAP 154* 193* 151* 152* 117* 112*    Imaging No results found.   STUDIES:  Echo 9/9 >   CULTURES: Blood 9/9 > (-) Trache 9/9 > MRSA 9/9 > (-)  ANTIBIOTICS: Vanc 9/8 > 9/11 Cefepime 9/8 >   SIGNIFICANT EVENTS: 9/8 admitted for possible HCAP.  9/9 intubated for respiratory failure. Transferred to ICU.  LINES/TUBES: L port R IJ 9/9 >   DISCUSSION: 54 female, history of lung cancer, recent admit for healthcare associated pneumonia and GI bleed secondary to duodenal ulcer, admitted for altered mental status, sepsis, possible healthcare associated pneumonia again. Intubated for respiratory failure on 9/9 and profoundly anemic likely from GI bleed again.   ASSESSMENT / PLAN:  PULMONARY A: Acute hypoxemic respiratory failure secondary to unable to protect airway, healthcare associated pneumonia right upper lung and right middle lung, concern for aspiration pneumonia. H/O Lung CA P:   Keep on a ventilator. Wean as tolerated Neb Treatments. Keep cefepime, d/c vanc  CARDIOVASCULAR A:  Hypovolemic shock 2/2 GI bleed (bleeding DU) Sinus tachycardia P:  tele  RENAL A:   AKI 2/2 Hypovolemia Metabolic acidosis. Lactic acidosis. Now with hypernatremia and hyperchloremia P:   Hb at 7 after 3 upRBC from last 24 hrs.  Actively bleeding. Per GI Dr. Collene Mares, nothing more can be done. Will touch base with her.  Stop NS and start D5w at 50 mls/hr. Has gr 2 edema.   GASTROINTESTINAL A:   History of duodenal ulcer. Active GI bleed S/P EGD on 9/6 P:   Protonix drip Hb at 7 after 3 upRBC last 24 hrs. Actively bleeding. Per GI Dr. Collene Mares, nothing more can be done. Will touch base with her.  HEMATOLOGIC A:   Severe anemia. Hemorrhagic and hypovolemic  shock secondary to GI bleed. P:  Transfuse packed red blood cells as ordered.  Family insisted to transfuse blood.   INFECTIOUS A:   HCAP, R mid-lower. Possible aspiration pneumonia. P:   Cont cefepime.  Panculture f/u  ENDOCRINE A:   No issues.  P:   cbg q 4 SSI  NEUROLOGIC A:   AMS / Encephalopathy P:   RASS goal: 0 - -1 PRN fentanyl.    FAMILY  - Updates: need to discuss goals of care with family.  - Inter-disciplinary family meet or Palliative Care meeting due by:  Sept 16  Critical care time with this patient today: 35 minutes   J. Shirl Harris, MD [facility] [place] Pager: [phone] After 3 pm or if no response, call [phone]  10/05/2015, 9:56  AM  Addendum :  I had an extensive family meeting with husband and all children.  They had a lot of questions regarding the duodenal ulcer and whether anything else can be done.  Per Dr. Collene Mares, nothing else can be done further.  I mentioned the next step likely is surgery but Dr. Collene Mares is not even recommending that.  Given her over all critical condition right now and prognosis, she will be high risk for complications with surgery if at all she goes to surgery and survives it.  Family is adamant that we transfuse her blood. Will cont present management and keep her DNR.  They wanted another GI opinion and I spoke with the Maryanna Shape GI PA.  Awaiting input from [place], MD 10/05/2015, 12:48 PM [facility] Pager [phone] After 3 pm or if no answer, call [phone]

## 2015-10-04 NOTE — Progress Notes (Signed)
Daily Rounding Note  10/03/2015, 1:38 PM  LOS: 3 days   GI MD is Dr Barney Drain  SUBJECTIVE:   Chief complaint: gi bleed, anemia.  Dr Corrie Dandy request GI re-involvement at family request to see if there is anything GI can offer pt.   Admitted 4 days ago with fevers, vent requiring hypoxia/resp failure/HCPNA.  AMS. Shock, felt 2/2 to GI bleeding.   Over weekend pt made DNR and mostly comfort care but she is getting transfused for Hgb drop to 4.7.  6 PRBCs to date.  Went from 7 to 5.4 over 5 hours today.   Also has not been extubated.  Family says stools were black, dark PTA but stools in last 24 hours are red, bloody more c/w a lower GI bleed.  RN aspirated on the PEG tube today and no blood was seen. Pt also c/o pain throughout abdomen, but mostly in lower mid abdomen.   Platelets low in 30s 07/07/15 EGD and PEG placement by Dr Oneida Alar.: mild gastritis, one non-bleeding DU without stigmata, duodentis.  Pathology: chronic gastritis, no H Pylori.  Dysphagia related to chest radiation for lung cancer.  09/29/15 EGD for black stools and 4 gm Hgb decline. Dr Henrene Pastor found mild esophagitis, large calibre distal esophageal stricture, non-bleeding 12 mm cratered bulbar ulcer.  Urease (clotest): negative.  Had colonoscopy ~ 2010 in [place].   No advanced GI imaging such as CT, MRI, ultrasound.    OBJECTIVE:         Vital signs in last 24 hours:    Temp:  [96.7 F (35.9 C)-98.4 F (36.9 C)] 98.1 F (36.7 C) (09/11 1124) Pulse Rate:  [99-124] 106 (09/11 1150) Resp:  [10-37] 17 (09/11 1150) BP: (85-176)/(48-98) 96/62 (09/11 1150) SpO2:  [96 %-100 %] 100 % (09/11 1150) FiO2 (%):  [40 %] 40 % (09/11 1150) Weight:  [66.6 kg (146 lb 13.2 oz)] 66.6 kg (146 lb 13.2 oz) (09/11 0500) Last BM Date: 10/02/15 Filed Weights   10/03/15 0230 10/03/15 0758 10/12/2015 0500  Weight: 63 kg (138 lb 14.2 oz) 67.5 kg (148 lb 13 oz) 66.6 kg (146 lb 13.2 oz)    General: acutely and chronically ill looking AAF.   Quite frail.  Intubated but alert   Heart: sinus tachy, regular in 120s.   Chest: diminished BS, on vent Abdomen: soft.  BS absent.  Mild distention.  Tender bil in mid to lower abdomen.  No guard or rebound  Extremities: swelling in all 4 extremeties Neuro/Psych:  Awake, follows commands.  Moves all 4s.  Tremors in feet.   Intake/Output from previous day: 09/10 0701 - 09/11 0700 In: 12433.8 [I.V.:3125; Blood:1028; NG/GT:8030.8; IV Piggyback:250] Out: 173 [Urine:173]  Intake/Output this shift: Total I/O In: 500 [I.V.:500] Out: 17 [Urine:17]  Lab Results:  Recent Labs  10/03/15 2224 10/14/2015 0248 10/23/2015 0745 09/24/2015 1245  WBC 14.4* 15.5* 16.0*  --   HGB 7.0* 5.5* 7.0* 5.4*  HCT 21.2* 16.5* 20.9* 15.8*  PLT 38* 35* 35*  --    BMET  Recent Labs  10/03/15 0254 10/03/15 2345 10/05/2015 0500  NA 149* 149* 151*  K 2.9* 4.1 4.6  CL 117* 123* 124*  CO2 24 22 20*  GLUCOSE 185* 152* 119*  BUN 64* 70* 74*  CREATININE 1.06* 1.37* 1.55*  CALCIUM 6.9* 6.4* 6.6*   LFT  Recent Labs  10/02/15 0545  PROT 3.2*  ALBUMIN 1.4*  AST 48*  ALT 13*  ALKPHOS 32*  BILITOT 0.5   PT/INR  Recent Labs  10/08/2015 1911  LABPROT 16.0*  INR 1.27   Hepatitis Panel No results for input(s): HEPBSAG, HCVAB, HEPAIGM, HEPBIGM in the last 72 hours.  Studies/Results: Dg Chest Port 1 View  Result Date: 10/03/2015 CLINICAL DATA:  Respiratory failure. EXAM: PORTABLE CHEST 1 VIEW COMPARISON:  10/02/2015 FINDINGS: Tracheal tube tip is 4.1 cm above the base of the carina. Right IJ central line tip overlies the mid to distal SVC. The cardiopericardial silhouette is within normal limits for size. Right parahilar opacity persists. Telemetry leads overlie the chest. IMPRESSION: No substantial change. Electronically Signed   By: Misty Stanley M.D.   On: 10/03/2015 08:11   Scheduled Meds: . sodium chloride   Intravenous Once  . budesonide  (PULMICORT) nebulizer solution  0.5 mg Nebulization BID  . ceFEPime (MAXIPIME) IV  1 g Intravenous Q12H  . chlorhexidine  15 mL Mouth Rinse BID  . hydrocortisone sod succinate (SOLU-CORTEF) inj  100 mg Intravenous Q6H  . insulin aspart  0-9 Units Subcutaneous Q4H  . ipratropium-albuterol  3 mL Nebulization QID  . mouth rinse  15 mL Mouth Rinse QID  . pantoprazole  40 mg Intravenous Q12H  . sodium chloride  1,000 mL Intravenous Once   And  . sodium chloride  500 mL Intravenous Once   And  . sodium chloride  250 mL Intravenous Once   Continuous Infusions: . dextrose    . norepinephrine (LEVOPHED) Adult infusion Stopped (10/03/15 0355)   PRN Meds:.fentaNYL (SUBLIMAZE) injection, midazolam, midazolam, morphine CONCENTRATE, sodium chloride flush, white petrolatum    ASSESMENT:    *  Acute on chronic anemia due to blood loss  *  GIB.  On the 2 EGDs this year findings of non-bleeding bulbar ulcer, esophagitis, gastritis.   On empiric PPI drip.    Last colonoscopy ~ 2010 in [place].   *  Thrombocytopenia, acute on chronic.  Likely due to consumption per 9/9 hematology note.   *  Shock, ? Hypovolemic due to GI bleeding and/or sepsis from PNA (aspiration suspected)  *  AKI  *  PEG placed due to dysphagia aspiration 06/2015.   *   Hx bil lung cancer.  Reading oncology note of 9/9, no supicion for tumor recurrence.   *  Dermatomyositis.  Chronic steroids. Solu-Cortef in place currently. Confers very poor prognosis per Dr Alvy Bimler.   *  Pagets disease.    PLAN   *  Switching to BID IV Protonix.   *  ?  Repeat EGD vs enteroscopy vs colonoscopy.  Dr Havery Moros will be rounding on pt today.  If colonoscopy pursued, will prep via PEG.  Spoke with pt, husband and family who are ok with any GI interventions necessary.    *  Dr Alvy Bimler rec is for transition to comfort care.  Currently is DNR but not comfort care.   *  2 more PRBCs ordered just as I type this.      Azucena Freed   10/12/2015, 1:38 PM Pager: [phone]

## 2015-10-04 NOTE — Op Note (Signed)
[facility] Patient Name: Megan Sims Procedure Date : 10/02/2015 MRN: [id number] Attending MD: Carlota Raspberry. Havery Moros , MD Date of Birth: 07/12/1955 CSN: [id number] Age: 60 Admit Type: Inpatient Procedure:                Upper GI endoscopy Indications:              Active gastrointestinal bleeding, history of                            duodenal ulcer Providers:                Remo Lipps P. Havery Moros, MD, Kingsley Plan, RN,                            Cherylynn Ridges, Technician Referring MD:              Medicines:                Fentanyl 50 micrograms IV, Midazolam 4 mg IV,                            Diphenhydramine 25 mg IV Complications:            No immediate complications. Estimated blood loss:                            Minimal. Estimated Blood Loss:     Estimated blood loss was minimal. Procedure:                Pre-Anesthesia Assessment:                           - Prior to the procedure, a History and Physical                            was performed, and patient medications and                            allergies were reviewed. The patient's tolerance of                            previous anesthesia was also reviewed. The risks                            and benefits of the procedure and the sedation                            options and risks were discussed with the patient.                            All questions were answered, and informed consent                            was obtained. Prior Anticoagulants: The patient has                            taken  no previous anticoagulant or antiplatelet                            agents. ASA Grade Assessment: III - A patient with                            severe systemic disease. After reviewing the risks                            and benefits, the patient was deemed in                            satisfactory condition to undergo the procedure.                           After obtaining informed consent, the  endoscope was                            passed under direct vision. Throughout the                            procedure, the patient's blood pressure, pulse, and                            oxygen saturations were monitored continuously. The                            EG-[id number] ([id number]) scope was introduced through the                            mouth, and advanced to the second part of duodenum.                            The upper GI endoscopy was accomplished without                            difficulty. The patient tolerated the procedure                            well. Scope In: Scope Out: Findings:      Esophagogastric landmarks were identified: the Z-line was found at 39       cm, the gastroesophageal junction was found at 39 cm and the upper       extent of the gastric folds was found at 39 cm from the incisors.      The exam of the esophagus was otherwise normal.      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 This       was characterized by healthy appearing mucosa and PEG was in appropriate       place.      Patchy moderate inflammation characterized by erythema, friability and       linear erosions was found in the gastric body and in the gastric antrum.       No gastric ulcerations were noted.  Red blood was noted in the antrum and pylorus, the exam of the stomach       was otherwise normal.      One non-bleeding cratered duodenal ulcer with pigmented material was       found in the duodenal bulb. The lesion was 12 mm in largest dimension.       This was monitored for several minutes and no active bleeding       appreciated.      Red blood was found in the duodenal bulb and in the second portion of       the duodenum.      The duodenal bulb was extensively lavaged, active bleeding appeared to       be coming from the duodenal bulb on the opposite wall from the ulcer.       The area was successfully injected with of a 1:10,000 solution of        epinephrine for hemostasis. Following injection, there appeared to be a       prominent vessel which was suspected to be the source of the bleeding. 2       hemostasis clips were placed in this area. One area in which epinephrine       had been injected also had some oozing (suspect due to       thrombocytopenia), and an additional hemostasis clip was appliced. There       was no bleeding at the end of the procedure. Impression:               - Esophagogastric landmarks identified.                           - Esophagus otherwise normal                           - Gastrostomy present characterized by healthy                            appearing mucosa.                           - Gastritis.                           - Blood in distal stomach, duodenal bulb, and                            second portion duodenum                           - One non-bleeding duodenal ulcer with pigmented                            material, which did not appear to be actively                            bleeding. Given other pathology noted actively                            bleeding, no intervention was taken to treat the  ulcer                           - Suspected prominent blood vessel in duodenal                            bulb, treated with epinephrine and hemostasis clips                            as outlined above, which appeared to achieve                            hemostasis. This appeared to be the source of the                            patient's recent bleeding. Moderate Sedation:      Moderate (conscious) sedation was administered by the endoscopy nurse       and supervised by the endoscopist. The following parameters were       monitored: oxygen saturation, heart rate, blood pressure, and response       to care. Total physician intraservice time was 23 minutes. Recommendation:           - Remain in ICU for ongoing care.                           - NPO.                            - Continue IV protonix                           - Transfuse to goal Hgb, trend CBC and monitor for                            recurrence of bleeding                           - Await response to platelet transfusion. Transfuse                            additional platelets if below 50K and patient has                            symptoms of rebleeding                           - Contact us in case of rebleeding, will consider                            IR consult for intervention in this setting Procedure Code(s):        --- Professional ---                           43255, Esophagogastroduodenoscopy, flexible,  transoral; with control of bleeding, any method                           99152, Moderate sedation services provided by the                            same physician or other qualified health care                            professional performing the diagnostic or                            therapeutic service that the sedation supports,                            requiring the presence of an independent trained                            observer to assist in the monitoring of the                            patient's level of consciousness and physiological                            status; initial 15 minutes of intraservice time,                            patient age 24 years or older                           [phone], Moderate sedation services; each additional                            15 minutes intraservice time Diagnosis Code(s):        --- Professional ---                           Z93.1, Gastrostomy status                           K29.70, Gastritis, unspecified, without bleeding                           K26.9, Duodenal ulcer, unspecified as acute or                            chronic, without hemorrhage or perforation                           K92.2, Gastrointestinal hemorrhage, unspecified                           K31.89,  Other diseases of stomach and duodenum CPT copyright 2016 [employer]. All rights reserved. The codes documented in this report are preliminary and upon coder review may  be revised to meet current  compliance requirements. Remo Lipps P. Armbruster, MD 10/10/2015 3:30:10 PM This report has been signed electronically. Number of Addenda: 0

## 2015-10-04 NOTE — Progress Notes (Signed)
CRITICAL VALUE ALERT  Critical value received:  hgb 5.5  Date of notification:  10/13/2015  Time of notification:  0333  Critical value read back:Yes.    Nurse who received alert:  Vic Blackbird RN  MD notified (1st page):  eLink MD  Time of first page:  [phone]  Responding MD:  Halford Chessman, MD  Time MD responded:  [phone]

## 2015-10-04 NOTE — H&P (View-Only) (Signed)
Daily Rounding Note  10/18/2015, 1:38 PM  LOS: 3 days   GI MD is Dr Barney Drain  SUBJECTIVE:   Chief complaint: gi bleed, anemia.  Dr Corrie Dandy request GI re-involvement at family request to see if there is anything GI can offer pt.   Admitted 4 days ago with fevers, vent requiring hypoxia/resp failure/HCPNA.  AMS. Shock, felt 2/2 to GI bleeding.   Over weekend pt made DNR and mostly comfort care but she is getting transfused for Hgb drop to 4.7.  6 PRBCs to date.  Went from 7 to 5.4 over 5 hours today.   Also has not been extubated.  Family says stools were black, dark PTA but stools in last 24 hours are red, bloody more c/w a lower GI bleed.  RN aspirated on the PEG tube today and no blood was seen. Pt also c/o pain throughout abdomen, but mostly in lower mid abdomen.   Platelets low in 30s 07/07/15 EGD and PEG placement by Dr Oneida Alar.: mild gastritis, one non-bleeding DU without stigmata, duodentis.  Pathology: chronic gastritis, no H Pylori.  Dysphagia related to chest radiation for lung cancer.  09/29/15 EGD for black stools and 4 gm Hgb decline. Dr Henrene Pastor found mild esophagitis, large calibre distal esophageal stricture, non-bleeding 12 mm cratered bulbar ulcer.  Urease (clotest): negative.  Had colonoscopy ~ 2010 in [place].   No advanced GI imaging such as CT, MRI, ultrasound.    OBJECTIVE:         Vital signs in last 24 hours:    Temp:  [96.7 F (35.9 C)-98.4 F (36.9 C)] 98.1 F (36.7 C) (09/11 1124) Pulse Rate:  [99-124] 106 (09/11 1150) Resp:  [10-37] 17 (09/11 1150) BP: (85-176)/(48-98) 96/62 (09/11 1150) SpO2:  [96 %-100 %] 100 % (09/11 1150) FiO2 (%):  [40 %] 40 % (09/11 1150) Weight:  [66.6 kg (146 lb 13.2 oz)] 66.6 kg (146 lb 13.2 oz) (09/11 0500) Last BM Date: 10/02/15 Filed Weights   10/03/15 0230 10/03/15 0758 09/30/2015 0500  Weight: 63 kg (138 lb 14.2 oz) 67.5 kg (148 lb 13 oz) 66.6 kg (146 lb 13.2 oz)    General: acutely and chronically ill looking AAF.   Quite frail.  Intubated but alert   Heart: sinus tachy, regular in 120s.   Chest: diminished BS, on vent Abdomen: soft.  BS absent.  Mild distention.  Tender bil in mid to lower abdomen.  No guard or rebound  Extremities: swelling in all 4 extremeties Neuro/Psych:  Awake, follows commands.  Moves all 4s.  Tremors in feet.   Intake/Output from previous day: 09/10 0701 - 09/11 0700 In: 12433.8 [I.V.:3125; Blood:1028; NG/GT:8030.8; IV Piggyback:250] Out: 173 [Urine:173]  Intake/Output this shift: Total I/O In: 500 [I.V.:500] Out: 17 [Urine:17]  Lab Results:  Recent Labs  10/03/15 2224 09/30/2015 0248 10/23/2015 0745 10/23/2015 1245  WBC 14.4* 15.5* 16.0*  --   HGB 7.0* 5.5* 7.0* 5.4*  HCT 21.2* 16.5* 20.9* 15.8*  PLT 38* 35* 35*  --    BMET  Recent Labs  10/03/15 0254 10/03/15 2345 10/04/2015 0500  NA 149* 149* 151*  K 2.9* 4.1 4.6  CL 117* 123* 124*  CO2 24 22 20*  GLUCOSE 185* 152* 119*  BUN 64* 70* 74*  CREATININE 1.06* 1.37* 1.55*  CALCIUM 6.9* 6.4* 6.6*   LFT  Recent Labs  10/02/15 0545  PROT 3.2*  ALBUMIN 1.4*  AST 48*  ALT 13*  ALKPHOS 32*  BILITOT 0.5   PT/INR  Recent Labs  10/21/2015 1911  LABPROT 16.0*  INR 1.27   Hepatitis Panel No results for input(s): HEPBSAG, HCVAB, HEPAIGM, HEPBIGM in the last 72 hours.  Studies/Results: Dg Chest Port 1 View  Result Date: 10/03/2015 CLINICAL DATA:  Respiratory failure. EXAM: PORTABLE CHEST 1 VIEW COMPARISON:  10/02/2015 FINDINGS: Tracheal tube tip is 4.1 cm above the base of the carina. Right IJ central line tip overlies the mid to distal SVC. The cardiopericardial silhouette is within normal limits for size. Right parahilar opacity persists. Telemetry leads overlie the chest. IMPRESSION: No substantial change. Electronically Signed   By: Misty Stanley M.D.   On: 10/03/2015 08:11   Scheduled Meds: . sodium chloride   Intravenous Once  . budesonide  (PULMICORT) nebulizer solution  0.5 mg Nebulization BID  . ceFEPime (MAXIPIME) IV  1 g Intravenous Q12H  . chlorhexidine  15 mL Mouth Rinse BID  . hydrocortisone sod succinate (SOLU-CORTEF) inj  100 mg Intravenous Q6H  . insulin aspart  0-9 Units Subcutaneous Q4H  . ipratropium-albuterol  3 mL Nebulization QID  . mouth rinse  15 mL Mouth Rinse QID  . pantoprazole  40 mg Intravenous Q12H  . sodium chloride  1,000 mL Intravenous Once   And  . sodium chloride  500 mL Intravenous Once   And  . sodium chloride  250 mL Intravenous Once   Continuous Infusions: . dextrose    . norepinephrine (LEVOPHED) Adult infusion Stopped (10/03/15 0355)   PRN Meds:.fentaNYL (SUBLIMAZE) injection, midazolam, midazolam, morphine CONCENTRATE, sodium chloride flush, white petrolatum    ASSESMENT:    *  Acute on chronic anemia due to blood loss  *  GIB.  On the 2 EGDs this year findings of non-bleeding bulbar ulcer, esophagitis, gastritis.   On empiric PPI drip.    Last colonoscopy ~ 2010 in [place].   *  Thrombocytopenia, acute on chronic.  Likely due to consumption per 9/9 hematology note.   *  Shock, ? Hypovolemic due to GI bleeding and/or sepsis from PNA (aspiration suspected)  *  AKI  *  PEG placed due to dysphagia aspiration 06/2015.   *   Hx bil lung cancer.  Reading oncology note of 9/9, no supicion for tumor recurrence.   *  Dermatomyositis.  Chronic steroids. Solu-Cortef in place currently. Confers very poor prognosis per Dr Alvy Bimler.   *  Pagets disease.    PLAN   *  Switching to BID IV Protonix.   *  ?  Repeat EGD vs enteroscopy vs colonoscopy.  Dr Havery Moros will be rounding on pt today.  If colonoscopy pursued, will prep via PEG.  Spoke with pt, husband and family who are ok with any GI interventions necessary.    *  Dr Alvy Bimler rec is for transition to comfort care.  Currently is DNR but not comfort care.   *  2 more PRBCs ordered just as I type this.      Azucena Freed   10/20/2015, 1:38 PM Pager: [phone]

## 2015-10-04 NOTE — Interval H&P Note (Signed)
History and Physical Interval Note:  10/22/2015 2:36 PM  Megan Sims  has presented today for surgery, with the diagnosis of ABL anemia, hematochezia.  duodenal ulcer on EGD 9/6.  The various methods of treatment have been discussed with the patient and family. After consideration of risks, benefits and other options for treatment, the patient has consented to  Procedure(s): ESOPHAGOGASTRODUODENOSCOPY (EGD) (N/A) as a surgical intervention .  The patient's history has been reviewed, patient examined, no change in status, stable for surgery.  I have reviewed the patient's chart and labs.  Questions were answered to the patient's satisfaction.     Megan Sims

## 2015-10-04 NOTE — Progress Notes (Signed)
   [facility] PCCM  Pt continues to have continuous red bloody stools. Hb 5.6. Will emergently transfuse 2 units now. Transfuse 4 more. GI aware of pts condition. Family aware of update.   Monica Becton, MD 10/20/2015, 1:44 PM [facility] Pager [phone] After 3 pm or if no answer, call [phone]

## 2015-10-05 ENCOUNTER — Telehealth: Payer: Self-pay | Admitting: *Deleted

## 2015-10-05 ENCOUNTER — Encounter (HOSPITAL_COMMUNITY): Payer: Self-pay | Admitting: Gastroenterology

## 2015-10-05 ENCOUNTER — Inpatient Hospital Stay (HOSPITAL_COMMUNITY): Payer: Medicaid Other

## 2015-10-05 DIAGNOSIS — N179 Acute kidney failure, unspecified: Secondary | ICD-10-CM

## 2015-10-05 LAB — CBC
HCT: 16.5 % — ABNORMAL LOW (ref 36.0–46.0)
HEMATOCRIT: 23.2 % — AB (ref 36.0–46.0)
HEMOGLOBIN: 5.5 g/dL — AB (ref 12.0–15.0)
HEMOGLOBIN: 8 g/dL — AB (ref 12.0–15.0)
MCH: 28.6 pg (ref 26.0–34.0)
MCH: 29.9 pg (ref 26.0–34.0)
MCHC: 33.3 g/dL (ref 30.0–36.0)
MCHC: 34.5 g/dL (ref 30.0–36.0)
MCV: 85.9 fL (ref 78.0–100.0)
MCV: 86.6 fL (ref 78.0–100.0)
PLATELETS: 35 10*3/uL — AB (ref 150–400)
Platelets: 93 10*3/uL — ABNORMAL LOW (ref 150–400)
RBC: 1.92 MIL/uL — AB (ref 3.87–5.11)
RBC: 2.68 MIL/uL — ABNORMAL LOW (ref 3.87–5.11)
RDW: 15.7 % — ABNORMAL HIGH (ref 11.5–15.5)
RDW: 14.6 % (ref 11.5–15.5)
WBC: 15.5 10*3/uL — AB (ref 4.0–10.5)
WBC: 15.5 10*3/uL — AB (ref 4.0–10.5)

## 2015-10-05 LAB — MAGNESIUM: MAGNESIUM: 2 mg/dL (ref 1.7–2.4)

## 2015-10-05 LAB — PREPARE RBC (CROSSMATCH)

## 2015-10-05 LAB — BASIC METABOLIC PANEL
Anion gap: 8 (ref 5–15)
BUN: 75 mg/dL — AB (ref 6–20)
CHLORIDE: 119 mmol/L — AB (ref 101–111)
CO2: 19 mmol/L — ABNORMAL LOW (ref 22–32)
CREATININE: 1.8 mg/dL — AB (ref 0.44–1.00)
Calcium: 6.9 mg/dL — ABNORMAL LOW (ref 8.9–10.3)
GFR, EST AFRICAN AMERICAN: 34 mL/min — AB (ref >60)
GFR, EST NON AFRICAN AMERICAN: 29 mL/min — AB (ref >60)
Glucose, Bld: 136 mg/dL — ABNORMAL HIGH (ref 65–99)
Potassium: 4.3 mmol/L (ref 3.5–5.1)
SODIUM: 146 mmol/L — AB (ref 135–145)

## 2015-10-05 LAB — GLUCOSE, CAPILLARY
GLUCOSE-CAPILLARY: 110 mg/dL — AB (ref 65–99)
GLUCOSE-CAPILLARY: 130 mg/dL — AB (ref 65–99)
GLUCOSE-CAPILLARY: 129 mg/dL — AB (ref 65–99)
GLUCOSE-CAPILLARY: 82 mg/dL (ref 65–99)
GLUCOSE-CAPILLARY: 100 mg/dL — AB (ref 65–99)
Glucose-Capillary: 114 mg/dL — ABNORMAL HIGH (ref 65–99)
Glucose-Capillary: 104 mg/dL — ABNORMAL HIGH (ref 65–99)

## 2015-10-05 LAB — PREPARE PLATELET PHERESIS: Unit division: 0

## 2015-10-05 LAB — HEMOGLOBIN AND HEMATOCRIT, BLOOD
HCT: 24.1 % — ABNORMAL LOW (ref 36.0–46.0)
HCT: 21.9 % — ABNORMAL LOW (ref 36.0–46.0)
HEMATOCRIT: 26.1 % — AB (ref 36.0–46.0)
HEMOGLOBIN: 8.7 g/dL — AB (ref 12.0–15.0)
Hemoglobin: 8.2 g/dL — ABNORMAL LOW (ref 12.0–15.0)
Hemoglobin: 7.6 g/dL — ABNORMAL LOW (ref 12.0–15.0)

## 2015-10-05 LAB — TYPE AND SCREEN
ABO/RH(D): O POS
Antibody Screen: NEGATIVE

## 2015-10-05 LAB — PHOSPHORUS: PHOSPHORUS: 4.3 mg/dL (ref 2.5–4.6)

## 2015-10-05 MED ORDER — SODIUM CHLORIDE 0.9 % IV SOLN: Freq: Once | INTRAVENOUS | Status: DC

## 2015-10-05 MED ORDER — FUROSEMIDE 10 MG/ML IJ SOLN
40.0000 mg | Freq: Two times a day (BID) | INTRAMUSCULAR | Status: DC
  Administered 2015-10-05 – 2015-10-06 (×2): 40 mg via INTRAVENOUS
  Filled 2015-10-05 (×3): qty 4

## 2015-10-05 MED ORDER — HYDROCORTISONE NA SUCCINATE PF 100 MG IJ SOLR
50.0000 mg | Freq: Four times a day (QID) | INTRAMUSCULAR | Status: DC
  Administered 2015-10-05 – 2015-10-06 (×4): 50 mg via INTRAVENOUS
  Filled 2015-10-05 (×4): qty 1

## 2015-10-05 MED ORDER — FUROSEMIDE 10 MG/ML IJ SOLN
40.0000 mg | Freq: Once | INTRAMUSCULAR | Status: AC
  Administered 2015-10-05: 40 mg via INTRAVENOUS
  Filled 2015-10-05: qty 4

## 2015-10-05 NOTE — Progress Notes (Signed)
CRITICAL VALUE ALERT  Critical value received: hgb 5.4  Date of notification:  10/22/2015  Time of notification:  13:40  Critical value read back:Yes.    Nurse who received alert:  Oneita Jolly RN  MD notified (1st page):  Dr. Murlean Iba  Time of first page:  13:40  MD notified (2nd page):  Time of second page:  Responding MD:  Dr. Murlean Iba  Time MD responded:  13:45

## 2015-10-05 NOTE — Telephone Encounter (Signed)
Pt's son called.  He wants Dr. Alvy Bimler to know that pt is doing better. She is waking up, he says she knows who her family members are.  He says that they have stopped her bleeding.  She may be extubated tomorrow if the bleeding does not start again.  He just wanted Dr. Alvy Bimler to be aware of her improvements.   He also wanted to make sure her appts this week are canceled.  Informed him we canceled her appts and will wait to reschedule to see how she does.  He verbalized understanding.

## 2015-10-05 NOTE — Progress Notes (Signed)
PULMONARY / CRITICAL CARE MEDICINE   Name: Megan Sims MRN: [id number] DOB: 1955-04-09    ADMISSION DATE:  09/28/2015 CONSULTATION DATE:  10/02/2015  REFERRING MD:  [facility] Dr. Erlinda Hong  CHIEF COMPLAINT:  Unresponsiveness. Hypovolemic shock  HISTORY OF PRESENT ILLNESS:    Pt is a 60 y.o. female with medical history significant for Lung cancer, not currently on treatment, history of dermatomyositis, CHF,and a recent admission for GI bleed due to duodenal ulcer on PPI (H pylori by US biopsy pending) from 9/5 through 09/30/2015,at which time, she was diagnosed with HCAP, sent home with Levaquin, failing to take the medicine, now presenting with worsening respiratory symptoms.she reports increasing shortness of breath, increased mucus production in the setting of chronic hemoptysis, as well as generalized weakness. The patient denies fevers, chills or diaphoresis. She denies any headaches. She reports chest pain with cough, but no cardiac complaints. She denies any nausea vomiting or diarrhea. She denies any dysuria or gross hematuria.  At the emergency room, she was hemodynamically stable. Her hemoglobin was 8.4. She was started on broad-spectrum antibiotics. She received 2 L of IV fluids for possible sepsis. She was transferred to the floors. She had 2-3 large "dark colored" bowel movements overnight. This morning, the nurse walked in and patient was unresponsive. She was intubated for airway protection. Transferred to the ICU. Her hemoglobin dropped to 3.5 from 8.4.   SUBJECTIVE:  Continues to have BRBPR and transfuse 3 u. Decreasing uo.   VITAL SIGNS: BP (!) 148/69   Pulse (!) 110   Temp 97.5 F (36.4 C) (Axillary)   Resp (!) 29   Ht '5\' 1"'$  (1.549 m)   Wt 69.9 kg (154 lb 1.6 oz)   SpO2 100%   BMI 29.12 kg/m   HEMODYNAMICS: CVP:  [1 mmHg-4 mmHg] 2 mmHg  VENTILATOR SETTINGS: Vent Mode: PRVC FiO2 (%):  [40 %] 40 % Set Rate:  [15 bmp] 15 bmp Vt Set:  [390 mL] 390 mL PEEP:  [5 cmH20] 5  cmH20 Pressure Support:  [5 cmH20] 5 cmH20 Plateau Pressure:  [14 cmH20-18 cmH20] 15 cmH20  INTAKE / OUTPUT: I/O last 3 completed shifts: In: 5123.9 [I.V.:3104.6; Blood:1869.4; IV Piggyback:150] Out: 318 [Urine:318]  PHYSICAL EXAMINATION: General:  Arousable. Answers simple questions.  Neuro:  CN grossly intact. No lateralizing signs noted. Supple neck. HEENT:  PERLA. (-) NVD. Unable to examine airway. ET tube in place. Cardiovascular:  Tachycardic. No S3, rub, gallop, murmur.  Lungs:  Fair air entry. Rhonchi in the right upper and mid lung zones. (-)Wheezing. Abdomen:  Dec BS. Soft abdomen. (-) masses/tenderness.  Musculoskeletal:  Gr 2 edema. Cool distal extremities.  Skin:  (-) clubbing/cyanosi/rash  LABS:  BMET  Recent Labs Lab 10/03/15 2345 10/21/2015 0500 10/05/15 0434  NA 149* 151* 146*  K 4.1 4.6 4.3  CL 123* 124* 119*  CO2 22 20* 19*  BUN 70* 74* 75*  CREATININE 1.37* 1.55* 1.80*  GLUCOSE 152* 119* 136*    Electrolytes  Recent Labs Lab 10/03/15 0254 10/03/15 2345 10/11/2015 0500 10/05/15 0434  CALCIUM 6.9* 6.4* 6.6* 6.9*  MG 1.7 2.0 2.0 2.0  PHOS 2.7  --  3.6 4.3    CBC  Recent Labs Lab 10/03/2015 0745  10/07/2015 1935 09/26/2015 2322 10/05/15 0435  WBC 16.0*  --  17.7*  --  15.5*  HGB 7.0*  < > 9.1* 8.4* 8.0*  HCT 20.9*  < > 26.3* 24.3* 23.2*  PLT 35*  --  115*  --  93*  < > = values in this interval not displayed.  Coag's  Recent Labs Lab 09/28/15 1831 10/18/2015 1911  APTT 27  --   INR 1.07 1.27    Sepsis Markers  Recent Labs Lab 10/12/2015 1911  10/02/15 0540 10/02/15 0800 10/02/15 1735  LATICACIDVEN 4.8*  < > 10.3* 13.6* 2.6*  PROCALCITON 2.37  --   --   --   --   < > = values in this interval not displayed.  ABG  Recent Labs Lab 10/02/15 0954 10/02/15 1520 10/02/15 1700  PHART 7.180* 7.506* 7.471*  PCO2ART 37.2 29.8* 32.1  PO2ART 34.0* 317* 96.7    Liver Enzymes  Recent Labs Lab 09/29/15 0415 10/22/2015 1235  10/02/15 0545  AST 42* 47* 48*  ALT 14 14 13*  ALKPHOS 42 48 32*  BILITOT 0.5 0.8 0.5  ALBUMIN 2.4* 2.0* 1.4*    Cardiac Enzymes  Recent Labs Lab 10/07/2015 1911 10/02/15 1736 10/03/15 0254  TROPONINI 0.10* 0.36* 0.22*    Glucose  Recent Labs Lab 10/03/2015 1124 10/09/2015 1519 10/05/2015 2018 10/07/2015 2324 10/05/15 0441 10/05/15 0744  GLUCAP 103* 164* 114* 110* 130* 129*    Imaging No results found.   STUDIES:  Echo 9/9 >   CULTURES: Blood 9/9 > (-) Trache 9/9 > (-) MRSA 9/9 > (-)  ANTIBIOTICS: Vanc 9/8 > 9/11 Cefepime 9/8 >   SIGNIFICANT EVENTS: 9/8 admitted for possible HCAP.  9/9 intubated for respiratory failure. Transferred to ICU.  LINES/TUBES: L port R IJ 9/9 >   DISCUSSION: 71 female, history of lung cancer, recent admit for healthcare associated pneumonia and GI bleed secondary to duodenal ulcer, admitted for altered mental status, sepsis, possible healthcare associated pneumonia again. Intubated for respiratory failure on 9/9 and profoundly anemic likely from GI source.    ASSESSMENT / PLAN:  PULMONARY A: Acute hypoxemic respiratory failure secondary to unable to protect airway, healthcare associated pneumonia right upper lung and right middle lung, concern for aspiration pneumonia.  Pulm edema H/O Lung CA P:   Keep on a ventilator. Daily PST but no extubation 2/2 possible rescope 2/2 GI bleed still.  Neb Treatments. Keep cefepime.  Trial lasix  CARDIOVASCULAR A:  Hypovolemic shock 2/2 GI bleed Sinus tachycardia P:  tele  RENAL A:   AKI 2/2 Hypovolemia Metabolic acidosis. Lactic acidosis. Hypernatremia and hyperchloremia P:   Pt with dec uo last 24 hrs. K OK. Creat elevated. Edematous. Trial lasix. If not better, may need CVVH. D/c free water.    GASTROINTESTINAL A:   Active GI bleed. Pt with DU but was noted NOT to be actively bleeding on 9/11. 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  S/P EGD on 9/6, 9/11 P:   Protonix BID Hb was 8 this am.  Received 2 u pRBC and 1 pack platelet last 24 hrs. Maroon colored stools this am.  Checking hb and Hct q6 >> if it continues to drop, will need to call GI.  May need a re-scope vs IR coiling.  Has received 8upRBC since 9/9  HEMATOLOGIC A:   Severe anemia. Hemorrhagic and hypovolemic shock secondary to GI bleed. P:  Transfuse packed red blood cells as ordered.   INFECTIOUS A:   HCAP, R mid-lower. Possible aspiration pneumonia. P:   Cont cefepime.    ENDOCRINE A:   No issues.  P:   cbg q 4 SSI Will wean off HC > not sure if on pred  10 mg daily.   NEUROLOGIC A:   AMS / Encephalopathy P:   RASS goal: 0 - -1 PRN fentanyl.    FAMILY  - Husband updated at bedside.  - Updates: - Inter-disciplinary family meet or Palliative Care meeting due by:  Sept 16  Critical care time with this patient today: 30 minutes   J. Shirl Harris, MD [facility] [place] Pager: [phone] After 3 pm or if no response, call [phone]  10/05/2015, 9:42 AM  Addendum :  I had an extensive family meeting with husband and all children.  They had a lot of questions regarding the duodenal ulcer and whether anything else can be done.  Per Dr. Collene Mares, nothing else can be done further.  I mentioned the next step likely is surgery but Dr. Collene Mares is not even recommending that.  Given her over all critical condition right now and prognosis, she will be high risk for complications with surgery if at all she goes to surgery and survives it.  Family is adamant that we transfuse her blood. Will cont present management and keep her DNR.  They wanted another GI opinion and I spoke with the [facility] PA.  Awaiting input from [place], MD 10/05/2015, 9:42 AM [place] [facility] Pager [phone] After 3 pm or if no answer, call [phone]

## 2015-10-05 NOTE — Progress Notes (Signed)
Nutrition Follow-up  DOCUMENTATION CODES:   Not applicable  INTERVENTION:  When medically appropriate, initiate Vital AF at goal of 45 ml/hr via PEG tube.  Provides 1080 ml, 1296 kcal, 81 grams protein, 875 ml H2O. Free water flushes per team in setting of volume overload.  Also provide liquid MVI per tube daily as goal TF rate does not meet 100% RDIs.   Can transition patient to home TF formula and bolus regimen when she is medically stable prior to discharge.   NUTRITION DIAGNOSIS:   Inadequate oral intake related to inability to eat as evidenced by NPO status.  Ongoing.  GOAL:   Patient will meet greater than or equal to 90% of their needs  Not meeting.  MONITOR:   Vent status, Labs, Weight trends, I & O's, TF tolerance  REASON FOR ASSESSMENT:    (Home tube feeder+ventilator, intubated in midst of assessment)    ASSESSMENT:   60 y.o.femalewith medical history significant for Lung cancer, not currently on treatment, history of dermatomyositis, CHF,and a recent admission for GI bleed due to duodenal ulcer on PPI (H pylori by US biopsy pending) from 9/5 through 09/30/2015,at which time, she was diagnosed with HCAP, sent home with Levaquin, failing to take the medicine, now presenting on 9/8 with worsening respiratory symptoms. Intubated 9/9 for respiratory failure. Patient continues to have BRBPR.   Patient is having decreased urine output and is edematous. Trial of Lasix, but pt may need CVVH. Patient may need possible re-scope vs IR coiling in setting of BRBPR.   Team requested RD see patient to determine TF regimen when medically able to restart feeds. Patient unable to write to communicate. No family present at bedside to obtain history. Discussed with RN and gathered history from recent admission. Patient now day 5 without TF.  Current weight of 154 lbs likely elevated in setting of decreased urine output and edema. Admission weight of 127 lbs (57.6 kg) is in line  with recent weight hx - no sign of weight loss.  At home pt receives 1.5 cans Jevity 1.2 every 4 hours QID via PEG tube. Total of 6 cans provides 1710 kcal, 79 grams protein, 1146 ml free water. NPO at home except for ice chips with history of dysphagia.   Patient is currently intubated on ventilator support MV: 7.5 L/min Temp (24hrs), Avg:97.6 F (36.4 C), Min:97.3 F (36.3 C), Max:98.2 F (36.8 C)  Medications reviewed and include: Novolog sliding scale Q4hrs, pantoprazole, fentanyl bolus prn, versed bolus prn.  Labs reviewed: CBG 103-164 past 24 hrs, Sodium 146; Potassium, Phosphorus, and Magnesium WNL.   Nutrition-Focused physical exam completed. Findings are no fat depletion, no muscle depletion, and severe edema.   Diet Order:  Diet NPO time specified  Skin:  Wound (see comment)  Last BM:  10/21/2015  Height:   Ht Readings from Last 1 Encounters:  09/24/2015 '5\' 1"'$  (1.549 m)    Weight:   Wt Readings from Last 1 Encounters:  10/05/15 154 lb 1.6 oz (69.9 kg)    Ideal Body Weight:  47.73 kg  BMI:  Body mass index is 29.12 kg/m.  Estimated Nutritional Needs:   Kcal:  1210 (PSE)  Protein:  75-85 grams  Fluid:  Per team   EDUCATION NEEDS:   No education needs identified at this time  Willey Blade, MS, RD, LDN Pager: [phone] After Hours Pager: [phone]

## 2015-10-05 NOTE — Progress Notes (Signed)
Dr Carlean Purl returned page H&H 7.1 /21.9 given and updated that patient has had one more maroon colored stool since this AM. Orders received to transfuse 1 UPRBC

## 2015-10-05 NOTE — Progress Notes (Signed)
CSW consult acknowledged re: Home Care Needs. CSW has staffed with RN Case Manager who assists with home needs. CSW signing off. Please contact if new need(s) arise.          Emiliano Dyer, LCSW [facility] ED/40M Clinical Social Worker [phone]

## 2015-10-05 NOTE — Progress Notes (Signed)
[facility] paged to report H&H

## 2015-10-05 NOTE — Progress Notes (Signed)
Pharmacy Antibiotic Note  Megan Sims is a 60 y.o. female admitted on 10/02/2015 with pneumonia.  Pharmacy has been consulted for cefepime dosing. Currently afebrile. Vancomycin was held yesterday in the setting of AKI. Respiratory gram stain shows normal respiratory flora. Vancomycin no longer necessary.  Plan: After discussion with Dr. Corrie Dandy, antibiotics will be changed to cefepime only. Vancomycin will be discontinued. .  Cefepime 1g every 24 hours F/u c/s, LOT, s/sx of improvement  Height: '5\' 1"'$  (154.9 cm) Weight: 154 lb 1.6 oz (69.9 kg) IBW/kg (Calculated) : 47.8  Temp (24hrs), Avg:97.6 F (36.4 C), Min:97.3 F (36.3 C), Max:98.2 F (36.8 C)   Recent Labs Lab 10/07/2015 1911 10/02/15 0000 10/02/15 0540 10/02/15 0545 10/02/15 0800  10/02/15 1735  10/03/15 0254  10/03/15 2224 10/03/15 2345 10/03/2015 0248 09/24/2015 0500 10/08/2015 0745 10/13/2015 1935 10/05/15 0434 10/05/15 0435  WBC  --   --   --   --  12.2*  < >  --   < > 13.7*  < > 14.4*  --  15.5*  --  16.0* 17.7*  --  15.5*  CREATININE  --   --   --  0.75  --   --   --   --  1.06*  --   --  1.37*  --  1.55*  --   --  1.80*  --   LATICACIDVEN 4.8* 6.9* 10.3*  --  13.6*  --  2.6*  --   --   --   --   --   --   --   --   --   --   --   < > = values in this interval not displayed.  Estimated Creatinine Clearance: 29.7 mL/min (by C-G formula based on SCr of 1.8 mg/dL).    Allergies  Allergen Reactions  . Hydrocodone-Acetaminophen Swelling    Increases secretions and sensation of throat swelling  . Demeclocycline Other (See Comments)    PILLESOPHAGITIS  . Tetracyclines & Related     PILLESOPHAGITIS  . Tramadol     SWELLING    Cefepime 9/8>> Vancomycin 9/8>>9/11  9/8 Urine: suggest recollection. 9/8 Blood x 2: NGTD x 2 days 9/9 Resp: Few gram variable rods, rare gram positive cocci in pairs, consistent with normal respiratory flora.  Thank you for allowing pharmacy to be a part of this patient's care.  Dierdre Harness, [place], PharmD Clinical Pharmacy Resident [phone] (Pager) 10/05/2015 10:35 AM

## 2015-10-05 NOTE — Progress Notes (Signed)
Daily Rounding Note  10/05/2015, 8:28 AM  LOS: 4 days   SUBJECTIVE:   Chief complaint: bllody stool  Large dark, bloody stool this AM, within the last 30 minutes.  Pt is alert and denies n/v, abd pain.  She is hungry.  Wondering when ETT will be removed.   OBJECTIVE:         Vital signs in last 24 hours:    Temp:  [97.3 F (36.3 C)-98.2 F (36.8 C)] 97.5 F (36.4 C) (09/12 0745) Pulse Rate:  [80-122] 104 (09/12 0800) Resp:  [0-36] 22 (09/12 0800) BP: (81-162)/(47-96) 126/92 (09/12 0800) SpO2:  [91 %-100 %] 100 % (09/12 0800) FiO2 (%):  [40 %] 40 % (09/12 0747) Weight:  [69.9 kg (154 lb 1.6 oz)] 69.9 kg (154 lb 1.6 oz) (09/12 0400) Last BM Date: 10/05/2015 Filed Weights   10/03/15 0758 10/02/2015 0500 10/05/15 0400  Weight: 67.5 kg (148 lb 13 oz) 66.6 kg (146 lb 13.2 oz) 69.9 kg (154 lb 1.6 oz)   General: looks acutely and chronically ill.    Heart: RRR,   Chest: no  Labored breathing.  ETT/vent in place Abdomen: soft, active BS, NT, ND.  PEG site benign.  Extremities: non-pitting edema I n UE and LE Neuro/Psych:  Follows commands.  Appropriate.  Attempting to speak but not able to with ETT.  No tremor.  Moves all 4 limbs.    Intake/Output from previous day: 09/11 0701 - 09/12 0700 In: 2825.9 [I.V.:1604.6; Blood:1221.4] Out: 208 [Urine:208]  Intake/Output this shift: Total I/O In: 50 [I.V.:50] Out: -   Lab Results:  Recent Labs  10/21/2015 0745  09/29/2015 1935 10/21/2015 2322 10/05/15 0435  WBC 16.0*  --  17.7*  --  15.5*  HGB 7.0*  < > 9.1* 8.4* 8.0*  HCT 20.9*  < > 26.3* 24.3* 23.2*  PLT 35*  --  115*  --  93*  < > = values in this interval not displayed. BMET  Recent Labs  10/03/15 2345 10/05/2015 0500 10/05/15 0434  NA 149* 151* 146*  K 4.1 4.6 4.3  CL 123* 124* 119*  CO2 22 20* 19*  GLUCOSE 152* 119* 136*  BUN 70* 74* 75*  CREATININE 1.37* 1.55* 1.80*  CALCIUM 6.4* 6.6* 6.9*   LFT No  results for input(s): PROT, ALBUMIN, AST, ALT, ALKPHOS, BILITOT, BILIDIR, IBILI in the last 72 hours. PT/INR No results for input(s): LABPROT, INR in the last 72 hours. Hepatitis Panel No results for input(s): HEPBSAG, HCVAB, HEPAIGM, HEPBIGM in the last 72 hours.  Studies/Results: No results found.   Scheduled Meds: . budesonide (PULMICORT) nebulizer solution  0.5 mg Nebulization BID  . ceFEPime (MAXIPIME) IV  1 g Intravenous Q24H  . chlorhexidine  15 mL Mouth Rinse BID  . hydrocortisone sod succinate (SOLU-CORTEF) inj  100 mg Intravenous Q6H  . insulin aspart  0-9 Units Subcutaneous Q4H  . ipratropium-albuterol  3 mL Nebulization QID  . mouth rinse  15 mL Mouth Rinse QID  . pantoprazole  40 mg Intravenous Q12H  . sodium chloride  1,000 mL Intravenous Once   And  . sodium chloride  500 mL Intravenous Once   And  . sodium chloride  250 mL Intravenous Once   Continuous Infusions: . sodium chloride    . dextrose 50 mL/hr at 10/05/15 0738  . norepinephrine (LEVOPHED) Adult infusion Stopped (10/03/15 0355)   PRN Meds:.fentaNYL (SUBLIMAZE) injection, midazolam, midazolam, morphine CONCENTRATE, sodium chloride  flush, white petrolatum   ASSESMENT:   *  Upper GI bleed. EGD 9/11: stable, non-bleeding duodenal ulcer.  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  On BID Protonix IV.    *  Blood loss anemia.  S/p 8 PRBCs.  Last 2 on 9/11.  Hgb dropping a bit in last 7 hours.    *  Thrombocytopenia.   Improved with transfusion but declined from post transfusion max. .   *  HCAP.  Trigger may have been aspiration in pt s/p PEG for chronic dysphagia.    PLAN   *  Will d/w Dr Havery Moros timing of restart tube feeds and when to switch to per tube Protonix..  At home is NPO except sips water.      Megan Sims  10/05/2015, 8:28 AM Pager: [phone]

## 2015-10-06 ENCOUNTER — Encounter (HOSPITAL_COMMUNITY): Payer: Self-pay

## 2015-10-06 ENCOUNTER — Ambulatory Visit: Payer: Medicaid Other

## 2015-10-06 ENCOUNTER — Ambulatory Visit: Payer: Medicaid Other | Admitting: Hematology and Oncology

## 2015-10-06 ENCOUNTER — Encounter (HOSPITAL_COMMUNITY): Admission: EM | Disposition: E | Payer: Self-pay | Source: Home / Self Care | Attending: Pulmonary Disease

## 2015-10-06 ENCOUNTER — Other Ambulatory Visit: Payer: Medicaid Other

## 2015-10-06 HISTORY — PX: ESOPHAGOGASTRODUODENOSCOPY: SHX5428

## 2015-10-06 LAB — GLUCOSE, CAPILLARY
GLUCOSE-CAPILLARY: 102 mg/dL — AB (ref 65–99)
GLUCOSE-CAPILLARY: 103 mg/dL — AB (ref 65–99)
GLUCOSE-CAPILLARY: 94 mg/dL (ref 65–99)
Glucose-Capillary: 104 mg/dL — ABNORMAL HIGH (ref 65–99)
Glucose-Capillary: 104 mg/dL — ABNORMAL HIGH (ref 65–99)
Glucose-Capillary: 108 mg/dL — ABNORMAL HIGH (ref 65–99)
Glucose-Capillary: 93 mg/dL (ref 65–99)

## 2015-10-06 LAB — BASIC METABOLIC PANEL
Anion gap: 10 (ref 5–15)
BUN: 77 mg/dL — AB (ref 6–20)
CALCIUM: 7.3 mg/dL — AB (ref 8.9–10.3)
CO2: 19 mmol/L — AB (ref 22–32)
CREATININE: 2.06 mg/dL — AB (ref 0.44–1.00)
Chloride: 119 mmol/L — ABNORMAL HIGH (ref 101–111)
GFR calc Af Amer: 29 mL/min — ABNORMAL LOW (ref >60)
GFR, EST NON AFRICAN AMERICAN: 25 mL/min — AB (ref >60)
GLUCOSE: 100 mg/dL — AB (ref 65–99)
Potassium: 3.8 mmol/L (ref 3.5–5.1)
Sodium: 148 mmol/L — ABNORMAL HIGH (ref 135–145)

## 2015-10-06 LAB — TYPE AND SCREEN
ABO/RH(D): O POS
Antibody Screen: NEGATIVE
UNIT DIVISION: 0
UNIT DIVISION: 0
UNIT DIVISION: 0
UNIT DIVISION: 0
UNIT DIVISION: 0
UNIT DIVISION: 0
UNIT DIVISION: 0
Unit division: 0
Unit division: 0
Unit division: 0
Unit division: 0
Unit division: 0
Unit division: 0

## 2015-10-06 LAB — HEMOGLOBIN AND HEMATOCRIT, BLOOD
HCT: 25 % — ABNORMAL LOW (ref 36.0–46.0)
HCT: 26.2 % — ABNORMAL LOW (ref 36.0–46.0)
HEMATOCRIT: 25.7 % — AB (ref 36.0–46.0)
HEMOGLOBIN: 8.3 g/dL — AB (ref 12.0–15.0)
Hemoglobin: 8.3 g/dL — ABNORMAL LOW (ref 12.0–15.0)
Hemoglobin: 8.5 g/dL — ABNORMAL LOW (ref 12.0–15.0)

## 2015-10-06 LAB — CBC
HEMATOCRIT: 26.3 % — AB (ref 36.0–46.0)
Hemoglobin: 8.7 g/dL — ABNORMAL LOW (ref 12.0–15.0)
MCH: 29.5 pg (ref 26.0–34.0)
MCHC: 33.1 g/dL (ref 30.0–36.0)
MCV: 89.2 fL (ref 78.0–100.0)
PLATELETS: 82 10*3/uL — AB (ref 150–400)
RBC: 2.95 MIL/uL — ABNORMAL LOW (ref 3.87–5.11)
RDW: 15.3 % (ref 11.5–15.5)
WBC: 14 10*3/uL — AB (ref 4.0–10.5)

## 2015-10-06 LAB — MAGNESIUM: Magnesium: 2.1 mg/dL (ref 1.7–2.4)

## 2015-10-06 LAB — PHOSPHORUS: Phosphorus: 5 mg/dL — ABNORMAL HIGH (ref 2.5–4.6)

## 2015-10-06 LAB — CULTURE, BLOOD (ROUTINE X 2)
CULTURE: NO GROWTH
Culture: NO GROWTH

## 2015-10-06 SURGERY — EGD (ESOPHAGOGASTRODUODENOSCOPY)
Anesthesia: Moderate Sedation

## 2015-10-06 MED ORDER — MIDAZOLAM HCL 5 MG/ML IJ SOLN
INTRAMUSCULAR | Status: AC
  Filled 2015-10-06: qty 1

## 2015-10-06 MED ORDER — SODIUM CHLORIDE 0.9 % IV SOLN
INTRAVENOUS | Status: DC
Start: 2015-10-06 — End: 2015-10-09
  Administered 2015-10-06: 08:00:00 via INTRAVENOUS

## 2015-10-06 MED ORDER — FENTANYL CITRATE (PF) 100 MCG/2ML IJ SOLN
INTRAMUSCULAR | Status: AC
  Filled 2015-10-06: qty 2

## 2015-10-06 MED ORDER — DIPHENHYDRAMINE HCL 50 MG/ML IJ SOLN
INTRAMUSCULAR | Status: AC
  Filled 2015-10-06: qty 1

## 2015-10-06 MED ORDER — DIPHENHYDRAMINE HCL 50 MG/ML IJ SOLN
INTRAMUSCULAR | Status: DC | PRN
Start: 2015-10-06 — End: 2015-10-06
  Administered 2015-10-06: 25 mg via INTRAVENOUS

## 2015-10-06 MED ORDER — EPINEPHRINE HCL 0.1 MG/ML IJ SOSY
PREFILLED_SYRINGE | INTRAMUSCULAR | Status: AC
  Filled 2015-10-06: qty 10

## 2015-10-06 MED ORDER — MIDAZOLAM HCL 10 MG/2ML IJ SOLN
INTRAMUSCULAR | Status: DC | PRN
  Administered 2015-10-06 (×2): 2 mg via INTRAVENOUS

## 2015-10-06 MED ORDER — FUROSEMIDE 10 MG/ML IJ SOLN
20.0000 mg | Freq: Two times a day (BID) | INTRAMUSCULAR | Status: DC
  Administered 2015-10-06 – 2015-10-07 (×3): 20 mg via INTRAVENOUS
  Filled 2015-10-06 (×4): qty 2

## 2015-10-06 MED ORDER — IPRATROPIUM-ALBUTEROL 0.5-2.5 (3) MG/3ML IN SOLN
3.0000 mL | Freq: Three times a day (TID) | RESPIRATORY_TRACT | Status: DC
  Administered 2015-10-07 – 2015-10-16 (×27): 3 mL via RESPIRATORY_TRACT
  Filled 2015-10-06 (×28): qty 3

## 2015-10-06 MED ORDER — HYDROCORTISONE NA SUCCINATE PF 100 MG IJ SOLR
50.0000 mg | Freq: Two times a day (BID) | INTRAMUSCULAR | Status: DC
  Administered 2015-10-06 – 2015-10-07 (×2): 50 mg via INTRAVENOUS
  Filled 2015-10-06 (×2): qty 1

## 2015-10-06 NOTE — Progress Notes (Signed)
PULMONARY / CRITICAL CARE MEDICINE   Name: Megan Sims MRN: [id number] DOB: 1955-03-16    ADMISSION DATE:  10/13/2015 CONSULTATION DATE:  10/02/2015  REFERRING MD:  [facility] Dr. Erlinda Hong  CHIEF COMPLAINT:  Unresponsiveness. Hypovolemic shock  HISTORY OF PRESENT ILLNESS:    Pt is a 60 y.o. female with medical history significant for Lung cancer, not currently on treatment, history of dermatomyositis, CHF,and a recent admission for GI bleed due to duodenal ulcer on PPI (H pylori by US biopsy pending) from 9/5 through 09/30/2015,at which time, she was diagnosed with HCAP, sent home with Levaquin, failing to take the medicine, now presenting with worsening respiratory symptoms.she reports increasing shortness of breath, increased mucus production in the setting of chronic hemoptysis, as well as generalized weakness. The patient denies fevers, chills or diaphoresis. She denies any headaches. She reports chest pain with cough, but no cardiac complaints. She denies any nausea vomiting or diarrhea. She denies any dysuria or gross hematuria.  At the emergency room, she was hemodynamically stable. Her hemoglobin was 8.4. She was started on broad-spectrum antibiotics. She received 2 L of IV fluids for possible sepsis. She was transferred to the floors. She had 2-3 large "dark colored" bowel movements overnight. This morning, the nurse walked in and patient was unresponsive. She was intubated for airway protection. Transferred to the ICU. Her hemoglobin dropped to 3.5 from 8.4.   SUBJECTIVE:  Bleeding has subsided. Received 1upRBC last 24 hrs.   VITAL SIGNS: BP (!) 90/45   Pulse 99   Temp 98 F (36.7 C) (Oral)   Resp 13   Ht '5\' 1"'$  (1.549 m)   Wt 68.4 kg (150 lb 12.7 oz)   SpO2 99%   BMI 28.49 kg/m   HEMODYNAMICS: CVP:  [0 mmHg-3 mmHg] 2 mmHg  VENTILATOR SETTINGS: Vent Mode: PRVC FiO2 (%):  [40 %] 40 % Set Rate:  [15 bmp] 15 bmp Vt Set:  [390 mL] 390 mL PEEP:  [5 cmH20] 5 cmH20 Pressure Support:  [5  cmH20] 5 cmH20 Plateau Pressure:  [5 [id number] cmH20] 5 cmH20  INTAKE / OUTPUT: I/O last 3 completed shifts: In: 959 [I.V.:580; Blood:329; IV Piggyback:50] Out: 2268 [Urine:2268]  PHYSICAL EXAMINATION: General:  Arousable. Answers simple questions. Comfortable.  Neuro:  CN grossly intact. No lateralizing signs noted. Supple neck. HEENT:  PERLA. (-) NVD. Unable to examine airway. ET tube in place. Cardiovascular:  Good s1/s2.  No S3, rub, gallop, murmur.  Lungs:  Fair air entry. Crackles at bases Abdomen:  Dec BS. Soft abdomen. (-) masses/tenderness.  Musculoskeletal:  Gr 2 edema. Warm extremities Skin:  (-) clubbing/cyanosi/rash  LABS:  BMET  Recent Labs Lab 10/15/2015 0500 10/05/15 0434 10/03/2015 0435  NA 151* 146* 148*  K 4.6 4.3 3.8  CL 124* 119* 119*  CO2 20* 19* 19*  BUN 74* 75* 77*  CREATININE 1.55* 1.80* 2.06*  GLUCOSE 119* 136* 100*    Electrolytes  Recent Labs Lab 10/05/2015 0500 10/05/15 0434 10/23/2015 0435  CALCIUM 6.6* 6.9* 7.3*  MG 2.0 2.0 2.1  PHOS 3.6 4.3 5.0*    CBC  Recent Labs Lab 10/12/2015 1935  10/05/15 0435  10/05/15 2205 10/13/2015 0435 09/29/2015 0954  WBC 17.7*  --  15.5*  --   --  14.0*  --   HGB 9.1*  < > 8.0*  < > 8.7* 8.7* 8.3*  HCT 26.3*  < > 23.2*  < > 26.1* 26.3* 25.0*  PLT 115*  --  93*  --   --  82*  --   < > = values in this interval not displayed.  Coag's  Recent Labs Lab 09/24/2015 1911  INR 1.27    Sepsis Markers  Recent Labs Lab 10/21/2015 1911  10/02/15 0540 10/02/15 0800 10/02/15 1735  LATICACIDVEN 4.8*  < > 10.3* 13.6* 2.6*  PROCALCITON 2.37  --   --   --   --   < > = values in this interval not displayed.  ABG  Recent Labs Lab 10/02/15 0954 10/02/15 1520 10/02/15 1700  PHART 7.180* 7.506* 7.471*  PCO2ART 37.2 29.8* 32.1  PO2ART 34.0* 317* 96.7    Liver Enzymes  Recent Labs Lab 09/29/2015 1235 10/02/15 0545  AST 47* 48*  ALT 14 13*  ALKPHOS 48 32*  BILITOT 0.8 0.5  ALBUMIN 2.0* 1.4*     Cardiac Enzymes  Recent Labs Lab 10/13/2015 1911 10/02/15 1736 10/03/15 0254  TROPONINI 0.10* 0.36* 0.22*    Glucose  Recent Labs Lab 10/05/15 1112 10/05/15 1633 10/05/15 1953 10/22/2015 0001 09/28/2015 0357 10/02/2015 0830  GLUCAP 104* 82 100* 102* 104* 103*    Imaging No results found.   STUDIES:  Echo 9/9 > EF 65-70%, DD  CULTURES: Blood 9/9 > (-) Trache 9/9 > (-) MRSA 9/9 > (-)  ANTIBIOTICS: Vanc 9/8 > 9/11 Cefepime 9/8   SIGNIFICANT EVENTS: 9/8 admitted for possible HCAP.  9/9 intubated for respiratory failure. Transferred to ICU.  LINES/TUBES: L port R IJ 9/9 >   DISCUSSION: 60 female, history of lung cancer, recent admit for healthcare associated pneumonia and GI bleed secondary to duodenal ulcer, admitted for altered mental status, sepsis, possible healthcare associated pneumonia again. Intubated for respiratory failure on 9/9 and profoundly anemic likely from GI source.    ASSESSMENT / PLAN:  PULMONARY A: Acute hypoxemic respiratory failure secondary to unable to protect airway, healthcare associated pneumonia right upper lung and right middle lung, concern for aspiration pneumonia.  Pulm edema H/O Lung CA P:   Keep on a ventilator. Daily PST but no extubation 2/2 EGD on 9/13.  Needs diuresis as well.  Neb Treatments. Keep cefepime. Plan for 7d Cont diuresis > will dec dose  CARDIOVASCULAR A:  Hypovolemic shock 2/2 GI bleed. Resolved.  Sinus tachycardia P:  tele  RENAL A:   AKI 2/2 Hypovolemia Metabolic acidosis. Lactic acidosis. Hypernatremia and hyperchloremia P:   Dec lasix to 20 mg q12. Making urine.   GASTROINTESTINAL A:   Active GI bleed. Pt with DU but was noted NOT to be actively bleeding on 9/11. 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  S/P EGD on 9/6, 9/11 P:   Protonix BID GI to scope today Hb was 8.3  this am.  Received 1  u pRBC last 24 hrs.  Checking hb and Hct q6   Has received 9upRBC since 9/9, 1 pack platelet on 9/11 Start TF later if OK with GI  HEMATOLOGIC A:   Severe anemia. Hemorrhagic and hypovolemic shock secondary to GI bleed. P:  Transfuse packed red blood cells as ordered.   INFECTIOUS A:   HCAP, R mid-lower. Possible aspiration pneumonia. P:   Cont cefepime. Plan for 7d   ENDOCRINE A:   No issues.  P:   cbg q 4 SSI Pt takes pred 10 mg daily (?rheum d/o) > dec HC to 50 mg q12.   NEUROLOGIC A:   AMS / Encephalopathy P:   RASS goal: 0 - -1 PRN fentanyl.  FAMILY  - Husband and family updated at bedside.  - Updates: - Inter-disciplinary family meet or Palliative Care meeting due by:  Sept 16  Critical care time with this patient today: 32 minutes   J. Shirl Harris, MD Pulmonary and [place] Pager: [phone] After 3 pm or if no response, call [phone]  10/08/2015, 10:38 AM

## 2015-10-06 NOTE — Op Note (Signed)
[facility] Patient Name: Megan Sims Procedure Date : 10/19/2015 MRN: [id number] Attending MD: Carlota Raspberry. Yosiel Thieme , MD Date of Birth: Dec 13, 1955 CSN: [id number] Age: 60 Admit Type: Inpatient Procedure:                Upper GI endoscopy Indications:              Suspected upper gastrointestinal bleeding, history                            of duodenal ulcer with adherent clot and history of                            duodenal bulb dieulafoy lesion s/p therapy, with                            symptoms of recurrent bleeding Providers:                Carlota Raspberry. Havery Moros, MD, Dortha Schwalbe RN, RN,                            Corliss Parish, Technician Referring MD:              Medicines:                Midazolam 4 mg IV, Diphenhydramine 25 mg IV Complications:            No immediate complications. Estimated blood loss:                            None. Estimated Blood Loss:     Estimated blood loss: none. Procedure:                Pre-Anesthesia Assessment:                           - Prior to the procedure, a History and Physical                            was performed, and patient medications and                            allergies were reviewed. The patient's tolerance of                            previous anesthesia was also reviewed. The risks                            and benefits of the procedure and the sedation                            options and risks were discussed with the patient.                            All questions were answered, and informed consent  was obtained. Prior Anticoagulants: The patient has                            taken no previous anticoagulant or antiplatelet                            agents. ASA Grade Assessment: III - A patient with                            severe systemic disease. After reviewing the risks                            and benefits, the patient was deemed in    satisfactory condition to undergo the procedure.                           After obtaining informed consent, the endoscope was                            passed under direct vision. Throughout the                            procedure, the patient's blood pressure, pulse, and                            oxygen saturations were monitored continuously. The                            EG-[id number] ([id number]) scope was introduced through the                            mouth, and advanced to the second part of duodenum.                            The upper GI endoscopy was accomplished without                            difficulty. The patient tolerated the procedure                            well. Scope In: Scope Out: Findings:      Esophagogastric landmarks were identified: the Z-line was found at 39       cm, the gastroesophageal junction was found at 39 cm and the upper       extent of the gastric folds was found at 39 cm from the incisors.      The exam of the esophagus was otherwise normal.      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 This       was characterized by healthy appearing mucosa.      Patchy moderate inflammation characterized by erosions and erythema was       found in the gastric antrum.      The exam of the stomach was otherwise normal. No blood or clots were       noted  in the stomach      One non-bleeding cratered duodenal ulcer with no stigmata of bleeding       was found in the proximal duodenal bulb. The lesion was 12 mm in largest       dimension. The previously noted adherent clot was no longer present.      One small ulcer with a small amount of pigmented material was found in       the duodenal bulb adjacent to previously placed hemostasis clips.       Unclear if this finding is related to ischemic changes following       endoscopic therapy from the last exam. The lesion was 4 mm in largest       dimension or some with some pigmented material,  but no high risk       stigmata of bleeding. One hemostatic clip was successfully placed at the       site in case this had been related at all to the patient's symptoms.       There was no bleeding during, or at the end, of the procedure.      The exam of the duodenum was otherwise normal. Bile was noted in the       duodenum without any evidence of bleeding. The previously noted area of       treatment for the dieulafoy lesion was seen in the distal duodenal bulb       and no evidence of blood or bleeding was noted. Impression:               - Esophagogastric landmarks identified.                           - Gastrostomy present characterized by healthy                            appearing mucosa.                           - Gastritis.                           - One non-bleeding clean based duodenal ulcer with                            no stigmata of bleeding.                           - Previously placed hemostasis clips at site of                            prior dieulafoy lesion noted, without any evidence                            of bleeding.                           - One small area of ulceration with pigmented                            material at site of previous hemostasis clip  placement. Unclear if this is related to ischemic                            changes from previous endoscopic therapy. Clip was                            placed in case this was at all related to recent                            bleeding symptoms.                           Overall, while pathology is noted as above, there                            is no evidence of active bleeding at this time or                            any high risk stigmata for bleeding. Moderate Sedation:      Moderate (conscious) sedation was administered by the endoscopy nurse       and supervised by the endoscopist. The following parameters were       monitored: oxygen saturation, heart rate,  blood pressure, and response       to care. Total physician intraservice time was 11 minutes. Recommendation:           - Remain ICU for ongoing care.                           - continue IV PPI                           - NPO for today, monitor Hgb. If no further                            evidence of bleeding tomorrow can resume tube feeds                           - Trend Hgb                           - if patient has recurrent symptoms of bleeding,                            please contact us. We may consider a tagged RBC                            scan in this setting to ensure no other source,                            given no findings with high risk stigmata of                            bleeding were noted on this exam                           -  H pylori IgG serology if not yet done, treat if                            positive                           - GI service will continue to follow Procedure Code(s):        --- Professional ---                           [phone], Esophagogastroduodenoscopy, flexible,                            transoral; with control of bleeding, any method                           99152, Moderate sedation services provided by the                            same physician or other qualified health care                            professional performing the diagnostic or                            therapeutic service that the sedation supports,                            requiring the presence of an independent trained                            observer to assist in the monitoring of the                            patient's level of consciousness and physiological                            status; initial 15 minutes of intraservice time,                            patient age 76 years or older Diagnosis Code(s):        --- Professional ---                           Z93.1, Gastrostomy status                           K29.70, Gastritis, unspecified,  without bleeding                           K26.9, Duodenal ulcer, unspecified as acute or                            chronic, without hemorrhage or perforation CPT copyright 2016 [employer]. All rights reserved. The codes documented in this report  are preliminary and upon coder review may  be revised to meet current compliance requirements. Megan Lipps P. Elzora Cullins, MD 10/20/2015 3:12:47 PM This report has been signed electronically. Number of Addenda: 0

## 2015-10-06 NOTE — H&P (View-Only) (Signed)
Progress Note   Subjective  Patient had one maroon colored stool at 3PM yesterday, with drop in Hgb of 1 point. Hemodynamically stable. Per nursing no other stools reported overnight. Hgb stable at 8.7 x 2 draws after transfusion.   Objective   Vital signs in last 24 hours: Temp:  [97.5 F (36.4 C)-98 F (36.7 C)] 98 F (36.7 C) (09/13 0400) Pulse Rate:  [78-110] 91 (09/13 0700) Resp:  [5-29] 12 (09/13 0700) BP: (91-148)/(46-98) 114/53 (09/13 0600) SpO2:  [97 %-100 %] 100 % (09/13 0700) FiO2 (%):  [40 %] 40 % (09/13 0400) Weight:  [150 lb 12.7 oz (68.4 kg)] 150 lb 12.7 oz (68.4 kg) (09/13 0500) Last BM Date: 10/12/2015 General:    AA female in NAD, ventilated Heart:  Regular rate and rhythm;  Lungs: ventilated, respirations even and unlabored Abdomen:  Soft, nontender, PEG in place Extremities:  Edema in B UE and LE. Neurologic:  sleeping. Psych:  sleeping  Intake/Output from previous day: 09/12 0701 - 09/13 0700 In: 479 [I.V.:100; Blood:329; IV Piggyback:50] Out: 2165 [Urine:2165] Intake/Output this shift: No intake/output data recorded.  Lab Results:  Recent Labs  10/14/2015 1935  10/05/15 0435  10/05/15 1703 10/05/15 2205 09/28/2015 0435  WBC 17.7*  --  15.5*  --   --   --  PENDING  HGB 9.1*  < > 8.0*  < > 7.6* 8.7* 8.7*  HCT 26.3*  < > 23.2*  < > 21.9* 26.1* 26.3*  PLT 115*  --  93*  --   --   --  82*  < > = values in this interval not displayed. BMET  Recent Labs  09/30/2015 0500 10/05/15 0434 10/19/2015 0435  NA 151* 146* 148*  K 4.6 4.3 3.8  CL 124* 119* 119*  CO2 20* 19* 19*  GLUCOSE 119* 136* 100*  BUN 74* 75* 77*  CREATININE 1.55* 1.80* 2.06*  CALCIUM 6.6* 6.9* 7.3*   LFT No results for input(s): PROT, ALBUMIN, AST, ALT, ALKPHOS, BILITOT, BILIDIR, IBILI in the last 72 hours. PT/INR No results for input(s): LABPROT, INR in the last 72 hours.  Studies/Results: Dg Chest Port 1 View  Result Date: 10/05/2015 CLINICAL DATA:  Respiratory  failure. EXAM: PORTABLE CHEST 1 VIEW COMPARISON:  10/03/2015. FINDINGS: Rotated film is suboptimal. Unchanged tubes and lines. ET tube 4.3 cm above carina. RIGHT upper lobe infiltrate is stable. Unchanged cardiomediastinal silhouette. Telemetry leads. IMPRESSION: Stable chest.  RIGHT upper lobe infiltrate. Electronically Signed   By: Staci Righter M.D.   On: 10/05/2015 10:33       Assessment / Plan:   60 y/o female with multiple medical problems, including respiratory failure from HAP on ventilator, with upper GI bleeding. There was a cratered duodenal bulb ulcer on EGD on 9/11 however this was not bleeding at the time of the exam. There was a suspected Dieulafoy lesion that was actively bleeding on the opposite side of the duodenal bulb wall which was treated with hemostasis clips and epinephrine. The patient had recurrent bleeding yesterday, with drop in Hgb. She was transfused overnight and had an appropriate response in Hgb. No further bowel movements per nursing since 3 PM yesterday.   I have spoken to the family about her case. She has a poor long term prognosis given multiple medical problems, and while she has been DNR the family has continued to want everything done for the patient including blood products and invasive procedures since I have been involved in  her case. Given the events from yesterday I am concerned the patient had rebleeding from either the lesion that was recently treated, or the ulcer. The patient is stable at this time, and can consider endoscopy today to reassess for high risk stigmata of bleeding if the family continues to want everything done, however, will continue to address goals of care as renal function / urine output worsening and long term prognosis remains poor. I will reassess her later this morning / afternoon for possible endoscopy. Granddaughter has provided consent over the phone.  Ouray Cellar, MD [facility] Pager [phone]

## 2015-10-06 NOTE — Progress Notes (Signed)
Progress Note   Subjective  Patient had one maroon colored stool at 3PM yesterday, with drop in Hgb of 1 point. Hemodynamically stable. Per nursing no other stools reported overnight. Hgb stable at 8.7 x 2 draws after transfusion.   Objective   Vital signs in last 24 hours: Temp:  [97.5 F (36.4 C)-98 F (36.7 C)] 98 F (36.7 C) (09/13 0400) Pulse Rate:  [78-110] 91 (09/13 0700) Resp:  [5-29] 12 (09/13 0700) BP: (91-148)/(46-98) 114/53 (09/13 0600) SpO2:  [97 %-100 %] 100 % (09/13 0700) FiO2 (%):  [40 %] 40 % (09/13 0400) Weight:  [150 lb 12.7 oz (68.4 kg)] 150 lb 12.7 oz (68.4 kg) (09/13 0500) Last BM Date: 10/14/2015 General:    AA female in NAD, ventilated Heart:  Regular rate and rhythm;  Lungs: ventilated, respirations even and unlabored Abdomen:  Soft, nontender, PEG in place Extremities:  Edema in B UE and LE. Neurologic:  sleeping. Psych:  sleeping  Intake/Output from previous day: 09/12 0701 - 09/13 0700 In: 479 [I.V.:100; Blood:329; IV Piggyback:50] Out: 2165 [Urine:2165] Intake/Output this shift: No intake/output data recorded.  Lab Results:  Recent Labs  10/05/2015 1935  10/05/15 0435  10/05/15 1703 10/05/15 2205 10/15/2015 0435  WBC 17.7*  --  15.5*  --   --   --  PENDING  HGB 9.1*  < > 8.0*  < > 7.6* 8.7* 8.7*  HCT 26.3*  < > 23.2*  < > 21.9* 26.1* 26.3*  PLT 115*  --  93*  --   --   --  82*  < > = values in this interval not displayed. BMET  Recent Labs  10/03/2015 0500 10/05/15 0434 10/09/2015 0435  NA 151* 146* 148*  K 4.6 4.3 3.8  CL 124* 119* 119*  CO2 20* 19* 19*  GLUCOSE 119* 136* 100*  BUN 74* 75* 77*  CREATININE 1.55* 1.80* 2.06*  CALCIUM 6.6* 6.9* 7.3*   LFT No results for input(s): PROT, ALBUMIN, AST, ALT, ALKPHOS, BILITOT, BILIDIR, IBILI in the last 72 hours. PT/INR No results for input(s): LABPROT, INR in the last 72 hours.  Studies/Results: Dg Chest Port 1 View  Result Date: 10/05/2015 CLINICAL DATA:  Respiratory  failure. EXAM: PORTABLE CHEST 1 VIEW COMPARISON:  10/03/2015. FINDINGS: Rotated film is suboptimal. Unchanged tubes and lines. ET tube 4.3 cm above carina. RIGHT upper lobe infiltrate is stable. Unchanged cardiomediastinal silhouette. Telemetry leads. IMPRESSION: Stable chest.  RIGHT upper lobe infiltrate. Electronically Signed   By: Staci Righter M.D.   On: 10/05/2015 10:33       Assessment / Plan:   60 y/o female with multiple medical problems, including respiratory failure from HAP on ventilator, with upper GI bleeding. There was a cratered duodenal bulb ulcer on EGD on 9/11 however this was not bleeding at the time of the exam. There was a suspected Dieulafoy lesion that was actively bleeding on the opposite side of the duodenal bulb wall which was treated with hemostasis clips and epinephrine. The patient had recurrent bleeding yesterday, with drop in Hgb. She was transfused overnight and had an appropriate response in Hgb. No further bowel movements per nursing since 3 PM yesterday.   I have spoken to the family about her case. She has a poor long term prognosis given multiple medical problems, and while she has been DNR the family has continued to want everything done for the patient including blood products and invasive procedures since I have been involved in  her case. Given the events from yesterday I am concerned the patient had rebleeding from either the lesion that was recently treated, or the ulcer. The patient is stable at this time, and can consider endoscopy today to reassess for high risk stigmata of bleeding if the family continues to want everything done, however, will continue to address goals of care as renal function / urine output worsening and long term prognosis remains poor. I will reassess her later this morning / afternoon for possible endoscopy. Granddaughter has provided consent over the phone.  [place] Cellar, MD [facility] Pager [phone]

## 2015-10-06 NOTE — Care Management Note (Signed)
Case Management Note  Patient Details  Name: [name] MRN: [id number] Date of Birth: May 11, 1955  Subjective/Objective:       Pt admitted after being found unresponsive with hypovolemic shock             Action/Plan:  Pt is from home with husband - has had 8 admits in the past 6 months.  Per last CM note ; family was to follow up with PCP on placement into a facility.  CM informed CSW of pending referral.  PT/OT will be ordered when pt is medically stable.  Pt is currently intubated.  CM will continue to follow for discharge needs   Expected Discharge Date:                  Expected Discharge Plan:  [facility]  In-House Referral:  Clinical Social Work  Discharge planning Services  CM Consult  Post Acute Care Choice:    Choice offered to:     DME Arranged:    DME Agency:     HH Arranged:    [place] Agency:     Status of Service:  In process, will continue to follow  If discussed at Long Length of Stay Meetings, dates discussed:    Additional Comments:  Maryclare Labrador, RN 10/14/2015, 2:54 PM

## 2015-10-06 NOTE — Interval H&P Note (Signed)
History and Physical Interval Note:  10/14/2015 2:48 PM  Megan Sims  has presented today for surgery, with the diagnosis of gi bleed  The various methods of treatment have been discussed with the patient and family. After consideration of risks, benefits and other options for treatment, the patient has consented to  Procedure(s): ESOPHAGOGASTRODUODENOSCOPY (EGD) (N/A) as a surgical intervention .  The patient's history has been reviewed, patient examined, no change in status, stable for surgery.  I have reviewed the patient's chart and labs.  Questions were answered to the patient's satisfaction.     Megan Sims

## 2015-10-07 ENCOUNTER — Encounter (HOSPITAL_COMMUNITY): Payer: Self-pay | Admitting: Gastroenterology

## 2015-10-07 DIAGNOSIS — R579 Shock, unspecified: Secondary | ICD-10-CM

## 2015-10-07 LAB — BASIC METABOLIC PANEL
Anion gap: 12 (ref 5–15)
Anion gap: 11 (ref 5–15)
BUN: 77 mg/dL — ABNORMAL HIGH (ref 6–20)
BUN: 79 mg/dL — AB (ref 6–20)
CALCIUM: 7.5 mg/dL — AB (ref 8.9–10.3)
CHLORIDE: 119 mmol/L — AB (ref 101–111)
CO2: 18 mmol/L — AB (ref 22–32)
CO2: 19 mmol/L — ABNORMAL LOW (ref 22–32)
CREATININE: 2.36 mg/dL — AB (ref 0.44–1.00)
Calcium: 7.8 mg/dL — ABNORMAL LOW (ref 8.9–10.3)
Chloride: 120 mmol/L — ABNORMAL HIGH (ref 101–111)
Creatinine, Ser: 2.34 mg/dL — ABNORMAL HIGH (ref 0.44–1.00)
GFR calc Af Amer: 25 mL/min — ABNORMAL LOW (ref >60)
GFR calc Af Amer: 25 mL/min — ABNORMAL LOW (ref >60)
GFR calc non Af Amer: 21 mL/min — ABNORMAL LOW (ref >60)
GFR, EST NON AFRICAN AMERICAN: 21 mL/min — AB (ref >60)
GLUCOSE: 128 mg/dL — AB (ref 65–99)
Glucose, Bld: 111 mg/dL — ABNORMAL HIGH (ref 65–99)
POTASSIUM: 3.2 mmol/L — AB (ref 3.5–5.1)
POTASSIUM: 3.6 mmol/L (ref 3.5–5.1)
SODIUM: 150 mmol/L — AB (ref 135–145)
Sodium: 149 mmol/L — ABNORMAL HIGH (ref 135–145)

## 2015-10-07 LAB — GLUCOSE, CAPILLARY
GLUCOSE-CAPILLARY: 110 mg/dL — AB (ref 65–99)
GLUCOSE-CAPILLARY: 110 mg/dL — AB (ref 65–99)
GLUCOSE-CAPILLARY: 112 mg/dL — AB (ref 65–99)
GLUCOSE-CAPILLARY: 145 mg/dL — AB (ref 65–99)
GLUCOSE-CAPILLARY: 160 mg/dL — AB (ref 65–99)
Glucose-Capillary: 127 mg/dL — ABNORMAL HIGH (ref 65–99)

## 2015-10-07 LAB — CBC
HEMATOCRIT: 25.6 % — AB (ref 36.0–46.0)
Hemoglobin: 8.5 g/dL — ABNORMAL LOW (ref 12.0–15.0)
MCH: 30.1 pg (ref 26.0–34.0)
MCHC: 33.2 g/dL (ref 30.0–36.0)
MCV: 90.8 fL (ref 78.0–100.0)
PLATELETS: 76 10*3/uL — AB (ref 150–400)
RBC: 2.82 MIL/uL — ABNORMAL LOW (ref 3.87–5.11)
RDW: 16.2 % — AB (ref 11.5–15.5)
WBC: 9.2 10*3/uL (ref 4.0–10.5)

## 2015-10-07 LAB — PHOSPHORUS
PHOSPHORUS: 5.7 mg/dL — AB (ref 2.5–4.6)
Phosphorus: 5.5 mg/dL — ABNORMAL HIGH (ref 2.5–4.6)
Phosphorus: 5.5 mg/dL — ABNORMAL HIGH (ref 2.5–4.6)

## 2015-10-07 LAB — MAGNESIUM
MAGNESIUM: 2.1 mg/dL (ref 1.7–2.4)
Magnesium: 1.9 mg/dL (ref 1.7–2.4)
Magnesium: 2 mg/dL (ref 1.7–2.4)

## 2015-10-07 LAB — HEMOGLOBIN AND HEMATOCRIT, BLOOD
HCT: 27.4 % — ABNORMAL LOW (ref 36.0–46.0)
HEMOGLOBIN: 8.8 g/dL — AB (ref 12.0–15.0)

## 2015-10-07 MED ORDER — POTASSIUM CHLORIDE 20 MEQ/15ML (10%) PO SOLN
40.0000 meq | Freq: Once | ORAL | Status: AC
  Administered 2015-10-07: 40 meq
  Filled 2015-10-07: qty 30

## 2015-10-07 MED ORDER — PRO-STAT SUGAR FREE PO LIQD
30.0000 mL | Freq: Two times a day (BID) | ORAL | Status: DC
  Administered 2015-10-07 – 2015-10-08 (×3): 30 mL
  Filled 2015-10-07 (×3): qty 30

## 2015-10-07 MED ORDER — POTASSIUM CHLORIDE 20 MEQ PO PACK: 40.0000 meq | PACK | Freq: Once | ORAL | Status: DC

## 2015-10-07 MED ORDER — HYDROCORTISONE NA SUCCINATE PF 100 MG IJ SOLR
50.0000 mg | INTRAMUSCULAR | Status: DC
  Administered 2015-10-08 – 2015-10-11 (×4): 50 mg via INTRAVENOUS
  Filled 2015-10-07 (×4): qty 1

## 2015-10-07 MED ORDER — VITAL HIGH PROTEIN PO LIQD
1000.0000 mL | ORAL | Status: DC
  Administered 2015-10-07: 1000 mL
  Filled 2015-10-07: qty 1000

## 2015-10-07 NOTE — Progress Notes (Signed)
PULMONARY / CRITICAL CARE MEDICINE   Name: Megan Sims MRN: [id number] DOB: 08-21-1955    ADMISSION DATE:  09/29/2015 CONSULTATION DATE:  10/02/2015  REFERRING MD:  [facility] Dr. Erlinda Hong  CHIEF COMPLAINT:  Unresponsiveness. Hypovolemic shock  HISTORY OF PRESENT ILLNESS:    Pt is a 60 y.o. female with medical history significant for Lung cancer, not currently on treatment, history of dermatomyositis, CHF,and a recent admission for GI bleed due to duodenal ulcer on PPI (H pylori by US biopsy pending) from 9/5 through 09/30/2015,at which time, she was diagnosed with HCAP, sent home with Levaquin, failing to take the medicine, now presenting with worsening respiratory symptoms.she reports increasing shortness of breath, increased mucus production in the setting of chronic hemoptysis, as well as generalized weakness. The patient denies fevers, chills or diaphoresis. She denies any headaches. She reports chest pain with cough, but no cardiac complaints. She denies any nausea vomiting or diarrhea. She denies any dysuria or gross hematuria.  At the emergency room, she was hemodynamically stable. Her hemoglobin was 8.4. She was started on broad-spectrum antibiotics. She received 2 L of IV fluids for possible sepsis. She was transferred to the floors. She had 2-3 large "dark colored" bowel movements overnight. This morning, the nurse walked in and patient was unresponsive. She was intubated for airway protection. Transferred to the ICU. Her hemoglobin dropped to 3.5 from 8.4.   SUBJECTIVE:  Doing PST. Tachpneic.  Still volume overloaded. (-) BM in 24 hrs.  (-) transfusion since 9/12.   VITAL SIGNS: BP (!) 114/50   Pulse 99   Temp 97.9 F (36.6 C) (Oral)   Resp 14   Ht '5\' 1"'$  (1.549 m)   Wt 65.5 kg (144 lb 6.4 oz)   SpO2 100%   BMI 27.28 kg/m   HEMODYNAMICS: CVP:  [0 mmHg-2 mmHg] 1 mmHg  VENTILATOR SETTINGS: Vent Mode: PSV;CPAP FiO2 (%):  [40 %] 40 % Set Rate:  [15 bmp] 15 bmp Vt Set:  [390 mL] 390  mL PEEP:  [5 cmH20] 5 cmH20 Pressure Support:  [5 cmH20] 5 cmH20 Plateau Pressure:  [12 cmH20-16 cmH20] 12 cmH20  INTAKE / OUTPUT: I/O last 3 completed shifts: In: 579 [I.V.:220; Blood:279; Other:30; IV Piggyback:50] Out: 2683 [MHDQQ:2297]  PHYSICAL EXAMINATION: General:  . Comfortable. tachypneic on PST Neuro:  CN grossly intact. No lateralizing signs noted. Supple neck. HEENT:  PERLA. (-) NVD. Unable to examine airway. ET tube in place. Cardiovascular:  Good s1/s2.  No S3, rub, gallop, murmur.  Lungs:  Fair air entry. Crackles at bases Abdomen:  Dec BS. Soft abdomen. (-) masses/tenderness.  Musculoskeletal:  Gr 2 edema. Swelling in her BUE. Warm extremities Skin:  (-) clubbing/cyanosi/rash  LABS:  BMET  Recent Labs Lab 10/05/15 0434 10/22/2015 0435 10/07/15 0354  NA 146* 148* 149*  K 4.3 3.8 3.2*  CL 119* 119* 119*  CO2 19* 19* 18*  BUN 75* 77* 77*  CREATININE 1.80* 2.06* 2.34*  GLUCOSE 136* 100* 111*    Electrolytes  Recent Labs Lab 10/05/15 0434 09/26/2015 0435 10/07/15 0354  CALCIUM 6.9* 7.3* 7.5*  MG 2.0 2.1 1.9  PHOS 4.3 5.0* 5.7*    CBC  Recent Labs Lab 10/05/15 0435  10/20/2015 0435  10/20/2015 1530 10/15/2015 2154 10/07/15 0354  WBC 15.5*  --  14.0*  --   --   --  9.2  HGB 8.0*  < > 8.7*  < > 8.3* 8.5* 8.5*  HCT 23.2*  < > 26.3*  < >  25.7* 26.2* 25.6*  PLT 93*  --  82*  --   --   --  76*  < > = values in this interval not displayed.  Coag's  Recent Labs Lab 10/02/2015 1911  INR 1.27    Sepsis Markers  Recent Labs Lab 10/22/2015 1911  10/02/15 0540 10/02/15 0800 10/02/15 1735  LATICACIDVEN 4.8*  < > 10.3* 13.6* 2.6*  PROCALCITON 2.37  --   --   --   --   < > = values in this interval not displayed.  ABG  Recent Labs Lab 10/02/15 0954 10/02/15 1520 10/02/15 1700  PHART 7.180* 7.506* 7.471*  PCO2ART 37.2 29.8* 32.1  PO2ART 34.0* 317* 96.7    Liver Enzymes  Recent Labs Lab 10/08/2015 1235 10/02/15 0545  AST 47* 48*  ALT 14  13*  ALKPHOS 48 32*  BILITOT 0.8 0.5  ALBUMIN 2.0* 1.4*    Cardiac Enzymes  Recent Labs Lab 10/23/2015 1911 10/02/15 1736 10/03/15 0254  TROPONINI 0.10* 0.36* 0.22*    Glucose  Recent Labs Lab 10/15/2015 1147 09/26/2015 1528 10/03/2015 1945 09/27/2015 2337 10/07/15 0340 10/07/15 0818  GLUCAP 104* 94 93 108* 112* 110*    Imaging No results found.   STUDIES:  Echo 9/9 > EF 65-70%, DD  CULTURES: Blood 9/9 > (-) Trache 9/9 > (-) MRSA 9/9 > (-)  ANTIBIOTICS: Vanc 9/8 > 9/11 Cefepime 9/8 - 9/14  SIGNIFICANT EVENTS: 9/8 admitted for possible HCAP.  9/9 intubated for respiratory failure. Transferred to ICU.  LINES/TUBES: L port R IJ 9/9 >   DISCUSSION: 26 female, history of lung cancer, recent admit for healthcare associated pneumonia and GI bleed secondary to duodenal ulcer, admitted for altered mental status, sepsis, possible healthcare associated pneumonia again. Intubated for respiratory failure on 9/9 and profoundly anemic likely from GI source.    ASSESSMENT / PLAN:  PULMONARY A: Acute hypoxemic respiratory failure secondary to unable to protect airway, healthcare associated pneumonia right upper lung and right middle lung, concern for aspiration pneumonia.  Pulm edema H/O Lung CA P:   Keep on a ventilator. Daily PST. Still volume overloaded. Neb Treatments. Keep cefepime. Plan for 7d > d/c 9/14 Cont diuresis   CARDIOVASCULAR A:  Hypovolemic shock 2/2 GI bleed. Resolved.  Sinus tachycardia P:  tele  RENAL A:   AKI 2/2 Hypovolemia Metabolic acidosis. Lactic acidosis. Hypernatremia and hyperchloremia > better P:   cont lasix to 20 mg q12. Making urine. Check BMP in pm > if with less HCO3, may need to d/c lasix.   GASTROINTESTINAL A:   Active GI bleed. Pt with DU but was noted NOT to be actively bleeding on 9/11. 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  S/P EGD on 9/6, 9/11, 9/13  P:   No  transfusion last 24 hrs. Rpt EGD on 9/13 >> no bleeding.  Protonix BID Checking hb and Hct 12.  Has received 9upRBC since 9/9, 1 pack platelet on 9/11. No transfusion since 9/12 Start TF   HEMATOLOGIC A:   Severe anemia. Hemorrhagic and hypovolemic shock secondary to GI bleed. P:  Transfuse packed red blood cells to keep Hb > 7. Check Hb to q12  INFECTIOUS A:   HCAP, R mid-lower. Possible aspiration pneumonia. P:   Cont cefepime. Plan for 7d > end 9/14   ENDOCRINE A:   No issues.  P:   cbg q 4 SSI Pt takes pred 10 mg daily (?rheum d/o) >  dec HC to 50 mg q 24  NEUROLOGIC A:   AMS / Encephalopathy P:   RASS goal: 0 - -1 PRN fentanyl.    FAMILY  - needs family discussion - Updates: - Inter-disciplinary family meet or Palliative Care meeting due by:  Sept 16  Critical care time with this patient today: 35 minutes   J. Shirl Harris, MD Pulmonary and [place] Pager: [phone] After 3 pm or if no response, call [phone]  10/07/2015, 11:04 AM

## 2015-10-07 NOTE — Progress Notes (Signed)
Interdisciplinary Goals of Care Family Meeting    Date carried out:: 10/07/2015  Location of the meeting: Bedside  Member's involved: Physician and Bedside Registered Nurse  Durable Power of Attorney or acting medical decision maker: Husband  Megan Sims  Discussion: We discussed goals of care for Megan Sims .  Patient was involved with the discussion.  Patient said she wanted everything done.  Husband and chldren agree with patient and want pt to be Full Code. Mentioned about possibility of needing trache >> will aseess the need in the next days. No indication for HD yet.   Code status: Full Code  Disposition: Continue current acute care  Time spent for the meeting: 20 minutes.   [place] 10/07/2015 2:22 PM

## 2015-10-07 NOTE — Care Management Note (Signed)
Case Management Note  Patient Details  Name: BRAILEY BUESCHER MRN: [id number] Date of Birth: 04/30/1955  Subjective/Objective:       Pt admitted after being found unresponsive with hypovolemic shock             Action/Plan:  Pt is from home with husband - has had 8 admits in the past 6 months.  Per last CM note ; family was to follow up with PCP on placement into a facility.  CM informed CSW of pending referral.  PT/OT will be ordered when pt is medically stable.  Pt is currently intubated.  CM will continue to follow for discharge needs  CM informed by agency that pt is active with [facility].   Expected Discharge Date:                  Expected Discharge Plan:  Skilled Nursing Facility  In-House Referral:  Clinical Social Work  Discharge planning Services  CM Consult  Post Acute Care Choice:    Choice offered to:     DME Arranged:    DME Agency:     HH Arranged:    [place] Agency:     Status of Service:  In process, will continue to follow  If discussed at Long Length of Stay Meetings, dates discussed:    Additional Comments:  Maryclare Labrador, RN 10/07/2015, 10:09 AM

## 2015-10-07 NOTE — Progress Notes (Signed)
Progress Note   Subjective  Nursing reports no further bowel movements since noon yesterday. EGD done yesterday afternoon with no active bleeding. Hgb stable last few blood draws.   Objective   Vital signs in last 24 hours: Temp:  [97.4 F (36.3 C)-98.2 F (36.8 C)] 98.2 F (36.8 C) (09/14 0338) Pulse Rate:  [85-114] 99 (09/14 0600) Resp:  [5-29] 21 (09/14 0600) BP: (90-144)/(43-78) 103/48 (09/14 0600) SpO2:  [94 %-100 %] 100 % (09/14 0600) FiO2 (%):  [40 %] 40 % (09/14 0400) Weight:  [144 lb 6.4 oz (65.5 kg)] 144 lb 6.4 oz (65.5 kg) (09/14 0500) Last BM Date: 10/05/15 General:    AA female, sleeping, intubated Heart:  Regular rate and rhythm; no murmurs Lungs: ventilated, Respirations even and unlabored,  Abdomen:  Soft, nontender and nondistended. . Extremities:  (+) 2 edema. Neurologic:  Sleeping.   Intake/Output from previous day: 09/13 0701 - 09/14 0700 In: 300 [I.V.:220; IV Piggyback:50] Out: 1610 [Urine:2340] Intake/Output this shift: No intake/output data recorded.  Lab Results:  Recent Labs  10/05/15 0435  09/27/2015 0435  10/02/2015 1530 10/17/2015 2154 10/07/15 0354  WBC 15.5*  --  14.0*  --   --   --  9.2  HGB 8.0*  < > 8.7*  < > 8.3* 8.5* 8.5*  HCT 23.2*  < > 26.3*  < > 25.7* 26.2* 25.6*  PLT 93*  --  82*  --   --   --  76*  < > = values in this interval not displayed. BMET  Recent Labs  10/05/15 0434 10/03/2015 0435 10/07/15 0354  NA 146* 148* 149*  K 4.3 3.8 3.2*  CL 119* 119* 119*  CO2 19* 19* 18*  GLUCOSE 136* 100* 111*  BUN 75* 77* 77*  CREATININE 1.80* 2.06* 2.34*  CALCIUM 6.9* 7.3* 7.5*   LFT No results for input(s): PROT, ALBUMIN, AST, ALT, ALKPHOS, BILITOT, BILIDIR, IBILI in the last 72 hours. PT/INR No results for input(s): LABPROT, INR in the last 72 hours.  Studies/Results: Dg Chest Port 1 View  Result Date: 10/05/2015 CLINICAL DATA:  Respiratory failure. EXAM: PORTABLE CHEST 1 VIEW COMPARISON:  10/03/2015. FINDINGS:  Rotated film is suboptimal. Unchanged tubes and lines. ET tube 4.3 cm above carina. RIGHT upper lobe infiltrate is stable. Unchanged cardiomediastinal silhouette. Telemetry leads. IMPRESSION: Stable chest.  RIGHT upper lobe infiltrate. Electronically Signed   By: Staci Righter M.D.   On: 10/05/2015 10:33       Assessment / Plan:   60 y/o female with multiple medical problems, including respiratory failure from HAP on ventilator, with upper GI bleeding. EGD on 9/11 showed a cratered duodenal bulb ulcer with adherent clot on, however this was not bleeding at the time of the exam. The exam otherwise showed a suspected Dieulafoy lesion that was actively bleeding on the opposite side of the distal duodenal bulb wall which was treated with hemostasis clips and epinephrine. The patient had recurrent bleeding yet again, with drop in Hgb yesterday. EGD repeated yesterday afternoon, showing clean based cratered duodenal ulcer (no adherent clot), 3 hemostasis clips in place at site of Dieulafoy lesion, both without evidence of bleeding or stigmata of bleeding. A small ulceration was noted at the site of Dieulafoy treatment, perhaps related to recent endoscopic therapy, which I clipped, but unclear if related at all to her bleeding symptoms.   No evidence of bleeding since yesterday, her Hgb is stable. Renal function worsening however. Overall, unclear if  the large duodenal ulcer had re-bled, but given it was clean based without stigmata for bleeding, endoscopic therapy was not performed. She's previously had negative H pylori testing.   Recommend at this time: - continue IV protonix '40mg'$  BID - please minimize blood draws, CBC once daily if no evidence of bleeding - okay to resume tube feeds today  Please call with any questions or changes in her status.   [place] Cellar, MD [facility]astroenterology Pager [phone]

## 2015-10-07 NOTE — Progress Notes (Signed)
eLink Physician-Brief Progress Note Patient Name: Megan Sims DOB: 1955-06-20 MRN: [id number]   Date of Service  10/07/2015  HPI/Events of Note  K=3.2  eICU Interventions  Replaced      Intervention Category Intermediate Interventions: Electrolyte abnormality - evaluation and management  Avni Traore 10/07/2015, 5:14 AM

## 2015-10-08 ENCOUNTER — Inpatient Hospital Stay (HOSPITAL_COMMUNITY): Payer: Medicaid Other

## 2015-10-08 LAB — H PYLORI, IGM, IGG, IGA AB
H PYLORI IGG: 1.5 U/mL — AB (ref 0.0–0.8)
H. Pylogi, Iga Abs: <9 units (ref 0.0–8.9)
H. Pylogi, Igm Abs: <9 units (ref 0.0–8.9)

## 2015-10-08 LAB — GLUCOSE, CAPILLARY
GLUCOSE-CAPILLARY: 146 mg/dL — AB (ref 65–99)
GLUCOSE-CAPILLARY: 162 mg/dL — AB (ref 65–99)
GLUCOSE-CAPILLARY: 120 mg/dL — AB (ref 65–99)
GLUCOSE-CAPILLARY: 140 mg/dL — AB (ref 65–99)
Glucose-Capillary: 102 mg/dL — ABNORMAL HIGH (ref 65–99)
Glucose-Capillary: 98 mg/dL (ref 65–99)

## 2015-10-08 LAB — CBC
HEMATOCRIT: 26.7 % — AB (ref 36.0–46.0)
Hemoglobin: 8.4 g/dL — ABNORMAL LOW (ref 12.0–15.0)
MCH: 29.2 pg (ref 26.0–34.0)
MCHC: 31.5 g/dL (ref 30.0–36.0)
MCV: 92.7 fL (ref 78.0–100.0)
PLATELETS: 70 10*3/uL — AB (ref 150–400)
RBC: 2.88 MIL/uL — AB (ref 3.87–5.11)
RDW: 17.6 % — ABNORMAL HIGH (ref 11.5–15.5)
WBC: 8.2 10*3/uL (ref 4.0–10.5)

## 2015-10-08 LAB — BASIC METABOLIC PANEL
Anion gap: 9 (ref 5–15)
BUN: 85 mg/dL — AB (ref 6–20)
CHLORIDE: 119 mmol/L — AB (ref 101–111)
CO2: 22 mmol/L (ref 22–32)
CREATININE: 2.34 mg/dL — AB (ref 0.44–1.00)
Calcium: 7.7 mg/dL — ABNORMAL LOW (ref 8.9–10.3)
GFR calc Af Amer: 25 mL/min — ABNORMAL LOW (ref >60)
GFR calc non Af Amer: 21 mL/min — ABNORMAL LOW (ref >60)
GLUCOSE: 133 mg/dL — AB (ref 65–99)
Potassium: 2.8 mmol/L — ABNORMAL LOW (ref 3.5–5.1)
SODIUM: 150 mmol/L — AB (ref 135–145)

## 2015-10-08 LAB — HEMOGLOBIN AND HEMATOCRIT, BLOOD
HCT: 27.4 % — ABNORMAL LOW (ref 36.0–46.0)
HEMATOCRIT: 26.7 % — AB (ref 36.0–46.0)
HEMATOCRIT: 26.5 % — AB (ref 36.0–46.0)
HEMATOCRIT: 27.6 % — AB (ref 36.0–46.0)
Hemoglobin: 8.5 g/dL — ABNORMAL LOW (ref 12.0–15.0)
Hemoglobin: 8.6 g/dL — ABNORMAL LOW (ref 12.0–15.0)
Hemoglobin: 8.7 g/dL — ABNORMAL LOW (ref 12.0–15.0)
Hemoglobin: 8.8 g/dL — ABNORMAL LOW (ref 12.0–15.0)

## 2015-10-08 LAB — MAGNESIUM
Magnesium: 1.9 mg/dL (ref 1.7–2.4)
Magnesium: 1.7 mg/dL (ref 1.7–2.4)

## 2015-10-08 LAB — PHOSPHORUS
PHOSPHORUS: 4.5 mg/dL (ref 2.5–4.6)
PHOSPHORUS: 4.4 mg/dL (ref 2.5–4.6)

## 2015-10-08 LAB — POTASSIUM: Potassium: 3.7 mmol/L (ref 3.5–5.1)

## 2015-10-08 LAB — PROTIME-INR
INR: 1.35
PROTHROMBIN TIME: 16.7 s — AB (ref 11.4–15.2)

## 2015-10-08 MED ORDER — VITAL AF 1.2 CAL PO LIQD
1000.0000 mL | ORAL | Status: DC
  Administered 2015-10-10 – 2015-10-13 (×5): 1000 mL
  Filled 2015-10-08 (×2): qty 1000

## 2015-10-08 MED ORDER — POTASSIUM CHLORIDE 20 MEQ/15ML (10%) PO SOLN: 40.0000 meq | ORAL | Status: DC

## 2015-10-08 MED ORDER — POTASSIUM CHLORIDE 20 MEQ/15ML (10%) PO SOLN
40.0000 meq | ORAL | Status: AC
  Administered 2015-10-08 (×2): 40 meq
  Filled 2015-10-08 (×2): qty 30

## 2015-10-08 MED ORDER — TECHNETIUM TC 99M-LABELED RED BLOOD CELLS IV KIT
25.0000 | PACK | Freq: Once | INTRAVENOUS | Status: AC | PRN
  Administered 2015-10-08: 25 via INTRAVENOUS

## 2015-10-08 MED ORDER — FUROSEMIDE 10 MG/ML IJ SOLN
20.0000 mg | Freq: Every day | INTRAMUSCULAR | Status: DC
  Administered 2015-10-08 – 2015-10-16 (×9): 20 mg via INTRAVENOUS
  Filled 2015-10-08 (×5): qty 2
  Filled 2015-10-08: qty 4
  Filled 2015-10-08: qty 2
  Filled 2015-10-08: qty 4
  Filled 2015-10-08: qty 2
  Filled 2015-10-08: qty 4

## 2015-10-08 MED ORDER — PRO-STAT SUGAR FREE PO LIQD
30.0000 mL | Freq: Every day | ORAL | Status: DC
  Administered 2015-10-10 – 2015-10-11 (×2): 30 mL
  Filled 2015-10-08 (×4): qty 30

## 2015-10-08 NOTE — Progress Notes (Signed)
Patient with medium size bloody stool maroon to reddish color . Dr Havery Moros and Dr DeDios informed .H&H ordered for 1100 and RT to hold on extubation. I also called her Husband to update him.

## 2015-10-08 NOTE — Progress Notes (Signed)
GI UPDATE  Patient had large maroon stool and became tachycardic this AM. We flushed some water through the PEG and aspirated to evaluate for blood in the stomach but was not able to aspirate much, but no blood noted. Repeat Hgb is stable, although what the patient passed did not appear to be old blood.  The last time she had recurrent bleeding her upper endoscopy did not reveal any active bleeding or any high risk stigmata. Source of bleeding at this time is either the duodenal ulcer, the dieulafoy, or perhaps another more distal source. I discussed her case with Dr. Kathlene Cote of IR. Given her renal function, will avoid CTA at this time, but will proceed with bleeding scan to see if she is actively bleeding, and if so, confirm location and ensure no other source. If she does have active bleeding from the duodenum, will either repeat endoscopy once more or embolization with IR, and will discuss this further with them. Discussed with family who agreed.   [place] Cellar, MD [facility] Pager [phone]

## 2015-10-08 NOTE — Progress Notes (Signed)
Nutrition Consult/Follow Up  DOCUMENTATION CODES:   Not applicable  INTERVENTION:    As medically appropriate, initiate Vital AF 1.2 at goal rate of 50 ml/h (1200 ml per day) and Prostat 30 ml daily to provide 1540 kcals, 105 gm protein, 973 ml free water daily.  NUTRITION DIAGNOSIS:   Inadequate oral intake related to inability to eat as evidenced by NPO status.  Ongoing.  GOAL:   Patient will meet greater than or equal to 90% of their needs  Currently unmet.  MONITOR:   Vent status, Labs, Weight trends, I & O's, TF tolerance  ASSESSMENT:   60 y.o.femalewith medical history significant for Lung cancer, not currently on treatment, history of dermatomyositis, CHF,and a recent admission for GI bleed due to duodenal ulcer on PPI (H pylori by US biopsy pending) from 9/5 through 09/30/2015,at which time, she was diagnosed with HCAP, sent home with Levaquin, failing to take the medicine, now presenting on 9/8 with worsening respiratory symptoms. Intubated 9/9 for respiratory failure. Patient continues to have BRBPR.   Patient is currently intubated on ventilator support MV: 13.5 L/min Temp (24hrs), Avg:98.2 F (36.8 C), Min:97.8 F (36.6 C), Max:98.9 F (37.2 C)  Pt s/p upper GI endoscopy 9/11. Vital High Protein formula & Prostat liquid protein ordered 9/14 via Adult Tube Feeding Protocol. Formula previously infusing at goal rate of 40 ml/hr via PEG tube. Spoke with RN. Currently off due to maroon stooling >> GI note reviewed >> ? active bleeding.  *At home pt receives 1.5 cans Jevity 1.2 every 4 hours QID via PEG tube. Total of 6 cans provides 1710 kcal, 79 grams protein, 1146 ml free water. NPO at home except for ice chips with history of dysphagia.   Diet Order:  Diet NPO time specified  Skin:  (S) Wound (see comment) (Stage II to buttock)  Last BM:  9/12  Height:   Ht Readings from Last 1 Encounters:  10/13/2015 '5\' 1"'$  (1.549 m)    Weight:   Wt Readings from  Last 1 Encounters:  10/08/15 143 lb 11.8 oz (65.2 kg)    Ideal Body Weight:  47.73 kg  BMI:  Body mass index is 27.16 kg/m.  Estimated Nutritional Needs:   Kcal:  1528  Protein:  95-105 gm  Fluid:  per MD  EDUCATION NEEDS:   No education needs identified at this time  Arthur Holms, RD, LDN Pager #: [phone] After-Hours Pager #: [phone]

## 2015-10-08 NOTE — Progress Notes (Signed)
  [facility] PCCM  Pt had a large maroon colored stool while doing PST. HR from 90s went up to 120s.  I discussed the case with GI. Plan to observe Hb and Hct. If with significant drop, will need to update GI, get RBC scan and maybe ask IR if they can do anything with regards to the bleed. Pt has a big DU but GI scoped her 3x already and the DU was NOT noted to be bleeding. She can have occult bleed elsewhere.  Keep intubated for now. Keep NPO.  Family updated.   Monica Becton, MD 10/08/2015, 11:28 AM [place] [facility] Pager [phone] After 3 pm or if no answer, call [phone]

## 2015-10-08 NOTE — Consult Note (Signed)
Chief Complaint: Patient was seen in consultation today for mesenteric arteriogram with possible ebolization Chief Complaint  Patient presents with  . multiple complaints   at the request of Dr [place]grove Cellar  Referring Physician(s): Dr [place] Cellar  Supervising Physician: Aletta Edouard  Patient Status: Inpatient  History of Present Illness: Megan Sims is a 60 y.o. female   GI bleed Cratered duodenal ulcer and dieulafoy lesion 2 EGD this week No active bleeding source and no high risk stigmata per Dr Havery Moros notes Large maroon stool this am Now for Nuclear medicine bleeding scan (pt with Cr 2.34---will defer CTA) If + Nuc Med studay---Dr Havery Moros will determine oif additional scope is prudent Or IR Mesenteric arteriogram with possible embolization  Dr Kathlene Cote has reviewed imaging and has discussed with Dr Pieter Partridge procedure and plan  Hg stable 8.4 and 8.6 today  Past Medical History:  Diagnosis Date  . Anemia 08/03/2015  . Cancer [facility])    malignant neoplasm of unknown origin  . Cerebral aneurysm 2 brain surgeries 96 or 97  . Chronic neck pain 09/24/2014  . Diarrhea   . Emphysema of lung ([place]) 05/05/2015  . Hypertension   . Hyponatremia   . Left knee pain   . Left leg weakness 06/2015  . Lung abnormality   . Lung cancer ([place]) 10/07/2014  . Malignant neoplasm of unknown origin ([place])   . Migraine   . Nicotine dependence 09/24/2014  . Oral thrush 09/24/2014  . Paget disease of bone   . Pleurisy   . Renal insufficiency    Patient states " no kidney problems  . S/P biopsy    of throat per patient.  . Skin ulcer ([place]) 11/18/2014  . 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 CEREBRAL ANEURYSM REPAIR  96 or 97  . CHEST TUBE INSERTION    . COLONOSCOPY     10 years ago in [place]   . ESOPHAGOGASTRODUODENOSCOPY  06/16/2015   Dr. Gala Romney: edentulous cricopharyngeus, esophageal  plaques, biopsy consistent with candida  . ESOPHAGOGASTRODUODENOSCOPY N/A 06/16/2015   Procedure: ESOPHAGOGASTRODUODENOSCOPY (EGD);  Surgeon: Daneil Dolin, MD;  [facility];  Service: Endoscopy;  Laterality: N/A;  . ESOPHAGOGASTRODUODENOSCOPY N/A 07/06/2015   Procedure: ESOPHAGOGASTRODUODENOSCOPY (EGD);  Surgeon: Danie Binder, MD;  [facility];  Service: Endoscopy;  Laterality: N/A;  . ESOPHAGOGASTRODUODENOSCOPY N/A 09/29/2015   Procedure: ESOPHAGOGASTRODUODENOSCOPY (EGD);  Surgeon: Irene Shipper, MD;  [facility];  Service: Endoscopy;  Laterality: N/A;  . ESOPHAGOGASTRODUODENOSCOPY N/A 10/07/2015   Procedure: ESOPHAGOGASTRODUODENOSCOPY (EGD);  Surgeon: Manus Gunning, MD;  Location: [place];  Service: Gastroenterology;  Laterality: N/A;  . ESOPHAGOGASTRODUODENOSCOPY N/A 10/08/2015   Procedure: ESOPHAGOGASTRODUODENOSCOPY (EGD);  Surgeon: Manus Gunning, MD;  Location: [place];  Service: Gastroenterology;  Laterality: N/A;  . INTRAMEDULLARY (IM) NAIL INTERTROCHANTERIC Left 08/07/2015   Procedure: INTRAMEDULLARY (IM) NAIL INTERTROCHANTRIC;  Surgeon: Melrose Nakayama, MD;  Location: [facility];  Service: Orthopedics;  Laterality: Left;  . LARYNGOSCOPY  Jun 22, 2015   [facility], Dr. Erik Obey: normal oropharynx, no lesions, mobile vocal cords with good airway.  . PEG PLACEMENT N/A 07/06/2015   Procedure: PERCUTANEOUS ENDOSCOPIC GASTROSTOMY (PEG) PLACEMENT;  Surgeon: Danie Binder, MD;  [facility];  Service: Endoscopy;  Laterality: N/A;  . TUBAL LIGATION      Allergies: Hydrocodone-acetaminophen; Demeclocycline; Tetracyclines & related; and Tramadol  Medications: Prior to Admission medications   Medication Sig  Start Date End Date Taking? Authorizing Provider  albuterol (PROVENTIL HFA;VENTOLIN HFA) 108 (90 Base) MCG/ACT inhaler Inhale 1-2 puffs into the lungs every 6 (six) hours as needed for wheezing or shortness of breath.   Yes  Historical Provider, MD  Amino Acids-Protein Hydrolys (FEEDING SUPPLEMENT, PRO-STAT SUGAR FREE 64,) LIQD Place 30 mLs into feeding tube daily. 09/30/15  Yes Jessica U Vann, DO  amLODipine (NORVASC) 10 MG tablet Take 1 tablet (10 mg total) by mouth daily. 08/30/15  Yes Heath Lark, MD  fentaNYL (DURAGESIC - DOSED MCG/HR) 25 MCG/HR patch Place 1 patch (25 mcg total) onto the skin every 3 (three) days. 09/21/15  Yes Heath Lark, MD  furosemide (LASIX) 20 MG tablet Place 20 mg into feeding tube daily.   Yes Historical Provider, MD  hydrocortisone 2.5 % ointment Apply 1 application topically 2 (two) times daily. To face 09/20/15  Yes Historical Provider, MD  metoprolol tartrate (LOPRESSOR) 25 MG tablet Take 0.5 tablets (12.5 mg total) by mouth 2 (two) times daily. 09/10/15  Yes Donne Hazel, MD  morphine (ROXANOL) 20 MG/ML concentrated solution Place 1 mL (20 mg total) into feeding tube every 2 (two) hours as needed for severe pain. 09/21/15  Yes Heath Lark, MD  Nutritional Supplements (FEEDING SUPPLEMENT, JEVITY 1.2 CAL,) LIQD Place 392 mLs into feeding tube every 3 (three) hours. 09/30/15  Yes Geradine Girt, DO  nystatin (MYCOSTATIN) 100000 UNIT/ML suspension Take 5 mLs by mouth 4 (four) times daily. Swish and spit for thrush 09/20/15  Yes Historical Provider, MD  pantoprazole sodium (PROTONIX) 40 mg/20 mL PACK Place 20 mLs (40 mg total) into feeding tube 2 (two) times daily. 09/30/15  Yes Geradine Girt, DO  predniSONE (DELTASONE) 5 MG tablet Take by mouth once daily as directed: 15 mg daily for 3 days the 10 mg daily. Patient taking differently: Take 10 mg by mouth daily with breakfast.  09/24/15  Yes Heath Lark, MD  triamcinolone ointment (KENALOG) 0.1 % Apply 1 application topically 2 (two) times daily. From neck down. Do not use on face.. 09/20/15  Yes Historical Provider, MD  Water For Irrigation, Sterile (FREE WATER) SOLN Place 100 mLs into feeding tube every 6 (six) hours. 09/30/15  Yes Geradine Girt, DO    zolpidem (AMBIEN) 10 MG tablet Take 10 mg by mouth at bedtime as needed for sleep (sleep).   Yes Historical Provider, MD  levofloxacin (LEVAQUIN) 750 MG tablet Place 1 tablet (750 mg total) into feeding tube daily. 09/30/15   Geradine Girt, DO     Family History  Problem Relation Age of Onset  . Hypertension    . Diabetes    . Kidney disease    . Cancer Mother     throat ca  . Cancer Maternal Grandmother     thyroid ca  . Colon cancer Neg Hx     Social History   Social History  . Marital status: Married    Spouse name: N/A  . Number of children: N/A  . Years of education: N/A   Social History Main Topics  . Smoking status: Current Every Day Smoker    Packs/day: 0.50    Years: 34.00    Types: Cigarettes  . Smokeless tobacco: Never Used     Comment: HOWEVER, will smoke 1 cigarette if "tragic" event (death), or if she needs it for her nerves   . Alcohol use No  . Drug use: No  . Sexual activity: Not Asked  Other Topics Concern  . None   Social History Narrative  . None     Review of Systems: A 12 point ROS discussed and pertinent positives are indicated in the HPI above.  All other systems are negative.  Review of Systems  Constitutional: Positive for activity change, appetite change and fatigue. Negative for fever.  Cardiovascular: Negative for chest pain.  Neurological: Positive for weakness.  Psychiatric/Behavioral: Negative for behavioral problems and confusion.    Vital Signs: BP 121/60   Pulse (!) 117   Temp 98 F (36.7 C) (Oral)   Resp (!) 25   Ht '5\' 1"'$  (1.549 m)   Wt 143 lb 11.8 oz (65.2 kg)   SpO2 93%   BMI 27.16 kg/m   Physical Exam  Cardiovascular: Normal rate and regular rhythm.   Pulmonary/Chest:  vent  Abdominal: Soft. Bowel sounds are normal.  Neurological: She is alert.  Skin: Skin is warm and dry.  Psychiatric:  Discussed procedure with pt and husband Good understanding Consented with husband at bedside  Nursing note and  vitals reviewed.   Mallampati Score:  MD Evaluation Airway: Other (comments) Airway comments: vent Heart: WNL Abdomen: WNL Chest/ Lungs: Other (comments) Chest/ lungs comments: vent ASA  Classification: 3 Mallampati/Airway Score: Three  Imaging: Dg Chest 2 View  Result Date: 09/28/2015 CLINICAL DATA:  Fever. EXAM: CHEST  2 VIEW COMPARISON:  Radiographs of September 21, 2015. FINDINGS: The heart size and mediastinal contours are within normal limits. No pneumothorax or pleural effusion is noted. Left internal jugular Port-A-Cath is unchanged in position. Left lung is clear. Increased right upper lobe opacity is noted concerning for worsening pneumonia or atelectasis. Atherosclerosis of thoracic aorta is noted. The visualized skeletal structures are unremarkable. IMPRESSION: Aortic atherosclerosis. Mildly increased right upper lobe opacity concerning for worsening atelectasis or pneumonia. Electronically Signed   By: Marijo Conception, M.D.   On: 09/28/2015 17:42   Dg Chest 2 View  Result Date: 09/21/2015 CLINICAL DATA:  Pt takes HTN meds. Not diabetic. Current long time smoker. Coughing. Congestion. Nausea and vomiting. Central chest pains for a few days. EXAM: CHEST  2 VIEW COMPARISON:  09/15/2015 FINDINGS: Cardiac silhouette is normal in size and configuration. No mediastinal or hilar masses or evidence of adenopathy. There is opacity along the oblique fissure projecting within the right middle lobe, stable the prior study. Lungs otherwise clear. No pleural effusion or pneumothorax. Left anterior chest wall Port-A-Cath is stable. Skeletal structures are intact. IMPRESSION: 1. Right middle lobe consolidation/atelectasis bordering the oblique fissure is stable from the prior study. 2. Remainder of the lungs is clear. Overall no change from the prior study. Electronically Signed   By: Lajean Manes M.D.   On: 09/21/2015 11:19   Dg Chest 2 View  Result Date: 09/15/2015 CLINICAL DATA:  Pneumonia.  Cough. Emphysema. Lung cancer. Current smoker. EXAM: CHEST  2 VIEW COMPARISON:  09/07/2015 FINDINGS: A left Port-A-Cath which terminates at the mid SVC. Midline trachea. Normal heart size. Atherosclerosis in the transverse aorta. Right hemidiaphragm elevation. No pleural effusion or pneumothorax. Right middle lobe collapse/ consolidative change is similar to the prior exam but new since 08/27/2015 Clear left lung. IMPRESSION: Similar right middle lobe collapse/consolidative change. Given interval development since 08/27/2015, most consistent with persistent pneumonia. Electronically Signed   By: Abigail Miyamoto M.D.   On: 09/15/2015 18:29   US Venous Img Upper Uni Left  Result Date: 09/16/2015 CLINICAL DATA:  Acute left arm swelling for 1 week EXAM: LEFT  UPPER EXTREMITY VENOUS DOPPLER ULTRASOUND TECHNIQUE: Gray-scale sonography with graded compression, as well as color Doppler and duplex ultrasound were performed to evaluate the upper extremity deep venous system from the level of the subclavian vein and including the jugular, axillary, basilic, radial, ulnar and upper cephalic vein. Spectral Doppler was utilized to evaluate flow at rest and with distal augmentation maneuvers. COMPARISON:  None. FINDINGS: Contralateral Subclavian Vein: Respiratory phasicity is normal and symmetric with the symptomatic side. No evidence of thrombus. Normal compressibility. Internal Jugular Vein: No evidence of thrombus. Normal compressibility, respiratory phasicity and response to augmentation. Subclavian Vein: No evidence of thrombus. Normal compressibility, respiratory phasicity and response to augmentation. Axillary Vein: No evidence of thrombus. Normal compressibility, respiratory phasicity and response to augmentation. Cephalic Vein: No evidence of thrombus. Normal compressibility, respiratory phasicity and response to augmentation. Basilic Vein: No evidence of thrombus. Normal compressibility, respiratory phasicity and  response to augmentation. Brachial Veins: No evidence of thrombus. Normal compressibility, respiratory phasicity and response to augmentation. Radial Veins: No evidence of thrombus. Normal compressibility, respiratory phasicity and response to augmentation. Ulnar Veins: No evidence of thrombus. Normal compressibility, respiratory phasicity and response to augmentation. Venous Reflux:  None visualized. Other Findings:  Diffuse subcutaneous edema evident. IMPRESSION: No evidence of deep venous thrombosis. Electronically Signed   By: Jerilynn Mages.  Shick M.D.   On: 09/16/2015 11:19   Dg Chest Port 1 View  Result Date: 10/08/2015 CLINICAL DATA:  Respiratory failure, lung malignancy. EXAM: PORTABLE CHEST 1 VIEW COMPARISON:  Chest x-ray of October 05, 2015 FINDINGS: The lungs are well-expanded. Increased conspicuity of the right upper lobe infiltrate is noted. There are coarse infrahilar lung markings in the right lower lobe. The retrocardiac region on the left remains dense. The hemidiaphragm remains obscured. The endotracheal tube tip lies 5.1 cm above the carina. The right internal jugular venous catheter tip projects over the midportion of the SVC. The power port catheter tip projects over the proximal SVC. There is calcification in the wall of the aortic arch. IMPRESSION: Interval increased conspicuity of the right perihilar and upper lobe infiltrate. Infiltrate in the right infrahilar region and in the left lower lobe is more conspicuous. There is a small left pleural effusion. Electronically Signed   By: David  Martinique M.D.   On: 10/08/2015 07:29   Dg Chest Port 1 View  Result Date: 10/05/2015 CLINICAL DATA:  Respiratory failure. EXAM: PORTABLE CHEST 1 VIEW COMPARISON:  10/03/2015. FINDINGS: Rotated film is suboptimal. Unchanged tubes and lines. ET tube 4.3 cm above carina. RIGHT upper lobe infiltrate is stable. Unchanged cardiomediastinal silhouette. Telemetry leads. IMPRESSION: Stable chest.  RIGHT upper lobe  infiltrate. Electronically Signed   By: Staci Righter M.D.   On: 10/05/2015 10:33   Dg Chest Port 1 View  Result Date: 10/03/2015 CLINICAL DATA:  Respiratory failure. EXAM: PORTABLE CHEST 1 VIEW COMPARISON:  10/02/2015 FINDINGS: Tracheal tube tip is 4.1 cm above the base of the carina. Right IJ central line tip overlies the mid to distal SVC. The cardiopericardial silhouette is within normal limits for size. Right parahilar opacity persists. Telemetry leads overlie the chest. IMPRESSION: No substantial change. Electronically Signed   By: Misty Stanley M.D.   On: 10/03/2015 08:11   Dg Chest Port 1 View  Result Date: 10/02/2015 CLINICAL DATA:  Central line and endotracheal tube placement. EXAM: PORTABLE CHEST 1 VIEW COMPARISON:  10/02/2015, earlier the same day. FINDINGS: 0925 hours. Endotracheal tube tip is approximately 4.2 cm above the base of the carina. Right IJ  central line tip is obscured by defibrillator device, but is in the region of the SVC/RA junction. Left Port-A-Cath remains in place. Persistent right suprahilar opacity compatible with pneumonia or neoplasm. Telemetry leads overlie the chest. IMPRESSION: 1. Endotracheal tube tip 4.2 cm above the base the chronic. 2. Right IJ central line tip obscured by defibrillator pad, but positioned over the region of the distal SVC near the RA junction. Electronically Signed   By: Misty Stanley M.D.   On: 10/02/2015 10:21   Dg Chest Port 1 View  Result Date: 10/02/2015 CLINICAL DATA:  Sepsis. EXAM: PORTABLE CHEST 1 VIEW COMPARISON:  Radiograph of October 01, 2015. FINDINGS: Stable cardiomediastinal silhouette. Left internal jugular Port-A-Cath is unchanged in position. No pneumothorax is noted. Left lung is clear. No pleural effusion is noted. Stable right upper lobe opacity is noted in right hilar prominence is noted consistent with history of lung cancer and possible associated atelectasis or pneumonia. Bony thorax is unremarkable. IMPRESSION: Stable  right upper lobe opacity is described above. Electronically Signed   By: Marijo Conception, M.D.   On: 10/02/2015 10:11   Dg Chest Portable 1 View  Result Date: 10/20/2015 CLINICAL DATA:  Abdominal pain.  Nausea.  Fever .  Lung cancer. EXAM: PORTABLE CHEST 1 VIEW COMPARISON:  09/28/2015.  CT 08/27/2015. FINDINGS: PowerPort catheter in stable position. Heart size stable. Worsening infiltrate in the the right upper lobe. Underlying mass cannot be excluded. Mild left base infiltrate noted. Small left pleural effusion. No pneumothorax . IMPRESSION: 1. PowerPort catheter noted in good anatomic position. 2. Progress infiltrate right upper lobe with possible underlying mass in this patient with known history of lung cancer. 3. Mild left base infiltrate consistent pneumonia. Small left pleural effusion. 4. Stable mild cardiomegaly. Electronically Signed   By: Marcello Moores  Register   On: 10/18/2015 11:54    Labs:  CBC:  Recent Labs  10/05/15 0435  10/18/2015 0435  10/07/15 0354 10/07/15 1448 10/08/15 0715 10/08/15 1100  WBC 15.5*  --  14.0*  --  9.2  --  8.2  --   HGB 8.0*  < > 8.7*  < > 8.5* 8.8* 8.4*  8.5* 8.6*  HCT 23.2*  < > 26.3*  < > 25.6* 27.4* 26.7*  26.7* 26.5*  PLT 93*  --  82*  --  76*  --  70*  --   < > = values in this interval not displayed.  COAGS:  Recent Labs  08/12/15 0830 08/13/15 0355 09/08/15 1230 09/28/15 1831 10/07/2015 1911  INR 1.06  --  0.97 1.07 1.27  APTT '26 25 27 27  '$ --     BMP:  Recent Labs  10/17/2015 0435 10/07/15 0354 10/07/15 1448 10/08/15 0600  NA 148* 149* 150* 150*  K 3.8 3.2* 3.6 2.8*  CL 119* 119* 120* 119*  CO2 19* 18* 19* 22  GLUCOSE 100* 111* 128* 133*  BUN 77* 77* 79* 85*  CALCIUM 7.3* 7.5* 7.8* 7.7*  CREATININE 2.06* 2.34* 2.36* 2.34*  GFRNONAA 25* 21* 21* 21*  GFRAA 29* 25* 25* 25*    LIVER FUNCTION TESTS:  Recent Labs  09/28/15 1835 09/29/15 0415 10/02/2015 1235 10/02/15 0545  BILITOT 0.2* 0.5 0.8 0.5  AST 50* 42* 47* 48*  ALT  '14 14 14 '$ 13*  ALKPHOS 49 42 48 32*  PROT 5.3* 4.7* 4.2* 3.2*  ALBUMIN 2.8* 2.4* 2.0* 1.4*    TUMOR MARKERS: No results for input(s): AFPTM, CEA, CA199, CHROMGRNA in the last 8760  hours.  Assessment and Plan:  GI bleed New maroon stool this am Hg stable EGD x 2--no source Now for Nuc Med bleeding scan Possible mesenteric arteriogram with possible embolization Risks and Benefits discussed with the patient including, but not limited to bleeding, infection, vascular injury or contrast induced renal failure. All of the patient's questions were answered, patient is agreeable to proceed. Consent signed and in chart.  Thank you for this interesting consult.  I greatly enjoyed meeting ZAKIYAH DIOP and look forward to participating in their care.  A copy of this report was sent to the requesting provider on this date.  Electronically Signed: Stormie Ventola A 10/08/2015, 1:43 PM   I spent a total of 40 Minutes    in face to face in clinical consultation, greater than 50% of which was counseling/coordinating care for mesenteric arteriogram with poss embolization

## 2015-10-08 NOTE — Progress Notes (Signed)
Sent Stat PT/INR to lab.  Not on worklist so unable to collect in system.

## 2015-10-08 NOTE — Progress Notes (Signed)
Progress Note   Subjective  Patient reported had a 1 bowel movement overnight, dark stool reported. She denies abdominal pain. Hgb relatively stable since 9/13. Tube feeds resumed yesterday.   Objective   Vital signs in last 24 hours: Temp:  [97.7 F (36.5 C)-98.9 F (37.2 C)] 98.9 F (37.2 C) (09/15 0759) Pulse Rate:  [93-118] 118 (09/15 0810) Resp:  [12-30] 30 (09/15 0810) BP: (89-154)/(48-76) 137/72 (09/15 0810) SpO2:  [93 %-100 %] 93 % (09/15 0812) FiO2 (%):  [40 %] 40 % (09/15 0812) Weight:  [143 lb 11.8 oz (65.2 kg)] 143 lb 11.8 oz (65.2 kg) (09/15 0500) Last BM Date: 10/05/15 General:    AA female, intubated, awake and able to nod yes / no to questioning Heart:  tachycardic Lungs: Respirations even and unlabored, Abdomen:  Soft, nontender and nondistended.  Extremities:  (+) 2 edema upper and lower extremities. Neurologic:   grossly normal neurologically. Psych:  Cooperative.   Intake/Output from previous day: 09/14 0701 - 09/15 0700 In: 890 [I.V.:200; NG/GT:640; IV Piggyback:50] Out: 3640 [Urine:3640] Intake/Output this shift: Total I/O In: 50 [I.V.:10; NG/GT:40] Out: 150 [Urine:150]  Lab Results:  Recent Labs  10/07/2015 0435  10/07/15 0354 10/07/15 1448 10/08/15 0715  WBC 14.0*  --  9.2  --   --   HGB 8.7*  < > 8.5* 8.8* 8.5*  HCT 26.3*  < > 25.6* 27.4* 26.7*  PLT 82*  --  76*  --   --   < > = values in this interval not displayed. BMET  Recent Labs  10/07/15 0354 10/07/15 1448 10/08/15 0600  NA 149* 150* 150*  K 3.2* 3.6 2.8*  CL 119* 120* 119*  CO2 18* 19* 22  GLUCOSE 111* 128* 133*  BUN 77* 79* 85*  CREATININE 2.34* 2.36* 2.34*  CALCIUM 7.5* 7.8* 7.7*   LFT No results for input(s): PROT, ALBUMIN, AST, ALT, ALKPHOS, BILITOT, BILIDIR, IBILI in the last 72 hours. PT/INR No results for input(s): LABPROT, INR in the last 72 hours.  Studies/Results: Dg Chest Port 1 View  Result Date: 10/08/2015 CLINICAL DATA:  Respiratory failure,  lung malignancy. EXAM: PORTABLE CHEST 1 VIEW COMPARISON:  Chest x-ray of October 05, 2015 FINDINGS: The lungs are well-expanded. Increased conspicuity of the right upper lobe infiltrate is noted. There are coarse infrahilar lung markings in the right lower lobe. The retrocardiac region on the left remains dense. The hemidiaphragm remains obscured. The endotracheal tube tip lies 5.1 cm above the carina. The right internal jugular venous catheter tip projects over the midportion of the SVC. The power port catheter tip projects over the proximal SVC. There is calcification in the wall of the aortic arch. IMPRESSION: Interval increased conspicuity of the right perihilar and upper lobe infiltrate. Infiltrate in the right infrahilar region and in the left lower lobe is more conspicuous. There is a small left pleural effusion. Electronically Signed   By: David  Martinique M.D.   On: 10/08/2015 07:29       Assessment / Plan:   60 y/o female with multiple medical problems, including respiratory failure from HAP on ventilator, with upper GI bleeding due to cratered duodenal ulcer and dieulafoy lesion, both in the duodenal bulb. She has undergone 2 EGDs this week for recurrent bleeding, details as outlined in my previous notes. She had no evidence of bleeding nor high risk stigmata for bleeding on most recent endoscopy on 9/13, and Hgb has been relatively stable since that time. One  bowel movement overnight noted, unclear if this represented old blood. BUN remains elevated but she has an acute kidney injury.  H pylori AB was sent and pending   Recommend at this time: - continue IV protonix '40mg'$  BID - please minimize blood draws if no evidence of active bleeding - continue tube feeds   Please call with any questions or changes in her status concerning for active bleeding.   Cellar, MD [facility] Pager [phone]

## 2015-10-08 NOTE — Progress Notes (Signed)
Pt. Transported to nuclear medicine. RT stayed with pt. For 3 hours during the scan. Pt. Was transported back to 2M06 without any complications.

## 2015-10-08 NOTE — Progress Notes (Signed)
Pt taken to nuclear medicine with RT.

## 2015-10-08 NOTE — Progress Notes (Signed)
Pt returned back to 2M06 from nuclear medicine

## 2015-10-08 NOTE — Progress Notes (Signed)
PULMONARY / CRITICAL CARE MEDICINE   Name: Megan Sims MRN: [id number] DOB: 11-13-1955    ADMISSION DATE:  09/28/2015 CONSULTATION DATE:  10/02/2015  REFERRING MD:  [facility] Dr. Erlinda Hong  CHIEF COMPLAINT:  Unresponsiveness. Hypovolemic shock  HISTORY OF PRESENT ILLNESS:    Pt is a 60 y.o. female with medical history significant for Lung cancer, not currently on treatment, history of dermatomyositis, CHF,and a recent admission for GI bleed due to duodenal ulcer on PPI (H pylori by US biopsy pending) from 9/5 through 09/30/2015,at which time, she was diagnosed with HCAP, sent home with Levaquin, failing to take the medicine, now presenting with worsening respiratory symptoms.she reports increasing shortness of breath, increased mucus production in the setting of chronic hemoptysis, as well as generalized weakness. The patient denies fevers, chills or diaphoresis. She denies any headaches. She reports chest pain with cough, but no cardiac complaints. She denies any nausea vomiting or diarrhea. She denies any dysuria or gross hematuria.  At the emergency room, she was hemodynamically stable. Her hemoglobin was 8.4. She was started on broad-spectrum antibiotics. She received 2 L of IV fluids for possible sepsis. She was transferred to the floors. She had 2-3 large "dark colored" bowel movements overnight. This morning, the nurse walked in and patient was unresponsive. She was intubated for airway protection. Transferred to the ICU. Her hemoglobin dropped to 3.5 from 8.4.   SUBJECTIVE:  Had 1 BM last 24 hrs > maroon colored/melena Tachypneic on PST 9/14.  Making good urine.    VITAL SIGNS: BP (!) 110/56   Pulse (!) 110   Temp 98.5 F (36.9 C) (Oral)   Resp (!) 25   Ht '5\' 1"'$  (1.549 m)   Wt 65.2 kg (143 lb 11.8 oz)   SpO2 94%   BMI 27.16 kg/m   HEMODYNAMICS: CVP:  [1 mmHg-4 mmHg] 3 mmHg  VENTILATOR SETTINGS: Vent Mode: PRVC FiO2 (%):  [40 %] 40 % Set Rate:  [15 bmp] 15 bmp Vt Set:  [390 mL] 390  mL PEEP:  [5 cmH20] 5 cmH20 Pressure Support:  [8 cmH20] 8 cmH20 Plateau Pressure:  [12 cmH20-14 cmH20] 12 cmH20  INTAKE / OUTPUT: I/O last 3 completed shifts: In: 1000 [I.V.:310; NG/GT:640; IV Piggyback:50] Out: 1740 [Urine:4930]  PHYSICAL EXAMINATION: General:  . Comfortable. NAD Neuro:  CN grossly intact. No lateralizing signs noted. Supple neck. HEENT:  PERLA. (-) NVD.  ET tube in place. Cardiovascular:  Good s1/s2.  No S3, rub, gallop, murmur.  Lungs:  Fair air entry. Crackles at bases Abdomen:  Dec BS. Soft abdomen. (-) masses/tenderness.  Musculoskeletal:  Gr 2 edema. Swelling in her BUE. Warm extremities Skin:  (-) clubbing/cyanosi/rash  LABS:  BMET  Recent Labs Lab 09/25/2015 0435 10/07/15 0354 10/07/15 1448  NA 148* 149* 150*  K 3.8 3.2* 3.6  CL 119* 119* 120*  CO2 19* 18* 19*  BUN 77* 77* 79*  CREATININE 2.06* 2.34* 2.36*  GLUCOSE 100* 111* 128*    Electrolytes  Recent Labs Lab 10/03/2015 0435 10/07/15 0354 10/07/15 1127 10/07/15 1448 10/07/15 1703 10/08/15 0600  CALCIUM 7.3* 7.5*  --  7.8*  --   --   MG 2.1 1.9 2.0  --  2.1 1.9  PHOS 5.0* 5.7* 5.5*  --  5.5* 4.5    CBC  Recent Labs Lab 10/05/15 0435  10/02/2015 0435  10/07/15 0354 10/07/15 1448 10/08/15 0715  WBC 15.5*  --  14.0*  --  9.2  --   --  HGB 8.0*  < > 8.7*  < > 8.5* 8.8* 8.5*  HCT 23.2*  < > 26.3*  < > 25.6* 27.4* 26.7*  PLT 93*  --  82*  --  76*  --   --   < > = values in this interval not displayed.  Coag's  Recent Labs Lab 09/27/2015 1911  INR 1.27    Sepsis Markers  Recent Labs Lab 10/08/2015 1911  10/02/15 0540 10/02/15 0800 10/02/15 1735  LATICACIDVEN 4.8*  < > 10.3* 13.6* 2.6*  PROCALCITON 2.37  --   --   --   --   < > = values in this interval not displayed.  ABG  Recent Labs Lab 10/02/15 0954 10/02/15 1520 10/02/15 1700  PHART 7.180* 7.506* 7.471*  PCO2ART 37.2 29.8* 32.1  PO2ART 34.0* 317* 96.7    Liver Enzymes  Recent Labs Lab  10/03/2015 1235 10/02/15 0545  AST 47* 48*  ALT 14 13*  ALKPHOS 48 32*  BILITOT 0.8 0.5  ALBUMIN 2.0* 1.4*    Cardiac Enzymes  Recent Labs Lab 10/12/2015 1911 10/02/15 1736 10/03/15 0254  TROPONINI 0.10* 0.36* 0.22*    Glucose  Recent Labs Lab 10/07/15 0818 10/07/15 1215 10/07/15 1600 10/07/15 1937 10/07/15 2354 10/08/15 0322  GLUCAP 110* 110* 127* 145* 160* 146*    Imaging Dg Chest Port 1 View  Result Date: 10/08/2015 CLINICAL DATA:  Respiratory failure, lung malignancy. EXAM: PORTABLE CHEST 1 VIEW COMPARISON:  Chest x-ray of October 05, 2015 FINDINGS: The lungs are well-expanded. Increased conspicuity of the right upper lobe infiltrate is noted. There are coarse infrahilar lung markings in the right lower lobe. The retrocardiac region on the left remains dense. The hemidiaphragm remains obscured. The endotracheal tube tip lies 5.1 cm above the carina. The right internal jugular venous catheter tip projects over the midportion of the SVC. The power port catheter tip projects over the proximal SVC. There is calcification in the wall of the aortic arch. IMPRESSION: Interval increased conspicuity of the right perihilar and upper lobe infiltrate. Infiltrate in the right infrahilar region and in the left lower lobe is more conspicuous. There is a small left pleural effusion. Electronically Signed   By: David  Martinique M.D.   On: 10/08/2015 07:29     STUDIES:  Echo 9/9 > EF 65-70%, DD  CULTURES: Blood 9/9 > (-) Trache 9/9 > (-) MRSA 9/9 > (-)  ANTIBIOTICS: Vanc 9/8 > 9/11 Cefepime 9/8 - 9/15  SIGNIFICANT EVENTS: 9/8 admitted for possible HCAP.  9/9 intubated for respiratory failure. Transferred to ICU.  LINES/TUBES: L port R IJ 9/9 >   DISCUSSION: 63 female, history of lung cancer, recent admit for healthcare associated pneumonia and GI bleed secondary to duodenal ulcer, admitted for altered mental status, sepsis, possible healthcare associated pneumonia again.  Intubated for respiratory failure on 9/9 and profoundly anemic likely from GI source.    ASSESSMENT / PLAN:  PULMONARY A: Acute hypoxemic respiratory failure secondary to unable to protect airway, healthcare associated pneumonia right upper lung and right middle lung, concern for aspiration pneumonia.  Pulm edema H/O Lung CA P:   Cont vent support. Daily PST. May be extubatable today oor tomorrow.  Neb Treatments. Keep cefepime. Plan for 7d > d/c 9/15 Cont diuresis > will decrease dose  CARDIOVASCULAR A:  Hypovolemic shock 2/2 GI bleed. Resolved.  Sinus tachycardia P:  tele  RENAL A:   AKI 2/2 Hypovolemia Metabolic acidosis. Lactic acidosis. Hypernatremia and hyperchloremia > better P:  Will decrease lasix to 20 mg daily from BID. Making good urine.   GASTROINTESTINAL A:   Active GI bleed. Pt with DU but was noted NOT to be actively bleeding on 9/11. 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  S/P EGD on 9/6, 9/11, 9/13  P:   No transfusion last 48 hrs. Rpt EGD on 9/13 >> no bleeding.  Protonix BID Will d/c serial Hb and Hct  Has received 9upRBC since 9/9, 1 pack platelet on 9/11. No transfusion since 9/12 Cont TF   HEMATOLOGIC A:   Severe anemia. Hemorrhagic and hypovolemic shock secondary to GI bleed. P:  Transfuse packed red blood cells to keep Hb > 7.  Daily Hb and Hct  INFECTIOUS A:   HCAP, R mid-lower. Possible aspiration pneumonia. P:   Cont cefepime. Plan for 7d > end 9/15   ENDOCRINE A:   No issues.  P:   cbg q 4 SSI Pt takes pred 10 mg daily (?rheum d/o) > contHC  50 mg q 24 > switch to PO when eating  NEUROLOGIC A:   AMS / Encephalopathy P:   RASS goal: 0 - -1 PRN fentanyl.    FAMILY  - needs Family meeting on 9/14. Pt and family want her full code.  - Updates: - Inter-disciplinary family meet or Palliative Care meeting due by:  Sept 18  Critical care time with this patient today: 35  minutes   J. Shirl Harris, MD Pulmonary and [place] Pager: [phone] After 3 pm or if no response, call [phone]  10/08/2015, 7:52 AM

## 2015-10-09 LAB — BASIC METABOLIC PANEL
ANION GAP: 10 (ref 5–15)
Anion gap: 13 (ref 5–15)
Anion gap: 9 (ref 5–15)
BUN: 86 mg/dL — AB (ref 6–20)
BUN: 84 mg/dL — AB (ref 6–20)
BUN: 82 mg/dL — ABNORMAL HIGH (ref 6–20)
CALCIUM: 8.1 mg/dL — AB (ref 8.9–10.3)
CHLORIDE: 119 mmol/L — AB (ref 101–111)
CHLORIDE: 120 mmol/L — AB (ref 101–111)
CO2: 22 mmol/L (ref 22–32)
CO2: 21 mmol/L — ABNORMAL LOW (ref 22–32)
CO2: 23 mmol/L (ref 22–32)
CREATININE: 2.34 mg/dL — AB (ref 0.44–1.00)
Calcium: 8.4 mg/dL — ABNORMAL LOW (ref 8.9–10.3)
Calcium: 8.4 mg/dL — ABNORMAL LOW (ref 8.9–10.3)
Chloride: 120 mmol/L — ABNORMAL HIGH (ref 101–111)
Creatinine, Ser: 2.31 mg/dL — ABNORMAL HIGH (ref 0.44–1.00)
Creatinine, Ser: 2.29 mg/dL — ABNORMAL HIGH (ref 0.44–1.00)
GFR calc Af Amer: 25 mL/min — ABNORMAL LOW (ref >60)
GFR calc Af Amer: 25 mL/min — ABNORMAL LOW (ref >60)
GFR calc non Af Amer: 22 mL/min — ABNORMAL LOW (ref >60)
GFR, EST AFRICAN AMERICAN: 26 mL/min — AB (ref >60)
GFR, EST NON AFRICAN AMERICAN: 22 mL/min — AB (ref >60)
GFR, EST NON AFRICAN AMERICAN: 21 mL/min — AB (ref >60)
GLUCOSE: 124 mg/dL — AB (ref 65–99)
Glucose, Bld: 111 mg/dL — ABNORMAL HIGH (ref 65–99)
Glucose, Bld: 118 mg/dL — ABNORMAL HIGH (ref 65–99)
POTASSIUM: 3.4 mmol/L — AB (ref 3.5–5.1)
POTASSIUM: 3.4 mmol/L — AB (ref 3.5–5.1)
Potassium: 3.6 mmol/L (ref 3.5–5.1)
SODIUM: 152 mmol/L — AB (ref 135–145)
SODIUM: 153 mmol/L — AB (ref 135–145)
SODIUM: 152 mmol/L — AB (ref 135–145)

## 2015-10-09 LAB — CBC
HEMATOCRIT: 25.6 % — AB (ref 36.0–46.0)
HEMOGLOBIN: 8 g/dL — AB (ref 12.0–15.0)
MCH: 29.1 pg (ref 26.0–34.0)
MCHC: 31.3 g/dL (ref 30.0–36.0)
MCV: 93.1 fL (ref 78.0–100.0)
Platelets: 77 10*3/uL — ABNORMAL LOW (ref 150–400)
RBC: 2.75 MIL/uL — ABNORMAL LOW (ref 3.87–5.11)
RDW: 17.6 % — AB (ref 11.5–15.5)
WBC: 11.3 10*3/uL — AB (ref 4.0–10.5)

## 2015-10-09 LAB — GLUCOSE, CAPILLARY
GLUCOSE-CAPILLARY: 114 mg/dL — AB (ref 65–99)
GLUCOSE-CAPILLARY: 117 mg/dL — AB (ref 65–99)
GLUCOSE-CAPILLARY: 119 mg/dL — AB (ref 65–99)
GLUCOSE-CAPILLARY: 119 mg/dL — AB (ref 65–99)
GLUCOSE-CAPILLARY: 120 mg/dL — AB (ref 65–99)
Glucose-Capillary: 111 mg/dL — ABNORMAL HIGH (ref 65–99)

## 2015-10-09 MED ORDER — FENTANYL CITRATE (PF) 100 MCG/2ML IJ SOLN
12.5000 ug | Freq: Once | INTRAMUSCULAR | Status: AC
  Administered 2015-10-09: 12.5 ug via INTRAVENOUS

## 2015-10-09 MED ORDER — POTASSIUM CHLORIDE 20 MEQ/15ML (10%) PO SOLN
20.0000 meq | ORAL | Status: AC
  Administered 2015-10-09 (×2): 20 meq
  Filled 2015-10-09 (×3): qty 15

## 2015-10-09 MED ORDER — MAGNESIUM SULFATE 2 GM/50ML IV SOLN
2.0000 g | Freq: Once | INTRAVENOUS | Status: AC
  Administered 2015-10-10: 2 g via INTRAVENOUS
  Filled 2015-10-09: qty 50

## 2015-10-09 MED ORDER — DEXTROSE 5 % IV SOLN
INTRAVENOUS | Status: DC
  Administered 2015-10-09: 1000 mL via INTRAVENOUS
  Administered 2015-10-10: 21:00:00 via INTRAVENOUS

## 2015-10-09 MED ORDER — POTASSIUM CHLORIDE 20 MEQ/15ML (10%) PO SOLN
40.0000 meq | Freq: Once | ORAL | Status: AC
  Administered 2015-10-09: 40 meq
  Filled 2015-10-09: qty 30

## 2015-10-09 NOTE — Procedures (Signed)
Extubation Procedure Note  Patient Details:   Name: Megan Sims DOB: [birth year] MRN: [id number]   Airway Documentation:     Evaluation  O2 sats: stable throughout Complications: No apparent complications Patient did tolerate procedure well. Bilateral Breath Sounds: Diminished   Yes   Patient extubated to [place]. Vital signs stable throughout. No complications. Patient tolerating well at this time. RT will continue to monitor.  Mcneil Sober 10/09/2015, 1:04 PM

## 2015-10-09 NOTE — Progress Notes (Signed)
eLink Physician-Brief Progress Note Patient Name: Megan Sims DOB: 19-Oct-1955 MRN: [id number]   Date of Service  10/09/2015  HPI/Events of Note  Contacted by bedside nurse regarding previous tachycardia. Hypokalemia 3.4 replaced with enteric potassium chloride. Earlier magnesium 1.7 not replaced. Has central venous access. Creatinine 2.29 and improving as well as BUN. Nurse reports poor cough. Patient extubated today.   eICU Interventions  1. Continuing telemetry monitoring 2. Magnesium sulfate 2 g IV now 3. Repeat electrolytes in the morning 4. Continue close ICU monitoring given potential for reintubation      Intervention Category Intermediate Interventions: Arrhythmia - evaluation and management;Electrolyte abnormality - evaluation and management  Tera Partridge 10/09/2015, 11:50 PM

## 2015-10-09 NOTE — Progress Notes (Addendum)
PULMONARY / CRITICAL CARE MEDICINE   Name: Megan Sims MRN: [id number] DOB: October 09, 1955    ADMISSION DATE:  10/07/2015 CONSULTATION DATE:  10/02/2015  REFERRING MD:  [facility] Dr. Erlinda Hong  CHIEF COMPLAINT:  Unresponsiveness. Hypovolemic shock  HISTORY OF PRESENT ILLNESS:    Megan Sims is a 60 y.o. Sims with medical history significant for Lung cancer, not currently on treatment, history of dermatomyositis, CHF,and a recent admission for GI bleed due to duodenal ulcer on PPI (H pylori by US biopsy pending) from 9/5 through 09/30/2015,at which time, Megan Sims was diagnosed with HCAP, sent home with Levaquin, failing to take the medicine, now presenting with worsening respiratory symptoms.Megan Sims reports increasing shortness of breath, increased mucus production in the setting of chronic hemoptysis, as well as generalized weakness. The patient denies fevers, chills or diaphoresis. Megan Sims denies any headaches. Megan Sims reports chest pain with cough, but no cardiac complaints. Megan Sims denies any nausea vomiting or diarrhea. Megan Sims denies any dysuria or gross hematuria.  At the emergency room, Megan Sims was hemodynamically stable. Megan Sims hemoglobin was 8.4. Megan Sims was started on broad-spectrum antibiotics. Megan Sims received 2 L of IV fluids for possible sepsis. Megan Sims was transferred to the floors. Megan Sims had 2-3 large "dark colored" bowel movements overnight. This morning, the nurse walked in and patient was unresponsive. Megan Sims was intubated for airway protection. Transferred to the ICU. Megan Sims hemoglobin dropped to 3.5 from 8.4.   SUBJECTIVE:  Had 1 BM last 24 hrs > maroon colored/melena  Tagged NM bleeding was neg for active bleeding/Hgb is stable  Weaning well on 5/5 PS ,  Very alert following commands well .    VITAL SIGNS: BP (!) 149/72   Pulse (!) 101   Temp 98.4 F (36.9 C) (Oral)   Resp 18   Ht '5\' 1"'$  (1.549 m)   Wt 61.5 kg (135 lb 9.3 oz)   SpO2 99%   BMI 25.62 kg/m   HEMODYNAMICS:    VENTILATOR SETTINGS: Vent Mode: CPAP;PSV FiO2 (%):  [40 %] 40  % Set Rate:  [15 bmp] 15 bmp Vt Set:  [390 mL] 390 mL PEEP:  [5 cmH20] 5 cmH20 Plateau Pressure:  [13 cmH20-17 cmH20] 17 cmH20  INTAKE / OUTPUT: I/O last 3 completed shifts: In: 1130 [I.V.:260; Other:340; NG/GT:480; IV Piggyback:50] Out: 4742 [Urine:4185]  PHYSICAL EXAMINATION: General:  . Comfortable. NAD Neuro:   No lateralizing signs noted. Supple neck. Following commands , mae x 4  HEENT:  PERLA. (-) NVD.  ET tube in place. Cardiovascular:  Good s1/s2.  No S3, rub, gallop, murmur.  Lungs:  Decreased BS  Abdomen:  Dec BS. Soft abdomen. (-) masses/tenderness.  Musculoskeletal:  Gr 2 edema. Swelling in Megan Sims BUE. Warm extremities Skin:  (-) clubbing/cyanosi/rash  LABS:  BMET  Recent Labs Lab 10/07/15 1448 10/08/15 0600 10/08/15 1825 10/09/15 0459  NA 150* 150*  --  152*  K 3.6 2.8* 3.7 3.4*  CL 120* 119*  --  120*  CO2 19* 22  --  22  BUN 79* 85*  --  86*  CREATININE 2.36* 2.34*  --  2.31*  GLUCOSE 128* 133*  --  111*    Electrolytes  Recent Labs Lab 10/07/15 1448 10/07/15 1703 10/08/15 0600 10/08/15 1825 10/09/15 0459  CALCIUM 7.8*  --  7.7*  --  8.1*  MG  --  2.1 1.9 1.7  --   PHOS  --  5.5* 4.5 4.4  --     CBC  Recent Labs Lab 10/07/15 0354  10/08/15 0715  10/08/15 1826 10/08/15 2244 10/09/15 0459  WBC 9.2  --  8.2  --   --   --  11.3*  HGB 8.5*  < > 8.4*  8.5*  < > 8.7* 8.8* 8.0*  HCT 25.6*  < > 26.7*  26.7*  < > 27.4* 27.6* 25.6*  PLT 76*  --  70*  --   --   --  77*  < > = values in this interval not displayed.  Coag's  Recent Labs Lab 10/08/15 1358  INR 1.35    Sepsis Markers  Recent Labs Lab 10/02/15 1735  LATICACIDVEN 2.6*    ABG  Recent Labs Lab 10/02/15 1520 10/02/15 1700  PHART 7.506* 7.471*  PCO2ART 29.8* 32.1  PO2ART 317* 96.7    Liver Enzymes No results for input(s): AST, ALT, ALKPHOS, BILITOT, ALBUMIN in the last 168 hours.  Cardiac Enzymes  Recent Labs Lab 10/02/15 1736 10/03/15 0254  TROPONINI  0.36* 0.22*    Glucose  Recent Labs Lab 10/08/15 1203 10/08/15 1831 10/08/15 2006 10/08/15 2325 10/09/15 0410 10/09/15 0807  GLUCAP 120* 140* 102* 98 114* 111*    Imaging Nm Gi Blood Loss  Result Date: 10/08/2015 CLINICAL DATA:  Blood in stool. EXAM: NUCLEAR MEDICINE GASTROINTESTINAL BLEEDING SCAN TECHNIQUE: Sequential abdominal images were obtained following intravenous administration of Tc-74mlabeled red blood cells. RADIOPHARMACEUTICALS:  Twenty-five mCi Tc-933mn-vitro labeled red cells. COMPARISON:  None. FINDINGS: imaging was performed over the abdomen out to 2 hours. No evidence of radiotracer accumulation to suggest or localize active GI bleeding. IMPRESSION: No visible active GI bleed. Electronically Signed   By: KeRolm Baptise.D.   On: 10/08/2015 17:33     STUDIES:  Echo 9/9 > EF 65-70%, DD  CULTURES: Blood 9/9 > (-) Trache 9/9 > (-) MRSA 9/9 > (-)  ANTIBIOTICS: Vanc 9/8 > 9/11 Cefepime 9/8 - 9/15  SIGNIFICANT EVENTS: 9/8 admitted for possible HCAP.  9/9 intubated for respiratory failure. Transferred to ICU.  LINES/TUBES: L port R IJ 9/9 >   DISCUSSION: Megan Sims, history of lung cancer, recent admit for healthcare associated pneumonia and GI bleed secondary to duodenal ulcer, admitted for altered mental status, sepsis, possible healthcare associated pneumonia again. Intubated for respiratory failure on 9/9 and profoundly anemic likely from GI source.    ASSESSMENT / PLAN:  PULMONARY A: Acute hypoxemic respiratory failure secondary to unable to protect airway, healthcare associated pneumonia right upper lung and right middle lung, concern for aspiration pneumonia.  Pulm edema H/O Lung CA P:   Cont vent support.  Evaluate for extubation today  Neb Treatments. Keep cefepime. Plan for 7d > d/c 9/15 Cont diuresis Check cxr in am   CARDIOVASCULAR A:  Hypovolemic shock 2/2 GI bleed. Resolved.  Sinus tachycardia P:  tele  RENAL A:   AKI 2/2  Hypovolemia Metabolic acidosis. Lactic acidosis. Improved  Hypernatremia and hyperchloremia Hypokalemia  P:   Cont Lasix 20 mg daily  If not able to extubate may need to add free water  Replace K +   GASTROINTESTINAL A:   Active GI bleed. Megan Sims with DU but was noted NOT to be actively bleeding on 9/11. Megan Sims had a Dieulafoy lesion/visible vessel in duodenal bulb which was clipped on 9/11.  S/P EGD on 9/6, 9/11, 9/13 Tagged NM scan neg 9/16   P:   No transfusion last 48 hrs. Rpt EGD on 9/13 >> no bleeding.  Protonix BID Will d/c serial Hb and Hct , check cbc daily  Has received 9upRBC since 9/9, 1 pack platelet on 9/11. No transfusion since 9/12 Cont TF   HEMATOLOGIC A:   Severe anemia. Hemorrhagic and hypovolemic shock secondary to GI bleed. P:  Transfuse packed red blood cells to keep Hb > 7.  Daily Hb and Hct  INFECTIOUS A:   HCAP, R mid-lower. Possible aspiration pneumonia. P:   Cont cefepime. Plan for 7d > end 9/15   ENDOCRINE A:   No issues.  P:   cbg q 4 SSI Megan Sims takes pred 10 mg daily (?rheum d/o) > contHC  50 mg q 24 > switch to PO when eating  NEUROLOGIC A:   AMS / Encephalopathy P:   RASS goal: 0 - PRN fentanyl.    FAMILY  - needs Family meeting on 9/14. Megan Sims and family want Megan Sims full code.  - Updates: - Inter-disciplinary family meet or Palliative Care meeting due by:  Sept 18 Megan Sims and Megan Sims updated 9/16 at bedside   Critical care time with this patient today:    Tammy Parrett NP-C  [facility]  [phone]   10/09/2015, 10:36 AM   STAFF NOTE: I, Merrie Roof, MD FACP have personally reviewed patient's available data, including medical history, events of note, physical examination and test results as part of my evaluation. I have discussed with resident/NP and other care providers such as pharmacist, RN and RRT. In addition, I personally evaluated patient and elicited key findings of: no bleeding actively noted, no  BM today, follow hgb further, ppi important, pcxr has know area rt chronic now with likely some edema on top, follow pcxr further, weaning now cpap 5 ps5, goal 30 min , assess rsbi, consider extubation, K supp, Na noted, add d5w and check na in pm The patient is critically ill with multiple organ systems failure and requires high complexity decision making for assessment and support, frequent evaluation and titration of therapies, application of advanced monitoring technologies and extensive interpretation of multiple databases.   Critical Care Time devoted to patient care services described in this note is 30 Minutes. This time reflects time of care of this signee: Merrie Roof, MD FACP. This critical care time does not reflect procedure time, or teaching time or supervisory time of PA/NP/Med student/Med Resident etc but could involve care discussion time. Rest per NP/medical resident whose note is outlined above and that I agree with   Lavon Paganini. Titus Mould, MD, [place] Pgr: [facility] 10/09/2015 12:44 PM

## 2015-10-09 NOTE — Progress Notes (Signed)
eLink Physician-Brief Progress Note Patient Name: Megan Sims DOB: [birth year] MRN: [id number]   Date of Service  10/09/2015  HPI/Events of Note  Na unchanged  eICU Interventions  increae d5 to 100 Hypokalemia -repleted      Intervention Category Intermediate Interventions: Electrolyte abnormality - evaluation and management  Alesa Echevarria V. 10/09/2015, 10:12 PM

## 2015-10-09 NOTE — Progress Notes (Signed)
In review of the patient's chart this morning, her hgb is stable and she has not had any further bloody BMs since yesterday.  Her tagged NM bleeding scan was negative for active bleeding.  We are on standby at this time regarding an angio, especially given her renal injury.  Defer plans to GI at this time and follow.  Rozanna Cormany E

## 2015-10-09 NOTE — Progress Notes (Signed)
RT NT suctioned patient with no complications. Vital signs stable throughout. RN aware. Family at bedside. RT will continue to monitor.

## 2015-10-09 NOTE — Progress Notes (Signed)
Progress Note   Subjective  Patient passed a bloody stool yesterday. Tagged RBC scan negative for active bleeding. Hgb remained stable yesterday. Per nursing no bowel movements overnight.   Objective   Vital signs in last 24 hours: Temp:  [97.5 F (36.4 C)-98.2 F (36.8 C)] 97.5 F (36.4 C) (09/16 0407) Pulse Rate:  [96-121] 103 (09/16 0700) Resp:  [16-32] 32 (09/16 0700) BP: (89-151)/(50-72) 126/56 (09/16 0700) SpO2:  [92 %-100 %] 99 % (09/16 0700) FiO2 (%):  [40 %] 40 % (09/16 0400) Weight:  [135 lb 9.3 oz (61.5 kg)] 135 lb 9.3 oz (61.5 kg) (09/16 0444) Last BM Date: 10/08/15 General:    AA female in NAD, intubated, responding yes / no to questioning Heart:  Tachycardic, regular rhythm Lungs: Respirations even and unlabored, some coarse BS B Abdomen:  Soft, nontender and nondistended.  Extremities:  (+) 2 edema. Neurologic:  grossly normal neurologically. Psych:  Cooperative. .  Intake/Output from previous day: 09/15 0701 - 09/16 0700 In: 580 [I.V.:150; NG/GT:40; IV Piggyback:50] Out: 2335 [Urine:2335] Intake/Output this shift: No intake/output data recorded.  Lab Results:  Recent Labs  10/07/15 0354  10/08/15 0715  10/08/15 1826 10/08/15 2244 10/09/15 0459  WBC 9.2  --  8.2  --   --   --  11.3*  HGB 8.5*  < > 8.4*  8.5*  < > 8.7* 8.8* 8.0*  HCT 25.6*  < > 26.7*  26.7*  < > 27.4* 27.6* 25.6*  PLT 76*  --  70*  --   --   --  77*  < > = values in this interval not displayed. BMET  Recent Labs  10/07/15 1448 10/08/15 0600 10/08/15 1825 10/09/15 0459  NA 150* 150*  --  152*  K 3.6 2.8* 3.7 3.4*  CL 120* 119*  --  120*  CO2 19* 22  --  22  GLUCOSE 128* 133*  --  111*  BUN 79* 85*  --  86*  CREATININE 2.36* 2.34*  --  2.31*  CALCIUM 7.8* 7.7*  --  8.1*   LFT No results for input(s): PROT, ALBUMIN, AST, ALT, ALKPHOS, BILITOT, BILIDIR, IBILI in the last 72 hours. PT/INR  Recent Labs  10/08/15 1358  LABPROT 16.7*  INR 1.35     Studies/Results: Nm Gi Blood Loss  Result Date: 10/08/2015 CLINICAL DATA:  Blood in stool. EXAM: NUCLEAR MEDICINE GASTROINTESTINAL BLEEDING SCAN TECHNIQUE: Sequential abdominal images were obtained following intravenous administration of Tc-25mlabeled red blood cells. RADIOPHARMACEUTICALS:  Twenty-five mCi Tc-951mn-vitro labeled red cells. COMPARISON:  None. FINDINGS: imaging was performed over the abdomen out to 2 hours. No evidence of radiotracer accumulation to suggest or localize active GI bleeding. IMPRESSION: No visible active GI bleed. Electronically Signed   By: KeRolm Baptise.D.   On: 10/08/2015 17:33   Dg Chest Port 1 View  Result Date: 10/08/2015 CLINICAL DATA:  Respiratory failure, lung malignancy. EXAM: PORTABLE CHEST 1 VIEW COMPARISON:  Chest x-ray of October 05, 2015 FINDINGS: The lungs are well-expanded. Increased conspicuity of the right upper lobe infiltrate is noted. There are coarse infrahilar lung markings in the right lower lobe. The retrocardiac region on the left remains dense. The hemidiaphragm remains obscured. The endotracheal tube tip lies 5.1 cm above the carina. The right internal jugular venous catheter tip projects over the midportion of the SVC. The power port catheter tip projects over the proximal SVC. There is calcification in the wall of the aortic arch.  IMPRESSION: Interval increased conspicuity of the right perihilar and upper lobe infiltrate. Infiltrate in the right infrahilar region and in the left lower lobe is more conspicuous. There is a small left pleural effusion. Electronically Signed   By: David  Martinique M.D.   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 She has undergone 2 EGDs this week for recurrent bleeding, details as outlined in my  previous notes. She had no evidence of bleeding nor high risk stigmata for bleeding on most recent endoscopy on 9/13.  Yesterday she passed a large maroon stool. Tagged RBC scan was obtained to clarify location of bleeding and ensure no further distal tract bleeding. The scan was negative for active bleeding, her Hgb has remained relatively stable. No further bleeding since yesterday. It's possible what she passed yesterday was old blood.   While she clearly had bleeding from the dieulafoy earlier this week, unclear if her episodes since then have been from that site, the ulcer, or further distal in the bowel. We considered a CT angio yesterday but given her renal injury, we have avoided exposing her to contrast. On review of her EGDs historically, it appears she has had this duodenal ulcer since June and has been slow to heal, but it does not appear malignant. If she has evidence of rebleeding moving forward we can consider another EGD vs. repeat bleeding scan. Putting her through a colonoscopy at this time to clear her lower tract would be very difficult for her, but can consider it pending her course, if she can tolerate it. Given the persistent duodenal ulcer, cross sectional imaging would also be useful to further assess this area, but are avoiding IV contrast at this time until renal injury improves. She appears stable this morning.   Recommend at this time: - continue IV protonix '40mg'$  BID - please minimize blood draws if no evidence of active bleeding, Hgb once daily - resume tube feeds   Please call with any questions or changes in her status concerning for active bleeding.  St. Ignace Cellar, MD [facility] Pager [phone]

## 2015-10-09 NOTE — Progress Notes (Signed)
RT NT suctioned patient without any complications. Vital signs stable throughout. RN and family at bedside. RT will continue to monitor.

## 2015-10-10 ENCOUNTER — Inpatient Hospital Stay (HOSPITAL_COMMUNITY): Payer: Medicaid Other

## 2015-10-10 DIAGNOSIS — J9601 Acute respiratory failure with hypoxia: Secondary | ICD-10-CM

## 2015-10-10 DIAGNOSIS — J9600 Acute respiratory failure, unspecified whether with hypoxia or hypercapnia: Secondary | ICD-10-CM

## 2015-10-10 LAB — POCT I-STAT 3, ART BLOOD GAS (G3+)
Acid-base deficit: 1 mmol/L (ref 0.0–2.0)
Bicarbonate: 22.6 mmol/L (ref 20.0–28.0)
O2 SAT: 97 %
PCO2 ART: 28.3 mmHg — AB (ref 32.0–48.0)
PH ART: 7.506 — AB (ref 7.350–7.450)
TCO2: 23 mmol/L (ref 0–100)
pO2, Arterial: 79 mmHg — ABNORMAL LOW (ref 83.0–108.0)

## 2015-10-10 LAB — GLUCOSE, CAPILLARY
GLUCOSE-CAPILLARY: 136 mg/dL — AB (ref 65–99)
GLUCOSE-CAPILLARY: 189 mg/dL — AB (ref 65–99)
GLUCOSE-CAPILLARY: 153 mg/dL — AB (ref 65–99)
Glucose-Capillary: 134 mg/dL — ABNORMAL HIGH (ref 65–99)
Glucose-Capillary: 182 mg/dL — ABNORMAL HIGH (ref 65–99)

## 2015-10-10 LAB — BASIC METABOLIC PANEL
Anion gap: 11 (ref 5–15)
BUN: 78 mg/dL — AB (ref 6–20)
CALCIUM: 8.6 mg/dL — AB (ref 8.9–10.3)
CO2: 22 mmol/L (ref 22–32)
Chloride: 118 mmol/L — ABNORMAL HIGH (ref 101–111)
Creatinine, Ser: 2.22 mg/dL — ABNORMAL HIGH (ref 0.44–1.00)
GFR calc Af Amer: 27 mL/min — ABNORMAL LOW (ref >60)
GFR, EST NON AFRICAN AMERICAN: 23 mL/min — AB (ref >60)
GLUCOSE: 150 mg/dL — AB (ref 65–99)
POTASSIUM: 3.9 mmol/L (ref 3.5–5.1)
Sodium: 151 mmol/L — ABNORMAL HIGH (ref 135–145)

## 2015-10-10 LAB — PROTIME-INR
INR: 1.23
Prothrombin Time: 15.6 s — ABNORMAL HIGH (ref 11.4–15.2)

## 2015-10-10 LAB — CK: CK TOTAL: 319 U/L — AB (ref 38–234)

## 2015-10-10 LAB — CBC
HCT: 29.3 % — ABNORMAL LOW (ref 36.0–46.0)
Hemoglobin: 9.1 g/dL — ABNORMAL LOW (ref 12.0–15.0)
MCH: 29.1 pg (ref 26.0–34.0)
MCHC: 31.1 g/dL (ref 30.0–36.0)
MCV: 93.6 fL (ref 78.0–100.0)
PLATELETS: 86 10*3/uL — AB (ref 150–400)
RBC: 3.13 MIL/uL — AB (ref 3.87–5.11)
RDW: 18 % — AB (ref 11.5–15.5)
WBC: 15.5 10*3/uL — ABNORMAL HIGH (ref 4.0–10.5)

## 2015-10-10 LAB — LACTATE DEHYDROGENASE: LDH: 401 U/L — ABNORMAL HIGH (ref 98–192)

## 2015-10-10 LAB — MAGNESIUM: Magnesium: 2.3 mg/dL (ref 1.7–2.4)

## 2015-10-10 LAB — C-REACTIVE PROTEIN: CRP: 21.5 mg/dL — ABNORMAL HIGH (ref <1.0)

## 2015-10-10 LAB — SEDIMENTATION RATE: Sed Rate: 73 mm/h — ABNORMAL HIGH (ref 0–22)

## 2015-10-10 MED ORDER — FREE WATER
100.0000 mL | Freq: Three times a day (TID) | Status: DC
  Administered 2015-10-10 – 2015-10-11 (×3): 100 mL

## 2015-10-10 MED ORDER — SODIUM CHLORIDE 0.9 % IV SOLN
50.0000 ug/h | INTRAVENOUS | Status: DC
  Administered 2015-10-10: 25 ug/h via INTRAVENOUS
  Administered 2015-10-12: 50 ug/h via INTRAVENOUS
  Filled 2015-10-10 (×2): qty 50

## 2015-10-10 MED ORDER — FENTANYL CITRATE (PF) 100 MCG/2ML IJ SOLN
INTRAMUSCULAR | Status: AC
  Filled 2015-10-10: qty 2

## 2015-10-10 MED ORDER — VANCOMYCIN HCL IN DEXTROSE 1-5 GM/200ML-% IV SOLN
1000.0000 mg | Freq: Once | INTRAVENOUS | Status: AC
  Administered 2015-10-10: 1000 mg via INTRAVENOUS
  Filled 2015-10-10: qty 200

## 2015-10-10 MED ORDER — ETOMIDATE 2 MG/ML IV SOLN
10.0000 mg | Freq: Once | INTRAVENOUS | Status: AC
  Administered 2015-10-10: 10 mg via INTRAVENOUS

## 2015-10-10 MED ORDER — FENTANYL CITRATE (PF) 100 MCG/2ML IJ SOLN
100.0000 ug | Freq: Once | INTRAMUSCULAR | Status: AC
  Administered 2015-10-10: 100 ug via INTRAVENOUS

## 2015-10-10 MED ORDER — VANCOMYCIN HCL IN DEXTROSE 750-5 MG/150ML-% IV SOLN
750.0000 mg | INTRAVENOUS | Status: DC
  Administered 2015-10-11: 750 mg via INTRAVENOUS
  Filled 2015-10-10: qty 150

## 2015-10-10 MED ORDER — METHYLPREDNISOLONE SODIUM SUCC 40 MG IJ SOLR
40.0000 mg | Freq: Two times a day (BID) | INTRAMUSCULAR | Status: DC
  Administered 2015-10-10 – 2015-10-12 (×4): 40 mg via INTRAVENOUS
  Filled 2015-10-10 (×4): qty 1

## 2015-10-10 MED ORDER — HYDRALAZINE HCL 20 MG/ML IJ SOLN
10.0000 mg | INTRAMUSCULAR | Status: DC | PRN
  Administered 2015-10-10 (×2): 10 mg via INTRAVENOUS
  Filled 2015-10-10 (×2): qty 1

## 2015-10-10 MED ORDER — CHLORHEXIDINE GLUCONATE 0.12% ORAL RINSE (MEDLINE KIT)
15.0000 mL | Freq: Two times a day (BID) | OROMUCOSAL | Status: DC
  Administered 2015-10-10 – 2015-10-12 (×5): 15 mL via OROMUCOSAL

## 2015-10-10 MED ORDER — HALOPERIDOL LACTATE 5 MG/ML IJ SOLN
2.0000 mg | Freq: Once | INTRAMUSCULAR | Status: AC
  Administered 2015-10-10: 2 mg via INTRAVENOUS
  Filled 2015-10-10: qty 1

## 2015-10-10 MED ORDER — SODIUM CHLORIDE 0.9 % IV SOLN
500.0000 mg | Freq: Two times a day (BID) | INTRAVENOUS | Status: DC
  Administered 2015-10-10 (×2): 500 mg via INTRAVENOUS
  Filled 2015-10-10 (×3): qty 0.5

## 2015-10-10 MED ORDER — MIDAZOLAM HCL 2 MG/2ML IJ SOLN
INTRAMUSCULAR | Status: AC
  Filled 2015-10-10: qty 2

## 2015-10-10 MED ORDER — ORAL CARE MOUTH RINSE: 15.0000 mL | OROMUCOSAL | Status: DC

## 2015-10-10 MED ORDER — MIDAZOLAM HCL 2 MG/2ML IJ SOLN
2.0000 mg | Freq: Once | INTRAMUSCULAR | Status: AC
  Administered 2015-10-10: 2 mg via INTRAVENOUS

## 2015-10-10 MED ORDER — ORAL CARE MOUTH RINSE
15.0000 mL | Freq: Two times a day (BID) | OROMUCOSAL | Status: DC
  Administered 2015-10-10 (×2): 15 mL via OROMUCOSAL

## 2015-10-10 MED ORDER — CHLORHEXIDINE GLUCONATE 0.12 % MT SOLN
15.0000 mL | Freq: Two times a day (BID) | OROMUCOSAL | Status: DC
  Administered 2015-10-11: 15 mL via OROMUCOSAL

## 2015-10-10 NOTE — Progress Notes (Signed)
RT called to room to NTS patient.  RT  Advanced catheter down the right nare on two occasions with moderate tan/pink tinged secretions.  Pt placed back on 6L [place].

## 2015-10-10 NOTE — Progress Notes (Addendum)
PULMONARY / CRITICAL CARE MEDICINE   Name: Megan Sims MRN: [id number] DOB: 04/13/1955    ADMISSION DATE:  10/02/2015 CONSULTATION DATE:  10/02/2015  REFERRING MD:  [facility] Dr. Erlinda Hong  CHIEF COMPLAINT:  Unresponsiveness. Hypovolemic shock  HISTORY OF PRESENT ILLNESS:    Pt is a 60 y.o. female with medical history significant for Lung cancer, not currently on treatment, history of dermatomyositis, CHF,and a recent admission for GI bleed due to duodenal ulcer on PPI (H pylori by US biopsy pending) from 9/5 through 09/30/2015,at which time, she was diagnosed with HCAP, sent home with Levaquin, failing to take the medicine, now presenting with worsening respiratory symptoms.she reports increasing shortness of breath, increased mucus production in the setting of chronic hemoptysis, as well as generalized weakness. The patient denies fevers, chills or diaphoresis. She denies any headaches. She reports chest pain with cough, but no cardiac complaints. She denies any nausea vomiting or diarrhea. She denies any dysuria or gross hematuria.  At the emergency room, she was hemodynamically stable. Her hemoglobin was 8.4. She was started on broad-spectrum antibiotics. She received 2 L of IV fluids for possible sepsis. She was transferred to the floors. She had 2-3 large "dark colored" bowel movements overnight. This morning, the nurse walked in and patient was unresponsive. She was intubated for airway protection. Transferred to the ICU. Her hemoglobin dropped to 3.5 from 8.4.   SUBJECTIVE:  No further bleeding overnight, hbg is stable  Extubated yestereday 9/16 . Increased wob/RR and trouble managing thick secretions.  Very alert following commands well increased wob .  Voice /cough weak  UE swelling persists ? Chronic lymphedema    VITAL SIGNS: BP (!) 152/64   Pulse (!) 109   Temp 97.6 F (36.4 C) (Oral)   Resp (!) 23   Ht 5' 1"  (1.549 m)   Wt 59.3 kg (130 lb 11.7 oz)   SpO2 95%   BMI 24.70 kg/m    HEMODYNAMICS:    VENTILATOR SETTINGS: Vent Mode: CPAP;PSV FiO2 (%):  [40 %] 40 % PEEP:  [5 cmH20] 5 cmH20 Pressure Support:  [5 cmH20] 5 cmH20  INTAKE / OUTPUT: I/O last 3 completed shifts: In: 1739.6 [I.V.:1509.6; Other:180; IV Piggyback:50] Out: 3310 [Urine:3310]  PHYSICAL EXAMINATION: General:  Increased wob, mild distress  Neuro:   No lateralizing signs noted. Supple neck. Following commands , mae x 4  HEENT:  PERLA. (-) NVD.  Dry mucosa  Cardiovascular:  ST  No S3, rub, gallop, murmur.  Lungs:  Coarse rhonchi , copious secretions  Abdomen:  Dec BS. Soft abdomen. (-) masses/tenderness.  Musculoskeletal:  Gr 2 edema. Swelling in her BUE. Warm extremities Bone deformity along left tibia (chronic )  Skin:  (-) clubbing/cyanosis, leathery skin   LABS:  BMET  Recent Labs Lab 10/09/15 1330 10/09/15 2000 10/10/15 0331  NA 153* 152* 151*  K 3.6 3.4* 3.9  CL 119* 120* 118*  CO2 21* 23 22  BUN 84* 82* 78*  CREATININE 2.34* 2.29* 2.22*  GLUCOSE 124* 118* 150*    Electrolytes  Recent Labs Lab 10/07/15 1703 10/08/15 0600 10/08/15 1825  10/09/15 1330 10/09/15 2000 10/10/15 0331  CALCIUM  --  7.7*  --   < > 8.4* 8.4* 8.6*  MG 2.1 1.9 1.7  --   --   --  2.3  PHOS 5.5* 4.5 4.4  --   --   --   --   < > = values in this interval not displayed.  CBC  Recent Labs Lab 10/08/15 0715  10/08/15 2244 10/09/15 0459 10/10/15 0331  WBC 8.2  --   --  11.3* 15.5*  HGB 8.4*  8.5*  < > 8.8* 8.0* 9.1*  HCT 26.7*  26.7*  < > 27.6* 25.6* 29.3*  PLT 70*  --   --  77* 86*  < > = values in this interval not displayed.  Coag's  Recent Labs Lab 10/08/15 1358  INR 1.35    Sepsis Markers No results for input(s): LATICACIDVEN, PROCALCITON, O2SATVEN in the last 168 hours.  ABG No results for input(s): PHART, PCO2ART, PO2ART in the last 168 hours.  Liver Enzymes No results for input(s): AST, ALT, ALKPHOS, BILITOT, ALBUMIN in the last 168 hours.  Cardiac  Enzymes No results for input(s): TROPONINI, PROBNP in the last 168 hours.  Glucose  Recent Labs Lab 10/09/15 1240 10/09/15 1554 10/09/15 1944 10/09/15 2333 10/10/15 0320 10/10/15 0751  GLUCAP 117* 120* 119* 119* 134* 136*    Imaging Dg Chest Port 1 View  Result Date: 10/10/2015 CLINICAL DATA:  Pneumonia. EXAM: PORTABLE CHEST 1 VIEW COMPARISON:  10/08/2015. FINDINGS: Further consolidation in the RIGHT upper lobe representing pneumonia. Cardiomegaly stable. No pneumothorax. Port-A-Cath unchanged. Central venous catheter tip unchanged. Endotracheal tube has been removed. LEFT lower lobe atelectasis is also worse. IMPRESSION: Worsening aeration. Electronically Signed   By: Staci Righter M.D.   On: 10/10/2015 07:18     STUDIES:  Echo 9/9 > EF 65-70%, DD 9/15 NM GI Bleeding >neg   CULTURES: Blood 9/9 > (-) Trache 9/9 > (-) MRSA 9/9 > (-) 9/17 Sputum >>  ANTIBIOTICS: Vanc 9/8 > 9/11 Cefepime 9/8 - 9/15 Vanc 9/17 >. Merrem  9/17>>  SIGNIFICANT EVENTS: 9/8 admitted for possible HCAP.  9/9 intubated for respiratory failure. Transferred to ICU. 9/16 extubated  9/17 reintubated for increased wob/secretions  LINES/TUBES: L port R IJ 9/9 >  ETT 9/9 >9/16 >reintubated 9/17   DISCUSSION: 61 female, history of lung cancer, recent admit for healthcare associated pneumonia and GI bleed secondary to duodenal ulcer, admitted for altered mental status, sepsis, possible healthcare associated pneumonia again. Intubated for respiratory failure on 9/9 and profoundly anemic likely from GI source.  Extubated 9/16 , unable to manage secretions w/ increased wob 9/17 , reintubated.    ASSESSMENT / PLAN:  PULMONARY A: Acute hypoxemic respiratory failure secondary to unable to protect airway, healthcare associated pneumonia right upper lung and right middle lung, concern for aspiration pneumonia.  Pulm edema H/O Lung CA stage 3 (08/2014)  ?Neuromuscular component  9/17 >reintubated for  increased wob, CXR w/increased right PNA  P:   Intubated , full vent support.  VAP protocol  Evaluate for daily wean/SBT  Cont neb  Treatments. Add IV ABX  Check cxr .  Check ABG in 1 hr   CARDIOVASCULAR A:  Hypovolemic shock 2/2 GI bleed. Resolved.  Sinus tachycardia Echo 9/10 EF 65-70, gr 1 DD .  P:  Tele   RENAL A:   AKI 2/2 Hypovolemia Metabolic acidosis. Lactic acidosis. Improved  Hypernatremia and hyperchloremia Hypokalemia resolved  Hx of Pagets dz  P:   Cont Lasix 20 mg daily  Cont IV D5 until Na tr down  Add free water .    GASTROINTESTINAL A:   Active GI bleed. Pt with DU but was noted NOT to be actively bleeding on 9/11. 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  S/P EGD on 9/6, 9/11, 9/13 Tagged  NM scan neg 9/16  Dysphagia on Chronic Peg home   P:   Rpt EGD on 9/13 >> no bleeding. , tagged NM scan neg  Protonix BID check cbc daily  Has received 9upRBC since 9/9, 1 pack platelet on 9/11. No transfusion since 9/12 Restart TF Add free water 100 every every 8 h  Cont IV D5 until Na tr down    HEMATOLOGIC A:   Severe anemia. Hemorrhagic and hypovolemic shock secondary to GI bleed. P:  Transfuse packed red blood cells to keep Hb > 7.  Daily Hb and Hct  INFECTIOUS A:   HCAP, R mid-lower. Possible aspiration pneumonia. P:   Cefepime  7d > end 9/15 Begin Vanc and Merrem (better in renal failure )9/17 day 1     ENDOCRINE A:   Hyperglycemia on steroids  Chronic steroids ? Dermatomyositis  P:   cbg q 4 SSI Pt takes pred 10 mg daily (?rheum d/o) > contHC  50 mg q 24 > switch to PO when eating  NEUROLOGIC A:   AMS / Encephalopathy Undergoing workup for muscle weakness/dermatomyositis at [facility] (care everywhere )  P:   RASS goal: 0 -1 PAD protocol w/ fent drip and prn versed.    FAMILY  - needs Family meeting on 9/14. Pt and family want her full code.  - Updates: -  Inter-disciplinary family meet or Palliative Care meeting due by:  Sept 18 Husband updated 9/17      Tammy Parrett NP-C  [facility]  [phone]   10/10/2015, 9:34 AM   STAFF NOTE: I, Merrie Roof, MD FACP have personally reviewed patient's available data, including medical history, events of note, physical examination and test results as part of my evaluation. I have discussed with resident/NP and other care providers such as pharmacist, RN and RRT. In addition, I personally evaluated patient and elicited key findings of: weak appearing, notable severe ronchi in neck, rattling upper airway, RR 45, distress, reintubtate, 8 cc/kg get abg, pcxr, she has muscle weakness from dermatomyositis? Vs other auto immune dz, will send autoimmune work  Up, consider steroids, needs trach, will consent pt and husband, follow cbc, no bleeding present, increase free water, follow crt trend I tried to order Myositis specific ab, not able I sent: esr, ana, dsdna, c3, c4, crp, rf, cpk, aldolase, ldh Would favor muscle BX if has not been done!!! Get NIF, vc  The patient is critically ill with multiple organ systems failure and requires high complexity decision making for assessment and support, frequent evaluation and titration of therapies, application of advanced monitoring technologies and extensive interpretation of multiple databases.   Critical Care Time devoted to patient care services described in this note is 35 Minutes. This time reflects time of care of this signee: Merrie Roof, MD FACP. This critical care time does not reflect procedure time, or teaching time or supervisory time of PA/NP/Med student/Med Resident etc but could involve care discussion time. Rest per NP/medical resident whose note is outlined above and that I agree with   Lavon Paganini. Titus Mould, MD, [place] Pgr: [facility] 10/10/2015 2:01 PM

## 2015-10-10 NOTE — Procedures (Signed)
Intubation Procedure Note Megan Sims [id number] [birth year]  Procedure: Intubation Indications: Respiratory insufficiency  Procedure Details Consent: Risks of procedure as well as the alternatives and risks of each were explained to the (patient/caregiver).  Consent for procedure obtained. Time Out: Verified patient identification, verified procedure, site/side was marked, verified correct patient position, special equipment/implants available, medications/allergies/relevent history reviewed, required imaging and test results available.  Performed  Maximum sterile technique was used including gloves, gown and hand hygiene.  MAC and 3    Evaluation Hemodynamic Status: BP stable throughout; O2 sats: stable throughout Patient's Current Condition: stable Complications: No apparent complications Patient did tolerate procedure well. Chest X-ray ordered to verify placement.  CXR: pending.   Raylene Miyamoto 10/10/2015  Frothy secretions suctioned all over cords

## 2015-10-10 NOTE — Progress Notes (Signed)
RT called to NTS patient. RT obtained copious tan secretions from the right nare. Patient placed back on 6lnc. Vital signs stable at this time. RT will continue to monitor.

## 2015-10-10 NOTE — Progress Notes (Signed)
Progress Note   Subjective  Patient was extubated yesterday, but unfortunately was reintubated this morning. She passed a stool with some bloody sediment overnight but Hgb has remained stable. Tagged RBC scan on 9/15 was negative. She denies any abdominal pains.    Objective   Vital signs in last 24 hours: Temp:  [97.6 F (36.4 C)-98.7 F (37.1 C)] 97.6 F (36.4 C) (09/17 0800) Pulse Rate:  [93-118] 109 (09/17 0932) Resp:  [17-36] 23 (09/17 0932) BP: (129-194)/(58-100) 152/64 (09/17 0932) SpO2:  [90 %-100 %] 95 % (09/17 0932) FiO2 (%):  [40 %] 40 % (09/17 0932) Weight:  [130 lb 11.7 oz (59.3 kg)] 130 lb 11.7 oz (59.3 kg) (09/17 0353) Last BM Date: 10/09/15 General:    AA female (examined prior to intubation) with labored respirations Heart:  Tachycardic, regular Lungs: Respirations labored, course BS B Abdomen:  Soft, nontender and nondistended. Extremities:  (+) 2 edema. Neurologic:  Alert and oriented,  grossly normal neurologically. Psych:  Cooperative. Normal mood and affect.  Intake/Output from previous day: 09/16 0701 - 09/17 0700 In: 1529.6 [I.V.:1389.6; IV Piggyback:50] Out: 2310 [Urine:2310] Intake/Output this shift: No intake/output data recorded.  Lab Results:  Recent Labs  10/08/15 0715  10/08/15 2244 10/09/15 0459 10/10/15 0331  WBC 8.2  --   --  11.3* 15.5*  HGB 8.4*  8.5*  < > 8.8* 8.0* 9.1*  HCT 26.7*  26.7*  < > 27.6* 25.6* 29.3*  PLT 70*  --   --  77* 86*  < > = values in this interval not displayed. BMET  Recent Labs  10/09/15 1330 10/09/15 2000 10/10/15 0331  NA 153* 152* 151*  K 3.6 3.4* 3.9  CL 119* 120* 118*  CO2 21* 23 22  GLUCOSE 124* 118* 150*  BUN 84* 82* 78*  CREATININE 2.34* 2.29* 2.22*  CALCIUM 8.4* 8.4* 8.6*   LFT No results for input(s): PROT, ALBUMIN, AST, ALT, ALKPHOS, BILITOT, BILIDIR, IBILI in the last 72 hours. PT/INR  Recent Labs  10/08/15 1358  LABPROT 16.7*  INR 1.35    Studies/Results: Nm Gi  Blood Loss  Result Date: 10/08/2015 CLINICAL DATA:  Blood in stool. EXAM: NUCLEAR MEDICINE GASTROINTESTINAL BLEEDING SCAN TECHNIQUE: Sequential abdominal images were obtained following intravenous administration of Tc-72mlabeled red blood cells. RADIOPHARMACEUTICALS:  Twenty-five mCi Tc-967mn-vitro labeled red cells. COMPARISON:  None. FINDINGS: imaging was performed over the abdomen out to 2 hours. No evidence of radiotracer accumulation to suggest or localize active GI bleeding. IMPRESSION: No visible active GI bleed. Electronically Signed   By: KeRolm Baptise.D.   On: 10/08/2015 17:33   Dg Chest Port 1 View  Result Date: 10/10/2015 CLINICAL DATA:  Pneumonia. EXAM: PORTABLE CHEST 1 VIEW COMPARISON:  10/08/2015. FINDINGS: Further consolidation in the RIGHT upper lobe representing pneumonia. Cardiomegaly stable. No pneumothorax. Port-A-Cath unchanged. Central venous catheter tip unchanged. Endotracheal tube has been removed. LEFT lower lobe atelectasis is also worse. IMPRESSION: Worsening aeration. Electronically Signed   By: JoStaci Righter.D.   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 She has undergone 2 EGDs this week for recurrent bleeding, details as outlined in my previous notes. She had no evidence of bleeding nor high risk stigmata for bleeding on most recent endoscopy  on 9/13.  She had some suspected recurrent bleeding on 9/15, tagged RBC scan was obtained to clarify location of bleeding and ensure no further distal tract bleeding, and was negative. Her Hgb over the past few days has remained stable.   While she clearly had bleeding from the dieulafoy earlier this week, unclear of her mild bleeding episodes since then have been from that site, the ulcer, or further distal in the  bowel. On review of her EGDs historically, it appears she has had this duodenal ulcer since June and has been slow to heal, but it does not appear malignant. If she has evidence of rebleeding moving forward we can consider another EGD vs. repeat bleeding scan. Putting her through a colonoscopy at this time to clear her lower tract would be very difficult for her with her respiratory failure, but can consider it pending her course. Given the persistent duodenal ulcer, cross sectional imaging is recommended to further assess this area to ensure no malignant changes, but are avoiding IV contrast at this time until renal injury improves.   Recommend at this time: - continue IV protonix '40mg'$  BID - please minimize blood draws if no evidence of active bleeding, Hgb once daily - resume tube feeds   GI will follow peripherally for now if she has no active bleeding. Please call with any questions or changes in her status concerning for active bleeding. Dr. Loletha Carrow to assume [place] [facility] service tomorrow.   Hendry Cellar, MD [facility] Pager [phone]

## 2015-10-10 NOTE — Progress Notes (Signed)
eLink Physician-Brief Progress Note Patient Name: MARKEA RUZICH DOB: [birth year] MRN: [id number]   Date of Service  10/10/2015  HPI/Events of Note  Contacted by nurse with ongoing tachypnea. Patient continuing to have some difficulty expectorating her secretions. Staff have not yet tried to nasotracheally suction the patient with her hypertension. Currently having a bowel movement and reportedly has blood mixed in with stool. Nurse denies frank hematochezia.   eICU Interventions  1. Ordering hydralazine for hypertension confirmed with manual blood pressure cuff 2. Continuing to monitor hemoglobin/hematocrit with a.m. labs 3. Nurse to attempt to nasotracheally suctioning the patient once she comes off of bedpan 4. Plan to position patient upright what she has completed her bowel movement      Intervention Category Intermediate Interventions: Other:  Tera Partridge 10/10/2015, 2:33 AM

## 2015-10-10 NOTE — Progress Notes (Signed)
eLink Physician-Brief Progress Note Patient Name: Megan Sims DOB: 09-Sep-1955 MRN: [id number]   Date of Service  10/10/2015  HPI/Events of Note  Bedside nurse reports patient is somewhat anxious and intermittently pulling at leads. Patient has received part of her dose of IV magnesium sulfate.   eICU Interventions  Haldol 2 mg IV once after magnesium is complete      Intervention Category Intermediate Interventions: Other:  Tera Partridge 10/10/2015, 12:30 AM

## 2015-10-10 NOTE — Progress Notes (Signed)
Pharmacy Antibiotic Note  Megan Sims is a 60 y.o. female admitted on 09/29/2015 with pneumonia.  Pt is well known to rx for previou abx dosing with vanc/cefepime. Cefepime was completed on 9/15. Pt was extubated yesterday but require re-intubation this AM. Broad spectrum has been re-ordered again for PNA. Due to her renal issue, we will use merrem instead of imipenem.   Plan:  Vanc 1g x1 then '750mg'$  q24 Merrem '500mg'$  IV q12 F/u with trough as needed  Height: '5\' 1"'$  (154.9 cm) Weight: 130 lb 11.7 oz (59.3 kg) IBW/kg (Calculated) : 47.8  Temp (24hrs), Avg:98 F (36.7 C), Min:97.6 F (36.4 C), Max:98.7 F (37.1 C)   Recent Labs Lab 10/20/2015 0435 10/07/15 0354  10/08/15 0600 10/08/15 0715 10/09/15 0459 10/09/15 1330 10/09/15 2000 10/10/15 0331  WBC 14.0* 9.2  --   --  8.2 11.3*  --   --  15.5*  CREATININE 2.06* 2.34*  < > 2.34*  --  2.31* 2.34* 2.29* 2.22*  < > = values in this interval not displayed.  Estimated Creatinine Clearance: 22.3 mL/min (by C-G formula based on SCr of 2.22 mg/dL (H)).    Allergies  Allergen Reactions  . Hydrocodone-Acetaminophen Swelling    Increases secretions and sensation of throat swelling  . Demeclocycline Other (See Comments)    PILLESOPHAGITIS  . Tetracyclines & Related     PILLESOPHAGITIS  . Tramadol     SWELLING    Cefepime 9/8>>9/15 Vancomycin 9/8>>9/11  9/8 Urine: suggest recollection. 9/8 Blood x 2: NGTD  9/9 Resp: Few gram variable rods, rare gram positive cocci in pairs, consistent with normal respiratory flora. 9/17 BAL>>  Onnie Boer, PharmD Pager: [phone] 10/10/2015 10:12 AM

## 2015-10-10 NOTE — Progress Notes (Signed)
NIF & VC performed and patient 369m for VC and NIF -24.  Patient effort was minimum for VC.

## 2015-10-10 NOTE — Progress Notes (Signed)
Patient ID: Megan Sims, female   DOB: Apr 20, 1955, 60 y.o.   MRN: [id number] No further bleeding noted.  Please reconsult IR if needed.  Raenah Murley E

## 2015-10-11 ENCOUNTER — Inpatient Hospital Stay (HOSPITAL_COMMUNITY): Payer: Medicaid Other

## 2015-10-11 ENCOUNTER — Encounter (HOSPITAL_COMMUNITY): Payer: Medicaid Other

## 2015-10-11 DIAGNOSIS — C3491 Malignant neoplasm of unspecified part of right bronchus or lung: Secondary | ICD-10-CM

## 2015-10-11 DIAGNOSIS — C3492 Malignant neoplasm of unspecified part of left bronchus or lung: Secondary | ICD-10-CM

## 2015-10-11 DIAGNOSIS — D539 Nutritional anemia, unspecified: Secondary | ICD-10-CM

## 2015-10-11 LAB — BASIC METABOLIC PANEL
ANION GAP: 8 (ref 5–15)
Anion gap: 9 (ref 5–15)
BUN: 74 mg/dL — ABNORMAL HIGH (ref 6–20)
BUN: 73 mg/dL — AB (ref 6–20)
CALCIUM: 8.2 mg/dL — AB (ref 8.9–10.3)
CALCIUM: 8.1 mg/dL — AB (ref 8.9–10.3)
CO2: 23 mmol/L (ref 22–32)
CO2: 22 mmol/L (ref 22–32)
CREATININE: 2.08 mg/dL — AB (ref 0.44–1.00)
Chloride: 111 mmol/L (ref 101–111)
Chloride: 110 mmol/L (ref 101–111)
Creatinine, Ser: 2.04 mg/dL — ABNORMAL HIGH (ref 0.44–1.00)
GFR calc Af Amer: 29 mL/min — ABNORMAL LOW (ref >60)
GFR, EST AFRICAN AMERICAN: 29 mL/min — AB (ref >60)
GFR, EST NON AFRICAN AMERICAN: 25 mL/min — AB (ref >60)
GFR, EST NON AFRICAN AMERICAN: 25 mL/min — AB (ref >60)
GLUCOSE: 192 mg/dL — AB (ref 65–99)
Glucose, Bld: 233 mg/dL — ABNORMAL HIGH (ref 65–99)
Potassium: 2.8 mmol/L — ABNORMAL LOW (ref 3.5–5.1)
Potassium: 3.3 mmol/L — ABNORMAL LOW (ref 3.5–5.1)
SODIUM: 142 mmol/L (ref 135–145)
Sodium: 141 mmol/L (ref 135–145)

## 2015-10-11 LAB — CBC
HEMATOCRIT: 25.4 % — AB (ref 36.0–46.0)
HEMATOCRIT: 24.4 % — AB (ref 36.0–46.0)
Hemoglobin: 7.9 g/dL — ABNORMAL LOW (ref 12.0–15.0)
Hemoglobin: 7.6 g/dL — ABNORMAL LOW (ref 12.0–15.0)
MCH: 29.3 pg (ref 26.0–34.0)
MCH: 29.2 pg (ref 26.0–34.0)
MCHC: 31.1 g/dL (ref 30.0–36.0)
MCHC: 31.1 g/dL (ref 30.0–36.0)
MCV: 94.1 fL (ref 78.0–100.0)
MCV: 93.8 fL (ref 78.0–100.0)
PLATELETS: 74 10*3/uL — AB (ref 150–400)
Platelets: 82 10*3/uL — ABNORMAL LOW (ref 150–400)
RBC: 2.7 MIL/uL — ABNORMAL LOW (ref 3.87–5.11)
RBC: 2.6 MIL/uL — ABNORMAL LOW (ref 3.87–5.11)
RDW: 17.2 % — AB (ref 11.5–15.5)
RDW: 17.6 % — ABNORMAL HIGH (ref 11.5–15.5)
WBC: 13.1 10*3/uL — AB (ref 4.0–10.5)
WBC: 15 10*3/uL — ABNORMAL HIGH (ref 4.0–10.5)

## 2015-10-11 LAB — C3 COMPLEMENT: C3 COMPLEMENT: 100 mg/dL (ref 82–167)

## 2015-10-11 LAB — C4 COMPLEMENT: COMPLEMENT C4, BODY FLUID: 14 mg/dL (ref 14–44)

## 2015-10-11 LAB — ANTI-DNA ANTIBODY, DOUBLE-STRANDED

## 2015-10-11 LAB — GLUCOSE, CAPILLARY
GLUCOSE-CAPILLARY: 190 mg/dL — AB (ref 65–99)
GLUCOSE-CAPILLARY: 198 mg/dL — AB (ref 65–99)
GLUCOSE-CAPILLARY: 215 mg/dL — AB (ref 65–99)
Glucose-Capillary: 168 mg/dL — ABNORMAL HIGH (ref 65–99)
Glucose-Capillary: 152 mg/dL — ABNORMAL HIGH (ref 65–99)
Glucose-Capillary: 165 mg/dL — ABNORMAL HIGH (ref 65–99)
Glucose-Capillary: 164 mg/dL — ABNORMAL HIGH (ref 65–99)

## 2015-10-11 LAB — ALDOLASE: Aldolase: 15.6 U/L — ABNORMAL HIGH (ref 3.3–10.3)

## 2015-10-11 LAB — RHEUMATOID FACTOR: Rhuematoid fact SerPl-aCnc: 13.2 IU/mL (ref 0.0–13.9)

## 2015-10-11 MED ORDER — POTASSIUM CHLORIDE 20 MEQ/15ML (10%) PO SOLN
30.0000 meq | Freq: Once | ORAL | Status: AC
  Administered 2015-10-11: 30 meq
  Filled 2015-10-11: qty 30

## 2015-10-11 MED ORDER — MIDAZOLAM HCL 2 MG/2ML IJ SOLN: 4.0000 mg | Freq: Once | INTRAMUSCULAR | Status: DC

## 2015-10-11 MED ORDER — POTASSIUM CHLORIDE 10 MEQ/50ML IV SOLN
10.0000 meq | INTRAVENOUS | Status: AC
  Administered 2015-10-11 (×3): 10 meq via INTRAVENOUS
  Filled 2015-10-11 (×3): qty 50

## 2015-10-11 MED ORDER — ETOMIDATE 2 MG/ML IV SOLN
40.0000 mg | Freq: Once | INTRAVENOUS | Status: DC
  Filled 2015-10-11: qty 20

## 2015-10-11 MED ORDER — PROPOFOL 500 MG/50ML IV EMUL
5.0000 ug/kg/min | Freq: Once | INTRAVENOUS | Status: DC
  Filled 2015-10-11: qty 50

## 2015-10-11 MED ORDER — CHLORHEXIDINE GLUCONATE 0.12% ORAL RINSE (MEDLINE KIT)
15.0000 mL | Freq: Two times a day (BID) | OROMUCOSAL | Status: DC
  Administered 2015-10-11 (×2): 15 mL via OROMUCOSAL

## 2015-10-11 MED ORDER — ORAL CARE MOUTH RINSE
15.0000 mL | Freq: Four times a day (QID) | OROMUCOSAL | Status: DC
  Administered 2015-10-11 – 2015-10-12 (×3): 15 mL via OROMUCOSAL

## 2015-10-11 MED ORDER — VECURONIUM BROMIDE 10 MG IV SOLR: 10.0000 mg | Freq: Once | INTRAVENOUS | Status: DC

## 2015-10-11 MED ORDER — POTASSIUM CHLORIDE 20 MEQ/15ML (10%) PO SOLN
40.0000 meq | Freq: Once | ORAL | Status: AC
  Administered 2015-10-11: 40 meq
  Filled 2015-10-11: qty 30

## 2015-10-11 MED ORDER — DIPHENHYDRAMINE HCL 50 MG/ML IJ SOLN
25.0000 mg | Freq: Once | INTRAMUSCULAR | Status: AC
  Administered 2015-10-11: 25 mg via INTRAVENOUS
  Filled 2015-10-11: qty 0.5

## 2015-10-11 MED ORDER — FENTANYL CITRATE (PF) 100 MCG/2ML IJ SOLN: 200.0000 ug | Freq: Once | INTRAMUSCULAR | Status: DC

## 2015-10-11 NOTE — Progress Notes (Signed)
Family meeting scheduled at [facility]. Family aware. Spoke with patients son and daughter.

## 2015-10-11 NOTE — Progress Notes (Signed)
eLink Physician-Brief Progress Note Patient Name: Megan Sims DOB: January 26, 1955 MRN: [id number]   Date of Service  10/11/2015  HPI/Events of Note  K 2.8  eICU Interventions  Repleted        Megan Sims 10/11/2015, 6:18 AM

## 2015-10-11 NOTE — Progress Notes (Signed)
PULMONARY / CRITICAL CARE MEDICINE   Name: Megan Sims MRN: [id number] DOB: Aug 14, 1955    ADMISSION DATE:  09/25/2015 CONSULTATION DATE:  10/02/2015  REFERRING MD:  [facility] Dr. Erlinda Hong  CHIEF COMPLAINT:  Unresponsiveness. Hypovolemic shock  BRIEF: 60 year old female with a past medical history significant for lung cancer, dermatomyositis, and recurrent GI bleeding in the setting of prolonged steroid use presented on 10/20/2015 in the setting of hypovolemic shock. Had recurrent GI bleeds but was excessively extubated on 10/09/2015. However, on 10/10/2015 she required reintubation in the setting of profound weakness and inability to handle secretions.  SUBJECTIVE:  Reintubated on September 17 Otherwise no acute events   VITAL SIGNS: BP 127/76   Pulse 74   Temp 98.3 F (36.8 C) (Oral)   Resp 14   Ht '5\' 1"'$  (1.549 m)   Wt 61 kg (134 lb 8 oz)   SpO2 100%   BMI 25.41 kg/m   HEMODYNAMICS:    VENTILATOR SETTINGS: Vent Mode: PRVC FiO2 (%):  [40 %] 40 % Set Rate:  [14 bmp] 14 bmp Vt Set:  [500 mL] 500 mL PEEP:  [5 cmH20] 5 cmH20 Plateau Pressure:  [17 cmH20-18 cmH20] 18 cmH20  INTAKE / OUTPUT: I/O last 3 completed shifts: In: 4803.7 [I.V.:4063.7; Other:90; NG/GT:400; IV Piggyback:250] Out: 2690 [Urine:2690]  PHYSICAL EXAMINATION: General:  Resting comfortably on ventilator HEENT normocephalic atraumatic, endotracheal tube in place Pulmonary: Rhonchi bilaterally, ventilator supported breaths Cardiovascular: Regular rate and rhythm, no murmurs gallops or rubs GI: Bowel sounds positive, nontender nondistended Extremities: Trace edema in legs Dermatologic: As above trace edema, diffuse desquamative rash Neurologic: Sedated heavily on vent  LABS:  BMET  Recent Labs Lab 10/09/15 2000 10/10/15 0331 10/11/15 0451  NA 152* 151* 142  K 3.4* 3.9 2.8*  CL 120* 118* 111  CO2 '23 22 23  '$ BUN 82* 78* 74*  CREATININE 2.29* 2.22* 2.08*  GLUCOSE 118* 150* 233*    Electrolytes  Recent  Labs Lab 10/07/15 1703 10/08/15 0600 10/08/15 1825  10/09/15 2000 10/10/15 0331 10/11/15 0451  CALCIUM  --  7.7*  --   < > 8.4* 8.6* 8.2*  MG 2.1 1.9 1.7  --   --  2.3  --   PHOS 5.5* 4.5 4.4  --   --   --   --   < > = values in this interval not displayed.  CBC  Recent Labs Lab 10/09/15 0459 10/10/15 0331 10/11/15 0451  WBC 11.3* 15.5* 13.1*  HGB 8.0* 9.1* 7.9*  HCT 25.6* 29.3* 25.4*  PLT 77* 86* 74*    Coag's  Recent Labs Lab 10/08/15 1358 10/10/15 1610  INR 1.35 1.23    Sepsis Markers No results for input(s): LATICACIDVEN, PROCALCITON, O2SATVEN in the last 168 hours.  ABG  Recent Labs Lab 10/10/15 1145  PHART 7.506*  PCO2ART 28.3*  PO2ART 79.0*    Liver Enzymes No results for input(s): AST, ALT, ALKPHOS, BILITOT, ALBUMIN in the last 168 hours.  Cardiac Enzymes No results for input(s): TROPONINI, PROBNP in the last 168 hours.  Glucose  Recent Labs Lab 10/10/15 1632 10/10/15 1940 10/11/15 0004 10/11/15 0400 10/11/15 0750 10/11/15 1128  GLUCAP 153* 182* 215* 198* 168* 152*    Imaging Dg Chest Port 1 View  Result Date: 10/11/2015 CLINICAL DATA:  Pneumonia . EXAM: PORTABLE CHEST 1 VIEW COMPARISON:  10/10/2015. FINDINGS: Endotracheal tube, right IJ line, PowerPort catheter stable position. Persistent right upper lobe consult again noted . Mild bibasilar atelectasis and/or infiltrates noted. No  pleural effusion or pneumothorax. IMPRESSION: 1. Lines and tubes in stable position. 2. Persistent right upper lobe consolidation. Findings consist with pneumonia. Continued follow-up exams to demonstrate clearing and to exclude underlying mass lesion suggested. 3. Low lung volumes with mild bibasilar atelectasis and/or infiltrates. Electronically Signed   By: Marcello Moores  Register   On: 10/11/2015 07:08     STUDIES:  Echo 9/9 > EF 65-70%, DD 9/15 NM GI Bleeding >neg   CULTURES: Blood 9/9 > (-) Trache 9/9 > (-) MRSA 9/9 > (-) 9/17 Sputum  >>  ANTIBIOTICS: Vanc 9/8 > 9/11 Cefepime 9/8 - 9/15 Vanc 9/17 >. Merrem  9/17>>  SIGNIFICANT EVENTS: 9/8 admitted for possible HCAP 9/9 intubated for respiratory failure. Transferred to ICU 9/16 extubated  9/17 reintubated for increased wob/secretions  LINES/TUBES: L port R IJ 9/9 >  ETT 9/9 >9/16 >reintubated 9/17   DISCUSSION: 68 female, history of lung cancer, recent admit for healthcare associated pneumonia and GI bleed secondary to duodenal ulcer, admitted for altered mental status, sepsis, possible healthcare associated pneumonia again. Intubated for respiratory failure on 9/9 and profoundly anemic likely from GI source.   Extubated 9/16 , unable to manage secretions w/ increased wob 9/17 , reintubated.  Lengthy discussions held on 10/11/2015 with her oncology team as well as with family. See description below.   ASSESSMENT / PLAN:  PULMONARY A: Acute hypoxemic respiratory failure secondary to profound neuromuscular weakness and inability to handle secretions Healthcare associated pneumonia: Resolved H/O Lung CA stage 3 (08/2014)   P:   Continue full ventilator support VAP protocol  Evaluate for daily wean/SBT  No plans for tracheostomy  CARDIOVASCULAR A:  Hypovolemic shock 2/2 GI bleed. Resolved Sinus tachycardia Echo 9/10 EF 65-70, gr 1 DD .  P:  Tele   RENAL A:   AKI  Hypernatremia > improved post D5 Hypokalemia resolved  Hx of Pagets dz  P:   Stop D5, stop free water Monitor BMET and UOP Replace electrolytes as needed   GASTROINTESTINAL A:   GI bleed. Pt with DU but was noted NOT to be actively bleeding on 9/11. 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  S/P EGD on 9/6, 9/11, 9/13 Tagged NM scan neg 9/16  Dysphagia on Chronic Peg home   P:   Continue Protonix BID check cbc daily  Restart TF   HEMATOLOGIC A:   Severe anemia. Hemorrhagic and hypovolemic shock secondary to GI bleed. P:   Transfuse packed red blood cells to keep Hb > 7.  Daily Hb and Hct  INFECTIOUS A:   HCAP, R mid-lower. Possible aspiration pneumonia. P:   Hold all antibiotics   ENDOCRINE A:   Hyperglycemia on steroids  Chronic steroids ? Dermatomyositis  P:   SSI Continue Solu-Medrol  NEUROLOGIC A:   AMS / Encephalopathy Undergoing workup for muscle weakness/dermatomyositis at [place] [facility] (care everywhere )  P:   RASS goal: 0 -1 PAD protocol w/ fent drip and prn versed.    FAMILY  - daughter and husband updated by [facility] on 9/18, plan for more extensive goals of care conversation with family on 10/12/2015  - Updates: - Inter-disciplinary family meet or Palliative Care meeting due by:  Sept 18 Husband updated 9/17   Case discussed with Dr. Alvy Bimler who knows her well.  She tells me that she has been declining in the setting of her dermatomyositis and complications of the treatment (steroids).  She has encouraged the family  to consider limiting treatment given the severity of her disease.    CC time 50 minutes  Roselie Awkward, MD  PCCM Pager: [phone] Cell: [phone] After 3pm or if no response, call [phone]  10/11/2015 11:30 AM

## 2015-10-11 NOTE — Progress Notes (Signed)
eLink Physician-Brief Progress Note Patient Name: Megan Sims DOB: 01-09-1956 MRN: [id number]   Date of Service  10/11/2015  HPI/Events of Note  Low K  eICU Interventions  Replace K     Intervention Category Intermediate Interventions: Other:  Rush Landmark 10/11/2015, 6:58 PM

## 2015-10-11 NOTE — Progress Notes (Signed)
eLink Physician-Brief Progress Note Patient Name: Megan Sims DOB: 1955/11/07 MRN: [id number]   Date of Service  10/11/2015  HPI/Events of Note  Nurse reports lip and tongue swelling ? Angioedema from meropenem  eICU Interventions  Hold next dose of meropenem Continue steroids, add benadryl. Reassess in AM     Intervention Category Evaluation Type: Other  Candy Leverett 10/11/2015, 4:58 AM

## 2015-10-11 NOTE — Progress Notes (Signed)
Pt with new onset lip and facial swelling.  Dr. Vaughan Browner at [facility] notified.  Pt was started on meropenem 9/17 and last dose given at 2240 on 9/17.  Pt remains intubated and sedated, VSS.  Orders received for IV benadryl, pt given scheduled solu-medrol at 0415.

## 2015-10-12 ENCOUNTER — Inpatient Hospital Stay (HOSPITAL_COMMUNITY): Payer: Medicaid Other

## 2015-10-12 DIAGNOSIS — C3411 Malignant neoplasm of upper lobe, right bronchus or lung: Secondary | ICD-10-CM

## 2015-10-12 LAB — CBC
HCT: 24.9 % — ABNORMAL LOW (ref 36.0–46.0)
Hemoglobin: 7.6 g/dL — ABNORMAL LOW (ref 12.0–15.0)
MCH: 28.7 pg (ref 26.0–34.0)
MCHC: 30.5 g/dL (ref 30.0–36.0)
MCV: 94 fL (ref 78.0–100.0)
PLATELETS: 89 10*3/uL — AB (ref 150–400)
RBC: 2.65 MIL/uL — ABNORMAL LOW (ref 3.87–5.11)
RDW: 17.7 % — AB (ref 11.5–15.5)
WBC: 14.3 10*3/uL — AB (ref 4.0–10.5)

## 2015-10-12 LAB — GLUCOSE, CAPILLARY
GLUCOSE-CAPILLARY: 114 mg/dL — AB (ref 65–99)
GLUCOSE-CAPILLARY: 133 mg/dL — AB (ref 65–99)
GLUCOSE-CAPILLARY: 168 mg/dL — AB (ref 65–99)
Glucose-Capillary: 188 mg/dL — ABNORMAL HIGH (ref 65–99)
Glucose-Capillary: 179 mg/dL — ABNORMAL HIGH (ref 65–99)
Glucose-Capillary: 129 mg/dL — ABNORMAL HIGH (ref 65–99)

## 2015-10-12 LAB — BASIC METABOLIC PANEL
ANION GAP: 10 (ref 5–15)
BUN: 80 mg/dL — ABNORMAL HIGH (ref 6–20)
CALCIUM: 8.3 mg/dL — AB (ref 8.9–10.3)
CO2: 22 mmol/L (ref 22–32)
Chloride: 111 mmol/L (ref 101–111)
Creatinine, Ser: 2 mg/dL — ABNORMAL HIGH (ref 0.44–1.00)
GFR calc Af Amer: 30 mL/min — ABNORMAL LOW (ref >60)
GFR, EST NON AFRICAN AMERICAN: 26 mL/min — AB (ref >60)
GLUCOSE: 206 mg/dL — AB (ref 65–99)
Potassium: 3.3 mmol/L — ABNORMAL LOW (ref 3.5–5.1)
SODIUM: 143 mmol/L (ref 135–145)

## 2015-10-12 LAB — ANTINUCLEAR ANTIBODIES, IFA: ANTINUCLEAR ANTIBODIES, IFA: NEGATIVE

## 2015-10-12 MED ORDER — INSULIN ASPART 100 UNIT/ML ~~LOC~~ SOLN
0.0000 [IU] | SUBCUTANEOUS | Status: DC
  Administered 2015-10-12: 2 [IU] via SUBCUTANEOUS
  Administered 2015-10-12: 3 [IU] via SUBCUTANEOUS
  Administered 2015-10-12: 2 [IU] via SUBCUTANEOUS
  Administered 2015-10-13: 3 [IU] via SUBCUTANEOUS
  Administered 2015-10-13 (×2): 2 [IU] via SUBCUTANEOUS
  Administered 2015-10-13: 3 [IU] via SUBCUTANEOUS
  Administered 2015-10-13 – 2015-10-14 (×2): 2 [IU] via SUBCUTANEOUS
  Administered 2015-10-14: 5 [IU] via SUBCUTANEOUS
  Administered 2015-10-15: 2 [IU] via SUBCUTANEOUS
  Administered 2015-10-15: 3 [IU] via SUBCUTANEOUS
  Administered 2015-10-15: 5 [IU] via SUBCUTANEOUS
  Administered 2015-10-15 (×2): 3 [IU] via SUBCUTANEOUS
  Administered 2015-10-15: 2 [IU] via SUBCUTANEOUS

## 2015-10-12 MED ORDER — POTASSIUM CHLORIDE 20 MEQ/15ML (10%) PO SOLN
20.0000 meq | Freq: Once | ORAL | Status: AC
  Administered 2015-10-12: 20 meq via ORAL
  Filled 2015-10-12: qty 15

## 2015-10-12 MED ORDER — SODIUM CHLORIDE 0.9 % IV SOLN: INTRAVENOUS | Status: DC

## 2015-10-12 MED ORDER — FENTANYL CITRATE (PF) 100 MCG/2ML IJ SOLN
12.5000 ug | INTRAMUSCULAR | Status: DC | PRN
  Administered 2015-10-12 – 2015-10-15 (×2): 25 ug via INTRAVENOUS
  Administered 2015-10-15: 12.5 ug via INTRAVENOUS
  Filled 2015-10-12 (×3): qty 2

## 2015-10-12 MED ORDER — PREDNISONE 20 MG PO TABS
20.0000 mg | ORAL_TABLET | Freq: Every day | ORAL | Status: DC
  Administered 2015-10-13 – 2015-10-14 (×2): 20 mg via ORAL
  Filled 2015-10-12 (×3): qty 1

## 2015-10-12 NOTE — Procedures (Signed)
2Extubation Procedure Note  Patient Details:   Name: Megan Sims DOB: 09-07-1955 MRN: [id number]   Airway Documentation:     Evaluation  O2 sats: stable throughout Complications: No apparent complications Patient did tolerate procedure well. Bilateral Breath Sounds: Rhonchi   Yes  4l/min [place] IS instructed 213m's, will give flutter valve.  GRevonda Standard9/19/2017, 11:39 AM

## 2015-10-12 NOTE — Progress Notes (Signed)
PULMONARY / CRITICAL CARE MEDICINE   Name: Megan Sims MRN: [id number] DOB: 1955-02-20    ADMISSION DATE:  10/14/2015 CONSULTATION DATE:  10/02/2015  REFERRING MD:  [facility] Dr. Erlinda Hong  CHIEF COMPLAINT:  Unresponsiveness. Hypovolemic shock  BRIEF: 60 year old female with a past medical history significant for lung cancer, dermatomyositis, and recurrent GI bleeding in the setting of prolonged steroid use presented on 09/28/2015 in the setting of hypovolemic shock. Had recurrent GI bleeds but was excessively extubated on 10/09/2015. However, on 10/10/2015 she required reintubation in the setting of profound weakness and inability to handle secretions.  SUBJECTIVE:  No acute events   VITAL SIGNS: BP (!) 165/80   Pulse (!) 124   Temp 98.2 F (36.8 C) (Oral)   Resp (!) 22   Ht '5\' 1"'$  (1.549 m)   Wt 61.5 kg (135 lb 9.3 oz)   SpO2 99%   BMI 25.62 kg/m   HEMODYNAMICS:    VENTILATOR SETTINGS: Vent Mode: PSV;CPAP FiO2 (%):  [40 %] 40 % Set Rate:  [14 bmp] 14 bmp Vt Set:  [500 mL] 500 mL PEEP:  [5 cmH20] 5 cmH20 Pressure Support:  [5 cmH20-8 cmH20] 5 cmH20 Plateau Pressure:  [12 cmH20-14 cmH20] 12 cmH20  INTAKE / OUTPUT: I/O last 3 completed shifts: In: 4838.6 [I.V.:2295.3; Other:240; NG/GT:1853.3; IV Piggyback:450] Out: 7001 [Urine:1556; Drains:65]  PHYSICAL EXAMINATION: General:  Resting comfortably on ventilator HEENT normocephalic atraumatic, endotracheal tube in place Pulmonary: Rhonchi bilaterally, ventilator supported breaths Cardiovascular: Regular rate and rhythm, no murmurs gallops or rubs GI: Bowel sounds positive, nontender nondistended Extremities: Trace edema in legs Dermatologic: As above trace edema, diffuse desquamative rash Neurologic: Sedated heavily on vent  LABS:  BMET  Recent Labs Lab 10/11/15 0451 10/11/15 1751 10/12/15 0438  NA 142 141 143  K 2.8* 3.3* 3.3*  CL 111 110 111  CO2 '23 22 22  '$ BUN 74* 73* 80*  CREATININE 2.08* 2.04* 2.00*  GLUCOSE 233*  192* 206*    Electrolytes  Recent Labs Lab 10/07/15 1703 10/08/15 0600 10/08/15 1825  10/10/15 0331 10/11/15 0451 10/11/15 1751 10/12/15 0438  CALCIUM  --  7.7*  --   < > 8.6* 8.2* 8.1* 8.3*  MG 2.1 1.9 1.7  --  2.3  --   --   --   PHOS 5.5* 4.5 4.4  --   --   --   --   --   < > = values in this interval not displayed.  CBC  Recent Labs Lab 10/11/15 0451 10/11/15 1714 10/12/15 0438  WBC 13.1* 15.0* 14.3*  HGB 7.9* 7.6* 7.6*  HCT 25.4* 24.4* 24.9*  PLT 74* 82* 89*    Coag's  Recent Labs Lab 10/08/15 1358 10/10/15 1610  INR 1.35 1.23    Sepsis Markers No results for input(s): LATICACIDVEN, PROCALCITON, O2SATVEN in the last 168 hours.  ABG  Recent Labs Lab 10/10/15 1145  PHART 7.506*  PCO2ART 28.3*  PO2ART 79.0*    Liver Enzymes No results for input(s): AST, ALT, ALKPHOS, BILITOT, ALBUMIN in the last 168 hours.  Cardiac Enzymes No results for input(s): TROPONINI, PROBNP in the last 168 hours.  Glucose  Recent Labs Lab 10/11/15 1128 10/11/15 1525 10/11/15 2022 10/11/15 2319 10/12/15 0424 10/12/15 0809  GLUCAP 152* 165* 164* 190* 188* 168*    Imaging Dg Chest Port 1 View  Result Date: 10/12/2015 CLINICAL DATA:  Go acute respiratory failure with hypoxemia EXAM: PORTABLE CHEST 1 VIEW COMPARISON:  10/11/2015 FINDINGS: Support devices are stable.  Consolidation in the right upper lobe is again noted, stable. Increasing airspace disease in the left lower lobe. Heart is normal size. No visible effusions. No acute bony abnormality. IMPRESSION: Continued right upper lobe consolidation. New airspace disease/ consolidation in the left lower lobe. Findings compatible with multifocal pneumonia. Electronically Signed   By: Rolm Baptise M.D.   On: 10/12/2015 07:42     STUDIES:  Echo 9/9 > EF 65-70%, DD 9/15 NM GI Bleeding >neg   CULTURES: Blood 9/9 > (-) Trache 9/9 > (-) MRSA 9/9 > (-) 9/17 Sputum >>  ANTIBIOTICS: Vanc 9/8 > 9/11 Cefepime 9/8 -  9/15 Vanc 9/17 > Merrem  9/17>>  SIGNIFICANT EVENTS: 9/8 admitted for possible HCAP 9/9 intubated for respiratory failure. Transferred to ICU 9/16 extubated  9/17 reintubated for increased wob/secretions  LINES/TUBES: L port R IJ 9/9 >  ETT 9/9 >9/16 >reintubated 9/17   DISCUSSION: 57 female, history of lung cancer, recent admit for healthcare associated pneumonia and GI bleed secondary to duodenal ulcer, admitted for altered mental status, sepsis, possible healthcare associated pneumonia again. Intubated for respiratory failure on 9/9 and profoundly anemic likely from GI source.   Extubated 9/16 , unable to manage secretions w/ increased wob 9/17 , reintubated.  Lengthy discussions held on 10/11/2015 with her oncology team as well as with family. See description below.    ASSESSMENT / PLAN:  PULMONARY A: Acute hypoxemic respiratory failure secondary to profound neuromuscular weakness and inability to handle secretions Healthcare associated pneumonia: Resolved H/O Lung CA stage 3 (08/2014)   P:   Continue full ventilator support VAP protocol  Evaluate for daily wean/SBT  No plans for tracheostomy Consider extubation today  CARDIOVASCULAR A:  Hypovolemic shock 2/2 GI bleed. Resolved Sinus tachycardia Echo 9/10 EF 65-70, gr 1 DD .  P:  Tele   RENAL A:   AKI  Hypernatremia > improved post D5 Hypokalemia resolved  Hx of Pagets dz  P:   Monitor BMET and UOP Replace electrolytes as needed   GASTROINTESTINAL A:   GI bleed. Pt with DU but was noted NOT to be actively bleeding on 9/11. 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  S/P EGD on 9/6, 9/11, 9/13 Tagged NM scan neg 9/16  Dysphagia on Chronic Peg home   P:   Continue Protonix BID check cbc daily  Restart TF   HEMATOLOGIC A:   Anemia from bleeding as above P:  Transfuse packed red blood cells to keep Hb > 7.  Daily Hb and Hct  INFECTIOUS A:    HCAP New infiltrate left lower lobe 9/19 but no signs of pneumonia P:   Hold all antibiotics   ENDOCRINE A:   Hyperglycemia on steroids  Chronic steroids ? Dermatomyositis  P:   SSI D/C Solu-Medrol Start prednisone '20mg'$  daily, taper  NEUROLOGIC A:   AMS / Encephalopathy Undergoing workup for muscle weakness/dermatomyositis at [place] [facility] (care everywhere )  P:   RASS goal: 0 -1 PAD protocol w/ fent drip and prn versed.    FAMILY  - plan family discussion today  - Updates: - Inter-disciplinary family meet or Palliative Care meeting due by:  Sept 18 Husband updated 9/17   CC time 34 minutes  Roselie Awkward, MD [place] PCCM Pager: [phone] Cell: [phone] After 3pm or if no response, call [phone]  10/12/2015 9:26 AM

## 2015-10-12 NOTE — Care Management Note (Signed)
Case Management Note  Patient Details  Name: Megan Sims MRN: [id number] Date of Birth: 1955-04-21  Subjective/Objective:       Pt admitted after being found unresponsive with hypovolemic shock             Action/Plan:  Pt is from home with husband - has had 8 admits in the past 6 months.  Per last CM note ; family was to follow up with PCP on placement into a facility.  CM informed CSW of pending referral.  PT/OT will be ordered when pt is medically stable.  Pt is currently intubated.  CM will continue to follow for discharge needs  CM informed by agency that pt is active with [facility].   Expected Discharge Date:                  Expected Discharge Plan:  Skilled Nursing Facility  In-House Referral:  Clinical Social Work  Discharge planning Services  CM Consult  Post Acute Care Choice:    Choice offered to:     DME Arranged:    DME Agency:     HH Arranged:    [place] Agency:     Status of Service:  In process, will continue to follow  If discussed at Long Length of Stay Meetings, dates discussed:    Additional Comments: 10/12/2015 Discussed in LOS 10/12/15:  Remains appropriate for continued stay.  Pt extubated today.  Palliative Consult ordered for possible recurrent incurable CA.  CM will continue to follow for discharge needs Maryclare Labrador, RN 10/12/2015, 11:56 AM

## 2015-10-12 NOTE — Progress Notes (Signed)
200 mcg fentanyl wasted in sink. Witnessed by Cloretta Ned.

## 2015-10-12 NOTE — Progress Notes (Signed)
[facility] PCCM  Lengthy discussion with family.  They would like to avoid tracheostomy.  They worry about discussing code status with her as this stressed her out before.  They would like to try extubation, but want to talk further about code status with palliative care.  Roselie Awkward, MD [place] PCCM Pager: [phone] Cell: [phone] After 3pm or if no response, call [phone]

## 2015-10-12 NOTE — Plan of Care (Signed)
  Interdisciplinary Goals of Care Family Meeting   Date carried out:: 10/12/2015  Location of the meeting: Bedside  Member's involved: Physician, Bedside Registered Nurse and Family Member or next of kin  Durable Power of Attorney or acting medical decision maker: N/A    Discussion: We discussed goals of care for [facility] .  I came back by to talk to Ms. Tamez and discuss code status.  She tells me that she does not want to undergo CPR or go on a ventilator again.  I explained both of those procedures would not help her and would cause more harm than good.  She agreed.  I explained this to her daughter Katrina who agreed, this is also in keeping with the spirit of the conversation we had with her family earlier today who said that they didn't want her to undergo life support or CPR but they were afraid to talk to her about it for fear of upsetting her.  Code status: Full DNR  Disposition: Continue current acute care, consult palliative medicine  Time spent for the meeting: 25 minutes  Simonne Maffucci 10/12/2015, 4:41 PM

## 2015-10-12 NOTE — Progress Notes (Signed)
[facility] PCCM  I came back by to talk to Ms. Dress and discuss code status.  She tells me that she does not want to undergo CPR or go on a ventilator again.  I explained both of those procedures would not help her and would cause more harm than good.  She agreed.  I explained this to her daughter Katrina who agreed, this is also in keeping with the spirit of the conversation we had with her family earlier today who said that they didn't want her to undergo life support or CPR but they were afraid to talk to her about it for fear of upsetting her.  Roselie Awkward, MD [place] PCCM Pager: [phone] Cell: [phone] After 3pm or if no response, call [phone]

## 2015-10-13 DIAGNOSIS — Z515 Encounter for palliative care: Secondary | ICD-10-CM

## 2015-10-13 DIAGNOSIS — J9601 Acute respiratory failure with hypoxia: Secondary | ICD-10-CM

## 2015-10-13 DIAGNOSIS — Z7189 Other specified counseling: Secondary | ICD-10-CM

## 2015-10-13 LAB — CBC
HEMATOCRIT: 25.1 % — AB (ref 36.0–46.0)
HEMOGLOBIN: 7.8 g/dL — AB (ref 12.0–15.0)
MCH: 28.9 pg (ref 26.0–34.0)
MCHC: 31.1 g/dL (ref 30.0–36.0)
MCV: 93 fL (ref 78.0–100.0)
Platelets: 85 10*3/uL — ABNORMAL LOW (ref 150–400)
RBC: 2.7 MIL/uL — AB (ref 3.87–5.11)
RDW: 17.5 % — AB (ref 11.5–15.5)
WBC: 13.2 10*3/uL — AB (ref 4.0–10.5)

## 2015-10-13 LAB — BASIC METABOLIC PANEL
ANION GAP: 7 (ref 5–15)
BUN: 87 mg/dL — ABNORMAL HIGH (ref 6–20)
CALCIUM: 8.5 mg/dL — AB (ref 8.9–10.3)
CO2: 25 mmol/L (ref 22–32)
Chloride: 113 mmol/L — ABNORMAL HIGH (ref 101–111)
Creatinine, Ser: 1.87 mg/dL — ABNORMAL HIGH (ref 0.44–1.00)
GFR, EST AFRICAN AMERICAN: 33 mL/min — AB (ref >60)
GFR, EST NON AFRICAN AMERICAN: 28 mL/min — AB (ref >60)
GLUCOSE: 144 mg/dL — AB (ref 65–99)
POTASSIUM: 3.1 mmol/L — AB (ref 3.5–5.1)
Sodium: 145 mmol/L (ref 135–145)

## 2015-10-13 LAB — CULTURE, RESPIRATORY: CULTURE: NORMAL

## 2015-10-13 LAB — GLUCOSE, CAPILLARY
GLUCOSE-CAPILLARY: 122 mg/dL — AB (ref 65–99)
GLUCOSE-CAPILLARY: 183 mg/dL — AB (ref 65–99)
Glucose-Capillary: 126 mg/dL — ABNORMAL HIGH (ref 65–99)
Glucose-Capillary: 138 mg/dL — ABNORMAL HIGH (ref 65–99)
Glucose-Capillary: 140 mg/dL — ABNORMAL HIGH (ref 65–99)
Glucose-Capillary: 175 mg/dL — ABNORMAL HIGH (ref 65–99)

## 2015-10-13 LAB — CULTURE, RESPIRATORY W GRAM STAIN

## 2015-10-13 MED ORDER — POTASSIUM CHLORIDE 20 MEQ/15ML (10%) PO SOLN
40.0000 meq | Freq: Once | ORAL | Status: AC
  Administered 2015-10-13: 40 meq
  Filled 2015-10-13: qty 30

## 2015-10-13 NOTE — Progress Notes (Signed)
SLP Cancellation Note  Patient Details Name: Megan Sims MRN: [id number] DOB: [birth year]   Cancelled treatment:       Reason Eval/Treat Not Completed: Other (comment) Pt currently asleep. RN requests that pt be allowed to continue resting as she has not been getting sleep. Pt is nutritionally supported via PEG and has a h/o chronic dysphagia with most recent recommendation from SLP last month to remain NPO except for ice chips. Will f/u as able.   Germain Osgood 10/13/2015, 9:06 AM  Germain Osgood, M.A. CCC-SLP [phone]

## 2015-10-13 NOTE — Progress Notes (Signed)
VC 0.40, NIF pt unable to do at this time. RT will continue to monitor.

## 2015-10-13 NOTE — Progress Notes (Signed)
Patient has been consistently refusing to wear blood pressure cuff, oxygen saturation probe, and actively removing patient monitoring equipment.  Patient defiantly throwing items on the floor and swatting at staff.  Patient refusing multiple care interventions such as peri care, bathing and medications.

## 2015-10-13 NOTE — Progress Notes (Signed)
Patient is adamant she does not want to wear blood pressure cuff.

## 2015-10-13 NOTE — Progress Notes (Signed)
PULMONARY / CRITICAL CARE MEDICINE   Name: Megan Sims MRN: [id number] DOB: 10/04/1955    ADMISSION DATE:  10/13/2015 CONSULTATION DATE:  10/02/2015  REFERRING MD:  [facility] Dr. Erlinda Hong  CHIEF COMPLAINT:  Unresponsiveness. Hypovolemic shock  BRIEF: 60 year old female with a past medical history significant for lung cancer, dermatomyositis, and recurrent GI bleeding in the setting of prolonged steroid use presented on 10/01/2015 in the setting of hypovolemic shock. Had recurrent GI bleeds but was successfully extubated on 10/09/2015. However, on 10/10/2015 she required reintubation in the setting of profound weakness and inability to handle secretions.  SUBJECTIVE:   Extubated 9/19, tolerated well.  Family / patient discussion > DNR/DNI.    VITAL SIGNS: BP (!) 188/92   Pulse 99   Temp 97.6 F (36.4 C) (Oral)   Resp (!) 27   Ht '5\' 1"'$  (1.549 m)   Wt 132 lb 11.5 oz (60.2 kg)   SpO2 94%   BMI 25.08 kg/m   HEMODYNAMICS:    VENTILATOR SETTINGS:    INTAKE / OUTPUT: I/O last 3 completed shifts: In: 2632.4 [I.V.:352.4; Other:230; NG/GT:2050] Out: 2327 [Urine:2162; Drains:165]  PHYSICAL EXAMINATION: General:  Resting comfortably on ventilator HEENT normocephalic atraumatic, endotracheal tube in place Pulmonary: Rhonchi bilaterally, ventilator supported breaths Cardiovascular: Regular rate and rhythm, no murmurs gallops or rubs GI: Bowel sounds positive, nontender nondistended Extremities: Trace edema in legs Dermatologic: As above trace edema, diffuse desquamative rash Neurologic: Sedated heavily on vent  LABS:  BMET  Recent Labs Lab 10/11/15 1751 10/12/15 0438 10/13/15 0536  NA 141 143 145  K 3.3* 3.3* 3.1*  CL 110 111 113*  CO2 '22 22 25  '$ BUN 73* 80* 87*  CREATININE 2.04* 2.00* 1.87*  GLUCOSE 192* 206* 144*    Electrolytes  Recent Labs Lab 10/07/15 1703 10/08/15 0600 10/08/15 1825  10/10/15 0331  10/11/15 1751 10/12/15 0438 10/13/15 0536  CALCIUM  --  7.7*  --   < >  8.6*  < > 8.1* 8.3* 8.5*  MG 2.1 1.9 1.7  --  2.3  --   --   --   --   PHOS 5.5* 4.5 4.4  --   --   --   --   --   --   < > = values in this interval not displayed.  CBC  Recent Labs Lab 10/11/15 1714 10/12/15 0438 10/13/15 0536  WBC 15.0* 14.3* 13.2*  HGB 7.6* 7.6* 7.8*  HCT 24.4* 24.9* 25.1*  PLT 82* 89* 85*    Coag's  Recent Labs Lab 10/08/15 1358 10/10/15 1610  INR 1.35 1.23    Sepsis Markers No results for input(s): LATICACIDVEN, PROCALCITON, O2SATVEN in the last 168 hours.  ABG  Recent Labs Lab 10/10/15 1145  PHART 7.506*  PCO2ART 28.3*  PO2ART 79.0*    Liver Enzymes No results for input(s): AST, ALT, ALKPHOS, BILITOT, ALBUMIN in the last 168 hours.  Cardiac Enzymes No results for input(s): TROPONINI, PROBNP in the last 168 hours.  Glucose  Recent Labs Lab 10/12/15 1135 10/12/15 1637 10/12/15 2036 10/12/15 2344 10/13/15 0539 10/13/15 0911  GLUCAP 179* 133* 129* 114* 138* 122*    Imaging No results found.   STUDIES:  Echo 9/9 > EF 65-70%, DD NM GI Bleeding 9/15 > neg   CULTURES: Blood 9/9 > (-) Trache 9/9 > (-) MRSA 9/9 > (-) 9/17 Sputum > normal flora  ANTIBIOTICS: Vanc 9/8 > 9/11 Cefepime 9/8 - 9/15 Vanc 9/17 > 9/18 Merrem  9/17 > 9/18  SIGNIFICANT EVENTS: 9/08  admitted for possible HCAP 9/09  intubated for respiratory failure. Transferred to ICU 9/16  extubated  9/17  reintubated for increased wob/secretions 9/19  Extubated, DNR/DNI   LINES/TUBES: L port R IJ 9/9 >  ETT 9/9 >9/16 >reintubated 9/17   DISCUSSION: 71 female, history of lung cancer, recent admit for healthcare associated pneumonia and GI bleed secondary to duodenal ulcer, admitted for altered mental status, sepsis, possible healthcare associated pneumonia again. Intubated for respiratory failure on 9/9 and profoundly anemic likely from GI source.  Extubated 9/16, unable to manage secretions w/ increased wob 9/1 , reintubated.  Lengthy discussions held  on 10/11/2015 with her oncology team as well as with family.  Extubated 9/19 > DNR/DNI    ASSESSMENT / PLAN:  PULMONARY A: Acute hypoxemic respiratory failure secondary to profound neuromuscular weakness and inability to handle secretions Healthcare associated pneumonia: Resolved H/O Lung CA stage 3 (08/2014)   P:   DNI  Pulmonary hygiene > mobilize as able, cough / deep breath PRN morphine for increased work of breathing   CARDIOVASCULAR A:  Hypovolemic shock 2/2 GI bleed. Resolved Sinus tachycardia Echo 9/10 EF 65-70, gr 1 DD .  P:  DNR  Transfer to med-surg floor   RENAL A:   AKI  Hypernatremia - improved post D5 Hypokalemia resolved  Hx of Pagets dz  P:   Monitor BMET and UOP Replace electrolytes as needed Lasix 20 mg QD    GASTROINTESTINAL A:   GI bleed. Pt with DU but was noted NOT to be actively bleeding on 9/11. 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  S/P EGD on 9/6, 9/11, 9/13 Tagged NM scan neg 9/16  Dysphagia on Chronic Peg home   P:   Continue Protonix BID Intermittent CBC  TF - Vital AF @ 30m/hr   HEMATOLOGIC A:   Anemia from bleeding as above P:  Transfusion per ICU guidelines, Hgb <7% Monitor Hgb / evidence of bleeding   INFECTIOUS A:   HCAP New infiltrate left lower lobe 9/19 but no signs of pneumonia P:   Hold all antibiotics Monitor fever curve   ENDOCRINE A:   Hyperglycemia on steroids  Chronic steroids ? Dermatomyositis  P:   SSI Prednisone '20mg'$  daily, taper back to baseline of 10 mg QD  NEUROLOGIC A:   AMS / Encephalopathy - resolved Undergoing work[facility] for muscle weakness[facility])  P:   PRN morphine for pain / increased work of breathing.    FAMILY   - Updates: - Inter-disciplinary family meet or Palliative Care meeting due by:  Husband updated 9/17. No family available am 9/20.  Goals of care confirmed on 9/19, see  prior notes.    Transition patient to medical floor, TRH service as of 9/21.     BNoe Gens NP-C [facility] Pgr: [phone] or if no answer [phone]/20/2017, 1:05 PM

## 2015-10-13 NOTE — Consult Note (Signed)
Consultation Note Date: 10/13/2015   Patient Name: Megan Sims  DOB: 05-20-1955  MRN: [id number]  Age / Sex: 60 y.o., female  PCP: Megan Gravel, MD Referring Physician: Rush Landmark, MD  Reason for Consultation: Establishing goals of care and Hospice Evaluation  HPI/Patient Profile: 60 y.o. female  with past medical history of lung cancer with chemo and radiation in Fall 2016, dermatomyositis, prolonged steriod use,  CHF, steven-johnson disease, hypertension, emphysema, cerebral aneurysm, anemia, dysphagia with PEG placement in May, and recent admit for GI bleed due to duodenal ulcer admitted on 09/30/2015 with respiratory distress, increased mucus production in the setting of chronic hemoptysis, and generalized weakness. Recent admit from 9/5-9/7 with GI bleed and HCAP, sent home on Levaquin. Hemoglobin 8.4 on admit. Patient sent to stepdown and unfortunately went unresponsive-code called and patient intubated and transferred to ICU. At that time, hemoglobin dropped to 3.5 and patient was urgently transfused 3 units PRBC. Extubated on 9/16 but required reintubation on 9/17. Patient then extubated 9/19 and there have been extensive conversations with patient and family regarding code status. Patient does not want to be re-intubated or resuscitated. DNR. GI consulted and closely following. Patient has had 2 EGD's this hospitalization. 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 Tagged RBC scan negative. Unclear of mild bleeding episode location but stable for now and GI continuing to follow. Patient receiving protonix BID. Palliative Medicine consultation for goals of care and possible hospice candidate.   Clinical Assessment and Goals of Care: Dr. Rowe Sims and I met with patient, husband, and friend at bedside. Megan Sims is alert, oriented, and following commands. She denies pain. She tells  me that she feels better with the tube out of her mouth. Husband, Megan Sims tells [place] she has been living at home and that she is basically bed bound. He states it has been more challenging lately to care for her at home but he does seem to have a large support system, including family, neighbors, and [employer] agent, Megan Sims. Patient would like to see herself get back to "normal" and return home and be with her family.   Discussed concern about multiple hospitalizations in the last six months, risk for recurrent respiratory distress and aspiration with PEG, and also risk of recurrent bleeding from stomach ulcer and lesion. At this point, patient and family are happy that she is breathing well on her own without the tube and are hopeful for continued improvement. She tells [place] that she wants to get out of bed and work with physical therapy. Discussed disposition regarding ? Rehab at SNF or home with hospice services. Husband tells [place] that she already has a hospital bed and bedside commode. He states she has children and neighbors that can stay with her during the day and he is able to be with her at night. Patient and family seem willing to further discuss hospice services in the days to come.   NEXT OF Mitchel Honour  SUMMARY OF RECOMMENDATIONS    DNR/DNI per patient and family.  Physical therapy consult--patient wants to get out of bed and see what she can do.  Will continue to discuss goals of care and home hospice services with family throughout hospitalization.   Disposition: May need SNF for rehab and then home with hospice services.  PMT will continue to follow and offer recommendations as needed.   Code Status/Advance Care Planning:  DNR   Symptom Management:   Per attending  Palliative Prophylaxis:   Aspiration, Delirium Protocol and Frequent Pain Assessment  Additional Recommendations (Limitations, Scope, Preferences):  Full Scope Treatment-except  DNR/DNI  Psycho-social/Spiritual:   Desire for further Chaplaincy support:yes  Additional Recommendations: Caregiving  Support/Resources and Education on Hospice  Prognosis:   < 3 months-in the setting of recurrent hospitalizations, risk for aspiration/respiratory distress, and functional decline.   Discharge Planning: To Be Determined SNF for rehab vs. Home with hospice     Primary Diagnoses: Present on Admission: . Essential hypertension . Bilateral lung cancer ([place]) . Paget's disease of bone . Oral thrush . Nicotine dependence . Chronic neck pain . Cancer of upper lobe of right lung ([place]) . Sepsis due to pneumonia ([place]) . Anemia due to other cause . Dehydration . Deficiency anemia . Duodenal ulcer hemorrhagic . HCAP (healthcare-associated pneumonia) . Hemorrhagic shock   I have reviewed the medical record, interviewed the patient and family, and examined the patient. The following aspects are pertinent.  Past Medical History:  Diagnosis Date  . Anemia 08/03/2015  . Cancer [facility])    malignant neoplasm of unknown origin  . Cerebral aneurysm 2 brain surgeries 96 or 97  . Chronic neck pain 09/24/2014  . Diarrhea   . Emphysema of lung ([place]) 05/05/2015  . Hypertension   . Hyponatremia   . Left knee pain   . Left leg weakness 06/2015  . Lung abnormality   . Lung cancer ([place]) 10/07/2014  . Malignant neoplasm of unknown origin ([place])   . Migraine   . Nicotine dependence 09/24/2014  . Oral thrush 09/24/2014  . Paget disease of bone   . Pleurisy   . Renal insufficiency    Patient states " no kidney problems  . S/P biopsy    of throat per patient.  . Skin ulcer ([place]) 11/18/2014  . 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 Marital status: Married    Spouse name: N/A  . Number of children: N/A  . Years of education: N/A   Social History Main Topics  . Smoking status: Current Every Day Smoker    Packs/day: 0.50    Years: 34.00     Types: Cigarettes  . Smokeless tobacco: Never Used     Comment: HOWEVER, will smoke 1 cigarette if "tragic" event (death), or if she needs it for her nerves   . Alcohol use No  . Drug use: No  . Sexual activity: Not Asked   Other Topics Concern  . None   Social History Narrative  . None   Family History  Problem Relation Age of Onset  . Hypertension    . Diabetes    . Kidney disease    . Cancer Mother     throat ca  . Cancer Maternal Grandmother     thyroid ca  . Colon cancer Neg Hx    Scheduled Meds: . budesonide (PULMICORT) nebulizer solution  0.5 mg Nebulization BID  . furosemide  20 mg Intravenous Daily  .  insulin aspart  0-15 Units Subcutaneous Q4H  . ipratropium-albuterol  3 mL Nebulization TID  . pantoprazole  40 mg Intravenous Q12H  . predniSONE  20 mg Oral Q breakfast   Continuous Infusions: . sodium chloride    . feeding supplement (VITAL AF 1.2 CAL) 1,000 mL (10/12/15 2102)  . fentaNYL infusion INTRAVENOUS Stopped (10/12/15 1031)   PRN Meds:.fentaNYL (SUBLIMAZE) injection, hydrALAZINE, morphine CONCENTRATE, sodium chloride flush, white petrolatum Medications Prior to Admission:  Prior to Admission medications   Medication Sig Start Date End Date Taking? Authorizing Provider  albuterol (PROVENTIL HFA;VENTOLIN HFA) 108 (90 Base) MCG/ACT inhaler Inhale 1-2 puffs into the lungs every 6 (six) hours as needed for wheezing or shortness of breath.   Yes Historical Provider, MD  Amino Acids-Protein Hydrolys (FEEDING SUPPLEMENT, PRO-STAT SUGAR FREE 64,) LIQD Place 30 mLs into feeding tube daily. 09/30/15  Yes Jessica U Vann, DO  amLODipine (NORVASC) 10 MG tablet Take 1 tablet (10 mg total) by mouth daily. 08/30/15  Yes Heath Lark, MD  fentaNYL (DURAGESIC - DOSED MCG/HR) 25 MCG/HR patch Place 1 patch (25 mcg total) onto the skin every 3 (three) days. 09/21/15  Yes Heath Lark, MD  furosemide (LASIX) 20 MG tablet Place 20 mg into feeding tube daily.   Yes Historical  Provider, MD  hydrocortisone 2.5 % ointment Apply 1 application topically 2 (two) times daily. To face 09/20/15  Yes Historical Provider, MD  metoprolol tartrate (LOPRESSOR) 25 MG tablet Take 0.5 tablets (12.5 mg total) by mouth 2 (two) times daily. 09/10/15  Yes Donne Hazel, MD  morphine (ROXANOL) 20 MG/ML concentrated solution Place 1 mL (20 mg total) into feeding tube every 2 (two) hours as needed for severe pain. 09/21/15  Yes Heath Lark, MD  Nutritional Supplements (FEEDING SUPPLEMENT, JEVITY 1.2 CAL,) LIQD Place 392 mLs into feeding tube every 3 (three) hours. 09/30/15  Yes Geradine Girt, DO  nystatin (MYCOSTATIN) 100000 UNIT/ML suspension Take 5 mLs by mouth 4 (four) times daily. Swish and spit for thrush 09/20/15  Yes Historical Provider, MD  pantoprazole sodium (PROTONIX) 40 mg/20 mL PACK Place 20 mLs (40 mg total) into feeding tube 2 (two) times daily. 09/30/15  Yes Geradine Girt, DO  predniSONE (DELTASONE) 5 MG tablet Take by mouth once daily as directed: 15 mg daily for 3 days the 10 mg daily. Patient taking differently: Take 10 mg by mouth daily with breakfast.  09/24/15  Yes Heath Lark, MD  triamcinolone ointment (KENALOG) 0.1 % Apply 1 application topically 2 (two) times daily. From neck down. Do not use on face.. 09/20/15  Yes Historical Provider, MD  Water For Irrigation, Sterile (FREE WATER) SOLN Place 100 mLs into feeding tube every 6 (six) hours. 09/30/15  Yes Geradine Girt, DO  zolpidem (AMBIEN) 10 MG tablet Take 10 mg by mouth at bedtime as needed for sleep (sleep).   Yes Historical Provider, MD  levofloxacin (LEVAQUIN) 750 MG tablet Place 1 tablet (750 mg total) into feeding tube daily. 09/30/15   Geradine Girt, DO   Allergies  Allergen Reactions  . Hydrocodone-Acetaminophen Swelling    Increases secretions and sensation of throat swelling  . Demeclocycline Other (See Comments)    PILLESOPHAGITIS  . Tetracyclines & Related     PILLESOPHAGITIS  . Tramadol     SWELLING   Review  of Systems  Constitutional: Positive for activity change and fatigue.  Gastrointestinal: Positive for diarrhea.  Neurological: Positive for weakness.     Physical Exam  Constitutional: She is oriented to person, place, and time. She is cooperative.  Cardiovascular: Regular rhythm and normal heart sounds.   Pulmonary/Chest: She has rhonchi.  Abdominal: Soft. Bowel sounds are normal. She exhibits no distension. There is no tenderness.  PEG tube in place  Musculoskeletal: She exhibits edema (BLE and RUE).  Neurological: She is alert and oriented to person, place, and time.  Skin: Skin is warm and dry.  Psychiatric: She has a normal mood and affect. Her speech is normal and behavior is normal. Thought content normal.  Nursing note and vitals reviewed.   Vital Signs: BP (!) 147/75   Pulse (!) 108   Temp 98.3 F (36.8 C) (Oral)   Resp (!) 23   Ht _0  (1.549 m)   Wt 60.2 kg (132 lb 11.5 oz)   SpO2 95%   BMI 25.08 kg/m  Pain Assessment: 0-10 POSS *See Group Information*: S-Acceptable,Sleep, easy to arouse Pain Score: 10-Worst pain ever   SpO2: SpO2: 95 % O2 Device:SpO2: 95 % O2 Flow Rate: .O2 Flow Rate (L/min): 4 L/min  IO: Intake/output summary:   Intake/Output Summary (Last 24 hours) at 10/13/15 1728 Last data filed at 10/13/15 1600  Gross per 24 hour  Intake             1050 ml  Output             1425 ml  Net             -375 ml    LBM: Last BM Date: 10/13/15 Baseline Weight: Weight: 57.6 kg (127 lb) Most recent weight: Weight: 60.2 kg (132 lb 11.5 oz)     Palliative Assessment/Data: PPS 30%   Flowsheet Rows   Flowsheet Row Most Recent Value  Intake Tab  Referral Department  Critical care  Unit at Time of Referral  ICU  Palliative Care Primary Diagnosis  Pulmonary  Date Notified  10/13/15  Palliative Care Type  New Palliative care  Reason for referral  Clarify Goals of Care, Counsel Regarding Hospice  Date of Admission  10/10/2015  Date first seen by  Palliative Care  10/13/15  # of days Palliative referral response time  0 Day(s)  # of days IP prior to Palliative referral  12  Clinical Assessment  Palliative Performance Scale Score  30%  Psychosocial & Spiritual Assessment  Palliative Care Outcomes  Patient/Family meeting held?  Yes  Who was at the meeting?  husband and friend  Palliative Care Outcomes  Clarified goals of care, Counseled regarding hospice      Time In: 1545 Time Out: 2197 Time Total: 38mn Greater than 50%  of this time was spent counseling and coordinating care related to the above assessment and plan.  Signed by:  MIhor Dow FNP-C Palliative Medicine Team  Phone: [phone]: [phone]  Addendum: ZLoistine ChanceMD Agree with above note and recommendations.  Patient seen and examined [phone]   Please contact Palliative Medicine Team phone at [phone] questions and concerns.  For individual provider: See AShea Evans

## 2015-10-14 ENCOUNTER — Ambulatory Visit (HOSPITAL_COMMUNITY): Payer: Medicaid Other | Admitting: Speech Pathology

## 2015-10-14 ENCOUNTER — Encounter (HOSPITAL_COMMUNITY): Payer: Self-pay | Admitting: General Practice

## 2015-10-14 LAB — GLUCOSE, CAPILLARY
GLUCOSE-CAPILLARY: 120 mg/dL — AB (ref 65–99)
GLUCOSE-CAPILLARY: 165 mg/dL — AB (ref 65–99)
GLUCOSE-CAPILLARY: 240 mg/dL — AB (ref 65–99)
Glucose-Capillary: 132 mg/dL — ABNORMAL HIGH (ref 65–99)
Glucose-Capillary: 194 mg/dL — ABNORMAL HIGH (ref 65–99)
Glucose-Capillary: 212 mg/dL — ABNORMAL HIGH (ref 65–99)

## 2015-10-14 LAB — BASIC METABOLIC PANEL
ANION GAP: 8 (ref 5–15)
BUN: 94 mg/dL — ABNORMAL HIGH (ref 6–20)
CALCIUM: 8.3 mg/dL — AB (ref 8.9–10.3)
CHLORIDE: 113 mmol/L — AB (ref 101–111)
CO2: 26 mmol/L (ref 22–32)
Creatinine, Ser: 1.82 mg/dL — ABNORMAL HIGH (ref 0.44–1.00)
GFR calc Af Amer: 34 mL/min — ABNORMAL LOW (ref >60)
GFR calc non Af Amer: 29 mL/min — ABNORMAL LOW (ref >60)
GLUCOSE: 132 mg/dL — AB (ref 65–99)
POTASSIUM: 3.8 mmol/L (ref 3.5–5.1)
Sodium: 147 mmol/L — ABNORMAL HIGH (ref 135–145)

## 2015-10-14 LAB — CBC
HCT: 22.6 % — ABNORMAL LOW (ref 36.0–46.0)
Hemoglobin: 7 g/dL — ABNORMAL LOW (ref 12.0–15.0)
MCH: 29.2 pg (ref 26.0–34.0)
MCHC: 31 g/dL (ref 30.0–36.0)
MCV: 94.2 fL (ref 78.0–100.0)
PLATELETS: 67 10*3/uL — AB (ref 150–400)
RBC: 2.4 MIL/uL — AB (ref 3.87–5.11)
RDW: 17.6 % — AB (ref 11.5–15.5)
WBC: 13.1 10*3/uL — ABNORMAL HIGH (ref 4.0–10.5)

## 2015-10-14 MED ORDER — JEVITY 1.2 CAL PO LIQD
1000.0000 mL | ORAL | Status: DC
  Administered 2015-10-14 – 2015-10-16 (×2): 1000 mL
  Filled 2015-10-14 (×7): qty 1000

## 2015-10-14 MED ORDER — PREDNISONE 5 MG PO TABS
10.0000 mg | ORAL_TABLET | Freq: Every day | ORAL | Status: DC
  Administered 2015-10-15 – 2015-10-16 (×2): 10 mg via ORAL
  Filled 2015-10-14 (×2): qty 2

## 2015-10-14 MED ORDER — ADULT MULTIVITAMIN LIQUID CH
15.0000 mL | Freq: Every day | ORAL | Status: DC
  Administered 2015-10-15 – 2015-10-16 (×2): 15 mL
  Filled 2015-10-14 (×3): qty 15

## 2015-10-14 NOTE — Progress Notes (Signed)
Patient arrived from [facility] around 0230, has PEG tube and is resting comfortably, will continue to monitor.

## 2015-10-14 NOTE — Progress Notes (Signed)
PROGRESS NOTE  Megan Sims  FTD:[id number] DOB: November 10, 1955 DOA: 10/03/2015 PCP: Jani Gravel, MD   Brief Narrative: Megan Sims is a 60 y.o. 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 She has experienced recurrent GI bleeds, followed by GI with multiple EGD's with intervention. She was treated for HCAP, cultures negative, extubated 9/16 and reintubated 9/18 due to inability to handle secretions and weakness. She was again extubated 9/19 at which time pt was declared a DNR/DNI per patient and family discussion with CCM. She has continued with nasal cannula and was transferred to the floor 9/20.   Assessment & Plan: Principal Problem:   Sepsis due to pneumonia [facility]) Active Problems:   Dysphagia   Essential hypertension   Bilateral lung cancer ([place])   Paget's disease of bone   Oral thrush   Nicotine dependence   Chronic neck pain   Cancer of upper lobe of right lung (HCC)   Anemia due to other cause   Dehydration   Deficiency anemia   HCAP (healthcare-associated pneumonia)   Duodenal ulcer hemorrhagic   Hemorrhagic shock   UGIB (upper gastrointestinal bleed)   Acute respiratory failure ([place])   Palliative care encounter   Goals of care, counseling/discussion   Encounter for hospice care discussion  Acute hypoxemic respiratory failure: improving s/p HCAP treatment and correction of profound anemia due to GI bleeding, extubation 9/19, now requiring only supplemental oxygen. Last CXR 9/19 showed LLL infiltrate? though no signs of recurrent pneumonia. - DNI/DNR - Monitor for signs/symptoms of pneumonia, consider repeat CXR - Morphine prn SOB  Stage III Lung CA: - No inpatient therapy planned  Acute blood loss anemia:  - Continue to monitor CBC and signs of bleeding - Transfuse if hgb drops < 7, if stable would attempt to minimize blood draws in  absence of rebleeding symptoms.  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 Tagged NM scan neg 9/16.  - Continue protonix BID in setting of chronic steroids - GI following peripherally, would call if any rebleeding  Dysphagia on Chronic PEG home:  - Continue tube feeding Vital AF '@50cc'$ /hr  Dermatomyositis: Chornic, stable, on prednisone. ?cause of weakness. - Tapered to prednisone '10mg'$  9/20, monitor BP - CBGs q4h, moderate SSI for hyperglycemia due to steroids - Morphine prn pain  Hypovolemic shock: Due to profound anemia from GI bleeding, resolved s/p interventions by GI and transfusions.    DVT prophylaxis: SCDs  Code Status: DNR/DNI Family Communication: Discussed with daughter, [place] at bedside Disposition Plan: Monitor blood loss and respiratory status on the floor.  Consultants:   GI, Dr. Havery Moros  Procedures:   Echo 9/9: EF 06-23%, diastolic dysfunction  Tagged RBC scan 9/15: Negative  R IJ placed 9/9 >   Antimicrobials: Vanc 9/8 > 9/11 Cefepime 9/8 - 9/15 Vanc 9/17 > 9/18 Merrem  9/17 > 9/18  Subjective: Pt without complaints. Dyspnea is stable/improving. No stools or signs of bleeding.   Objective: Vitals:   10/14/15 0642 10/14/15 0919 10/14/15 0944 10/14/15 1340  BP: (!) 141/58 (!) 151/66  116/61  Pulse: (!) 107 (!) 105 (!) 105 99  Resp: '16 16 16 16  '$ Temp: 99 F (37.2 C) 98.7 F (37.1 C)  98.4 F (36.9 C)  TempSrc:  Oral  Oral  SpO2: 98%  96% 97% 100%  Weight:      Height:        Intake/Output Summary (Last 24 hours) at 10/14/15 1451 Last data filed at 10/14/15 0100  Gross per 24 hour  Intake              560 ml  Output              910 ml  Net             -350 ml   Filed Weights   10/12/15 0200 10/13/15 0500 10/14/15 0230  Weight: 61.5 kg (135 lb 9.3 oz) 60.2 kg (132 lb 11.5 oz) 61.1 kg (134 lb 11.2 oz)    Examination: General exam: 60 y.o. female in no distress  sitting on bedside chair. Pallorous. Respiratory system: Non-labored breathing, nasal cannula in place. Bilateral rhonchi scattered. Raspy voice, no stridor. Cardiovascular system: Regular rate and rhythm. No murmur, rub, or gallop. No JVD, and no pedal edema. +RIJ Gastrointestinal system: Abdomen soft, non-tender, non-distended, with normoactive bowel sounds. No organomegaly or masses felt. +PEG Central nervous system: Alert and oriented. No focal neurological deficits. Extremities: Warm, no deformities Skin: No rashes Psychiatry: Judgement and insight appear normal. Mood & affect appropriate.   Data Reviewed: I have personally reviewed following labs and imaging studies  CBC:  Recent Labs Lab 10/11/15 0451 10/11/15 1714 10/12/15 0438 10/13/15 0536 10/14/15 0206  WBC 13.1* 15.0* 14.3* 13.2* 13.1*  HGB 7.9* 7.6* 7.6* 7.8* 7.0*  HCT 25.4* 24.4* 24.9* 25.1* 22.6*  MCV 94.1 93.8 94.0 93.0 94.2  PLT 74* 82* 89* 85* 67*   Basic Metabolic Panel:  Recent Labs Lab 10/07/15 1703  10/08/15 0600 10/08/15 1825  10/10/15 0331 10/11/15 0451 10/11/15 1751 10/12/15 0438 10/13/15 0536 10/14/15 0206  NA  --   --  150*  --   < > 151* 142 141 143 145 147*  K  --   < > 2.8* 3.7  < > 3.9 2.8* 3.3* 3.3* 3.1* 3.8  CL  --   --  119*  --   < > 118* 111 110 111 113* 113*  CO2  --   --  22  --   < > '22 23 22 22 25 26  '$ GLUCOSE  --   --  133*  --   < > 150* 233* 192* 206* 144* 132*  BUN  --   --  85*  --   < > 78* 74* 73* 80* 87* 94*  CREATININE  --   --  2.34*  --   < > 2.22* 2.08* 2.04* 2.00* 1.87* 1.82*  CALCIUM  --   --  7.7*  --   < > 8.6* 8.2* 8.1* 8.3* 8.5* 8.3*  MG 2.1  --  1.9 1.7  --  2.3  --   --   --   --   --   PHOS 5.5*  --  4.5 4.4  --   --   --   --   --   --   --   < > = values in this interval not displayed. GFR: Estimated Creatinine Clearance: 27.6 mL/min (by C-G formula based on SCr of 1.82 mg/dL (H)). Liver Function Tests: No results for input(s): AST, ALT, ALKPHOS,  BILITOT, PROT, ALBUMIN in the last 168 hours. No results for input(s): LIPASE, AMYLASE in the last 168 hours. No results for input(s): AMMONIA in the last 168 hours. Coagulation Profile:  Recent Labs Lab  10/08/15 1358 10/10/15 1610  INR 1.35 1.23   Cardiac Enzymes:  Recent Labs Lab 10/10/15 1430  CKTOTAL 319*   BNP (last 3 results) No results for input(s): PROBNP in the last 8760 hours. HbA1C: No results for input(s): HGBA1C in the last 72 hours. CBG:  Recent Labs Lab 10/13/15 1950 10/13/15 2341 10/14/15 0402 10/14/15 0749 10/14/15 1123  GLUCAP 140* 126* 120* 165* 194*   Lipid Profile: No results for input(s): CHOL, HDL, LDLCALC, TRIG, CHOLHDL, LDLDIRECT in the last 72 hours. Thyroid Function Tests: No results for input(s): TSH, T4TOTAL, FREET4, T3FREE, THYROIDAB in the last 72 hours. Anemia Panel: No results for input(s): VITAMINB12, FOLATE, FERRITIN, TIBC, IRON, RETICCTPCT in the last 72 hours. Urine analysis:    Component Value Date/Time   COLORURINE YELLOW 10/02/2015 1732   APPEARANCEUR CLOUDY (A) 10/02/2015 1732   LABSPEC 1.017 10/02/2015 1732   PHURINE 6.0 10/02/2015 1732   GLUCOSEU 100 (A) 10/02/2015 1732   HGBUR MODERATE (A) 10/02/2015 1732   BILIRUBINUR NEGATIVE 10/02/2015 1732   KETONESUR NEGATIVE 10/02/2015 1732   PROTEINUR 30 (A) 10/02/2015 1732   UROBILINOGEN 0.2 10/25/2014 0021   NITRITE NEGATIVE 10/02/2015 1732   LEUKOCYTESUR NEGATIVE 10/02/2015 1732   Sepsis Labs: '@LABRCNTIP'$ (procalcitonin:4,lacticidven:4)  ) Recent Results (from the past 240 hour(s))  Culture, respiratory (NON-Expectorated)     Status: None   Collection Time: 10/10/15 12:06 PM  Result Value Ref Range Status   Specimen Description TRACHEAL ASPIRATE  Final   Special Requests NONE  Final   Gram Stain   Final    ABUNDANT WBC PRESENT, PREDOMINANTLY PMN FEW SQUAMOUS EPITHELIAL CELLS PRESENT FEW GRAM NEGATIVE RODS RARE GRAM POSITIVE COCCI IN CLUSTERS RARE GRAM NEGATIVE  COCCI IN PAIRS    Culture Consistent with normal respiratory flora.  Final   Report Status 10/13/2015 FINAL  Final     Radiology Studies: No results found.  Scheduled Meds: . budesonide (PULMICORT) nebulizer solution  0.5 mg Nebulization BID  . furosemide  20 mg Intravenous Daily  . insulin aspart  0-15 Units Subcutaneous Q4H  . ipratropium-albuterol  3 mL Nebulization TID  . pantoprazole  40 mg Intravenous Q12H  . predniSONE  20 mg Oral Q breakfast   Continuous Infusions: . sodium chloride Stopped (10/13/15 1000)  . feeding supplement (VITAL AF 1.2 CAL) 1,000 mL (10/13/15 1848)     LOS: 13 days   Time spent: 25 minutes.  Vance Gather, MD [facility] Pager [phone]  If 7PM-7AM, please contact night-coverage [email or web address] Password [email or web address] 10/14/2015, 2:51 PM

## 2015-10-14 NOTE — Evaluation (Signed)
Physical Therapy Evaluation Patient Details Name: Megan Sims MRN: [id number] DOB: 02-27-1955 Today's Date: 10/14/2015   History of Present Illness  60 year old female with a past medical history significant for lung cancer, dermatomyositis, and recurrent GI bleeding in the setting of prolonged steroid use presented on 10/01/2015 in the setting of hypovolemic shock. Had recurrent GI bleeds but was successfully extubated on 10/09/2015. However, on 10/10/2015 she required reintubation in the setting of profound weakness and inability to handle secretions.  Clinical Impression  Patient presents with severe debilitation and weakness.  She will benefit from continued skilled PT in the acute setting to address deficits and progress to allow pt to d/c home with family support.  Not sure if will set up for Hospice or will need HHPT/aide.      Follow Up Recommendations Home health PT;Supervision/Assistance - 24 hour ([place] aide versus Hospice)    Equipment Recommendations  None recommended by PT    Recommendations for Other Services       Precautions / Restrictions Precautions Precautions: Fall Precaution Comments: PEG, fall with L LE fx and repair on 08/06/15 Restrictions LLE Weight Bearing: Partial weight bearing LLE Partial Weight Bearing Percentage or Pounds: 50%      Mobility  Bed Mobility Overal bed mobility: Needs Assistance Bed Mobility: Rolling;Sidelying to Sit Rolling: Max assist Sidelying to sit: Total assist       General bed mobility comments: encouraged to assist with railing  Transfers Overall transfer level: Needs assistance   Transfers: Squat Pivot Transfers     Squat pivot transfers: Total assist;+2 physical assistance     General transfer comment: daugthter helping, pt attempting to help, but too weak to stand  Ambulation/Gait                Stairs            Wheelchair Mobility    Modified Rankin (Stroke Patients Only)       Balance Overall  balance assessment: Needs assistance Sitting-balance support: Feet supported Sitting balance-Leahy Scale: Poor Sitting balance - Comments: minguard at least at EOB due to weakness Initially more assist (mod) then improved during session       Standing balance comment: too weak to stand                             Pertinent Vitals/Pain Faces Pain Scale: Hurts a little bit Pain Location: L LE Pain Descriptors / Indicators: Discomfort Pain Intervention(s): Limited activity within patient's tolerance;Monitored during session    Home Living Family/patient expects to be discharged to:: Private residence Living Arrangements: Spouse/significant other;Children Available Help at Discharge: Family;Available 24 hours/day (daughter, sister, son, etc, take turns) Type of Home: House Home Access: Ramped entrance     Home Layout: One level Home Equipment: Wheelchair - [employer] - 2 wheels;Bedside commode;Hospital bed Additional Comments: sponge bathes    Prior Function Level of Independence: Needs assistance      ADL's / Homemaking Assistance Needed: daughter assists with dressing and bathing        Hand Dominance   Dominant Hand: Right    Extremity/Trunk Assessment   Upper Extremity Assessment: RUE deficits/detail;LUE deficits/detail RUE Deficits / Details: unable to lift shoulders antigravity, elbow flexion R 3/5     LUE Deficits / Details: unable to lift shoulders antigravity, elbow flexion R 2+/5   Lower Extremity Assessment: RLE deficits/detail;LLE deficits/detail RLE Deficits / Details: ankle AROM WFL, knee/hip AAROM WFL, strength  hip flexion 2-/5, knee extension 2/5, ankle DF 3/5 LLE Deficits / Details: ankle AAROM limited to about -30 DF, knee/hip AAROM WFL with stiffness in hip, strength hip flexion 1/5, knee extension 2-/5, ankle DF 2/5; noted curved shin due to bone disease     Communication   Communication:  (whispers)  Cognition Arousal/Alertness:  Awake/alert Behavior During Therapy: WFL for tasks assessed/performed Overall Cognitive Status: Impaired/Different from baseline                      General Comments      Exercises General Exercises - Lower Extremity Ankle Circles/Pumps: AROM;AAROM;Both;5 reps;Supine Short Arc Quad: AAROM;Both;Supine;Other reps (comment) (3) Heel Slides: AAROM;Both;Supine;Other reps (comment) (3)   Assessment/Plan    PT Assessment Patient needs continued PT services  PT Problem List Decreased strength;Decreased activity tolerance;Decreased balance;Decreased mobility;Decreased cognition;Decreased knowledge of use of DME;Decreased safety awareness;Pain;Decreased skin integrity          PT Treatment Interventions DME instruction;Therapeutic activities;Therapeutic exercise;Patient/family education;Functional mobility training;Balance training;Wheelchair mobility training    PT Goals (Current goals can be found in the Care Plan section)  Acute Rehab PT Goals Patient Stated Goal: to get OOB PT Goal Formulation: With patient/family Time For Goal Achievement: 10/28/15 Potential to Achieve Goals: Fair    Frequency Min 3X/week   Barriers to discharge        Co-evaluation               End of Session Equipment Utilized During Treatment: Gait belt Activity Tolerance: Patient limited by fatigue Patient left: in chair;with call bell/phone within reach;with chair alarm set;with family/visitor present Nurse Communication: Mobility status         Time: 9242-6834 PT Time Calculation (min) (ACUTE ONLY): 37 min   Charges:   PT Evaluation $PT Eval High Complexity: 1 Procedure PT Treatments $Therapeutic Activity: 8-22 mins   PT G CodesReginia Naas 2015-11-06, 4:33 PM  Magda Kiel, [place] 11-06-2015

## 2015-10-14 NOTE — Progress Notes (Signed)
Nutrition Follow-up   INTERVENTION:  Change TF regimen. Provide Jevity 1.2 @ 70 ml/hr for 20 hours daily (1200 hr to 0800 hr) to provide 1680 kcal, 78 grams of protein, and 1134 ml of fluid.  Provide Multivitamin via PEG daily  Recommend providing 70 ml free water flushes every 4 hours to provide an additional 420 ml daily.  NUTRITION DIAGNOSIS:   Inadequate oral intake related to inability to eat as evidenced by NPO status.  ongoing  GOAL:   Patient will meet greater than or equal to 90% of their needs  Being met  MONITOR:   TF tolerance, Skin, I & O's, Labs  REASON FOR ASSESSMENT:    (Home tube feeder+ventilator, intubated in midst of assessment)    ASSESSMENT:   60 y.o.femalewith medical history significant for Lung cancer, not currently on treatment, history of dermatomyositis, CHF,and a recent admission for GI bleed due to duodenal ulcer on PPI (H pylori by US biopsy pending) from 9/5 through 09/30/2015,at which time, she was diagnosed with HCAP, sent home with Levaquin, failing to take the medicine, now presenting on 9/8 with worsening respiratory symptoms. Intubated 9/9 for respiratory failure. Patient continues to have BRBPR.  Pt was extubated on 9/19. She continues to receive tube feeding with Vital AF 1.2 @50  ml/hr via PEG. Daughter at bedside at time of visit and reports that patient is tolerating TF well. MD at bedside at time of visit; RD received permission to change TF back to home TF formula. RD discussed plans for changing TF with family. Rather than switch back to bolus feeds like patient received at home, we will infuse TF over 20 hours until breathing and aspiration risk improve.   Labs: glucose ranging 120 to 194 mg/dL, low hemoglobin, high sodium, low calcium, high BUN, elevated creatinine  Diet Order:  Diet NPO time specified  Skin:  Wound (see comment) (stage II pressure ulcer on buttocks)  Last BM:  9/20  Height:   Ht Readings from Last 1  Encounters:  10/13/2015 5' 1"  (1.549 m)    Weight:   Wt Readings from Last 1 Encounters:  10/14/15 134 lb 11.2 oz (61.1 kg)    Ideal Body Weight:  47.73 kg  BMI:  Body mass index is 25.45 kg/m.  Estimated Nutritional Needs:   Kcal:  1600-1800  Protein:  77-88 grams  Fluid:  2 L/day  EDUCATION NEEDS:   No education needs identified at this time  Pepper Pike, CSP, LDN Inpatient Clinical Dietitian Pager: [phone] After Hours Pager: [phone]

## 2015-10-14 NOTE — Progress Notes (Signed)
Chaplain presented to the patient's room this am, the patient is sleeping at the time of this visit. There was family present, introduced self as Chaplain and informed them I will come back at a later time for follow up.They were appreciative of the visit. [place] [phone]

## 2015-10-14 NOTE — Evaluation (Signed)
Clinical/Bedside Swallow Evaluation Patient Details  Name: Megan Sims MRN: [id number] Date of Birth: Oct 24, 1955  Today's Date: 10/14/2015 Time: SLP Start Time (ACUTE ONLY): 9509 SLP Stop Time (ACUTE ONLY): 0850 SLP Time Calculation (min) (ACUTE ONLY): 16 min  Past Medical History:  Past Medical History:  Diagnosis Date  . Anemia 08/03/2015  . Cancer [facility])    malignant neoplasm of unknown origin  . Cerebral aneurysm 2 brain surgeries 96 or 97  . Chronic neck pain 09/24/2014  . Diarrhea   . Emphysema of lung ([place]) 05/05/2015  . Hypertension   . Hyponatremia   . Left knee pain   . Left leg weakness 06/2015  . Lung abnormality   . Lung cancer ([place]) 10/07/2014  . Malignant neoplasm of unknown origin ([place])   . Migraine   . Nicotine dependence 09/24/2014  . Oral thrush 09/24/2014  . Paget disease of bone   . Pleurisy   . Renal insufficiency    Patient states " no kidney problems  . S/P biopsy    of throat per patient.  . Skin ulcer ([place]) 11/18/2014  . 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 CEREBRAL ANEURYSM REPAIR  96 or 97  . CHEST TUBE INSERTION    . COLONOSCOPY     10 years ago in [place]   . ESOPHAGOGASTRODUODENOSCOPY  06/16/2015   Dr. Gala Romney: edentulous cricopharyngeus, esophageal plaques, biopsy consistent with candida  . ESOPHAGOGASTRODUODENOSCOPY N/A 06/16/2015   Procedure: ESOPHAGOGASTRODUODENOSCOPY (EGD);  Surgeon: Daneil Dolin, MD;  [facility];  Service: Endoscopy;  Laterality: N/A;  . ESOPHAGOGASTRODUODENOSCOPY N/A 07/06/2015   Procedure: ESOPHAGOGASTRODUODENOSCOPY (EGD);  Surgeon: Danie Binder, MD;  [facility];  Service: Endoscopy;  Laterality: N/A;  . ESOPHAGOGASTRODUODENOSCOPY N/A 09/29/2015   Procedure: ESOPHAGOGASTRODUODENOSCOPY (EGD);  Surgeon: Irene Shipper, MD;  Location[facility];  Service: Endoscopy;  Laterality: N/A;  . ESOPHAGOGASTRODUODENOSCOPY N/A  10/09/2015   Procedure: ESOPHAGOGASTRODUODENOSCOPY (EGD);  Surgeon: Manus Gunning, MD;  Location: [facility];  Service: Gastroenterology;  Laterality: N/A;  . ESOPHAGOGASTRODUODENOSCOPY N/A 10/15/2015   Procedure: ESOPHAGOGASTRODUODENOSCOPY (EGD);  Surgeon: Manus Gunning, MD;  Location: [facility];  Service: Gastroenterology;  Laterality: N/A;  . INTRAMEDULLARY (IM) NAIL INTERTROCHANTERIC Left 08/07/2015   Procedure: INTRAMEDULLARY (IM) NAIL INTERTROCHANTRIC;  Surgeon: Melrose Nakayama, MD;  Location: [facility];  Service: Orthopedics;  Laterality: Left;  . LARYNGOSCOPY  Jun 22, 2015   [facility], Dr. Erik Obey: normal oropharynx, no lesions, mobile vocal cords with good airway.  . PEG PLACEMENT N/A 07/06/2015   Procedure: PERCUTANEOUS ENDOSCOPIC GASTROSTOMY (PEG) PLACEMENT;  Surgeon: Danie Binder, MD;  [facility];  Service: Endoscopy;  Laterality: N/A;  . TUBAL LIGATION     HPI:  60 yr old with recent discharge initially admitted 8/16 for pneumonia possible sepsis. Admited 8/23 with concern for left arm swelling in bilateral leg swelling left greater than right. PMH: right-sided lung cancer stage III nonoperable s/p radiation 2016, cerebral aneurysm, emphysema, Stevens-Johnson disease, dermatomyositis, pill esophagitis due to tetracyline, candida esophagitis, dysphagia, PEG, pna. MBS performed 07/05/15 with severe pharyngeal dysphagia; severe cervical dysphagia, gross pharyngeal residuals, aspiration from pyriforms, NPO recommended. Bedside swallow 08/2015 recommended continue NPO. Code Blue 09/29/15 intubated, extubated 9/16 and reintubated 9/17, extubated concern for aspiration pna, extubated 9/19.    Assessment / Plan / Recommendation Clinical Impression  Pt currently witth significant risk factors for aspiration including dysphagia history, subsequent radiation,  dermatomyositis, intubation x 2 this admission for total of 13 days. Pt currently NPO with Peg feedings. Vocal  quality hoarse with low intensity, weak volitional cough, significantly decreased laryngeal elevation with palpation of swallow. Consumed ice chips with suspected delayed swallow initiation and decreased laryngeal elevation without overt indications aspiration. MBS not recommended at present due to aforementioned. Recommend home health tx versus outpatient. Daughter in agreement and reported pt has been scheduled for speech therapy but has "ended up in the hospital before each appointment."        Aspiration Risk  Severe aspiration risk    Diet Recommendation NPO   Medication Administration: Via alternative means    Other  Recommendations Oral Care Recommendations: Oral care QID   Follow up Recommendations        Frequency and Duration            Prognosis        Swallow Study   General HPI: 60 yr old with recent discharge initially admitted 8/16 for pneumonia possible sepsis. Admited 8/23 with concern for left arm swelling in bilateral leg swelling left greater than right. PMH: right-sided lung cancer stage III nonoperable s/p radiation 2016, cerebral aneurysm, emphysema, Stevens-Johnson disease, dermatomyositis, pill esophagitis due to tetracyline, candida esophagitis, dysphagia, PEG, pna. MBS performed 07/05/15 with severe pharyngeal dysphagia; severe cervical dysphagia, gross pharyngeal residuals, aspiration from pyriforms, NPO recommended. Bedside swallow 08/2015 recommended continue NPO. Code Blue 09/29/15 intubated, extubated 9/16 and reintubated 9/17, extubated concern for aspiration pna, extubated 9/19.  Type of Study: Bedside Swallow Evaluation Previous Swallow Assessment:  (see HPI) Diet Prior to this Study: NPO;PEG tube Temperature Spikes Noted: No Respiratory Status: Nasal cannula History of Recent Intubation: Yes Length of Intubations (days): 13 days Date extubated: 10/12/15 Behavior/Cognition: Alert;Cooperative Oral Cavity Assessment: Other (comment) (active bleeding  loser lip upon arrival) Oral Care Completed by SLP: Yes Oral Cavity - Dentition: Missing dentition Vision: Functional for self-feeding Patient Positioning: Upright in bed Baseline Vocal Quality: Hoarse;Low vocal intensity Volitional Cough: Weak Volitional Swallow:  (attempted, very diminished)    Oral/Motor/Sensory Function Overall Oral Motor/Sensory Function: Within functional limits   Ice Chips Ice chips: Impaired Presentation: Spoon Pharyngeal Phase Impairments: Suspected delayed Swallow;Decreased hyoid-laryngeal movement   Thin Liquid Thin Liquid: Not tested    Nectar Thick Nectar Thick Liquid: Not tested   Honey Thick Honey Thick Liquid: Not tested   Puree Puree: Not tested   Solid   GO   Solid: Not tested        Houston Siren 10/14/2015,10:13 AM   Orbie Pyo Colvin Caroli.Ed [employer] [phone]

## 2015-10-14 NOTE — Progress Notes (Signed)
Physical Therapy Treatment Patient Details Name: Megan Sims MRN: [id number] DOB: 1955/04/28 Today's Date: 10/14/2015    History of Present Illness 60 year old female with a past medical history significant for lung cancer, dermatomyositis, and recurrent GI bleeding in the setting of prolonged steroid use presented on 10/01/2015 in the setting of hypovolemic shock. Had recurrent GI bleeds but was successfully extubated on 10/09/2015. However, on 10/10/2015 she required reintubation in the setting of profound weakness and inability to handle secretions.    PT Comments    Patient assisted back to bed due to profound weakness.  Will need capable 24 hour assist at home and daughter present this session and helping appropriately.  Will continue caregiver education and mobility training during acute stay.   Follow Up Recommendations  Home health PT;Supervision/Assistance - 24 hour ([facility] aide versus Hospice)     Equipment Recommendations  None recommended by PT    Recommendations for Other Services       Precautions / Restrictions Precautions Precautions: Fall Precaution Comments: PEG, fall with L LE fx and repair on 08/06/15 Restrictions LLE Weight Bearing: Partial weight bearing LLE Partial Weight Bearing Percentage or Pounds: 50%    Mobility  Bed Mobility Overal bed mobility: Needs Assistance Bed Mobility: Sit to Supine   Sit to supine: +2 for physical assistance;Total assist   General bed mobility comments: assisted for trunk to supine, daughter assisted with legs into bed  Transfers Overall transfer level: Needs assistance Equipment used: None Transfers: Squat Pivot Transfers     Squat pivot transfers: +2 physical assistance;Total assist     General transfer comment: daughter assist to lift pt and pivot to bed  Ambulation/Gait                 Stairs            Wheelchair Mobility    Modified Rankin (Stroke Patients Only)       Balance Overall balance  assessment: Needs assistance Sitting-balance support: Feet supported Sitting balance-Leahy Scale: Poor Sitting balance - Comments: minguard at least at EOB due to weakness Initially more assist (mod) then improved during session       Standing balance comment: too weak to stand                    Cognition Arousal/Alertness: Awake/alert Behavior During Therapy: WFL for tasks assessed/performed Overall Cognitive Status: Impaired/Different from baseline                      Exercises    General Comments        Pertinent Vitals/Pain Faces Pain Scale: Hurts a little bit Pain Location: L LE Pain Descriptors / Indicators: Discomfort Pain Intervention(s): Limited activity within patient's tolerance;Monitored during session    Home Living Family/patient expects to be discharged to:: Private residence Living Arrangements: Spouse/significant other;Children Available Help at Discharge: Family;Available 24 hours/day (daughter, sister, son, etc, take turns) Type of Home: House Home Access: Ramped entrance   Home Layout: One level Home Equipment: Wheelchair - [employer] - 2 wheels;Bedside commode;Hospital bed Additional Comments: sponge bathes    Prior Function Level of Independence: Needs assistance    ADL's / Homemaking Assistance Needed: daughter assists with dressing and bathing     PT Goals (current goals can now be found in the care plan section) Acute Rehab PT Goals Patient Stated Goal: to get OOB PT Goal Formulation: With patient/family Time For Goal Achievement: 10/28/15 Potential to Achieve Goals: Fair Progress  towards PT goals: Progressing toward goals    Frequency    Min 3X/week      PT Plan Current plan remains appropriate    Co-evaluation             End of Session Equipment Utilized During Treatment: Gait belt Activity Tolerance: Patient limited by fatigue Patient left: in bed;with call bell/phone within reach;with  family/visitor present     Time: 1540-1550 PT Time Calculation (min) (ACUTE ONLY): 10 min  Charges:  $Therapeutic Activity: 8-22 mins                    G Codes:      Reginia Naas November 05, 2015, 4:39 PM  Magda Kiel, [place] 2015/11/05

## 2015-10-15 ENCOUNTER — Inpatient Hospital Stay (HOSPITAL_COMMUNITY): Payer: Medicaid Other

## 2015-10-15 DIAGNOSIS — D62 Acute posthemorrhagic anemia: Secondary | ICD-10-CM

## 2015-10-15 DIAGNOSIS — D72829 Elevated white blood cell count, unspecified: Secondary | ICD-10-CM

## 2015-10-15 LAB — CBC
HEMATOCRIT: 23.2 % — AB (ref 36.0–46.0)
Hemoglobin: 7 g/dL — ABNORMAL LOW (ref 12.0–15.0)
MCH: 28.9 pg (ref 26.0–34.0)
MCHC: 30.2 g/dL (ref 30.0–36.0)
MCV: 95.9 fL (ref 78.0–100.0)
PLATELETS: 77 10*3/uL — AB (ref 150–400)
RBC: 2.42 MIL/uL — ABNORMAL LOW (ref 3.87–5.11)
RDW: 18.3 % — AB (ref 11.5–15.5)
WBC: 12.4 10*3/uL — ABNORMAL HIGH (ref 4.0–10.5)

## 2015-10-15 LAB — BASIC METABOLIC PANEL
Anion gap: 13 (ref 5–15)
BUN: 102 mg/dL — AB (ref 6–20)
CALCIUM: 8.6 mg/dL — AB (ref 8.9–10.3)
CO2: 27 mmol/L (ref 22–32)
CREATININE: 1.73 mg/dL — AB (ref 0.44–1.00)
Chloride: 111 mmol/L (ref 101–111)
GFR calc Af Amer: 36 mL/min — ABNORMAL LOW (ref >60)
GFR, EST NON AFRICAN AMERICAN: 31 mL/min — AB (ref >60)
GLUCOSE: 134 mg/dL — AB (ref 65–99)
Potassium: 4.1 mmol/L (ref 3.5–5.1)
Sodium: 151 mmol/L — ABNORMAL HIGH (ref 135–145)

## 2015-10-15 LAB — GLUCOSE, CAPILLARY
GLUCOSE-CAPILLARY: 161 mg/dL — AB (ref 65–99)
GLUCOSE-CAPILLARY: 200 mg/dL — AB (ref 65–99)
GLUCOSE-CAPILLARY: 184 mg/dL — AB (ref 65–99)
Glucose-Capillary: 141 mg/dL — ABNORMAL HIGH (ref 65–99)
Glucose-Capillary: 203 mg/dL — ABNORMAL HIGH (ref 65–99)

## 2015-10-15 MED ORDER — FUROSEMIDE 10 MG/ML IJ SOLN
20.0000 mg | Freq: Once | INTRAMUSCULAR | Status: AC
  Administered 2015-10-15: 20 mg via INTRAVENOUS
  Filled 2015-10-15: qty 4

## 2015-10-15 MED ORDER — MORPHINE SULFATE (PF) 2 MG/ML IV SOLN
2.0000 mg | INTRAVENOUS | Status: DC | PRN
  Administered 2015-10-15 – 2015-10-16 (×6): 2 mg via INTRAVENOUS
  Filled 2015-10-15 (×6): qty 1

## 2015-10-15 NOTE — Progress Notes (Signed)
Responded to consult for end of life. Many family members were present and patient's husband requested prayer. Offered spiritual/ emotional support to patient and family. Chaplain available for follow-up.

## 2015-10-15 NOTE — Care Management Note (Signed)
Case Management Note  Patient Details  Name: Megan Sims MRN: [id number] Date of Birth: November 01, 1955  Subjective/Objective:                    Action/Plan: Pt having some SOB today. Orders placed for [facility] services. CM will continue to follow the patient and address [facility] needs when patient is medically ready for discharge.   Expected Discharge Date:                  Expected Discharge Plan:  Skilled Nursing Facility  In-House Referral:  Clinical Social Work  Discharge planning Services  CM Consult  Post Acute Care Choice:    Choice offered to:     DME Arranged:    DME Agency:     HH Arranged:    [place] Agency:     Status of Service:  In process, will continue to follow  If discussed at Long Length of Stay Meetings, dates discussed:    Additional Comments:  Pollie Friar, RN 10/15/2015, 3:25 PM

## 2015-10-15 NOTE — Progress Notes (Signed)
Late Entry: Nurse tech notified nurse of patient sats dropping into 70's. Nurse went to bedside immediately. Pt still short of breath and having difficulty breathing. PRN Morphine, scheduled Lasix and Prednisone had been given prior to this incident. Pt placed on NRB and sat up more in bed. Sats slowly reached 90's and pt stated she felt somewhat better. Hospitalist at bedside for rounds at the same time. Pt refused nasal suctioning via respiratory. Dinamap sat up in the room and oxygen sats stable at this time. Will continue to monitor pt closely. Leanne Chang, RN

## 2015-10-15 NOTE — Progress Notes (Signed)
PT Cancellation Note  Patient Details Name: CHIVONNE RASCON MRN: [id number] DOB: [birth year]   Cancelled Treatment:    Reason Eval/Treat Not Completed: Medical issues which prohibited therapy; RN reports patient with respiratory issues and to cancel for today.  Will check back another day.   Reginia Naas 10/15/2015, 2:16 PM  Magda Kiel, [place] 10/15/2015

## 2015-10-15 NOTE — Progress Notes (Addendum)
Pt politely refused many medications today asking that they not be given right now and hopes that she will not be sent home on many medications at discharge. Wendee Copp

## 2015-10-15 NOTE — Progress Notes (Addendum)
Patient ID: Megan Sims, female   DOB: 12-17-1955, 60 y.o.   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 Patient presented 10/03/2015 to [facility] with hypovolemic shock secondary to GI bleed. She was seen by GI in consultation with multiple EGDs with intervention. Her hospital course was complicated with healthcare associated pneumonia with negative cultures. She was extubated 10/09/2015 but re-intubated 10/11/2015 due to inability to handle secretions and weakness. Finally extubated 10/12/2015 at which time patient changed her CODE STATUS to DNR/DNI.  Assessment & Plan:  Acute hypoxemic respiratory failure secondary to healthcare associated pneumonia / Leukocytosis  - Please refer to anti-infectives dissection for antibiotics received during this hospital stay - More hypoxic this morning with oxygen saturation in 70s but with Ventimask it improved to 94% - Chest x-ray this morning showed a right upper lobe and left lower lobe airspace opacity slightly worse on the left since prior study - Continue oxygen support via nasal cannula or Ventimask to keep oxygen saturation above 90% - Continue Pulmicort nebulizer twice daily - Continue DuoNeb 3 times daily scheduled - Appreciate palliative care following and addressing goals of care  Stage III lung cancer - No inpatient therapy planned  Acute renal failure - Secondary to GI bleeding - Creatinine steadily improving, 1.73 this morning  Hypokalemia - Likely from nebulizer treatments - Potassium now within normal limits  Acute GI bleeding due to 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 Tagged NM scan neg 9/16.  - Continue protonix BID in setting of chronic steroids - Hemoglobin is 7 this morning  Dysphagia on Chronic PEG home - Continue tube feeding Vital AF '@50cc'$ /hr  Dermatomyositis - Chornic, stable, on prednisone.   DVT prophylaxis: SCDs bilaterally Code Status: DNR/DNI  Family Communication: No family at the bedside Disposition Plan: Home with home health PT once respiratory status more stable   Consultants:   GI, Dr. Havery Moros  Procedures:   Echo 9/9: EF 42-35%, diastolic dysfunction  Tagged RBC scan 9/15: Negative  R IJ placed 9/9 >  Antimicrobials:   Vanco 9/8 > 9/11  Cefepime 9/8 - 9/15  Vanoc 9/17 >9/18  Merrem 9/17 > 9/18   Subjective: Currently on Ventimask, was slightly hypoxic this morning but saturation improved to 94%.  Objective: Vitals:   10/15/15 0755 10/15/15 0854 10/15/15 0855 10/15/15 0900  BP:  (!) 125/54  (!) 146/58  Pulse:  (!) 116 (!) 115 (!) 112  Resp:  (!) 22 (!) 22 (!) 22  Temp:  98.3 F (36.8 C)  98.5 F (36.9 C)  TempSrc:  Axillary  Axillary  SpO2: 95% (!) 76% 93% 100%  Weight:      Height:        Intake/Output Summary (Last 24 hours) at 10/15/15 1003 Last data filed at 10/15/15 0539  Gross per 24 hour  Intake                0 ml  Output             1400 ml  Net            -1400 ml  Filed Weights   10/12/15 0200 10/13/15 0500 10/14/15 0230  Weight: 61.5 kg (135 lb 9.3 oz) 60.2 kg (132 lb 11.5 oz) 61.1 kg (134 lb 11.2 oz)    Examination:  General exam: Appears calm and comfortable  Respiratory system: Diminished breath sounds, no wheezing Cardiovascular system: S1 & S2 heard, tachycardic, no edema Gastrointestinal system: Abdomen is nondistended, soft and nontender. No organomegaly or masses felt. Normal bowel sounds heard. Central nervous system: Alert and oriented. No focal neurological deficits. Extremities: Symmetric 5 x 5 power. Pulses palpable Skin: No  rashes, lesions or ulcers Psychiatry: Judgement and insight appear normal. Mood & affect appropriate.   Data Reviewed: I have personally reviewed following labs and imaging studies  CBC:  Recent Labs Lab 10/11/15 1714 10/12/15 0438 10/13/15 0536 10/14/15 0206 10/15/15 0458  WBC 15.0* 14.3* 13.2* 13.1* 12.4*  HGB 7.6* 7.6* 7.8* 7.0* 7.0*  HCT 24.4* 24.9* 25.1* 22.6* 23.2*  MCV 93.8 94.0 93.0 94.2 95.9  PLT 82* 89* 85* 67* 77*   Basic Metabolic Panel:  Recent Labs Lab 10/08/15 1825  10/10/15 0331  10/11/15 1751 10/12/15 0438 10/13/15 0536 10/14/15 0206 10/15/15 0458  NA  --   < > 151*  < > 141 143 145 147* 151*  K 3.7  < > 3.9  < > 3.3* 3.3* 3.1* 3.8 4.1  CL  --   < > 118*  < > 110 111 113* 113* 111  CO2  --   < > 22  < > '22 22 25 26 27  '$ GLUCOSE  --   < > 150*  < > 192* 206* 144* 132* 134*  BUN  --   < > 78*  < > 73* 80* 87* 94* 102*  CREATININE  --   < > 2.22*  < > 2.04* 2.00* 1.87* 1.82* 1.73*  CALCIUM  --   < > 8.6*  < > 8.1* 8.3* 8.5* 8.3* 8.6*  MG 1.7  --  2.3  --   --   --   --   --   --   PHOS 4.4  --   --   --   --   --   --   --   --   < > = values in this interval not displayed. GFR: Estimated Creatinine Clearance: 29 mL/min (by C-G formula based on SCr of 1.73 mg/dL (H)). Liver Function Tests: No results for input(s): AST, ALT, ALKPHOS, BILITOT, PROT, ALBUMIN in the last 168 hours. No results for input(s): LIPASE, AMYLASE in the last 168 hours. No results for input(s): AMMONIA in the last 168 hours. Coagulation Profile:  Recent Labs Lab 10/08/15 1358 10/10/15 1610  INR 1.35 1.23   Cardiac Enzymes:  Recent Labs Lab 10/10/15 1430  CKTOTAL 319*   BNP (last 3 results) No results for input(s): PROBNP in the last 8760 hours. HbA1C: No results for input(s): HGBA1C in the last 72 hours. CBG:  Recent Labs Lab 10/14/15 1619 10/14/15 1950 10/14/15 2357 10/15/15 0445 10/15/15 0720  GLUCAP 240* 212* 132* 141* 161*   Lipid Profile: No results  for input(s): CHOL, HDL, LDLCALC, TRIG, CHOLHDL, LDLDIRECT in the last 72 hours. Thyroid Function Tests: No results for input(s): TSH, T4TOTAL, FREET4, T3FREE, THYROIDAB in the last 72 hours. Anemia Panel: No results for input(s): VITAMINB12, FOLATE, FERRITIN, TIBC, IRON, RETICCTPCT in the last 72 hours. Urine analysis:    Component Value Date/Time   COLORURINE YELLOW 10/02/2015 1732   APPEARANCEUR CLOUDY (A) 10/02/2015  1732   LABSPEC 1.017 10/02/2015 1732   PHURINE 6.0 10/02/2015 1732   GLUCOSEU 100 (A) 10/02/2015 1732   HGBUR MODERATE (A) 10/02/2015 1732   BILIRUBINUR NEGATIVE 10/02/2015 1732   KETONESUR NEGATIVE 10/02/2015 1732   PROTEINUR 30 (A) 10/02/2015 1732   UROBILINOGEN 0.2 10/25/2014 0021   NITRITE NEGATIVE 10/02/2015 1732   LEUKOCYTESUR NEGATIVE 10/02/2015 1732   Sepsis Labs: '@LABRCNTIP'$ (procalcitonin:4,lacticidven:4)   Recent Results (from the past 240 hour(s))  Culture, respiratory (NON-Expectorated)     Status: None   Collection Time: 10/10/15 12:06 PM  Result Value Ref Range Status   Specimen Description TRACHEAL ASPIRATE  Final   Special Requests NONE  Final   Gram Stain   Final    ABUNDANT WBC PRESENT, PREDOMINANTLY PMN FEW SQUAMOUS EPITHELIAL CELLS PRESENT FEW GRAM NEGATIVE RODS RARE GRAM POSITIVE COCCI IN CLUSTERS RARE GRAM NEGATIVE COCCI IN PAIRS    Culture Consistent with normal respiratory flora.  Final   Report Status 10/13/2015 FINAL  Final      Radiology Studies: Dg Chest Port 1 View Result Date: 10/15/2015 Right upper lobe and left lower lobe airspace opacities, slightly worsened on the left since prior study.   Dg Chest Port 1 View Result Date: 10/12/2015 Continued right upper lobe consolidation. New airspace disease/ consolidation in the left lower lobe. Findings compatible with multifocal pneumonia. Electronically Signed   By: Rolm Baptise M.D.   On: 10/12/2015 07:42      Scheduled Meds: . budesonide (PULMICORT) nebulizer solution   0.5 mg Nebulization BID  . furosemide  20 mg Intravenous Daily  . insulin aspart  0-15 Units Subcutaneous Q4H  . ipratropium-albuterol  3 mL Nebulization TID  . multivitamin  15 mL Per Tube Daily  . pantoprazole  40 mg Intravenous Q12H  . predniSONE  10 mg Oral Q breakfast   Continuous Infusions: . sodium chloride Stopped (10/13/15 1000)  . feeding supplement (JEVITY 1.2 CAL) 1,000 mL (10/14/15 2038)     LOS: 14 days    Time spent: 25 minutes  Greater than 50% of the time spent on counseling and coordinating the care.   Leisa Lenz, MD [facility] Pager [phone]  If 7PM-7AM, please contact night-coverage [email or web address] Password [facility] 10/15/2015, 10:03 AM

## 2015-10-15 NOTE — Progress Notes (Signed)
Daily Progress Note   Patient Name: Megan Sims       Date: 10/15/2015 DOB: 1955-10-23  Age: 60 y.o. MRN#: [id number] Attending Physician: Robbie Lis, MD Primary Care Physician: Jani Gravel, MD Admit Date: 10/17/2015  Reason for Consultation/Follow-up: Establishing goals of care  Subjective: 09:30-Upon arrival to room, patient sitting up in bed with tachypnea and labored breathing. Spoke with RN, who recently placed NRB mask, and gave lasix and morphine. Patient drowsy with minimal speech, but tells me she is tired. Portable CXR ordered-RUL and LLL airspace opacities, slightly worsened on the left since prior study.   Dr. Rowe Pavy and I spoke with husband, Denyse Amass, and son, [name] at bedside. Discussed the event this morning with desaturation and that she is requiring high oxygen demand. Discussed despite antibiotics and aggressive treatment this hospitalization, her lungs are failing. Discussed how at this point, she has many chronic conditions that are irreversible and a switch to comfort should be the focus. Denyse Amass and [name] calling the family and updating. Husband requested I call and update Rhae Lerner, EMT with [facility] that has been a big support to the family at home.   Length of Stay: 14  Current Medications: Scheduled Meds:  . budesonide (PULMICORT) nebulizer solution  0.5 mg Nebulization BID  . furosemide  20 mg Intravenous Daily  . furosemide  20 mg Intravenous Once  . insulin aspart  0-15 Units Subcutaneous Q4H  . ipratropium-albuterol  3 mL Nebulization TID  . multivitamin  15 mL Per Tube Daily  . pantoprazole  40 mg Intravenous Q12H  . predniSONE  10 mg Oral Q breakfast    Continuous Infusions: . sodium chloride Stopped (10/13/15 1000)  . feeding supplement  (JEVITY 1.2 CAL) 1,000 mL (10/14/15 2038)    PRN Meds: morphine injection, morphine CONCENTRATE, sodium chloride flush, white petrolatum  Physical Exam  Constitutional: She is cooperative. She appears distressed.  Eyes: Pupils are equal, round, and reactive to light.  Cardiovascular: Regular rhythm and normal heart sounds.   Pulmonary/Chest: Tachypnea noted. She has rhonchi.  NRB mask recently placed  Abdominal: Soft. Bowel sounds are normal.  Musculoskeletal: She exhibits edema (generalized, BLE).  Neurological: She is alert.  Does not answer orientation questions  Skin: Skin is warm and dry.  Nursing note and vitals reviewed.  Vital Signs: BP (!) 157/74 (BP Location: Right Arm)   Pulse (!) 126   Temp 98.9 F (37.2 C) (Axillary)   Resp (!) 22   Ht '5\' 1"'$  (1.549 m)   Wt 61.1 kg (134 lb 11.2 oz)   SpO2 96%   BMI 25.45 kg/m  SpO2: SpO2: 96 % O2 Device: O2 Device: NRB O2 Flow Rate: O2 Flow Rate (L/min): 15 L/min  Intake/output summary:   Intake/Output Summary (Last 24 hours) at 10/15/15 1535 Last data filed at 10/15/15 0815  Gross per 24 hour  Intake                0 ml  Output             1400 ml  Net            -1400 ml   LBM: Last BM Date: 10/13/15 Baseline Weight: Weight: 57.6 kg (127 lb) Most recent weight: Weight: 61.1 kg (134 lb 11.2 oz)       Palliative Assessment/Data: PPS 20%   Flowsheet Rows   Flowsheet Row Most Recent Value  Intake Tab  Referral Department  Critical care  Unit at Time of Referral  ICU  Palliative Care Primary Diagnosis  Pulmonary  Date Notified  10/13/15  Palliative Care Type  New Palliative care  Reason for referral  Clarify Goals of Care, Counsel Regarding Hospice  Date of Admission  09/30/2015  Date first seen by Palliative Care  10/13/15  # of days Palliative referral response time  0 Day(s)  # of days IP prior to Palliative referral  12  Clinical Assessment  Palliative Performance Scale Score  20%  Psychosocial &  Spiritual Assessment  Palliative Care Outcomes  Patient/Family meeting held?  Yes  Who was at the meeting?  husband and son  Palliative Care Outcomes  Improved non-pain symptom therapy, Provided advance care planning, Provided psychosocial or spiritual support      Patient Active Problem List   Diagnosis Date Noted  . Palliative care encounter   . Goals of care, counseling/discussion   . Encounter for hospice care discussion   . Acute respiratory failure ([place])   . UGIB (upper gastrointestinal bleed)   . Hemorrhagic shock 10/02/2015  . Pressure ulcer 09/29/2015  . Duodenal ulcer hemorrhagic   . Acute GI bleeding 09/28/2015  . HCAP (healthcare-associated pneumonia) 09/08/2015  . Diarrhea 09/02/2015  . Closed fracture of left femur ([place])   . Upper GI bleed   . Acute blood loss anemia   . Fall from slip, trip, or stumble 08/07/2015  . Left femoral shaft fracture ([place]) 08/07/2015  . 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 S/P gastrostomy ([place]) 08/04/2015  . Deficiency anemia 08/03/2015  . Dehydration 07/02/2015  . Candida esophagitis ([place])   . Anemia due to other cause   . Mucosal abnormality of stomach   . Pill esophagitis due to tetracycline 05/24/2015  . UTI (lower urinary tract infection) 05/24/2015  . Emphysema of lung ([place]) 05/05/2015  . Rash, skin 05/05/2015  . Severe sepsis ([place]) 04/10/2015  . AKI (acute kidney injury) ([place]) 04/10/2015  . Hyponatremia 04/10/2015  . Sepsis due to pneumonia ([place])   . CAP (community acquired pneumonia) 04/09/2015  . Skin ulcer ([place]) 11/18/2014  . Musculoskeletal chest pain 10/25/2014  . Pain in the chest   . Tachycardia   . Anemia due to antineoplastic chemotherapy 10/21/2014  . Cancer of upper lobe of right lung ([place]) 10/07/2014  .  Oral thrush 09/24/2014  . Nicotine dependence 09/24/2014  . Chronic neck pain 09/24/2014  . Excessive weight loss 09/24/2014  . 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 Bilateral lung cancer ([place])   . Paget's disease of bone   . Dysphagia 09/05/2014  . Essential hypertension 09/05/2014    Palliative Care Assessment & Plan   Patient Profile: 60 y.o. female  with past medical history of lung cancer with chemo and radiation in Fall 2016, dermatomyositis, prolonged steriod use,  CHF, steven-johnson disease, hypertension, emphysema, cerebral aneurysm, anemia, dysphagia with PEG placement in May, and recent admit for GI bleed due to duodenal ulcer admitted on 09/24/2015 with respiratory distress, increased mucus production in the setting of chronic hemoptysis, and generalized weakness. Recent admit from 9/5-9/7 with GI bleed and HCAP, sent home on Levaquin. Hemoglobin 8.4 on admit. Patient sent to stepdown and unfortunately went unresponsive-code called and patient intubated and transferred to ICU. At that time, hemoglobin dropped to 3.5 and patient was urgently transfused 3 units PRBC. Extubated on 9/16 but required reintubation on 9/17. Patient then extubated 9/19 and there have been extensive conversations with patient and family regarding code status. Patient does not want to be re-intubated or resuscitated. DNR. GI consulted and closely following. Patient has had 2 EGD's this hospitalization. 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 Tagged RBC scan negative. Unclear of mild bleeding episode location but stable for now and GI continuing to follow. Patient receiving protonix BID. Palliative Medicine consultation for goals of care and possible hospice candidate.   Assessment: Acute hypoxemic respiratory failure HCAP Stage III lung cancer Acute renal failure Anemia Duodenal ulcer/dieulafoy lesion Dysphagia--chronic PEG  Recommendations/Plan:  Portable CXR ordered--last CXR on 9/17. Patient in respiratory distress/ ? Fluid overload. Lasix '20mg'$  IV given x1  Morphine '2mg'$  IV q2h prn pain/dysnea/air hunger  Consult to spiritual  care. Family requesting prayer. Patient may be at end of life.   Discontinued CBG's.   Continue tube feeds for now.   We will see how she does through the night. PMT will f/u 9/23. May need to transition to comfort care if patient continues to decline.   Code Status: DNR   Code Status Orders        Start     Ordered   10/12/15 2409  Do not attempt resuscitation (DNR)  Continuous    Question Answer Comment  In the event of cardiac or respiratory ARREST Do not call a "code blue"   In the event of cardiac or respiratory ARREST Do not perform Intubation, CPR, defibrillation or ACLS   In the event of cardiac or respiratory ARREST Use medication by any route, position, wound care, and other measures to relive pain and suffering. May use oxygen, suction and manual treatment of airway obstruction as needed for comfort.      10/12/15 1638    Code Status History    Date Active Date Inactive Code Status Order ID Comments User Context   10/07/2015  2:26 PM 10/12/2015  4:38 PM Full Code [id number]  Rush Landmark, MD Inpatient   10/02/2015  1:41 PM 10/07/2015  2:26 PM DNR [id number]  Rush Landmark, MD Inpatient   10/02/2015  8:50 AM 10/02/2015  1:41 PM Full Code [id number]  Rush Landmark, MD Inpatient   10/19/2015  2:37 PM 10/02/2015  8:50 AM Full Code [id number]  Rondel Jumbo, PA-C ED   09/28/2015  9:49 PM 09/30/2015  5:29 PM Full Code [id number]  Rise Patience, MD Inpatient   09/08/2015  3:06 PM 09/10/2015  5:26 PM Full Code [id number]  Donne Hazel, MD ED   08/07/2015  3:00 AM 08/13/2015  8:27 PM Full Code [id number]  Reubin Milan, MD Inpatient   07/02/2015 12:32 PM 07/08/2015  7:14 PM Full Code [id number]  Samuella Cota, MD Inpatient   06/15/2015  1:50 AM 06/17/2015  1:31 PM Full Code [id number]  Phillips Grout, MD Inpatient   05/25/2015 12:06 AM 06/01/2015 12:58 PM Full Code [id number]  Orvan Falconer, MD Inpatient   04/10/2015  3:34 AM 04/13/2015 12:32 PM Full Code [id number]  Vianne Bulls,  MD Inpatient   10/25/2014  4:40 AM 10/28/2014  3:14 PM Full Code [id number]  Phillips Grout, MD Inpatient   09/05/2014  7:19 PM 09/12/2014  2:59 PM Full Code [id number]  Waldemar Dickens, MD ED    Advance Directive Documentation   Flowsheet Row Most Recent Value  Type of Advance Directive  Healthcare Power of Attorney  Pre-existing out of facility DNR order (yellow form or pink MOST form)  No data  "MOST" Form in Place?  No data       Prognosis:   Unable to determine-Guarded. Hypoxic respiratory event this AM. Patient is now on NRB.  Discharge Planning:  To Be Determined   Care plan was discussed with Dr. Rowe Pavy, RN, husband, and son  Thank you for allowing the Palliative Medicine Team to assist in the care of this patient.   Time In: 1430 Time Out: 1505 Total Time 77mn Prolonged Time Billed  no       Greater than 50%  of this time was spent counseling and coordinating care related to the above assessment and plan.  MIhor Dow FNP-C Palliative Medicine Team  Phone: [phone]: [phone] ZLoistine ChanceMD [phone]  Patient seen and examined Agree with above note Detailed family meeting with husband and son  Please contact Palliative Medicine Team phone at [phone] questions and concerns.

## 2015-10-16 DIAGNOSIS — E870 Hyperosmolality and hypernatremia: Secondary | ICD-10-CM

## 2015-10-16 LAB — BASIC METABOLIC PANEL
Anion gap: 9 (ref 5–15)
BUN: 110 mg/dL — AB (ref 6–20)
CHLORIDE: 113 mmol/L — AB (ref 101–111)
CO2: 33 mmol/L — AB (ref 22–32)
CREATININE: 1.7 mg/dL — AB (ref 0.44–1.00)
Calcium: 8.4 mg/dL — ABNORMAL LOW (ref 8.9–10.3)
GFR calc non Af Amer: 32 mL/min — ABNORMAL LOW (ref >60)
GFR, EST AFRICAN AMERICAN: 37 mL/min — AB (ref >60)
Glucose, Bld: 290 mg/dL — ABNORMAL HIGH (ref 65–99)
POTASSIUM: 4.7 mmol/L (ref 3.5–5.1)
SODIUM: 155 mmol/L — AB (ref 135–145)

## 2015-10-16 MED ORDER — ACETAMINOPHEN 650 MG RE SUPP
650.0000 mg | Freq: Once | RECTAL | Status: AC
  Administered 2015-10-16: 650 mg via RECTAL
  Filled 2015-10-16: qty 1

## 2015-10-16 MED ORDER — SODIUM CHLORIDE 0.9 % IV BOLUS (SEPSIS): 500.0000 mL | Freq: Once | INTRAVENOUS | Status: DC

## 2015-10-16 MED ORDER — SODIUM CHLORIDE 0.9 % IV SOLN
2.0000 mg/h | INTRAVENOUS | Status: DC
  Administered 2015-10-16: 2 mg/h via INTRAVENOUS
  Filled 2015-10-16: qty 10

## 2015-10-16 MED ORDER — MORPHINE BOLUS VIA INFUSION
1.0000 mg | INTRAVENOUS | Status: DC | PRN
  Filled 2015-10-16: qty 1

## 2015-10-16 MED ORDER — ACETAMINOPHEN 325 MG PO TABS: 650.0000 mg | ORAL_TABLET | Freq: Once | ORAL | Status: DC

## 2015-10-16 MED ORDER — FREE WATER
200.0000 mL | Freq: Four times a day (QID) | Status: DC
  Administered 2015-10-16 (×3): 200 mL

## 2015-10-16 MED ORDER — ACETAMINOPHEN 650 MG RE SUPP
650.0000 mg | RECTAL | Status: DC | PRN
  Filled 2015-10-16: qty 1

## 2015-10-17 NOTE — Progress Notes (Signed)
Patient  Passed at 04/05/28. Patient's nurse and the charge nurse  Certified the death at Apr 05, 2328, at which time patient had no pulse and no breath. Chaplain called to speak with the family.

## 2015-10-18 NOTE — Discharge Summary (Signed)
Death Summary  Megan Sims GEZ:[id number] DOB: Jun 14, 1955 DOA: 10/11/15  PCP: Jani Gravel, MD PCP/Office notified  Admit date: 10/11/2015 Date of Death: Oct 28, 2015  Final Diagnoses:  Principal Problem:   Sepsis due to pneumonia [facility]) Active Problems:   Dysphagia   Essential hypertension   Bilateral lung cancer ([place])   Paget's disease of bone   Oral thrush   Nicotine dependence   Chronic neck pain   Cancer of upper lobe of right lung ([place])   Anemia due to other cause   Dehydration   Deficiency anemia   HCAP (healthcare-associated pneumonia)   Duodenal ulcer hemorrhagic   Hemorrhagic shock   UGIB (upper gastrointestinal bleed)   Acute respiratory failure with hypoxemia [facility])   Palliative care encounter   Goals of care, counseling/discussion   Encounter for hospice care discussion   History of present illness:   Hospital Course:  60 -year-old female with past medical history significant for lung cancer, status post chemotherapy and radiation in 2016, dermatomyositis on chronic steroids, recurrent GI bleed. Patient presented 10/11/15 to [facility] with hypovolemic shock secondary to GI bleed. She was seen by GI in consultation with multiple EGDs with intervention. Her hospital course was complicated with healthcare associated pneumonia with negative cultures. She was extubated 10/09/2015 but re-intubated 10/11/2015 due to inability to handle secretions and weakness. Finally extubated 10/12/2015 at which time patient changed her CODE STATUS to DNR/DNI.  Assessment & Plan:  Acute hypoxemic respiratory failure secondary to healthcare associated pneumonia / Leukocytosis  - Please refer to anti-infectives dissection for antibiotics received during this hospital stay - Patient currently not on any antibiotic - She was on Ventimask  - Chest x-ray 9/22 showed a right upper lobe and left lower lobe airspace opacity slightly worse on the left since prior study - Continued  Pulmicort nebulizer twice daily - Continued DuoNeb 3 times daily scheduled - Appreciate palliative care following and addressing goals of care  Stage III lung cancer - No inpatient therapy planned  Acute renal failure - Secondary to GI bleeding  Hypokalemia - Likely from nebulizer treatments  Acute GI bleeding due to duodenal ulcer and dieulafoy lesion in duodenal bulb clipped 9/11 / Acute blood loss anemia / Acute hypovolemic shock  - S/P EGD on 9/6, 9/11, 9/13. Tagged NM scan neg 9/16.  - Continued protonix BID in setting of chronic steroids  Thrombocytopenia - Likely secondary to history of lung cancer  Hypernatremia - Likely due to poor intake, dehydration  Dysphagia on Chronic PEGhome - Was on tube feeding Vital AF '@50cc'$ /hr  Dermatomyositis - Chornic, stable, on prednisone.   DVT prophylaxis: SCDs bilaterally Code Status: DNR/DNI     Consultants:   GI, Dr. Havery Moros  Procedures:   Echo 9/9: EF 65-03%, diastolic dysfunction  Tagged RBC scan 9/15: Negative  R IJ placed 9/9 >  Antimicrobials:   Vanco October 11, 2022 > 9/11  Cefepime 2022-10-11 - 9/15  Vanoc 9/17 >9/18  Merrem 9/17 >9/18   Time: 10/17/2015 23:30 pm   Signed:  Leisa Lenz  [facility] 2015/10/28, 11:16 AM

## 2015-10-24 NOTE — Progress Notes (Signed)
Notified on-call of temp 101.8, new order received and tylenol pr given.  Patient also requiring q2hr IV Morphine to assist with difficulty breathing.  Non-rebreather mask on, consulted Respiratory throughout night.  Please consider continuous gtt for pain management & dyspnea. Thanks!

## 2015-10-24 NOTE — Progress Notes (Signed)
Patient ID: Megan Sims, female   DOB: 01/16/1956, 60 y.o.   MRN: [id number]  PROGRESS NOTE    Megan Sims  FVC:[id number] DOB: 1955/04/29 DOA: 10/09/2015  PCP: Jani Gravel, MD   Brief Narrative:  26 -year-old female with past medical history significant for lung cancer, status post chemotherapy and radiation in 2016, dermatomyositis on chronic steroids, recurrent GI bleed. Patient presented 10/10/2015 to [facility] with hypovolemic shock secondary to GI bleed. She was seen by GI in consultation with multiple EGDs with intervention. Her hospital course was complicated with healthcare associated pneumonia with negative cultures. She was extubated 10/09/2015 but re-intubated 10/11/2015 due to inability to handle secretions and weakness. Finally extubated 10/12/2015 at which time patient changed her CODE STATUS to DNR/DNI.  Assessment & Plan:  Acute hypoxemic respiratory failure secondary to healthcare associated pneumonia / Leukocytosis  - Please refer to anti-infectives dissection for antibiotics received during this hospital stay - Patient currently not on any antibiotic - She is on a Ventimask this morning and oxygen saturation is 94 - 100% - Chest x-ray 9/22 showed a right upper lobe and left lower lobe airspace opacity slightly worse on the left since prior study - Continue Pulmicort nebulizer twice daily - Continue DuoNeb 3 times daily scheduled - Appreciate palliative care following and addressing goals of care  Stage III lung cancer - No inpatient therapy planned  Acute renal failure - Secondary to GI bleeding - Follow-up BMP this morning  Hypokalemia - Likely from nebulizer treatments - Follow-up BMP this morning  Acute GI bleeding due to duodenal ulcer and dieulafoy lesion in duodenal bulb clipped 9/11 / Acute blood loss anemia / Acute hypovolemic shock  - S/P EGD on 9/6, 9/11, 9/13. Tagged NM scan neg 9/16.  - Continue protonix BID in setting of chronic steroids -  Follow up CBC today   Thrombocytopenia - Likely secondary to history of lung cancer - Platelet count 77 yesterday and we'll follow-up CBC this morning  Hypernatremia - Likely due to poor intake, dehydration - We'll see if we can add more water flushes with tube feeds  Dysphagia on Chronic PEG home - Continue tube feeding Vital AF '@50cc'$ /hr  Dermatomyositis - Chornic, stable, on prednisone.   DVT prophylaxis: SCDs bilaterally Code Status: DNR/DNI  Family Communication: Husband and daughter at the bedside Disposition Plan: Home with home health PT once respiratory status more stable   Consultants:   GI, Dr. Havery Moros  Procedures:   Echo 9/9: EF 63-84%, diastolic dysfunction  Tagged RBC scan 9/15: Negative  R IJ placed 9/9 >  Antimicrobials:   Vanco 9/8 > 9/11  Cefepime 9/8 - 9/15  Vanoc 9/17 >9/18  Merrem 9/17 > 9/18   Subjective: Currently on Ventimask. Spiked fever last night.  Objective: Vitals:   11-15-15 0456 15-Nov-2015 0543 November 15, 2015 0755 11-15-15 0919  BP: (!) 118/53   (!) 108/36  Pulse: (!) 126   (!) 119  Resp: 18   16  Temp: (!) 101.8 F (38.8 C) 99.2 F (37.3 C)  99 F (37.2 C)  TempSrc: Axillary Axillary  Axillary  SpO2: 96%  100% 94%  Weight:      Height:        Intake/Output Summary (Last 24 hours) at November 15, 2015 0925 Last data filed at November 15, 2015 0700  Gross per 24 hour  Intake             1120 ml  Output  1700 ml  Net             -580 ml   Filed Weights   10/12/15 0200 10/13/15 0500 10/14/15 0230  Weight: 61.5 kg (135 lb 9.3 oz) 60.2 kg (132 lb 11.5 oz) 61.1 kg (134 lb 11.2 oz)    Examination:  General exam: Appears calm, no distress  Respiratory system: Diminished breath sounds, rhonchorous  Cardiovascular system: S1 & S2 heard, tachycardic Gastrointestinal system: (+ )BS, non tender  Central nervous system: No focal neurological deficits. Extremities: Pulses palpable, (+1-2) LE edema  Skin: warm and dry    Psychiatry: Mood & affect appropriate.   Data Reviewed: I have personally reviewed following labs and imaging studies  CBC:  Recent Labs Lab 10/11/15 1714 10/12/15 0438 10/13/15 0536 10/14/15 0206 10/15/15 0458  WBC 15.0* 14.3* 13.2* 13.1* 12.4*  HGB 7.6* 7.6* 7.8* 7.0* 7.0*  HCT 24.4* 24.9* 25.1* 22.6* 23.2*  MCV 93.8 94.0 93.0 94.2 95.9  PLT 82* 89* 85* 67* 77*   Basic Metabolic Panel:  Recent Labs Lab 10/10/15 0331  10/11/15 1751 10/12/15 0438 10/13/15 0536 10/14/15 0206 10/15/15 0458  NA 151*  < > 141 143 145 147* 151*  K 3.9  < > 3.3* 3.3* 3.1* 3.8 4.1  CL 118*  < > 110 111 113* 113* 111  CO2 22  < > '22 22 25 26 27  '$ GLUCOSE 150*  < > 192* 206* 144* 132* 134*  BUN 78*  < > 73* 80* 87* 94* 102*  CREATININE 2.22*  < > 2.04* 2.00* 1.87* 1.82* 1.73*  CALCIUM 8.6*  < > 8.1* 8.3* 8.5* 8.3* 8.6*  MG 2.3  --   --   --   --   --   --   < > = values in this interval not displayed. GFR: Estimated Creatinine Clearance: 29 mL/min (by C-G formula based on SCr of 1.73 mg/dL (H)). Liver Function Tests: No results for input(s): AST, ALT, ALKPHOS, BILITOT, PROT, ALBUMIN in the last 168 hours. No results for input(s): LIPASE, AMYLASE in the last 168 hours. No results for input(s): AMMONIA in the last 168 hours. Coagulation Profile:  Recent Labs Lab 10/10/15 1610  INR 1.23   Cardiac Enzymes:  Recent Labs Lab 10/10/15 1430  CKTOTAL 319*   BNP (last 3 results) No results for input(s): PROBNP in the last 8760 hours. HbA1C: No results for input(s): HGBA1C in the last 72 hours. CBG:  Recent Labs Lab 10/15/15 0445 10/15/15 0720 10/15/15 1113 10/15/15 1557 10/15/15 2051  GLUCAP 141* 161* 203* 200* 184*   Lipid Profile: No results for input(s): CHOL, HDL, LDLCALC, TRIG, CHOLHDL, LDLDIRECT in the last 72 hours. Thyroid Function Tests: No results for input(s): TSH, T4TOTAL, FREET4, T3FREE, THYROIDAB in the last 72 hours. Anemia Panel: No results for input(s):  VITAMINB12, FOLATE, FERRITIN, TIBC, IRON, RETICCTPCT in the last 72 hours. Urine analysis:    Component Value Date/Time   COLORURINE YELLOW 10/02/2015 1732   APPEARANCEUR CLOUDY (A) 10/02/2015 1732   LABSPEC 1.017 10/02/2015 1732   PHURINE 6.0 10/02/2015 1732   GLUCOSEU 100 (A) 10/02/2015 1732   HGBUR MODERATE (A) 10/02/2015 1732   BILIRUBINUR NEGATIVE 10/02/2015 1732   KETONESUR NEGATIVE 10/02/2015 1732   PROTEINUR 30 (A) 10/02/2015 1732   UROBILINOGEN 0.2 10/25/2014 0021   NITRITE NEGATIVE 10/02/2015 1732   LEUKOCYTESUR NEGATIVE 10/02/2015 1732   Sepsis Labs: '@LABRCNTIP'$ (procalcitonin:4,lacticidven:4)   Recent Results (from the past 240 hour(s))  Culture, respiratory (NON-Expectorated)  Status: None   Collection Time: 10/10/15 12:06 PM  Result Value Ref Range Status   Specimen Description TRACHEAL ASPIRATE  Final   Special Requests NONE  Final   Gram Stain   Final    ABUNDANT WBC PRESENT, PREDOMINANTLY PMN FEW SQUAMOUS EPITHELIAL CELLS PRESENT FEW GRAM NEGATIVE RODS RARE GRAM POSITIVE COCCI IN CLUSTERS RARE GRAM NEGATIVE COCCI IN PAIRS    Culture Consistent with normal respiratory flora.  Final   Report Status 10/13/2015 FINAL  Final      Radiology Studies: Dg Chest Port 1 View Result Date: 10/15/2015 Right upper lobe and left lower lobe airspace opacities, slightly worsened on the left since prior study.   Dg Chest Port 1 View Result Date: 10/12/2015 Continued right upper lobe consolidation. New airspace disease/ consolidation in the left lower lobe. Findings compatible with multifocal pneumonia. Electronically Signed   By: Rolm Baptise M.D.   On: 10/12/2015 07:42      Scheduled Meds: . budesonide (PULMICORT) nebulizer solution  0.5 mg Nebulization BID  . furosemide  20 mg Intravenous Daily  . ipratropium-albuterol  3 mL Nebulization TID  . multivitamin  15 mL Per Tube Daily  . pantoprazole  40 mg Intravenous Q12H  . predniSONE  10 mg Oral Q breakfast    Continuous Infusions: . sodium chloride Stopped (10/13/15 1000)  . feeding supplement (JEVITY 1.2 CAL) 1,000 mL (10/14/15 2038)     LOS: 15 days    Time spent: 25 minutes  Greater than 50% of the time spent on counseling and coordinating the care.   Leisa Lenz, MD [facility] Pager [phone]  If 7PM-7AM, please contact night-coverage [email or web address] Password [facility] 10/19/2015, 9:25 AM

## 2015-10-24 NOTE — Progress Notes (Signed)
Daily Progress Note   Patient Name: Megan Sims       Date: 11-Nov-2015 DOB: 04-Feb-1955  Age: 60 y.o. MRN#: [id number] Attending Physician: Robbie Lis, MD Primary Care Physician: Jani Gravel, MD Admit Date: 10/20/2015  Reason for Consultation/Follow-up: end of life care  Subjective: Opens eyes, appears with shallow breathing In no distress Nearly unresponsive See below  Length of Stay: 15  Current Medications: Scheduled Meds:  . budesonide (PULMICORT) nebulizer solution  0.5 mg Nebulization BID  . free water  200 mL Per Tube Q6H  . furosemide  20 mg Intravenous Daily  . ipratropium-albuterol  3 mL Nebulization TID  . multivitamin  15 mL Per Tube Daily  . pantoprazole  40 mg Intravenous Q12H  . predniSONE  10 mg Oral Q breakfast    Continuous Infusions: . sodium chloride Stopped (10/13/15 1000)  . feeding supplement (JEVITY 1.2 CAL) 1,000 mL (2015/11/11 1003)  . morphine      PRN Meds: morphine **AND** morphine, sodium chloride flush, white petrolatum  Physical Exam  Constitutional: She is cooperative. She appears distressed.  Eyes: Pupils are equal, round, and reactive to light.  Cardiovascular: Regular rhythm and normal heart sounds.   Pulmonary/Chest: Tachypnea noted. She has rhonchi.  NRB mask recently placed  Abdominal: Soft. Bowel sounds are normal.  Musculoskeletal: She exhibits edema (generalized, BLE).  Neurological: She is alert.  Does not answer orientation questions  Skin: Skin is warm and dry.  Nursing note and vitals reviewed.           Vital Signs: BP (!) 108/36 (BP Location: Right Arm)   Pulse (!) 119   Temp 99 F (37.2 C) (Axillary)   Resp 16   Ht '5\' 1"'$  (1.549 m)   Wt 61.1 kg (134 lb 11.2 oz)   SpO2 94%   BMI 25.45 kg/m  SpO2: SpO2: 94 % O2  Device: O2 Device: NRB O2 Flow Rate: O2 Flow Rate (L/min): 15 L/min  Intake/output summary:   Intake/Output Summary (Last 24 hours) at 11-11-15 1105 Last data filed at 11/11/2015 0700  Gross per 24 hour  Intake             1120 ml  Output             1700 ml  Net             -  580 ml   LBM: Last BM Date: 10/13/15 Baseline Weight: Weight: 57.6 kg (127 lb) Most recent weight: Weight: 61.1 kg (134 lb 11.2 oz)       Palliative Assessment/Data: PPS 20%   Flowsheet Rows   Flowsheet Row Most Recent Value  Intake Tab  Referral Department  Critical care  Unit at Time of Referral  ICU  Palliative Care Primary Diagnosis  Pulmonary  Date Notified  10/13/15  Palliative Care Type  New Palliative care  Reason for referral  Clarify Goals of Care, Counsel Regarding Hospice  Date of Admission  10/20/2015  Date first seen by Palliative Care  10/13/15  # of days Palliative referral response time  0 Day(s)  # of days IP prior to Palliative referral  12  Clinical Assessment  Palliative Performance Scale Score  20%  Psychosocial & Spiritual Assessment  Palliative Care Outcomes  Patient/Family meeting held?  Yes  Who was at the meeting?  husband and son  Palliative Care Outcomes  Improved non-pain symptom therapy, Provided advance care planning, Provided psychosocial or spiritual support      Patient Active Problem List   Diagnosis Date Noted  . Palliative care encounter   . Goals of care, counseling/discussion   . Encounter for hospice care discussion   . Acute respiratory failure with hypoxemia ([place])   . UGIB (upper gastrointestinal bleed)   . Hemorrhagic shock 10/02/2015  . Pressure ulcer 09/29/2015  . Duodenal ulcer hemorrhagic   . Acute GI bleeding 09/28/2015  . HCAP (healthcare-associated pneumonia) 09/08/2015  . Diarrhea 09/02/2015  . Closed fracture of left femur ([place])   . Upper GI bleed   . Acute blood loss anemia   . Fall from slip, trip, or stumble 08/07/2015  . Left  femoral shaft fracture ([place]) 08/07/2015  . 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 S/P gastrostomy ([place]) 08/04/2015  . Deficiency anemia 08/03/2015  . Dehydration 07/02/2015  . Candida esophagitis ([place])   . Anemia due to other cause   . Mucosal abnormality of stomach   . Pill esophagitis due to tetracycline 05/24/2015  . UTI (lower urinary tract infection) 05/24/2015  . Emphysema of lung ([place]) 05/05/2015  . Rash, skin 05/05/2015  . Severe sepsis ([place]) 04/10/2015  . AKI (acute kidney injury) ([place]) 04/10/2015  . Hyponatremia 04/10/2015  . Sepsis due to pneumonia ([place])   . CAP (community acquired pneumonia) 04/09/2015  . Skin ulcer ([place]) 11/18/2014  . Musculoskeletal chest pain 10/25/2014  . Pain in the chest   . Tachycardia   . Anemia due to antineoplastic chemotherapy 10/21/2014  . Cancer of upper lobe of right lung ([place]) 10/07/2014  . Oral thrush 09/24/2014  . Nicotine dependence 09/24/2014  . Chronic neck pain 09/24/2014  . Excessive weight loss 09/24/2014  . 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 Bilateral lung cancer ([place])   . Paget's disease of bone   . Dysphagia 09/05/2014  . Essential hypertension 09/05/2014    Palliative Care Assessment & Plan   Patient Profile: 60 y.o. female  with past medical history of lung cancer with chemo and radiation in Fall 2016, dermatomyositis, prolonged steriod use,  CHF, steven-johnson disease, hypertension, emphysema, cerebral aneurysm, anemia, dysphagia with PEG placement in May, and recent admit for GI bleed due to duodenal ulcer admitted on 09/30/2015 with respiratory distress, increased mucus production in the setting of chronic hemoptysis, and generalized weakness. Recent admit from 9/5-9/7 with GI bleed and HCAP, sent home on Levaquin. Hemoglobin 8.4  on admit. Patient sent to stepdown and unfortunately went unresponsive-code called and patient intubated and transferred to ICU. At that time, hemoglobin  dropped to 3.5 and patient was urgently transfused 3 units PRBC. Extubated on 9/16 but required reintubation on 9/17. Patient then extubated 9/19 and there have been extensive conversations with patient and family regarding code status. Patient does not want to be re-intubated or resuscitated. DNR. GI consulted and closely following. Patient has had 2 EGD's this hospitalization. 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 Tagged RBC scan negative. Unclear of mild bleeding episode location but stable for now and GI continuing to follow. Patient receiving protonix BID. Palliative Medicine consultation for goals of care and possible hospice candidate.   Assessment: Acute hypoxemic respiratory failure HCAP Stage III lung cancer Acute renal failure Anemia Duodenal ulcer/dieulafoy lesion Dysphagia--chronic PEG End of life care  Recommendations/Plan: Start Morphine drip Continue NRB mask and tube feeds for now. Husband at bedside states that their children are struggling with the patient's decline and impending death. Family friend and minister present in the room plans on talking with the patient's children today regarding end of life Continue comfort measures  Code Status: DNR   Code Status Orders        Start     Ordered   10/12/15 1639  Do not attempt resuscitation (DNR)  Continuous    Question Answer Comment  In the event of cardiac or respiratory ARREST Do not call a "code blue"   In the event of cardiac or respiratory ARREST Do not perform Intubation, CPR, defibrillation or ACLS   In the event of cardiac or respiratory ARREST Use medication by any route, position, wound care, and other measures to relive pain and suffering. May use oxygen, suction and manual treatment of airway obstruction as needed for comfort.      10/12/15 1638    Code Status History    Date Active Date Inactive Code Status Order ID Comments User Context   10/07/2015  2:26 PM  10/12/2015  4:38 PM Full Code [id number]  Rush Landmark, MD Inpatient   10/02/2015  1:41 PM 10/07/2015  2:26 PM DNR [id number]  Rush Landmark, MD Inpatient   10/02/2015  8:50 AM 10/02/2015  1:41 PM Full Code [id number]  Rush Landmark, MD Inpatient   10/22/2015  2:37 PM 10/02/2015  8:50 AM Full Code [id number]  Rondel Jumbo, PA-C ED   09/28/2015  9:49 PM 09/30/2015  5:29 PM Full Code [id number]  Rise Patience, MD Inpatient   09/08/2015  3:06 PM 09/10/2015  5:26 PM Full Code [id number]  Donne Hazel, MD ED   08/07/2015  3:00 AM 08/13/2015  8:27 PM Full Code [id number]  Reubin Milan, MD Inpatient   07/02/2015 12:32 PM 07/08/2015  7:14 PM Full Code [id number]  Samuella Cota, MD Inpatient   06/15/2015  1:50 AM 06/17/2015  1:31 PM Full Code [id number]  Phillips Grout, MD Inpatient   05/25/2015 12:06 AM 06/01/2015 12:58 PM Full Code [id number]  Orvan Falconer, MD Inpatient   04/10/2015  3:34 AM 04/13/2015 12:32 PM Full Code [id number]  Vianne Bulls, MD Inpatient   10/25/2014  4:40 AM 10/28/2014  3:14 PM Full Code [id number]  Phillips Grout, MD Inpatient   09/05/2014  7:19 PM 09/12/2014  2:59 PM Full Code [id number]  Waldemar Dickens, MD ED    Advance Directive Documentation  Flowsheet Row Most Recent Value  Type of Advance Directive  Healthcare Power of Attorney  Pre-existing out of facility DNR order (yellow form or pink MOST form)  No data  "MOST" Form in Place?  No data       Prognosis:   hours to days, discussed with husband present in the room   Discharge Planning:  Anticipate hospital death   Care plan was discussed with   RN, husband,    Thank you for allowing the Palliative Medicine Team to assist in the care of this patient.   Time In: 9 Time Out: 935 Total Time 27mn Prolonged Time Billed  no       Greater than 50%  of this time was spent counseling and coordinating care related to the above assessment and plan.   ZLoistine ChanceMD [phone]    Please contact Palliative  Medicine Team phone at [phone] questions and concerns.

## 2015-10-24 NOTE — Progress Notes (Signed)
   30-Oct-2015 1620  Clinical Encounter Type  Visited With Patient and family together  Visit Type Follow-up  Referral From Chaplain  Spiritual Encounters  Spiritual Needs Prayer;Emotional  Stress Factors  Patient Stress Factors Not reviewed  Family Stress Factors Family relationships  Greeted family and offered prayer of support for Pt and family.

## 2015-10-24 DEATH — deceased

## 2016-01-01 ENCOUNTER — Other Ambulatory Visit: Payer: Self-pay | Admitting: Nurse Practitioner

## 2017-07-31 IMAGING — CR DG CHEST 1V PORT
1 series · 1 of 1 positions shown · non-contrast
Comparison: Radiograph October 01, 2015.

CLINICAL DATA: Sepsis.

EXAM:
PORTABLE CHEST 1 VIEW

[AP]
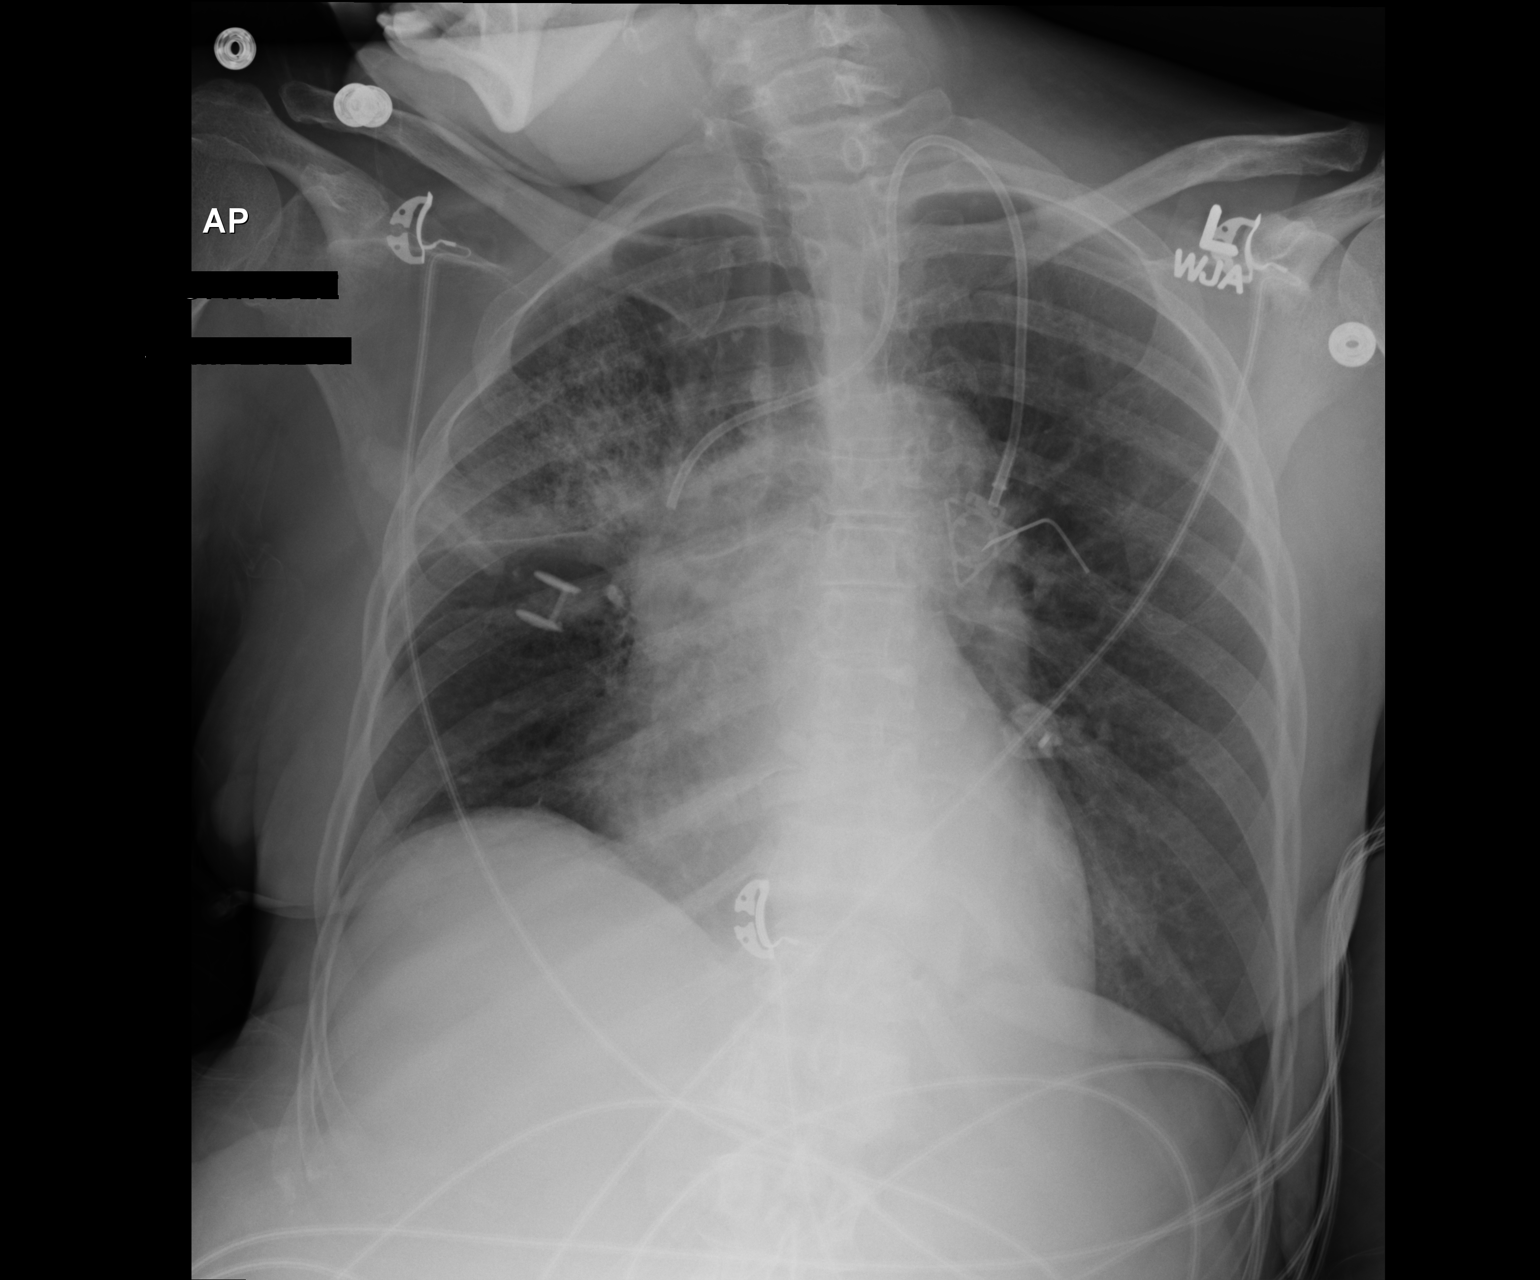

[1 of 1 positions shown; findings below may reference images not displayed]

FINDINGS: Stable cardiomediastinal silhouette. Left internal jugular
Port-A-Cath is unchanged in position. No pneumothorax is noted. Left
lung is clear. No pleural effusion is noted. Stable right upper lobe
opacity is noted in right hilar prominence is noted consistent with
history of lung cancer and possible associated atelectasis or
pneumonia. Bony thorax is unremarkable.
IMPRESSION: Stable right upper lobe opacity is described above.

## 2017-07-31 IMAGING — CR DG CHEST 1V PORT
1 series · 1 of 1 positions shown · non-contrast
Comparison: 10/02/2015, earlier the same day.

CLINICAL DATA: Central line and endotracheal tube placement.

EXAM:
PORTABLE CHEST 1 VIEW

[AP]
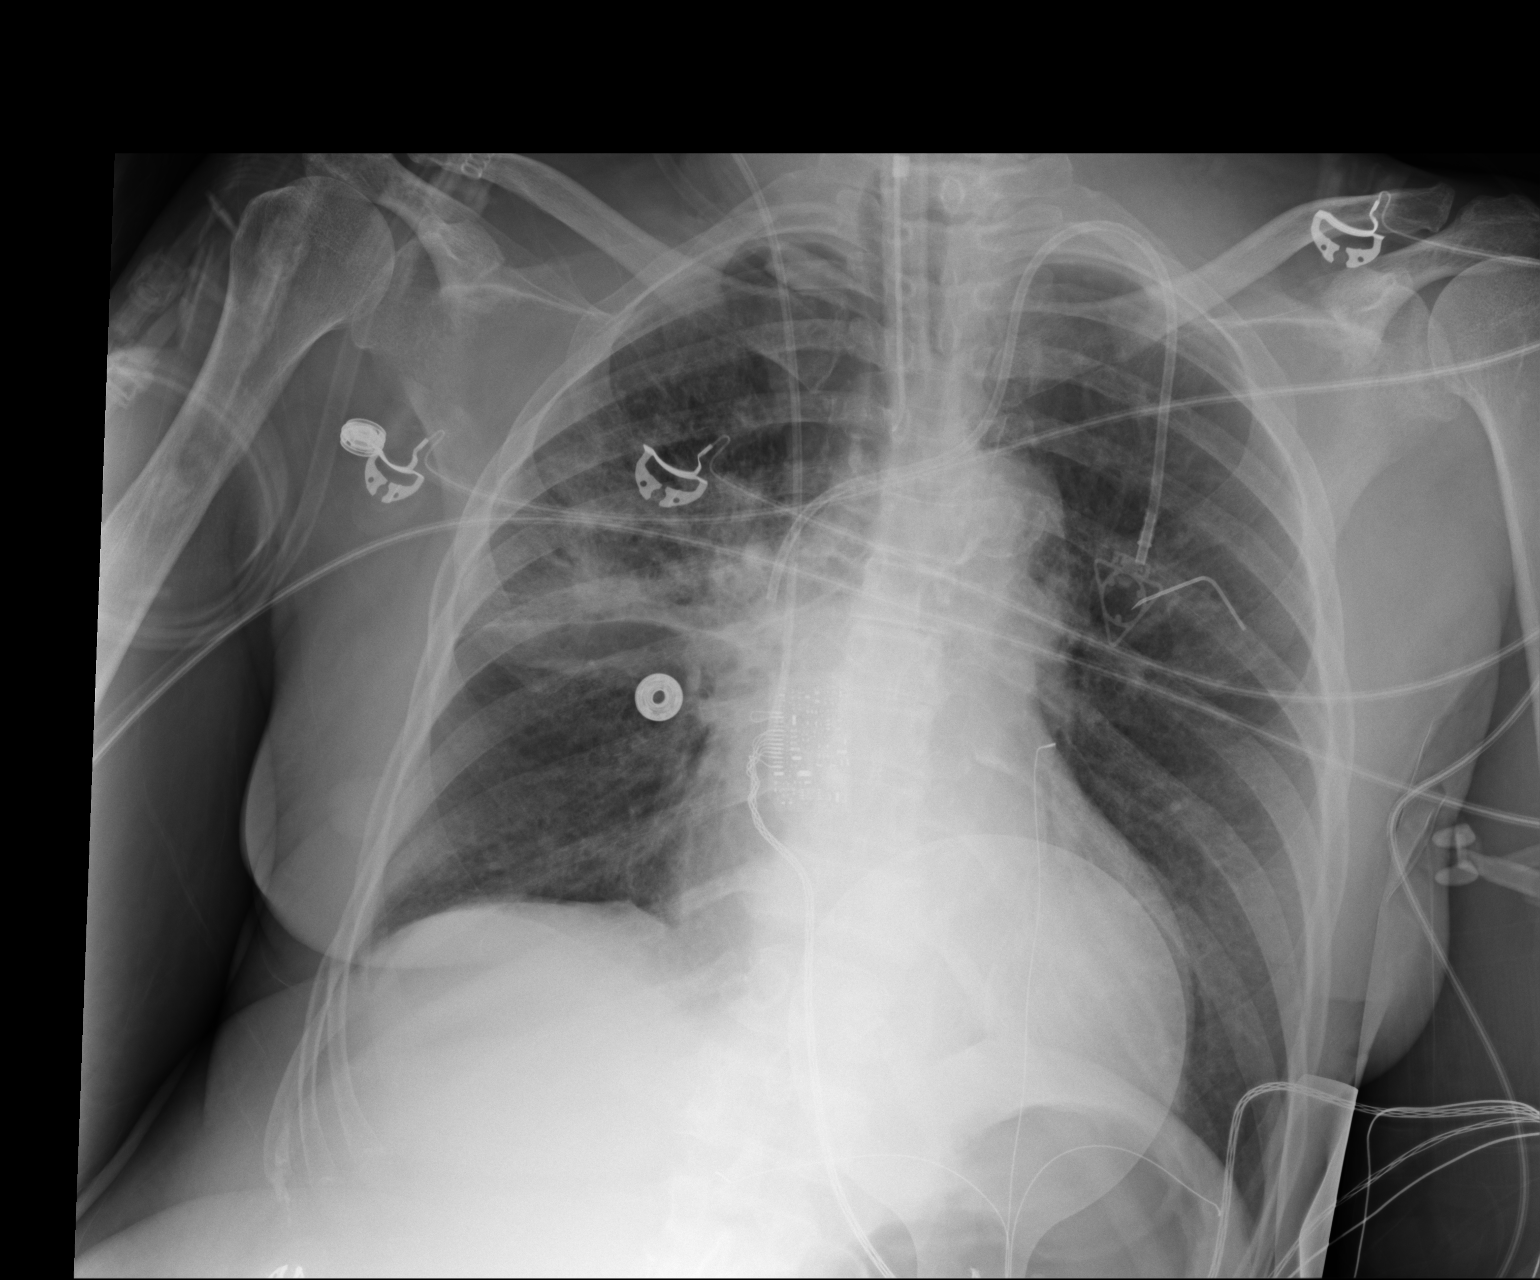

[1 of 1 positions shown; findings below may reference images not displayed]

FINDINGS: 8931 hours. Endotracheal tube tip is approximately 4.2 cm above the
base of the carina. Right IJ central line tip is obscured by
defibrillator device, but is in the region of the SVC/RA junction.
Left Port-A-Cath remains in place. Persistent right suprahilar
opacity compatible with pneumonia or neoplasm. Telemetry leads
overlie the chest.
IMPRESSION: 1. Endotracheal tube tip 4.2 cm above the base the chronic.
2. Right IJ central line tip obscured by defibrillator pad, but
positioned over the region of the distal SVC near the RA junction.

## 2017-08-03 IMAGING — DX DG CHEST 1V PORT
1 series · 1 of 1 positions shown · non-contrast
Comparison: 10/03/2015.

CLINICAL DATA: Respiratory failure.

EXAM:
PORTABLE CHEST 1 VIEW

[chest ap]
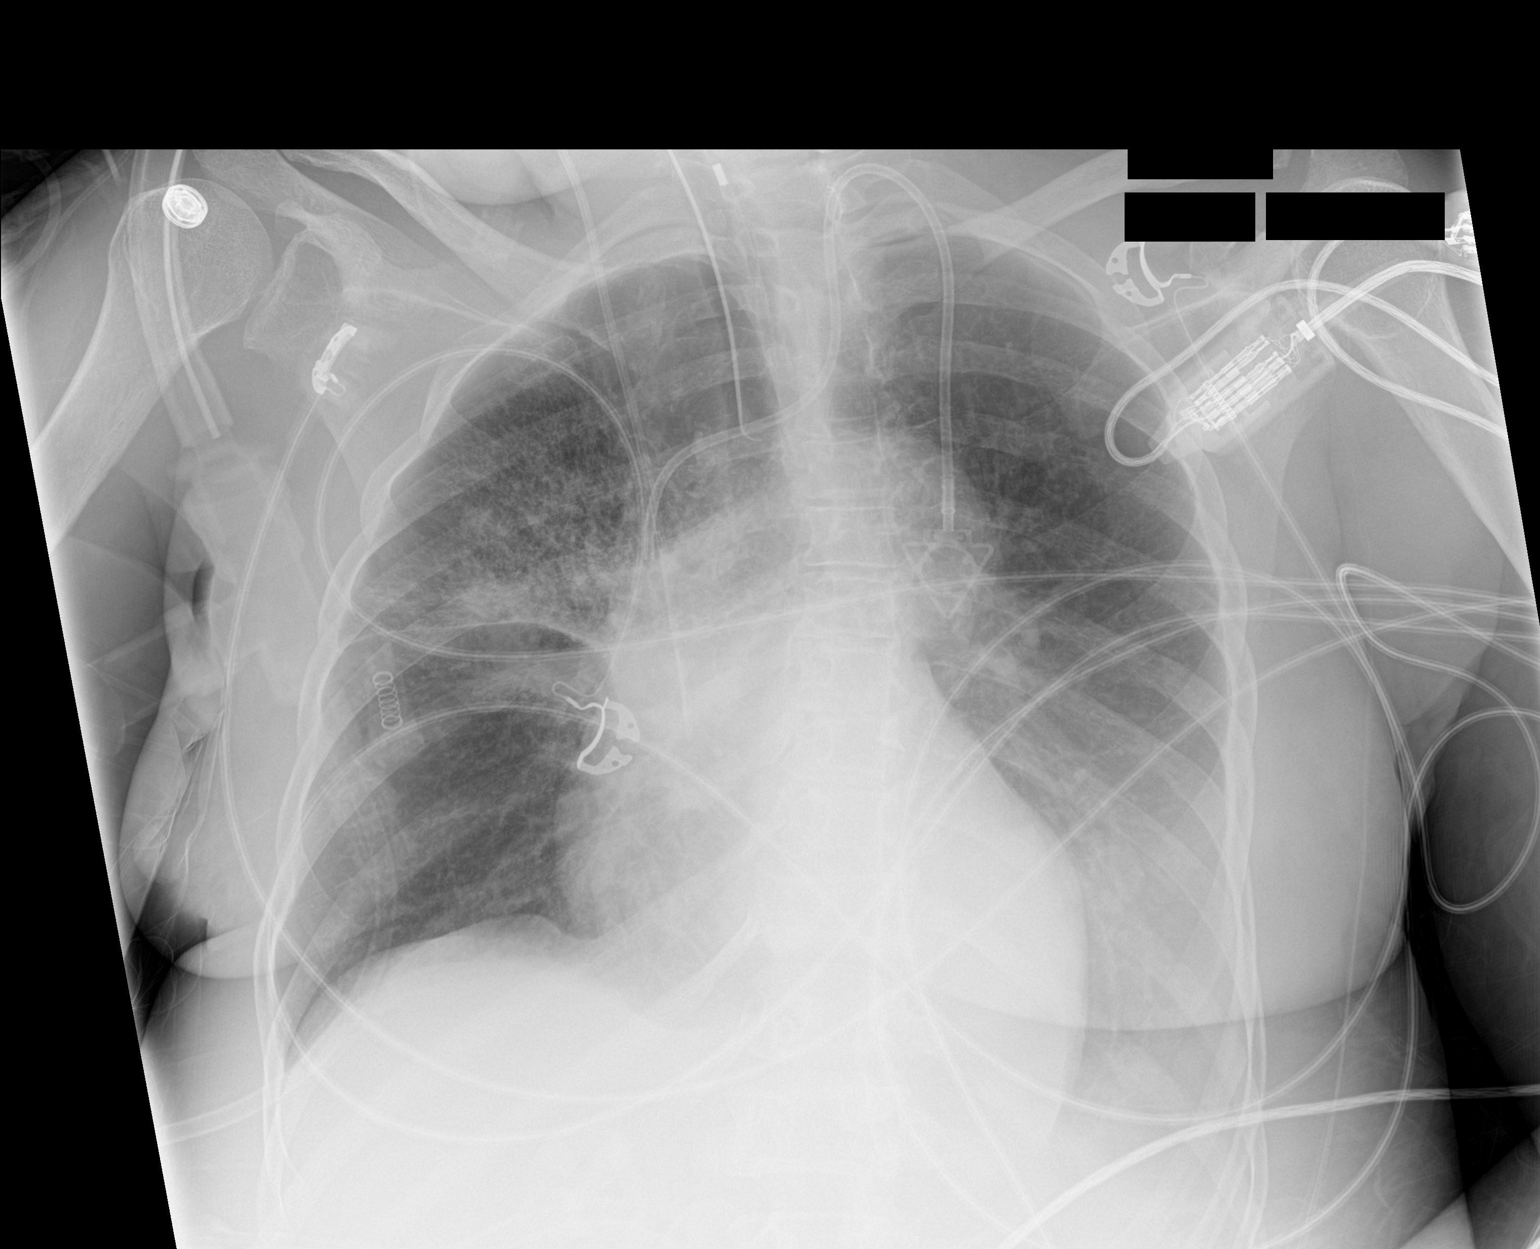

[1 of 1 positions shown; findings below may reference images not displayed]

FINDINGS: Rotated film is suboptimal. Unchanged tubes and lines. ET tube
cm above carina. RIGHT upper lobe infiltrate is stable. Unchanged
cardiomediastinal silhouette. Telemetry leads.
IMPRESSION: Stable chest.  RIGHT upper lobe infiltrate.

## 2017-08-08 IMAGING — CR DG CHEST 1V PORT
1 series · 1 of 1 positions shown · non-contrast
Comparison: 10/08/2015.

CLINICAL DATA: Pneumonia.

EXAM:
PORTABLE CHEST 1 VIEW

[AP]
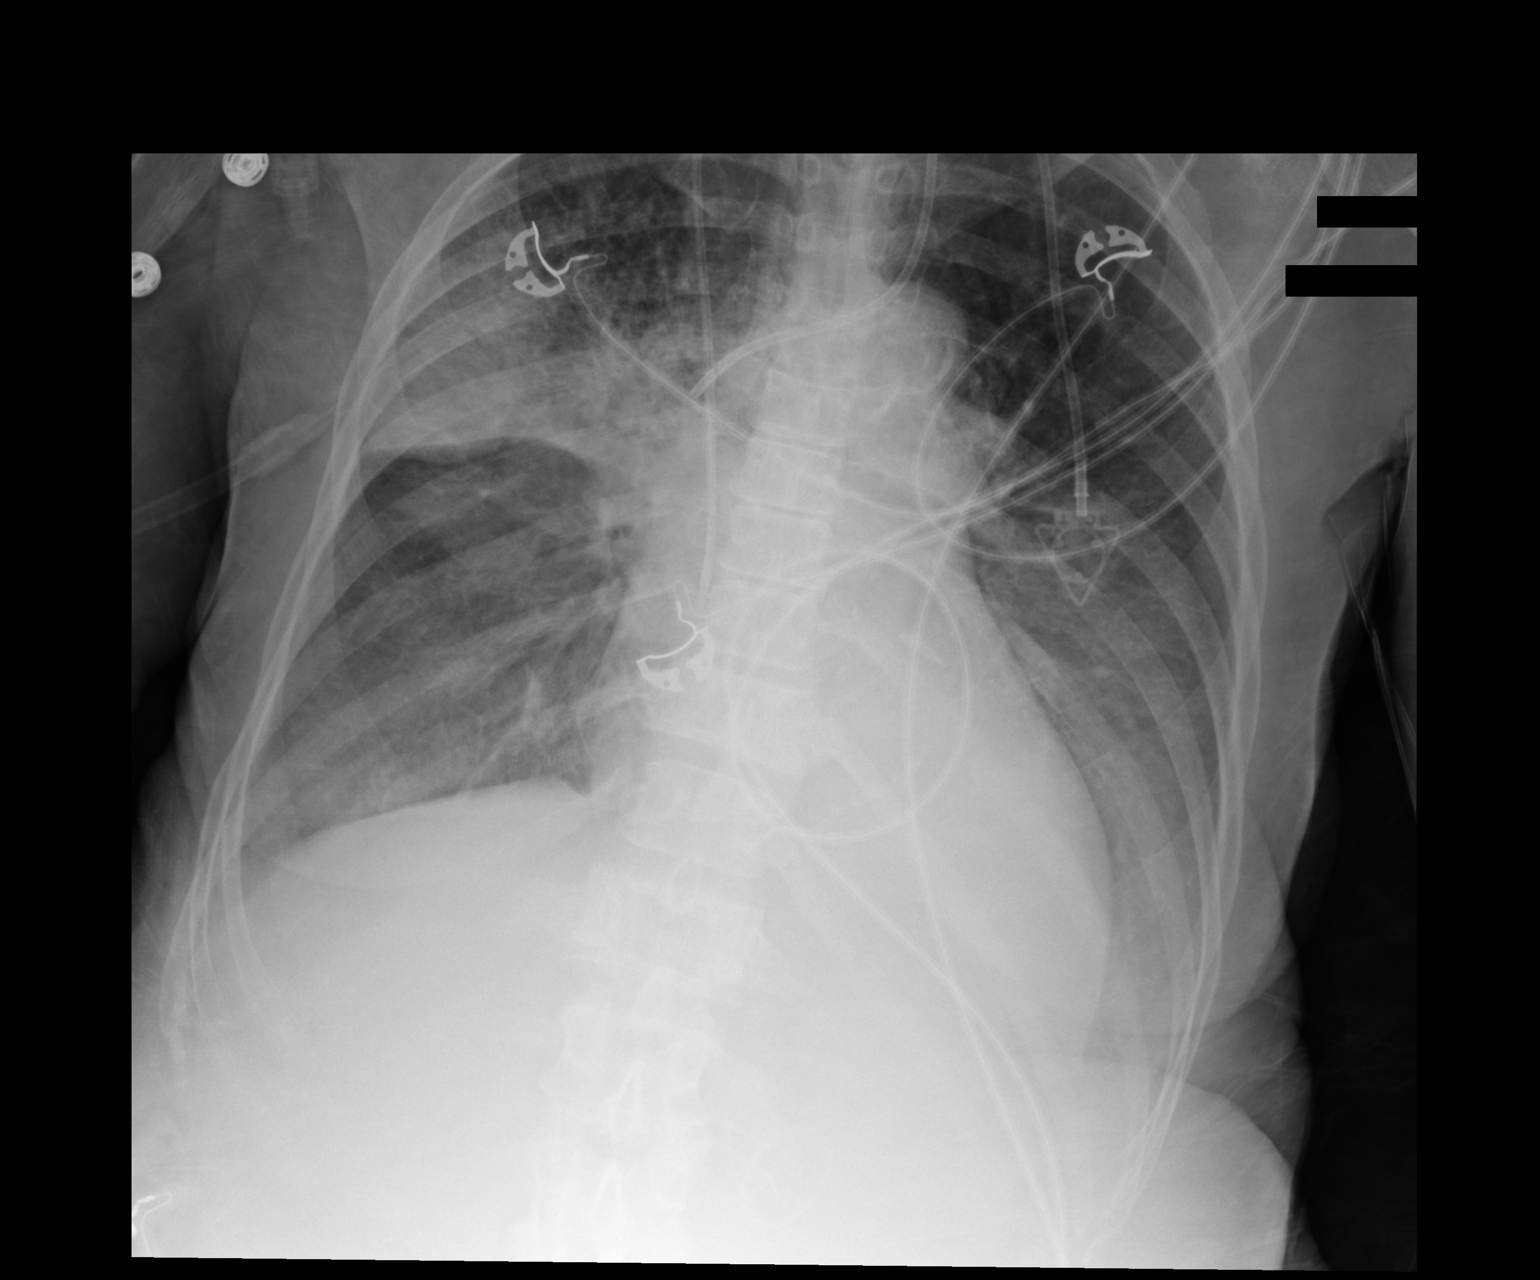

[1 of 1 positions shown; findings below may reference images not displayed]

FINDINGS: Further consolidation in the RIGHT upper lobe representing
pneumonia. Cardiomegaly stable. No pneumothorax. Port-A-Cath
unchanged. Central venous catheter tip unchanged. Endotracheal tube
has been removed. LEFT lower lobe atelectasis is also worse.
IMPRESSION: Worsening aeration.

## 2017-08-10 IMAGING — CR DG CHEST 1V PORT
1 series · 1 of 1 positions shown · non-contrast
Comparison: 10/11/2015

CLINICAL DATA: Go acute respiratory failure with hypoxemia

EXAM:
PORTABLE CHEST 1 VIEW

[AP]
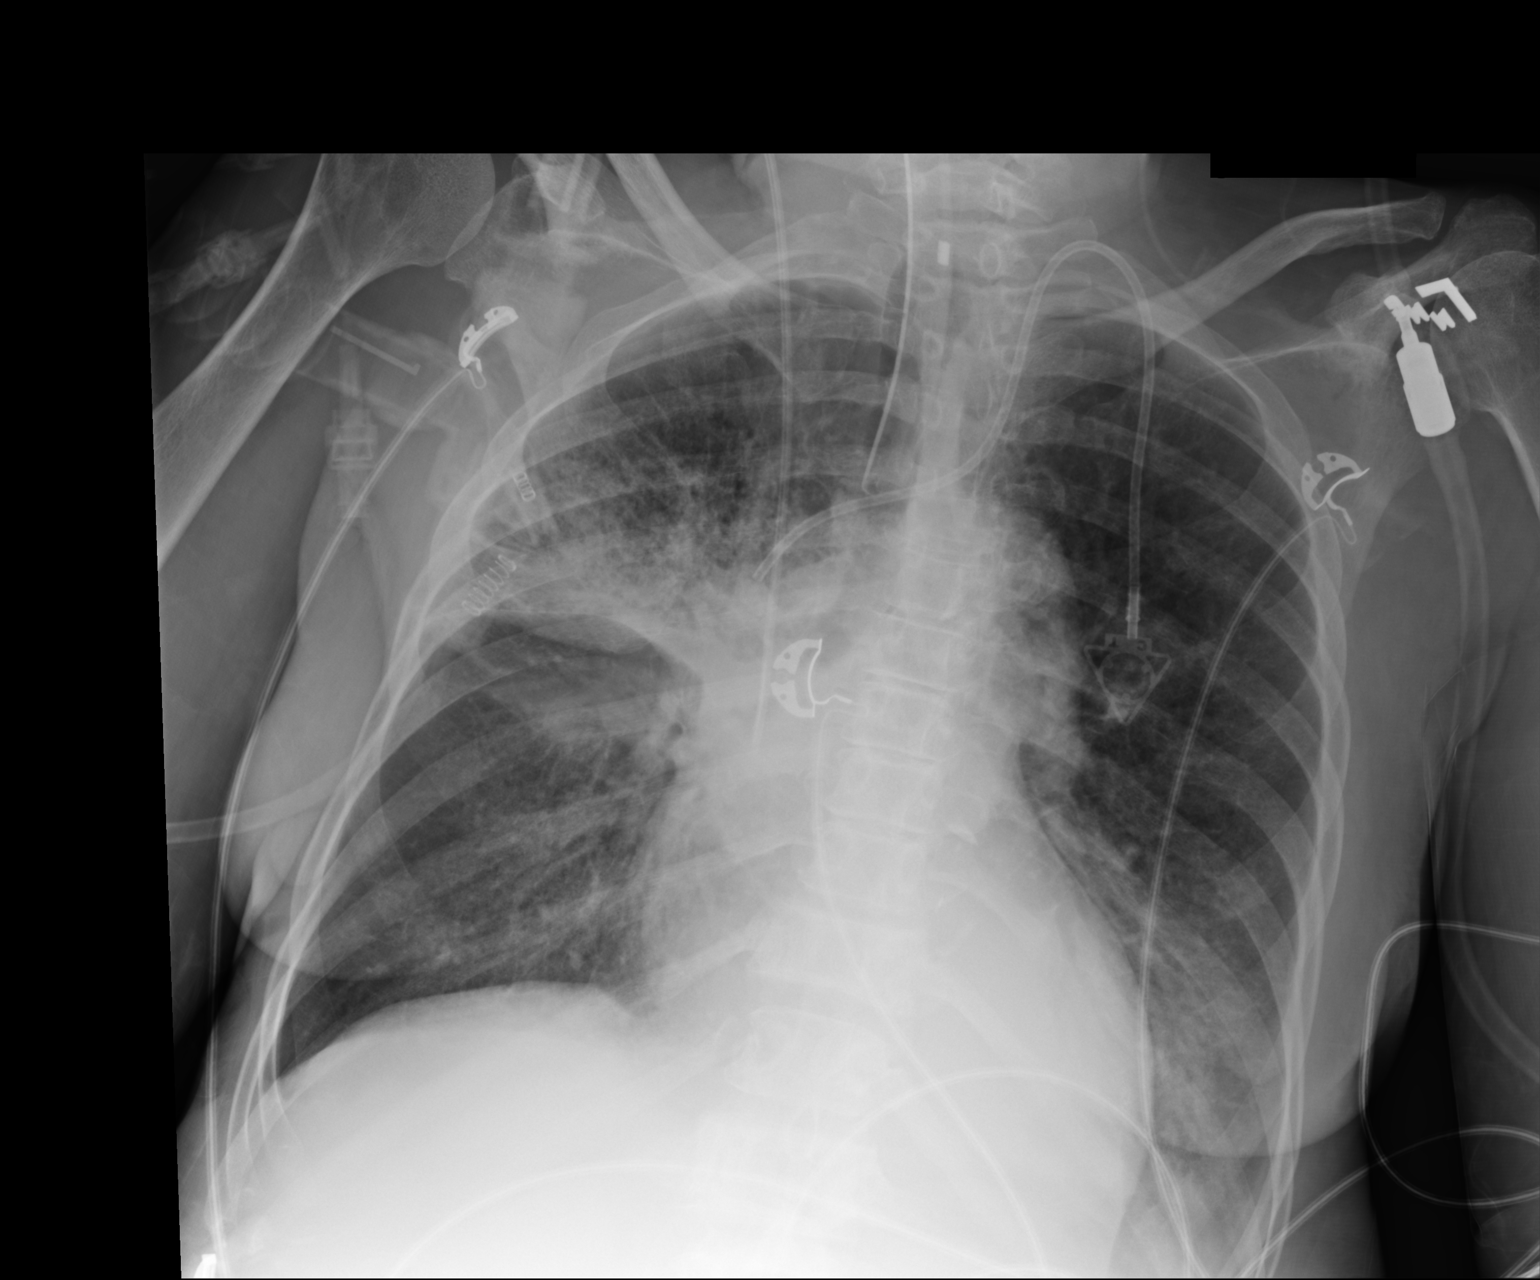

[1 of 1 positions shown; findings below may reference images not displayed]

FINDINGS: Support devices are stable. Consolidation in the right upper lobe is
again noted, stable. Increasing airspace disease in the left lower
lobe. Heart is normal size. No visible effusions. No acute bony
abnormality.
IMPRESSION: Continued right upper lobe consolidation. New airspace disease/
consolidation in the left lower lobe. Findings compatible with
multifocal pneumonia.
